# Patient Record
Sex: Female | Born: 1985 | Race: Black or African American | Hispanic: No | Marital: Single | Smoking: Current every day smoker
Health system: Southern US, Community
[De-identification: ages and names within clinical notes are randomized; demographics above are authoritative.]

## PROBLEM LIST (undated history)

## (undated) ENCOUNTER — Inpatient Hospital Stay (HOSPITAL_COMMUNITY): Payer: Self-pay

## (undated) DIAGNOSIS — F1410 Cocaine abuse, uncomplicated: Secondary | ICD-10-CM

## (undated) DIAGNOSIS — K56609 Unspecified intestinal obstruction, unspecified as to partial versus complete obstruction: Secondary | ICD-10-CM

## (undated) DIAGNOSIS — Z5189 Encounter for other specified aftercare: Secondary | ICD-10-CM

## (undated) DIAGNOSIS — E119 Type 2 diabetes mellitus without complications: Secondary | ICD-10-CM

## (undated) DIAGNOSIS — K650 Generalized (acute) peritonitis: Secondary | ICD-10-CM

## (undated) DIAGNOSIS — D649 Anemia, unspecified: Secondary | ICD-10-CM

## (undated) DIAGNOSIS — N83209 Unspecified ovarian cyst, unspecified side: Secondary | ICD-10-CM

## (undated) DIAGNOSIS — F419 Anxiety disorder, unspecified: Secondary | ICD-10-CM

## (undated) DIAGNOSIS — I2699 Other pulmonary embolism without acute cor pulmonale: Secondary | ICD-10-CM

## (undated) DIAGNOSIS — A419 Sepsis, unspecified organism: Secondary | ICD-10-CM

## (undated) DIAGNOSIS — K219 Gastro-esophageal reflux disease without esophagitis: Secondary | ICD-10-CM

## (undated) DIAGNOSIS — K589 Irritable bowel syndrome without diarrhea: Secondary | ICD-10-CM

## (undated) HISTORY — PX: OVARIAN CYST DRAINAGE: SHX325

## (undated) HISTORY — PX: BUNIONECTOMY: SHX129

## (undated) HISTORY — PX: SMALL BOWEL REPAIR: SHX6447

## (undated) HISTORY — PX: APPENDECTOMY: SHX54

---
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
## 2004-04-07 ENCOUNTER — Other Ambulatory Visit: Admission: RE | Admit: 2004-04-07 | Discharge: 2004-04-07 | Payer: Self-pay | Admitting: Internal Medicine

## 2006-08-29 ENCOUNTER — Inpatient Hospital Stay (HOSPITAL_COMMUNITY): Admission: AD | Admit: 2006-08-29 | Discharge: 2006-08-29 | Payer: Self-pay | Admitting: Gynecology

## 2006-11-28 ENCOUNTER — Inpatient Hospital Stay (HOSPITAL_COMMUNITY): Admission: AD | Admit: 2006-11-28 | Discharge: 2006-11-28 | Payer: Self-pay | Admitting: Obstetrics & Gynecology

## 2013-10-18 ENCOUNTER — Inpatient Hospital Stay (HOSPITAL_COMMUNITY): Payer: Self-pay

## 2013-10-18 ENCOUNTER — Encounter (HOSPITAL_COMMUNITY): Payer: Self-pay | Admitting: Family

## 2013-10-18 ENCOUNTER — Inpatient Hospital Stay (HOSPITAL_COMMUNITY)
Admission: AD | Admit: 2013-10-18 | Discharge: 2013-10-18 | Disposition: A | Discharge status: Home / Self Care | Payer: Medicaid Other | Source: Ambulatory Visit | Attending: Obstetrics & Gynecology | Admitting: Obstetrics & Gynecology

## 2013-10-18 DIAGNOSIS — N949 Unspecified condition associated with female genital organs and menstrual cycle: Secondary | ICD-10-CM | POA: Insufficient documentation

## 2013-10-18 DIAGNOSIS — R1032 Left lower quadrant pain: Secondary | ICD-10-CM | POA: Insufficient documentation

## 2013-10-18 DIAGNOSIS — A499 Bacterial infection, unspecified: Secondary | ICD-10-CM | POA: Insufficient documentation

## 2013-10-18 DIAGNOSIS — B9689 Other specified bacterial agents as the cause of diseases classified elsewhere: Secondary | ICD-10-CM | POA: Insufficient documentation

## 2013-10-18 DIAGNOSIS — N760 Acute vaginitis: Secondary | ICD-10-CM

## 2013-10-18 DIAGNOSIS — Z8742 Personal history of other diseases of the female genital tract: Secondary | ICD-10-CM

## 2013-10-18 HISTORY — DX: Unspecified ovarian cyst, unspecified side: N83.209

## 2013-10-18 HISTORY — DX: Generalized (acute) peritonitis: K65.0

## 2013-10-18 HISTORY — DX: Sepsis, unspecified organism: A41.9

## 2013-10-18 LAB — RAPID URINE DRUG SCREEN, HOSP PERFORMED
Barbiturates: NOT DETECTED
Benzodiazepines: NOT DETECTED
Cocaine: NOT DETECTED
Opiates: NOT DETECTED
Tetrahydrocannabinol: POSITIVE — AB

## 2013-10-18 LAB — CBC
HCT: 34.5 % — ABNORMAL LOW (ref 36.0–46.0)
MCHC: 33 g/dL (ref 30.0–36.0)
RDW: 13.3 % (ref 11.5–15.5)

## 2013-10-18 LAB — URINALYSIS, ROUTINE W REFLEX MICROSCOPIC
Glucose, UA: NEGATIVE mg/dL
Hgb urine dipstick: NEGATIVE
Ketones, ur: NEGATIVE mg/dL
Leukocytes, UA: NEGATIVE
Protein, ur: NEGATIVE mg/dL
Specific Gravity, Urine: >1.03 — ABNORMAL HIGH (ref 1.005–1.030)
Urobilinogen, UA: 0.2 mg/dL (ref 0.0–1.0)
pH: 6 (ref 5.0–8.0)

## 2013-10-18 LAB — WET PREP, GENITAL
Trich, Wet Prep: NONE SEEN
Yeast Wet Prep HPF POC: NONE SEEN

## 2013-10-18 LAB — POCT PREGNANCY, URINE: Preg Test, Ur: NEGATIVE

## 2013-10-18 MED ORDER — HYDROCODONE-ACETAMINOPHEN 10-325 MG PO TABS: 1.0000 | ORAL_TABLET | Freq: Four times a day (QID) | ORAL | Status: DC | PRN

## 2013-10-18 MED ORDER — TRAMADOL HCL 50 MG PO TABS: 100.0000 mg | ORAL_TABLET | Freq: Once | ORAL | Status: DC

## 2013-10-18 MED ORDER — OXYCODONE-ACETAMINOPHEN 5-325 MG PO TABS
1.0000 | ORAL_TABLET | Freq: Once | ORAL | Status: AC
  Administered 2013-10-18: 1 via ORAL
  Filled 2013-10-18: qty 1

## 2013-10-18 MED ORDER — KETOROLAC TROMETHAMINE 60 MG/2ML IM SOLN
60.0000 mg | Freq: Once | INTRAMUSCULAR | Status: AC
  Administered 2013-10-18: 60 mg via INTRAMUSCULAR
  Filled 2013-10-18: qty 2

## 2013-10-18 MED ORDER — IBUPROFEN 800 MG PO TABS: 800.0000 mg | ORAL_TABLET | Freq: Three times a day (TID) | ORAL | Status: DC | PRN

## 2013-10-18 MED ORDER — DICYCLOMINE HCL 20 MG PO TABS: 20.0000 mg | ORAL_TABLET | Freq: Three times a day (TID) | ORAL | Status: DC

## 2013-10-18 MED ORDER — DICYCLOMINE HCL 10 MG PO CAPS
20.0000 mg | ORAL_CAPSULE | Freq: Once | ORAL | Status: AC
  Administered 2013-10-18: 20 mg via ORAL
  Filled 2013-10-18: qty 2

## 2013-10-18 MED ORDER — HYDROMORPHONE HCL PF 2 MG/ML IJ SOLN
2.0000 mg | Freq: Once | INTRAMUSCULAR | Status: AC
  Administered 2013-10-18: 2 mg via INTRAMUSCULAR
  Filled 2013-10-18: qty 1

## 2013-10-18 MED ORDER — METRONIDAZOLE 500 MG PO TABS: 500.0000 mg | ORAL_TABLET | Freq: Two times a day (BID) | ORAL | Status: DC

## 2013-10-18 NOTE — MAU Note (Addendum)
27 yo, LMP 12/15, presents to MAU with c/o LLQ pain progressively worsening today from yesterday. Reports hx of L ovarian cyst, which was being monitored every 2-3 weeks when patient lived in [place]. Moved to [place] in last 3 weeks; has not established care here. Reports she is out of pain medication; pain unrelieved by Tylenol. Last medication: Tylenol 1500mg  at 2000 last night. Denies fever, chills. Reports one emesis occurrence last night after taking Tylenol.

## 2013-10-18 NOTE — MAU Note (Addendum)
Discussed smoking cessation; patient expressed readiness to quit smoking at first of year. Reports she only began smoking again 3 months ago and family is supportive of her trying to quit.  Encouraged patient in her plan to quit.

## 2013-10-18 NOTE — MAU Provider Note (Signed)
History     CSN: [id number]  Arrival date and time: 10/18/13 1510   None     No chief complaint on file.  HPI  Ms. Jodi Mcgee is a 27 y.o. female G2P2 who presents with left lower pelvic pain. She had a hemorraghic cyst in August; they aspirated the blood and told her to follow up with her primary care Dr. She followed up with Dr. Julien Girt in [place] following the procedure. He told her that they would continue to monitor it every 3 weeks. She has a significant abdominal history including a ruptured appendix that lead to sepsis; pt has been told she has a large amount of scar tissue throughout her abdomen. She has recently moved from [place] to [place] back to [facility]. She has not established care with any provider here in [place].  She currently rates her pain 9/10; took tylenol without much relief. She has not tried ibuprofen because she knows it wont work.   OB History   Grav Para Term Preterm Abortions TAB SAB Ect Mult Living   2 2        2       Past Medical History  Diagnosis Date  . Ovarian cyst   . Peritonitis, acute generalized   . Sepsis     Past Surgical History  Procedure Laterality Date  . Cesarean section    . Ovarian cyst drainage    . Appendectomy      ruptured     History reviewed. No pertinent family history.  History  Substance Use Topics  . Smoking status: Current Every Day Smoker -- 0.50 packs/day    Types: Cigarettes  . Smokeless tobacco: Never Used  . Alcohol Use: No    Allergies: No Known Allergies  Prescriptions prior to admission  Medication Sig Dispense Refill  . acetaminophen (TYLENOL) 500 MG tablet Take 1,500 mg by mouth every 6 (six) hours as needed for moderate pain.      Marland Kitchen HYDROcodone-acetaminophen (NORCO) 10-325 MG per tablet Take 1 tablet by mouth every 6 (six) hours as needed for moderate pain.      Marland Kitchen ondansetron (ZOFRAN) 4 MG tablet Take 4 mg by mouth every 8 (eight) hours as needed for nausea or vomiting.       Results for  orders placed during the hospital encounter of 10/18/13 (from the past 24 hour(s))  URINALYSIS, ROUTINE W REFLEX MICROSCOPIC     Status: Abnormal   Collection Time    10/18/13  3:55 PM      Result Value Range   Color, Urine YELLOW  YELLOW   APPearance CLEAR  CLEAR   Specific Gravity, Urine >1.030 (*) 1.005 - 1.030   pH 6.0  5.0 - 8.0   Glucose, UA NEGATIVE  NEGATIVE mg/dL   Hgb urine dipstick NEGATIVE  NEGATIVE   Bilirubin Urine NEGATIVE  NEGATIVE   Ketones, ur NEGATIVE  NEGATIVE mg/dL   Protein, ur NEGATIVE  NEGATIVE mg/dL   Urobilinogen, UA 0.2  0.0 - 1.0 mg/dL   Nitrite NEGATIVE  NEGATIVE   Leukocytes, UA NEGATIVE  NEGATIVE  POCT PREGNANCY, URINE     Status: None   Collection Time    10/18/13  4:09 PM      Result Value Range   Preg Test, Ur NEGATIVE  NEGATIVE  WET PREP, GENITAL     Status: Abnormal   Collection Time    10/18/13  5:45 PM      Result Value Range   Yeast  Wet Prep HPF POC NONE SEEN  NONE SEEN   Trich, Wet Prep NONE SEEN  NONE SEEN   Clue Cells Wet Prep HPF POC MODERATE (*) NONE SEEN   WBC, Wet Prep HPF POC FEW (*) NONE SEEN    Review of Systems  Constitutional: Negative for fever and chills.  Gastrointestinal: Positive for nausea, vomiting and abdominal pain.       + left lower quadrant pain.  Genitourinary: Negative for dysuria, urgency, frequency and hematuria.       + vaginal dischargel white discharge  No vaginal bleeding. No dysuria.   Musculoskeletal: Positive for back pain.   Physical Exam   Blood pressure 119/57, pulse 89, temperature 98 F (36.7 C), temperature source Oral, resp. rate 20, height 5\' 6"  (1.676 m), weight 99.791 kg (220 lb), last menstrual period 10/06/2013.  Physical Exam  Constitutional: She is oriented to person, place, and time. She appears well-developed and well-nourished. No distress.  HENT:  Head: Normocephalic.  Eyes: Pupils are equal, round, and reactive to light.  Neck: Neck supple.  GI: Soft. Normal appearance.  There is tenderness in the suprapubic area and left lower quadrant. There is no rigidity, no rebound and no guarding.  Genitourinary:  Speculum exam: Vagina - Small amount of creamy discharge, moderate odor Cervix - No contact bleeding Bimanual exam: Cervix closed, mild CMT  Uterus non tender, normal size Left adnexal tenderness, + suprapubic tenderness, no masses bilaterally GC/Chlam, wet prep done Chaperone present for exam.   Neurological: She is alert and oriented to person, place, and time.  Skin: Skin is warm. She is not diaphoretic.  Psychiatric: Her behavior is normal.    MAU Course  Procedures None  MDM Toradol 60 mg IM Pt says when she has gone to the ER in the past she gotten dilaudid and morphine. I explained to the patient that Toradol is a great non-narcotic drug that works well for pelvic pain.  Pt requests something stronger for pain; percocet 1 tab given Pelvic US complete UDS 1900: pt rates her pain the same; no relief from Toradol or percocet. Pt is requesting a dose of dilaudid.   Assessment and Plan  Report given to [place] CNM who assumes care of the patient   Debbrah Alar 10/18/2013, 4:57 PM   Dorathy Kinsman, CNM assumed care of pt at 2000. Pt in [place].   Results for orders placed during the hospital encounter of 10/18/13 (from the past 24 hour(s))  URINALYSIS, ROUTINE W REFLEX MICROSCOPIC     Status: Abnormal   Collection Time    10/18/13  3:55 PM      Result Value Range   Color, Urine YELLOW  YELLOW   APPearance CLEAR  CLEAR   Specific Gravity, Urine >1.030 (*) 1.005 - 1.030   pH 6.0  5.0 - 8.0   Glucose, UA NEGATIVE  NEGATIVE mg/dL   Hgb urine dipstick NEGATIVE  NEGATIVE   Bilirubin Urine NEGATIVE  NEGATIVE   Ketones, ur NEGATIVE  NEGATIVE mg/dL   Protein, ur NEGATIVE  NEGATIVE mg/dL   Urobilinogen, UA 0.2  0.0 - 1.0 mg/dL   Nitrite NEGATIVE  NEGATIVE   Leukocytes, UA NEGATIVE  NEGATIVE  URINE RAPID DRUG SCREEN (HOSP  PERFORMED)     Status: Abnormal   Collection Time    10/18/13  3:55 PM      Result Value Range   Opiates NONE DETECTED  NONE DETECTED   Cocaine NONE DETECTED  NONE DETECTED  Benzodiazepines NONE DETECTED  NONE DETECTED   Amphetamines NONE DETECTED  NONE DETECTED   Tetrahydrocannabinol POSITIVE (*) NONE DETECTED   Barbiturates NONE DETECTED  NONE DETECTED  POCT PREGNANCY, URINE     Status: None   Collection Time    10/18/13  4:09 PM      Result Value Range   Preg Test, Ur NEGATIVE  NEGATIVE  WET PREP, GENITAL     Status: Abnormal   Collection Time    10/18/13  5:45 PM      Result Value Range   Yeast Wet Prep HPF POC NONE SEEN  NONE SEEN   Trich, Wet Prep NONE SEEN  NONE SEEN   Clue Cells Wet Prep HPF POC MODERATE (*) NONE SEEN   WBC, Wet Prep HPF POC FEW (*) NONE SEEN  CBC     Status: Abnormal   Collection Time    10/18/13  8:40 PM      Result Value Range   WBC 4.9  4.0 - 10.5 K/uL   RBC 4.18  3.87 - 5.11 MIL/uL   Hemoglobin 11.4 (*) 12.0 - 15.0 g/dL   HCT 16.1 (*) 09.6 - 04.5 %   MCV 82.5  78.0 - 100.0 fL   MCH 27.3  26.0 - 34.0 pg   MCHC 33.0  30.0 - 36.0 g/dL   RDW 40.9  81.1 - 91.4 %   Platelets 264  150 - 400 K/uL    Discussed uncertain Gyn etiology for pain, but no clear Gyn cause. Per consult w/ Dr.Leggett may give Dilaudid x 1 in MAU and F/U w/ ED for ongoing problems. Will also give Bentyl due to location of pain and Hx IBS. Will Tx BV. Pt agreeable.  Assessment:  1. LLQ abdominal pain   2. Hx of ovarian cyst   3. BV (bacterial vaginosis)    Plan: D/C home in stable condition per consult w/ Dr Penne Lash. Comfort measures. List of Primary care and Gyn providers given. Follow-up Information   Follow up with Primary care provider. (As needed if symptoms worsen)       Follow up with [facility]. (As needed in emergencies)    Contact information:   [address] [place] [address]        Medication List         acetaminophen 500 MG tablet   Commonly known as:  TYLENOL  Take 1,500 mg by mouth every 6 (six) hours as needed for moderate pain.     dicyclomine 20 MG tablet  Commonly known as:  BENTYL  Take 1 tablet (20 mg total) by mouth 4 (four) times daily -  before meals and at bedtime.     HYDROcodone-acetaminophen 10-325 MG per tablet  Commonly known as:  NORCO  Take 1 tablet by mouth every 6 (six) hours as needed for moderate pain.     ibuprofen 800 MG tablet  Commonly known as:  ADVIL,MOTRIN  Take 1 tablet (800 mg total) by mouth every 8 (eight) hours as needed for moderate pain. Take on schedule for 48 hours.     metroNIDAZOLE 500 MG tablet  Commonly known as:  FLAGYL  Take 1 tablet (500 mg total) by mouth 2 (two) times daily.     ondansetron 4 MG tablet  Commonly known as:  ZOFRAN  Take 4 mg by mouth every 8 (eight) hours as needed for nausea or vomiting.       [place], [place] 10/18/2013 9:53 PM

## 2013-10-18 NOTE — MAU Note (Signed)
Pt states here for left sided lower abdominal pain. Has had ovarian cyst to rupture on left side. Constant pain since yesterday that has gotten severe. Since having ruptured appendix and ovarian abscesses, pt states she has had pain and problems with ovaries.

## 2013-10-20 ENCOUNTER — Emergency Department (HOSPITAL_COMMUNITY): Payer: Self-pay

## 2013-10-20 ENCOUNTER — Emergency Department (HOSPITAL_COMMUNITY)
Admission: EM | Admit: 2013-10-20 | Discharge: 2013-10-20 | Disposition: A | Discharge status: Home / Self Care | Payer: Self-pay | Attending: Emergency Medicine | Admitting: Emergency Medicine

## 2013-10-20 ENCOUNTER — Encounter (HOSPITAL_COMMUNITY): Payer: Self-pay | Admitting: Emergency Medicine

## 2013-10-20 DIAGNOSIS — Z8742 Personal history of other diseases of the female genital tract: Secondary | ICD-10-CM | POA: Insufficient documentation

## 2013-10-20 DIAGNOSIS — F17200 Nicotine dependence, unspecified, uncomplicated: Secondary | ICD-10-CM | POA: Insufficient documentation

## 2013-10-20 DIAGNOSIS — Z79899 Other long term (current) drug therapy: Secondary | ICD-10-CM | POA: Insufficient documentation

## 2013-10-20 DIAGNOSIS — Z8619 Personal history of other infectious and parasitic diseases: Secondary | ICD-10-CM | POA: Insufficient documentation

## 2013-10-20 DIAGNOSIS — R197 Diarrhea, unspecified: Secondary | ICD-10-CM | POA: Insufficient documentation

## 2013-10-20 DIAGNOSIS — Z3202 Encounter for pregnancy test, result negative: Secondary | ICD-10-CM | POA: Insufficient documentation

## 2013-10-20 DIAGNOSIS — R141 Gas pain: Secondary | ICD-10-CM | POA: Insufficient documentation

## 2013-10-20 DIAGNOSIS — Z9889 Other specified postprocedural states: Secondary | ICD-10-CM | POA: Insufficient documentation

## 2013-10-20 DIAGNOSIS — R142 Eructation: Secondary | ICD-10-CM | POA: Insufficient documentation

## 2013-10-20 DIAGNOSIS — K5900 Constipation, unspecified: Secondary | ICD-10-CM | POA: Insufficient documentation

## 2013-10-20 DIAGNOSIS — R1110 Vomiting, unspecified: Secondary | ICD-10-CM | POA: Insufficient documentation

## 2013-10-20 LAB — BASIC METABOLIC PANEL
CO2: 25 mEq/L (ref 19–32)
GFR calc Af Amer: >90 mL/min (ref >90)
GFR calc non Af Amer: >90 mL/min (ref >90)
Glucose, Bld: 91 mg/dL (ref 70–99)
Potassium: 4.1 mEq/L (ref 3.5–5.1)
Sodium: 134 mEq/L — ABNORMAL LOW (ref 135–145)

## 2013-10-20 LAB — POCT PREGNANCY, URINE: Preg Test, Ur: NEGATIVE

## 2013-10-20 LAB — URINALYSIS, ROUTINE W REFLEX MICROSCOPIC
Glucose, UA: NEGATIVE mg/dL
Leukocytes, UA: NEGATIVE
Nitrite: NEGATIVE
Protein, ur: NEGATIVE mg/dL
Specific Gravity, Urine: 1.021 (ref 1.005–1.030)
Urobilinogen, UA: 1 mg/dL (ref 0.0–1.0)

## 2013-10-20 LAB — HEPATIC FUNCTION PANEL
Albumin: 3.5 g/dL (ref 3.5–5.2)
Alkaline Phosphatase: 67 U/L (ref 39–117)
Bilirubin, Direct: <0.1 mg/dL (ref 0.0–0.3)
Total Bilirubin: 0.2 mg/dL — ABNORMAL LOW (ref 0.3–1.2)

## 2013-10-20 LAB — CBC WITH DIFFERENTIAL/PLATELET
Basophils Absolute: 0 10*3/uL (ref 0.0–0.1)
Eosinophils Absolute: 0.1 10*3/uL (ref 0.0–0.7)
Lymphocytes Relative: 30 % (ref 12–46)
Lymphs Abs: 1.5 10*3/uL (ref 0.7–4.0)
MCH: 26.8 pg (ref 26.0–34.0)
MCV: 83.3 fL (ref 78.0–100.0)
Neutrophils Relative %: 61 % (ref 43–77)
Platelets: 235 10*3/uL (ref 150–400)
RBC: 4.37 MIL/uL (ref 3.87–5.11)
WBC: 5 10*3/uL (ref 4.0–10.5)

## 2013-10-20 LAB — GC/CHLAMYDIA PROBE AMP: CT Probe RNA: NEGATIVE

## 2013-10-20 LAB — LIPASE, BLOOD: Lipase: 26 U/L (ref 11–59)

## 2013-10-20 MED ORDER — BISACODYL 10 MG RE SUPP
20.0000 mg | Freq: Once | RECTAL | Status: AC
  Administered 2013-10-20: 20 mg via RECTAL
  Filled 2013-10-20: qty 2

## 2013-10-20 MED ORDER — DOCUSATE SODIUM 100 MG PO CAPS: 100.0000 mg | ORAL_CAPSULE | Freq: Two times a day (BID) | ORAL | Status: DC

## 2013-10-20 MED ORDER — HYDROMORPHONE HCL PF 1 MG/ML IJ SOLN
1.0000 mg | Freq: Once | INTRAMUSCULAR | Status: AC
  Administered 2013-10-20: 1 mg via INTRAVENOUS
  Filled 2013-10-20: qty 1

## 2013-10-20 MED ORDER — FLEET ENEMA 7-19 GM/118ML RE ENEM
1.0000 | ENEMA | Freq: Once | RECTAL | Status: AC
  Administered 2013-10-20: 1 via RECTAL
  Filled 2013-10-20: qty 1

## 2013-10-20 MED ORDER — POLYETHYLENE GLYCOL 3350 17 GM/SCOOP PO POWD: 17.0000 g | Freq: Three times a day (TID) | ORAL | Status: DC

## 2013-10-20 NOTE — ED Notes (Signed)
Patient transported to X-ray 

## 2013-10-20 NOTE — ED Notes (Signed)
Pt presents with c/o abdominal pain that started three days ago. Pt was seen at [facility] hospital for the same two days ago. Pt says she had abdominal surgery last year and ever since then has had some intestinal problems. Pt says she has had nausea and vomiting and some diarrhea.

## 2013-10-20 NOTE — ED Provider Notes (Signed)
CSN: [id number]     Arrival date & time 10/20/13  1453 History   First MD Initiated Contact with Patient 10/20/13 1746     Chief Complaint  Patient presents with  . Abdominal Pain   (Consider location/radiation/quality/duration/timing/severity/associated sxs/prior Treatment) HPI 27 yo female presents with generalized abdominal pain x 3-4 days. Patient states pain is constant at a 7/10 with sharp 9/10 pain that occurs at regular 20-30 min intervals. Pain is worse with twisting, improved when she lays on right side. Pain also worsened with food. Patient tried Norco and Ibuprofen with no relief of sxs. Admits to 2 episodes of vomiting without hematemesis that occurred after eating. Admits 3 episodes of diarrhea and some mid abdominal bloating.  Denies dysuria, hematuria, bloody stools. No vaginal pain, discharge, or bleeding. LMP on 10/06/13 and appears to be normal. PMH significant for multiple abdominal surgeries, rupture appendicitis with appendectomy.  Patient states she has hx of ovarian cysts and was seen on 10/19/11 at the [facility] hospital where they performed a pelvic US that showed no ovarian cysts or free fluid. Pelvic exam and additional labs show Negative G/C, Negative urine preg,  positive for BV. Patient currently on tx for BV.   Past Medical History  Diagnosis Date  . Ovarian cyst   . Peritonitis, acute generalized   . Sepsis    Past Surgical History  Procedure Laterality Date  . Cesarean section    . Ovarian cyst drainage    . Appendectomy      ruptured    No family history on file. History  Substance Use Topics  . Smoking status: Current Every Day Smoker -- 0.50 packs/day    Types: Cigarettes  . Smokeless tobacco: Never Used  . Alcohol Use: No   OB History   Grav Para Term Preterm Abortions TAB SAB Ect Mult Living   2 2        2      Review of Systems  Constitutional: Negative for fever and chills.  Respiratory: Negative for shortness of breath.    Cardiovascular: Negative for chest pain.  Genitourinary: Negative for flank pain.  All other systems reviewed and are negative.    Allergies  Review of patient's allergies indicates no known allergies.  Home Medications   Current Outpatient Rx  Name  Route  Sig  Dispense  Refill  . acetaminophen (TYLENOL) 500 MG tablet   Oral   Take 1,500 mg by mouth every 6 (six) hours as needed for moderate pain.         Marland Kitchen dicyclomine (BENTYL) 20 MG tablet   Oral   Take 1 tablet (20 mg total) by mouth 4 (four) times daily -  before meals and at bedtime.   30 tablet   1   . HYDROcodone-acetaminophen (NORCO) 10-325 MG per tablet   Oral   Take 1 tablet by mouth every 6 (six) hours as needed for moderate pain.   15 tablet   0   . ibuprofen (ADVIL,MOTRIN) 800 MG tablet   Oral   Take 1 tablet (800 mg total) by mouth every 8 (eight) hours as needed for moderate pain. Take on schedule for 48 hours.   30 tablet   1   . metroNIDAZOLE (FLAGYL) 500 MG tablet   Oral   Take 1 tablet (500 mg total) by mouth 2 (two) times daily.   14 tablet   0   . ondansetron (ZOFRAN) 4 MG tablet   Oral   Take 4 mg  by mouth every 8 (eight) hours as needed for nausea or vomiting.         . docusate sodium (COLACE) 100 MG capsule   Oral   Take 1 capsule (100 mg total) by mouth every 12 (twelve) hours.   30 capsule   0   . polyethylene glycol powder (GLYCOLAX/MIRALAX) powder   Oral   Take 17 g by mouth 3 (three) times daily. Until daily soft stools  OTC   255 g   0    BP 115/69  Pulse 62  Temp(Src) 98.2 F (36.8 C) (Oral)  Resp 14  SpO2 100%  LMP 10/06/2013 Physical Exam  Nursing note and vitals reviewed. Constitutional: She is oriented to person, place, and time. She appears well-developed and well-nourished. No distress.  HENT:  Head: Normocephalic and atraumatic.  Cardiovascular: Normal rate and regular rhythm.  Exam reveals no gallop and no friction rub.   No murmur  heard. Pulmonary/Chest: Effort normal and breath sounds normal. No respiratory distress. She has no wheezes. She has no rales.  Abdominal: Soft. Bowel sounds are normal. She exhibits distension. She exhibits no mass. There is no hepatosplenomegaly. There is tenderness in the periumbilical area and left lower quadrant. There is guarding. There is no rigidity, no rebound, no CVA tenderness, no tenderness at McBurney's point and negative Murphy's sign.  Musculoskeletal: Normal range of motion. She exhibits no edema.  Neurological: She is alert and oriented to person, place, and time.  Skin: Skin is warm and dry. She is not diaphoretic.  Psychiatric: She has a normal mood and affect. Her behavior is normal.    ED Course  Procedures (including critical care time) Labs Review Labs Reviewed  CBC WITH DIFFERENTIAL - Abnormal; Notable for the following:    Hemoglobin 11.7 (*)    All other components within normal limits  BASIC METABOLIC PANEL - Abnormal; Notable for the following:    Sodium 134 (*)    All other components within normal limits  URINALYSIS, ROUTINE W REFLEX MICROSCOPIC - Abnormal; Notable for the following:    APPearance CLOUDY (*)    All other components within normal limits  HEPATIC FUNCTION PANEL - Abnormal; Notable for the following:    Total Bilirubin 0.2 (*)    All other components within normal limits  LIPASE, BLOOD  POCT PREGNANCY, URINE   Imaging Review Dg Abd Acute W/chest  10/20/2013   CLINICAL DATA:  Abdominal pain, history of appendectomy and ovarian cyst  EXAM: ACUTE ABDOMEN SERIES (ABDOMEN 2 VIEW & CHEST 1 VIEW)  COMPARISON:  None.  FINDINGS: Examination is degraded due to patient body habitus.  Normal cardiac silhouette and mediastinal contours given decreased lung volumes. Minimal bilateral perihilar opacities, right greater than left, likely atelectasis. There is mild elevation the right hemidiaphragm. No definite pleural effusion pneumothorax. No evidence of  edema.  Moderate to large colonic stool burden without evidence of obstruction. No pneumoperitoneum, pneumatosis or portal venous gas.  No definite abnormal intra-abdominal calcifications.  A surgical clip overlies the left lower abdominal quadrant.  No acute osseus abnormalities.  IMPRESSION: 1. No acute cardiopulmonary disease on this hypoventilated examination. 2. Moderate to large colonic stool burden without evidence of obstruction.   Electronically Signed   By: Simonne Come M.D.   On: 10/20/2013 18:53    EKG Interpretation   None       MDM   1. Constipation    Low hgb, improved compared prior study. Labs otherwise unremarkable. UA negative for  UTI. Acute abdominal series shows large colonic stool burden without evidence of obstruction.  Patient treated in ED with Fleet enema and dulcolax suppository.   Discussed labs, and exam findings with patient.  Recommend take Miralax, 3 glasses per day until you have regular soft bowel movements.Advise follow up with PCP for maintenance of chronic constipation.Recommend follow up in 1 week. Avoid narcotic pain medication use due to increase risk for constipation. Drink plenty of caffeine free fluids and plenty of High fiber foods (fruits, whole grains). Resource guide provided for follow up and handouts given on high fiber diet. Patient agrees with plan. Discharged in good condition.   Meds given in ED:  Medications  HYDROmorphone (DILAUDID) injection 1 mg (1 mg Intravenous Given 10/20/13 1835)  sodium phosphate (FLEET) 7-19 GM/118ML enema 1 enema (1 enema Rectal Given 10/20/13 2015)  bisacodyl (DULCOLAX) suppository 20 mg (20 mg Rectal Given 10/20/13 2138)    Discharge Medication List as of 10/20/2013  9:11 PM        Rudene Anda, PA-C 10/21/13 [phone]

## 2013-10-20 NOTE — ED Notes (Signed)
PA at bedside.

## 2013-10-20 NOTE — ED Notes (Addendum)
Lab called by RN and hepatic function panel added onto blood work.

## 2013-10-22 NOTE — MAU Provider Note (Signed)
Attestation of Attending Supervision of Advanced Practitioner (CNM/NP): Evaluation and management procedures were performed by the Advanced Practitioner under my supervision and collaboration. I have reviewed the Advanced Practitioner's note and chart, and I agree with the management and plan.  Brooklyne Radke H. 5:17 PM

## 2013-10-23 NOTE — ED Provider Notes (Signed)
Medical screening examination/treatment/procedure(s) were performed by non-physician practitioner and as supervising physician I was immediately available for consultation/collaboration.   Dot Lanes, MD 10/23/13 1051

## 2013-11-16 ENCOUNTER — Emergency Department (HOSPITAL_COMMUNITY)
Admission: EM | Admit: 2013-11-16 | Discharge: 2013-11-17 | Disposition: A | Discharge status: Home / Self Care | Payer: Self-pay | Attending: Emergency Medicine | Admitting: Emergency Medicine

## 2013-11-16 DIAGNOSIS — R109 Unspecified abdominal pain: Secondary | ICD-10-CM | POA: Insufficient documentation

## 2013-11-16 DIAGNOSIS — R062 Wheezing: Secondary | ICD-10-CM | POA: Insufficient documentation

## 2013-11-16 DIAGNOSIS — F17200 Nicotine dependence, unspecified, uncomplicated: Secondary | ICD-10-CM | POA: Insufficient documentation

## 2013-11-16 DIAGNOSIS — K5900 Constipation, unspecified: Secondary | ICD-10-CM | POA: Insufficient documentation

## 2013-11-17 ENCOUNTER — Encounter (HOSPITAL_COMMUNITY): Payer: Self-pay | Admitting: Emergency Medicine

## 2013-11-17 ENCOUNTER — Emergency Department (HOSPITAL_COMMUNITY): Payer: Self-pay

## 2013-11-17 LAB — COMPREHENSIVE METABOLIC PANEL
ALK PHOS: 69 U/L (ref 39–117)
ALT: 16 U/L (ref 0–35)
AST: 19 U/L (ref 0–37)
Albumin: 3.6 g/dL (ref 3.5–5.2)
BUN: 14 mg/dL (ref 6–23)
CO2: 21 meq/L (ref 19–32)
Calcium: 8.7 mg/dL (ref 8.4–10.5)
Chloride: 102 mEq/L (ref 96–112)
Creatinine, Ser: 0.63 mg/dL (ref 0.50–1.10)
GLUCOSE: 120 mg/dL — AB (ref 70–99)
POTASSIUM: 3.7 meq/L (ref 3.7–5.3)
SODIUM: 139 meq/L (ref 137–147)
Total Bilirubin: <0.2 mg/dL — ABNORMAL LOW (ref 0.3–1.2)
Total Protein: 7.6 g/dL (ref 6.0–8.3)

## 2013-11-17 LAB — CBC
HCT: 37.6 % (ref 36.0–46.0)
HEMOGLOBIN: 12.1 g/dL (ref 12.0–15.0)
MCH: 26.9 pg (ref 26.0–34.0)
MCHC: 32.2 g/dL (ref 30.0–36.0)
MCV: 83.7 fL (ref 78.0–100.0)
PLATELETS: 278 10*3/uL (ref 150–400)
RBC: 4.49 MIL/uL (ref 3.87–5.11)
RDW: 13.7 % (ref 11.5–15.5)
WBC: 5.9 10*3/uL (ref 4.0–10.5)

## 2013-11-17 LAB — LIPASE, BLOOD: Lipase: 28 U/L (ref 11–59)

## 2013-11-17 MED ORDER — ALBUTEROL SULFATE (2.5 MG/3ML) 0.083% IN NEBU
5.0000 mg | INHALATION_SOLUTION | Freq: Once | RESPIRATORY_TRACT | Status: AC
  Administered 2013-11-17: 5 mg via RESPIRATORY_TRACT
  Filled 2013-11-17: qty 6

## 2013-11-17 MED ORDER — POLYETHYLENE GLYCOL 3350 17 GM/SCOOP PO POWD: 17.0000 g | Freq: Every day | ORAL | Status: DC

## 2013-11-17 MED ORDER — ALBUTEROL SULFATE HFA 108 (90 BASE) MCG/ACT IN AERS: 1.0000 | INHALATION_SPRAY | Freq: Four times a day (QID) | RESPIRATORY_TRACT | Status: DC | PRN

## 2013-11-17 MED ORDER — OXYCODONE-ACETAMINOPHEN 5-325 MG PO TABS
2.0000 | ORAL_TABLET | Freq: Once | ORAL | Status: AC
  Administered 2013-11-17: 2 via ORAL
  Filled 2013-11-17: qty 2

## 2013-11-17 NOTE — ED Provider Notes (Signed)
Medical screening examination/treatment/procedure(s) were performed by non-physician practitioner and as supervising physician I was immediately available for consultation/collaboration.  Teressa Lower, MD 11/17/13 [phone]

## 2013-11-17 NOTE — ED Notes (Signed)
Pt arrived to the ED with a complaint of abdominal pain.  Pt states the pain is on the left side of the abdomen.  Pt states she has surgery a year ago and has had complications since.  Pt states pain has been present for two weeks.

## 2013-11-17 NOTE — ED Provider Notes (Signed)
CSN: [id number]     Arrival date & time 11/16/13  2329 History   First MD Initiated Contact with Patient 11/17/13 0354     Chief Complaint  Patient presents with  . Abdominal Pain   HPI  History provided by the patient. Patient is a 28 year old female with past history of ruptured appendix and peritonitis, ovarian cysts and cesarean section who presents with complaints of continued left-sided abdominal pain. Patient reports having ongoing left-sided abdominal pain and cramps. She states that this has been going on for the past several weeks to months. She does report some periods of improvement but generally has continued pain and symptoms. She was evaluated in December in the emergency department for symptoms and told that she may have some constipation. She has been using MiraLax and Colace but does admit to some inconsistency with using these on some days. She reports normal soft bowel movements daily and occasionally multiple times a day. She denies any hard bowel movements or straining. Denies any diarrhea. No nausea, vomiting, fever, chills or sweats. Pain does seem worse with some positions and occasionally worse with cough. She denies any chest pain or shortness of breath. No other aggravating or alleviating factors. No other associated symptoms.    Past Medical History  Diagnosis Date  . Ovarian cyst   . Peritonitis, acute generalized   . Sepsis    Past Surgical History  Procedure Laterality Date  . Cesarean section    . Ovarian cyst drainage    . Appendectomy      ruptured    History reviewed. No pertinent family history. History  Substance Use Topics  . Smoking status: Current Every Day Smoker -- 0.50 packs/day    Types: Cigarettes  . Smokeless tobacco: Never Used  . Alcohol Use: No   OB History   Grav Para Term Preterm Abortions TAB SAB Ect Mult Living   2 2        2      Review of Systems  Constitutional: Negative for fever, chills and diaphoresis.    Gastrointestinal: Positive for abdominal pain. Negative for nausea, vomiting, diarrhea and constipation.  Genitourinary: Negative for dysuria, frequency, hematuria and flank pain.  All other systems reviewed and are negative.    Allergies  Review of patient's allergies indicates no known allergies.  Home Medications   Current Outpatient Rx  Name  Route  Sig  Dispense  Refill  . acetaminophen (TYLENOL) 500 MG tablet   Oral   Take 1,500 mg by mouth every 6 (six) hours as needed for moderate pain.         Marland Kitchen dicyclomine (BENTYL) 20 MG tablet   Oral   Take 1 tablet (20 mg total) by mouth 4 (four) times daily -  before meals and at bedtime.   30 tablet   1   . docusate sodium (COLACE) 100 MG capsule   Oral   Take 1 capsule (100 mg total) by mouth every 12 (twelve) hours.   30 capsule   0   . HYDROcodone-acetaminophen (NORCO) 10-325 MG per tablet   Oral   Take 1 tablet by mouth every 6 (six) hours as needed for moderate pain.   15 tablet   0   . ibuprofen (ADVIL,MOTRIN) 800 MG tablet   Oral   Take 1 tablet (800 mg total) by mouth every 8 (eight) hours as needed for moderate pain. Take on schedule for 48 hours.   30 tablet   1   .  metroNIDAZOLE (FLAGYL) 500 MG tablet   Oral   Take 1 tablet (500 mg total) by mouth 2 (two) times daily.   14 tablet   0   . ondansetron (ZOFRAN) 4 MG tablet   Oral   Take 4 mg by mouth every 8 (eight) hours as needed for nausea or vomiting.         . polyethylene glycol powder (GLYCOLAX/MIRALAX) powder   Oral   Take 17 g by mouth 3 (three) times daily. Until daily soft stools  OTC   255 g   0    BP 117/80  Pulse 94  Temp(Src) 98.9 F (37.2 C) (Oral)  Resp 18  Ht 5\' 6"  (1.676 m)  Wt 196 lb (88.905 kg)  BMI 31.65 kg/m2  SpO2 100%  LMP 11/06/2013 Physical Exam  Nursing note and vitals reviewed. Constitutional: She is oriented to person, place, and time. She appears well-developed and well-nourished. No distress.  HENT:   Head: Normocephalic.  Cardiovascular: Normal rate and regular rhythm.   Pulmonary/Chest: Effort normal. No respiratory distress. She has wheezes. She has no rales.  Abdominal: Soft. There is tenderness. There is no rigidity, no rebound, no guarding, no CVA tenderness, no tenderness at McBurney's point and negative Murphy's sign.    Neurological: She is alert and oriented to person, place, and time.  Skin: Skin is warm and dry. No rash noted.  Psychiatric: She has a normal mood and affect. Her behavior is normal.    ED Course  Procedures    DIAGNOSTIC STUDIES: Oxygen Saturation is 100% on room air.    COORDINATION OF CARE:  Nursing notes reviewed. Vital signs reviewed. Initial pt interview and examination performed.   4:05 AM-patient seen and evaluated. She appears well in no acute distress. Does not appear in severe pain or discomfort. Discussed work up plan with pt at bedside, which includes lab testing and x-rays. Pt agrees with plan.  Patient having some improvement of pain after medicines. Her lab testing is normal today without any concerning findings. X-rays today do demonstrate a large amount of stool in the colon. They appear similar to x-rays in December. Patient is afebrile. Doubt any infectious process. She has a soft abdomen without peritoneal signs. Doubt any acute abdominal process. I discussed the findings with the patient and reviewed the x-rays in her room. We discussed the need for more aggressive treatment of her constipation. We also discussed the possible need for GI followup. Patient expressed her understanding and agrees with the plan. Patient also having significant improvement of her wheezing which have resolved after Benadryl treatment. I did counsel her extensively on the need to quit smoking. We'll provide an albuterol inhaler for any additional wheezing  I did also discuss with the patient my recommendations do not use any narcotic medications as this may be  adding to her complications and pain. I also discussed that today I do not feel she required any CT scan for further evaluation today as she has a generally benign abdominal exam and unremarkable lab tests. I did advise patient to followup in 2-3 days to see if she has any improvement of symptoms and may return for reevaluation if she is not feeling better.   Results for orders placed during the hospital encounter of 11/16/13  CBC      Result Value Range   WBC 5.9  4.0 - 10.5 K/uL   RBC 4.49  3.87 - 5.11 MIL/uL   Hemoglobin 12.1  12.0 - 15.0  g/dL   HCT 37.6  36.0 - 46.0 %   MCV 83.7  78.0 - 100.0 fL   MCH 26.9  26.0 - 34.0 pg   MCHC 32.2  30.0 - 36.0 g/dL   RDW 13.7  11.5 - 15.5 %   Platelets 278  150 - 400 K/uL  COMPREHENSIVE METABOLIC PANEL      Result Value Range   Sodium 139  137 - 147 mEq/L   Potassium 3.7  3.7 - 5.3 mEq/L   Chloride 102  96 - 112 mEq/L   CO2 21  19 - 32 mEq/L   Glucose, Bld 120 (*) 70 - 99 mg/dL   BUN 14  6 - 23 mg/dL   Creatinine, Ser 0.63  0.50 - 1.10 mg/dL   Calcium 8.7  8.4 - 10.5 mg/dL   Total Protein 7.6  6.0 - 8.3 g/dL   Albumin 3.6  3.5 - 5.2 g/dL   AST 19  0 - 37 U/L   ALT 16  0 - 35 U/L   Alkaline Phosphatase 69  39 - 117 U/L   Total Bilirubin <0.2 (*) 0.3 - 1.2 mg/dL   GFR calc non Af Amer >90  >90 mL/min   GFR calc Af Amer >90  >90 mL/min  LIPASE, BLOOD      Result Value Range   Lipase 28  11 - 59 U/L       Imaging Review Dg Chest 2 View  11/17/2013   CLINICAL DATA:  Worsening abdominal pain.  EXAM: CHEST  2 VIEW  COMPARISON:  Acute abdominal series October 20, 2013  FINDINGS: Cardiomediastinal silhouette is unremarkable. The lungs are clear without pleural effusions or focal consolidations. Pulmonary vasculature is unremarkable. Trachea projects midline and there is no pneumothorax. Soft tissue planes and included osseous structures are nonsuspicious.  IMPRESSION: No active cardiopulmonary disease.   Electronically Signed   By: Elon Alas   On: 11/17/2013 04:48   Dg Abd 1 View  11/17/2013   CLINICAL DATA:  History of ruptured appendix, with persistent abdominal pain.  EXAM: ABDOMEN - 1 VIEW  COMPARISON:  Acute abdominal series October 20, 2013  FINDINGS: The bowel gas pattern is nondilated and nonobstructive. Surgical clip projects over the left iliac wing it was present previously. Moderate amount of retained large bowel stool. No intra-abdominal mass effect or pathologic calcifications. No definite free air though, the lung bases are not completely imaged. 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 Soft tissue planes and included osseous structures are otherwise unremarkable.  IMPRESSION: Moderate amount of retained large bowel stool with nonobstructive bowel gas pattern.   Electronically Signed   By: Elon Alas   On: 11/17/2013 04:50    EKG Interpretation   None       MDM   1. Abdominal pain   2. Constipation         Martie Lee, PA-C 11/17/13 [phone]

## 2013-11-17 NOTE — Discharge Instructions (Signed)
You were seen and evaluated for your abdominal pain and discomfort. Your testing and x-rays did not show any signs for an emergent condition. You were found to have a large amount of stool within your colon and your providers feel this may be adding to your pain and discomfort. Please use MiraLax as discussed. Drink plenty of water to stay hydrated. Followup with a primary care provider or gastroenterology specialists for continued evaluation and treatment. Have a recheck of your symptoms in 2-3 days. Return sooner if you have any changing or worsening symptoms.   Abdominal Pain, Adult Many things can cause belly (abdominal) pain. Most times, the belly pain is not dangerous. Many cases of belly pain can be watched and treated at home. HOME CARE   Do not take medicines that help you go poop (laxatives) unless told to by your doctor.  Only take medicine as told by your doctor.  Eat or drink as told by your doctor. Your doctor will tell you if you should be on a special diet. GET HELP IF:  You do not know what is causing your belly pain.  You have belly pain while you are sick to your stomach (nauseous) or have runny poop (diarrhea).  You have pain while you pee or poop.  Your belly pain wakes you up at night.  You have belly pain that gets worse or better when you eat.  You have belly pain that gets worse when you eat fatty foods. GET HELP RIGHT AWAY IF:   The pain does not go away within 2 hours.  You have a fever.  You keep throwing up (vomiting).  The pain changes and is only in the right or left part of the belly.  You have bloody or tarry looking poop. MAKE SURE YOU:   Understand these instructions.  Will watch your condition.  Will get help right away if you are not doing well or get worse. Document Released: 03/27/2008 Document Revised: 07/30/2013 Document Reviewed: 06/18/2013 [facility] Patient Information 2014 [place].   Constipation, Adult Constipation is  when a person has fewer than 3 bowel movements a week; has difficulty having a bowel movement; or has stools that are dry, hard, or larger than normal. As people grow older, constipation is more common. If you try to fix constipation with medicines that make you have a bowel movement (laxatives), the problem may get worse. Long-term laxative use may cause the muscles of the colon to become weak. A low-fiber diet, not taking in enough fluids, and taking certain medicines may make constipation worse. CAUSES   Certain medicines, such as antidepressants, pain medicine, iron supplements, antacids, and water pills.   Certain diseases, such as diabetes, irritable bowel syndrome (IBS), thyroid disease, or depression.   Not drinking enough water.   Not eating enough fiber-rich foods.   Stress or travel.  Lack of physical activity or exercise.  Not going to the restroom when there is the urge to have a bowel movement.  Ignoring the urge to have a bowel movement.  Using laxatives too much. SYMPTOMS   Having fewer than 3 bowel movements a week.   Straining to have a bowel movement.   Having hard, dry, or larger than normal stools.   Feeling full or bloated.   Pain in the lower abdomen.  Not feeling relief after having a bowel movement. DIAGNOSIS  Your caregiver will take a medical history and perform a physical exam. Further testing may be done for severe constipation.  Some tests may include:   A barium enema X-ray to examine your rectum, colon, and sometimes, your small intestine.  A sigmoidoscopy to examine your lower colon.  A colonoscopy to examine your entire colon. TREATMENT  Treatment will depend on the severity of your constipation and what is causing it. Some dietary treatments include drinking more fluids and eating more fiber-rich foods. Lifestyle treatments may include regular exercise. If these diet and lifestyle recommendations do not help, your caregiver may  recommend taking over-the-counter laxative medicines to help you have bowel movements. Prescription medicines may be prescribed if over-the-counter medicines do not work.  HOME CARE INSTRUCTIONS   Increase dietary fiber in your diet, such as fruits, vegetables, whole grains, and beans. Limit high-fat and processed sugars in your diet, such as [place] fries, hamburgers, cookies, candies, and soda.   A fiber supplement may be added to your diet if you cannot get enough fiber from foods.   Drink enough fluids to keep your urine clear or pale yellow.   Exercise regularly or as directed by your caregiver.   Go to the restroom when you have the urge to go. Do not hold it.  Only take medicines as directed by your caregiver. Do not take other medicines for constipation without talking to your caregiver first. [place] IF:   You have bright red blood in your stool.   Your constipation lasts for more than 4 days or gets worse.   You have abdominal or rectal pain.   You have thin, pencil-like stools.  You have unexplained weight loss. MAKE SURE YOU:   Understand these instructions.  Will watch your condition.  Will get help right away if you are not doing well or get worse. Document Released: 07/07/2004 Document Revised: 01/01/2012 Document Reviewed: 07/21/2013 [facility] Patient Information 2014 [place], [place].

## 2014-01-06 ENCOUNTER — Encounter (HOSPITAL_COMMUNITY): Payer: Self-pay | Admitting: Emergency Medicine

## 2014-01-06 ENCOUNTER — Emergency Department (HOSPITAL_COMMUNITY)
Admission: EM | Admit: 2014-01-06 | Discharge: 2014-01-07 | Disposition: A | Discharge status: Home / Self Care | Payer: Medicaid Other | Attending: Emergency Medicine | Admitting: Emergency Medicine

## 2014-01-06 DIAGNOSIS — Z8742 Personal history of other diseases of the female genital tract: Secondary | ICD-10-CM

## 2014-01-06 DIAGNOSIS — N83209 Unspecified ovarian cyst, unspecified side: Secondary | ICD-10-CM | POA: Insufficient documentation

## 2014-01-06 DIAGNOSIS — R1032 Left lower quadrant pain: Secondary | ICD-10-CM

## 2014-01-06 DIAGNOSIS — B9689 Other specified bacterial agents as the cause of diseases classified elsewhere: Secondary | ICD-10-CM

## 2014-01-06 DIAGNOSIS — R6883 Chills (without fever): Secondary | ICD-10-CM | POA: Insufficient documentation

## 2014-01-06 DIAGNOSIS — N760 Acute vaginitis: Secondary | ICD-10-CM

## 2014-01-06 DIAGNOSIS — R112 Nausea with vomiting, unspecified: Secondary | ICD-10-CM | POA: Insufficient documentation

## 2014-01-06 DIAGNOSIS — G8929 Other chronic pain: Secondary | ICD-10-CM | POA: Insufficient documentation

## 2014-01-06 DIAGNOSIS — F17200 Nicotine dependence, unspecified, uncomplicated: Secondary | ICD-10-CM | POA: Insufficient documentation

## 2014-01-06 DIAGNOSIS — K650 Generalized (acute) peritonitis: Secondary | ICD-10-CM | POA: Insufficient documentation

## 2014-01-06 DIAGNOSIS — R109 Unspecified abdominal pain: Secondary | ICD-10-CM

## 2014-01-06 DIAGNOSIS — K5900 Constipation, unspecified: Secondary | ICD-10-CM | POA: Insufficient documentation

## 2014-01-06 LAB — CBC WITH DIFFERENTIAL/PLATELET
Basophils Absolute: 0 10*3/uL (ref 0.0–0.1)
Basophils Relative: 0 % (ref 0–1)
Eosinophils Absolute: 0 10*3/uL (ref 0.0–0.7)
Eosinophils Relative: 1 % (ref 0–5)
HEMATOCRIT: 36.7 % (ref 36.0–46.0)
Hemoglobin: 12.3 g/dL (ref 12.0–15.0)
LYMPHS PCT: 32 % (ref 12–46)
Lymphs Abs: 1.5 10*3/uL (ref 0.7–4.0)
MCH: 27.8 pg (ref 26.0–34.0)
MCHC: 33.5 g/dL (ref 30.0–36.0)
MCV: 82.8 fL (ref 78.0–100.0)
MONOS PCT: 5 % (ref 3–12)
Monocytes Absolute: 0.2 10*3/uL (ref 0.1–1.0)
NEUTROS ABS: 2.8 10*3/uL (ref 1.7–7.7)
Neutrophils Relative %: 62 % (ref 43–77)
Platelets: 279 10*3/uL (ref 150–400)
RBC: 4.43 MIL/uL (ref 3.87–5.11)
RDW: 13.7 % (ref 11.5–15.5)
WBC: 4.5 10*3/uL (ref 4.0–10.5)

## 2014-01-06 LAB — COMPREHENSIVE METABOLIC PANEL
ALT: 11 U/L (ref 0–35)
AST: 15 U/L (ref 0–37)
Albumin: 3.5 g/dL (ref 3.5–5.2)
Alkaline Phosphatase: 66 U/L (ref 39–117)
BILIRUBIN TOTAL: 0.3 mg/dL (ref 0.3–1.2)
BUN: 7 mg/dL (ref 6–23)
CALCIUM: 8.8 mg/dL (ref 8.4–10.5)
CHLORIDE: 103 meq/L (ref 96–112)
CO2: 24 meq/L (ref 19–32)
Creatinine, Ser: 0.7 mg/dL (ref 0.50–1.10)
GLUCOSE: 96 mg/dL (ref 70–99)
Potassium: 3.9 mEq/L (ref 3.7–5.3)
Sodium: 140 mEq/L (ref 137–147)
Total Protein: 7 g/dL (ref 6.0–8.3)

## 2014-01-06 LAB — URINALYSIS, ROUTINE W REFLEX MICROSCOPIC
Bilirubin Urine: NEGATIVE
Glucose, UA: NEGATIVE mg/dL
Hgb urine dipstick: NEGATIVE
KETONES UR: NEGATIVE mg/dL
Nitrite: POSITIVE — AB
Protein, ur: NEGATIVE mg/dL
Specific Gravity, Urine: 1.014 (ref 1.005–1.030)
Urobilinogen, UA: 1 mg/dL (ref 0.0–1.0)
pH: 7.5 (ref 5.0–8.0)

## 2014-01-06 LAB — URINE MICROSCOPIC-ADD ON

## 2014-01-06 LAB — WET PREP, GENITAL
Trich, Wet Prep: NONE SEEN
Yeast Wet Prep HPF POC: NONE SEEN

## 2014-01-06 LAB — POC URINE PREG, ED: Preg Test, Ur: NEGATIVE

## 2014-01-06 MED ORDER — MORPHINE SULFATE 4 MG/ML IJ SOLN: 4.0000 mg | Freq: Once | INTRAMUSCULAR | Status: DC

## 2014-01-06 MED ORDER — HYDROMORPHONE HCL PF 1 MG/ML IJ SOLN
0.5000 mg | Freq: Once | INTRAMUSCULAR | Status: AC
  Administered 2014-01-06: 0.5 mg via INTRAVENOUS
  Filled 2014-01-06: qty 1

## 2014-01-06 MED ORDER — ONDANSETRON HCL 4 MG/2ML IJ SOLN
4.0000 mg | INTRAMUSCULAR | Status: AC
  Administered 2014-01-06: 4 mg via INTRAVENOUS
  Filled 2014-01-06: qty 2

## 2014-01-06 MED ORDER — SODIUM CHLORIDE 0.9 % IV BOLUS (SEPSIS)
1000.0000 mL | Freq: Once | INTRAVENOUS | Status: AC
Start: 2014-01-06 — End: 2014-01-06
  Administered 2014-01-06: 1000 mL via INTRAVENOUS

## 2014-01-06 MED ORDER — DEXTROSE 5 % IV SOLN
1.0000 g | Freq: Once | INTRAVENOUS | Status: AC
  Administered 2014-01-06: 1 g via INTRAVENOUS
  Filled 2014-01-06: qty 10

## 2014-01-06 MED ORDER — OXYCODONE-ACETAMINOPHEN 5-325 MG PO TABS
2.0000 | ORAL_TABLET | Freq: Once | ORAL | Status: AC
  Administered 2014-01-06: 2 via ORAL
  Filled 2014-01-06: qty 2

## 2014-01-06 NOTE — ED Notes (Signed)
Pelvic cart at bedside. 

## 2014-01-06 NOTE — ED Provider Notes (Signed)
CSN: [id number]     Arrival date & time 01/06/14  1555 History   First MD Initiated Contact with Patient 01/06/14 2038     Chief Complaint  Patient presents with  . Abdominal Pain     (Consider location/radiation/quality/duration/timing/severity/associated sxs/prior Treatment) HPI Comments: Patient is a 28 year old female with a history of cesarean section, appendectomy, and "surgery for ovarian cyst" who presents to the emergency department for abdominal pain. Patient states that she has been experiencing abdominal pain on and off over the last few months. Patient was followed by a specialist in [place] for this, but relocated to [place] recently and will be here until May. She states that her abdominal pain feels slightly different and more severe than she has experienced in the past. She states pain is present in her right mid abdomen and radiates to her back. Symptoms associated with nausea and 4 episodes of nonbloody/nonbilious emesis. She also states that she has been experiencing a pressure sensation with urination. Patient has tried ibuprofen and Bentyl for her symptoms without relief. She denies associated fever, chest pain or shortness of breath, hematemesis, diarrhea, melena or hematochezia, urinary frequency or urgency, hematuria, vaginal bleeding or discharge, numbness/tingling, and weakness.  The history is provided by the patient. No language interpreter was used.    Past Medical History  Diagnosis Date  . Ovarian cyst   . Peritonitis, acute generalized   . Sepsis    Past Surgical History  Procedure Laterality Date  . Cesarean section    . Ovarian cyst drainage    . Appendectomy      ruptured    No family history on file. History  Substance Use Topics  . Smoking status: Current Every Day Smoker -- 0.50 packs/day    Types: Cigarettes  . Smokeless tobacco: Never Used  . Alcohol Use: No   OB History   Grav Para Term Preterm Abortions TAB SAB Ect Mult Living   2  2        2       Review of Systems  Constitutional: Positive for chills. Negative for fever.  Respiratory: Negative for shortness of breath.   Cardiovascular: Negative for chest pain.  Gastrointestinal: Positive for nausea, vomiting (NB/NB x 1) and abdominal pain. Negative for diarrhea.  Genitourinary: Negative for hematuria, vaginal bleeding and vaginal discharge.       Pressure with urination  Neurological: Negative for weakness and numbness.  All other systems reviewed and are negative.     Allergies  Review of patient's allergies indicates no known allergies.  Home Medications   Current Outpatient Rx  Name  Route  Sig  Dispense  Refill  . acetaminophen (TYLENOL) 500 MG tablet   Oral   Take 1,500 mg by mouth every 6 (six) hours as needed for moderate pain.         Marland Kitchen albuterol (PROVENTIL HFA;VENTOLIN HFA) 108 (90 BASE) MCG/ACT inhaler   Inhalation   Inhale 1-2 puffs into the lungs every 6 (six) hours as needed for wheezing or shortness of breath.   1 Inhaler   0   . dicyclomine (BENTYL) 20 MG tablet   Oral   Take 1 tablet (20 mg total) by mouth 4 (four) times daily -  before meals and at bedtime.   30 tablet   1   . docusate sodium (COLACE) 100 MG capsule   Oral   Take 1 capsule (100 mg total) by mouth every 12 (twelve) hours.   30 capsule  0   . ondansetron (ZOFRAN) 4 MG tablet   Oral   Take 1 tablet (4 mg total) by mouth every 8 (eight) hours as needed for nausea or vomiting.   20 tablet   0   . polyethylene glycol powder (GLYCOLAX/MIRALAX) powder   Oral   Take 17 g by mouth 3 (three) times daily. Until daily soft stools  OTC   255 g   0    BP 126/76  Pulse 64  Temp(Src) 97.8 F (36.6 C) (Oral)  Resp 12  Wt 190 lb (86.183 kg)  SpO2 100%  LMP 12/25/2013  Physical Exam  Nursing note and vitals reviewed. Constitutional: She is oriented to person, place, and time. She appears well-developed and well-nourished. No distress.  HENT:  Head:  Normocephalic and atraumatic.  Mouth/Throat: Oropharynx is clear and moist. No oropharyngeal exudate.  Eyes: Conjunctivae and EOM are normal. Pupils are equal, round, and reactive to light. No scleral icterus.  Neck: Normal range of motion.  Cardiovascular: Normal rate, regular rhythm and normal heart sounds.   Pulmonary/Chest: Effort normal and breath sounds normal. No respiratory distress. She has no wheezes. She has no rales.  Abdominal: Soft. She exhibits no distension. There is tenderness (generalized; more so in R mid abdomen and R CVA both of which are mild). There is no rebound and no guarding.  No peritoneal signs or guarding. Negative Murphy's sign.  Genitourinary: There is no rash, tenderness, lesion or injury on the right labia. There is no rash, tenderness, lesion or injury on the left labia. Uterus is not tender. Cervix exhibits no motion tenderness. Right adnexum displays no mass, no tenderness and no fullness. Left adnexum displays no mass, no tenderness and no fullness. No erythema, tenderness or bleeding around the vagina. Vaginal discharge (mild white opaque) found.  Musculoskeletal: Normal range of motion.  Neurological: She is alert and oriented to person, place, and time.  Skin: Skin is warm and dry. No rash noted. She is not diaphoretic. No erythema. No pallor.  Psychiatric: She has a normal mood and affect. Her behavior is normal.    ED Course  Procedures (including critical care time) Labs Review Labs Reviewed  WET PREP, GENITAL - Abnormal; Notable for the following:    Clue Cells Wet Prep HPF POC MODERATE (*)    WBC, Wet Prep HPF POC FEW (*)    All other components within normal limits  URINALYSIS, ROUTINE W REFLEX MICROSCOPIC - Abnormal; Notable for the following:    APPearance CLOUDY (*)    Nitrite POSITIVE (*)    Leukocytes, UA TRACE (*)    All other components within normal limits  URINE MICROSCOPIC-ADD ON - Abnormal; Notable for the following:    Squamous  Epithelial / LPF MANY (*)    Bacteria, UA MANY (*)    All other components within normal limits  URINALYSIS, ROUTINE W REFLEX MICROSCOPIC - Abnormal; Notable for the following:    Leukocytes, UA SMALL (*)    All other components within normal limits  GC/CHLAMYDIA PROBE AMP  CBC WITH DIFFERENTIAL  COMPREHENSIVE METABOLIC PANEL  URINE MICROSCOPIC-ADD ON  POC URINE PREG, ED   Imaging Review Dg Abd Acute W/chest  01/07/2014   CLINICAL DATA:  Pain.  EXAM: ACUTE ABDOMEN SERIES (ABDOMEN 2 VIEW & CHEST 1 VIEW)  COMPARISON:  DG ABDOMEN 1V dated 11/17/2013  FINDINGS: No acute cardiopulmonary disease. Poor inspiration. The abdominal soft tissues are unremarkable. Large amount of stool noted throughout the colon. No bowel  distention. No free air. No acute bony abnormality. Surgical clip left lower abdomen.  IMPRESSION: 1. No acute cardiopulmonary disease.  Poor expiratory effort. 2. Large amount stool noted throughout the colon. No bowel distention.   Electronically Signed   By: Marcello Moores  Register   On: 01/07/2014 00:33     EKG Interpretation None      MDM   Final diagnoses:  Constipation  Chronic abdominal pain    2245 - Patient present to the ED for abdominal pain; hx of same, but states today feels different. Patient well and nontoxic appearing, hemodynamically stable, and afebrile. Patient with TTP in R mid abdomen and R flank; no peritoneal signs on exam. UA suggests infection vs contamination. Given location of pain and pressure with urination will cover with Rocephin for pyelonephritis. Wet prep and GC/chlamydia pending.  2330 - Wet prep negative. Have consulted with my attending who believes symptoms less suggestive of pyelonephritis. Recommends repeat UA with in and out cath as well as acute abdominal series to r/o obstruction given hx of abdominal surgeries.   0045 - DG acute abdomen with nonobstructive gas pattern; no free air. Stool burden appreciated. Repeat UA does not suggest  infection. Symptoms likely exacerbation of chronic abdominal pain. Findings reviewed with the patient who verbalizes understanding. Serial abdominal examinations stable. Have recommended treatment with Colace, MiraLax, and over-the-counter [place] enemas. Zofran prescribed for nausea as needed. Patient sitting in the room in no visible or audible discomfort. She is eating [employer]. Patient stable and appropriate for discharge with instruction to followup with a primary care provider. Return precautions provided and patient agreeable to plan with no unaddressed concerns.  Antonietta Breach, PA-C 01/10/14 1859

## 2014-01-06 NOTE — ED Provider Notes (Signed)
Complains of right-sided abdominal pain for the past 2-3 months. Daily. Pain is worse with being. She she vomited one time today at approximately 12 noon. Had normal bowel movement today. Her pain is much improved since treatment in emergency department. On exam no distress nontoxic alert abdomen obese midline surgical scar normal bowel sounds mildly tender at right upper quadrant and right flank.  Orlie Dakin, MD 01/06/14 2351

## 2014-01-06 NOTE — ED Notes (Signed)
Nurse First rounds : Nurse explained delay , wait time and process to pt. No distress / respirations unlabored.

## 2014-01-06 NOTE — ED Notes (Signed)
Pt to ED with c/o mid to lower abd pain with nausea, vomiting and diarrhea x's 4 days.

## 2014-01-07 ENCOUNTER — Emergency Department (HOSPITAL_COMMUNITY): Payer: Medicaid Other

## 2014-01-07 LAB — URINE MICROSCOPIC-ADD ON

## 2014-01-07 LAB — URINALYSIS, ROUTINE W REFLEX MICROSCOPIC
Bilirubin Urine: NEGATIVE
Glucose, UA: NEGATIVE mg/dL
Hgb urine dipstick: NEGATIVE
Ketones, ur: NEGATIVE mg/dL
NITRITE: NEGATIVE
PH: 6 (ref 5.0–8.0)
Protein, ur: NEGATIVE mg/dL
Specific Gravity, Urine: 1.018 (ref 1.005–1.030)
Urobilinogen, UA: 1 mg/dL (ref 0.0–1.0)

## 2014-01-07 LAB — GC/CHLAMYDIA PROBE AMP
CT Probe RNA: NEGATIVE
GC PROBE AMP APTIMA: NEGATIVE

## 2014-01-07 MED ORDER — ONDANSETRON HCL 4 MG PO TABS: 4.0000 mg | ORAL_TABLET | Freq: Three times a day (TID) | ORAL | Status: DC | PRN

## 2014-01-07 MED ORDER — DOCUSATE SODIUM 100 MG PO CAPS: 100.0000 mg | ORAL_CAPSULE | Freq: Two times a day (BID) | ORAL | Status: DC

## 2014-01-07 MED ORDER — POLYETHYLENE GLYCOL 3350 17 GM/SCOOP PO POWD: 17.0000 g | Freq: Three times a day (TID) | ORAL | Status: DC

## 2014-01-07 NOTE — Discharge Instructions (Signed)
Recommend Miralax and Colace for symptoms. You may also try over the counter Fleet enemas. Take zofran for nausea/vomiting. Take ibuprofen for pain control as needed. Follow up with a primary care doctor. Return if symptoms worsen.  Constipation, Adult Constipation is when a person has fewer than 3 bowel movements a week; has difficulty having a bowel movement; or has stools that are dry, hard, or larger than normal. As people grow older, constipation is more common. If you try to fix constipation with medicines that make you have a bowel movement (laxatives), the problem may get worse. Long-term laxative use may cause the muscles of the colon to become weak. A low-fiber diet, not taking in enough fluids, and taking certain medicines may make constipation worse. CAUSES   Certain medicines, such as antidepressants, pain medicine, iron supplements, antacids, and water pills.   Certain diseases, such as diabetes, irritable bowel syndrome (IBS), thyroid disease, or depression.   Not drinking enough water.   Not eating enough fiber-rich foods.   Stress or travel.  Lack of physical activity or exercise.  Not going to the restroom when there is the urge to have a bowel movement.  Ignoring the urge to have a bowel movement.  Using laxatives too much. SYMPTOMS   Having fewer than 3 bowel movements a week.   Straining to have a bowel movement.   Having hard, dry, or larger than normal stools.   Feeling full or bloated.   Pain in the lower abdomen.  Not feeling relief after having a bowel movement. DIAGNOSIS  Your caregiver will take a medical history and perform a physical exam. Further testing may be done for severe constipation. Some tests may include:   A barium enema X-ray to examine your rectum, colon, and sometimes, your small intestine.  A sigmoidoscopy to examine your lower colon.  A colonoscopy to examine your entire colon. TREATMENT  Treatment will depend on the  severity of your constipation and what is causing it. Some dietary treatments include drinking more fluids and eating more fiber-rich foods. Lifestyle treatments may include regular exercise. If these diet and lifestyle recommendations do not help, your caregiver may recommend taking over-the-counter laxative medicines to help you have bowel movements. Prescription medicines may be prescribed if over-the-counter medicines do not work.  HOME CARE INSTRUCTIONS   Increase dietary fiber in your diet, such as fruits, vegetables, whole grains, and beans. Limit high-fat and processed sugars in your diet, such as [place] fries, hamburgers, cookies, candies, and soda.   A fiber supplement may be added to your diet if you cannot get enough fiber from foods.   Drink enough fluids to keep your urine clear or pale yellow.   Exercise regularly or as directed by your caregiver.   Go to the restroom when you have the urge to go. Do not hold it.  Only take medicines as directed by your caregiver. Do not take other medicines for constipation without talking to your caregiver first. [place] IF:   You have bright red blood in your stool.   Your constipation lasts for more than 4 days or gets worse.   You have abdominal or rectal pain.   You have thin, pencil-like stools.  You have unexplained weight loss. MAKE SURE YOU:   Understand these instructions.  Will watch your condition.  Will get help right away if you are not doing well or get worse. Document Released: 07/07/2004 Document Revised: 01/01/2012 Document Reviewed: 07/21/2013 ExitCare Patient Information 2014  [employer].  Emergency Department Resource Guide 1) Find a Doctor and Pay Out of Pocket Although you won't have to find out who is covered by your insurance plan, it is a good idea to ask around and get recommendations. You will then need to call the office and see if the doctor you have chosen will accept  you as a new patient and what types of options they offer for patients who are self-pay. Some doctors offer discounts or will set up payment plans for their patients who do not have insurance, but you will need to ask so you aren't surprised when you get to your appointment.  2) Contact Your Local Health Department Not all health departments have doctors that can see patients for sick visits, but many do, so it is worth a call to see if yours does. If you don't know where your local health department is, you can check in your phone book. The CDC also has a tool to help you locate your state's health department, and many state websites also have listings of all of their local health departments.  3) Find a [facility] If your illness is not likely to be very severe or complicated, you may want to try a walk in clinic. These are popping up all over the country in pharmacies, drugstores, and shopping centers. They're usually staffed by nurse practitioners or physician assistants that have been trained to treat common illnesses and complaints. They're usually fairly quick and inexpensive. However, if you have serious medical issues or chronic medical problems, these are probably not your best option.  No Primary Care Doctor: - Call Health Connect at  [phone] - they can help you locate a primary care doctor that  accepts your insurance, provides certain services, etc. - Physician Referral Service- [phone]  Chronic Pain Problems: Organization         Address  Phone   Notes  [facility]  [phone] Patients need to be referred by their primary care doctor.   Medication Assistance: Organization         Address  Phone   Notes  [facility] Medication [facility] [address] [place], [place] [address] [phone] --Must be a resident of [facility] -- Must have NO insurance coverage whatsoever (no Medicaid/ Medicare, etc.) -- The pt. MUST  have a primary care doctor that directs their care regularly and follows them in the community   [facility]  [phone]   [employer]  [phone]    Agencies that provide inexpensive medical care: Organization         Address  Phone   Notes  [facility]  [phone]   [facility]    [phone]   [facility] [place],  [address] [phone]   [place] [address], [place] [phone]   [facility]    [phone]   [facility]    [phone]   [place] [address], [place] Phone:  [phone], Fax:  [phone] Hours of Operation:  9 am - 6 pm, M-F.  Also accepts Medicaid/Medicare and self-pay.  [facility] for [address], [place] Phone: [phone], Fax: [phone]. Hours of Operation:  8:30 am - 5:30 pm, M-F.  Also accepts Medicaid and self-pay.  [facility] [place] [address], [place] Phone: ([phone]) [phone]   °[facility] [address], [place], [place] ([phone], Ext. 123 Mondays & Thursdays: 7-9 AM.  First 15 patients are seen on a first come, first serve basis. °  ° °Medicaid-accepting [place] Providers: ° °Organization         Address  Phone   Notes  °[facility] [address], [place] [phone] Also accepts self-pay patients.  °[facility] [address], [place] ° ([phone]) [phone]   °[facility] [address], [place] ([phone]) [phone]   °[facility] [address] [place] [address], [place] ([phone]) [phone]   °Veita Bland [address], [place]  ° ([phone]) [phone] Only accepts [place] Access Medicaid patients after they have their name applied to their card.  ° °Self-Pay (no insurance) in [place]: ° °Organization         Address  Phone   Notes  °Sickle Cell Patients, [facility] [address], [place] ([phone]) [phone]   °[facility] [address], [place] ([phone]) [phone]   °[facility] [place] ° [address], [place] ([phone]) [phone]   °[facility]/Dr. Osei-Bonsu ° [address] [place] [address], [place] or [address], [place] ([phone]) [phone] Phone number for both [place] and [place] locations is the same.  °[facility] [address], [place] ([phone]) [phone]   °[facility] [address] [place] [address], [place] or [address] ([phone]) [phone] °(336) [phone]   °[facility] [address], [place] (336) [phone], phone; ([phone]) [phone], fax Sees patients 1st and 3rd Saturday of every month.  Must not qualify for public or private insurance (i.e. Medicaid, Medicare, [place] Health Choice, Veterans' Benefits) • Household income should be no more than 200% of the poverty level •The clinic cannot treat you if you are pregnant or think you are pregnant • Sexually transmitted diseases are not treated at the clinic.  ° ° °Dental Care: °Organization         Address  Phone  Notes  °[place] [facility] [address], [place] ([phone]) [phone] Accepts children up to age 21 who are enrolled in Medicaid or [place] Health Choice; pregnant women with a Medicaid card; and children who have applied for Medicaid or [place] Health Choice, but were declined, whose parents can pay a reduced fee at time of service.  °[place] [facility] [place]  [address], [place] (336) [phone] Accepts children up to age 21 who are enrolled in Medicaid or [place] Health Choice; pregnant women with a Medicaid card; and children who have applied for Medicaid or [place] Health Choice, but were declined, whose parents can  pay a reduced fee at time of service.  °[facility] ° [address],  ([phone]) [phone] Patients are seen by appointment only. Walk-ins are not accepted. [facility] will see patients 18 years of age and older. °Monday - Tuesday (8am-5pm) °Most Wednesdays (8:30-5pm) °$30 per visit, cash only  °[facility] ° [address], [place] ([phone]) [phone] Patients are seen by appointment only. Walk-ins are not accepted. [facility] will see patients 18 years of   age and older. °One Wednesday Evening (Monthly: Volunteer Based).  $30 per visit, cash only  °[facility]  ([phone]) [phone] for adults; Children under age 4, call Graduate Pediatric Dentistry at [phone]. Children aged 4-14, please call [phone]) [phone] to request a pediatric application. ° Dental services are provided in all areas of dental care including fillings, crowns and bridges, complete and partial dentures, implants, gum treatment, root canals, and extractions. Preventive care is also provided. Treatment is provided to both adults and children. °Patients are selected via a lottery and there is often a waiting list. °  °[facility] [address], °[place] ° (336) 763-8833 [email or web address] °  °[facility] [address], [place], [place] (336)[phone], Ext. 123 Second and Fourth Thursday of each month, opens at 6:30 AM; Clinic ends at 9 AM.  Patients are seen on a first-come first-served basis, and a limited number are seen during each clinic.  ° °[facility] ° [address], [place], [place] ([phone]) [phone]   Eligibility Requirements °You must have lived in [place], [place], or [place] counties for at least the last three months. °  You cannot be eligible for state or federal sponsored healthcare insurance, including Veterans Administration, Medicaid, or Medicare. °  You generally cannot be eligible for healthcare  insurance through your employer.  °  How to apply: °Eligibility screenings are held every Tuesday and Wednesday afternoon from 1:00 pm until 4:00 pm. You do not need an appointment for the interview!  °[facility] [address], [place], [place] [phone]   °[facility]  336-342-8273   °[place] [facility]  336-703-3100   °[facility]  336-570-6415   ° °Behavioral Health Resources in the Community: °Intensive Outpatient Programs °Organization         Address  Phone  Notes  °[facility] [address], [place], [place] 336-878-6098   °[facility] [address], [place], [place] 336-832-9800   °[facility] [address], [place], [place] ° 336-882-2125   °[facility] [address],  °[place], [place] 1-800-853-5163 or 336-641-4981   °Substance Abuse Resources °Organization         Address  Phone  Notes  °[facility]  336-882-2125   °[facility]  336-784-9470   °[facility]  336-285-9073   °[facility]  336-845-3988   °Residential & Outpatient Substance Abuse Program  1-800-659-3381   °Psychological Services °Organization         Address  Phone  Notes  °[facility]  336- [phone]   °[facility]  336- 378-7881   °[facility] [address], [place] 1-800-853-5163 or 336-641-4981   ° °Mobile Crisis Teams °Organization         Address  Phone  Notes  °[facility], Mobile Crisis Care Unit  1-877-626-1772   °Assertive °[facility] ° [address] [place], [place] 336-834-9664   °Sharon DeEsch [address] °[place] 336-554-5454   ° °Self-Help/Support Groups °Organization         Address  Phone             Notes  °[facility]oc[facility] of  - variety of support groups  336- 373-1402 Call for more information  °Narcotics Anonymous (NA),  [facility] [address], °[place]  2   meetings at this location   Residential Treatment Programs Organization         Address  Phone  Notes  [facility] [address],    [place]  1-(775) 793-0609   [facility]  [address] [place] [address], [place], [place]   [place], [place] [phone] Admissions: 8am-3pm M-F  [facility] [place] [address],    [place], [place] [phone]   [facility] [address] [place], [place], [place]   [facility] [address]  [place], [place]   [facility] - Intensive Outpatient [address], [place], [place]   [facility] [address]  [place], [place] 1-989-061-5551 or 520-352-1812   [facility] ([facility] [address], [place], [place] Accepts Medicaid  [facility] [address],  [place] 1-203-359-0902 Substance Abuse/Addiction Treatment   [facility] Organization         Address  Phone  Notes  [facility]  417-212-4717   Domenic Schwab, PhD [address] [place], [place]   860-227-2604 or 203-715-2839   [facility], [place] [phone]   [facility] [address] [place], [place] [phone] Insurance/Medicaid/sponsorship through [facility] [address]                                    [place], [place] [phone] [place] [address], [place] [phone]    Dr. Adele Schilder  [phone]   [facility] Dept. 1) [address], [place] 2) [place] 3)  [place] [address], [place] [phone]  [phone]     [place] [phone] or [phone] (After Hours)

## 2014-01-15 NOTE — ED Provider Notes (Signed)
Medical screening examination/treatment/procedure(s) were conducted as a shared visit with non-physician practitioner(s) and myself.  I personally evaluated the patient during the encounter.   EKG Interpretation None       Orlie Dakin, MD 01/15/14 1201

## 2014-04-16 ENCOUNTER — Emergency Department (HOSPITAL_BASED_OUTPATIENT_CLINIC_OR_DEPARTMENT_OTHER): Payer: Self-pay

## 2014-04-16 ENCOUNTER — Encounter (HOSPITAL_BASED_OUTPATIENT_CLINIC_OR_DEPARTMENT_OTHER): Payer: Self-pay | Admitting: Emergency Medicine

## 2014-04-16 ENCOUNTER — Emergency Department (HOSPITAL_BASED_OUTPATIENT_CLINIC_OR_DEPARTMENT_OTHER): Payer: Medicaid Other

## 2014-04-16 ENCOUNTER — Emergency Department (HOSPITAL_BASED_OUTPATIENT_CLINIC_OR_DEPARTMENT_OTHER)
Admission: EM | Admit: 2014-04-16 | Discharge: 2014-04-16 | Disposition: A | Discharge status: Home / Self Care | Payer: Medicaid Other | Attending: Emergency Medicine | Admitting: Emergency Medicine

## 2014-04-16 DIAGNOSIS — N83202 Unspecified ovarian cyst, left side: Secondary | ICD-10-CM

## 2014-04-16 DIAGNOSIS — R42 Dizziness and giddiness: Secondary | ICD-10-CM | POA: Insufficient documentation

## 2014-04-16 DIAGNOSIS — A499 Bacterial infection, unspecified: Secondary | ICD-10-CM | POA: Insufficient documentation

## 2014-04-16 DIAGNOSIS — F17200 Nicotine dependence, unspecified, uncomplicated: Secondary | ICD-10-CM | POA: Insufficient documentation

## 2014-04-16 DIAGNOSIS — N83209 Unspecified ovarian cyst, unspecified side: Secondary | ICD-10-CM | POA: Insufficient documentation

## 2014-04-16 DIAGNOSIS — B9689 Other specified bacterial agents as the cause of diseases classified elsewhere: Secondary | ICD-10-CM | POA: Insufficient documentation

## 2014-04-16 DIAGNOSIS — Z9089 Acquired absence of other organs: Secondary | ICD-10-CM | POA: Insufficient documentation

## 2014-04-16 DIAGNOSIS — R141 Gas pain: Secondary | ICD-10-CM | POA: Insufficient documentation

## 2014-04-16 DIAGNOSIS — R143 Flatulence: Secondary | ICD-10-CM

## 2014-04-16 DIAGNOSIS — R51 Headache: Secondary | ICD-10-CM | POA: Insufficient documentation

## 2014-04-16 DIAGNOSIS — N760 Acute vaginitis: Secondary | ICD-10-CM | POA: Insufficient documentation

## 2014-04-16 DIAGNOSIS — Z79899 Other long term (current) drug therapy: Secondary | ICD-10-CM | POA: Insufficient documentation

## 2014-04-16 DIAGNOSIS — Z3202 Encounter for pregnancy test, result negative: Secondary | ICD-10-CM | POA: Insufficient documentation

## 2014-04-16 DIAGNOSIS — Z8719 Personal history of other diseases of the digestive system: Secondary | ICD-10-CM | POA: Insufficient documentation

## 2014-04-16 DIAGNOSIS — R142 Eructation: Secondary | ICD-10-CM

## 2014-04-16 LAB — URINALYSIS, ROUTINE W REFLEX MICROSCOPIC
Bilirubin Urine: NEGATIVE
GLUCOSE, UA: NEGATIVE mg/dL
HGB URINE DIPSTICK: NEGATIVE
Ketones, ur: NEGATIVE mg/dL
Leukocytes, UA: NEGATIVE
Nitrite: NEGATIVE
PROTEIN: NEGATIVE mg/dL
Specific Gravity, Urine: 1.024 (ref 1.005–1.030)
Urobilinogen, UA: 1 mg/dL (ref 0.0–1.0)
pH: 7.5 (ref 5.0–8.0)

## 2014-04-16 LAB — CBC WITH DIFFERENTIAL/PLATELET
BASOS PCT: 0 % (ref 0–1)
Basophils Absolute: 0 10*3/uL (ref 0.0–0.1)
Eosinophils Absolute: 0.1 10*3/uL (ref 0.0–0.7)
Eosinophils Relative: 1 % (ref 0–5)
HEMATOCRIT: 35.1 % — AB (ref 36.0–46.0)
HEMOGLOBIN: 11.4 g/dL — AB (ref 12.0–15.0)
Lymphocytes Relative: 28 % (ref 12–46)
Lymphs Abs: 1.3 10*3/uL (ref 0.7–4.0)
MCH: 27.3 pg (ref 26.0–34.0)
MCHC: 32.5 g/dL (ref 30.0–36.0)
MCV: 84 fL (ref 78.0–100.0)
MONO ABS: 0.3 10*3/uL (ref 0.1–1.0)
MONOS PCT: 7 % (ref 3–12)
NEUTROS ABS: 2.9 10*3/uL (ref 1.7–7.7)
Neutrophils Relative %: 64 % (ref 43–77)
Platelets: 280 10*3/uL (ref 150–400)
RBC: 4.18 MIL/uL (ref 3.87–5.11)
RDW: 13.7 % (ref 11.5–15.5)
WBC: 4.6 10*3/uL (ref 4.0–10.5)

## 2014-04-16 LAB — COMPREHENSIVE METABOLIC PANEL
ALT: 6 U/L (ref 0–35)
AST: 17 U/L (ref 0–37)
Albumin: 3.6 g/dL (ref 3.5–5.2)
Alkaline Phosphatase: 67 U/L (ref 39–117)
BUN: 10 mg/dL (ref 6–23)
CO2: 25 mEq/L (ref 19–32)
CREATININE: 0.7 mg/dL (ref 0.50–1.10)
Calcium: 9.1 mg/dL (ref 8.4–10.5)
Chloride: 101 mEq/L (ref 96–112)
GLUCOSE: 102 mg/dL — AB (ref 70–99)
Potassium: 3.7 mEq/L (ref 3.7–5.3)
Sodium: 137 mEq/L (ref 137–147)
Total Bilirubin: 0.2 mg/dL — ABNORMAL LOW (ref 0.3–1.2)
Total Protein: 7.4 g/dL (ref 6.0–8.3)

## 2014-04-16 LAB — WET PREP, GENITAL
TRICH WET PREP: NONE SEEN
Yeast Wet Prep HPF POC: NONE SEEN

## 2014-04-16 LAB — PREGNANCY, URINE: PREG TEST UR: NEGATIVE

## 2014-04-16 MED ORDER — PROMETHAZINE HCL 25 MG PO TABS: 25.0000 mg | ORAL_TABLET | Freq: Four times a day (QID) | ORAL | Status: DC | PRN

## 2014-04-16 MED ORDER — SODIUM CHLORIDE 0.9 % IV BOLUS (SEPSIS)
1000.0000 mL | Freq: Once | INTRAVENOUS | Status: AC
  Administered 2014-04-16: 1000 mL via INTRAVENOUS

## 2014-04-16 MED ORDER — IBUPROFEN 800 MG PO TABS: 800.0000 mg | ORAL_TABLET | Freq: Three times a day (TID) | ORAL | Status: DC | PRN

## 2014-04-16 MED ORDER — OXYCODONE-ACETAMINOPHEN 5-325 MG PO TABS: 1.0000 | ORAL_TABLET | ORAL | Status: DC | PRN

## 2014-04-16 MED ORDER — KETOROLAC TROMETHAMINE 30 MG/ML IJ SOLN
30.0000 mg | Freq: Once | INTRAMUSCULAR | Status: AC
  Administered 2014-04-16: 30 mg via INTRAVENOUS
  Filled 2014-04-16: qty 1

## 2014-04-16 MED ORDER — DIPHENHYDRAMINE HCL 25 MG PO CAPS
50.0000 mg | ORAL_CAPSULE | Freq: Once | ORAL | Status: AC
  Administered 2014-04-16: 50 mg via ORAL
  Filled 2014-04-16: qty 2

## 2014-04-16 MED ORDER — HYDROMORPHONE HCL PF 1 MG/ML IJ SOLN
1.0000 mg | Freq: Once | INTRAMUSCULAR | Status: AC
Start: 2014-04-16 — End: 2014-04-16
  Administered 2014-04-16: 1 mg via INTRAVENOUS
  Filled 2014-04-16: qty 1

## 2014-04-16 MED ORDER — METRONIDAZOLE 500 MG PO TABS: 500.0000 mg | ORAL_TABLET | Freq: Two times a day (BID) | ORAL | Status: DC

## 2014-04-16 MED ORDER — MORPHINE SULFATE 4 MG/ML IJ SOLN
4.0000 mg | Freq: Once | INTRAMUSCULAR | Status: AC
  Administered 2014-04-16: 4 mg via INTRAVENOUS
  Filled 2014-04-16: qty 1

## 2014-04-16 MED ORDER — ONDANSETRON HCL 4 MG/2ML IJ SOLN
4.0000 mg | Freq: Once | INTRAMUSCULAR | Status: AC
  Administered 2014-04-16: 4 mg via INTRAVENOUS
  Filled 2014-04-16: qty 2

## 2014-04-16 NOTE — ED Notes (Signed)
C/o abd pain and HA x 2 days-ws brought to ED by co-worker

## 2014-04-16 NOTE — ED Provider Notes (Signed)
TIME SEEN: 5:27 PM  CHIEF COMPLAINT: Abdominal pain, headache,  HPI: Patient is a 27 y.o. F G2 P2 with history of ovarian cysts, status post appendectomy, cystectomy and C-section who presents emergency department with 3 days of headaches, lightheadedness and diffuse abdominal pain that she describes as a crampy pain without radiation. Denies any aggravating or relieving factors. She has been to the emergency department several times for similar abdominal pain. She denies any fevers, vomiting or diarrhea. States she has had chills and nausea. States her last bowel movement was yesterday and was normal. No bloody stool or melena. No dysuria, hematuria. She states she has had some white vaginal discharge. No vaginal bleeding. Her last menstrual period was June 15. She states her pain is similar to her pain in the past and she has been taking MiraLAX and Colace without relief. She was told in the past that she was constipated but she feels that this is not true because she has a bowel movement every day. She has been passing gas. Denies any sick contacts or recent travel, hospitalization or antibiotic use. She denies being sexually active. Denies a history of STDs. She was told to followup with her primary care physician and a gastroenterologist. She states she has not done either. Denies any numbness, tingling or focal weakness. No head injury. No neck pain or neck stiffness.  ROS: See HPI Constitutional: no fever  Eyes: no drainage  ENT: no runny nose   Cardiovascular:  no chest pain  Resp: no SOB  GI: no vomiting GU: no dysuria Integumentary: no rash  Allergy: no hives  Musculoskeletal: no leg swelling  Neurological: no slurred speech ROS otherwise negative  PAST MEDICAL HISTORY/PAST SURGICAL HISTORY:  Past Medical History  Diagnosis Date  . Ovarian cyst   . Peritonitis, acute generalized   . Sepsis     MEDICATIONS:  Prior to Admission medications   Medication Sig Start Date End Date  Taking? Authorizing Provider  acetaminophen (TYLENOL) 500 MG tablet Take 1,500 mg by mouth every 6 (six) hours as needed for moderate pain.    Historical Provider, MD  albuterol (PROVENTIL HFA;VENTOLIN HFA) 108 (90 BASE) MCG/ACT inhaler Inhale 1-2 puffs into the lungs every 6 (six) hours as needed for wheezing or shortness of breath. 11/17/13   Ruthell Rummage Dammen, PA-C  dicyclomine (BENTYL) 20 MG tablet Take 1 tablet (20 mg total) by mouth 4 (four) times daily -  before meals and at bedtime. 10/18/13 10/18/14  Manya Silvas, CNM  docusate sodium (COLACE) 100 MG capsule Take 1 capsule (100 mg total) by mouth every 12 (twelve) hours. 01/07/14   Antonietta Breach, PA-C  ondansetron (ZOFRAN) 4 MG tablet Take 1 tablet (4 mg total) by mouth every 8 (eight) hours as needed for nausea or vomiting. 01/07/14   Antonietta Breach, PA-C  polyethylene glycol powder (GLYCOLAX/MIRALAX) powder Take 17 g by mouth 3 (three) times daily. Until daily soft stools  OTC 01/07/14   Antonietta Breach, PA-C    ALLERGIES:  No Known Allergies  SOCIAL HISTORY:  History  Substance Use Topics  . Smoking status: Current Every Day Smoker -- 0.50 packs/day    Types: Cigarettes  . Smokeless tobacco: Never Used  . Alcohol Use: No    FAMILY HISTORY: No family history on file.  EXAM: BP 121/77  Pulse 77  Temp(Src) 98.2 F (36.8 C) (Oral)  Resp 16  Ht 5\' 7"  (1.702 m)  Wt 210 lb (95.255 kg)  BMI 32.88 kg/m2  SpO2  100%  LMP 04/06/2014 CONSTITUTIONAL: Alert and oriented and responds appropriately to questions. Well-appearing; well-nourished, nontoxic, appears uncomfortable HEAD: Normocephalic EYES: Conjunctivae clear, PERRL ENT: normal nose; no rhinorrhea; moist mucous membranes; pharynx without lesions noted NECK: Supple, no meningismus, no LAD  CARD: RRR; S1 and S2 appreciated; no murmurs, no clicks, no rubs, no gallops RESP: Normal chest excursion without splinting or tachypnea; breath sounds clear and equal bilaterally; no wheezes, no  rhonchi, no rales,  ABD/GI: Normal bowel sounds; non-distended; soft, mildly tender to palpation across the lower abdomen without guarding or rebound, no peritoneal signs, well-healed midline vertical surgical scar GU:  Patient has a minimal amount of thin white vaginal discharge, no vaginal bleeding, no cervical motion tenderness, patient has bilateral adnexal tenderness worse on the right than left with no fullness, normal external genitalia BACK:  The back appears normal and is non-tender to palpation, there is no CVA tenderness EXT: Normal ROM in all joints; non-tender to palpation; no edema; normal capillary refill; no cyanosis    SKIN: Normal color for age and race; warm NEURO: Moves all extremities equally PSYCH: The patient's mood and manner are appropriate. Grooming and personal hygiene are appropriate. Patient with histrionic behavior.  MEDICAL DECISION MAKING: Patient here with lower abdominal pain that she has had in the past and was told was due to constipation. She states she's also had headache and lightheadedness, nausea and chills. On exam, patient's abdominal exam is benign with very mild tenderness to palpation diffusely across the lower abdomen. She is hemodynamically stable. Nontoxic appearing. Differential diagnosis includes UTI, constipation, ovarian cysts, ectopic, torsion, colitis. Will obtain labs, urine, pelvic exam with cultures, abdominal x-ray. We'll give IV fluids, Toradol and Zofran and reassess.  ED PROGRESS: Patient also has bilateral adnexal tenderness. Will obtain pelvic ultrasound with Doppler. Wet prep and GC Chlamydia pending. Urinalysis shows no sign of infection or hematuria. Urine pregnancy test negative. Labs are unremarkable. No leukocytosis.   7:39 PM  Pt's wet prep is positive for clue cells. Imaging pending. She states she's had no improvement with Toradol morphine and states "they usually give me Dilaudid". We'll give 1 dose of IV Dilaudid.  8:09 PM  Pt  has a 3.3 cm complex ovarian cyst that is likely hemorrhagic cyst. No torsion. No other pelvic abnormality. She reports her pain is controlled after Dilaudid. We'll discharge him with supportive care instructions, prescription for ibuprofen and Percocet. We'll also discharge with prescription for Flagyl for her bacterial vaginosis. Discussed return precautions. Patient verbalizes understanding and is comfortable with plan.  [place], DO 04/16/14 2009

## 2014-04-16 NOTE — Discharge Instructions (Signed)
Bacterial Vaginosis Bacterial vaginosis is a vaginal infection that occurs when the normal balance of bacteria in the vagina is disrupted. It results from an overgrowth of certain bacteria. This is the most common vaginal infection in women of childbearing age. Treatment is important to prevent complications, especially in pregnant women, as it can cause a premature delivery. CAUSES  Bacterial vaginosis is caused by an increase in harmful bacteria that are normally present in smaller amounts in the vagina. Several different kinds of bacteria can cause bacterial vaginosis. However, the reason that the condition develops is not fully understood. RISK FACTORS Certain activities or behaviors can put you at an increased risk of developing bacterial vaginosis, including:  Having a new sex partner or multiple sex partners.  Douching.  Using an intrauterine device (IUD) for contraception. Women do not get bacterial vaginosis from toilet seats, bedding, swimming pools, or contact with objects around them. SIGNS AND SYMPTOMS  Some women with bacterial vaginosis have no signs or symptoms. Common symptoms include:  Grey vaginal discharge.  A fishlike odor with discharge, especially after sexual intercourse.  Itching or burning of the vagina and vulva.  Burning or pain with urination. DIAGNOSIS  Your health care provider will take a medical history and examine the vagina for signs of bacterial vaginosis. A sample of vaginal fluid may be taken. Your health care provider will look at this sample under a microscope to check for bacteria and abnormal cells. A vaginal pH test may also be done.  TREATMENT  Bacterial vaginosis may be treated with antibiotic medicines. These may be given in the form of a pill or a vaginal cream. A second round of antibiotics may be prescribed if the condition comes back after treatment.  HOME CARE INSTRUCTIONS   Only take over-the-counter or prescription medicines as  directed by your health care provider.  If antibiotic medicine was prescribed, take it as directed. Make sure you finish it even if you start to feel better.  Do not have sex until treatment is completed.  Tell all sexual partners that you have a vaginal infection. They should see their health care provider and be treated if they have problems, such as a mild rash or itching.  Practice safe sex by using condoms and only having one sex partner. SEEK MEDICAL CARE IF:   Your symptoms are not improving after 3 days of treatment.  You have increased discharge or pain.  You have a fever. MAKE SURE YOU:   Understand these instructions.  Will watch your condition.  Will get help right away if you are not doing well or get worse. FOR MORE INFORMATION  Centers for Disease Control and Prevention, Division of STD Prevention: AppraiserFraud.fi American Sexual Health Association (ASHA): www.ashastd.org  Document Released: 10/09/2005 Document Revised: 07/30/2013 Document Reviewed: 05/21/2013 [facility] Patient Information 2015 [place], [place]. This information is not intended to replace advice given to you by your health care provider. Make sure you discuss any questions you have with your health care provider.  Ovarian Cyst An ovarian cyst is a fluid-filled sac that forms on an ovary. The ovaries are small organs that produce eggs in women. Various types of cysts can form on the ovaries. Most are not cancerous. Many do not cause problems, and they often go away on their own. Some may cause symptoms and require treatment. Common types of ovarian cysts include:  Functional cysts--These cysts may occur every month during the menstrual cycle. This is normal. The cysts usually go away with  the next menstrual cycle if the woman does not get pregnant. Usually, there are no symptoms with a functional cyst.  Endometrioma cysts--These cysts form from the tissue that lines the uterus. They are also called  "chocolate cysts" because they become filled with blood that turns brown. This type of cyst can cause pain in the lower abdomen during intercourse and with your menstrual period.  Cystadenoma cysts--This type develops from the cells on the outside of the ovary. These cysts can get very big and cause lower abdomen pain and pain with intercourse. This type of cyst can twist on itself, cut off its blood supply, and cause severe pain. It can also easily rupture and cause a lot of pain.  Dermoid cysts--This type of cyst is sometimes found in both ovaries. These cysts may contain different kinds of body tissue, such as skin, teeth, hair, or cartilage. They usually do not cause symptoms unless they get very big.  Theca lutein cysts--These cysts occur when too much of a certain hormone (human chorionic gonadotropin) is produced and overstimulates the ovaries to produce an egg. This is most common after procedures used to assist with the conception of a baby (in vitro fertilization). CAUSES   Fertility drugs can cause a condition in which multiple large cysts are formed on the ovaries. This is called ovarian hyperstimulation syndrome.  A condition called polycystic ovary syndrome can cause hormonal imbalances that can lead to nonfunctional ovarian cysts. SIGNS AND SYMPTOMS  Many ovarian cysts do not cause symptoms. If symptoms are present, they may include:  Pelvic pain or pressure.  Pain in the lower abdomen.  Pain during sexual intercourse.  Increasing girth (swelling) of the abdomen.  Abnormal menstrual periods.  Increasing pain with menstrual periods.  Stopping having menstrual periods without being pregnant. DIAGNOSIS  These cysts are commonly found during a routine or annual pelvic exam. Tests may be ordered to find out more about the cyst. These tests may include:  Ultrasound.  X-ray of the pelvis.  CT scan.  MRI.  Blood tests. TREATMENT  Many ovarian cysts go away on their own  without treatment. Your health care provider may want to check your cyst regularly for 2-3 months to see if it changes. For women in menopause, it is particularly important to monitor a cyst closely because of the higher rate of ovarian cancer in menopausal women. When treatment is needed, it may include any of the following:  A procedure to drain the cyst (aspiration). This may be done using a long needle and ultrasound. It can also be done through a laparoscopic procedure. This involves using a thin, lighted tube with a tiny camera on the end (laparoscope) inserted through a small incision.  Surgery to remove the whole cyst. This may be done using laparoscopic surgery or an open surgery involving a larger incision in the lower abdomen.  Hormone treatment or birth control pills. These methods are sometimes used to help dissolve a cyst. HOME CARE INSTRUCTIONS   Only take over-the-counter or prescription medicines as directed by your health care provider.  Follow up with your health care provider as directed.  Get regular pelvic exams and Pap tests. SEEK MEDICAL CARE IF:   Your periods are late, irregular, or painful, or they stop.  Your pelvic pain or abdominal pain does not go away.  Your abdomen becomes larger or swollen.  You have pressure on your bladder or trouble emptying your bladder completely.  You have pain during sexual intercourse.  You have feelings of fullness, pressure, or discomfort in your stomach.  You lose weight for no apparent reason.  You feel generally ill.  You become constipated.  You lose your appetite.  You develop acne.  You have an increase in body and facial hair.  You are gaining weight, without changing your exercise and eating habits.  You think you are pregnant. SEEK IMMEDIATE MEDICAL CARE IF:   You have increasing abdominal pain.  You feel sick to your stomach (nauseous), and you throw up (vomit).  You develop a fever that comes on  suddenly.  You have abdominal pain during a bowel movement.  Your menstrual periods become heavier than usual. MAKE SURE YOU:  Understand these instructions.  Will watch your condition.  Will get help right away if you are not doing well or get worse. Document Released: 10/09/2005 Document Revised: 10/14/2013 Document Reviewed: 06/16/2013 [facility] Patient Information 2015 [place], [place]. This information is not intended to replace advice given to you by your health care provider. Make sure you discuss any questions you have with your health care provider.    Emergency Department Resource Guide 1) Find a Doctor and Pay Out of Pocket Although you won't have to find out who is covered by your insurance plan, it is a good idea to ask around and get recommendations. You will then need to call the office and see if the doctor you have chosen will accept you as a new patient and what types of options they offer for patients who are self-pay. Some doctors offer discounts or will set up payment plans for their patients who do not have insurance, but you will need to ask so you aren't surprised when you get to your appointment.  2) Contact Your Local Health Department Not all health departments have doctors that can see patients for sick visits, but many do, so it is worth a call to see if yours does. If you don't know where your local health department is, you can check in your phone book. The CDC also has a tool to help you locate your state's health department, and many state websites also have listings of all of their local health departments.  3) Find a [facility] If your illness is not likely to be very severe or complicated, you may want to try a walk in clinic. These are popping up all over the country in pharmacies, drugstores, and shopping centers. They're usually staffed by nurse practitioners or physician assistants that have been trained to treat common illnesses and complaints.  They're usually fairly quick and inexpensive. However, if you have serious medical issues or chronic medical problems, these are probably not your best option.  No Primary Care Doctor: - Call Health Connect at  [phone] - they can help you locate a primary care doctor that  accepts your insurance, provides certain services, etc. - Physician Referral Service- [phone]  Chronic Pain Problems: Organization         Address  Phone   Notes  [facility]  [phone] Patients need to be referred by their primary care doctor.   Medication Assistance: Organization         Address  Phone   Notes  [facility] Medication [facility] [address] [place], [place] [address] [phone] --Must be a resident of [facility] -- Must have NO insurance coverage whatsoever (no Medicaid/ Medicare, etc.) -- The pt. MUST have a primary care doctor that  directs their care regularly and follows them in the community   MedAssist  [phone]   [employer]  [phone]    Agencies that provide inexpensive medical care: Organization         Address  Phone   Notes  [place]  [phone]   [facility]    [phone]   [facility] [place] [place], [place] [address] [phone]   [place] [address] [place] [phone]   Planned Parenthood    [phone]   [facility]    [phone]   [place] and [place] [address], [place] Phone:  [phone], Fax:  [phone] Hours of Operation:  9 am - 6 pm, M-F.  Also accepts Medicaid/Medicare and self-pay.  [facility] for [place], [address], [place] Phone: [phone], Fax: [phone]. Hours of Operation:  8:30 am - 5:30 pm, M-F.  Also accepts Medicaid and self-pay.  [facility] [place] [address], [place]  Phone: [phone]   [place], [place], [place] [phone], Ext. [phone] Mondays & Thursdays: 7-9 AM.  First 15 patients are seen on a first come, first serve basis.    [place] Providers:  Organization         Address  Phone   Notes  [facility] [address], [place] [phone] Also accepts self-pay patients.  [facility] [address], [place]  [phone]   [place], [address], [place] [phone]   [facility] [address], [place] [phone]   Lucianne Lei [address], [place]   [phone] Only accepts [place] Access [place] patients after they have their name applied to their card.   Self-Pay (no insurance) in [facility]:  Organization         Address  Phone   Notes  Sickle Cell Patients, [facility] [place] [phone]   [facility] [place] [phone]   [facility] [place]  [place], [place], [place] [phone]   [facility]/Dr. Osei-Bonsu  [address] [place] or [address], [place] [phone] Phone number for both [place] and [place] locations is the same.  Urgent Medical and [facility] [address] [place] [phone]   [facility] [address], [place] or [address] [phone]   [facility] [address], [place] [phone], phone; [phone], fax Sees patients 1st and 3rd Saturday of every month.  Must not qualify for public or private insurance (i.e. Medicaid, Medicare, [place] Health Choice, Veterans' Benefits)  Household income should be no more than 200% of the poverty level The clinic cannot  treat you if you are pregnant or think you are pregnant  Sexually transmitted diseases are not treated at the clinic.    Dental Care: Organization         Address  Phone  Notes  [facility] [address] [place], [place] [phone] Accepts children up  to age 75 who are enrolled in Medicaid or [place] Health Choice; pregnant women with a Medicaid card; and children who have applied for Medicaid or [place] Health Choice, but were declined, whose parents can pay a reduced fee at time of service.  [facility] Department of [facility]  [address], [place] [phone] Accepts children up to age 62 who are enrolled in [place] or [place]; pregnant women with a Medicaid card; and children who have applied for Medicaid or [place] Health Choice, but were declined, whose parents can pay a reduced fee at time of service.  [place] Adult Dental Access PROGRAM  [place] [phone] Patients are seen by appointment only. Walk-ins are not accepted. [place] will see patients 84 years of age and older. Monday - Tuesday (8am-5pm) Most Wednesdays (8:30-5pm) $30 per visit, cash only  [facility] Adult Dental Access PROGRAM  [address], [facility] [phone] Patients are seen by appointment only. Walk-ins are not accepted. [place] will see patients 51 years of age and older. One Wednesday Evening (Monthly: Volunteer Based).  $30 per visit, cash only  [place]  [phone] for adults; Children under age 85, call Graduate Pediatric Dentistry at [phone]. Children aged 25-14, please call [phone] to request a pediatric application.  Dental services are provided in all areas of dental care including fillings, crowns and bridges, complete and partial dentures, implants, gum treatment, root canals, and extractions. Preventive care is also provided.  Treatment is provided to both adults and children. Patients are selected via a lottery and there is often a waiting list.   [facility] [address], [place]  [phone] [email or web address]   [facility] [address] [place], [place] [phone], Ext. [phone] Second and Fourth Thursday of each month, opens at 6:30 AM; Clinic ends at 9 AM.  Patients are seen on a first-come first-served basis, and a limited number are seen during each clinic.   [facility]  [address] Hillard Danker [place], [place] [phone]   Eligibility Requirements You must have lived in [place], [place], or [place] counties for at least the last three months.   You cannot be eligible for state or federal sponsored [employer], including [employer], [place], or [employer].   You generally cannot be eligible for healthcare insurance through your employer.    How to apply: Eligibility screenings are held every Tuesday and Wednesday afternoon from 1:00 pm until 4:00 pm. You do not need an appointment for the interview!  [facility] [address] [place], [place]   [place]  [place] Department  [place]  [phone]    Behavioral Health Resources in the Community: Intensive Outpatient Programs Organization         Address  Phone  Notes  [facility] [place]. [address] [place], [place] [phone]   [facility] Outpatient [address] [place], [place]   ADS: Alcohol & Drug Svcs [address], [place], [place]   [place] [address],  [place], [place] or [phone]   Substance Abuse Resources Organization         Address  Phone  Notes  Alcohol and Drug Services  [phone]   [facility]  Associates   [phone]   [facility] [place]   Chinita Pester  [phone]   Residential & Outpatient Substance Abuse Program  [phone]   Psychological Services Organization         Address  Phone  Notes  [facility] [place]  [place]  [phone]   [place] [address], [place] or [phone]    Mobile Crisis Teams Organization         Address  Phone  Notes  [facility]atives, Mobile Crisis Care Unit  [phone]   [facility]  [address] [place], [place]   [place] [address] [place] [phone]    Self-Help/Support Groups Organization         Address  Phone             Notes  T[employer]. of [place] - variety of support groups  [place] Call for more information  Narcotics Anonymous (NA), [facility] [address], [employer] [place]  2 meetings at this location   Special educational needs teacher         Address  Phone  Notes  ASAP Residential Treatment [place],    [place]  [phone]   [facility]  [address], [place] [address], [place], [place]   [place], [place] [phone] Admissions: 8am-3pm M-F  Incentives Substance [place] [address]    [place], [place] [phone]   [facility] [address] [place], [place], [place]   [facility] [address]  [place], [place]   [facility]ight Programs - Intensive Outpatient [address], [place], [place]   [facility] [place].,  [place], [place] [phone] or [phone]   [facility] Services (RTS) [address], [place], [place] Accepts Medicaid  [facility] [place] [address],  [place] [phone] Substance  Abuse/Addiction Treatment   [facility] Organization         Address  Phone  Notes  [facility]  [phone]   Domenic Schwab, PhD [address] Arlis Porta [place], [place]   [phone] or [phone]   [place], [place] [phone]   [facility] [address], [place], [place] [phone] Insurance/Medicaid/sponsorship through [facility] and Families [address], [address] [place]                                    [place], [place] [phone] [place] [address], [place] [phone]    Dr. Adele Schilder  [phone]   [facility] Dept. 1) [address], [place] 2) [address], [place] 3)  [address], [place] [phone]  [phone]   [place] [phone] or [phone] (After Hours)       [place] [email or web address] [place] [address] [place], [place] [phone]    [place] [place], [place] [phone]    [facility] [address] [place], [place] [phone]   [facility] [email or web address] [address] [place], [place] [phone]   [facility] [email or web address] [address] [place], [place] [phone]   [facility] [email or web address], [email or web address] [phone]

## 2014-04-16 NOTE — ED Notes (Signed)
MD at bedside. 

## 2014-04-16 NOTE — ED Notes (Signed)
Patient transported to Ultrasound 

## 2014-04-17 LAB — GC/CHLAMYDIA PROBE AMP
CT Probe RNA: NEGATIVE
GC Probe RNA: NEGATIVE

## 2014-04-20 ENCOUNTER — Encounter (HOSPITAL_COMMUNITY): Payer: Self-pay | Admitting: *Deleted

## 2014-04-20 ENCOUNTER — Inpatient Hospital Stay (HOSPITAL_COMMUNITY)
Admission: AD | Admit: 2014-04-20 | Discharge: 2014-04-21 | Disposition: A | Discharge status: Home / Self Care | Payer: Self-pay | Source: Ambulatory Visit | Attending: Obstetrics & Gynecology | Admitting: Obstetrics & Gynecology

## 2014-04-20 DIAGNOSIS — R102 Pelvic and perineal pain: Secondary | ICD-10-CM

## 2014-04-20 DIAGNOSIS — R1031 Right lower quadrant pain: Secondary | ICD-10-CM | POA: Insufficient documentation

## 2014-04-20 DIAGNOSIS — N949 Unspecified condition associated with female genital organs and menstrual cycle: Secondary | ICD-10-CM | POA: Insufficient documentation

## 2014-04-20 DIAGNOSIS — F17200 Nicotine dependence, unspecified, uncomplicated: Secondary | ICD-10-CM | POA: Insufficient documentation

## 2014-04-20 NOTE — MAU Note (Signed)
PT  SAYS SHE STARTED HAVING ABD PAIN ON Thursday AND FROM HER  JOB- EMPLOYEE  TOOK HER TO [facility] [address]- DID U/S , XRAY.  SHE HAS HX OF CYST-  TOLD HER IF SHE HURTS WORSE TO COME TO [place].  STARTED HURTING WORSE ON Sunday-   SHE TOOK PERCOCET-  LAST TIME AT  12 NOON .   SHE HAD VAG BLEEDING ON Sunday-- AT 3PM AND  STOPPED SOMETIME BEFORE  MORNING.   NO BLEEDING TODAY.  LAST SEX-   MAY 24 TH.  NO BIRTH CONTROL.  HER GYN DR IS IN [place]-  SHE JUST MOVED HERE IN FEB 2015

## 2014-04-21 DIAGNOSIS — N949 Unspecified condition associated with female genital organs and menstrual cycle: Secondary | ICD-10-CM

## 2014-04-21 LAB — CBC
HEMATOCRIT: 33.2 % — AB (ref 36.0–46.0)
HEMOGLOBIN: 11 g/dL — AB (ref 12.0–15.0)
MCH: 27.6 pg (ref 26.0–34.0)
MCHC: 33.1 g/dL (ref 30.0–36.0)
MCV: 83.4 fL (ref 78.0–100.0)
Platelets: 223 10*3/uL (ref 150–400)
RBC: 3.98 MIL/uL (ref 3.87–5.11)
RDW: 13.8 % (ref 11.5–15.5)
WBC: 4.6 10*3/uL (ref 4.0–10.5)

## 2014-04-21 MED ORDER — IBUPROFEN 800 MG PO TABS
800.0000 mg | ORAL_TABLET | Freq: Four times a day (QID) | ORAL | Status: DC | PRN
  Administered 2014-04-21: 800 mg via ORAL
  Filled 2014-04-21: qty 1

## 2014-04-21 MED ORDER — KETOROLAC TROMETHAMINE 10 MG PO TABS
10.0000 mg | ORAL_TABLET | Freq: Four times a day (QID) | ORAL | Status: DC | PRN
Start: 2014-04-21 — End: 2014-07-09

## 2014-04-21 MED ORDER — HYDROMORPHONE HCL 2 MG PO TABS
2.0000 mg | ORAL_TABLET | Freq: Once | ORAL | Status: AC
  Administered 2014-04-21: 2 mg via ORAL
  Filled 2014-04-21: qty 1

## 2014-04-21 NOTE — [facility] Provider Note (Signed)
CC: No chief complaint on file.    First Provider Initiated Contact with Patient 04/21/14 0001      HPI Jodi Mcgee is a 28 y.o. [id number] who presents due to worsening L>R lower abdominal and pelvic pain and red vaginal spotting yesterday. She was seen at [facility] 4 days ago for the abdominal pain that started that day. US showed 3.3 cm probable hemorrhagic cyst on left ovary, but torsion was ruled out. The pain is sharp and constant but waxes and wanes and worse with sitting. She has had similar episodes with ED visits over the last 6 months. She has had ovarian cysts in the past, including aspiration of a hemorrhagic cyst in August 2014 in [place].  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 She has recently moved here and not established care with any provider. She currently rates her pain 9/10; took Percocet at noon without much relief. She has not tried ibuprofen because she knows it won't work. BMs normal. Vomited once during this episode, not nauseated now.  LMP 04/06/14. Not sexually active. Has had intermenstrual bleeding in the past.   Past Medical History  Diagnosis Date  . Ovarian cyst   . Peritonitis, acute generalized   . Sepsis     OB History  Gravida Para Term Preterm AB SAB TAB Ectopic Multiple Living  2 2 2       2     # Outcome Date GA Lbr Len/2nd Weight Sex Delivery Anes PTL Lv  2 TRM     F LTCS   Y  1 TRM     M LTCS   Y      Past Surgical History  Procedure Laterality Date  . Cesarean section    . Ovarian cyst drainage    . Appendectomy      ruptured     History   Social History  . Marital Status: Single    Spouse Name: N/A    Number of Children: N/A  . Years of Education: N/A   Occupational History  . Not on file.   Social History Main Topics  . Smoking status: Current Every Day Smoker -- 0.50 packs/day    Types: Cigarettes  . Smokeless tobacco:  Never Used  . Alcohol Use: No  . Drug Use: No  . Sexual Activity: Not on file   Other Topics Concern  . Not on file   Social History Narrative  . No narrative on file    No current facility-administered medications on file prior to encounter.   Current Outpatient Prescriptions on File Prior to Encounter  Medication Sig Dispense Refill  . acetaminophen (TYLENOL) 500 MG tablet Take 1,500 mg by mouth every 6 (six) hours as needed for moderate pain.      Marland Kitchen albuterol (PROVENTIL HFA;VENTOLIN HFA) 108 (90 BASE) MCG/ACT inhaler Inhale 1-2 puffs into the lungs every 6 (six) hours as needed for wheezing or shortness of breath.  1 Inhaler  0  . dicyclomine (BENTYL) 20 MG tablet Take 1 tablet (20 mg total) by mouth 4 (four) times daily -  before meals and at bedtime.  30 tablet  1  . docusate sodium (COLACE) 100 MG capsule Take 1 capsule (100 mg total) by mouth every 12 (twelve) hours.  30 capsule  0  . ibuprofen (ADVIL,MOTRIN) 800 MG tablet Take 1 tablet (800 mg total) by  mouth every 8 (eight) hours as needed for mild pain.  30 tablet  0  . metroNIDAZOLE (FLAGYL) 500 MG tablet Take 1 tablet (500 mg total) by mouth 2 (two) times daily.  14 tablet  0  . ondansetron (ZOFRAN) 4 MG tablet Take 1 tablet (4 mg total) by mouth every 8 (eight) hours as needed for nausea or vomiting.  20 tablet  0  . oxyCODONE-acetaminophen (PERCOCET/ROXICET) 5-325 MG per tablet Take 1 tablet by mouth every 4 (four) hours as needed.  15 tablet  0  . polyethylene glycol powder (GLYCOLAX/MIRALAX) powder Take 17 g by mouth 3 (three) times daily. Until daily soft stools  OTC  255 g  0  . promethazine (PHENERGAN) 25 MG tablet Take 1 tablet (25 mg total) by mouth every 6 (six) hours as needed for nausea or vomiting.  15 tablet  0    No Known Allergies  ROS Pertinent items in HPI  PHYSICAL EXAM Filed Vitals:   04/20/14 2307  BP: 127/69  Pulse: 82  Temp: 98.9 F (37.2 C)  Resp: 20   General: Obese female in mild  distress Cardiovascular: Normal rate Respiratory: Normal effort Abdomen: Pendulous, with mid vertical scar upper to lower abd with skin distortion. Minimally diffusely tender. Back: No CVAT Extremities: No edema Neurologic: Alert and oriented Speculum exam: NEFG; vagina with physiologic discharge, no blood; cervix clean Bimanual exam: cervix closed, no CMT; uterus NSSP; no adnexal tenderness or masses   LAB RESULTS No results found for this or any previous visit (from the past 24 hour(s)).  IMAGING US Transvaginal Non-ob  04/16/2014   CLINICAL DATA:  Abdominal and pelvic pain for 2-3 days. Increasing in intensity.  EXAM: TRANSABDOMINAL AND TRANSVAGINAL ULTRASOUND OF PELVIS  DOPPLER ULTRASOUND OF OVARIES  TECHNIQUE: Both transabdominal and transvaginal ultrasound examinations of the pelvis were performed. Transabdominal technique was performed for global imaging of the pelvis including uterus, ovaries, adnexal regions, and pelvic cul-de-sac.  It was necessary to proceed with endovaginal exam following the transabdominal exam to visualize the Multiple exams, including endometrium and ovaries. Color and duplex Doppler ultrasound was utilized to evaluate blood flow to the ovaries.  COMPARISON:  Multiple exams, including 10/18/2013  FINDINGS: Uterus  Measurements: 11.0 x 4.7 x 5.1 cm. No fibroids or other mass visualized.  Endometrium  Thickness: 5 mm.  No focal abnormality visualized.  Right ovary  Measurements: 3.9 x 2.5 x 2.2 cm. Normal appearance/no adnexal mass.  Left ovary  Measurements: 5.2 by 4.1 x 5.0 cm. Internal 3.3 x 3.3 x 2.8 cm complex cystic lesion without internal flow, favoring a hemorrhagic cyst.  Pulsed Doppler evaluation of both ovaries demonstrates normal low-resistance arterial and venous waveforms.  Other findings  Trace free pelvic fluid.  IMPRESSION: 1. 3.3 cm complex cystic left ovarian lesion, likely a hemorrhagic cyst. No ovarian torsion identified.   Electronically Signed    By: Sherryl Barters M.D.   On: 04/16/2014 19:42   MAU COURSE Dilaudid 2 mg po and ibuprofen 800 mg po given Care assumed by Marcille Buffy, CNM at 12:30. 0205: Patient states that her pain has improved   ASSESSMENT  1. Pelvic pain in female      PLAN  comfort measures RX: toradol 10mg  #20  Follow-up Information   Follow up with [facility]. (They will call you for a follow up appointment )    Specialty:  Obstetrics and Gynecology   Contact information:   [place] [address]  Welcome, CNM 04/21/2014 12:13 AM

## 2014-04-21 NOTE — Discharge Instructions (Signed)
Ovarian Cyst An ovarian cyst is a fluid-filled sac that forms on an ovary. The ovaries are small organs that produce eggs in women. Various types of cysts can form on the ovaries. Most are not cancerous. Many do not cause problems, and they often go away on their own. Some may cause symptoms and require treatment. Common types of ovarian cysts include:  Functional cysts--These cysts may occur every month during the menstrual cycle. This is normal. The cysts usually go away with the next menstrual cycle if the woman does not get pregnant. Usually, there are no symptoms with a functional cyst.  Endometrioma cysts--These cysts form from the tissue that lines the uterus. They are also called "chocolate cysts" because they become filled with blood that turns brown. This type of cyst can cause pain in the lower abdomen during intercourse and with your menstrual period.  Cystadenoma cysts--This type develops from the cells on the outside of the ovary. These cysts can get very big and cause lower abdomen pain and pain with intercourse. This type of cyst can twist on itself, cut off its blood supply, and cause severe pain. It can also easily rupture and cause a lot of pain.  Dermoid cysts--This type of cyst is sometimes found in both ovaries. These cysts may contain different kinds of body tissue, such as skin, teeth, hair, or cartilage. They usually do not cause symptoms unless they get very big.  Theca lutein cysts--These cysts occur when too much of a certain hormone (human chorionic gonadotropin) is produced and overstimulates the ovaries to produce an egg. This is most common after procedures used to assist with the conception of a baby (in vitro fertilization). CAUSES   Fertility drugs can cause a condition in which multiple large cysts are formed on the ovaries. This is called ovarian hyperstimulation syndrome.  A condition called polycystic ovary syndrome can cause hormonal imbalances that can lead to  nonfunctional ovarian cysts. SIGNS AND SYMPTOMS  Many ovarian cysts do not cause symptoms. If symptoms are present, they may include:  Pelvic pain or pressure.  Pain in the lower abdomen.  Pain during sexual intercourse.  Increasing girth (swelling) of the abdomen.  Abnormal menstrual periods.  Increasing pain with menstrual periods.  Stopping having menstrual periods without being pregnant. DIAGNOSIS  These cysts are commonly found during a routine or annual pelvic exam. Tests may be ordered to find out more about the cyst. These tests may include:  Ultrasound.  X-ray of the pelvis.  CT scan.  MRI.  Blood tests. TREATMENT  Many ovarian cysts go away on their own without treatment. Your health care provider may want to check your cyst regularly for 2-3 months to see if it changes. For women in menopause, it is particularly important to monitor a cyst closely because of the higher rate of ovarian cancer in menopausal women. When treatment is needed, it may include any of the following:  A procedure to drain the cyst (aspiration). This may be done using a long needle and ultrasound. It can also be done through a laparoscopic procedure. This involves using a thin, lighted tube with a tiny camera on the end (laparoscope) inserted through a small incision.  Surgery to remove the whole cyst. This may be done using laparoscopic surgery or an open surgery involving a larger incision in the lower abdomen.  Hormone treatment or birth control pills. These methods are sometimes used to help dissolve a cyst. HOME CARE INSTRUCTIONS   Only take over-the-counter   or prescription medicines as directed by your health care provider.  Follow up with your health care provider as directed.  Get regular pelvic exams and Pap tests. SEEK MEDICAL CARE IF:   Your periods are late, irregular, or painful, or they stop.  Your pelvic pain or abdominal pain does not go away.  Your abdomen becomes  larger or swollen.  You have pressure on your bladder or trouble emptying your bladder completely.  You have pain during sexual intercourse.  You have feelings of fullness, pressure, or discomfort in your stomach.  You lose weight for no apparent reason.  You feel generally ill.  You become constipated.  You lose your appetite.  You develop acne.  You have an increase in body and facial hair.  You are gaining weight, without changing your exercise and eating habits.  You think you are pregnant. SEEK IMMEDIATE MEDICAL CARE IF:   You have increasing abdominal pain.  You feel sick to your stomach (nauseous), and you throw up (vomit).  You develop a fever that comes on suddenly.  You have abdominal pain during a bowel movement.  Your menstrual periods become heavier than usual. MAKE SURE YOU:  Understand these instructions.  Will watch your condition.  Will get help right away if you are not doing well or get worse. Document Released: 10/09/2005 Document Revised: 10/14/2013 Document Reviewed: 06/16/2013 Exit[employer] Patient Information 2015 [employer]. This information is not intended to replace advice given to you by your health care provider. Make sure you discuss any questions you have with your health care provider.  

## 2014-05-20 ENCOUNTER — Encounter: Payer: Self-pay | Admitting: Obstetrics & Gynecology

## 2014-05-20 ENCOUNTER — Telehealth: Payer: Self-pay

## 2014-05-20 NOTE — Telephone Encounter (Signed)
Patient missed today's appointment with Dr. Roselie Awkward. Called patient. No answer. Left message stating we are sorry you missed your appointment today, please call clinic to reschedule.

## 2014-06-26 ENCOUNTER — Emergency Department: Payer: Self-pay | Admitting: Emergency Medicine

## 2014-06-26 LAB — URINALYSIS, COMPLETE
BACTERIA: NONE SEEN
BLOOD: NEGATIVE
Bilirubin,UR: NEGATIVE
GLUCOSE, UR: NEGATIVE mg/dL (ref 0–75)
NITRITE: NEGATIVE
Ph: 5 (ref 4.5–8.0)
Protein: NEGATIVE
Specific Gravity: 1.031 (ref 1.003–1.030)

## 2014-06-26 LAB — CBC WITH DIFFERENTIAL/PLATELET
Basophil #: 0 10*3/uL (ref 0.0–0.1)
Basophil %: 0.7 %
EOS PCT: 1.8 %
Eosinophil #: 0.1 10*3/uL (ref 0.0–0.7)
HCT: 37.5 % (ref 35.0–47.0)
HGB: 11.9 g/dL — AB (ref 12.0–16.0)
Lymphocyte #: 1.7 10*3/uL (ref 1.0–3.6)
Lymphocyte %: 32.8 %
MCH: 26.3 pg (ref 26.0–34.0)
MCHC: 31.7 g/dL — ABNORMAL LOW (ref 32.0–36.0)
MCV: 83 fL (ref 80–100)
Monocyte #: 0.4 x10 3/mm (ref 0.2–0.9)
Monocyte %: 6.8 %
NEUTROS ABS: 3 10*3/uL (ref 1.4–6.5)
NEUTROS PCT: 57.9 %
Platelet: 257 10*3/uL (ref 150–440)
RBC: 4.53 10*6/uL (ref 3.80–5.20)
RDW: 14 % (ref 11.5–14.5)
WBC: 5.2 10*3/uL (ref 3.6–11.0)

## 2014-06-26 LAB — BASIC METABOLIC PANEL
Anion Gap: 8 (ref 7–16)
BUN: 10 mg/dL (ref 7–18)
Calcium, Total: 8.6 mg/dL (ref 8.5–10.1)
Chloride: 106 mmol/L (ref 98–107)
Co2: 29 mmol/L (ref 21–32)
Creatinine: 0.89 mg/dL (ref 0.60–1.30)
EGFR (Non-African Amer.): >60
GLUCOSE: 91 mg/dL (ref 65–99)
Osmolality: 284 (ref 275–301)
POTASSIUM: 3.4 mmol/L — AB (ref 3.5–5.1)
Sodium: 143 mmol/L (ref 136–145)

## 2014-06-26 LAB — PREGNANCY, URINE: Pregnancy Test, Urine: NEGATIVE m[IU]/mL

## 2014-06-26 LAB — WET PREP, GENITAL

## 2014-06-26 LAB — GC/CHLAMYDIA PROBE AMP

## 2014-07-08 DIAGNOSIS — N949 Unspecified condition associated with female genital organs and menstrual cycle: Secondary | ICD-10-CM | POA: Insufficient documentation

## 2014-07-08 DIAGNOSIS — Z3202 Encounter for pregnancy test, result negative: Secondary | ICD-10-CM | POA: Diagnosis not present

## 2014-07-08 DIAGNOSIS — F17200 Nicotine dependence, unspecified, uncomplicated: Secondary | ICD-10-CM | POA: Insufficient documentation

## 2014-07-08 DIAGNOSIS — Z87828 Personal history of other (healed) physical injury and trauma: Secondary | ICD-10-CM | POA: Insufficient documentation

## 2014-07-08 DIAGNOSIS — Z862 Personal history of diseases of the blood and blood-forming organs and certain disorders involving the immune mechanism: Secondary | ICD-10-CM | POA: Diagnosis not present

## 2014-07-08 DIAGNOSIS — Z79899 Other long term (current) drug therapy: Secondary | ICD-10-CM | POA: Insufficient documentation

## 2014-07-08 DIAGNOSIS — Z792 Long term (current) use of antibiotics: Secondary | ICD-10-CM | POA: Diagnosis not present

## 2014-07-08 DIAGNOSIS — N898 Other specified noninflammatory disorders of vagina: Secondary | ICD-10-CM | POA: Insufficient documentation

## 2014-07-08 DIAGNOSIS — K219 Gastro-esophageal reflux disease without esophagitis: Secondary | ICD-10-CM | POA: Diagnosis not present

## 2014-07-08 DIAGNOSIS — F411 Generalized anxiety disorder: Secondary | ICD-10-CM | POA: Diagnosis not present

## 2014-07-08 DIAGNOSIS — N83209 Unspecified ovarian cyst, unspecified side: Secondary | ICD-10-CM | POA: Insufficient documentation

## 2014-07-08 NOTE — ED Notes (Signed)
Patient states that she has had this pain x 2 -3 weeks with the onset of her menstrual period. No longer bleeding but started to have pelvic pain after the bleeding start, patient states that after this she had "pink skin chunks" coming out of her, she f/u with her OB in [place] and was told that she should bette rin 3 -4 say. The patient states that she is now no longer passing the pink tissue but her pelvis continues to hurt.

## 2014-07-09 ENCOUNTER — Emergency Department (HOSPITAL_BASED_OUTPATIENT_CLINIC_OR_DEPARTMENT_OTHER)
Admission: EM | Admit: 2014-07-09 | Discharge: 2014-07-09 | Disposition: A | Discharge status: Home / Self Care | Payer: Medicaid Other | Attending: Emergency Medicine | Admitting: Emergency Medicine

## 2014-07-09 ENCOUNTER — Ambulatory Visit (HOSPITAL_BASED_OUTPATIENT_CLINIC_OR_DEPARTMENT_OTHER)
Admit: 2014-07-09 | Discharge: 2014-07-09 | Disposition: A | Discharge status: Home / Self Care | Payer: Medicaid Other | Attending: Emergency Medicine | Admitting: Emergency Medicine

## 2014-07-09 ENCOUNTER — Emergency Department (HOSPITAL_BASED_OUTPATIENT_CLINIC_OR_DEPARTMENT_OTHER)
Admission: EM | Admit: 2014-07-09 | Discharge: 2014-07-09 | Disposition: A | Discharge status: Home / Self Care | Payer: Medicaid Other | Source: Home / Self Care | Attending: Emergency Medicine | Admitting: Emergency Medicine

## 2014-07-09 ENCOUNTER — Emergency Department (HOSPITAL_BASED_OUTPATIENT_CLINIC_OR_DEPARTMENT_OTHER): Payer: Medicaid Other

## 2014-07-09 ENCOUNTER — Encounter (HOSPITAL_BASED_OUTPATIENT_CLINIC_OR_DEPARTMENT_OTHER): Payer: Self-pay | Admitting: Emergency Medicine

## 2014-07-09 DIAGNOSIS — N83209 Unspecified ovarian cyst, unspecified side: Secondary | ICD-10-CM

## 2014-07-09 DIAGNOSIS — R102 Pelvic and perineal pain: Secondary | ICD-10-CM

## 2014-07-09 DIAGNOSIS — N83201 Unspecified ovarian cyst, right side: Secondary | ICD-10-CM

## 2014-07-09 LAB — CBC WITH DIFFERENTIAL/PLATELET
Basophils Absolute: 0 10*3/uL (ref 0.0–0.1)
Basophils Absolute: 0 10*3/uL (ref 0.0–0.1)
Basophils Relative: 0 % (ref 0–1)
Basophils Relative: 0 % (ref 0–1)
EOS PCT: 2 % (ref 0–5)
EOS PCT: 2 % (ref 0–5)
Eosinophils Absolute: 0.1 10*3/uL (ref 0.0–0.7)
Eosinophils Absolute: 0.1 10*3/uL (ref 0.0–0.7)
HCT: 34.2 % — ABNORMAL LOW (ref 36.0–46.0)
HEMATOCRIT: 33.5 % — AB (ref 36.0–46.0)
Hemoglobin: 11.1 g/dL — ABNORMAL LOW (ref 12.0–15.0)
Hemoglobin: 10.9 g/dL — ABNORMAL LOW (ref 12.0–15.0)
LYMPHS ABS: 1.2 10*3/uL (ref 0.7–4.0)
LYMPHS PCT: 30 % (ref 12–46)
LYMPHS PCT: 28 % (ref 12–46)
Lymphs Abs: 1 10*3/uL (ref 0.7–4.0)
MCH: 26.7 pg (ref 26.0–34.0)
MCH: 26.8 pg (ref 26.0–34.0)
MCHC: 32.5 g/dL (ref 30.0–36.0)
MCHC: 32.5 g/dL (ref 30.0–36.0)
MCV: 82.2 fL (ref 78.0–100.0)
MCV: 82.5 fL (ref 78.0–100.0)
MONO ABS: 0.3 10*3/uL (ref 0.1–1.0)
MONO ABS: 0.3 10*3/uL (ref 0.1–1.0)
MONOS PCT: 7 % (ref 3–12)
Monocytes Relative: 7 % (ref 3–12)
Neutro Abs: 2.5 10*3/uL (ref 1.7–7.7)
Neutro Abs: 2.2 10*3/uL (ref 1.7–7.7)
Neutrophils Relative %: 61 % (ref 43–77)
Neutrophils Relative %: 63 % (ref 43–77)
Platelets: 234 10*3/uL (ref 150–400)
Platelets: 218 10*3/uL (ref 150–400)
RBC: 4.16 MIL/uL (ref 3.87–5.11)
RBC: 4.06 MIL/uL (ref 3.87–5.11)
RDW: 13.7 % (ref 11.5–15.5)
RDW: 14 % (ref 11.5–15.5)
WBC: 4 10*3/uL (ref 4.0–10.5)
WBC: 3.5 10*3/uL — ABNORMAL LOW (ref 4.0–10.5)

## 2014-07-09 LAB — BASIC METABOLIC PANEL
Anion gap: 12 (ref 5–15)
BUN: 9 mg/dL (ref 6–23)
CALCIUM: 8.8 mg/dL (ref 8.4–10.5)
CO2: 24 meq/L (ref 19–32)
CREATININE: 0.8 mg/dL (ref 0.50–1.10)
Chloride: 104 mEq/L (ref 96–112)
GFR calc Af Amer: >90 mL/min (ref >90)
GFR calc non Af Amer: >90 mL/min (ref >90)
GLUCOSE: 106 mg/dL — AB (ref 70–99)
Potassium: 4.1 mEq/L (ref 3.7–5.3)
Sodium: 140 mEq/L (ref 137–147)

## 2014-07-09 LAB — COMPREHENSIVE METABOLIC PANEL
ALBUMIN: 3.1 g/dL — AB (ref 3.5–5.2)
ALK PHOS: 54 U/L (ref 39–117)
ALT: 15 U/L (ref 0–35)
ANION GAP: 12 (ref 5–15)
AST: 15 U/L (ref 0–37)
BILIRUBIN TOTAL: 0.3 mg/dL (ref 0.3–1.2)
BUN: 7 mg/dL (ref 6–23)
CALCIUM: 8.5 mg/dL (ref 8.4–10.5)
CO2: 23 meq/L (ref 19–32)
CREATININE: 0.8 mg/dL (ref 0.50–1.10)
Chloride: 105 mEq/L (ref 96–112)
GFR calc Af Amer: >90 mL/min (ref >90)
GFR calc non Af Amer: >90 mL/min (ref >90)
Glucose, Bld: 125 mg/dL — ABNORMAL HIGH (ref 70–99)
Potassium: 4 mEq/L (ref 3.7–5.3)
Sodium: 140 mEq/L (ref 137–147)
Total Protein: 6.6 g/dL (ref 6.0–8.3)

## 2014-07-09 LAB — URINALYSIS, ROUTINE W REFLEX MICROSCOPIC
BILIRUBIN URINE: NEGATIVE
Glucose, UA: NEGATIVE mg/dL
Hgb urine dipstick: NEGATIVE
Ketones, ur: NEGATIVE mg/dL
Leukocytes, UA: NEGATIVE
NITRITE: NEGATIVE
Protein, ur: NEGATIVE mg/dL
Specific Gravity, Urine: 1.023 (ref 1.005–1.030)
UROBILINOGEN UA: 2 mg/dL — AB (ref 0.0–1.0)
pH: 6.5 (ref 5.0–8.0)

## 2014-07-09 LAB — WET PREP, GENITAL
Clue Cells Wet Prep HPF POC: NONE SEEN
Trich, Wet Prep: NONE SEEN
WBC, Wet Prep HPF POC: NONE SEEN
Yeast Wet Prep HPF POC: NONE SEEN

## 2014-07-09 LAB — PREGNANCY, URINE: Preg Test, Ur: NEGATIVE

## 2014-07-09 LAB — GC/CHLAMYDIA PROBE AMP
CT PROBE, AMP APTIMA: NEGATIVE
GC PROBE AMP APTIMA: NEGATIVE

## 2014-07-09 MED ORDER — DIPHENHYDRAMINE HCL 50 MG/ML IJ SOLN
25.0000 mg | Freq: Once | INTRAMUSCULAR | Status: AC
Start: 2014-07-09 — End: 2014-07-09
  Administered 2014-07-09: 25 mg via INTRAVENOUS
  Filled 2014-07-09: qty 1

## 2014-07-09 MED ORDER — NAPROXEN 500 MG PO TABS: 500.0000 mg | ORAL_TABLET | Freq: Two times a day (BID) | ORAL | Status: DC

## 2014-07-09 MED ORDER — OXYCODONE-ACETAMINOPHEN 5-325 MG PO TABS: 1.0000 | ORAL_TABLET | Freq: Four times a day (QID) | ORAL | Status: DC | PRN

## 2014-07-09 MED ORDER — HYDROMORPHONE HCL 1 MG/ML IJ SOLN
1.0000 mg | INTRAMUSCULAR | Status: DC | PRN
  Administered 2014-07-09 (×2): 1 mg via INTRAVENOUS
  Filled 2014-07-09 (×2): qty 1

## 2014-07-09 MED ORDER — SODIUM CHLORIDE 0.9 % IV SOLN
1000.0000 mL | INTRAVENOUS | Status: DC
  Administered 2014-07-09: 1000 mL via INTRAVENOUS

## 2014-07-09 MED ORDER — CEFTRIAXONE SODIUM 250 MG IJ SOLR
250.0000 mg | Freq: Once | INTRAMUSCULAR | Status: AC
  Administered 2014-07-09: 250 mg via INTRAMUSCULAR
  Filled 2014-07-09: qty 250

## 2014-07-09 MED ORDER — LIDOCAINE HCL (PF) 1 % IJ SOLN
INTRAMUSCULAR | Status: AC
  Administered 2014-07-09: 0.9 mL
  Filled 2014-07-09: qty 5

## 2014-07-09 MED ORDER — ONDANSETRON HCL 4 MG/2ML IJ SOLN
4.0000 mg | Freq: Once | INTRAMUSCULAR | Status: AC
Start: 2014-07-09 — End: 2014-07-09
  Administered 2014-07-09: 4 mg via INTRAVENOUS
  Filled 2014-07-09: qty 2

## 2014-07-09 MED ORDER — HYDROMORPHONE HCL 1 MG/ML IJ SOLN
2.0000 mg | Freq: Once | INTRAMUSCULAR | Status: DC
Start: 2014-07-09 — End: 2014-07-09

## 2014-07-09 MED ORDER — IOHEXOL 300 MG/ML  SOLN
100.0000 mL | Freq: Once | INTRAMUSCULAR | Status: AC | PRN
Start: 2014-07-09 — End: 2014-07-09
  Administered 2014-07-09: 100 mL via INTRAVENOUS

## 2014-07-09 MED ORDER — SODIUM CHLORIDE 0.9 % IV SOLN
1000.0000 mL | Freq: Once | INTRAVENOUS | Status: AC
  Administered 2014-07-09: 1000 mL via INTRAVENOUS

## 2014-07-09 MED ORDER — HYDROMORPHONE HCL 1 MG/ML IJ SOLN
1.0000 mg | Freq: Once | INTRAMUSCULAR | Status: AC
  Administered 2014-07-09: 1 mg via INTRAVENOUS
  Filled 2014-07-09: qty 1

## 2014-07-09 MED ORDER — SODIUM CHLORIDE 0.9 % IV BOLUS (SEPSIS)
1000.0000 mL | Freq: Once | INTRAVENOUS | Status: AC
  Administered 2014-07-09: 1000 mL via INTRAVENOUS

## 2014-07-09 MED ORDER — DIPHENHYDRAMINE HCL 25 MG PO CAPS
25.0000 mg | ORAL_CAPSULE | Freq: Once | ORAL | Status: AC
  Administered 2014-07-09: 25 mg via ORAL
  Filled 2014-07-09: qty 1

## 2014-07-09 MED ORDER — DOXYCYCLINE HYCLATE 100 MG PO CAPS: 100.0000 mg | ORAL_CAPSULE | Freq: Two times a day (BID) | ORAL | Status: DC

## 2014-07-09 NOTE — ED Notes (Signed)
Entered pt room and she was talking on cell phone.  Informed pt I will return when she is off the phone.

## 2014-07-09 NOTE — ED Notes (Signed)
Patient instructed to call family member for ride home.

## 2014-07-09 NOTE — ED Notes (Signed)
Pt c/o increased pelvic pain. Pt just had [place] completed showing right ovarian cyst. Sts vomiting and more pain

## 2014-07-09 NOTE — ED Provider Notes (Signed)
CSN: [id number]     Arrival date & time 07/08/14  2352 History   First MD Initiated Contact with Patient 07/09/14 0009     Chief Complaint  Patient presents with  . Pelvic Pain     (Consider location/radiation/quality/duration/timing/severity/associated sxs/prior Treatment) HPI Comments: Pt comes in w/ pelvic pain. Pt has had pelvic pain for few days now, and had an ED visit, with a US showing left sided ovarian cyst, complex, followed by Gyne visit, when she was diagnosed as PID and received doxy x 10-14 days. PT reports that despite the antibiotics, her pain has persisted. Currently, pain is worse in the suprapubic and RLQ region, although the entire lower quadrant area hurts. Pain has gotten worse over the last 2 days. She has pink discharge. No hx of STD and no risk factors for the same.  Patient is a 28 y.o. female presenting with pelvic pain. The history is provided by the patient.  Pelvic Pain Pertinent negatives include no chest pain, no abdominal pain, no headaches and no shortness of breath.    Past Medical History  Diagnosis Date  . Ovarian cyst   . Peritonitis, acute generalized   . Sepsis    Past Surgical History  Procedure Laterality Date  . Cesarean section    . Ovarian cyst drainage    . Appendectomy      ruptured    History reviewed. No pertinent family history. History  Substance Use Topics  . Smoking status: Current Every Day Smoker -- 0.50 packs/day    Types: Cigarettes  . Smokeless tobacco: Never Used  . Alcohol Use: No   OB History   Grav Para Term Preterm Abortions TAB SAB Ect Mult Living   2 2 2       2      Review of Systems  Constitutional: Negative for activity change.  Respiratory: Negative for shortness of breath.   Cardiovascular: Negative for chest pain.  Gastrointestinal: Negative for nausea, vomiting and abdominal pain.  Genitourinary: Positive for vaginal discharge and pelvic pain. Negative for dysuria, frequency, hematuria and flank  pain.  Musculoskeletal: Negative for neck pain.  Neurological: Negative for headaches.      Allergies  Review of patient's allergies indicates no known allergies.  Home Medications   Prior to Admission medications   Medication Sig Start Date End Date Taking? Authorizing Provider  acetaminophen (TYLENOL) 500 MG tablet Take 1,500 mg by mouth every 6 (six) hours as needed for moderate pain.    Historical Provider, MD  albuterol (PROVENTIL HFA;VENTOLIN HFA) 108 (90 BASE) MCG/ACT inhaler Inhale 1-2 puffs into the lungs every 6 (six) hours as needed for wheezing or shortness of breath. 11/17/13   Ruthell Rummage Dammen, PA-C  dicyclomine (BENTYL) 20 MG tablet Take 1 tablet (20 mg total) by mouth 4 (four) times daily -  before meals and at bedtime. 10/18/13 10/18/14  Manya Silvas, CNM  docusate sodium (COLACE) 100 MG capsule Take 1 capsule (100 mg total) by mouth every 12 (twelve) hours. 01/07/14   Antonietta Breach, PA-C  ibuprofen (ADVIL,MOTRIN) 800 MG tablet Take 1 tablet (800 mg total) by mouth every 8 (eight) hours as needed for mild pain. 04/16/14   Kristen N Ward, DO  ketorolac (TORADOL) 10 MG tablet Take 1 tablet (10 mg total) by mouth every 6 (six) hours as needed. 04/21/14   Heather Erby Pian, CNM  metroNIDAZOLE (FLAGYL) 500 MG tablet Take 1 tablet (500 mg total) by mouth 2 (two) times daily. 04/16/14  Kristen N Ward, DO  ondansetron (ZOFRAN) 4 MG tablet Take 1 tablet (4 mg total) by mouth every 8 (eight) hours as needed for nausea or vomiting. 01/07/14   Antonietta Breach, PA-C  oxyCODONE-acetaminophen (PERCOCET/ROXICET) 5-325 MG per tablet Take 1 tablet by mouth every 4 (four) hours as needed. 04/16/14   Kristen N Ward, DO  polyethylene glycol powder (GLYCOLAX/MIRALAX) powder Take 17 g by mouth 3 (three) times daily. Until daily soft stools  OTC 01/07/14   Antonietta Breach, PA-C  promethazine (PHENERGAN) 25 MG tablet Take 1 tablet (25 mg total) by mouth every 6 (six) hours as needed for nausea or vomiting.  04/16/14   Kristen N Ward, DO   BP 107/66  Pulse 73  Temp(Src) 98 F (36.7 C) (Oral)  Resp 18  Wt 210 lb (95.255 kg)  SpO2 98% Physical Exam  Nursing note and vitals reviewed. Constitutional: She is oriented to person, place, and time. She appears well-developed and well-nourished.  HENT:  Head: Normocephalic and atraumatic.  Eyes: Conjunctivae and EOM are normal. Pupils are equal, round, and reactive to light.  Neck: Normal range of motion. Neck supple.  Cardiovascular: Normal rate, regular rhythm, normal heart sounds and intact distal pulses.   No murmur heard. Pulmonary/Chest: Effort normal. No respiratory distress. She has no wheezes.  Abdominal: Soft. Bowel sounds are normal. She exhibits no distension. There is tenderness. There is no rebound and no guarding.  Lower quadrant tenderness, worst in the suprapubic region  Genitourinary: Vagina normal and uterus normal.  External exam - normal, no lesions Speculum exam: Pt has some white discharge, no blood Bimanual exam: Patient has + CMT, no adnexal tenderness or fullness and cervical os is closed  Neurological: She is alert and oriented to person, place, and time.  Skin: Skin is warm and dry.    ED Course  Procedures (including critical care time) Labs Review Labs Reviewed  URINALYSIS, ROUTINE W REFLEX MICROSCOPIC - Abnormal; Notable for the following:    Urobilinogen, UA 2.0 (*)    All other components within normal limits  CBC WITH DIFFERENTIAL - Abnormal; Notable for the following:    Hemoglobin 11.1 (*)    HCT 34.2 (*)    All other components within normal limits  BASIC METABOLIC PANEL - Abnormal; Notable for the following:    Glucose, Bld 106 (*)    All other components within normal limits  WET PREP, GENITAL  GC/CHLAMYDIA PROBE AMP  PREGNANCY, URINE    Imaging Review Ct Abdomen Pelvis W Contrast  07/09/2014   CLINICAL DATA:  Pelvic pain.  EXAM: CT ABDOMEN AND PELVIS WITH CONTRAST  TECHNIQUE: Multidetector CT  imaging of the abdomen and pelvis was performed using the standard protocol following bolus administration of intravenous contrast.  CONTRAST:  171mL OMNIPAQUE IOHEXOL 300 MG/ML  SOLN  COMPARISON:  Pelvic ultrasound performed 06/26/2014  FINDINGS: The visualized lung bases are clear.  The liver and spleen are unremarkable in appearance. The gallbladder is within normal limits. The pancreas and adrenal glands are unremarkable.  The kidneys are unremarkable in appearance. There is no evidence of hydronephrosis. No renal or ureteral stones are seen. No perinephric stranding is appreciated.  No free fluid is identified. The small bowel is unremarkable in appearance. The stomach is within normal limits. No acute vascular abnormalities are seen.  The patient is status post appendectomy. The colon is unremarkable in appearance. An apparent clip is noted at the left lower quadrant, of uncertain significance.  Postoperative scarring is  noted along the midline anterior abdominal wall.  The bladder is mildly distended and grossly unremarkable. The uterus is unremarkable in appearance. There is a 4.4 cm right adnexal cystic lesion, somewhat hazy in appearance. The left ovary is unremarkable in appearance. No inguinal lymphadenopathy is seen.  No acute osseous abnormalities are identified.  IMPRESSION: 4.4 cm right adnexal cystic lesion, somewhat hazy in appearance. Pelvic ultrasound would be helpful for further evaluation; as this is new from the recent prior study, and given the patient's history of hemorrhagic cyst, this may reflect a new hemorrhagic cyst.   Electronically Signed   By: Garald Balding M.D.   On: 07/09/2014 02:31     EKG Interpretation None      MDM   Final diagnoses:  Pelvic pain in female  Cyst of right ovary    Pt comes in with cc of lower quadrant abd pain. She had persistent pain, slowly getting worse, and had some Cervical motion tenderness on exam. Ct was ordered, as there is no follow  up available for the patient, and recent gyne workup and evaluation. CT shows NEW cyst on the right side, 4.4 cm. Pt has moderately severe pain, and it is not sudden onset. I doubt torsion clinically. But, given the size, [place] repeat has been ordered, and Dr. Filbert Berthold to see if pt can be seen by gyne sooner.    Varney Biles, MD 07/09/14 [phone]

## 2014-07-09 NOTE — Discharge Instructions (Signed)
We saw you in the ER for the pelvic pain. You have right sided cyst now, PLEASE SEE THE GYNECOLOGIST AS SOON AS POSSIBLE.  We have ordered repeat Ultrasound, so please get that study completed tomorrow.   Ovarian Cyst An ovarian cyst is a fluid-filled sac that forms on an ovary. The ovaries are small organs that produce eggs in women. Various types of cysts can form on the ovaries. Most are not cancerous. Many do not cause problems, and they often go away on their own. Some may cause symptoms and require treatment. Common types of ovarian cysts include:  Functional cysts--These cysts may occur every month during the menstrual cycle. This is normal. The cysts usually go away with the next menstrual cycle if the woman does not get pregnant. Usually, there are no symptoms with a functional cyst.  Endometrioma cysts--These cysts form from the tissue that lines the uterus. They are also called "chocolate cysts" because they become filled with blood that turns brown. This type of cyst can cause pain in the lower abdomen during intercourse and with your menstrual period.  Cystadenoma cysts--This type develops from the cells on the outside of the ovary. These cysts can get very big and cause lower abdomen pain and pain with intercourse. This type of cyst can twist on itself, cut off its blood supply, and cause severe pain. It can also easily rupture and cause a lot of pain.  Dermoid cysts--This type of cyst is sometimes found in both ovaries. These cysts may contain different kinds of body tissue, such as skin, teeth, hair, or cartilage. They usually do not cause symptoms unless they get very big.  Theca lutein cysts--These cysts occur when too much of a certain hormone (human chorionic gonadotropin) is produced and overstimulates the ovaries to produce an egg. This is most common after procedures used to assist with the conception of a baby (in vitro fertilization). CAUSES   Fertility drugs can cause a  condition in which multiple large cysts are formed on the ovaries. This is called ovarian hyperstimulation syndrome.  A condition called polycystic ovary syndrome can cause hormonal imbalances that can lead to nonfunctional ovarian cysts. SIGNS AND SYMPTOMS  Many ovarian cysts do not cause symptoms. If symptoms are present, they may include:  Pelvic pain or pressure.  Pain in the lower abdomen.  Pain during sexual intercourse.  Increasing girth (swelling) of the abdomen.  Abnormal menstrual periods.  Increasing pain with menstrual periods.  Stopping having menstrual periods without being pregnant. DIAGNOSIS  These cysts are commonly found during a routine or annual pelvic exam. Tests may be ordered to find out more about the cyst. These tests may include:  Ultrasound.  X-ray of the pelvis.  CT scan.  MRI.  Blood tests. TREATMENT  Many ovarian cysts go away on their own without treatment. Your health care provider may want to check your cyst regularly for 2-3 months to see if it changes. For women in menopause, it is particularly important to monitor a cyst closely because of the higher rate of ovarian cancer in menopausal women. When treatment is needed, it may include any of the following:  A procedure to drain the cyst (aspiration). This may be done using a long needle and ultrasound. It can also be done through a laparoscopic procedure. This involves using a thin, lighted tube with a tiny camera on the end (laparoscope) inserted through a small incision.  Surgery to remove the whole cyst. This may be done using  laparoscopic surgery or an open surgery involving a larger incision in the lower abdomen.  Hormone treatment or birth control pills. These methods are sometimes used to help dissolve a cyst. HOME CARE INSTRUCTIONS   Only take over-the-counter or prescription medicines as directed by your health care provider.  Follow up with your health care provider as  directed.  Get regular pelvic exams and Pap tests. SEEK MEDICAL CARE IF:   Your periods are late, irregular, or painful, or they stop.  Your pelvic pain or abdominal pain does not go away.  Your abdomen becomes larger or swollen.  You have pressure on your bladder or trouble emptying your bladder completely.  You have pain during sexual intercourse.  You have feelings of fullness, pressure, or discomfort in your stomach.  You lose weight for no apparent reason.  You feel generally ill.  You become constipated.  You lose your appetite.  You develop acne.  You have an increase in body and facial hair.  You are gaining weight, without changing your exercise and eating habits.  You think you are pregnant. SEEK IMMEDIATE MEDICAL CARE IF:   You have increasing abdominal pain.  You feel sick to your stomach (nauseous), and you throw up (vomit).  You develop a fever that comes on suddenly.  You have abdominal pain during a bowel movement.  Your menstrual periods become heavier than usual. MAKE SURE YOU:  Understand these instructions.  Will watch your condition.  Will get help right away if you are not doing well or get worse. Document Released: 10/09/2005 Document Revised: 10/14/2013 Document Reviewed: 06/16/2013 [facility] Patient Information 2015 [place], [place]. This information is not intended to replace advice given to you by your health care provider. Make sure you discuss any questions you have with your health care provider.

## 2014-07-09 NOTE — ED Notes (Signed)
MD at bedside. 

## 2014-07-09 NOTE — ED Notes (Signed)
Pt c/o itching all over. No rash noted. Dr Tomi Bamberger aware. Orders given.

## 2014-07-09 NOTE — Discharge Instructions (Signed)
Ovarian Cyst An ovarian cyst is a fluid-filled sac that forms on an ovary. The ovaries are small organs that produce eggs in women. Various types of cysts can form on the ovaries. Most are not cancerous. Many do not cause problems, and they often go away on their own. Some may cause symptoms and require treatment. Common types of ovarian cysts include:  Functional cysts--These cysts may occur every month during the menstrual cycle. This is normal. The cysts usually go away with the next menstrual cycle if the woman does not get pregnant. Usually, there are no symptoms with a functional cyst.  Endometrioma cysts--These cysts form from the tissue that lines the uterus. They are also called "chocolate cysts" because they become filled with blood that turns brown. This type of cyst can cause pain in the lower abdomen during intercourse and with your menstrual period.  Cystadenoma cysts--This type develops from the cells on the outside of the ovary. These cysts can get very big and cause lower abdomen pain and pain with intercourse. This type of cyst can twist on itself, cut off its blood supply, and cause severe pain. It can also easily rupture and cause a lot of pain.  Dermoid cysts--This type of cyst is sometimes found in both ovaries. These cysts may contain different kinds of body tissue, such as skin, teeth, hair, or cartilage. They usually do not cause symptoms unless they get very big.  Theca lutein cysts--These cysts occur when too much of a certain hormone (human chorionic gonadotropin) is produced and overstimulates the ovaries to produce an egg. This is most common after procedures used to assist with the conception of a baby (in vitro fertilization). CAUSES   Fertility drugs can cause a condition in which multiple large cysts are formed on the ovaries. This is called ovarian hyperstimulation syndrome.  A condition called polycystic ovary syndrome can cause hormonal imbalances that can lead to  nonfunctional ovarian cysts. SIGNS AND SYMPTOMS  Many ovarian cysts do not cause symptoms. If symptoms are present, they may include:  Pelvic pain or pressure.  Pain in the lower abdomen.  Pain during sexual intercourse.  Increasing girth (swelling) of the abdomen.  Abnormal menstrual periods.  Increasing pain with menstrual periods.  Stopping having menstrual periods without being pregnant. DIAGNOSIS  These cysts are commonly found during a routine or annual pelvic exam. Tests may be ordered to find out more about the cyst. These tests may include:  Ultrasound.  X-ray of the pelvis.  CT scan.  MRI.  Blood tests. TREATMENT  Many ovarian cysts go away on their own without treatment. Your health care provider may want to check your cyst regularly for 2-3 months to see if it changes. For women in menopause, it is particularly important to monitor a cyst closely because of the higher rate of ovarian cancer in menopausal women. When treatment is needed, it may include any of the following:  A procedure to drain the cyst (aspiration). This may be done using a long needle and ultrasound. It can also be done through a laparoscopic procedure. This involves using a thin, lighted tube with a tiny camera on the end (laparoscope) inserted through a small incision.  Surgery to remove the whole cyst. This may be done using laparoscopic surgery or an open surgery involving a larger incision in the lower abdomen.  Hormone treatment or birth control pills. These methods are sometimes used to help dissolve a cyst. HOME CARE INSTRUCTIONS   Only take over-the-counter   or prescription medicines as directed by your health care provider.  Follow up with your health care provider as directed.  Get regular pelvic exams and Pap tests. SEEK MEDICAL CARE IF:   Your periods are late, irregular, or painful, or they stop.  Your pelvic pain or abdominal pain does not go away.  Your abdomen becomes  larger or swollen.  You have pressure on your bladder or trouble emptying your bladder completely.  You have pain during sexual intercourse.  You have feelings of fullness, pressure, or discomfort in your stomach.  You lose weight for no apparent reason.  You feel generally ill.  You become constipated.  You lose your appetite.  You develop acne.  You have an increase in body and facial hair.  You are gaining weight, without changing your exercise and eating habits.  You think you are pregnant. SEEK IMMEDIATE MEDICAL CARE IF:   You have increasing abdominal pain.  You feel sick to your stomach (nauseous), and you throw up (vomit).  You develop a fever that comes on suddenly.  You have abdominal pain during a bowel movement.  Your menstrual periods become heavier than usual. MAKE SURE YOU:  Understand these instructions.  Will watch your condition.  Will get help right away if you are not doing well or get worse. Document Released: 10/09/2005 Document Revised: 10/14/2013 Document Reviewed: 06/16/2013 Exit[employer] Patient Information 2015 [employer]. This information is not intended to replace advice given to you by your health care provider. Make sure you discuss any questions you have with your health care provider.  

## 2014-07-09 NOTE — ED Provider Notes (Signed)
CSN: [id number]     Arrival date & time 07/09/14  1608 History   First MD Initiated Contact with Patient 07/09/14 1623     Chief Complaint  Patient presents with  . Pelvic Pain    HPI Symptoms started two weeks ago and was seen in an ED in [place].  She had pain in the pelvic region.  She had an ultrasound showing a left ovarian cyst.  She was started on abx to cover for possible PID and followed up with a GYN doctor.    Pt continued to have some mild discharge.  Her symptoms worsened about 4 days ago .  She was seen at [facility] ED last night.  SHe had a CT scan that showed a 4.4 cm cyst on the right side.  She did have CMT on exam. She was told to come back to the ED for an [place]. Today.  She decided to check back in bc she is still having pain and some vomiting as well.  No fevers.  The pain is in the lower abdomen, pelvic region.  It is sharp.  She has had vomiting with it.  No diarrhea or dysuria. Past Medical History  Diagnosis Date  . Ovarian cyst   . Peritonitis, acute generalized   . Sepsis    Past Surgical History  Procedure Laterality Date  . Cesarean section    . Ovarian cyst drainage    . Appendectomy      ruptured    No family history on file. History  Substance Use Topics  . Smoking status: Current Every Day Smoker -- 0.50 packs/day    Types: Cigarettes  . Smokeless tobacco: Never Used  . Alcohol Use: No   OB History   Grav Para Term Preterm Abortions TAB SAB Ect Mult Living   2 2 2       2      Review of Systems  All other systems reviewed and are negative.     Allergies  Review of patient's allergies indicates no known allergies.  Home Medications   Prior to Admission medications   Medication Sig Start Date End Date Taking? Authorizing Provider  acetaminophen (TYLENOL) 500 MG tablet Take 1,500 mg by mouth every 6 (six) hours as needed for moderate pain.    Historical Provider, MD  albuterol (PROVENTIL HFA;VENTOLIN HFA) 108 (90 BASE) MCG/ACT inhaler Inhale 1-2  puffs into the lungs every 6 (six) hours as needed for wheezing or shortness of breath. 11/17/13   Ruthell Rummage Dammen, PA-C  dicyclomine (BENTYL) 20 MG tablet Take 1 tablet (20 mg total) by mouth 4 (four) times daily -  before meals and at bedtime. 10/18/13 10/18/14  Manya Silvas, CNM  docusate sodium (COLACE) 100 MG capsule Take 1 capsule (100 mg total) by mouth every 12 (twelve) hours. 01/07/14   Antonietta Breach, PA-C  ibuprofen (ADVIL,MOTRIN) 800 MG tablet Take 1 tablet (800 mg total) by mouth every 8 (eight) hours as needed for mild pain. 04/16/14   Kristen N Ward, DO  ketorolac (TORADOL) 10 MG tablet Take 1 tablet (10 mg total) by mouth every 6 (six) hours as needed. 04/21/14   Heather Erby Pian, CNM  metroNIDAZOLE (FLAGYL) 500 MG tablet Take 1 tablet (500 mg total) by mouth 2 (two) times daily. 04/16/14   Kristen N Ward, DO  ondansetron (ZOFRAN) 4 MG tablet Take 1 tablet (4 mg total) by mouth every 8 (eight) hours as needed for nausea or vomiting. 01/07/14   Antonietta Breach,  PA-C  oxyCODONE-acetaminophen (PERCOCET/ROXICET) 5-325 MG per tablet Take 1 tablet by mouth every 4 (four) hours as needed. 04/16/14   Kristen N Ward, DO  polyethylene glycol powder (GLYCOLAX/MIRALAX) powder Take 17 g by mouth 3 (three) times daily. Until daily soft stools  OTC 01/07/14   Antonietta Breach, PA-C  promethazine (PHENERGAN) 25 MG tablet Take 1 tablet (25 mg total) by mouth every 6 (six) hours as needed for nausea or vomiting. 04/16/14   Kristen N Ward, DO   BP 119/91  Pulse 80  Temp(Src) 98 F (36.7 C) (Oral)  Resp 18  Ht 5\' 7"  (1.702 m)  Wt 210 lb (95.255 kg)  BMI 32.88 kg/m2  SpO2 100%  LMP 06/24/2014 Physical Exam  Nursing note and vitals reviewed. Constitutional: She appears well-developed and well-nourished. No distress.  HENT:  Head: Normocephalic and atraumatic.  Right Ear: External ear normal.  Left Ear: External ear normal.  Eyes: Conjunctivae are normal. Right eye exhibits no discharge. Left eye exhibits no  discharge. No scleral icterus.  Neck: Neck supple. No tracheal deviation present.  Cardiovascular: Normal rate, regular rhythm and intact distal pulses.   Pulmonary/Chest: Effort normal and breath sounds normal. No stridor. No respiratory distress. She has no wheezes. She has no rales.  Abdominal: Soft. Bowel sounds are normal. She exhibits no distension. There is tenderness. There is no rigidity, no rebound and no guarding. No hernia.  Genitourinary:  Pelvic exam deferred, just performed recent visit earlier this morning  Musculoskeletal: She exhibits no edema and no tenderness.  Neurological: She is alert. She has normal strength. No cranial nerve deficit (no facial droop, extraocular movements intact, no slurred speech) or sensory deficit. She exhibits normal muscle tone. She displays no seizure activity. Coordination normal.  Skin: Skin is warm and dry. No rash noted.  Psychiatric: She has a normal mood and affect.    ED Course  Procedures (including critical care time) Labs Review Labs Reviewed  CBC WITH DIFFERENTIAL - Abnormal; Notable for the following:    WBC 3.5 (*)    Hemoglobin 10.9 (*)    HCT 33.5 (*)    All other components within normal limits  COMPREHENSIVE METABOLIC PANEL    Imaging Review Ct Abdomen Pelvis W Contrast  07/09/2014   CLINICAL DATA:  Pelvic pain.  EXAM: CT ABDOMEN AND PELVIS WITH CONTRAST  TECHNIQUE: Multidetector CT imaging of the abdomen and pelvis was performed using the standard protocol following bolus administration of intravenous contrast.  CONTRAST:  159mL OMNIPAQUE IOHEXOL 300 MG/ML  SOLN  COMPARISON:  Pelvic ultrasound performed 06/26/2014  FINDINGS: The visualized lung bases are clear.  The liver and spleen are unremarkable in appearance. The gallbladder is within normal limits. The pancreas and adrenal glands are unremarkable.  The kidneys are unremarkable in appearance. There is no evidence of hydronephrosis. No renal or ureteral stones are seen.  No perinephric stranding is appreciated.  No free fluid is identified. The small bowel is unremarkable in appearance. The stomach is within normal limits. No acute vascular abnormalities are seen.  The patient is status post appendectomy. The colon is unremarkable in appearance. An apparent clip is noted at the left lower quadrant, of uncertain significance.  Postoperative scarring is noted along the midline anterior abdominal wall.  The bladder is mildly distended and grossly unremarkable. The uterus is unremarkable in appearance. There is a 4.4 cm right adnexal cystic lesion, somewhat hazy in appearance. The left ovary is unremarkable in appearance. No inguinal lymphadenopathy is seen.  No acute osseous abnormalities are identified.  IMPRESSION: 4.4 cm right adnexal cystic lesion, somewhat hazy in appearance. Pelvic ultrasound would be helpful for further evaluation; as this is new from the recent prior study, and given the patient's history of hemorrhagic cyst, this may reflect a new hemorrhagic cyst.   Electronically Signed   By: Garald Balding M.D.   On: 07/09/2014 02:31    Medications  0.9 %  sodium chloride infusion (1,000 mLs Intravenous New Bag/Given 07/09/14 1650)    Followed by  0.9 %  sodium chloride infusion (not administered)  HYDROmorphone (DILAUDID) injection 1 mg (1 mg Intravenous Given 07/09/14 1650)  ondansetron (ZOFRAN) injection 4 mg (4 mg Intravenous Given 07/09/14 1650)     MDM   Final diagnoses:  Hemorrhagic ovarian cyst    The patient had an ultrasound performed as an outpatient. The results show an inhomogeneous mass within the right ovary. She has a similar appearing 1.5 cm structure in the left ovary. These suggest hemorrhagic cysts. Followup ultrasound in 2 months is recommended.  Pelvic cultures were sent for analysis last evening. Considering her complaints of discharge I will have her resume a course of antibiotics. She has been referred to gynecology. I will  prescribe her medications for pain to take .  Patient states she has been taking Percocet for the last 4 months. I would give her a short course of medications for that and encouraged her to follow up with a gynecologist considering the long term duration of her symptoms.    Dorie Rank, MD 07/09/14 [phone]

## 2014-07-12 ENCOUNTER — Inpatient Hospital Stay (HOSPITAL_COMMUNITY): Payer: Medicaid Other

## 2014-07-12 ENCOUNTER — Inpatient Hospital Stay (HOSPITAL_COMMUNITY)
Admission: AD | Admit: 2014-07-12 | Discharge: 2014-07-13 | Disposition: A | Discharge status: Home / Self Care | Payer: Medicaid Other | Source: Ambulatory Visit | Attending: Obstetrics and Gynecology | Admitting: Obstetrics and Gynecology

## 2014-07-12 ENCOUNTER — Encounter (HOSPITAL_COMMUNITY): Payer: Self-pay | Admitting: *Deleted

## 2014-07-12 DIAGNOSIS — N949 Unspecified condition associated with female genital organs and menstrual cycle: Secondary | ICD-10-CM

## 2014-07-12 DIAGNOSIS — F17200 Nicotine dependence, unspecified, uncomplicated: Secondary | ICD-10-CM | POA: Diagnosis not present

## 2014-07-12 DIAGNOSIS — G8929 Other chronic pain: Secondary | ICD-10-CM | POA: Insufficient documentation

## 2014-07-12 DIAGNOSIS — R102 Pelvic and perineal pain: Secondary | ICD-10-CM

## 2014-07-12 DIAGNOSIS — R1031 Right lower quadrant pain: Secondary | ICD-10-CM | POA: Diagnosis present

## 2014-07-12 DIAGNOSIS — N83209 Unspecified ovarian cyst, unspecified side: Secondary | ICD-10-CM

## 2014-07-12 HISTORY — DX: Gastro-esophageal reflux disease without esophagitis: K21.9

## 2014-07-12 HISTORY — DX: Anxiety disorder, unspecified: F41.9

## 2014-07-12 HISTORY — DX: Anemia, unspecified: D64.9

## 2014-07-12 LAB — URINALYSIS, ROUTINE W REFLEX MICROSCOPIC
BILIRUBIN URINE: NEGATIVE
Glucose, UA: NEGATIVE mg/dL
Ketones, ur: NEGATIVE mg/dL
Leukocytes, UA: NEGATIVE
Nitrite: NEGATIVE
PH: 6.5 (ref 5.0–8.0)
Protein, ur: NEGATIVE mg/dL
Specific Gravity, Urine: 1.02 (ref 1.005–1.030)
Urobilinogen, UA: 1 mg/dL (ref 0.0–1.0)

## 2014-07-12 LAB — URINE MICROSCOPIC-ADD ON

## 2014-07-12 MED ORDER — PROMETHAZINE HCL 25 MG PO TABS
25.0000 mg | ORAL_TABLET | Freq: Once | ORAL | Status: AC
  Administered 2014-07-12: 25 mg via ORAL
  Filled 2014-07-12: qty 1

## 2014-07-12 MED ORDER — KETOROLAC TROMETHAMINE 60 MG/2ML IM SOLN
60.0000 mg | Freq: Once | INTRAMUSCULAR | Status: AC
  Administered 2014-07-12: 60 mg via INTRAMUSCULAR
  Filled 2014-07-12: qty 2

## 2014-07-12 NOTE — MAU Provider Note (Signed)
History     CSN: [id number]  Arrival date and time: 07/12/14 2218   First Provider Initiated Contact with Patient 07/12/14 2331      No chief complaint on file.  HPI This is a 28 y.o. female who presents with c/o worsening pain in RLQ and across lower abdomen. Was seen in [facility] ED 3 days ago and they told her she had a 4cm Right ovarian cyst. Also started bleeding today, a week or two early  For her period. States had a ruptured appendix a year ago with sepsis and required 9 days on a ventilator.  There are about 8 ED visits since 2014 for this same complaint. Toradol was offered/given at many of these visits, pt stating that "they usually have to give me Dilaudid" for the pain. She says the same tonight.   Denies nausea or vomiting. Denies diarrhea or constipation, though there are several mentions of chronic constipation and large stool burden seen on imaging in past visits. She had an appointment with Dr Roselie Awkward in clinic in July, but missed it. Has not rescheduled.   RN Note: Was told she had a hemorrhagic cyst on right ovary at [facility]. Having more pain today in pelvic region and she started bleeding.        OB History   Grav Para Term Preterm Abortions TAB SAB Ect Mult Living   2 2 2       2       Past Medical History  Diagnosis Date  . Ovarian cyst   . Peritonitis, acute generalized   . Sepsis   . Anxiety   . Anemia   . Gestational diabetes   . GERD (gastroesophageal reflux disease)     has resolved    Past Surgical History  Procedure Laterality Date  . Cesarean section    . Ovarian cyst drainage    . Appendectomy      ruptured     No family history on file.  History  Substance Use Topics  . Smoking status: Current Every Day Smoker -- 0.25 packs/day    Types: Cigarettes  . Smokeless tobacco: Never Used  . Alcohol Use: Yes     Comment: occassional    Allergies: No Known Allergies  Prescriptions prior to admission  Medication Sig Dispense Refill  .  acetaminophen (TYLENOL) 500 MG tablet Take 1,500 mg by mouth every 6 (six) hours as needed for moderate pain.      Marland Kitchen albuterol (PROVENTIL HFA;VENTOLIN HFA) 108 (90 BASE) MCG/ACT inhaler Inhale 1-2 puffs into the lungs every 6 (six) hours as needed for wheezing or shortness of breath.  1 Inhaler  0  . dicyclomine (BENTYL) 20 MG tablet Take 1 tablet (20 mg total) by mouth 4 (four) times daily -  before meals and at bedtime.  30 tablet  1  . docusate sodium (COLACE) 100 MG capsule Take 1 capsule (100 mg total) by mouth every 12 (twelve) hours.  30 capsule  0  . doxycycline (VIBRAMYCIN) 100 MG capsule Take 1 capsule (100 mg total) by mouth 2 (two) times daily.  20 capsule  0  . ibuprofen (ADVIL,MOTRIN) 800 MG tablet Take 1 tablet (800 mg total) by mouth every 8 (eight) hours as needed for mild pain.  30 tablet  0  . metroNIDAZOLE (FLAGYL) 500 MG tablet Take 1 tablet (500 mg total) by mouth 2 (two) times daily.  14 tablet  0  . naproxen (NAPROSYN) 500 MG tablet Take 1 tablet (500 mg  total) by mouth 2 (two) times daily with a meal. As needed for pain  20 tablet  0  . ondansetron (ZOFRAN) 4 MG tablet Take 1 tablet (4 mg total) by mouth every 8 (eight) hours as needed for nausea or vomiting.  20 tablet  0  . oxyCODONE-acetaminophen (PERCOCET/ROXICET) 5-325 MG per tablet Take 1 tablet by mouth every 4 (four) hours as needed.  15 tablet  0  . oxyCODONE-acetaminophen (PERCOCET/ROXICET) 5-325 MG per tablet Take 1-2 tablets by mouth every 6 (six) hours as needed.  20 tablet  0  . polyethylene glycol powder (GLYCOLAX/MIRALAX) powder Take 17 g by mouth 3 (three) times daily. Until daily soft stools  OTC  255 g  0  . promethazine (PHENERGAN) 25 MG tablet Take 1 tablet (25 mg total) by mouth every 6 (six) hours as needed for nausea or vomiting.  15 tablet  0    Review of Systems  Constitutional: Negative for fever, chills and malaise/fatigue.  Gastrointestinal: Positive for abdominal pain. Negative for nausea,  vomiting, diarrhea and constipation.  Genitourinary: Negative for dysuria.   Physical Exam   Blood pressure 125/77, pulse 82, temperature 98.8 F (37.1 C), temperature source Oral, resp. rate 18, height 5\' 4"  (1.626 m), weight 225 lb 6.4 oz (102.241 kg), last menstrual period 06/24/2014, SpO2 99.00%.  Physical Exam  Constitutional: She is oriented to person, place, and time. She appears well-developed and well-nourished. No distress.  HENT:  Head: Normocephalic.  Cardiovascular: Normal rate.   Respiratory: Effort normal.  GI: Soft. She exhibits no distension and no mass. There is tenderness (over lower abdomen). There is no rebound and no guarding.  Genitourinary: Vagina normal and uterus normal. No vaginal discharge found.  Cervix closed Uterus small and slightly tender Some CMT noted States RLQ is more tender than Left No exquisite tenderness observed  Musculoskeletal: Normal range of motion.  Neurological: She is alert and oriented to person, place, and time.  Skin: Skin is warm and dry.  Psychiatric: She has a normal mood and affect.    MAU Course  Procedures  MDM Results for orders placed during the hospital encounter of 07/12/14 (from the past 24 hour(s))  URINALYSIS, ROUTINE W REFLEX MICROSCOPIC     Status: Abnormal   Collection Time    07/12/14 10:31 PM      Result Value Ref Range   Color, Urine YELLOW  YELLOW   APPearance CLEAR  CLEAR   Specific Gravity, Urine 1.020  1.005 - 1.030   pH 6.5  5.0 - 8.0   Glucose, UA NEGATIVE  NEGATIVE mg/dL   Hgb urine dipstick LARGE (*) NEGATIVE   Bilirubin Urine NEGATIVE  NEGATIVE   Ketones, ur NEGATIVE  NEGATIVE mg/dL   Protein, ur NEGATIVE  NEGATIVE mg/dL   Urobilinogen, UA 1.0  0.0 - 1.0 mg/dL   Nitrite NEGATIVE  NEGATIVE   Leukocytes, UA NEGATIVE  NEGATIVE  URINE MICROSCOPIC-ADD ON     Status: Abnormal   Collection Time    07/12/14 10:31 PM      Result Value Ref Range   Squamous Epithelial / LPF RARE  RARE   WBC, UA  0-2  <3 WBC/hpf   RBC / HPF TOO NUMEROUS TO COUNT  <3 RBC/hpf   Bacteria, UA FEW (*) RARE    US Pelvis Complete  07/09/2014   CLINICAL DATA:  Pelvic pain, nausea  EXAM: TRANSABDOMINAL AND TRANSVAGINAL ULTRASOUND OF PELVIS  TECHNIQUE: Both transabdominal and transvaginal ultrasound examinations of the pelvis  were performed. Transabdominal technique was performed for global imaging of the pelvis including uterus, ovaries, adnexal regions, and pelvic cul-de-sac. It was necessary to proceed with endovaginal exam following the transabdominal exam to visualize the endometrium and ovaries.  COMPARISON:  CT abdomen pelvis of 07/09/2014, and ultrasound pelvis of 06/26/2014    FINDINGS: Uterus  Measurements: 9.9 x 4.5 x 5.8 cm. No myometrial abnormality is seen.                      Endometrium  Thickness: The endometrium is not optimally visualized, with the best measurement being 9 mm. No focal abnormality is seen.                       Right ovary  Measurements: 4.7 x 2.7 x 3.1 cm. Within the right ovary there is an inhomogeneous lesion of 3.8 x 3.7 x 2.3 cm.                      No significant blood flow is seen. Compared to the prior ultrasound this is at the location of a prior cyst, and this could represent a hemorrhagic cyst.                      Followup ultrasound in 2 months however is recommended to assess resolution.                       Left ovary  Measurements: 3.1 x 2.1 x 2.5 cm. Within the left ovary as well there is a 1.4 x 1.5 x 1.2 cm inhomogeneous structure at the site of a prior small cyst. This may also be hemorrhagic and again attention to this area on followup ultrasound in 2 months is recommended.                       Other findings  Only a trace amount of free fluid is noted in the cul-de-sac.    IMPRESSION: 1. Inhomogeneous "mass" within the right ovary is at the site of prior cyst and may represent a hemorrhagic cyst. Recommend followup ultrasound in 2 months to assess  resolution.                            2. Similar-appearing 1.5 cm inhomogeneous structure in the left ovary also may represent a small hemorrhagic cyst.                                        Again followup in 2 months is recommended.     Electronically Signed   By: Ivar Drape M.D.   On: 07/09/2014 16:34     Ct Abdomen Pelvis W Contrast  07/09/2014   CLINICAL DATA:  Pelvic pain.  EXAM: CT ABDOMEN AND PELVIS WITH CONTRAST  TECHNIQUE: Multidetector CT imaging of the abdomen and pelvis was performed using the standard protocol following bolus administration of intravenous contrast.  CONTRAST:  164mL OMNIPAQUE IOHEXOL 300 MG/ML  SOLN  COMPARISON:  Pelvic ultrasound performed 06/26/2014  FINDINGS: The visualized lung bases are clear.  The liver and spleen are unremarkable in appearance. The gallbladder is within normal limits. The pancreas and adrenal glands are unremarkable.  The kidneys are unremarkable in appearance. There is no evidence  of hydronephrosis. No renal or ureteral stones are seen. No perinephric stranding is appreciated.  No free fluid is identified. The small bowel is unremarkable in appearance. The stomach is within normal limits. No acute vascular abnormalities are seen.  The patient is status post appendectomy. The colon is unremarkable in appearance. An apparent clip is noted at the left lower quadrant, of uncertain significance.  Postoperative scarring is noted along the midline anterior abdominal wall.  The bladder is mildly distended and grossly unremarkable. The uterus is unremarkable in appearance. There is a 4.4 cm right adnexal cystic lesion, somewhat hazy in appearance. The left ovary is unremarkable in appearance. No inguinal lymphadenopathy is seen.  No acute osseous abnormalities are identified.    IMPRESSION: 4.4 cm right adnexal cystic lesion, somewhat hazy in appearance.                            Pelvic ultrasound would be helpful for further evaluation; as this is new  from the recent prior study,                                     and given the patient's history of hemorrhagic cyst, this may reflect a new hemorrhagic cyst.       [place] Art/ven Flow Abd Pelv Doppler  07/13/2014   CLINICAL DATA:  No neural 4 cm right ovarian cyst. Worsening pain, evaluate for torsion.  EXAM: TRANSVAGINAL ULTRASOUND OF PELVIS  DOPPLER ULTRASOUND OF OVARIES  TECHNIQUE: Transvaginal ultrasound examination of the pelvis was performed including evaluation of the uterus, ovaries, adnexal regions, and pelvic cul-de-sac.  Color and duplex Doppler ultrasound was utilized to evaluate blood flow to the ovaries.  COMPARISON:  07/09/2014    FINDINGS: Uterus  Measurements: 9 x 4 x 5 cm. No fibroids or other mass visualized.                      Endometrium  Poorly visualized margins.  No focal abnormality visualized.                      Right ovary  Measurements: 3.6 x 2.2 x 3.2 cm, asymmetrically enlarged by a re- demonstrated 2.6 x 1.4 x 2.4 cm masslike area of nonvascular mid level echoes. The neighboring parenchyma does not appear edematous.                      Left ovary  Measurements: 3.6 x 2.1 x 1.6 cm. Normal appearance/no adnexal mass.                      Pulsed Doppler evaluation demonstrates normal low-resistance arterial and venous waveforms in both ovaries.    IMPRESSION: 1. No evidence for ovarian torsion.                             2. Stable appearance of 2.6 cm lesion in the right ovary, a favored hemorrhagic cyst.                                            Short-interval follow up ultrasound in 6-12 weeks is recommended to document resolution,  preferably during the week following the patient's normal menses.     Electronically Signed   By: Jorje Guild M.D.   On: 07/13/2014 00:56    Assessment and Plan  A:  Pelvic pain, chronic      Right ovarian cyst, probably hemorrhagic, smaller than 3 days ago      No evidence of  torsion      History of ruptured appendix with scar tissue in abdomen  P:  Discussed reassuring findings with patient, who is concerned with being able to have another baby       Rx Percocet #10 only.      Just received Rx for #20 in ED 3 days ago.         Discussed this pain may not be gynecologic in origin.        Needs to reschedule with Dr Roselie Awkward in clinic for followup of chronic pain       Has been given Miralax and other meds for constipation   [facility] 07/12/2014, 11:36 PM

## 2014-07-12 NOTE — MAU Note (Signed)
Was told she had a hemorrhagic cyst on right ovary at [facility]. Having more pain today in pelvic region and she started bleeding.

## 2014-07-13 ENCOUNTER — Encounter (HOSPITAL_COMMUNITY): Payer: Self-pay | Admitting: Advanced Practice Midwife

## 2014-07-13 MED ORDER — PROMETHAZINE HCL 25 MG/ML IJ SOLN
12.5000 mg | Freq: Once | INTRAMUSCULAR | Status: AC
  Administered 2014-07-13: 12.5 mg via INTRAMUSCULAR
  Filled 2014-07-13: qty 1

## 2014-07-13 MED ORDER — OXYCODONE-ACETAMINOPHEN 5-325 MG PO TABS: 1.0000 | ORAL_TABLET | Freq: Four times a day (QID) | ORAL | Status: DC | PRN

## 2014-07-13 MED ORDER — HYDROMORPHONE HCL 1 MG/ML IJ SOLN
1.0000 mg | Freq: Once | INTRAMUSCULAR | Status: AC
Start: 2014-07-13 — End: 2014-07-13
  Administered 2014-07-13: 1 mg via INTRAMUSCULAR
  Filled 2014-07-13: qty 1

## 2014-07-13 NOTE — Discharge Instructions (Signed)
Abdominal Pain, Women °Abdominal (stomach, pelvic, or belly) pain can be caused by many things. It is important to tell your doctor: °· The location of the pain. °· Does it come and go or is it present all the time? °· Are there things that start the pain (eating certain foods, exercise)? °· Are there other symptoms associated with the pain (fever, nausea, vomiting, diarrhea)? °All of this is helpful to know when trying to find the cause of the pain. °CAUSES  °· Stomach: virus or bacteria infection, or ulcer. °· Intestine: appendicitis (inflamed appendix), regional ileitis (Crohn's disease), ulcerative colitis (inflamed colon), irritable bowel syndrome, diverticulitis (inflamed diverticulum of the colon), or cancer of the stomach or intestine. °· Gallbladder disease or stones in the gallbladder. °· Kidney disease, kidney stones, or infection. °· Pancreas infection or cancer. °· Fibromyalgia (pain disorder). °· Diseases of the female organs: °¨ Uterus: fibroid (non-cancerous) tumors or infection. °¨ Fallopian tubes: infection or tubal pregnancy. °¨ Ovary: cysts or tumors. °¨ Pelvic adhesions (scar tissue). °¨ Endometriosis (uterus lining tissue growing in the pelvis and on the pelvic organs). °¨ Pelvic congestion syndrome (female organs filling up with blood just before the menstrual period). °¨ Pain with the menstrual period. °¨ Pain with ovulation (producing an egg). °¨ Pain with an IUD (intrauterine device, birth control) in the uterus. °¨ Cancer of the female organs. °· Functional pain (pain not caused by a disease, may improve without treatment). °· Psychological pain. °· Depression. °DIAGNOSIS  °Your doctor will decide the seriousness of your pain by doing an examination. °· Blood tests. °· X-rays. °· Ultrasound. °· CT scan (computed tomography, special type of X-ray). °· MRI (magnetic resonance imaging). °· Cultures, for infection. °· Barium enema (dye inserted in the large intestine, to better view it with  X-rays). °· Colonoscopy (looking in intestine with a lighted tube). °· Laparoscopy (minor surgery, looking in abdomen with a lighted tube). °· Major abdominal exploratory surgery (looking in abdomen with a large incision). °TREATMENT  °The treatment will depend on the cause of the pain.  °· Many cases can be observed and treated at home. °· Over-the-counter medicines recommended by your caregiver. °· Prescription medicine. °· Antibiotics, for infection. °· Birth control pills, for painful periods or for ovulation pain. °· Hormone treatment, for endometriosis. °· Nerve blocking injections. °· Physical therapy. °· Antidepressants. °· Counseling with a psychologist or psychiatrist. °· Minor or major surgery. °HOME CARE INSTRUCTIONS  °· Do not take laxatives, unless directed by your caregiver. °· Take over-the-counter pain medicine only if ordered by your caregiver. Do not take aspirin because it can cause an upset stomach or bleeding. °· Try a clear liquid diet (broth or water) as ordered by your caregiver. Slowly move to a bland diet, as tolerated, if the pain is related to the stomach or intestine. °· Have a thermometer and take your temperature several times a day, and record it. °· Bed rest and sleep, if it helps the pain. °· Avoid sexual intercourse, if it causes pain. °· Avoid stressful situations. °· Keep your follow-up appointments and tests, as your caregiver orders. °· If the pain does not go away with medicine or surgery, you may try: °¨ Acupuncture. °¨ Relaxation exercises (yoga, meditation). °¨ Group therapy. °¨ Counseling. °SEEK MEDICAL CARE IF:  °· You notice certain foods cause stomach pain. °· Your home care treatment is not helping your pain. °· You need stronger pain medicine. °· You want your IUD removed. °· You feel faint or   lightheaded. °· You develop nausea and vomiting. °· You develop a rash. °· You are having side effects or an allergy to your medicine. °SEEK IMMEDIATE MEDICAL CARE IF:  °· Your  pain does not go away or gets worse. °· You have a fever. °· Your pain is felt only in portions of the abdomen. The right side could possibly be appendicitis. The left lower portion of the abdomen could be colitis or diverticulitis. °· You are passing blood in your stools (bright red or black tarry stools, with or without vomiting). °· You have blood in your urine. °· You develop chills, with or without a fever. °· You pass out. °MAKE SURE YOU:  °· Understand these instructions. °· Will watch your condition. °· Will get help right away if you are not doing well or get worse. °Document Released: 08/06/2007 Document Revised: 02/23/2014 Document Reviewed: 08/26/2009 °[employer]® Patient Information ©2015 [employer]. This information is not intended to replace advice given to you by your health care provider. Make sure you discuss any questions you have with your health care provider. ° °

## 2014-07-13 NOTE — Progress Notes (Signed)
No s/s of adverse reaction or sensitivity to medications given noted. Patient states pain has decreased and rates it 7of10. Pt to dc home with friend who is driving.

## 2014-07-13 NOTE — Progress Notes (Signed)
Pt states that pain medication never helped and pain is now worse since the U/S

## 2014-07-13 NOTE — MAU Provider Note (Signed)
Attestation of Attending Supervision of Advanced Practitioner (CNM/NP): Evaluation and management procedures were performed by the Advanced Practitioner under my supervision and collaboration.  I have reviewed the Advanced Practitioner's note and chart, and I agree with the management and plan.  Frank Novelo 07/13/2014 8:09 AM

## 2014-07-28 ENCOUNTER — Emergency Department (HOSPITAL_COMMUNITY): Payer: Medicaid Other

## 2014-07-28 ENCOUNTER — Emergency Department (HOSPITAL_COMMUNITY)
Admission: EM | Admit: 2014-07-28 | Discharge: 2014-07-28 | Disposition: A | Discharge status: Home / Self Care | Payer: Medicaid Other | Attending: Emergency Medicine | Admitting: Emergency Medicine

## 2014-07-28 ENCOUNTER — Encounter (HOSPITAL_COMMUNITY): Payer: Self-pay | Admitting: Emergency Medicine

## 2014-07-28 DIAGNOSIS — Z791 Long term (current) use of non-steroidal anti-inflammatories (NSAID): Secondary | ICD-10-CM | POA: Insufficient documentation

## 2014-07-28 DIAGNOSIS — Z8619 Personal history of other infectious and parasitic diseases: Secondary | ICD-10-CM | POA: Diagnosis not present

## 2014-07-28 DIAGNOSIS — R1084 Generalized abdominal pain: Secondary | ICD-10-CM | POA: Diagnosis not present

## 2014-07-28 DIAGNOSIS — Z79899 Other long term (current) drug therapy: Secondary | ICD-10-CM | POA: Insufficient documentation

## 2014-07-28 DIAGNOSIS — Z8659 Personal history of other mental and behavioral disorders: Secondary | ICD-10-CM | POA: Diagnosis not present

## 2014-07-28 DIAGNOSIS — Z792 Long term (current) use of antibiotics: Secondary | ICD-10-CM | POA: Diagnosis not present

## 2014-07-28 DIAGNOSIS — Z720 Tobacco use: Secondary | ICD-10-CM | POA: Diagnosis not present

## 2014-07-28 DIAGNOSIS — R112 Nausea with vomiting, unspecified: Secondary | ICD-10-CM | POA: Diagnosis not present

## 2014-07-28 DIAGNOSIS — R109 Unspecified abdominal pain: Secondary | ICD-10-CM

## 2014-07-28 DIAGNOSIS — Z8632 Personal history of gestational diabetes: Secondary | ICD-10-CM | POA: Diagnosis not present

## 2014-07-28 DIAGNOSIS — K5900 Constipation, unspecified: Secondary | ICD-10-CM | POA: Diagnosis not present

## 2014-07-28 DIAGNOSIS — N390 Urinary tract infection, site not specified: Secondary | ICD-10-CM | POA: Insufficient documentation

## 2014-07-28 DIAGNOSIS — Z9089 Acquired absence of other organs: Secondary | ICD-10-CM | POA: Insufficient documentation

## 2014-07-28 DIAGNOSIS — Z3202 Encounter for pregnancy test, result negative: Secondary | ICD-10-CM | POA: Diagnosis not present

## 2014-07-28 DIAGNOSIS — G8929 Other chronic pain: Secondary | ICD-10-CM | POA: Diagnosis not present

## 2014-07-28 DIAGNOSIS — Z862 Personal history of diseases of the blood and blood-forming organs and certain disorders involving the immune mechanism: Secondary | ICD-10-CM | POA: Insufficient documentation

## 2014-07-28 LAB — CBC WITH DIFFERENTIAL/PLATELET
Basophils Absolute: 0 10*3/uL (ref 0.0–0.1)
Basophils Relative: 0 % (ref 0–1)
Eosinophils Absolute: 0.1 10*3/uL (ref 0.0–0.7)
Eosinophils Relative: 1 % (ref 0–5)
HCT: 35.6 % — ABNORMAL LOW (ref 36.0–46.0)
Hemoglobin: 11.4 g/dL — ABNORMAL LOW (ref 12.0–15.0)
Lymphocytes Relative: 31 % (ref 12–46)
Lymphs Abs: 1.5 10*3/uL (ref 0.7–4.0)
MCH: 25.7 pg — ABNORMAL LOW (ref 26.0–34.0)
MCHC: 32 g/dL (ref 30.0–36.0)
MCV: 80.2 fL (ref 78.0–100.0)
Monocytes Absolute: 0.3 10*3/uL (ref 0.1–1.0)
Monocytes Relative: 7 % (ref 3–12)
Neutro Abs: 2.9 10*3/uL (ref 1.7–7.7)
Neutrophils Relative %: 61 % (ref 43–77)
Platelets: 240 10*3/uL (ref 150–400)
RBC: 4.44 MIL/uL (ref 3.87–5.11)
RDW: 13.8 % (ref 11.5–15.5)
WBC: 4.8 10*3/uL (ref 4.0–10.5)

## 2014-07-28 LAB — I-STAT TROPONIN, ED: Troponin i, poc: 0 ng/mL (ref 0.00–0.08)

## 2014-07-28 LAB — URINALYSIS, ROUTINE W REFLEX MICROSCOPIC
Bilirubin Urine: NEGATIVE
Glucose, UA: NEGATIVE mg/dL
Hgb urine dipstick: NEGATIVE
Ketones, ur: NEGATIVE mg/dL
Nitrite: NEGATIVE
Protein, ur: NEGATIVE mg/dL
Specific Gravity, Urine: 1.027 (ref 1.005–1.030)
Urobilinogen, UA: 1 mg/dL (ref 0.0–1.0)
pH: 5.5 (ref 5.0–8.0)

## 2014-07-28 LAB — COMPREHENSIVE METABOLIC PANEL
ALT: 12 U/L (ref 0–35)
AST: 17 U/L (ref 0–37)
Albumin: 3.5 g/dL (ref 3.5–5.2)
Alkaline Phosphatase: 61 U/L (ref 39–117)
Anion gap: 11 (ref 5–15)
BUN: 10 mg/dL (ref 6–23)
CO2: 24 mEq/L (ref 19–32)
Calcium: 8.8 mg/dL (ref 8.4–10.5)
Chloride: 104 mEq/L (ref 96–112)
Creatinine, Ser: 0.72 mg/dL (ref 0.50–1.10)
GFR calc Af Amer: >90 mL/min (ref >90)
GFR calc non Af Amer: >90 mL/min (ref >90)
Glucose, Bld: 101 mg/dL — ABNORMAL HIGH (ref 70–99)
Potassium: 3.6 mEq/L — ABNORMAL LOW (ref 3.7–5.3)
Sodium: 139 mEq/L (ref 137–147)
Total Bilirubin: 0.3 mg/dL (ref 0.3–1.2)
Total Protein: 7.3 g/dL (ref 6.0–8.3)

## 2014-07-28 LAB — URINE MICROSCOPIC-ADD ON

## 2014-07-28 LAB — POC URINE PREG, ED: Preg Test, Ur: NEGATIVE

## 2014-07-28 LAB — LIPASE, BLOOD: Lipase: 25 U/L (ref 11–59)

## 2014-07-28 MED ORDER — SODIUM CHLORIDE 0.9 % IV SOLN
1000.0000 mL | Freq: Once | INTRAVENOUS | Status: AC
  Administered 2014-07-28: 1000 mL via INTRAVENOUS

## 2014-07-28 MED ORDER — CEPHALEXIN 500 MG PO CAPS: 500.0000 mg | ORAL_CAPSULE | Freq: Four times a day (QID) | ORAL | Status: DC

## 2014-07-28 MED ORDER — ONDANSETRON HCL 4 MG PO TABS: 4.0000 mg | ORAL_TABLET | Freq: Four times a day (QID) | ORAL | Status: DC

## 2014-07-28 MED ORDER — ONDANSETRON HCL 4 MG/2ML IJ SOLN
4.0000 mg | Freq: Once | INTRAMUSCULAR | Status: AC
  Administered 2014-07-28: 4 mg via INTRAVENOUS
  Filled 2014-07-28: qty 2

## 2014-07-28 MED ORDER — HYDROMORPHONE HCL 1 MG/ML IJ SOLN
0.5000 mg | Freq: Once | INTRAMUSCULAR | Status: AC
  Administered 2014-07-28: 0.5 mg via INTRAVENOUS
  Filled 2014-07-28: qty 1

## 2014-07-28 NOTE — ED Provider Notes (Signed)
CSN: [id number]     Arrival date & time 07/28/14  0449 History   First MD Initiated Contact with Patient 07/28/14 [phone]     Chief Complaint  Patient presents with  . Abdominal Pain     (Consider location/radiation/quality/duration/timing/severity/associated sxs/prior Treatment) Patient is a 28 y.o. female presenting with abdominal pain. The history is provided by the patient. No language interpreter was used.  Abdominal Pain Pain location:  Generalized Pain quality: aching   Pain radiates to:  Back Pain severity:  Moderate Onset quality:  Gradual Timing:  Constant Progression:  Worsening Associated symptoms: constipation, nausea and vomiting   Associated symptoms: no chills, no dysuria, no fever and no vaginal discharge   Associated symptoms comment:  Generalized abdominal pain x 5 days with decreased bowel movements. She reports nausea and vomiting without known fever. No urinary symptoms, vaginal discharge or bloody emesis. No cough or chest pain.   Past Medical History  Diagnosis Date  . Ovarian cyst   . Peritonitis, acute generalized   . Sepsis   . Anxiety   . Anemia   . Gestational diabetes   . GERD (gastroesophageal reflux disease)     has resolved   Past Surgical History  Procedure Laterality Date  . Cesarean section    . Ovarian cyst drainage    . Appendectomy      ruptured    No family history on file. History  Substance Use Topics  . Smoking status: Current Every Day Smoker -- 0.25 packs/day    Types: Cigarettes  . Smokeless tobacco: Never Used  . Alcohol Use: Yes     Comment: occassional   OB History   Grav Para Term Preterm Abortions TAB SAB Ect Mult Living   2 2 2       2      Review of Systems  Constitutional: Negative for fever and chills.  HENT: Negative.   Respiratory: Negative.   Cardiovascular: Negative.   Gastrointestinal: Positive for nausea, vomiting, abdominal pain and constipation.  Genitourinary: Negative.  Negative for dysuria and  vaginal discharge.  Musculoskeletal: Negative.   Skin: Negative.   Neurological: Negative.  Negative for light-headedness and headaches.      Allergies  Review of patient's allergies indicates no known allergies.  Home Medications   Prior to Admission medications   Medication Sig Start Date End Date Taking? Authorizing Provider  acetaminophen (TYLENOL) 500 MG tablet Take 1,500 mg by mouth every 6 (six) hours as needed for moderate pain.    Historical Provider, MD  albuterol (PROVENTIL HFA;VENTOLIN HFA) 108 (90 BASE) MCG/ACT inhaler Inhale 1-2 puffs into the lungs every 6 (six) hours as needed for wheezing or shortness of breath. 11/17/13   Ruthell Rummage Dammen, PA-C  dicyclomine (BENTYL) 20 MG tablet Take 1 tablet (20 mg total) by mouth 4 (four) times daily -  before meals and at bedtime. 10/18/13 10/18/14  Manya Silvas, CNM  docusate sodium (COLACE) 100 MG capsule Take 1 capsule (100 mg total) by mouth every 12 (twelve) hours. 01/07/14   Antonietta Breach, PA-C  doxycycline (VIBRAMYCIN) 100 MG capsule Take 1 capsule (100 mg total) by mouth 2 (two) times daily. 07/09/14   Dorie Rank, MD  ibuprofen (ADVIL,MOTRIN) 800 MG tablet Take 1 tablet (800 mg total) by mouth every 8 (eight) hours as needed for mild pain. 04/16/14   Kristen N Ward, DO  metroNIDAZOLE (FLAGYL) 500 MG tablet Take 1 tablet (500 mg total) by mouth 2 (two) times daily. 04/16/14  Kristen N Ward, DO  naproxen (NAPROSYN) 500 MG tablet Take 1 tablet (500 mg total) by mouth 2 (two) times daily with a meal. As needed for pain 07/09/14   Dorie Rank, MD  ondansetron (ZOFRAN) 4 MG tablet Take 1 tablet (4 mg total) by mouth every 8 (eight) hours as needed for nausea or vomiting. 01/07/14   Antonietta Breach, PA-C  oxyCODONE-acetaminophen (PERCOCET/ROXICET) 5-325 MG per tablet Take 1-2 tablets by mouth every 6 (six) hours as needed. 07/09/14   Dorie Rank, MD  oxyCODONE-acetaminophen (PERCOCET/ROXICET) 5-325 MG per tablet Take 1-2 tablets by mouth every 6 (six)  hours as needed. 07/13/14   Seabron Spates, CNM  polyethylene glycol powder (GLYCOLAX/MIRALAX) powder Take 17 g by mouth 3 (three) times daily. Until daily soft stools  OTC 01/07/14   Antonietta Breach, PA-C  promethazine (PHENERGAN) 25 MG tablet Take 1 tablet (25 mg total) by mouth every 6 (six) hours as needed for nausea or vomiting. 04/16/14   Kristen N Ward, DO   BP 115/54  Pulse 75  Temp(Src) 98.3 F (36.8 C) (Oral)  Resp 21  Ht 5\' 7"  (1.702 m)  Wt 210 lb (95.255 kg)  BMI 32.88 kg/m2  SpO2 100%  LMP 06/24/2014 Physical Exam  Constitutional: She appears well-developed and well-nourished.  HENT:  Head: Normocephalic.  Neck: Normal range of motion. Neck supple.  Cardiovascular: Normal rate and regular rhythm.   Pulmonary/Chest: Effort normal and breath sounds normal.  Abdominal: Soft. Bowel sounds are normal. There is tenderness. There is no rebound and no guarding.  Well healed midline abdominal surgical scar present. Soft abdomen, generally tender. Bowels sounds are positive throughout abdomen.  Musculoskeletal: Normal range of motion.  Neurological: She is alert. No cranial nerve deficit.  Skin: Skin is warm and dry. No rash noted.  Psychiatric: She has a normal mood and affect.    ED Course  Procedures (including critical care time) Labs Review Labs Reviewed  CBC WITH DIFFERENTIAL - Abnormal; Notable for the following:    Hemoglobin 11.4 (*)    HCT 35.6 (*)    MCH 25.7 (*)    All other components within normal limits  COMPREHENSIVE METABOLIC PANEL - Abnormal; Notable for the following:    Potassium 3.6 (*)    Glucose, Bld 101 (*)    All other components within normal limits  LIPASE, BLOOD  URINALYSIS, ROUTINE W REFLEX MICROSCOPIC  I-STAT TROPOININ, ED  POC URINE PREG, ED   Results for orders placed during the hospital encounter of 07/28/14  CBC WITH DIFFERENTIAL      Result Value Ref Range   WBC 4.8  4.0 - 10.5 K/uL   RBC 4.44  3.87 - 5.11 MIL/uL   Hemoglobin 11.4  (*) 12.0 - 15.0 g/dL   HCT 35.6 (*) 36.0 - 46.0 %   MCV 80.2  78.0 - 100.0 fL   MCH 25.7 (*) 26.0 - 34.0 pg   MCHC 32.0  30.0 - 36.0 g/dL   RDW 13.8  11.5 - 15.5 %   Platelets 240  150 - 400 K/uL   Neutrophils Relative % 61  43 - 77 %   Neutro Abs 2.9  1.7 - 7.7 K/uL   Lymphocytes Relative 31  12 - 46 %   Lymphs Abs 1.5  0.7 - 4.0 K/uL   Monocytes Relative 7  3 - 12 %   Monocytes Absolute 0.3  0.1 - 1.0 K/uL   Eosinophils Relative 1  0 - 5 %  Eosinophils Absolute 0.1  0.0 - 0.7 K/uL   Basophils Relative 0  0 - 1 %   Basophils Absolute 0.0  0.0 - 0.1 K/uL  COMPREHENSIVE METABOLIC PANEL      Result Value Ref Range   Sodium 139  137 - 147 mEq/L   Potassium 3.6 (*) 3.7 - 5.3 mEq/L   Chloride 104  96 - 112 mEq/L   CO2 24  19 - 32 mEq/L   Glucose, Bld 101 (*) 70 - 99 mg/dL   BUN 10  6 - 23 mg/dL   Creatinine, Ser 0.72  0.50 - 1.10 mg/dL   Calcium 8.8  8.4 - 10.5 mg/dL   Total Protein 7.3  6.0 - 8.3 g/dL   Albumin 3.5  3.5 - 5.2 g/dL   AST 17  0 - 37 U/L   ALT 12  0 - 35 U/L   Alkaline Phosphatase 61  39 - 117 U/L   Total Bilirubin 0.3  0.3 - 1.2 mg/dL   GFR calc non Af Amer >90  >90 mL/min   GFR calc Af Amer >90  >90 mL/min   Anion gap 11  5 - 15  LIPASE, BLOOD      Result Value Ref Range   Lipase 25  11 - 59 U/L  URINALYSIS, ROUTINE W REFLEX MICROSCOPIC      Result Value Ref Range   Color, Urine YELLOW  YELLOW   APPearance CLOUDY (*) CLEAR   Specific Gravity, Urine 1.027  1.005 - 1.030   pH 5.5  5.0 - 8.0   Glucose, UA NEGATIVE  NEGATIVE mg/dL   Hgb urine dipstick NEGATIVE  NEGATIVE   Bilirubin Urine NEGATIVE  NEGATIVE   Ketones, ur NEGATIVE  NEGATIVE mg/dL   Protein, ur NEGATIVE  NEGATIVE mg/dL   Urobilinogen, UA 1.0  0.0 - 1.0 mg/dL   Nitrite NEGATIVE  NEGATIVE   Leukocytes, UA SMALL (*) NEGATIVE  URINE MICROSCOPIC-ADD ON      Result Value Ref Range   Squamous Epithelial / LPF MANY (*) RARE   WBC, UA 7-10  <3 WBC/hpf   RBC / HPF 0-2  <3 RBC/hpf   Bacteria, UA  MANY (*) RARE   Urine-Other MUCOUS PRESENT    I-STAT TROPOININ, ED      Result Value Ref Range   Troponin i, poc 0.00  0.00 - 0.08 ng/mL   Comment 3           POC URINE PREG, ED      Result Value Ref Range   Preg Test, Ur NEGATIVE  NEGATIVE      Dg Abd Acute W/chest  07/28/2014   CLINICAL DATA:  Acute onset of mid abdominal pain. Nausea, vomiting and diarrhea for 4 days. Initial encounter.  EXAM: ACUTE ABDOMEN SERIES (ABDOMEN 2 VIEW & CHEST 1 VIEW)  COMPARISON:  Abdominal and chest radiographs performed 04/16/2014  FINDINGS: The lungs are well-aerated and clear. There is no evidence of focal opacification, pleural effusion or pneumothorax. The cardiomediastinal silhouette is within normal limits.  The visualized bowel gas pattern is unremarkable. Scattered stool and air are seen within the colon; there is no evidence of small bowel dilatation to suggest obstruction. No free intra-abdominal air is identified on the provided upright view.  No acute osseous abnormalities are seen; the sacroiliac joints are unremarkable in appearance.  IMPRESSION: 1. Unremarkable bowel gas pattern; no free intra-abdominal air seen. Moderate amount of stool noted in the colon. 2. No acute cardiopulmonary process  seen.   Electronically Signed   By: Garald Balding M.D.   On: 07/28/2014 06:53    Imaging Review No results found.   EKG Interpretation None      MDM   Final diagnoses:  None    1. UTI 2. Chronic abdominal pain  VSS. There is evidence of UTI on lab studies, and constipation. Without fever, leukocytosis, doubt obstruction, sepsis. Abx, PCP follow up. Return precautions discussed.  Dewaine Oats, PA-C 07/28/14 [phone]

## 2014-07-28 NOTE — ED Notes (Signed)
Pt coming from work with c/o of generalized abdominal pain x 2 days.  Pt states the pain got worse tonight with nausea, vomiting x 3 and being unable to have a bowel movement since this past Friday.  Pain is 9/10.

## 2014-07-28 NOTE — ED Notes (Signed)
Returned from [employer], obtained urine sample, given pain meds and placed back on monitor.

## 2014-07-28 NOTE — ED Provider Notes (Signed)
Medical screening examination/treatment/procedure(s) were performed by non-physician practitioner and as supervising physician I was immediately available for consultation/collaboration.   EKG Interpretation None        Everlene Balls, MD 07/28/14 1850

## 2014-07-28 NOTE — Discharge Instructions (Signed)

## 2014-07-28 NOTE — ED Notes (Signed)
Patient transported to X-ray 

## 2014-08-03 ENCOUNTER — Ambulatory Visit (INDEPENDENT_AMBULATORY_CARE_PROVIDER_SITE_OTHER): Payer: Medicaid Other | Admitting: Obstetrics & Gynecology

## 2014-08-03 ENCOUNTER — Encounter: Payer: Self-pay | Admitting: Obstetrics & Gynecology

## 2014-08-03 VITALS — BP 116/77 | HR 90 | Ht 66.0 in | Wt 222.1 lb

## 2014-08-03 DIAGNOSIS — N8320 Unspecified ovarian cysts: Secondary | ICD-10-CM

## 2014-08-03 DIAGNOSIS — N83202 Unspecified ovarian cyst, left side: Principal | ICD-10-CM

## 2014-08-03 DIAGNOSIS — N83201 Unspecified ovarian cyst, right side: Secondary | ICD-10-CM

## 2014-08-03 MED ORDER — NORGESTIMATE-ETH ESTRADIOL 0.25-35 MG-MCG PO TABS: 1.0000 | ORAL_TABLET | Freq: Every day | ORAL | Status: DC

## 2014-08-03 MED ORDER — TRAMADOL HCL 50 MG PO TABS: 50.0000 mg | ORAL_TABLET | Freq: Four times a day (QID) | ORAL | Status: DC | PRN

## 2014-08-03 NOTE — Progress Notes (Addendum)
Subjective:     Patient ID: Jodi Mcgee, female   DOB: [birth year], 28 y.o.   MRN: [id number]  HPI Pt reports revolving pain in the adnexa and pt is here for f/u.  Pt reports that she had a ruptured appy and since that time she has had intermittent pain in her adnexa.  She reports that she had one drained prev.   Past Medical History  Diagnosis Date  . Ovarian cyst   . Peritonitis, acute generalized   . Sepsis   . Anxiety   . Anemia   . Gestational diabetes   . GERD (gastroesophageal reflux disease)     has resolved   Past Surgical History  Procedure Laterality Date  . Cesarean section    . Ovarian cyst drainage    . Appendectomy      ruptured    Current Outpatient Prescriptions on File Prior to Visit  Medication Sig Dispense Refill  . dicyclomine (BENTYL) 20 MG tablet Take 1 tablet (20 mg total) by mouth 4 (four) times daily -  before meals and at bedtime.  30 tablet  1  . polyethylene glycol powder (GLYCOLAX/MIRALAX) powder Take 17 g by mouth 3 (three) times daily. Until daily soft stools  OTC  255 g  0  . albuterol (PROVENTIL HFA;VENTOLIN HFA) 108 (90 BASE) MCG/ACT inhaler Inhale 1-2 puffs into the lungs every 6 (six) hours as needed for wheezing or shortness of breath.  1 Inhaler  0  . cephALEXin (KEFLEX) 500 MG capsule Take 1 capsule (500 mg total) by mouth 4 (four) times daily.  20 capsule  0  . ondansetron (ZOFRAN) 4 MG tablet Take 1 tablet (4 mg total) by mouth every 6 (six) hours.  12 tablet  0  . oxyCODONE-acetaminophen (PERCOCET/ROXICET) 5-325 MG per tablet Take 1-2 tablets by mouth every 6 (six) hours as needed for moderate pain or severe pain.       No current facility-administered medications on file prior to visit.  No Known Allergies   Review of Systems     Objective:   Physical Exam BP 116/77  Pulse 90  Ht 5\' 6"  (1.676 m)  Wt 222 lb 1.6 oz (100.744 kg)  BMI 35.87 kg/m2  LMP 06/24/2014  07/09/2014 EXAM:  TRANSABDOMINAL AND TRANSVAGINAL  ULTRASOUND OF PELVIS  TECHNIQUE:  Both transabdominal and transvaginal ultrasound examinations of the  pelvis were performed. Transabdominal technique was performed for  global imaging of the pelvis including uterus, ovaries, adnexal  regions, and pelvic cul-de-sac. It was necessary to proceed with  endovaginal exam following the transabdominal exam to visualize the  endometrium and ovaries.  COMPARISON: CT abdomen pelvis of 07/09/2014, and ultrasound pelvis  of 06/26/2014  FINDINGS:  Uterus  Measurements: 9.9 x 4.5 x 5.8 cm. No myometrial abnormality is  seen.  Endometrium  Thickness: The endometrium is not optimally visualized, with the  best measurement being 9 mm. No focal abnormality is seen.  Right ovary  Measurements: 4.7 x 2.7 x 3.1 cm. Within the right ovary there is  an inhomogeneous lesion of 3.8 x 3.7 x 2.3 cm. No significant blood  flow is seen. Compared to the prior ultrasound this is at the  location of a prior cyst, and this could represent a hemorrhagic  cyst. Followup ultrasound in 2 months however is recommended to  assess resolution.  Left ovary  Measurements: 3.1 x 2.1 x 2.5 cm. Within the left ovary as well  there is a 1.4 x 1.5 x  1.2 cm inhomogeneous structure at the site of  a prior small cyst. This may also be hemorrhagic and again attention  to this area on followup ultrasound in 2 months is recommended.  Other findings  Only a trace amount of free fluid is noted in the cul-de-sac.  IMPRESSION:  1. Inhomogeneous "mass" within the right ovary is at the site of  prior cyst and may represent a hemorrhagic cyst. Recommend followup  ultrasound in 2 months to assess resolution.  2. Similar-appearing 1.5 cm inhomogeneous structure in the left  ovary also may represent a small hemorrhagic cyst. Again followup in  2 months is recommended.   07/09/2014 EXAM:  CT ABDOMEN AND PELVIS WITH CONTRAST  TECHNIQUE:  Multidetector CT imaging of the abdomen and pelvis  was performed  using the standard protocol following bolus administration of  intravenous contrast.  CONTRAST: 161mL OMNIPAQUE IOHEXOL 300 MG/ML SOLN  COMPARISON: Pelvic ultrasound performed 06/26/2014  FINDINGS:  The visualized lung bases are clear.  The liver and spleen are unremarkable in appearance. The gallbladder  is within normal limits. The pancreas and adrenal glands are  unremarkable.  The kidneys are unremarkable in appearance. There is no evidence of  hydronephrosis. No renal or ureteral stones are seen. No perinephric  stranding is appreciated.  No free fluid is identified. The small bowel is unremarkable in  appearance. The stomach is within normal limits. No acute vascular  abnormalities are seen.  The patient is status post appendectomy. The colon is unremarkable  in appearance. An apparent clip is noted at the left lower quadrant,  of uncertain significance.  Postoperative scarring is noted along the midline anterior abdominal  wall.  The bladder is mildly distended and grossly unremarkable. The uterus  is unremarkable in appearance. There is a 4.4 cm right adnexal  cystic lesion, somewhat hazy in appearance. The left ovary is  unremarkable in appearance. No inguinal lymphadenopathy is seen.  No acute osseous abnormalities are identified.  IMPRESSION:  4.4 cm right adnexal cystic lesion, somewhat hazy in appearance.  Pelvic ultrasound would be helpful for further evaluation; as this  is new from the recent prior study, and given the patient's history  of hemorrhagic cyst, this may reflect a new hemorrhagic cyst.    07/13/2014 CLINICAL DATA: No neural 4 cm right ovarian cyst. Worsening pain,  evaluate for torsion.  EXAM:  TRANSVAGINAL ULTRASOUND OF PELVIS  DOPPLER ULTRASOUND OF OVARIES  TECHNIQUE:  Transvaginal ultrasound examination of the pelvis was performed  including evaluation of the uterus, ovaries, adnexal regions, and  pelvic cul-de-sac.  Color and  duplex Doppler ultrasound was utilized to evaluate blood  flow to the ovaries.  COMPARISON: 07/09/2014  FINDINGS:  Uterus  Measurements: 9 x 4 x 5 cm. No fibroids or other mass visualized.  Endometrium  Poorly visualized margins. No focal abnormality visualized.  Right ovary  Measurements: 3.6 x 2.2 x 3.2 cm, asymmetrically enlarged by a re-  demonstrated 2.6 x 1.4 x 2.4 cm masslike area of nonvascular mid  level echoes. The neighboring parenchyma does not appear edematous.  Left ovary  Measurements: 3.6 x 2.1 x 1.6 cm. Normal appearance/no adnexal mass.  Pulsed Doppler evaluation demonstrates normal low-resistance  arterial and venous waveforms in both ovaries.  IMPRESSION:  1. No evidence for ovarian torsion.  2. Stable appearance of 2.6 cm lesion in the right ovary, a favored  hemorrhagic cyst. Short-interval follow up ultrasound in 6-12 weeks  is recommended to document resolution, preferably during the  week  following the patient's normal menses.       Assessment:     Ovarian cysts that resolve and return will attempt to place on OCP's to stop development of new cyst .  Pt with a h/o adnexal abscess after ruptured appy.     Plan:     Sprintec 1 po q day F/u in 3 months or sooner prn

## 2014-08-03 NOTE — Patient Instructions (Signed)
Ovarian Cyst An ovarian cyst is a fluid-filled sac that forms on an ovary. The ovaries are small organs that produce eggs in women. Various types of cysts can form on the ovaries. Most are not cancerous. Many do not cause problems, and they often go away on their own. Some may cause symptoms and require treatment. Common types of ovarian cysts include:  Functional cysts--These cysts may occur every month during the menstrual cycle. This is normal. The cysts usually go away with the next menstrual cycle if the woman does not get pregnant. Usually, there are no symptoms with a functional cyst.  Endometrioma cysts--These cysts form from the tissue that lines the uterus. They are also called "chocolate cysts" because they become filled with blood that turns brown. This type of cyst can cause pain in the lower abdomen during intercourse and with your menstrual period.  Cystadenoma cysts--This type develops from the cells on the outside of the ovary. These cysts can get very big and cause lower abdomen pain and pain with intercourse. This type of cyst can twist on itself, cut off its blood supply, and cause severe pain. It can also easily rupture and cause a lot of pain.  Dermoid cysts--This type of cyst is sometimes found in both ovaries. These cysts may contain different kinds of body tissue, such as skin, teeth, hair, or cartilage. They usually do not cause symptoms unless they get very big.  Theca lutein cysts--These cysts occur when too much of a certain hormone (human chorionic gonadotropin) is produced and overstimulates the ovaries to produce an egg. This is most common after procedures used to assist with the conception of a baby (in vitro fertilization). CAUSES   Fertility drugs can cause a condition in which multiple large cysts are formed on the ovaries. This is called ovarian hyperstimulation syndrome.  A condition called polycystic ovary syndrome can cause hormonal imbalances that can lead to  nonfunctional ovarian cysts. SIGNS AND SYMPTOMS  Many ovarian cysts do not cause symptoms. If symptoms are present, they may include:  Pelvic pain or pressure.  Pain in the lower abdomen.  Pain during sexual intercourse.  Increasing girth (swelling) of the abdomen.  Abnormal menstrual periods.  Increasing pain with menstrual periods.  Stopping having menstrual periods without being pregnant. DIAGNOSIS  These cysts are commonly found during a routine or annual pelvic exam. Tests may be ordered to find out more about the cyst. These tests may include:  Ultrasound.  X-ray of the pelvis.  CT scan.  MRI.  Blood tests. TREATMENT  Many ovarian cysts go away on their own without treatment. Your health care provider may want to check your cyst regularly for 2-3 months to see if it changes. For women in menopause, it is particularly important to monitor a cyst closely because of the higher rate of ovarian cancer in menopausal women. When treatment is needed, it may include any of the following:  A procedure to drain the cyst (aspiration). This may be done using a long needle and ultrasound. It can also be done through a laparoscopic procedure. This involves using a thin, lighted tube with a tiny camera on the end (laparoscope) inserted through a small incision.  Surgery to remove the whole cyst. This may be done using laparoscopic surgery or an open surgery involving a larger incision in the lower abdomen.  Hormone treatment or birth control pills. These methods are sometimes used to help dissolve a cyst. HOME CARE INSTRUCTIONS   Only take over-the-counter   or prescription medicines as directed by your health care provider.  Follow up with your health care provider as directed.  Get regular pelvic exams and Pap tests. SEEK MEDICAL CARE IF:   Your periods are late, irregular, or painful, or they stop.  Your pelvic pain or abdominal pain does not go away.  Your abdomen becomes  larger or swollen.  You have pressure on your bladder or trouble emptying your bladder completely.  You have pain during sexual intercourse.  You have feelings of fullness, pressure, or discomfort in your stomach.  You lose weight for no apparent reason.  You feel generally ill.  You become constipated.  You lose your appetite.  You develop acne.  You have an increase in body and facial hair.  You are gaining weight, without changing your exercise and eating habits.  You think you are pregnant. SEEK IMMEDIATE MEDICAL CARE IF:   You have increasing abdominal pain.  You feel sick to your stomach (nauseous), and you throw up (vomit).  You develop a fever that comes on suddenly.  You have abdominal pain during a bowel movement.  Your menstrual periods become heavier than usual. MAKE SURE YOU:  Understand these instructions.  Will watch your condition.  Will get help right away if you are not doing well or get worse. Document Released: 10/09/2005 Document Revised: 10/14/2013 Document Reviewed: 06/16/2013 [employer] Patient Information 2015 [employer]. This information is not intended to replace advice given to you by your health care provider. Make sure you discuss any questions you have with your health care provider.  

## 2014-08-24 ENCOUNTER — Encounter: Payer: Self-pay | Admitting: Obstetrics & Gynecology

## 2014-09-22 ENCOUNTER — Emergency Department (HOSPITAL_COMMUNITY)
Admission: EM | Admit: 2014-09-22 | Discharge: 2014-09-22 | Disposition: A | Discharge status: Home / Self Care | Payer: Medicaid Other | Attending: Emergency Medicine | Admitting: Emergency Medicine

## 2014-09-22 ENCOUNTER — Encounter (HOSPITAL_COMMUNITY): Payer: Self-pay | Admitting: Emergency Medicine

## 2014-09-22 ENCOUNTER — Emergency Department (HOSPITAL_COMMUNITY): Payer: Medicaid Other

## 2014-09-22 DIAGNOSIS — Z793 Long term (current) use of hormonal contraceptives: Secondary | ICD-10-CM | POA: Insufficient documentation

## 2014-09-22 DIAGNOSIS — Z8619 Personal history of other infectious and parasitic diseases: Secondary | ICD-10-CM | POA: Insufficient documentation

## 2014-09-22 DIAGNOSIS — K219 Gastro-esophageal reflux disease without esophagitis: Secondary | ICD-10-CM | POA: Diagnosis not present

## 2014-09-22 DIAGNOSIS — F419 Anxiety disorder, unspecified: Secondary | ICD-10-CM | POA: Diagnosis not present

## 2014-09-22 DIAGNOSIS — Z792 Long term (current) use of antibiotics: Secondary | ICD-10-CM | POA: Diagnosis not present

## 2014-09-22 DIAGNOSIS — Z8742 Personal history of other diseases of the female genital tract: Secondary | ICD-10-CM | POA: Diagnosis not present

## 2014-09-22 DIAGNOSIS — K589 Irritable bowel syndrome without diarrhea: Secondary | ICD-10-CM | POA: Diagnosis not present

## 2014-09-22 DIAGNOSIS — R197 Diarrhea, unspecified: Secondary | ICD-10-CM | POA: Diagnosis not present

## 2014-09-22 DIAGNOSIS — D649 Anemia, unspecified: Secondary | ICD-10-CM | POA: Insufficient documentation

## 2014-09-22 DIAGNOSIS — Z9049 Acquired absence of other specified parts of digestive tract: Secondary | ICD-10-CM | POA: Insufficient documentation

## 2014-09-22 DIAGNOSIS — R109 Unspecified abdominal pain: Secondary | ICD-10-CM | POA: Diagnosis present

## 2014-09-22 DIAGNOSIS — I880 Nonspecific mesenteric lymphadenitis: Secondary | ICD-10-CM | POA: Diagnosis not present

## 2014-09-22 DIAGNOSIS — Z79899 Other long term (current) drug therapy: Secondary | ICD-10-CM | POA: Diagnosis not present

## 2014-09-22 DIAGNOSIS — Z720 Tobacco use: Secondary | ICD-10-CM | POA: Insufficient documentation

## 2014-09-22 DIAGNOSIS — Z3202 Encounter for pregnancy test, result negative: Secondary | ICD-10-CM | POA: Insufficient documentation

## 2014-09-22 DIAGNOSIS — Z8632 Personal history of gestational diabetes: Secondary | ICD-10-CM | POA: Insufficient documentation

## 2014-09-22 HISTORY — DX: Irritable bowel syndrome, unspecified: K58.9

## 2014-09-22 LAB — CBC WITH DIFFERENTIAL/PLATELET
BASOS PCT: 1 % (ref 0–1)
Basophils Absolute: 0.1 10*3/uL (ref 0.0–0.1)
EOS ABS: 0.1 10*3/uL (ref 0.0–0.7)
Eosinophils Relative: 2 % (ref 0–5)
HCT: 36.3 % (ref 36.0–46.0)
Hemoglobin: 11.7 g/dL — ABNORMAL LOW (ref 12.0–15.0)
Lymphocytes Relative: 32 % (ref 12–46)
Lymphs Abs: 1.3 10*3/uL (ref 0.7–4.0)
MCH: 26.2 pg (ref 26.0–34.0)
MCHC: 32.2 g/dL (ref 30.0–36.0)
MCV: 81.2 fL (ref 78.0–100.0)
MONO ABS: 0.2 10*3/uL (ref 0.1–1.0)
Monocytes Relative: 5 % (ref 3–12)
NEUTROS ABS: 2.5 10*3/uL (ref 1.7–7.7)
NEUTROS PCT: 60 % (ref 43–77)
Platelets: 274 10*3/uL (ref 150–400)
RBC: 4.47 MIL/uL (ref 3.87–5.11)
RDW: 14.1 % (ref 11.5–15.5)
WBC: 4.1 10*3/uL (ref 4.0–10.5)
nRBC: 0 /100 WBC

## 2014-09-22 LAB — COMPREHENSIVE METABOLIC PANEL
ALBUMIN: 3.5 g/dL (ref 3.5–5.2)
ALT: 13 U/L (ref 0–35)
AST: 14 U/L (ref 0–37)
Alkaline Phosphatase: 65 U/L (ref 39–117)
Anion gap: 12 (ref 5–15)
BUN: 11 mg/dL (ref 6–23)
CO2: 24 mEq/L (ref 19–32)
CREATININE: 0.75 mg/dL (ref 0.50–1.10)
Calcium: 9 mg/dL (ref 8.4–10.5)
Chloride: 101 mEq/L (ref 96–112)
GFR calc Af Amer: >90 mL/min (ref >90)
GFR calc non Af Amer: >90 mL/min (ref >90)
Glucose, Bld: 122 mg/dL — ABNORMAL HIGH (ref 70–99)
Potassium: 4 mEq/L (ref 3.7–5.3)
Sodium: 137 mEq/L (ref 137–147)
TOTAL PROTEIN: 7.8 g/dL (ref 6.0–8.3)
Total Bilirubin: 0.3 mg/dL (ref 0.3–1.2)

## 2014-09-22 LAB — URINALYSIS, ROUTINE W REFLEX MICROSCOPIC
Bilirubin Urine: NEGATIVE
Glucose, UA: NEGATIVE mg/dL
Hgb urine dipstick: NEGATIVE
Ketones, ur: NEGATIVE mg/dL
Leukocytes, UA: NEGATIVE
NITRITE: NEGATIVE
Protein, ur: NEGATIVE mg/dL
SPECIFIC GRAVITY, URINE: 1.031 — AB (ref 1.005–1.030)
Urobilinogen, UA: 0.2 mg/dL (ref 0.0–1.0)
pH: 6 (ref 5.0–8.0)

## 2014-09-22 LAB — LIPASE, BLOOD: Lipase: 32 U/L (ref 11–59)

## 2014-09-22 LAB — PREGNANCY, URINE: Preg Test, Ur: NEGATIVE

## 2014-09-22 MED ORDER — MORPHINE SULFATE 4 MG/ML IJ SOLN
4.0000 mg | Freq: Once | INTRAMUSCULAR | Status: AC
  Administered 2014-09-22: 4 mg via INTRAVENOUS
  Filled 2014-09-22: qty 1

## 2014-09-22 MED ORDER — SODIUM CHLORIDE 0.9 % IV SOLN
1000.0000 mL | INTRAVENOUS | Status: DC
Start: 2014-09-22 — End: 2014-09-22
  Administered 2014-09-22: 1000 mL via INTRAVENOUS

## 2014-09-22 MED ORDER — SODIUM CHLORIDE 0.9 % IV SOLN
1000.0000 mL | Freq: Once | INTRAVENOUS | Status: AC
  Administered 2014-09-22: 1000 mL via INTRAVENOUS

## 2014-09-22 MED ORDER — OXYCODONE-ACETAMINOPHEN 5-325 MG PO TABS: 1.0000 | ORAL_TABLET | Freq: Four times a day (QID) | ORAL | Status: DC | PRN

## 2014-09-22 MED ORDER — CIPROFLOXACIN HCL 500 MG PO TABS
500.0000 mg | ORAL_TABLET | Freq: Once | ORAL | Status: AC
Start: 2014-09-22 — End: 2014-09-22
  Administered 2014-09-22: 500 mg via ORAL
  Filled 2014-09-22: qty 1

## 2014-09-22 MED ORDER — ONDANSETRON HCL 4 MG/2ML IJ SOLN
4.0000 mg | Freq: Once | INTRAMUSCULAR | Status: AC
  Administered 2014-09-22: 4 mg via INTRAVENOUS
  Filled 2014-09-22: qty 2

## 2014-09-22 MED ORDER — CIPROFLOXACIN HCL 500 MG PO TABS: 500.0000 mg | ORAL_TABLET | Freq: Two times a day (BID) | ORAL | Status: DC

## 2014-09-22 MED ORDER — IOHEXOL 300 MG/ML  SOLN
80.0000 mL | Freq: Once | INTRAMUSCULAR | Status: AC | PRN
Start: 2014-09-22 — End: 2014-09-22
  Administered 2014-09-22: 80 mL via INTRAVENOUS

## 2014-09-22 NOTE — Discharge Instructions (Signed)
Return if symptoms are getting worse.  Bloody Diarrhea Bloody diarrhea can be caused by many different conditions. Most of the time bloody diarrhea is the result of food poisoning or minor infections. Bloody diarrhea usually improves over 2 to 3 days of rest and fluid replacement. Other conditions that can cause bloody diarrhea include:  Internal bleeding.  Infection.  Diseases of the bowel and colon. Internal bleeding from an ulcer or bowel disease can be severe and requires hospital care or even surgery. DIAGNOSIS  To find out what is wrong your caregiver may check your:  Stool.  Blood.  Results from a test that looks inside the body (endoscopy). TREATMENT   Get plenty of rest.  Drink enough water and fluids to keep your urine clear or pale yellow.  Do not smoke.  Solid foods and dairy products should be avoided until your illness improves.  As you improve, slowly return to a regular diet with easily-digested foods first. Examples are:  Bananas.  Rice.  Toast.  Crackers. You should only need these for about 2 days before adding more normal foods to your diet.  Avoid spicy or fatty foods as well as caffeine and alcohol for several days.  Medicine to control cramping and diarrhea can relieve symptoms but may prolong some cases of bloody diarrhea. Antibiotics can speed recovery from diarrhea due to some bacterial infections. Call your caregiver if diarrhea does not get better in 3 days. SEEK MEDICAL CARE IF:   You do not improve after 3 days.  Your diarrhea improves but your stool appears black. SEEK IMMEDIATE MEDICAL CARE IF:   You become extremely weak or faint.  You become very sweaty.  You have increased pain or bleeding.  You develop repeated vomiting.  You vomit and you see blood or the vomit looks black in color.  You have a fever. Document Released: 10/09/2005 Document Revised: 01/01/2012 Document Reviewed: 09/10/2009 [facility] Patient  Information 2015 [place], [place]. This information is not intended to replace advice given to you by your health care provider. Make sure you discuss any questions you have with your health care provider.  Abdominal Pain Many things can cause abdominal pain. Usually, abdominal pain is not caused by a disease and will improve without treatment. It can often be observed and treated at home. Your health care provider will do a physical exam and possibly order blood tests and X-rays to help determine the seriousness of your pain. However, in many cases, more time must pass before a clear cause of the pain can be found. Before that point, your health care provider may not know if you need more testing or further treatment. HOME CARE INSTRUCTIONS  Monitor your abdominal pain for any changes. The following actions may help to alleviate any discomfort you are experiencing:  Only take over-the-counter or prescription medicines as directed by your health care provider.  Do not take laxatives unless directed to do so by your health care provider.  Try a clear liquid diet (broth, tea, or water) as directed by your health care provider. Slowly move to a bland diet as tolerated. SEEK MEDICAL CARE IF:  You have unexplained abdominal pain.  You have abdominal pain associated with nausea or diarrhea.  You have pain when you urinate or have a bowel movement.  You experience abdominal pain that wakes you in the night.  You have abdominal pain that is worsened or improved by eating food.  You have abdominal pain that is worsened with eating fatty  foods.  You have a fever. SEEK IMMEDIATE MEDICAL CARE IF:   Your pain does not go away within 2 hours.  You keep throwing up (vomiting).  Your pain is felt only in portions of the abdomen, such as the right side or the left lower portion of the abdomen.  You pass bloody or black tarry stools. MAKE SURE YOU:  Understand these instructions.   Will watch  your condition.   Will get help right away if you are not doing well or get worse.  Document Released: 07/19/2005 Document Revised: 10/14/2013 Document Reviewed: 06/18/2013 [facility] Patient Information 2015 [place], [place]. This information is not intended to replace advice given to you by your health care provider. Make sure you discuss any questions you have with your health care provider.  Ciprofloxacin tablets What is this medicine? CIPROFLOXACIN (sip roe FLOX a sin) is a quinolone antibiotic. It is used to treat certain kinds of bacterial infections. It will not work for colds, flu, or other viral infections. This medicine may be used for other purposes; ask your health care provider or pharmacist if you have questions. COMMON BRAND NAME(S): Cipro What should I tell my health care provider before I take this medicine? They need to know if you have any of these conditions: -bone problems -cerebral disease -joint problems -irregular heartbeat -kidney disease -liver disease -myasthenia gravis -seizure disorder -tendon problems -an unusual or allergic reaction to ciprofloxacin, other antibiotics or medicines, foods, dyes, or preservatives -pregnant or trying to get pregnant -breast-feeding How should I use this medicine? Take this medicine by mouth with a glass of water. Follow the directions on the prescription label. Take your medicine at regular intervals. Do not take your medicine more often than directed. Take all of your medicine as directed even if you think your are better. Do not skip doses or stop your medicine early. You can take this medicine with food or on an empty stomach. It can be taken with a meal that contains dairy or calcium, but do not take it alone with a dairy product, like milk or yogurt or calcium-fortified juice. A special MedGuide will be given to you by the pharmacist with each prescription and refill. Be sure to read this information carefully each  time. Talk to your pediatrician regarding the use of this medicine in children. Special care may be needed. Overdosage: If you think you have taken too much of this medicine contact a poison control center or emergency room at once. NOTE: This medicine is only for you. Do not share this medicine with others. What if I miss a dose? If you miss a dose, take it as soon as you can. If it is almost time for your next dose, take only that dose. Do not take double or extra doses. What may interact with this medicine? Do not take this medicine with any of the following medications: -cisapride -droperidol -terfenadine -tizanidine This medicine may also interact with the following medications: -antacids -birth control pills -caffeine -cyclosporin -didanosine (ddI) buffered tablets or powder -medicines for diabetes -medicines for inflammation like ibuprofen, naproxen -methotrexate -multivitamins -omeprazole -phenytoin -probenecid -sucralfate -theophylline -warfarin This list may not describe all possible interactions. Give your health care provider a list of all the medicines, herbs, non-prescription drugs, or dietary supplements you use. Also tell them if you smoke, drink alcohol, or use illegal drugs. Some items may interact with your medicine. What should I watch for while using this medicine? Tell your doctor or health care professional  if your symptoms do not improve. Do not treat diarrhea with over the counter products. Contact your doctor if you have diarrhea that lasts more than 2 days or if it is severe and watery. You may get drowsy or dizzy. Do not drive, use machinery, or do anything that needs mental alertness until you know how this medicine affects you. Do not stand or sit up quickly, especially if you are an older patient. This reduces the risk of dizzy or fainting spells. This medicine can make you more sensitive to the sun. Keep out of the sun. If you cannot avoid being in the  sun, wear protective clothing and use sunscreen. Do not use sun lamps or tanning beds/booths. Avoid antacids, aluminum, calcium, iron, magnesium, and zinc products for 6 hours before and 2 hours after taking a dose of this medicine. What side effects may I notice from receiving this medicine? Side effects that you should report to your doctor or health care professional as soon as possible: - allergic reactions like skin rash, itching or hives, swelling of the face, lips, or tongue - breathing problems - confusion, nightmares or hallucinations - feeling faint or lightheaded, falls - irregular heartbeat - joint, muscle or tendon pain or swelling - pain or trouble passing urine -persistent headache with or without blurred vision - redness, blistering, peeling or loosening of the skin, including inside the mouth - seizure - unusual pain, numbness, tingling, or weakness Side effects that usually do not require medical attention (report to your doctor or health care professional if they continue or are bothersome): - diarrhea - nausea or stomach upset - white patches or sores in the mouth This list may not describe all possible side effects. Call your doctor for medical advice about side effects. You may report side effects to FDA at 1-800-FDA-1088. Where should I keep my medicine? Keep out of the reach of children. Store at room temperature below 30 degrees C (86 degrees F). Keep container tightly closed. Throw away any unused medicine after the expiration date. NOTE: This sheet is a summary. It may not cover all possible information. If you have questions about this medicine, talk to your doctor, pharmacist, or health care provider.  2015, [employer]. (2013-05-15 16:10:46)  Acetaminophen; Oxycodone tablets What is this medicine? ACETAMINOPHEN; OXYCODONE (a set a MEE noe fen; ox i KOE done) is a pain reliever. It is used to treat mild to moderate pain. This medicine  may be used for other purposes; ask your health care provider or pharmacist if you have questions. COMMON BRAND NAME(S): Endocet, Magnacet, Narvox, Percocet, Perloxx, Primalev, Primlev, Roxicet, Xolox What should I tell my health care provider before I take this medicine? They need to know if you have any of these conditions: -brain tumor -Crohn's disease, inflammatory bowel disease, or ulcerative colitis -drug abuse or addiction -head injury -heart or circulation problems -if you often drink alcohol -kidney disease or problems going to the bathroom -liver disease -lung disease, asthma, or breathing problems -an unusual or allergic reaction to acetaminophen, oxycodone, other opioid analgesics, other medicines, foods, dyes, or preservatives -pregnant or trying to get pregnant -breast-feeding How should I use this medicine? Take this medicine by mouth with a full glass of water. Follow the directions on the prescription label. Take your medicine at regular intervals. Do not take your medicine more often than directed. Talk to your pediatrician regarding the use of this medicine in children. Special care may be needed. Patients over 58 years old  may have a stronger reaction and need a smaller dose. Overdosage: If you think you have taken too much of this medicine contact a poison control center or emergency room at once. NOTE: This medicine is only for you. Do not share this medicine with others. What if I miss a dose? If you miss a dose, take it as soon as you can. If it is almost time for your next dose, take only that dose. Do not take double or extra doses. What may interact with this medicine? -alcohol -antihistamines -barbiturates like amobarbital, butalbital, butabarbital, methohexital, pentobarbital, phenobarbital, thiopental, and secobarbital -benztropine -drugs for bladder problems like solifenacin, trospium, oxybutynin, tolterodine, hyoscyamine, and methscopolamine -drugs for  breathing problems like ipratropium and tiotropium -drugs for certain stomach or intestine problems like propantheline, homatropine methylbromide, glycopyrrolate, atropine, belladonna, and dicyclomine -general anesthetics like etomidate, ketamine, nitrous oxide, propofol, desflurane, enflurane, halothane, isoflurane, and sevoflurane -medicines for depression, anxiety, or psychotic disturbances -medicines for sleep -muscle relaxants -naltrexone -narcotic medicines (opiates) for pain -phenothiazines like perphenazine, thioridazine, chlorpromazine, mesoridazine, fluphenazine, prochlorperazine, promazine, and trifluoperazine -scopolamine -tramadol -trihexyphenidyl This list may not describe all possible interactions. Give your health care provider a list of all the medicines, herbs, non-prescription drugs, or dietary supplements you use. Also tell them if you smoke, drink alcohol, or use illegal drugs. Some items may interact with your medicine. What should I watch for while using this medicine? Tell your doctor or health care professional if your pain does not go away, if it gets worse, or if you have new or a different type of pain. You may develop tolerance to the medicine. Tolerance means that you will need a higher dose of the medication for pain relief. Tolerance is normal and is expected if you take this medicine for a long time. Do not suddenly stop taking your medicine because you may develop a severe reaction. Your body becomes used to the medicine. This does NOT mean you are addicted. Addiction is a behavior related to getting and using a drug for a non-medical reason. If you have pain, you have a medical reason to take pain medicine. Your doctor will tell you how much medicine to take. If your doctor wants you to stop the medicine, the dose will be slowly lowered over time to avoid any side effects. You may get drowsy or dizzy. Do not drive, use machinery, or do anything that needs mental  alertness until you know how this medicine affects you. Do not stand or sit up quickly, especially if you are an older patient. This reduces the risk of dizzy or fainting spells. Alcohol may interfere with the effect of this medicine. Avoid alcoholic drinks. There are different types of narcotic medicines (opiates) for pain. If you take more than one type at the same time, you may have more side effects. Give your health care provider a list of all medicines you use. Your doctor will tell you how much medicine to take. Do not take more medicine than directed. Call emergency for help if you have problems breathing. The medicine will cause constipation. Try to have a bowel movement at least every 2 to 3 days. If you do not have a bowel movement for 3 days, call your doctor or health care professional. Do not take Tylenol (acetaminophen) or medicines that have acetaminophen with this medicine. Too much acetaminophen can be very dangerous. Many nonprescription medicines contain acetaminophen. Always read the labels carefully to avoid taking more acetaminophen. What side effects may I notice  from receiving this medicine? Side effects that you should report to your doctor or health care professional as soon as possible: -allergic reactions like skin rash, itching or hives, swelling of the face, lips, or tongue -breathing difficulties, wheezing -confusion -light headedness or fainting spells -severe stomach pain -unusually weak or tired -yellowing of the skin or the whites of the eyes Side effects that usually do not require medical attention (report to your doctor or health care professional if they continue or are bothersome): -dizziness -drowsiness -nausea -vomiting This list may not describe all possible side effects. Call your doctor for medical advice about side effects. You may report side effects to FDA at 1-800-FDA-1088. Where should I keep my medicine? Keep out of the reach of children. This  medicine can be abused. Keep your medicine in a safe place to protect it from theft. Do not share this medicine with anyone. Selling or giving away this medicine is dangerous and against the law. Store at room temperature between 20 and 25 degrees C (68 and 77 degrees F). Keep container tightly closed. Protect from light. This medicine may cause accidental overdose and death if it is taken by other adults, children, or pets. Flush any unused medicine down the toilet to reduce the chance of harm. Do not use the medicine after the expiration date. NOTE: This sheet is a summary. It may not cover all possible information. If you have questions about this medicine, talk to your doctor, pharmacist, or health care provider.  2015, [employer]. (2013-06-02 13:17:35)

## 2014-09-22 NOTE — ED Provider Notes (Signed)
CSN: [id number]     Arrival date & time 09/22/14  8676 History  This chart was scribed for Delora Fuel, MD by Starleen Arms, ED Scribe. This patient was seen in room A08C/A08C and the patient's care was started at 3:30 AM.   Chief Complaint  Patient presents with  . Abdominal Pain  . Emesis  . Diarrhea   HPI HPI Comments: Jodi Mcgee is a 28 y.o. female who presents to the Emergency Department complaining of 9/10 intermittent left-sided abdominal pain onset 5 days ago.  She also complains of associated bloody, watery, mucous-like diarrhea onset 5 days ago as well as 2 episodes of emesis at initial onset.  She has had a decreased appetite since onset.  Patient was seen at [facility] 5 days ago where she was prescribed an antibiotic, anti-emetic, protonix, and another unknown medication.  She denies that these have relieved her abdominal pain but is still taking the medication currently.  Patient denies fever, chills, sweats. Past Medical History  Diagnosis Date  . Ovarian cyst   . Peritonitis, acute generalized   . Sepsis   . Anxiety   . Anemia   . Gestational diabetes   . GERD (gastroesophageal reflux disease)     has resolved  . IBS (irritable bowel syndrome)    Past Surgical History  Procedure Laterality Date  . Cesarean section    . Ovarian cyst drainage    . Appendectomy      ruptured    No family history on file. History  Substance Use Topics  . Smoking status: Current Every Day Smoker -- 0.25 packs/day    Types: Cigarettes  . Smokeless tobacco: Never Used  . Alcohol Use: Yes     Comment: occassional   OB History    Gravida Para Term Preterm AB TAB SAB Ectopic Multiple Living   2 2 2       2      Review of Systems    Allergies  Review of patient's allergies indicates no known allergies.  Home Medications   Prior to Admission medications   Medication Sig Start Date End Date Taking? Authorizing Provider  albuterol (PROVENTIL HFA;VENTOLIN HFA) 108  (90 BASE) MCG/ACT inhaler Inhale 1-2 puffs into the lungs every 6 (six) hours as needed for wheezing or shortness of breath. 11/17/13  Yes Ruthell Rummage Dammen, PA-C  dicyclomine (BENTYL) 20 MG tablet Take 1 tablet (20 mg total) by mouth 4 (four) times daily -  before meals and at bedtime. 10/18/13 10/18/14 Yes Manya Silvas, CNM  metroNIDAZOLE (FLAGYL) 500 MG tablet Take 500 mg by mouth 3 (three) times daily.   Yes Historical Provider, MD  norgestimate-ethinyl estradiol (ORTHO-CYCLEN,SPRINTEC,PREVIFEM) 0.25-35 MG-MCG tablet Take 1 tablet by mouth daily. 08/03/14  Yes Lavonia Drafts, MD  ondansetron (ZOFRAN) 4 MG tablet Take 1 tablet (4 mg total) by mouth every 6 (six) hours. Patient taking differently: Take 4 mg by mouth every 6 (six) hours as needed for nausea or vomiting.  07/28/14  Yes Shari A Upstill, PA-C  oxyCODONE-acetaminophen (PERCOCET/ROXICET) 5-325 MG per tablet Take 1-2 tablets by mouth every 6 (six) hours as needed for moderate pain or severe pain.   Yes Historical Provider, MD  pantoprazole (PROTONIX) 40 MG tablet Take 40 mg by mouth daily.   Yes Historical Provider, MD  sertraline (ZOLOFT) 100 MG tablet Take 100 mg by mouth daily.   Yes Historical Provider, MD  cephALEXin (KEFLEX) 500 MG capsule Take 1 capsule (500 mg total) by  mouth 4 (four) times daily. Patient not taking: Reported on 09/22/2014 07/28/14   Nehemiah Settle A Upstill, PA-C  ibuprofen (ADVIL,MOTRIN) 800 MG tablet Take 800 mg by mouth every 8 (eight) hours as needed for moderate pain.     Historical Provider, MD  polyethylene glycol powder (GLYCOLAX/MIRALAX) powder Take 17 g by mouth 3 (three) times daily. Until daily soft stools  OTC Patient not taking: Reported on 09/22/2014 01/07/14   Antonietta Breach, PA-C  traMADol (ULTRAM) 50 MG tablet Take 1 tablet (50 mg total) by mouth every 6 (six) hours as needed. Patient not taking: Reported on 09/22/2014 08/03/14   Lavonia Drafts, MD   BP 133/79 mmHg  Pulse 97  Temp(Src) 99.1 F  (37.3 C) (Oral)  Resp 14  Ht 5\' 6"  (1.676 m)  Wt 227 lb (102.967 kg)  BMI 36.66 kg/m2  SpO2 99%  LMP 09/06/2014 Physical Exam  28 year old female, resting comfortably and in no acute distress. Vital signs are normal. Oxygen saturation is 99%, which is normal. Head is normocephalic and atraumatic. PERRLA, EOMI. Oropharynx is clear. Neck is nontender and supple without adenopathy or JVD. Back is nontender and there is no CVA tenderness. Lungs are clear without rales, wheezes, or rhonchi. Chest is nontender. Heart has regular rate and rhythm without murmur. Abdomen is soft, flat, with mild tenderness in the periumbilical area and left side of abdomen. There is no rebound or guarding. There are no masses or hepatosplenomegaly and peristalsis is hypoactive. Extremities have no cyanosis or edema, full range of motion is present. Skin is warm and dry without rash. Neurologic: Mental status is normal, cranial nerves are intact, there are no motor or sensory deficits.  ED Course  Procedures (including critical care time)  DIAGNOSTIC STUDIES: Oxygen Saturation is 99% on RA, normal by my interpretation.    COORDINATION OF CARE:  3:35 AM Discussed treatment plan with patient at bedside.  Patient acknowledges and agrees with plan.    Labs Review Results for orders placed or performed during the hospital encounter of 09/22/14  CBC with Differential  Result Value Ref Range   WBC 4.1 4.0 - 10.5 K/uL   RBC 4.47 3.87 - 5.11 MIL/uL   Hemoglobin 11.7 (L) 12.0 - 15.0 g/dL   HCT 36.3 36.0 - 46.0 %   MCV 81.2 78.0 - 100.0 fL   MCH 26.2 26.0 - 34.0 pg   MCHC 32.2 30.0 - 36.0 g/dL   RDW 14.1 11.5 - 15.5 %   Platelets 274 150 - 400 K/uL   Neutrophils Relative % 60 43 - 77 %   Neutro Abs 2.5 1.7 - 7.7 K/uL   Lymphocytes Relative 32 12 - 46 %   Lymphs Abs 1.3 0.7 - 4.0 K/uL   Monocytes Relative 5 3 - 12 %   Monocytes Absolute 0.2 0.1 - 1.0 K/uL   Eosinophils Relative 2 0 - 5 %   Eosinophils  Absolute 0.1 0.0 - 0.7 K/uL   Basophils Relative 1 0 - 1 %   Basophils Absolute 0.1 0.0 - 0.1 K/uL   nRBC 0 0 /100 WBC  Comprehensive metabolic panel  Result Value Ref Range   Sodium 137 137 - 147 mEq/L   Potassium 4.0 3.7 - 5.3 mEq/L   Chloride 101 96 - 112 mEq/L   CO2 24 19 - 32 mEq/L   Glucose, Bld 122 (H) 70 - 99 mg/dL   BUN 11 6 - 23 mg/dL   Creatinine, Ser 0.75 0.50 - 1.10  mg/dL   Calcium 9.0 8.4 - 10.5 mg/dL   Total Protein 7.8 6.0 - 8.3 g/dL   Albumin 3.5 3.5 - 5.2 g/dL   AST 14 0 - 37 U/L   ALT 13 0 - 35 U/L   Alkaline Phosphatase 65 39 - 117 U/L   Total Bilirubin 0.3 0.3 - 1.2 mg/dL   GFR calc non Af Amer >90 >90 mL/min   GFR calc Af Amer >90 >90 mL/min   Anion gap 12 5 - 15  Lipase, blood  Result Value Ref Range   Lipase 32 11 - 59 U/L   Imaging Review Ct Abdomen Pelvis W Contrast  09/22/2014   CLINICAL DATA:  Bilateral lower abdominal pain with nausea, vomiting, and bloody diarrhea  EXAM: CT ABDOMEN AND PELVIS WITH CONTRAST  TECHNIQUE: Multidetector CT imaging of the abdomen and pelvis was performed using the standard protocol following bolus administration of intravenous contrast. Oral contrast was also administered.  CONTRAST:  58mL OMNIPAQUE IOHEXOL 300 MG/ML  SOLN  COMPARISON:  September 17, 2014  FINDINGS: Lung bases are clear.  No focal liver lesions are identified. The gallbladder wall is not appreciably thickened. There is no biliary duct dilatation.  Spleen is mildly prominent measuring 13.3 x 13.6 x 6.5 cm. No focal splenic lesions are identified.  Pancreas and adrenals appear normal. Kidneys bilaterally show no evidence of mass or hydronephrosis on either side. There is no renal or ureteral calculus on either side.  In the pelvis, the urinary bladder is midline with normal wall thickness. There is a right adnexal mass with attenuation slightly higher than is expected with a simple cyst. There is some mild enhancement of the wall of the right adnexal mass. This mass  currently measures 4.6 by 4.0 cm. On the recent prior study, this mass measured 4.8 x 4.4 cm. There is no new pelvic mass. There is no pelvic fluid. There are a few scattered sigmoid diverticula without diverticulitis.  There is no bowel obstruction. No free air or portal venous air. Appendix is absent.  There is no appreciable ascites or abscess in the abdomen or pelvis. There are several stable retroperitoneal lymph nodes, none of which meet size criteria for pathologic significance. The mesentery surrounding these lymph nodes is mildly hazy, particularly in the left periaortic region, a stable finding compared to recent prior study. Similar mesenteric thickening is not seen elsewhere. Elsewhere, there are scattered subcentimeter lymph nodes throughout the mesenteric a. By size criteria there is no frank adenopathy on this study.  There is no demonstrable abdominal aortic aneurysm. There are no blastic or lytic bone lesions. No abdominal wall lesions are appreciable.  IMPRESSION: Probable hemorrhagic cyst right ovary, minimally smaller compared to recent prior study.  Multiple subcentimeter mid abdominal mesenteric and retroperitoneal lymph nodes. There is some mild hazy appearance to the left retroperitoneum in the area of the small lymph nodes. Question a degree of mesenteric adenitis.  There is no appreciable colonic wall thickening. There are a few sigmoid diverticula without diverticulitis. A cause for bloody diarrhea has not been established with this study.  Spleen is slightly enlarged but otherwise unremarkable in appearance.   Electronically Signed   By: Lowella Grip M.D.   On: 09/22/2014 07:08     MDM   Final diagnoses:  Abdominal pain, unspecified abdominal location  Bloody diarrhea  Normochromic anemia  Mesenteric adenitis    Bloody diarrhea and abdominal pain consistent with bacterial dysentery. History is somewhat confusing as the patient  states that she had been seen 5 days ago at  another ED and given 4 prescriptions including 2 antibiotics but she cannot recall what they are. CT scan will be obtained to clarify abdominal pathology.  WBC is normal and only mild anemia is noted. CT is significant only for presence of mesenteric adenopathy which may account for her abdominal pain. There is no obvious evidence of colitis. She has received morphine for pain which does give her temporary relief. She will be discharged with a three-day course of ciprofloxacin which would treat Salmonella or Shigella and she is referred to gastroenterology for further evaluation. She is given a prescription for oxycodone acetaminophen for pain. Of note, an attempt was made to obtain her recent ED record from [facility] but was unsuccessful.  I personally performed the services described in this documentation, which was scribed in my presence. The recorded information has been reviewed and is accurate.     Delora Fuel, MD 87/68/11 [id number]

## 2014-09-22 NOTE — ED Notes (Signed)
Pt. reports generalized abdominal pain with emesis and bloody diarrhea onset last week , denies fever or chills.

## 2014-09-22 NOTE — ED Notes (Signed)
Patient requested pain medication MD made aware; no recommendations at this time.

## 2014-10-21 ENCOUNTER — Emergency Department (HOSPITAL_COMMUNITY)
Admission: EM | Admit: 2014-10-21 | Discharge: 2014-10-21 | Disposition: A | Discharge status: Home / Self Care | Payer: Medicaid Other | Attending: Emergency Medicine | Admitting: Emergency Medicine

## 2014-10-21 ENCOUNTER — Emergency Department (HOSPITAL_COMMUNITY): Payer: Medicaid Other

## 2014-10-21 ENCOUNTER — Encounter (HOSPITAL_COMMUNITY): Payer: Self-pay | Admitting: Emergency Medicine

## 2014-10-21 DIAGNOSIS — Z8619 Personal history of other infectious and parasitic diseases: Secondary | ICD-10-CM | POA: Insufficient documentation

## 2014-10-21 DIAGNOSIS — Z3202 Encounter for pregnancy test, result negative: Secondary | ICD-10-CM | POA: Insufficient documentation

## 2014-10-21 DIAGNOSIS — Z720 Tobacco use: Secondary | ICD-10-CM | POA: Diagnosis not present

## 2014-10-21 DIAGNOSIS — Z8632 Personal history of gestational diabetes: Secondary | ICD-10-CM | POA: Diagnosis not present

## 2014-10-21 DIAGNOSIS — Z9889 Other specified postprocedural states: Secondary | ICD-10-CM | POA: Insufficient documentation

## 2014-10-21 DIAGNOSIS — N858 Other specified noninflammatory disorders of uterus: Secondary | ICD-10-CM | POA: Insufficient documentation

## 2014-10-21 DIAGNOSIS — Z8719 Personal history of other diseases of the digestive system: Secondary | ICD-10-CM | POA: Diagnosis not present

## 2014-10-21 DIAGNOSIS — Z8659 Personal history of other mental and behavioral disorders: Secondary | ICD-10-CM | POA: Diagnosis not present

## 2014-10-21 DIAGNOSIS — R197 Diarrhea, unspecified: Secondary | ICD-10-CM | POA: Diagnosis not present

## 2014-10-21 DIAGNOSIS — Z9089 Acquired absence of other organs: Secondary | ICD-10-CM | POA: Insufficient documentation

## 2014-10-21 DIAGNOSIS — R102 Pelvic and perineal pain: Secondary | ICD-10-CM

## 2014-10-21 DIAGNOSIS — Z862 Personal history of diseases of the blood and blood-forming organs and certain disorders involving the immune mechanism: Secondary | ICD-10-CM | POA: Diagnosis not present

## 2014-10-21 DIAGNOSIS — R1084 Generalized abdominal pain: Secondary | ICD-10-CM | POA: Diagnosis present

## 2014-10-21 DIAGNOSIS — Z79899 Other long term (current) drug therapy: Secondary | ICD-10-CM | POA: Diagnosis not present

## 2014-10-21 LAB — COMPREHENSIVE METABOLIC PANEL
ALT: 19 U/L (ref 0–35)
AST: 18 U/L (ref 0–37)
Albumin: 3.9 g/dL (ref 3.5–5.2)
Alkaline Phosphatase: 69 U/L (ref 39–117)
Anion gap: 3 — ABNORMAL LOW (ref 5–15)
BUN: 12 mg/dL (ref 6–23)
CO2: 27 mmol/L (ref 19–32)
Calcium: 8.8 mg/dL (ref 8.4–10.5)
Chloride: 104 mEq/L (ref 96–112)
Creatinine, Ser: 0.77 mg/dL (ref 0.50–1.10)
GFR calc Af Amer: >90 mL/min (ref >90)
GFR calc non Af Amer: >90 mL/min (ref >90)
Glucose, Bld: 132 mg/dL — ABNORMAL HIGH (ref 70–99)
Potassium: 3.7 mmol/L (ref 3.5–5.1)
Sodium: 134 mmol/L — ABNORMAL LOW (ref 135–145)
Total Bilirubin: 0.4 mg/dL (ref 0.3–1.2)
Total Protein: 7.7 g/dL (ref 6.0–8.3)

## 2014-10-21 LAB — URINE MICROSCOPIC-ADD ON

## 2014-10-21 LAB — LIPASE, BLOOD: Lipase: 27 U/L (ref 11–59)

## 2014-10-21 LAB — URINALYSIS, ROUTINE W REFLEX MICROSCOPIC
GLUCOSE, UA: NEGATIVE mg/dL
Ketones, ur: NEGATIVE mg/dL
Nitrite: NEGATIVE
PH: 6 (ref 5.0–8.0)
Protein, ur: 100 mg/dL — AB
SPECIFIC GRAVITY, URINE: 1.039 — AB (ref 1.005–1.030)
Urobilinogen, UA: 1 mg/dL (ref 0.0–1.0)

## 2014-10-21 LAB — HIV ANTIBODY (ROUTINE TESTING W REFLEX): HIV 1&2 Ab, 4th Generation: NONREACTIVE

## 2014-10-21 LAB — CBC WITH DIFFERENTIAL/PLATELET
Basophils Absolute: 0 10*3/uL (ref 0.0–0.1)
Basophils Relative: 0 % (ref 0–1)
Eosinophils Absolute: 0.1 10*3/uL (ref 0.0–0.7)
Eosinophils Relative: 2 % (ref 0–5)
HCT: 37.2 % (ref 36.0–46.0)
Hemoglobin: 11.7 g/dL — ABNORMAL LOW (ref 12.0–15.0)
Lymphocytes Relative: 30 % (ref 12–46)
Lymphs Abs: 1.4 10*3/uL (ref 0.7–4.0)
MCH: 26.1 pg (ref 26.0–34.0)
MCHC: 31.5 g/dL (ref 30.0–36.0)
MCV: 82.9 fL (ref 78.0–100.0)
Monocytes Absolute: 0.3 10*3/uL (ref 0.1–1.0)
Monocytes Relative: 7 % (ref 3–12)
Neutro Abs: 2.8 10*3/uL (ref 1.7–7.7)
Neutrophils Relative %: 61 % (ref 43–77)
Platelets: 294 10*3/uL (ref 150–400)
RBC: 4.49 MIL/uL (ref 3.87–5.11)
RDW: 14.2 % (ref 11.5–15.5)
WBC: 4.5 10*3/uL (ref 4.0–10.5)

## 2014-10-21 LAB — POC OCCULT BLOOD, ED: Fecal Occult Bld: POSITIVE — AB

## 2014-10-21 LAB — PREGNANCY, URINE: PREG TEST UR: NEGATIVE

## 2014-10-21 LAB — WET PREP, GENITAL
Clue Cells Wet Prep HPF POC: NONE SEEN
Trich, Wet Prep: NONE SEEN
WBC, Wet Prep HPF POC: NONE SEEN
Yeast Wet Prep HPF POC: NONE SEEN

## 2014-10-21 MED ORDER — HYDROMORPHONE HCL 1 MG/ML IJ SOLN
1.0000 mg | Freq: Once | INTRAMUSCULAR | Status: AC
  Administered 2014-10-21: 1 mg via INTRAVENOUS
  Filled 2014-10-21: qty 1

## 2014-10-21 MED ORDER — MORPHINE SULFATE 4 MG/ML IJ SOLN
4.0000 mg | Freq: Once | INTRAMUSCULAR | Status: AC
  Administered 2014-10-21: 4 mg via INTRAVENOUS
  Filled 2014-10-21: qty 1

## 2014-10-21 MED ORDER — OXYCODONE-ACETAMINOPHEN 5-325 MG PO TABS: 1.0000 | ORAL_TABLET | Freq: Four times a day (QID) | ORAL | Status: DC | PRN

## 2014-10-21 MED ORDER — ONDANSETRON HCL 4 MG PO TABS: 4.0000 mg | ORAL_TABLET | Freq: Three times a day (TID) | ORAL | Status: DC | PRN

## 2014-10-21 MED ORDER — ONDANSETRON HCL 4 MG/2ML IJ SOLN
4.0000 mg | Freq: Once | INTRAMUSCULAR | Status: AC
  Administered 2014-10-21: 4 mg via INTRAVENOUS
  Filled 2014-10-21: qty 2

## 2014-10-21 MED ORDER — SODIUM CHLORIDE 0.9 % IV BOLUS (SEPSIS)
1000.0000 mL | Freq: Once | INTRAVENOUS | Status: AC
  Administered 2014-10-21: 1000 mL via INTRAVENOUS

## 2014-10-21 NOTE — Discharge Instructions (Signed)
Follow-up with OB/GYN at [facility] clinic. Follow-up with gastroenterology as well. Her turn to the ER with any worsening of your bleeding, severe pain, high fever, severe nausea, severe vomiting, dizziness or weakness.  Abdominal Pain Many things can cause abdominal pain. Usually, abdominal pain is not caused by a disease and will improve without treatment. It can often be observed and treated at home. Your health care provider will do a physical exam and possibly order blood tests and X-rays to help determine the seriousness of your pain. However, in many cases, more time must pass before a clear cause of the pain can be found. Before that point, your health care provider may not know if you need more testing or further treatment. HOME CARE INSTRUCTIONS  Monitor your abdominal pain for any changes. The following actions may help to alleviate any discomfort you are experiencing:  Only take over-the-counter or prescription medicines as directed by your health care provider.  Do not take laxatives unless directed to do so by your health care provider.  Try a clear liquid diet (broth, tea, or water) as directed by your health care provider. Slowly move to a bland diet as tolerated. SEEK MEDICAL CARE IF:  You have unexplained abdominal pain.  You have abdominal pain associated with nausea or diarrhea.  You have pain when you urinate or have a bowel movement.  You experience abdominal pain that wakes you in the night.  You have abdominal pain that is worsened or improved by eating food.  You have abdominal pain that is worsened with eating fatty foods.  You have a fever. SEEK IMMEDIATE MEDICAL CARE IF:   Your pain does not go away within 2 hours.  You keep throwing up (vomiting).  Your pain is felt only in portions of the abdomen, such as the right side or the left lower portion of the abdomen.  You pass bloody or black tarry stools. MAKE SURE YOU:  Understand these instructions.    Will watch your condition.   Will get help right away if you are not doing well or get worse.  Document Released: 07/19/2005 Document Revised: 10/14/2013 Document Reviewed: 06/18/2013 [facility] Patient Information 2015 [place], [place]. This information is not intended to replace advice given to you by your health care provider. Make sure you discuss any questions you have with your health care provider.  Abnormal Uterine Bleeding Abnormal uterine bleeding can affect women at various stages in life, including teenagers, women in their reproductive years, pregnant women, and women who have reached menopause. Several kinds of uterine bleeding are considered abnormal, including:  Bleeding or spotting between periods.   Bleeding after sexual intercourse.   Bleeding that is heavier or more than normal.   Periods that last longer than usual.  Bleeding after menopause.  Many cases of abnormal uterine bleeding are minor and simple to treat, while others are more serious. Any type of abnormal bleeding should be evaluated by your health care provider. Treatment will depend on the cause of the bleeding. HOME CARE INSTRUCTIONS Monitor your condition for any changes. The following actions may help to alleviate any discomfort you are experiencing:  Avoid the use of tampons and douches as directed by your health care provider.  Change your pads frequently. You should get regular pelvic exams and Pap tests. Keep all follow-up appointments for diagnostic tests as directed by your health care provider.  SEEK MEDICAL CARE IF:   Your bleeding lasts more than 1 week.   You feel dizzy  at times.  SEEK IMMEDIATE MEDICAL CARE IF:   You pass out.   You are changing pads every 15 to 30 minutes.   You have abdominal pain.  You have a fever.   You become sweaty or weak.   You are passing large blood clots from the vagina.   You start to feel nauseous and vomit. MAKE SURE YOU:    Understand these instructions.  Will watch your condition.  Will get help right away if you are not doing well or get worse. Document Released: 10/09/2005 Document Revised: 10/14/2013 Document Reviewed: 05/08/2013 [facility] Patient Information 2015 [place], [place]. This information is not intended to replace advice given to you by your health care provider. Make sure you discuss any questions you have with your health care provider.

## 2014-10-21 NOTE — ED Provider Notes (Signed)
Medical screening examination/treatment/procedure(s) were conducted as a shared visit with non-physician practitioner(s) and myself.  I personally evaluated the patient during the encounter.   EKG Interpretation None      Seen and personally examined LLQ abdominal pain.  Seen for same 3 weeks ago and had negative Ctr now having vaginal bleeding and recurrent symptoms.  Has been seen for same at [facility] earlier this year  Resting comfortably in the room, no distress NCAT RRR CTAB NABS, soft non tender no guarding no rebound.   FROM AO3  Despite patient's stated rectal bleeding and menorrhagia, hemoglobin is stable since May 2015 (seen in Care Everywhere.  Exam and vitals are benign and reassuring.  I do not feel she required a CT scan at this time.  We will manage her pain and refer the patient to GYN and GI as an outpatient.    Carlisle Beers, MD 10/21/14 1431

## 2014-10-21 NOTE — ED Notes (Signed)
Pt states she has had diarrhea, lower ABD pain, and rectal bleeding. Pt states she is also having continuous vaginal bleeding since 12/13.

## 2014-10-21 NOTE — ED Notes (Signed)
Patient transported to Ultrasound 

## 2014-10-21 NOTE — ED Provider Notes (Signed)
CSN: [id number]     Arrival date & time 10/21/14  0049 History   First MD Initiated Contact with Patient 10/21/14 [phone]     Chief Complaint  Patient presents with  . Abdominal Pain  . Rectal Bleeding     (Consider location/radiation/quality/duration/timing/severity/associated sxs/prior Treatment) HPI Ms. Pacholski is a 28 year old female with past medical history of ovarian cysts, GERD, IBS who presents to the ER with 3 days of abdominal pain, diarrhea and 15 days of vaginal bleeding. Patient describes her abdominal pain as a generalized pain which is throbbing, and constant for the past 3 days. Patient associates a loose diarrhea with her abdominal pain, with streaking of bright red blood within the stool. Patient states she had an associated episode of generalized abdominal pain earlier this month which she was seen and evaluated [facility] for and noted to have mesenteric adenitis on CT abdomen pelvis. Patient also reports an associated mild nausea, and anorexia. Patient states her vaginal bleeding began on 10/04/14, and has persisted since. Patient states her bleeding began on a consistent date with one her menses should have begun, however typically her menses only lasts a proximally 5 days. Patient states she's been using 5 pads daily for her vaginal bleeding. Patient denies dizziness, lightheadedness, weakness, syncope, headache, chest pain, shortness of breath, vomiting, dysuria.  Past Medical History  Diagnosis Date  . Ovarian cyst   . Peritonitis, acute generalized   . Sepsis   . Anxiety   . Anemia   . Gestational diabetes   . GERD (gastroesophageal reflux disease)     has resolved  . IBS (irritable bowel syndrome)    Past Surgical History  Procedure Laterality Date  . Cesarean section    . Ovarian cyst drainage    . Appendectomy      ruptured    No family history on file. History  Substance Use Topics  . Smoking status: Current Every Day Smoker -- 0.25 packs/day    Types:  Cigarettes  . Smokeless tobacco: Never Used  . Alcohol Use: Yes     Comment: occassional   OB History    Gravida Para Term Preterm AB TAB SAB Ectopic Multiple Living   2 2 2       2      Review of Systems  Constitutional: Negative for fever.  HENT: Negative for trouble swallowing.   Eyes: Negative for visual disturbance.  Respiratory: Negative for shortness of breath.   Cardiovascular: Negative for chest pain.  Gastrointestinal: Positive for nausea, abdominal pain and diarrhea. Negative for vomiting.  Genitourinary: Positive for vaginal bleeding. Negative for dysuria, vaginal discharge and vaginal pain.  Musculoskeletal: Negative for neck pain.  Skin: Negative for rash.  Neurological: Negative for dizziness, weakness and numbness.  Psychiatric/Behavioral: Negative.       Allergies  Contrast media  Home Medications   Prior to Admission medications   Medication Sig Start Date End Date Taking? Authorizing Provider  dicyclomine (BENTYL) 20 MG tablet Take 1 tablet (20 mg total) by mouth 4 (four) times daily -  before meals and at bedtime. 10/18/13 10/22/15 Yes Manya Silvas, CNM  albuterol (PROVENTIL HFA;VENTOLIN HFA) 108 (90 BASE) MCG/ACT inhaler Inhale 1-2 puffs into the lungs every 6 (six) hours as needed for wheezing or shortness of breath. Patient not taking: Reported on 10/21/2014 11/17/13   Ruthell Rummage Dammen, PA-C  cephALEXin (KEFLEX) 500 MG capsule Take 1 capsule (500 mg total) by mouth 4 (four) times daily. Patient not taking: Reported on  09/22/2014 07/28/14   Shari A Upstill, PA-C  ciprofloxacin (CIPRO) 500 MG tablet Take 1 tablet (500 mg total) by mouth 2 (two) times daily. Patient not taking: Reported on 10/21/2014 16/9/67   Delora Fuel, MD  norgestimate-ethinyl estradiol (ORTHO-CYCLEN,SPRINTEC,PREVIFEM) 0.25-35 MG-MCG tablet Take 1 tablet by mouth daily. Patient not taking: Reported on 10/21/2014 08/03/14   Lavonia Drafts, MD  ondansetron (ZOFRAN) 4 MG tablet Take  1 tablet (4 mg total) by mouth every 8 (eight) hours as needed for nausea or vomiting. 10/21/14   Carrie Mew, PA-C  oxyCODONE-acetaminophen (PERCOCET/ROXICET) 5-325 MG per tablet Take 1-2 tablets by mouth every 6 (six) hours as needed for moderate pain or severe pain. 10/21/14   Carrie Mew, PA-C  polyethylene glycol powder (GLYCOLAX/MIRALAX) powder Take 17 g by mouth 3 (three) times daily. Until daily soft stools  OTC Patient not taking: Reported on 09/22/2014 01/07/14   Antonietta Breach, PA-C  traMADol (ULTRAM) 50 MG tablet Take 1 tablet (50 mg total) by mouth every 6 (six) hours as needed. Patient not taking: Reported on 09/22/2014 08/03/14   Lavonia Drafts, MD   BP 110/68 mmHg  Pulse 78  Temp(Src) 98 F (36.7 C) (Oral)  Resp 16  Ht 5\' 6"  (1.676 m)  Wt 195 lb (88.451 kg)  BMI 31.49 kg/m2  SpO2 99%  LMP 10/04/2014 (Exact Date) Physical Exam  Constitutional: She is oriented to person, place, and time. She appears well-developed and well-nourished. No distress.  HENT:  Head: Normocephalic and atraumatic.  Mouth/Throat: Oropharynx is clear and moist. No oropharyngeal exudate.  Eyes: Right eye exhibits no discharge. Left eye exhibits no discharge. No scleral icterus.  Neck: Normal range of motion.  Cardiovascular: Normal rate, regular rhythm and normal heart sounds.   No murmur heard. Pulmonary/Chest: Effort normal and breath sounds normal. No respiratory distress.  Abdominal: Soft. Normal appearance and bowel sounds are normal. There is generalized tenderness. There is no rigidity, no guarding, no tenderness at McBurney's point and negative Murphy's sign.  Genitourinary: Rectal exam shows external hemorrhoid. Rectal exam shows no internal hemorrhoid, no fissure, no mass, no tenderness and anal tone normal. Guaiac positive stool. There is no rash, tenderness, lesion or injury on the right labia. There is no rash, tenderness, lesion or injury on the left labia. Cervix exhibits no  motion tenderness, no discharge and no friability. Right adnexum displays tenderness. Right adnexum displays no mass and no fullness. Left adnexum displays tenderness. Left adnexum displays no mass and no fullness. There is bleeding in the vagina. No erythema or tenderness in the vagina. No foreign body around the vagina. No signs of injury around the vagina. No vaginal discharge found.  Bilateral adnexal tenderness noted, no cervical motion tenderness, friability or discharge. Moderate amount of blood noted in vaginal vault. Chaperone present during entire pelvic exam.  Musculoskeletal: Normal range of motion. She exhibits no edema or tenderness.  Neurological: She is alert and oriented to person, place, and time. No cranial nerve deficit. Coordination normal.  Skin: Skin is warm and dry. No rash noted. She is not diaphoretic.  Psychiatric: She has a normal mood and affect.  Nursing note and vitals reviewed.   ED Course  Procedures (including critical care time) Labs Review Labs Reviewed  CBC WITH DIFFERENTIAL - Abnormal; Notable for the following:    Hemoglobin 11.7 (*)    All other components within normal limits  COMPREHENSIVE METABOLIC PANEL - Abnormal; Notable for the following:    Sodium 134 (*)  Glucose, Bld 132 (*)    Anion gap 3 (*)    All other components within normal limits  URINALYSIS, ROUTINE W REFLEX MICROSCOPIC - Abnormal; Notable for the following:    Color, Urine RED (*)    APPearance CLOUDY (*)    Specific Gravity, Urine 1.039 (*)    Hgb urine dipstick LARGE (*)    Bilirubin Urine SMALL (*)    Protein, ur 100 (*)    Leukocytes, UA SMALL (*)    All other components within normal limits  URINE MICROSCOPIC-ADD ON - Abnormal; Notable for the following:    Squamous Epithelial / LPF MANY (*)    Bacteria, UA FEW (*)    All other components within normal limits  POC OCCULT BLOOD, ED - Abnormal; Notable for the following:    Fecal Occult Bld POSITIVE (*)    All other  components within normal limits  WET PREP, GENITAL  GC/CHLAMYDIA PROBE AMP  LIPASE, BLOOD  PREGNANCY, URINE  HIV ANTIBODY (ROUTINE TESTING)    Imaging Review US Transvaginal Non-ob  10/21/2014   CLINICAL DATA:  Adnexal tenderness.  Constant pelvic pain.  EXAM: TRANSABDOMINAL AND TRANSVAGINAL ULTRASOUND OF PELVIS  DOPPLER ULTRASOUND OF OVARIES  TECHNIQUE: Both transabdominal and transvaginal ultrasound examinations of the pelvis were performed. Transabdominal technique was performed for global imaging of the pelvis including uterus, ovaries, adnexal regions, and pelvic cul-de-sac.  It was necessary to proceed with endovaginal exam following the transabdominal exam to visualize the ovaries. Color and duplex Doppler ultrasound was utilized to evaluate blood flow to the ovaries.  COMPARISON:  CT 09/22/2014  FINDINGS: Uterus  Measurements: 12.2 x 4.0 x 5.4 cm. No fibroids or other mass visualized.  Endometrium  Thickness: 6 mm in thickness.  No focal abnormality visualized.  Right ovary  Measurements: 2.2 x 2.5 x 2.3 cm. Normal appearance/no adnexal mass. Multiple small follicles. Previously seen probable hemorrhagic cyst by CT has resolved.  Left ovary  Measurements: 3.3 x 2.2 x 2.0 cm. Normal appearance/no adnexal mass.  Pulsed Doppler evaluation of both ovaries demonstrates normal low-resistance arterial and venous waveforms.  Other findings  No free fluid.  IMPRESSION: Unremarkable study.   Electronically Signed   By: Rolm Baptise M.D.   On: 10/21/2014 10:28   US Pelvis Complete  10/21/2014   CLINICAL DATA:  Adnexal tenderness.  Constant pelvic pain.  EXAM: TRANSABDOMINAL AND TRANSVAGINAL ULTRASOUND OF PELVIS  DOPPLER ULTRASOUND OF OVARIES  TECHNIQUE: Both transabdominal and transvaginal ultrasound examinations of the pelvis were performed. Transabdominal technique was performed for global imaging of the pelvis including uterus, ovaries, adnexal regions, and pelvic cul-de-sac.  It was necessary to  proceed with endovaginal exam following the transabdominal exam to visualize the ovaries. Color and duplex Doppler ultrasound was utilized to evaluate blood flow to the ovaries.  COMPARISON:  CT 09/22/2014  FINDINGS: Uterus  Measurements: 12.2 x 4.0 x 5.4 cm. No fibroids or other mass visualized.  Endometrium  Thickness: 6 mm in thickness.  No focal abnormality visualized.  Right ovary  Measurements: 2.2 x 2.5 x 2.3 cm. Normal appearance/no adnexal mass. Multiple small follicles. Previously seen probable hemorrhagic cyst by CT has resolved.  Left ovary  Measurements: 3.3 x 2.2 x 2.0 cm. Normal appearance/no adnexal mass.  Pulsed Doppler evaluation of both ovaries demonstrates normal low-resistance arterial and venous waveforms.  Other findings  No free fluid.  IMPRESSION: Unremarkable study.   Electronically Signed   By: Rolm Baptise M.D.   On: 10/21/2014 10:28  [place] Art/ven Flow Abd Pelv Doppler  10/21/2014   CLINICAL DATA:  Adnexal tenderness.  Constant pelvic pain.  EXAM: TRANSABDOMINAL AND TRANSVAGINAL ULTRASOUND OF PELVIS  DOPPLER ULTRASOUND OF OVARIES  TECHNIQUE: Both transabdominal and transvaginal ultrasound examinations of the pelvis were performed. Transabdominal technique was performed for global imaging of the pelvis including uterus, ovaries, adnexal regions, and pelvic cul-de-sac.  It was necessary to proceed with endovaginal exam following the transabdominal exam to visualize the ovaries. Color and duplex Doppler ultrasound was utilized to evaluate blood flow to the ovaries.  COMPARISON:  CT 09/22/2014  FINDINGS: Uterus  Measurements: 12.2 x 4.0 x 5.4 cm. No fibroids or other mass visualized.  Endometrium  Thickness: 6 mm in thickness.  No focal abnormality visualized.  Right ovary  Measurements: 2.2 x 2.5 x 2.3 cm. Normal appearance/no adnexal mass. Multiple small follicles. Previously seen probable hemorrhagic cyst by CT has resolved.  Left ovary  Measurements: 3.3 x 2.2 x 2.0 cm. Normal  appearance/no adnexal mass.  Pulsed Doppler evaluation of both ovaries demonstrates normal low-resistance arterial and venous waveforms.  Other findings  No free fluid.  IMPRESSION: Unremarkable study.   Electronically Signed   By: Rolm Baptise M.D.   On: 10/21/2014 10:28     EKG Interpretation None      MDM   Final diagnoses:  Adnexal tenderness  Generalized abdominal pain  Diarrhea    Patient here with generalized abdominal pain, complaining of rectal bleeding, vaginal bleeding, diarrhea. Patient has had similar episodes of these complaints in the past with multiple visits to our ED, and in the ED at [place].  Patient has been given referrals to gastroenterology, without any follow-up. Today on exam patient's abdominal pain is generalized, there is mild tenderness on exam diffusely throughout her abdomen without any point tenderness. No evidence of surgical abdomen noted after multiple examinations. Lab work unremarkable for any leukocytosis, renal function, hepatic dysfunction, electrolyte imbalance that would suggest acute pathology. Patient underwent CT abdomen pelvis for similar complaints 30 days ago which was remarkable for a mesenteric adenitis.  I do not believe further imaging on her abdomen is warranted at this time. No frank blood noted on rectal exam, fecal occult blood was positive, however this thought to be due to either an external hemorrhoid or patient's vaginal bleeding which most likely contaminated the source as well. Vaginal bleeding noted with moderate amount of blood in vaginal vault, patient has hemoglobin of 11.7, which is identical to previous results throughout the year at different facilities. This appears to be baseline for her, and vaginal bleeding does not appear to be placing patient had an anemic level.  US pelvis with transvaginal non-OB unremarkable for any acute pathology.   Urinalysis remarkable for large hematuria, however this is also thought to  be due to her vaginal bleeding. After symptomatically therapy, patient is comfortable, resting comfortably on stretcher and in no acute distress. Patient is nontoxic, nonseptic appearing.  Patient's pain and other symptoms adequately managed in emergency department.  Fluid bolus given.  Labs, imaging and vitals reviewed.  Patient does not meet the SIRS or Sepsis criteria.  On repeat exam patient does not have a surgical abdomin and there are no peritoneal signs.  No indication of appendicitis, bowel obstruction, bowel perforation, cholecystitis, diverticulitis, PID or ectopic pregnancy.  Patient discharged home with symptomatic treatment and given strict instructions for follow-up with gastroenterology for her recurring abdominal pain, and follow-up with OB/GYN at [facility] clinic for her vaginal  bleeding. I discussed return precautions with patient, encouraged her to call or return to ER should she have any questions or concerns.  BP 110/68 mmHg  Pulse 78  Temp(Src) 98 F (36.7 C) (Oral)  Resp 16  Ht 5\' 6"  (1.676 m)  Wt 195 lb (88.451 kg)  BMI 31.49 kg/m2  SpO2 99%  LMP 10/04/2014 (Exact Date)  Signed,  Dahlia Bailiff, PA-C 5:43 PM  Patient seen and discussed with Dr. Veatrice Kells, MD.       Carrie Mew, PA-C 10/21/14 1744  Carlisle Beers, MD 10/24/14 [phone]

## 2014-10-22 LAB — GC/CHLAMYDIA PROBE AMP
CT PROBE, AMP APTIMA: NEGATIVE
GC PROBE AMP APTIMA: NEGATIVE

## 2014-12-03 ENCOUNTER — Encounter (HOSPITAL_BASED_OUTPATIENT_CLINIC_OR_DEPARTMENT_OTHER): Payer: Self-pay | Admitting: Emergency Medicine

## 2014-12-03 ENCOUNTER — Emergency Department (HOSPITAL_BASED_OUTPATIENT_CLINIC_OR_DEPARTMENT_OTHER)
Admission: EM | Admit: 2014-12-03 | Discharge: 2014-12-03 | Disposition: A | Discharge status: Home / Self Care | Payer: Medicaid Other | Attending: Emergency Medicine | Admitting: Emergency Medicine

## 2014-12-03 DIAGNOSIS — K589 Irritable bowel syndrome without diarrhea: Secondary | ICD-10-CM | POA: Diagnosis not present

## 2014-12-03 DIAGNOSIS — Z79899 Other long term (current) drug therapy: Secondary | ICD-10-CM | POA: Insufficient documentation

## 2014-12-03 DIAGNOSIS — Z9049 Acquired absence of other specified parts of digestive tract: Secondary | ICD-10-CM | POA: Insufficient documentation

## 2014-12-03 DIAGNOSIS — Z9889 Other specified postprocedural states: Secondary | ICD-10-CM | POA: Insufficient documentation

## 2014-12-03 DIAGNOSIS — R1030 Lower abdominal pain, unspecified: Secondary | ICD-10-CM

## 2014-12-03 DIAGNOSIS — R112 Nausea with vomiting, unspecified: Secondary | ICD-10-CM | POA: Diagnosis not present

## 2014-12-03 DIAGNOSIS — R197 Diarrhea, unspecified: Secondary | ICD-10-CM | POA: Diagnosis not present

## 2014-12-03 DIAGNOSIS — Z8632 Personal history of gestational diabetes: Secondary | ICD-10-CM | POA: Diagnosis not present

## 2014-12-03 DIAGNOSIS — Z8659 Personal history of other mental and behavioral disorders: Secondary | ICD-10-CM | POA: Insufficient documentation

## 2014-12-03 DIAGNOSIS — Z8619 Personal history of other infectious and parasitic diseases: Secondary | ICD-10-CM | POA: Insufficient documentation

## 2014-12-03 DIAGNOSIS — Z792 Long term (current) use of antibiotics: Secondary | ICD-10-CM | POA: Diagnosis not present

## 2014-12-03 DIAGNOSIS — Z8742 Personal history of other diseases of the female genital tract: Secondary | ICD-10-CM | POA: Insufficient documentation

## 2014-12-03 DIAGNOSIS — R1031 Right lower quadrant pain: Secondary | ICD-10-CM | POA: Diagnosis present

## 2014-12-03 DIAGNOSIS — Z862 Personal history of diseases of the blood and blood-forming organs and certain disorders involving the immune mechanism: Secondary | ICD-10-CM | POA: Diagnosis not present

## 2014-12-03 DIAGNOSIS — Z720 Tobacco use: Secondary | ICD-10-CM | POA: Diagnosis not present

## 2014-12-03 DIAGNOSIS — Z3202 Encounter for pregnancy test, result negative: Secondary | ICD-10-CM | POA: Insufficient documentation

## 2014-12-03 LAB — CBC WITH DIFFERENTIAL/PLATELET
BASOS PCT: 0 % (ref 0–1)
Basophils Absolute: 0 10*3/uL (ref 0.0–0.1)
Eosinophils Absolute: 0.1 10*3/uL (ref 0.0–0.7)
Eosinophils Relative: 1 % (ref 0–5)
HEMATOCRIT: 37.4 % (ref 36.0–46.0)
HEMOGLOBIN: 11.9 g/dL — AB (ref 12.0–15.0)
LYMPHS ABS: 1.1 10*3/uL (ref 0.7–4.0)
LYMPHS PCT: 25 % (ref 12–46)
MCH: 26 pg (ref 26.0–34.0)
MCHC: 31.8 g/dL (ref 30.0–36.0)
MCV: 81.7 fL (ref 78.0–100.0)
MONOS PCT: 9 % (ref 3–12)
Monocytes Absolute: 0.4 10*3/uL (ref 0.1–1.0)
Neutro Abs: 2.9 10*3/uL (ref 1.7–7.7)
Neutrophils Relative %: 65 % (ref 43–77)
Platelets: 242 10*3/uL (ref 150–400)
RBC: 4.58 MIL/uL (ref 3.87–5.11)
RDW: 14.7 % (ref 11.5–15.5)
WBC: 4.5 10*3/uL (ref 4.0–10.5)

## 2014-12-03 LAB — COMPREHENSIVE METABOLIC PANEL
ALBUMIN: 3.8 g/dL (ref 3.5–5.2)
ALK PHOS: 65 U/L (ref 39–117)
ALT: 16 U/L (ref 0–35)
AST: 17 U/L (ref 0–37)
Anion gap: 3 — ABNORMAL LOW (ref 5–15)
BUN: 14 mg/dL (ref 6–23)
CHLORIDE: 105 mmol/L (ref 96–112)
CO2: 25 mmol/L (ref 19–32)
Calcium: 8.7 mg/dL (ref 8.4–10.5)
Creatinine, Ser: 0.87 mg/dL (ref 0.50–1.10)
GFR calc non Af Amer: 89 mL/min — ABNORMAL LOW (ref >90)
Glucose, Bld: 113 mg/dL — ABNORMAL HIGH (ref 70–99)
Potassium: 3.5 mmol/L (ref 3.5–5.1)
Sodium: 133 mmol/L — ABNORMAL LOW (ref 135–145)
TOTAL PROTEIN: 7.7 g/dL (ref 6.0–8.3)
Total Bilirubin: 0.6 mg/dL (ref 0.3–1.2)

## 2014-12-03 LAB — URINALYSIS, ROUTINE W REFLEX MICROSCOPIC
Bilirubin Urine: NEGATIVE
GLUCOSE, UA: NEGATIVE mg/dL
Hgb urine dipstick: NEGATIVE
KETONES UR: NEGATIVE mg/dL
Leukocytes, UA: NEGATIVE
Nitrite: NEGATIVE
PROTEIN: NEGATIVE mg/dL
Specific Gravity, Urine: 1.031 — ABNORMAL HIGH (ref 1.005–1.030)
UROBILINOGEN UA: 1 mg/dL (ref 0.0–1.0)
pH: 5.5 (ref 5.0–8.0)

## 2014-12-03 LAB — WET PREP, GENITAL
Trich, Wet Prep: NONE SEEN
Yeast Wet Prep HPF POC: NONE SEEN

## 2014-12-03 LAB — LIPASE, BLOOD: Lipase: 31 U/L (ref 11–59)

## 2014-12-03 LAB — PREGNANCY, URINE: Preg Test, Ur: NEGATIVE

## 2014-12-03 MED ORDER — DICYCLOMINE HCL 20 MG PO TABS: 20.0000 mg | ORAL_TABLET | Freq: Two times a day (BID) | ORAL | Status: DC

## 2014-12-03 MED ORDER — HYDROMORPHONE HCL 1 MG/ML IJ SOLN
1.0000 mg | INTRAMUSCULAR | Status: AC
  Administered 2014-12-03: 1 mg via INTRAVENOUS
  Filled 2014-12-03: qty 1

## 2014-12-03 MED ORDER — OXYCODONE-ACETAMINOPHEN 5-325 MG PO TABS: 1.0000 | ORAL_TABLET | Freq: Four times a day (QID) | ORAL | Status: DC | PRN

## 2014-12-03 MED ORDER — DICYCLOMINE HCL 10 MG PO CAPS
10.0000 mg | ORAL_CAPSULE | Freq: Once | ORAL | Status: AC
  Administered 2014-12-03: 10 mg via ORAL
  Filled 2014-12-03: qty 1

## 2014-12-03 MED ORDER — SODIUM CHLORIDE 0.9 % IV BOLUS (SEPSIS)
1000.0000 mL | INTRAVENOUS | Status: AC
  Administered 2014-12-03: 1000 mL via INTRAVENOUS

## 2014-12-03 NOTE — Discharge Instructions (Signed)
Abdominal Pain, Women °Abdominal (stomach, pelvic, or belly) pain can be caused by many things. It is important to tell your doctor: °· The location of the pain. °· Does it come and go or is it present all the time? °· Are there things that start the pain (eating certain foods, exercise)? °· Are there other symptoms associated with the pain (fever, nausea, vomiting, diarrhea)? °All of this is helpful to know when trying to find the cause of the pain. °CAUSES  °· Stomach: virus or bacteria infection, or ulcer. °· Intestine: appendicitis (inflamed appendix), regional ileitis (Crohn's disease), ulcerative colitis (inflamed colon), irritable bowel syndrome, diverticulitis (inflamed diverticulum of the colon), or cancer of the stomach or intestine. °· Gallbladder disease or stones in the gallbladder. °· Kidney disease, kidney stones, or infection. °· Pancreas infection or cancer. °· Fibromyalgia (pain disorder). °· Diseases of the female organs: °¨ Uterus: fibroid (non-cancerous) tumors or infection. °¨ Fallopian tubes: infection or tubal pregnancy. °¨ Ovary: cysts or tumors. °¨ Pelvic adhesions (scar tissue). °¨ Endometriosis (uterus lining tissue growing in the pelvis and on the pelvic organs). °¨ Pelvic congestion syndrome (female organs filling up with blood just before the menstrual period). °¨ Pain with the menstrual period. °¨ Pain with ovulation (producing an egg). °¨ Pain with an IUD (intrauterine device, birth control) in the uterus. °¨ Cancer of the female organs. °· Functional pain (pain not caused by a disease, may improve without treatment). °· Psychological pain. °· Depression. °DIAGNOSIS  °Your doctor will decide the seriousness of your pain by doing an examination. °· Blood tests. °· X-rays. °· Ultrasound. °· CT scan (computed tomography, special type of X-ray). °· MRI (magnetic resonance imaging). °· Cultures, for infection. °· Barium enema (dye inserted in the large intestine, to better view it with  X-rays). °· Colonoscopy (looking in intestine with a lighted tube). °· Laparoscopy (minor surgery, looking in abdomen with a lighted tube). °· Major abdominal exploratory surgery (looking in abdomen with a large incision). °TREATMENT  °The treatment will depend on the cause of the pain.  °· Many cases can be observed and treated at home. °· Over-the-counter medicines recommended by your caregiver. °· Prescription medicine. °· Antibiotics, for infection. °· Birth control pills, for painful periods or for ovulation pain. °· Hormone treatment, for endometriosis. °· Nerve blocking injections. °· Physical therapy. °· Antidepressants. °· Counseling with a psychologist or psychiatrist. °· Minor or major surgery. °HOME CARE INSTRUCTIONS  °· Do not take laxatives, unless directed by your caregiver. °· Take over-the-counter pain medicine only if ordered by your caregiver. Do not take aspirin because it can cause an upset stomach or bleeding. °· Try a clear liquid diet (broth or water) as ordered by your caregiver. Slowly move to a bland diet, as tolerated, if the pain is related to the stomach or intestine. °· Have a thermometer and take your temperature several times a day, and record it. °· Bed rest and sleep, if it helps the pain. °· Avoid sexual intercourse, if it causes pain. °· Avoid stressful situations. °· Keep your follow-up appointments and tests, as your caregiver orders. °· If the pain does not go away with medicine or surgery, you may try: °¨ Acupuncture. °¨ Relaxation exercises (yoga, meditation). °¨ Group therapy. °¨ Counseling. °SEEK MEDICAL CARE IF:  °· You notice certain foods cause stomach pain. °· Your home care treatment is not helping your pain. °· You need stronger pain medicine. °· You want your IUD removed. °· You feel faint or   lightheaded. °· You develop nausea and vomiting. °· You develop a rash. °· You are having side effects or an allergy to your medicine. °SEEK IMMEDIATE MEDICAL CARE IF:  °· Your  pain does not go away or gets worse. °· You have a fever. °· Your pain is felt only in portions of the abdomen. The right side could possibly be appendicitis. The left lower portion of the abdomen could be colitis or diverticulitis. °· You are passing blood in your stools (bright red or black tarry stools, with or without vomiting). °· You have blood in your urine. °· You develop chills, with or without a fever. °· You pass out. °MAKE SURE YOU:  °· Understand these instructions. °· Will watch your condition. °· Will get help right away if you are not doing well or get worse. °Document Released: 08/06/2007 Document Revised: 02/23/2014 Document Reviewed: 08/26/2009 °[employer]® Patient Information ©2015 [employer]. This information is not intended to replace advice given to you by your health care provider. Make sure you discuss any questions you have with your health care provider. ° °

## 2014-12-03 NOTE — ED Provider Notes (Signed)
CSN: [id number]     Arrival date & time 12/03/14  6761 History   First MD Initiated Contact with Patient 12/03/14 [phone]     Chief Complaint  Patient presents with  . Abdominal Pain     (Consider location/radiation/quality/duration/timing/severity/associated sxs/prior Treatment) Patient is a 29 y.o. female presenting with abdominal pain. The history is provided by the patient.  Abdominal Pain Pain location:  RLQ, LLQ and suprapubic Pain quality: aching   Pain radiates to:  Does not radiate Pain severity:  Moderate Onset quality:  Gradual Duration:  6 days Timing:  Constant Progression:  Waxing and waning Chronicity:  Chronic Context comment:  At rest Relieved by:  Nothing Worsened by:  Nothing tried Ineffective treatments:  None tried Associated symptoms: diarrhea, nausea and vomiting   Associated symptoms: no chest pain, no cough, no dysuria, no fatigue, no fever, no hematuria and no shortness of breath     Past Medical History  Diagnosis Date  . Ovarian cyst   . Peritonitis, acute generalized   . Sepsis   . Anxiety   . Anemia   . Gestational diabetes   . GERD (gastroesophageal reflux disease)     has resolved  . IBS (irritable bowel syndrome)    Past Surgical History  Procedure Laterality Date  . Cesarean section    . Ovarian cyst drainage    . Appendectomy      ruptured    No family history on file. History  Substance Use Topics  . Smoking status: Current Every Day Smoker -- 0.25 packs/day    Types: Cigarettes  . Smokeless tobacco: Never Used  . Alcohol Use: Yes     Comment: occassional   OB History    Gravida Para Term Preterm AB TAB SAB Ectopic Multiple Living   2 2 2       2      Review of Systems  Constitutional: Negative for fever and fatigue.  HENT: Negative for congestion and drooling.   Eyes: Negative for pain.  Respiratory: Negative for cough and shortness of breath.   Cardiovascular: Negative for chest pain.  Gastrointestinal: Positive for  nausea, vomiting, abdominal pain and diarrhea.  Genitourinary: Negative for dysuria and hematuria.  Musculoskeletal: Negative for back pain, gait problem and neck pain.  Skin: Negative for color change.  Neurological: Negative for dizziness and headaches.  Hematological: Negative for adenopathy.  Psychiatric/Behavioral: Negative for behavioral problems.  All other systems reviewed and are negative.     Allergies  Contrast media  Home Medications   Prior to Admission medications   Medication Sig Start Date End Date Taking? Authorizing Provider  albuterol (PROVENTIL HFA;VENTOLIN HFA) 108 (90 BASE) MCG/ACT inhaler Inhale 1-2 puffs into the lungs every 6 (six) hours as needed for wheezing or shortness of breath. Patient not taking: Reported on 10/21/2014 11/17/13   Ruthell Rummage Dammen, PA-C  cephALEXin (KEFLEX) 500 MG capsule Take 1 capsule (500 mg total) by mouth 4 (four) times daily. Patient not taking: Reported on 09/22/2014 07/28/14   Nehemiah Settle A Upstill, PA-C  ciprofloxacin (CIPRO) 500 MG tablet Take 1 tablet (500 mg total) by mouth 2 (two) times daily. Patient not taking: Reported on 10/21/2014 32/6/71   Delora Fuel, MD  dicyclomine (BENTYL) 20 MG tablet Take 1 tablet (20 mg total) by mouth 4 (four) times daily -  before meals and at bedtime. 10/18/13 10/22/15  Manya Silvas, CNM  norgestimate-ethinyl estradiol (ORTHO-CYCLEN,SPRINTEC,PREVIFEM) 0.25-35 MG-MCG tablet Take 1 tablet by mouth daily. Patient not taking: Reported  on 10/21/2014 08/03/14   Lavonia Drafts, MD  ondansetron (ZOFRAN) 4 MG tablet Take 1 tablet (4 mg total) by mouth every 8 (eight) hours as needed for nausea or vomiting. 10/21/14   Carrie Mew, PA-C  oxyCODONE-acetaminophen (PERCOCET/ROXICET) 5-325 MG per tablet Take 1-2 tablets by mouth every 6 (six) hours as needed for moderate pain or severe pain. 10/21/14   Carrie Mew, PA-C  polyethylene glycol powder (GLYCOLAX/MIRALAX) powder Take 17 g by mouth 3 (three)  times daily. Until daily soft stools  OTC Patient not taking: Reported on 09/22/2014 01/07/14   Antonietta Breach, PA-C  traMADol (ULTRAM) 50 MG tablet Take 1 tablet (50 mg total) by mouth every 6 (six) hours as needed. Patient not taking: Reported on 09/22/2014 08/03/14   Lavonia Drafts, MD   BP 107/77 mmHg  Pulse 94  Temp(Src) 98.8 F (37.1 C) (Oral)  Resp 18  Ht 5\' 7"  (1.702 m)  Wt 229 lb (103.874 kg)  BMI 35.86 kg/m2  SpO2 97%  LMP 11/06/2014 (Exact Date) Physical Exam  Constitutional: She is oriented to person, place, and time. She appears well-developed and well-nourished.  HENT:  Head: Normocephalic.  Mouth/Throat: Oropharynx is clear and moist. No oropharyngeal exudate.  Eyes: Conjunctivae and EOM are normal. Pupils are equal, round, and reactive to light.  Neck: Normal range of motion. Neck supple.  Cardiovascular: Normal rate, regular rhythm, normal heart sounds and intact distal pulses.  Exam reveals no gallop and no friction rub.   No murmur heard. Pulmonary/Chest: Effort normal and breath sounds normal. No respiratory distress. She has no wheezes.  Abdominal: Soft. Bowel sounds are normal. There is tenderness. There is no rebound and no guarding.  Nonspecific lower abdominal Tenderness to palpation.  Genitourinary:  Normal appearance of external vagina. Normal-appearing cervix. Os closed.  Small amount of white milky fluid in the posterior fornix. No cervical motion tenderness. Diffuse mild bimanual tenderness which does not localize.  Musculoskeletal: Normal range of motion. She exhibits no edema or tenderness.  Neurological: She is alert and oriented to person, place, and time.  Skin: Skin is warm and dry.  Psychiatric: She has a normal mood and affect. Her behavior is normal.  Nursing note and vitals reviewed.   ED Course  Procedures (including critical care time) Labs Review Labs Reviewed  WET PREP, GENITAL - Abnormal; Notable for the following:    Clue  Cells Wet Prep HPF POC FEW (*)    WBC, Wet Prep HPF POC TOO NUMEROUS TO COUNT (*)    All other components within normal limits  URINALYSIS, ROUTINE W REFLEX MICROSCOPIC - Abnormal; Notable for the following:    Specific Gravity, Urine 1.031 (*)    All other components within normal limits  CBC WITH DIFFERENTIAL/PLATELET - Abnormal; Notable for the following:    Hemoglobin 11.9 (*)    All other components within normal limits  COMPREHENSIVE METABOLIC PANEL - Abnormal; Notable for the following:    Sodium 133 (*)    Glucose, Bld 113 (*)    GFR calc non Af Amer 89 (*)    Anion gap 3 (*)    All other components within normal limits  URINE CULTURE  PREGNANCY, URINE  LIPASE, BLOOD  GC/CHLAMYDIA PROBE AMP ()    Imaging Review No results found.   EKG Interpretation None      MDM   Final diagnoses:  Lower abdominal pain    9:04 AM 29 y.o. female w hx of ovarian cysts, irritable  bowel syndrome who presents with lower abdominal pain which began about 6 days ago. She denies any fevers. She has intermittent loose stools at baseline which is not significantly changed. Occasional emesis. Pain similar to pain she has had previously with irritable bowel syndrome. It is nonfocal and diffuse in the lower abdomen. She was seen 8 times last year for lower abdominal pain. She states that she has not been able to follow-up with GI or get a primary care doctor.she has had multiple pelvic ultrasounds and a CT scan 2 months ago. Her vital signs are unremarkable here. Do not think repeat imaging will be useful at this time. Will get symptomatic control and repeat lab work and pelvic exam.  10:52 AM: I interpreted/reviewed the labs and/or imaging which were non-contributory.  The patient continues to appear well. Given multiple previous workups in the past I don't think that repeat imaging is necessary. Her symptoms are likely related to irritable bowel syndrome. Will recommend dental and pain  control. Also recommend follow-up with GI. I have discussed the diagnosis/risks/treatment options with the patient and believe the pt to be eligible for discharge home to follow-up with GI. We also discussed returning to the ED immediately if new or worsening sx occur. We discussed the sx which are most concerning (e.g., worsening pain, fever) that necessitate immediate return. Medications administered to the patient during their visit and any new prescriptions provided to the patient are listed below.  Medications given during this visit Medications  sodium chloride 0.9 % bolus 1,000 mL (1,000 mLs Intravenous New Bag/Given 12/03/14 0923)  HYDROmorphone (DILAUDID) injection 1 mg (1 mg Intravenous Given 12/03/14 0921)  dicyclomine (BENTYL) capsule 10 mg (10 mg Oral Given 12/03/14 0921)    New Prescriptions   DICYCLOMINE (BENTYL) 20 MG TABLET    Take 1 tablet (20 mg total) by mouth 2 (two) times daily.   OXYCODONE-ACETAMINOPHEN (PERCOCET) 5-325 MG PER TABLET    Take 1 tablet by mouth every 6 (six) hours as needed.     Pamella Pert, MD 12/03/14 1053

## 2014-12-03 NOTE — ED Notes (Signed)
MD at bedside. 

## 2014-12-03 NOTE — ED Notes (Signed)
29 yo with abdominal pain since Saturday with N/V/D. Hx of IBS.

## 2014-12-04 LAB — GC/CHLAMYDIA PROBE AMP (~~LOC~~) NOT AT ARMC
CHLAMYDIA, DNA PROBE: NEGATIVE
Neisseria Gonorrhea: NEGATIVE

## 2014-12-04 LAB — URINE CULTURE
COLONY COUNT: NO GROWTH
Culture: NO GROWTH

## 2014-12-09 ENCOUNTER — Emergency Department (HOSPITAL_BASED_OUTPATIENT_CLINIC_OR_DEPARTMENT_OTHER)
Admission: EM | Admit: 2014-12-09 | Discharge: 2014-12-10 | Disposition: A | Discharge status: Home / Self Care | Payer: Medicaid Other | Attending: Emergency Medicine | Admitting: Emergency Medicine

## 2014-12-09 ENCOUNTER — Encounter (HOSPITAL_BASED_OUTPATIENT_CLINIC_OR_DEPARTMENT_OTHER): Payer: Self-pay | Admitting: *Deleted

## 2014-12-09 ENCOUNTER — Emergency Department (HOSPITAL_BASED_OUTPATIENT_CLINIC_OR_DEPARTMENT_OTHER): Payer: Medicaid Other

## 2014-12-09 DIAGNOSIS — Z8619 Personal history of other infectious and parasitic diseases: Secondary | ICD-10-CM | POA: Diagnosis not present

## 2014-12-09 DIAGNOSIS — Z79899 Other long term (current) drug therapy: Secondary | ICD-10-CM | POA: Insufficient documentation

## 2014-12-09 DIAGNOSIS — N939 Abnormal uterine and vaginal bleeding, unspecified: Secondary | ICD-10-CM | POA: Diagnosis not present

## 2014-12-09 DIAGNOSIS — R102 Pelvic and perineal pain: Secondary | ICD-10-CM

## 2014-12-09 DIAGNOSIS — Z792 Long term (current) use of antibiotics: Secondary | ICD-10-CM | POA: Insufficient documentation

## 2014-12-09 DIAGNOSIS — Z8719 Personal history of other diseases of the digestive system: Secondary | ICD-10-CM | POA: Diagnosis not present

## 2014-12-09 DIAGNOSIS — Z720 Tobacco use: Secondary | ICD-10-CM | POA: Diagnosis not present

## 2014-12-09 DIAGNOSIS — Z793 Long term (current) use of hormonal contraceptives: Secondary | ICD-10-CM | POA: Diagnosis not present

## 2014-12-09 DIAGNOSIS — F419 Anxiety disorder, unspecified: Secondary | ICD-10-CM | POA: Diagnosis not present

## 2014-12-09 DIAGNOSIS — M545 Low back pain: Secondary | ICD-10-CM | POA: Diagnosis not present

## 2014-12-09 DIAGNOSIS — Z8632 Personal history of gestational diabetes: Secondary | ICD-10-CM | POA: Insufficient documentation

## 2014-12-09 DIAGNOSIS — Z3202 Encounter for pregnancy test, result negative: Secondary | ICD-10-CM | POA: Diagnosis not present

## 2014-12-09 DIAGNOSIS — Z862 Personal history of diseases of the blood and blood-forming organs and certain disorders involving the immune mechanism: Secondary | ICD-10-CM | POA: Insufficient documentation

## 2014-12-09 DIAGNOSIS — N920 Excessive and frequent menstruation with regular cycle: Secondary | ICD-10-CM

## 2014-12-09 LAB — COMPREHENSIVE METABOLIC PANEL
ALBUMIN: 3.8 g/dL (ref 3.5–5.2)
ALT: 20 U/L (ref 0–35)
AST: 19 U/L (ref 0–37)
Alkaline Phosphatase: 64 U/L (ref 39–117)
Anion gap: 3 — ABNORMAL LOW (ref 5–15)
BUN: 15 mg/dL (ref 6–23)
CALCIUM: 9 mg/dL (ref 8.4–10.5)
CO2: 27 mmol/L (ref 19–32)
CREATININE: 0.67 mg/dL (ref 0.50–1.10)
Chloride: 107 mmol/L (ref 96–112)
GFR calc Af Amer: >90 mL/min (ref >90)
GFR calc non Af Amer: >90 mL/min (ref >90)
Glucose, Bld: 97 mg/dL (ref 70–99)
Potassium: 3.8 mmol/L (ref 3.5–5.1)
SODIUM: 137 mmol/L (ref 135–145)
Total Bilirubin: 0.4 mg/dL (ref 0.3–1.2)
Total Protein: 7.6 g/dL (ref 6.0–8.3)

## 2014-12-09 LAB — URINALYSIS, ROUTINE W REFLEX MICROSCOPIC
BILIRUBIN URINE: NEGATIVE
GLUCOSE, UA: NEGATIVE mg/dL
KETONES UR: NEGATIVE mg/dL
Nitrite: NEGATIVE
Protein, ur: 30 mg/dL — AB
Specific Gravity, Urine: 1.022 (ref 1.005–1.030)
Urobilinogen, UA: 1 mg/dL (ref 0.0–1.0)
pH: 8 (ref 5.0–8.0)

## 2014-12-09 LAB — CBC WITH DIFFERENTIAL/PLATELET
BASOS ABS: 0 10*3/uL (ref 0.0–0.1)
Basophils Relative: 0 % (ref 0–1)
Eosinophils Absolute: 0.1 10*3/uL (ref 0.0–0.7)
Eosinophils Relative: 1 % (ref 0–5)
HCT: 37.1 % (ref 36.0–46.0)
Hemoglobin: 11.7 g/dL — ABNORMAL LOW (ref 12.0–15.0)
LYMPHS PCT: 25 % (ref 12–46)
Lymphs Abs: 1.4 10*3/uL (ref 0.7–4.0)
MCH: 25.8 pg — ABNORMAL LOW (ref 26.0–34.0)
MCHC: 31.5 g/dL (ref 30.0–36.0)
MCV: 81.7 fL (ref 78.0–100.0)
Monocytes Absolute: 0.4 10*3/uL (ref 0.1–1.0)
Monocytes Relative: 7 % (ref 3–12)
NEUTROS ABS: 3.6 10*3/uL (ref 1.7–7.7)
Neutrophils Relative %: 67 % (ref 43–77)
Platelets: 267 10*3/uL (ref 150–400)
RBC: 4.54 MIL/uL (ref 3.87–5.11)
RDW: 14.8 % (ref 11.5–15.5)
WBC: 5.4 10*3/uL (ref 4.0–10.5)

## 2014-12-09 LAB — WET PREP, GENITAL
Trich, Wet Prep: NONE SEEN
WBC WET PREP: NONE SEEN
Yeast Wet Prep HPF POC: NONE SEEN

## 2014-12-09 LAB — URINE MICROSCOPIC-ADD ON

## 2014-12-09 LAB — LIPASE, BLOOD: Lipase: 28 U/L (ref 11–59)

## 2014-12-09 LAB — PREGNANCY, URINE: Preg Test, Ur: NEGATIVE

## 2014-12-09 MED ORDER — MORPHINE SULFATE 4 MG/ML IJ SOLN
4.0000 mg | Freq: Once | INTRAMUSCULAR | Status: AC
  Administered 2014-12-09: 4 mg via INTRAVENOUS
  Filled 2014-12-09: qty 1

## 2014-12-09 MED ORDER — SODIUM CHLORIDE 0.9 % IV BOLUS (SEPSIS)
1000.0000 mL | Freq: Once | INTRAVENOUS | Status: AC
  Administered 2014-12-09: 1000 mL via INTRAVENOUS

## 2014-12-09 NOTE — Discharge Instructions (Signed)
Please call your doctor for a followup appointment within 24-48 hours. When you talk to your doctor please let them know that you were seen in the emergency department and have them acquire all of your records so that they can discuss the findings with you and formulate a treatment plan to fully care for your new and ongoing problems. Please follow-up with your OB/GYN in [place] first thing tomorrow Please rest and stay hydrated Please avoid any physical strenuous activity Please continue to monitor symptoms closely and if symptoms are to worsen or change (fever greater than 101, chills, sweating, nausea, vomiting, chest pain, shortness of breathe, difficulty breathing, weakness, numbness, tingling, worsening or changes to pain pattern, fainting, blurred vision, sudden loss of vision) please report back to the Emergency Department immediately.    Abnormal Uterine Bleeding Abnormal uterine bleeding can affect women at various stages in life, including teenagers, women in their reproductive years, pregnant women, and women who have reached menopause. Several kinds of uterine bleeding are considered abnormal, including:  Bleeding or spotting between periods.   Bleeding after sexual intercourse.   Bleeding that is heavier or more than normal.   Periods that last longer than usual.  Bleeding after menopause.  Many cases of abnormal uterine bleeding are minor and simple to treat, while others are more serious. Any type of abnormal bleeding should be evaluated by your health care provider. Treatment will depend on the cause of the bleeding. HOME CARE INSTRUCTIONS Monitor your condition for any changes. The following actions may help to alleviate any discomfort you are experiencing:  Avoid the use of tampons and douches as directed by your health care provider.  Change your pads frequently. You should get regular pelvic exams and Pap tests. Keep all follow-up appointments for diagnostic  tests as directed by your health care provider.  SEEK MEDICAL CARE IF:   Your bleeding lasts more than 1 week.   You feel dizzy at times.  SEEK IMMEDIATE MEDICAL CARE IF:   You pass out.   You are changing pads every 15 to 30 minutes.   You have abdominal pain.  You have a fever.   You become sweaty or weak.   You are passing large blood clots from the vagina.   You start to feel nauseous and vomit. MAKE SURE YOU:   Understand these instructions.  Will watch your condition.  Will get help right away if you are not doing well or get worse. Document Released: 10/09/2005 Document Revised: 10/14/2013 Document Reviewed: 05/08/2013 [facility] Patient Information 2015 [place], [place]. This information is not intended to replace advice given to you by your health care provider. Make sure you discuss any questions you have with your health care provider.

## 2014-12-09 NOTE — ED Notes (Signed)
Pt sts she was seen here last week for pelvic pain and the pain continues. She sts her period started 2 days ago and she has since been having large blood clots.

## 2014-12-09 NOTE — ED Provider Notes (Signed)
CSN: [id number]     Arrival date & time 12/09/14  1700 History   First MD Initiated Contact with Patient 12/09/14 1752     Chief Complaint  Patient presents with  . Pelvic Pain     (Consider location/radiation/quality/duration/timing/severity/associated sxs/prior Treatment) The history is provided by the patient. No language interpreter was used.  Jodi Mcgee is a 29 y/o F with PMHx of ovarian cysts, peritonitis, anxiety, sepsis, IBS, GERD presenting to the ED with pelvis pani and vaginal bleeding. Patient reported that she started her period 2 days ago - reported to be normal. Stated that today she noticed that she was having cramping to the lower portion of her abdomen/pelvic region that has been constant and intense. Patient reported that the pain radiates to her lower back and stated that nothing makes the pain better or worse. Reported that she has been having heavy bleeding with blood clot passing - reported that she has been having to change her pad every 30 minutes to one hour. Reported that she has been using Percocets with minimal relief. Stated that she used to be on Sprintec, but ha not in a couple of months nor has been seen by a OBGYN in a while. Patient reported that she is moving back to [place] tomorrow. Denied history of heavy menstrual cycles. Denied dysuria, numbness, tingling, nausea, vomiting, melena, hematochezia, diarrhea, fainting, dizziness, blurred vision, sudden loss of vision, abdominal pain, chest pain, shortness of breath, difficulty breathing. PCP none  Past Medical History  Diagnosis Date  . Ovarian cyst   . Peritonitis, acute generalized   . Sepsis   . Anxiety   . Anemia   . Gestational diabetes   . GERD (gastroesophageal reflux disease)     has resolved  . IBS (irritable bowel syndrome)    Past Surgical History  Procedure Laterality Date  . Cesarean section    . Ovarian cyst drainage    . Appendectomy      ruptured    No family history on  file. History  Substance Use Topics  . Smoking status: Current Every Day Smoker -- 0.25 packs/day    Types: Cigarettes  . Smokeless tobacco: Never Used  . Alcohol Use: Yes     Comment: occassional   OB History    Gravida Para Term Preterm AB TAB SAB Ectopic Multiple Living   2 2 2       2      Review of Systems  Constitutional: Negative for fever and chills.  Eyes: Negative for visual disturbance.  Respiratory: Negative for chest tightness and shortness of breath.   Cardiovascular: Negative for chest pain.  Gastrointestinal: Negative for nausea, vomiting, abdominal pain, diarrhea, constipation, blood in stool and anal bleeding.  Genitourinary: Positive for vaginal discharge and pelvic pain. Negative for dysuria, vaginal bleeding and vaginal pain.  Musculoskeletal: Positive for back pain. Negative for neck pain and neck stiffness.  Neurological: Negative for dizziness, weakness and headaches.      Allergies  Contrast media  Home Medications   Prior to Admission medications   Medication Sig Start Date End Date Taking? Authorizing Provider  albuterol (PROVENTIL HFA;VENTOLIN HFA) 108 (90 BASE) MCG/ACT inhaler Inhale 1-2 puffs into the lungs every 6 (six) hours as needed for wheezing or shortness of breath. Patient not taking: Reported on 10/21/2014 11/17/13   Ruthell Rummage Dammen, PA-C  cephALEXin (KEFLEX) 500 MG capsule Take 1 capsule (500 mg total) by mouth 4 (four) times daily. Patient not taking: Reported on  09/22/2014 07/28/14   Shari A Upstill, PA-C  ciprofloxacin (CIPRO) 500 MG tablet Take 1 tablet (500 mg total) by mouth 2 (two) times daily. Patient not taking: Reported on 10/21/2014 00/8/67   Delora Fuel, MD  dicyclomine (BENTYL) 20 MG tablet Take 1 tablet (20 mg total) by mouth 2 (two) times daily. 12/03/14   Pamella Pert, MD  norgestimate-ethinyl estradiol (ORTHO-CYCLEN,SPRINTEC,PREVIFEM) 0.25-35 MG-MCG tablet Take 1 tablet by mouth daily. Patient not taking: Reported on  10/21/2014 08/03/14   Lavonia Drafts, MD  ondansetron (ZOFRAN) 4 MG tablet Take 1 tablet (4 mg total) by mouth every 8 (eight) hours as needed for nausea or vomiting. 10/21/14   Carrie Mew, PA-C  oxyCODONE-acetaminophen (PERCOCET) 5-325 MG per tablet Take 1 tablet by mouth every 6 (six) hours as needed. 12/03/14   Pamella Pert, MD  polyethylene glycol powder (GLYCOLAX/MIRALAX) powder Take 17 g by mouth 3 (three) times daily. Until daily soft stools  OTC Patient not taking: Reported on 09/22/2014 01/07/14   Antonietta Breach, PA-C  traMADol (ULTRAM) 50 MG tablet Take 1 tablet (50 mg total) by mouth every 6 (six) hours as needed. Patient not taking: Reported on 09/22/2014 08/03/14   Lavonia Drafts, MD   BP 117/71 mmHg  Pulse 86  Temp(Src) 98.8 F (37.1 C) (Oral)  Resp 18  Ht 5\' 7"  (1.702 m)  Wt 228 lb (103.42 kg)  BMI 35.70 kg/m2  SpO2 99%  LMP 12/07/2014 Physical Exam  Constitutional: She is oriented to person, place, and time. She appears well-developed and well-nourished.  HENT:  Head: Normocephalic and atraumatic.  Eyes: Conjunctivae and EOM are normal. Right eye exhibits no discharge. Left eye exhibits no discharge.  Neck: Normal range of motion. Neck supple.  Cardiovascular: Normal rate, regular rhythm and normal heart sounds.  Exam reveals no friction rub.   No murmur heard. Pulmonary/Chest: Effort normal and breath sounds normal. No respiratory distress. She has no wheezes. She has no rales.  Abdominal: Soft. Bowel sounds are normal. She exhibits no distension. There is no tenderness. There is no rebound and no guarding.  Genitourinary:  Pelvic exam: Negative swelling, erythema, inflammation, lesions, sores, deformities identified to the external region. Negative vaginal discharge noted. Bright red blood noted in the vaginal vault - negative blood clots noted. Cervix identified with negative friability or abnormalities. Positive CMT and adnexal tenderness  bilaterally. Exam chaperoned with nurse, Brandi  Musculoskeletal: Normal range of motion.  Neurological: She is alert and oriented to person, place, and time. No cranial nerve deficit. She exhibits normal muscle tone. Coordination normal.  Skin: Skin is warm and dry. No rash noted. No erythema.  Psychiatric: She has a normal mood and affect. Her behavior is normal. Thought content normal.  Nursing note and vitals reviewed.   ED Course  Procedures (including critical care time)  Results for orders placed or performed during the hospital encounter of 12/09/14  Wet prep, genital  Result Value Ref Range   Yeast Wet Prep HPF POC NONE SEEN NONE SEEN   Trich, Wet Prep NONE SEEN NONE SEEN   Clue Cells Wet Prep HPF POC FEW (A) NONE SEEN   WBC, Wet Prep HPF POC NONE SEEN NONE SEEN  Pregnancy, urine  Result Value Ref Range   Preg Test, Ur NEGATIVE NEGATIVE  Urinalysis, Routine w reflex microscopic  Result Value Ref Range   Color, Urine RED (A) YELLOW   APPearance CLOUDY (A) CLEAR   Specific Gravity, Urine 1.022 1.005 - 1.030   pH  8.0 5.0 - 8.0   Glucose, UA NEGATIVE NEGATIVE mg/dL   Hgb urine dipstick LARGE (A) NEGATIVE   Bilirubin Urine NEGATIVE NEGATIVE   Ketones, ur NEGATIVE NEGATIVE mg/dL   Protein, ur 30 (A) NEGATIVE mg/dL   Urobilinogen, UA 1.0 0.0 - 1.0 mg/dL   Nitrite NEGATIVE NEGATIVE   Leukocytes, UA SMALL (A) NEGATIVE  Urine microscopic-add on  Result Value Ref Range   Squamous Epithelial / LPF FEW (A) RARE   WBC, UA 3-6 <3 WBC/hpf   RBC / HPF TOO NUMEROUS TO COUNT <3 RBC/hpf   Bacteria, UA FEW (A) RARE  CBC with Differential/Platelet  Result Value Ref Range   WBC 5.4 4.0 - 10.5 K/uL   RBC 4.54 3.87 - 5.11 MIL/uL   Hemoglobin 11.7 (L) 12.0 - 15.0 g/dL   HCT 37.1 36.0 - 46.0 %   MCV 81.7 78.0 - 100.0 fL   MCH 25.8 (L) 26.0 - 34.0 pg   MCHC 31.5 30.0 - 36.0 g/dL   RDW 14.8 11.5 - 15.5 %   Platelets 267 150 - 400 K/uL   Neutrophils Relative % 67 43 - 77 %   Neutro  Abs 3.6 1.7 - 7.7 K/uL   Lymphocytes Relative 25 12 - 46 %   Lymphs Abs 1.4 0.7 - 4.0 K/uL   Monocytes Relative 7 3 - 12 %   Monocytes Absolute 0.4 0.1 - 1.0 K/uL   Eosinophils Relative 1 0 - 5 %   Eosinophils Absolute 0.1 0.0 - 0.7 K/uL   Basophils Relative 0 0 - 1 %   Basophils Absolute 0.0 0.0 - 0.1 K/uL  Comprehensive metabolic panel  Result Value Ref Range   Sodium 137 135 - 145 mmol/L   Potassium 3.8 3.5 - 5.1 mmol/L   Chloride 107 96 - 112 mmol/L   CO2 27 19 - 32 mmol/L   Glucose, Bld 97 70 - 99 mg/dL   BUN 15 6 - 23 mg/dL   Creatinine, Ser 0.67 0.50 - 1.10 mg/dL   Calcium 9.0 8.4 - 10.5 mg/dL   Total Protein 7.6 6.0 - 8.3 g/dL   Albumin 3.8 3.5 - 5.2 g/dL   AST 19 0 - 37 U/L   ALT 20 0 - 35 U/L   Alkaline Phosphatase 64 39 - 117 U/L   Total Bilirubin 0.4 0.3 - 1.2 mg/dL   GFR calc non Af Amer >90 >90 mL/min   GFR calc Af Amer >90 >90 mL/min   Anion gap 3 (L) 5 - 15  Lipase, blood  Result Value Ref Range   Lipase 28 11 - 59 U/L    Labs Review Labs Reviewed  WET PREP, GENITAL - Abnormal; Notable for the following:    Clue Cells Wet Prep HPF POC FEW (*)    All other components within normal limits  URINALYSIS, ROUTINE W REFLEX MICROSCOPIC - Abnormal; Notable for the following:    Color, Urine RED (*)    APPearance CLOUDY (*)    Hgb urine dipstick LARGE (*)    Protein, ur 30 (*)    Leukocytes, UA SMALL (*)    All other components within normal limits  URINE MICROSCOPIC-ADD ON - Abnormal; Notable for the following:    Squamous Epithelial / LPF FEW (*)    Bacteria, UA FEW (*)    All other components within normal limits  CBC WITH DIFFERENTIAL/PLATELET - Abnormal; Notable for the following:    Hemoglobin 11.7 (*)    MCH 25.8 (*)  All other components within normal limits  COMPREHENSIVE METABOLIC PANEL - Abnormal; Notable for the following:    Anion gap 3 (*)    All other components within normal limits  PREGNANCY, URINE  LIPASE, BLOOD  HIV ANTIBODY  (ROUTINE TESTING)  GC/CHLAMYDIA PROBE AMP ([place])    Imaging Review US Transvaginal Non-ob  12/09/2014   CLINICAL DATA:  Pelvic pain for 2 weeks with heavy vaginal bleeding for 2 days.  EXAM: TRANSABDOMINAL AND TRANSVAGINAL ULTRASOUND OF PELVIS  DOPPLER ULTRASOUND OF OVARIES  TECHNIQUE: Both transabdominal and transvaginal ultrasound examinations of the pelvis were performed. Transabdominal technique was performed for global imaging of the pelvis including uterus, ovaries, adnexal regions, and pelvic cul-de-sac.  It was necessary to proceed with endovaginal exam following the transabdominal exam to visualize the endometrium. Color and duplex Doppler ultrasound was utilized to evaluate blood flow to the ovaries.  COMPARISON:  Pelvic ultrasound 10/21/2014.  FINDINGS: Uterus  Measurements: 10.7 x 3.7 x 5.2 cm. No fibroids or other mass visualized.  Endometrium  Thickness: 10 mm.  No focal abnormality visualized.  Right ovary  Measurements: 4.1 x 2.6 x 2.2 cm. Normal appearance/no adnexal mass.  Left ovary  Measurements: 4.0 x 2.6 x 2.2 cm. Normal appearance/no adnexal mass.  Pulsed Doppler evaluation of both ovaries demonstrates normal low-resistance arterial and venous waveforms.  Other findings  No free fluid.  IMPRESSION: Negative exam.   Electronically Signed   By: Inge Rise M.D.   On: 12/09/2014 23:07   US Pelvis Complete  12/09/2014   CLINICAL DATA:  Pelvic pain for 2 weeks with heavy vaginal bleeding for 2 days.  EXAM: TRANSABDOMINAL AND TRANSVAGINAL ULTRASOUND OF PELVIS  DOPPLER ULTRASOUND OF OVARIES  TECHNIQUE: Both transabdominal and transvaginal ultrasound examinations of the pelvis were performed. Transabdominal technique was performed for global imaging of the pelvis including uterus, ovaries, adnexal regions, and pelvic cul-de-sac.  It was necessary to proceed with endovaginal exam following the transabdominal exam to visualize the endometrium. Color and duplex Doppler ultrasound was  utilized to evaluate blood flow to the ovaries.  COMPARISON:  Pelvic ultrasound 10/21/2014.  FINDINGS: Uterus  Measurements: 10.7 x 3.7 x 5.2 cm. No fibroids or other mass visualized.  Endometrium  Thickness: 10 mm.  No focal abnormality visualized.  Right ovary  Measurements: 4.1 x 2.6 x 2.2 cm. Normal appearance/no adnexal mass.  Left ovary  Measurements: 4.0 x 2.6 x 2.2 cm. Normal appearance/no adnexal mass.  Pulsed Doppler evaluation of both ovaries demonstrates normal low-resistance arterial and venous waveforms.  Other findings  No free fluid.  IMPRESSION: Negative exam.   Electronically Signed   By: Inge Rise M.D.   On: 12/09/2014 23:07   [place] Art/ven Flow Abd Pelv Doppler  12/09/2014   CLINICAL DATA:  Pelvic pain for 2 weeks with heavy vaginal bleeding for 2 days.  EXAM: TRANSABDOMINAL AND TRANSVAGINAL ULTRASOUND OF PELVIS  DOPPLER ULTRASOUND OF OVARIES  TECHNIQUE: Both transabdominal and transvaginal ultrasound examinations of the pelvis were performed. Transabdominal technique was performed for global imaging of the pelvis including uterus, ovaries, adnexal regions, and pelvic cul-de-sac.  It was necessary to proceed with endovaginal exam following the transabdominal exam to visualize the endometrium. Color and duplex Doppler ultrasound was utilized to evaluate blood flow to the ovaries.  COMPARISON:  Pelvic ultrasound 10/21/2014.  FINDINGS: Uterus  Measurements: 10.7 x 3.7 x 5.2 cm. No fibroids or other mass visualized.  Endometrium  Thickness: 10 mm.  No focal abnormality visualized.  Right  ovary  Measurements: 4.1 x 2.6 x 2.2 cm. Normal appearance/no adnexal mass.  Left ovary  Measurements: 4.0 x 2.6 x 2.2 cm. Normal appearance/no adnexal mass.  Pulsed Doppler evaluation of both ovaries demonstrates normal low-resistance arterial and venous waveforms.  Other findings  No free fluid.  IMPRESSION: Negative exam.   Electronically Signed   By: Inge Rise M.D.   On: 12/09/2014 23:07     EKG  Interpretation None      MDM   Final diagnoses:  Vaginal bleeding    Medications  sodium chloride 0.9 % bolus 1,000 mL (0 mLs Intravenous Stopped 12/09/14 2149)  morphine 4 MG/ML injection 4 mg (4 mg Intravenous Given 12/09/14 2012)   Filed Vitals:   12/09/14 1708 12/09/14 1959  BP: 127/75 117/71  Pulse: 99 86  Temp: 98.8 F (37.1 C)   TempSrc: Oral   Resp: 18 18  Height: 5\' 7"  (1.702 m)   Weight: 228 lb (103.42 kg)   SpO2: 98% 99%    This provider reviewed the patient's chart. Patient is been seen and assessed in ED setting numerous times regarding the lower abdominal pain, pelvic pain. Patient recently seen on 12/03/2014 regarding pelvic pain with unremarkable findings. Patient was discharged home on Percocets area CBC negative elevated leukocytosis. Hemoglobin 11.7, hematocrit 37.1. When compared to previous labs patient's Hgb has been around 11.7 - chronic. CMP unremarkable. Lipase negative elevation. Wet prep noted few clue cells with negative findings of Trichomonas or white blood cell count. Urine pregnancy negative. Urinalysis noted large hemoglobin with red blood cells too numerous to count small leukocytes and few squamous cells-white blood cell count 3-6. GC/Chlamydia probe pending. HIV pending. Pelvic ultrasound unremarkable. Both ovaries demonstrate normal low resistance arterial and venous waveforms. Negative findings of uterine fibroids. Negative ovarian cyst noted at this time. Negative hemorrhagic cysts. Negative findings of ovarian torsion. Doubt PID. Pelvic exam identified vaginal bleeding-patient is currently menstruating-negative findings of blood clots. Hemoglobin within normal limits when compared to previous labs. Vitals stable. Patient treated with IV fluids and pain medications with relief. Definitive etiology of menorrhagia with regular cycle unknown - possibly due to discontinuing birth control recently. Patient due to move to [facility] tomorrow. Patient  stable, afebrile. Patient not septic appearing. Discharged patient. Referred patient to primary care provider and OB/GYN in [place]. Discussed with patient to rest and stay hydrated. Discussed with patient to closely monitor symptoms and if symptoms are to worsen or change to report back to the ED - strict return instructions given.  Patient agreed to plan of care, understood, all questions answered.   Jamse Mead, PA-C 12/09/14 2354  Charlesetta Shanks, MD 12/10/14 [phone]

## 2014-12-09 NOTE — ED Notes (Signed)
[place] notified pt ready for testing

## 2014-12-09 NOTE — ED Notes (Signed)
Pt given sprite and ice for [place]. Pt needs full bladder for [place]

## 2014-12-10 LAB — GC/CHLAMYDIA PROBE AMP (~~LOC~~) NOT AT ARMC
Chlamydia: NEGATIVE
NEISSERIA GONORRHEA: NEGATIVE

## 2014-12-11 LAB — HIV ANTIBODY (ROUTINE TESTING W REFLEX): HIV SCREEN 4TH GENERATION: NONREACTIVE

## 2015-08-03 ENCOUNTER — Emergency Department (HOSPITAL_BASED_OUTPATIENT_CLINIC_OR_DEPARTMENT_OTHER)
Admission: EM | Admit: 2015-08-03 | Discharge: 2015-08-03 | Disposition: A | Discharge status: Home / Self Care | Payer: Medicaid Other | Attending: Emergency Medicine | Admitting: Emergency Medicine

## 2015-08-03 ENCOUNTER — Encounter (HOSPITAL_BASED_OUTPATIENT_CLINIC_OR_DEPARTMENT_OTHER): Payer: Self-pay

## 2015-08-03 DIAGNOSIS — Y998 Other external cause status: Secondary | ICD-10-CM | POA: Diagnosis not present

## 2015-08-03 DIAGNOSIS — Z8719 Personal history of other diseases of the digestive system: Secondary | ICD-10-CM | POA: Diagnosis not present

## 2015-08-03 DIAGNOSIS — Y9389 Activity, other specified: Secondary | ICD-10-CM | POA: Diagnosis not present

## 2015-08-03 DIAGNOSIS — E119 Type 2 diabetes mellitus without complications: Secondary | ICD-10-CM | POA: Insufficient documentation

## 2015-08-03 DIAGNOSIS — Y92410 Unspecified street and highway as the place of occurrence of the external cause: Secondary | ICD-10-CM | POA: Diagnosis not present

## 2015-08-03 DIAGNOSIS — Z8659 Personal history of other mental and behavioral disorders: Secondary | ICD-10-CM | POA: Insufficient documentation

## 2015-08-03 DIAGNOSIS — Z862 Personal history of diseases of the blood and blood-forming organs and certain disorders involving the immune mechanism: Secondary | ICD-10-CM | POA: Diagnosis not present

## 2015-08-03 DIAGNOSIS — S3992XA Unspecified injury of lower back, initial encounter: Secondary | ICD-10-CM | POA: Insufficient documentation

## 2015-08-03 DIAGNOSIS — Z79899 Other long term (current) drug therapy: Secondary | ICD-10-CM | POA: Diagnosis not present

## 2015-08-03 DIAGNOSIS — S0990XA Unspecified injury of head, initial encounter: Secondary | ICD-10-CM | POA: Insufficient documentation

## 2015-08-03 DIAGNOSIS — Z8742 Personal history of other diseases of the female genital tract: Secondary | ICD-10-CM | POA: Diagnosis not present

## 2015-08-03 DIAGNOSIS — Z720 Tobacco use: Secondary | ICD-10-CM | POA: Insufficient documentation

## 2015-08-03 DIAGNOSIS — M545 Low back pain, unspecified: Secondary | ICD-10-CM

## 2015-08-03 DIAGNOSIS — Z8619 Personal history of other infectious and parasitic diseases: Secondary | ICD-10-CM | POA: Diagnosis not present

## 2015-08-03 HISTORY — DX: Type 2 diabetes mellitus without complications: E11.9

## 2015-08-03 MED ORDER — OXYCODONE-ACETAMINOPHEN 5-325 MG PO TABS: 1.0000 | ORAL_TABLET | ORAL | Status: DC | PRN

## 2015-08-03 MED ORDER — METHOCARBAMOL 500 MG PO TABS: 500.0000 mg | ORAL_TABLET | Freq: Two times a day (BID) | ORAL | Status: DC

## 2015-08-03 MED ORDER — IBUPROFEN 800 MG PO TABS: 800.0000 mg | ORAL_TABLET | Freq: Three times a day (TID) | ORAL | Status: DC

## 2015-08-03 MED ORDER — METHOCARBAMOL 500 MG PO TABS
1000.0000 mg | ORAL_TABLET | Freq: Once | ORAL | Status: AC
  Administered 2015-08-03: 1000 mg via ORAL
  Filled 2015-08-03: qty 2

## 2015-08-03 MED ORDER — OXYCODONE-ACETAMINOPHEN 5-325 MG PO TABS
1.0000 | ORAL_TABLET | Freq: Once | ORAL | Status: AC
  Administered 2015-08-03: 1 via ORAL
  Filled 2015-08-03: qty 1

## 2015-08-03 MED ORDER — OXYCODONE-ACETAMINOPHEN 5-325 MG PO TABS
1.0000 | ORAL_TABLET | ORAL | Status: DC | PRN
Start: 2015-08-03 — End: 2016-06-08

## 2015-08-03 MED ORDER — IBUPROFEN 800 MG PO TABS
800.0000 mg | ORAL_TABLET | Freq: Once | ORAL | Status: AC
  Administered 2015-08-03: 800 mg via ORAL
  Filled 2015-08-03: qty 1
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
## 2015-09-07 ENCOUNTER — Emergency Department (HOSPITAL_COMMUNITY)
Admission: EM | Admit: 2015-09-07 | Discharge: 2015-09-08 | Disposition: A | Discharge status: Home / Self Care | Payer: Medicaid Other | Attending: Emergency Medicine | Admitting: Emergency Medicine

## 2015-09-07 ENCOUNTER — Encounter (HOSPITAL_COMMUNITY): Payer: Self-pay | Admitting: Emergency Medicine

## 2015-09-07 ENCOUNTER — Emergency Department (HOSPITAL_COMMUNITY): Payer: Medicaid Other

## 2015-09-07 DIAGNOSIS — F17210 Nicotine dependence, cigarettes, uncomplicated: Secondary | ICD-10-CM | POA: Insufficient documentation

## 2015-09-07 DIAGNOSIS — F419 Anxiety disorder, unspecified: Secondary | ICD-10-CM | POA: Insufficient documentation

## 2015-09-07 DIAGNOSIS — Z8742 Personal history of other diseases of the female genital tract: Secondary | ICD-10-CM | POA: Diagnosis not present

## 2015-09-07 DIAGNOSIS — R1084 Generalized abdominal pain: Secondary | ICD-10-CM

## 2015-09-07 DIAGNOSIS — R1010 Upper abdominal pain, unspecified: Secondary | ICD-10-CM | POA: Diagnosis present

## 2015-09-07 DIAGNOSIS — Z3202 Encounter for pregnancy test, result negative: Secondary | ICD-10-CM | POA: Insufficient documentation

## 2015-09-07 DIAGNOSIS — E119 Type 2 diabetes mellitus without complications: Secondary | ICD-10-CM | POA: Diagnosis not present

## 2015-09-07 DIAGNOSIS — Z862 Personal history of diseases of the blood and blood-forming organs and certain disorders involving the immune mechanism: Secondary | ICD-10-CM | POA: Insufficient documentation

## 2015-09-07 DIAGNOSIS — R112 Nausea with vomiting, unspecified: Secondary | ICD-10-CM | POA: Diagnosis not present

## 2015-09-07 DIAGNOSIS — K5901 Slow transit constipation: Secondary | ICD-10-CM

## 2015-09-07 DIAGNOSIS — Z8619 Personal history of other infectious and parasitic diseases: Secondary | ICD-10-CM | POA: Diagnosis not present

## 2015-09-07 DIAGNOSIS — Z79899 Other long term (current) drug therapy: Secondary | ICD-10-CM | POA: Insufficient documentation

## 2015-09-07 LAB — COMPREHENSIVE METABOLIC PANEL
ALT: 18 U/L (ref 14–54)
AST: 17 U/L (ref 15–41)
Albumin: 3.8 g/dL (ref 3.5–5.0)
Alkaline Phosphatase: 67 U/L (ref 38–126)
Anion gap: 6 (ref 5–15)
BILIRUBIN TOTAL: 0.5 mg/dL (ref 0.3–1.2)
BUN: 14 mg/dL (ref 6–20)
CHLORIDE: 108 mmol/L (ref 101–111)
CO2: 24 mmol/L (ref 22–32)
CREATININE: 0.95 mg/dL (ref 0.44–1.00)
Calcium: 8.8 mg/dL — ABNORMAL LOW (ref 8.9–10.3)
GFR calc non Af Amer: >60 mL/min (ref >60)
Glucose, Bld: 105 mg/dL — ABNORMAL HIGH (ref 65–99)
POTASSIUM: 3.9 mmol/L (ref 3.5–5.1)
Sodium: 138 mmol/L (ref 135–145)
TOTAL PROTEIN: 7.6 g/dL (ref 6.5–8.1)

## 2015-09-07 LAB — URINALYSIS, ROUTINE W REFLEX MICROSCOPIC
BILIRUBIN URINE: NEGATIVE
Glucose, UA: NEGATIVE mg/dL
Hgb urine dipstick: NEGATIVE
Ketones, ur: NEGATIVE mg/dL
LEUKOCYTES UA: NEGATIVE
NITRITE: NEGATIVE
PROTEIN: NEGATIVE mg/dL
Specific Gravity, Urine: 1.005 (ref 1.005–1.030)
pH: 7 (ref 5.0–8.0)

## 2015-09-07 LAB — CBC
HEMATOCRIT: 37 % (ref 36.0–46.0)
Hemoglobin: 11.6 g/dL — ABNORMAL LOW (ref 12.0–15.0)
MCH: 25.7 pg — ABNORMAL LOW (ref 26.0–34.0)
MCHC: 31.4 g/dL (ref 30.0–36.0)
MCV: 82 fL (ref 78.0–100.0)
PLATELETS: 286 10*3/uL (ref 150–400)
RBC: 4.51 MIL/uL (ref 3.87–5.11)
RDW: 15.1 % (ref 11.5–15.5)
WBC: 5.5 10*3/uL (ref 4.0–10.5)

## 2015-09-07 LAB — POC URINE PREG, ED: PREG TEST UR: NEGATIVE

## 2015-09-07 LAB — LIPASE, BLOOD: LIPASE: 36 U/L (ref 11–51)

## 2015-09-07 MED ORDER — ONDANSETRON HCL 4 MG/2ML IJ SOLN
4.0000 mg | Freq: Once | INTRAMUSCULAR | Status: AC | PRN
  Administered 2015-09-07: 4 mg via INTRAVENOUS
  Filled 2015-09-07: qty 2

## 2015-09-07 MED ORDER — SODIUM CHLORIDE 0.9 % IV BOLUS (SEPSIS)
500.0000 mL | Freq: Once | INTRAVENOUS | Status: AC
  Administered 2015-09-07: 500 mL via INTRAVENOUS

## 2015-09-07 MED ORDER — MORPHINE SULFATE (PF) 4 MG/ML IV SOLN
4.0000 mg | Freq: Once | INTRAVENOUS | Status: AC
  Administered 2015-09-07: 4 mg via INTRAVENOUS
  Filled 2015-09-07: qty 1

## 2015-09-07 NOTE — ED Notes (Signed)
Bed: WA09 Expected date:  Expected time:  Means of arrival:  Comments: EMS 28yo F abd pain

## 2015-09-07 NOTE — ED Provider Notes (Signed)
CSN: [id number]     Arrival date & time 09/07/15  2236 History   By signing my name below, I, Eustaquio Maize, attest that this documentation has been prepared under the direction and in the presence of Orpah Greek, MD. Electronically Signed: Eustaquio Maize, ED Scribe. 09/07/2015. 11:20 PM.  Chief Complaint  Patient presents with  . Abdominal Pain   The history is provided by the patient. No language interpreter was used.     HPI Comments: Jodi Mcgee is a 29 y.o. female brought in by ambulance, who presents to the Emergency Department complaining of gradual onset, constant, waxing and waning, upper abdominal pain x 3 days, gradually worsening. There are no modifying factors to the pain. She has not taken any pain medication for her symptoms. Pt also complains of vomiting and abdominal distension. She reports that her last bowel movement was approximately 2 days ago. Pt had similar symptoms in August 2016 (4 months ago) when she had a SBO. She reports that she did not need surgery at that time. Denies diarrhea or any other associated symptoms.   Past Medical History  Diagnosis Date  . Ovarian cyst   . Peritonitis, acute generalized ([place])   . Sepsis ([place])   . Anxiety   . Anemia   . GERD (gastroesophageal reflux disease)     has resolved  . IBS (irritable bowel syndrome)   . Diabetes mellitus without complication [facility])    Past Surgical History  Procedure Laterality Date  . Cesarean section    . Ovarian cyst drainage    . Appendectomy      ruptured    No family history on file. Social History  Substance Use Topics  . Smoking status: Current Every Day Smoker -- 0.25 packs/day    Types: Cigarettes  . Smokeless tobacco: Never Used  . Alcohol Use: Yes     Comment: occassional   OB History    Gravida Para Term Preterm AB TAB SAB Ectopic Multiple Living   2 2 2       2      Review of Systems  Gastrointestinal: Positive for nausea, vomiting, abdominal pain and  constipation. Negative for diarrhea.  All other systems reviewed and are negative.  Allergies  Contrast media  Home Medications   Prior to Admission medications   Medication Sig Start Date End Date Taking? Authorizing Provider  dicyclomine (BENTYL) 20 MG tablet Take 1 tablet (20 mg total) by mouth 2 (two) times daily. 12/03/14  Yes Pamella Pert, MD  methocarbamol (ROBAXIN) 500 MG tablet Take 1 tablet (500 mg total) by mouth 2 (two) times daily. 08/03/15  Yes John Molpus, MD  ondansetron (ZOFRAN) 4 MG tablet Take 1 tablet (4 mg total) by mouth every 8 (eight) hours as needed for nausea or vomiting. 10/21/14  Yes Dahlia Bailiff, PA-C  sertraline (ZOLOFT) 100 MG tablet Take 100 mg by mouth daily.   Yes Historical Provider, MD  ibuprofen (ADVIL,MOTRIN) 800 MG tablet Take 1 tablet (800 mg total) by mouth 3 (three) times daily. 08/03/15   John Molpus, MD  oxyCODONE-acetaminophen (PERCOCET) 5-325 MG per tablet Take 1 tablet by mouth every 6 (six) hours as needed. Patient not taking: Reported on 09/07/2015 12/03/14   Pamella Pert, MD  oxyCODONE-acetaminophen (PERCOCET/ROXICET) 5-325 MG tablet Take 1 tablet by mouth every 4 (four) hours as needed for severe pain. Patient not taking: Reported on 09/07/2015 08/03/15   Shanon Rosser, MD   Triage Vitals: BP 154/111 mmHg  Pulse  70  Temp(Src) 100 F (37.8 C) (Oral)  Resp 22  SpO2 97%  LMP 08/24/2015 (Exact Date)   Physical Exam  Constitutional: She is oriented to person, place, and time. She appears well-developed and well-nourished. No distress.  HENT:  Head: Normocephalic and atraumatic.  Right Ear: Hearing normal.  Left Ear: Hearing normal.  Nose: Nose normal.  Mouth/Throat: Oropharynx is clear and moist and mucous membranes are normal.  Eyes: Conjunctivae and EOM are normal. Pupils are equal, round, and reactive to light.  Neck: Normal range of motion. Neck supple.  Cardiovascular: Regular rhythm, S1 normal and S2 normal.  Exam reveals no  gallop and no friction rub.   No murmur heard. Pulmonary/Chest: Effort normal and breath sounds normal. No respiratory distress. She exhibits no tenderness.  Abdominal: Soft. Normal appearance. She exhibits distension. There is no hepatosplenomegaly. There is tenderness. There is no rebound, no guarding, no tenderness at McBurney's point and negative Murphy's sign. No hernia.  Slightly distended Diffusely tender Bowel sounds are hyperactive but high pitched  Musculoskeletal: Normal range of motion.  Neurological: She is alert and oriented to person, place, and time. She has normal strength. No cranial nerve deficit or sensory deficit. Coordination normal. GCS eye subscore is 4. GCS verbal subscore is 5. GCS motor subscore is 6.  Skin: Skin is warm, dry and intact. No rash noted. No cyanosis.  Psychiatric: She has a normal mood and affect. Her speech is normal and behavior is normal. Thought content normal.  Nursing note and vitals reviewed.   ED Course  Procedures (including critical care time)  DIAGNOSTIC STUDIES: Oxygen Saturation is 97% on RA, normal by my interpretation.    COORDINATION OF CARE: 11:18 PM-Discussed treatment plan which includes CT A/P, Urine pregnancy, Lipase, CMP, CBC, and UA with pt at bedside and pt agreed to plan.   Labs Review Labs Reviewed  CBC - Abnormal; Notable for the following:    Hemoglobin 11.6 (*)    MCH 25.7 (*)    All other components within normal limits  URINALYSIS, ROUTINE W REFLEX MICROSCOPIC (NOT AT [facility]) - Abnormal; Notable for the following:    APPearance CLOUDY (*)    All other components within normal limits  LIPASE, BLOOD  COMPREHENSIVE METABOLIC PANEL  POC URINE PREG, ED    Imaging Review No results found. I have personally reviewed and evaluated these images and lab results as part of my medical decision-making.   EKG Interpretation None      MDM   Final diagnoses:  None   Presents to the ER for evaluation of abdominal  pain. Patient reports that she has been feeling constipated for the last 2 days, having progressive distention of her abdomen. She is, however, concerned because of a history of small bowel obstruction secondary to multiple abdominal surgeries. She has been expressing nausea and vomiting. Abdominal exam revealed mild distention and diffuse tenderness, she does have active bowel sounds. Lab work was unremarkable. CT scan performed to further evaluate. She does not have any evidence of small bowel obstruction or partial small bowel obstruction. Findings most consistent with constipation. Will discharge with bowel prep.  I personally performed the services described in this documentation, which was scribed in my presence. The recorded information has been reviewed and is accurate.      Orpah Greek, MD 09/08/15 [phone]

## 2015-09-07 NOTE — Progress Notes (Signed)
Patient listed as having Medicaid [employer] insurance without a pcp.  Pcp listed on patient's insurance card is located at the [facility].  System updated.

## 2015-09-07 NOTE — ED Notes (Signed)
Pt presents via EMS from a hotel in [place] c/o 10/10 upper abd pain and constipation x 2 days.  Hx of appendectomy, ovarian cysts, sepsis, and SBO per EMS. Denies nausea, vomiting, SOB. Tender to palpation.  Vitals WNL per EMS.  120/78, 96, 99% room air, RR16.

## 2015-09-08 MED ORDER — MORPHINE SULFATE (PF) 4 MG/ML IV SOLN
4.0000 mg | Freq: Once | INTRAVENOUS | Status: AC
  Administered 2015-09-08: 4 mg via INTRAVENOUS

## 2015-09-08 MED ORDER — MORPHINE SULFATE (PF) 4 MG/ML IV SOLN
INTRAVENOUS | Status: AC
  Filled 2015-09-08: qty 1

## 2015-09-08 MED ORDER — DOCUSATE SODIUM 100 MG PO CAPS: 100.0000 mg | ORAL_CAPSULE | Freq: Two times a day (BID) | ORAL | Status: DC

## 2015-09-08 MED ORDER — MAGNESIUM CITRATE PO SOLN: 1.0000 | Freq: Once | ORAL | Status: DC

## 2015-09-08 MED ORDER — POLYETHYLENE GLYCOL 3350 17 G PO PACK: 17.0000 g | PACK | Freq: Every day | ORAL | Status: DC | PRN

## 2015-09-08 NOTE — Discharge Instructions (Signed)
Abdominal Pain, Adult °Many things can cause abdominal pain. Usually, abdominal pain is not caused by a disease and will improve without treatment. It can often be observed and treated at home. Your health care provider will do a physical exam and possibly order blood tests and X-rays to help determine the seriousness of your pain. However, in many cases, more time must pass before a clear cause of the pain can be found. Before that point, your health care provider may not know if you need more testing or further treatment. °HOME CARE INSTRUCTIONS °Monitor your abdominal pain for any changes. The following actions may help to alleviate any discomfort you are experiencing: °· Only take over-the-counter or prescription medicines as directed by your health care provider. °· Do not take laxatives unless directed to do so by your health care provider. °· Try a clear liquid diet (broth, tea, or water) as directed by your health care provider. Slowly move to a bland diet as tolerated. °SEEK MEDICAL CARE IF: °· You have unexplained abdominal pain. °· You have abdominal pain associated with nausea or diarrhea. °· You have pain when you urinate or have a bowel movement. °· You experience abdominal pain that wakes you in the night. °· You have abdominal pain that is worsened or improved by eating food. °· You have abdominal pain that is worsened with eating fatty foods. °· You have a fever. °SEEK IMMEDIATE MEDICAL CARE IF: °· Your pain does not go away within 2 hours. °· You keep throwing up (vomiting). °· Your pain is felt only in portions of the abdomen, such as the right side or the left lower portion of the abdomen. °· You pass bloody or black tarry stools. °MAKE SURE YOU: °· Understand these instructions. °· Will watch your condition. °· Will get help right away if you are not doing well or get worse. °  °This information is not intended to replace advice given to you by your health care provider. Make sure you discuss  any questions you have with your health care provider. °  °Document Released: 07/19/2005 Document Revised: 06/30/2015 Document Reviewed: 06/18/2013 °Elsevier Interactive Patient Education ©2016 [employer] Inc[employer] ° °Constipation, Adult °Constipation is when a person has fewer than three bowel movements a week, has difficulty having a bowel movement, or has stools that are dry, hard, or larger than normal. As people grow older, constipation is more common. A low-fiber diet, not taking in enough fluids, and taking certain medicines may make constipation worse.  °CAUSES  °· Certain medicines, such as antidepressants, pain medicine, iron supplements, antacids, and water pills.   °· Certain diseases, such as diabetes, irritable bowel syndrome (IBS), thyroid disease, or depression.   °· Not drinking enough water.   °· Not eating enough fiber-rich foods.   °· Stress or travel.   °· Lack of physical activity or exercise.   °· Ignoring the urge to have a bowel movement.   °· Using laxatives too much.   °SIGNS AND SYMPTOMS  °· Having fewer than three bowel movements a week.   °· Straining to have a bowel movement.   °· Having stools that are hard, dry, or larger than normal.   °· Feeling full or bloated.   °· Pain in the lower abdomen.   °· Not feeling relief after having a bowel movement.   °DIAGNOSIS  °Your health care provider will take a medical history and perform a physical exam. Further testing may be done for severe constipation. Some tests may include: °· A barium enema X-ray to examine your rectum, colon, and, sometimes,   your small intestine.   °· A sigmoidoscopy to examine your lower colon.   °· A colonoscopy to examine your entire colon. °TREATMENT  °Treatment will depend on the severity of your constipation and what is causing it. Some dietary treatments include drinking more fluids and eating more fiber-rich foods. Lifestyle treatments may include regular exercise. If these diet and lifestyle recommendations do not  help, your health care provider may recommend taking over-the-counter laxative medicines to help you have bowel movements. Prescription medicines may be prescribed if over-the-counter medicines do not work.  °HOME CARE INSTRUCTIONS  °· Eat foods that have a lot of fiber, such as fruits, vegetables, whole grains, and beans. °· Limit foods high in fat and processed sugars, such as french fries, hamburgers, cookies, candies, and soda.   °· A fiber supplement may be added to your diet if you cannot get enough fiber from foods.   °· Drink enough fluids to keep your urine clear or pale yellow.   °· Exercise regularly or as directed by your health care provider.   °· Go to the restroom when you have the urge to go. Do not hold it.   °· Only take over-the-counter or prescription medicines as directed by your health care provider. Do not take other medicines for constipation without talking to your health care provider first.   °SEEK IMMEDIATE MEDICAL CARE IF:  °· You have bright red blood in your stool.   °· Your constipation lasts for more than 4 days or gets worse.   °· You have abdominal or rectal pain.   °· You have thin, pencil-like stools.   °· You have unexplained weight loss. °MAKE SURE YOU:  °· Understand these instructions. °· Will watch your condition. °· Will get help right away if you are not doing well or get worse. °  °This information is not intended to replace advice given to you by your health care provider. Make sure you discuss any questions you have with your health care provider. °  °Document Released: 07/07/2004 Document Revised: 10/30/2014 Document Reviewed: 07/21/2013 °Elsevier Interactive Patient Education ©2016 Elsevier Inc. ° °

## 2015-10-27 ENCOUNTER — Emergency Department (HOSPITAL_COMMUNITY)
Admission: EM | Admit: 2015-10-27 | Discharge: 2015-10-27 | Disposition: A | Discharge status: Home / Self Care | Payer: Medicaid Other | Attending: Emergency Medicine | Admitting: Emergency Medicine

## 2015-10-27 DIAGNOSIS — Z79899 Other long term (current) drug therapy: Secondary | ICD-10-CM | POA: Insufficient documentation

## 2015-10-27 DIAGNOSIS — Z8659 Personal history of other mental and behavioral disorders: Secondary | ICD-10-CM | POA: Insufficient documentation

## 2015-10-27 DIAGNOSIS — Z8619 Personal history of other infectious and parasitic diseases: Secondary | ICD-10-CM | POA: Insufficient documentation

## 2015-10-27 DIAGNOSIS — Z9049 Acquired absence of other specified parts of digestive tract: Secondary | ICD-10-CM | POA: Insufficient documentation

## 2015-10-27 DIAGNOSIS — F17210 Nicotine dependence, cigarettes, uncomplicated: Secondary | ICD-10-CM | POA: Diagnosis not present

## 2015-10-27 DIAGNOSIS — E119 Type 2 diabetes mellitus without complications: Secondary | ICD-10-CM | POA: Diagnosis not present

## 2015-10-27 DIAGNOSIS — Z862 Personal history of diseases of the blood and blood-forming organs and certain disorders involving the immune mechanism: Secondary | ICD-10-CM | POA: Diagnosis not present

## 2015-10-27 DIAGNOSIS — Z8742 Personal history of other diseases of the female genital tract: Secondary | ICD-10-CM | POA: Diagnosis not present

## 2015-10-27 DIAGNOSIS — K529 Noninfective gastroenteritis and colitis, unspecified: Secondary | ICD-10-CM | POA: Insufficient documentation

## 2015-10-27 DIAGNOSIS — R197 Diarrhea, unspecified: Secondary | ICD-10-CM

## 2015-10-27 DIAGNOSIS — Z3202 Encounter for pregnancy test, result negative: Secondary | ICD-10-CM | POA: Insufficient documentation

## 2015-10-27 DIAGNOSIS — R112 Nausea with vomiting, unspecified: Secondary | ICD-10-CM

## 2015-10-27 DIAGNOSIS — R109 Unspecified abdominal pain: Secondary | ICD-10-CM | POA: Diagnosis present

## 2015-10-27 LAB — COMPREHENSIVE METABOLIC PANEL
ALK PHOS: 69 U/L (ref 38–126)
ALT: 18 U/L (ref 14–54)
ANION GAP: 7 (ref 5–15)
AST: 17 U/L (ref 15–41)
Albumin: 4.2 g/dL (ref 3.5–5.0)
BILIRUBIN TOTAL: 0.4 mg/dL (ref 0.3–1.2)
BUN: 16 mg/dL (ref 6–20)
CALCIUM: 9.3 mg/dL (ref 8.9–10.3)
CO2: 28 mmol/L (ref 22–32)
Chloride: 108 mmol/L (ref 101–111)
Creatinine, Ser: 0.9 mg/dL (ref 0.44–1.00)
GFR calc non Af Amer: >60 mL/min (ref >60)
GLUCOSE: 97 mg/dL (ref 65–99)
Potassium: 4 mmol/L (ref 3.5–5.1)
Sodium: 143 mmol/L (ref 135–145)
TOTAL PROTEIN: 7.9 g/dL (ref 6.5–8.1)

## 2015-10-27 LAB — URINALYSIS, ROUTINE W REFLEX MICROSCOPIC
BILIRUBIN URINE: NEGATIVE
Glucose, UA: NEGATIVE mg/dL
HGB URINE DIPSTICK: NEGATIVE
KETONES UR: NEGATIVE mg/dL
Leukocytes, UA: NEGATIVE
NITRITE: NEGATIVE
PH: 6 (ref 5.0–8.0)
Protein, ur: NEGATIVE mg/dL
Specific Gravity, Urine: 1.037 — ABNORMAL HIGH (ref 1.005–1.030)

## 2015-10-27 LAB — CBC WITH DIFFERENTIAL/PLATELET
Basophils Absolute: 0 10*3/uL (ref 0.0–0.1)
Basophils Relative: 1 %
Eosinophils Absolute: 0 10*3/uL (ref 0.0–0.7)
Eosinophils Relative: 1 %
HEMATOCRIT: 36.8 % (ref 36.0–46.0)
HEMOGLOBIN: 11.2 g/dL — AB (ref 12.0–15.0)
LYMPHS ABS: 1.5 10*3/uL (ref 0.7–4.0)
Lymphocytes Relative: 32 %
MCH: 25.5 pg — AB (ref 26.0–34.0)
MCHC: 30.4 g/dL (ref 30.0–36.0)
MCV: 83.6 fL (ref 78.0–100.0)
MONOS PCT: 7 %
Monocytes Absolute: 0.3 10*3/uL (ref 0.1–1.0)
NEUTROS ABS: 2.8 10*3/uL (ref 1.7–7.7)
NEUTROS PCT: 59 %
Platelets: 303 10*3/uL (ref 150–400)
RBC: 4.4 MIL/uL (ref 3.87–5.11)
RDW: 14.7 % (ref 11.5–15.5)
WBC: 4.8 10*3/uL (ref 4.0–10.5)

## 2015-10-27 LAB — I-STAT BETA HCG BLOOD, ED (MC, WL, AP ONLY): I-stat hCG, quantitative: <5 m[IU]/mL (ref <5)

## 2015-10-27 LAB — LIPASE, BLOOD: Lipase: 25 U/L (ref 11–51)

## 2015-10-27 MED ORDER — ONDANSETRON 4 MG PO TBDP
4.0000 mg | ORAL_TABLET | Freq: Three times a day (TID) | ORAL | Status: DC | PRN
Start: 2015-10-27 — End: 2015-11-04

## 2015-10-27 MED ORDER — ONDANSETRON 4 MG PO TBDP: 4.0000 mg | ORAL_TABLET | Freq: Three times a day (TID) | ORAL | Status: DC | PRN

## 2015-10-27 MED ORDER — ONDANSETRON HCL 4 MG/2ML IJ SOLN
4.0000 mg | Freq: Once | INTRAMUSCULAR | Status: AC
  Administered 2015-10-27: 4 mg via INTRAVENOUS
  Filled 2015-10-27: qty 2

## 2015-10-27 MED ORDER — SODIUM CHLORIDE 0.9 % IV BOLUS (SEPSIS)
1000.0000 mL | Freq: Once | INTRAVENOUS | Status: AC
  Administered 2015-10-27: 1000 mL via INTRAVENOUS

## 2015-10-27 MED ORDER — DIPHENOXYLATE-ATROPINE 2.5-0.025 MG PO TABS: 1.0000 | ORAL_TABLET | Freq: Four times a day (QID) | ORAL | Status: DC | PRN

## 2015-10-27 MED ORDER — DIPHENOXYLATE-ATROPINE 2.5-0.025 MG PO TABS
2.0000 | ORAL_TABLET | Freq: Once | ORAL | Status: AC
  Administered 2015-10-27: 2 via ORAL
  Filled 2015-10-27: qty 2

## 2015-10-27 MED ORDER — MORPHINE SULFATE (PF) 4 MG/ML IV SOLN
4.0000 mg | INTRAVENOUS | Status: DC | PRN
  Administered 2015-10-27: 4 mg via INTRAVENOUS
  Filled 2015-10-27: qty 1

## 2015-10-27 NOTE — ED Provider Notes (Signed)
CSN: [id number]     Arrival date & time 10/27/15  0009 History   By signing my name below, I, Altamease Oiler, attest that this documentation has been prepared under the direction and in the presence of Tanna Furry, MD. Electronically Signed: Altamease Oiler, ED Scribe. 10/27/2015. 1:01 AM   Chief Complaint  Patient presents with  . Abdominal Pain    The history is provided by the patient. No language interpreter was used.   Jodi Mcgee is a 30 y.o. female with history of GERD, IBS, peritonitis, and DM who presents to the Emergency Department complaining of constant, 10/10 in severity, burning,  right -sided abdominal pain with onset 4 days ago. This pain is different in quality than the right upper abdominal pain that she was seen for in the past.  Associated symptoms include nausea, vomiting, diarrhea, and fever up to 102 F four days ago. Past surgical history includes appendectomy, and she states that at the time of that procedure she was told that she needed to have her gallbladder removed at a later date. Pt also had a CT guided drainage of ovarian cysts in the past.   Past Medical History  Diagnosis Date  . Ovarian cyst   . Peritonitis, acute generalized ([place])   . Sepsis ([place])   . Anxiety   . Anemia   . GERD (gastroesophageal reflux disease)     has resolved  . IBS (irritable bowel syndrome)   . Diabetes mellitus without complication [facility])    Past Surgical History  Procedure Laterality Date  . Cesarean section    . Ovarian cyst drainage    . Appendectomy      ruptured    No family history on file. Social History  Substance Use Topics  . Smoking status: Current Every Day Smoker -- 0.25 packs/day    Types: Cigarettes  . Smokeless tobacco: Never Used  . Alcohol Use: Yes     Comment: occassional   OB History    Gravida Para Term Preterm AB TAB SAB Ectopic Multiple Living   2 2 2       2      Review of Systems  Constitutional: Positive for fever. Negative for  chills, diaphoresis, appetite change and fatigue.  HENT: Negative for mouth sores, sore throat and trouble swallowing.   Eyes: Negative for visual disturbance.  Respiratory: Negative for cough, chest tightness, shortness of breath and wheezing.   Cardiovascular: Negative for chest pain.  Gastrointestinal: Positive for nausea, vomiting, abdominal pain and diarrhea. Negative for abdominal distention.  Endocrine: Negative for polydipsia, polyphagia and polyuria.  Genitourinary: Negative for dysuria, frequency and hematuria.  Musculoskeletal: Negative for gait problem.  Skin: Negative for color change, pallor and rash.  Neurological: Negative for dizziness, syncope, light-headedness and headaches.  Hematological: Does not bruise/bleed easily.  Psychiatric/Behavioral: Negative for behavioral problems and confusion.    Allergies  Contrast media  Home Medications   Prior to Admission medications   Medication Sig Start Date End Date Taking? Authorizing Provider  carisoprodol (SOMA) 350 MG tablet Take 350 mg by mouth 4 (four) times daily as needed for muscle spasms.   Yes Historical Provider, MD  dicyclomine (BENTYL) 20 MG tablet Take 1 tablet (20 mg total) by mouth 2 (two) times daily. 12/03/14  Yes Pamella Pert, MD  ondansetron (ZOFRAN) 4 MG tablet Take 1 tablet (4 mg total) by mouth every 8 (eight) hours as needed for nausea or vomiting. 10/21/14  Yes Dahlia Bailiff, PA-C  diphenoxylate-atropine (  LOMOTIL) 2.5-0.025 MG tablet Take 1 tablet by mouth 4 (four) times daily as needed for diarrhea or loose stools. 10/27/15   Tanna Furry, MD  docusate sodium (COLACE) 100 MG capsule Take 1 capsule (100 mg total) by mouth every 12 (twelve) hours. Patient not taking: Reported on 10/27/2015 09/08/15   Orpah Greek, MD  magnesium citrate SOLN Take 296 mLs (1 Bottle total) by mouth once. Patient not taking: Reported on 10/27/2015 09/08/15   Orpah Greek, MD  methocarbamol (ROBAXIN) 500 MG tablet  Take 1 tablet (500 mg total) by mouth 2 (two) times daily. Patient not taking: Reported on 10/27/2015 08/03/15   Shanon Rosser, MD  ondansetron (ZOFRAN ODT) 4 MG disintegrating tablet Take 1 tablet (4 mg total) by mouth every 8 (eight) hours as needed for nausea. 10/27/15   Tanna Furry, MD  oxyCODONE-acetaminophen (PERCOCET/ROXICET) 5-325 MG tablet Take 1 tablet by mouth every 4 (four) hours as needed for severe pain. Patient not taking: Reported on 09/07/2015 08/03/15   Shanon Rosser, MD  polyethylene glycol (MIRALAX / GLYCOLAX) packet Take 17 g by mouth daily as needed for moderate constipation. Patient not taking: Reported on 10/27/2015 09/08/15   Orpah Greek, MD   BP 118/61 mmHg  Pulse 78  Temp(Src) 98.6 F (37 C) (Oral)  Resp 19  Ht 5\' 7"  (1.702 m)  Wt 210 lb (95.255 kg)  BMI 32.88 kg/m2  SpO2 97% Physical Exam  Constitutional: She is oriented to person, place, and time. She appears well-developed and well-nourished. No distress.  HENT:  Head: Normocephalic.  Eyes: Conjunctivae are normal. Pupils are equal, round, and reactive to light. No scleral icterus.  Neck: Normal range of motion. Neck supple. No thyromegaly present.  Cardiovascular: Normal rate and regular rhythm.  Exam reveals no gallop and no friction rub.   No murmur heard. Pulmonary/Chest: Effort normal and breath sounds normal. No respiratory distress. She has no wheezes. She has no rales.  Abdominal: Soft. Bowel sounds are normal. She exhibits no distension. There is tenderness in the epigastric area. There is no rebound.  Musculoskeletal: Normal range of motion.  Neurological: She is alert and oriented to person, place, and time.  Skin: Skin is warm and dry. No rash noted.  Psychiatric: She has a normal mood and affect. Her behavior is normal.    ED Course  Procedures (including critical care time) DIAGNOSTIC STUDIES: Oxygen Saturation is 97% on RA,  normal by my interpretation.    COORDINATION OF CARE: 12:57  AM Discussed treatment plan which includes lab work with pt at bedside and pt agreed to plan.  Labs Review Labs Reviewed  CBC WITH DIFFERENTIAL/PLATELET - Abnormal; Notable for the following:    Hemoglobin 11.2 (*)    MCH 25.5 (*)    All other components within normal limits  URINALYSIS, ROUTINE W REFLEX MICROSCOPIC (NOT AT [facility]) - Abnormal; Notable for the following:    APPearance HAZY (*)    Specific Gravity, Urine 1.037 (*)    All other components within normal limits  COMPREHENSIVE METABOLIC PANEL  LIPASE, BLOOD  I-STAT BETA HCG BLOOD, ED ([facility], [facility], [facility] ONLY)    Imaging Review No results found. I have personally reviewed and evaluated these lab results as part of my medical decision-making.   EKG Interpretation None      MDM   Final diagnoses:  Gastroenteritis  Non-intractable vomiting with nausea, vomiting of unspecified type  Diarrhea, unspecified type    Benign exam. Reassuring labs. Similar episodes  over the last several months. Primary care follow-up encouraged.  I personally performed the services described in this documentation, which was scribed in my presence. The recorded information has been reviewed and is accurate.    Tanna Furry, MD 10/27/15 [phone]

## 2015-10-27 NOTE — ED Notes (Signed)
Pt with 4 day hx of upper quad abd pain with nausea & vomiting.  Has ODT zofran @ home.  Has had some type of gallbladder in the past.

## 2015-10-27 NOTE — ED Notes (Signed)
Bed: [id number] Expected date:  Expected time:  Means of arrival:  Comments: abd pain, n, v

## 2015-10-27 NOTE — Discharge Instructions (Signed)
Diarrhea °Diarrhea is watery poop (stool). It can make you feel weak, tired, thirsty, or give you a dry mouth (signs of dehydration). Watery poop is a sign of another problem, most often an infection. It often lasts 2-3 days. It can last longer if it is a sign of something serious. Take care of yourself as told by your doctor. °HOME CARE  °· Drink 1 cup (8 ounces) of fluid each time you have watery poop. °· Do not drink the following fluids: °· Those that contain simple sugars (fructose, glucose, galactose, lactose, sucrose, maltose). °· Sports drinks. °· Fruit juices. °· Whole milk products. °· Sodas. °· Drinks with caffeine (coffee, tea, soda) or alcohol. °· Oral rehydration solution may be used if the doctor says it is okay. You may make your own solution. Follow this recipe: °·  - teaspoon table salt. °· ¾ teaspoon baking soda. °·  teaspoon salt substitute containing potassium chloride. °· 1 tablespoons sugar. °· 1 liter (34 ounces) of water. °· Avoid the following foods: °· High fiber foods, such as raw fruits and vegetables. °· Nuts, seeds, and whole grain breads and cereals. °·  Those that are sweetened with sugar alcohols (xylitol, sorbitol, mannitol). °· Try eating the following foods: °· Starchy foods, such as rice, toast, pasta, low-sugar cereal, oatmeal, baked potatoes, crackers, and bagels. °· Bananas. °· Applesauce. °· Eat probiotic-rich foods, such as yogurt and milk products that are fermented. °· Wash your hands well after each time you have watery poop. °· Only take medicine as told by your doctor. °· Take a warm bath to help lessen burning or pain from having watery poop. °GET HELP RIGHT AWAY IF:  °· You cannot drink fluids without throwing up (vomiting). °· You keep throwing up. °· You have blood in your poop, or your poop looks black and tarry. °· You do not pee (urinate) in 6-8 hours, or there is only a small amount of very dark pee. °· You have belly (abdominal) pain that gets worse or stays  in the same spot (localizes). °· You are weak, dizzy, confused, or light-headed. °· You have a very bad headache. °· Your watery poop gets worse or does not get better. °· You have a fever or lasting symptoms for more than 2-3 days. °· You have a fever and your symptoms suddenly get worse. °MAKE SURE YOU:  °· Understand these instructions. °· Will watch your condition. °· Will get help right away if you are not doing well or get worse. °  °This information is not intended to replace advice given to you by your health care provider. Make sure you discuss any questions you have with your health care provider. °  °Document Released: 03/27/2008 Document Revised: 10/30/2014 Document Reviewed: 06/16/2012 °Elsevier Interactive Patient Education ©2016 [employer]. ° °Nausea and Vomiting °Nausea is a sick feeling that often comes before throwing up (vomiting). Vomiting is a reflex where stomach contents come out of your mouth. Vomiting can cause severe loss of body fluids (dehydration). Children and elderly adults can become dehydrated quickly, especially if they also have diarrhea. Nausea and vomiting are symptoms of a condition or disease. It is important to find the cause of your symptoms. °CAUSES  °· Direct irritation of the stomach lining. This irritation can result from increased acid production (gastroesophageal reflux disease), infection, food poisoning, taking certain medicines (such as nonsteroidal anti-inflammatory drugs), alcohol use, or tobacco use. °· Signals from the brain. These signals could be caused by a headache, heat   exposure, an inner ear disturbance, increased pressure in the brain from injury, infection, a tumor, or a concussion, pain, emotional stimulus, or metabolic problems. °· An obstruction in the gastrointestinal tract (bowel obstruction). °· Illnesses such as diabetes, hepatitis, gallbladder problems, appendicitis, kidney problems, cancer, sepsis, atypical symptoms of a heart attack, or eating  disorders. °· Medical treatments such as chemotherapy and radiation. °· Receiving medicine that makes you sleep (general anesthetic) during surgery. °DIAGNOSIS °Your caregiver may ask for tests to be done if the problems do not improve after a few days. Tests may also be done if symptoms are severe or if the reason for the nausea and vomiting is not clear. Tests may include: °· Urine tests. °· Blood tests. °· Stool tests. °· Cultures (to look for evidence of infection). °· X-rays or other imaging studies. °Test results can help your caregiver make decisions about treatment or the need for additional tests. °TREATMENT °You need to stay well hydrated. Drink frequently but in small amounts. You may wish to drink water, sports drinks, clear broth, or eat frozen ice pops or gelatin dessert to help stay hydrated. When you eat, eating slowly may help prevent nausea. There are also some antinausea medicines that may help prevent nausea. °HOME CARE INSTRUCTIONS  °· Take all medicine as directed by your caregiver. °· If you do not have an appetite, do not force yourself to eat. However, you must continue to drink fluids. °· If you have an appetite, eat a normal diet unless your caregiver tells you differently. °¨ Eat a variety of complex carbohydrates (rice, wheat, potatoes, bread), lean meats, yogurt, fruits, and vegetables. °¨ Avoid high-fat foods because they are more difficult to digest. °· Drink enough water and fluids to keep your urine clear or pale yellow. °· If you are dehydrated, ask your caregiver for specific rehydration instructions. Signs of dehydration may include: °¨ Severe thirst. °¨ Dry lips and mouth. °¨ Dizziness. °¨ Dark urine. °¨ Decreasing urine frequency and amount. °¨ Confusion. °¨ Rapid breathing or pulse. °SEEK IMMEDIATE MEDICAL CARE IF:  °· You have blood or brown flecks (like coffee grounds) in your vomit. °· You have black or bloody stools. °· You have a severe headache or stiff neck. °· You are  confused. °· You have severe abdominal pain. °· You have chest pain or trouble breathing. °· You do not urinate at least once every 8 hours. °· You develop cold or clammy skin. °· You continue to vomit for longer than 24 to 48 hours. °· You have a fever. °MAKE SURE YOU:  °· Understand these instructions. °· Will watch your condition. °· Will get help right away if you are not doing well or get worse. °  °This information is not intended to replace advice given to you by your health care provider. Make sure you discuss any questions you have with your health care provider. °  °Document Released: 10/09/2005 Document Revised: 01/01/2012 Document Reviewed: 03/08/2011 °Elsevier Interactive Patient Education ©2016 Elsevier Inc. ° °

## 2015-10-27 NOTE — ED Notes (Signed)
Discharge instructions, follow up care, and rx x2 reviewed with patient. Patient verbalized understanding. 

## 2015-11-04 ENCOUNTER — Emergency Department (HOSPITAL_COMMUNITY): Payer: Medicaid Other

## 2015-11-04 ENCOUNTER — Emergency Department (HOSPITAL_COMMUNITY)
Admission: EM | Admit: 2015-11-04 | Discharge: 2015-11-04 | Disposition: A | Discharge status: Home / Self Care | Payer: Medicaid Other | Attending: Emergency Medicine | Admitting: Emergency Medicine

## 2015-11-04 ENCOUNTER — Encounter (HOSPITAL_COMMUNITY): Payer: Self-pay | Admitting: Emergency Medicine

## 2015-11-04 DIAGNOSIS — R1011 Right upper quadrant pain: Secondary | ICD-10-CM | POA: Diagnosis present

## 2015-11-04 DIAGNOSIS — Z8742 Personal history of other diseases of the female genital tract: Secondary | ICD-10-CM | POA: Diagnosis not present

## 2015-11-04 DIAGNOSIS — Z8619 Personal history of other infectious and parasitic diseases: Secondary | ICD-10-CM | POA: Insufficient documentation

## 2015-11-04 DIAGNOSIS — R140 Abdominal distension (gaseous): Secondary | ICD-10-CM | POA: Insufficient documentation

## 2015-11-04 DIAGNOSIS — Z3202 Encounter for pregnancy test, result negative: Secondary | ICD-10-CM | POA: Diagnosis not present

## 2015-11-04 DIAGNOSIS — R112 Nausea with vomiting, unspecified: Secondary | ICD-10-CM | POA: Insufficient documentation

## 2015-11-04 DIAGNOSIS — Z8719 Personal history of other diseases of the digestive system: Secondary | ICD-10-CM | POA: Insufficient documentation

## 2015-11-04 DIAGNOSIS — Z862 Personal history of diseases of the blood and blood-forming organs and certain disorders involving the immune mechanism: Secondary | ICD-10-CM | POA: Insufficient documentation

## 2015-11-04 DIAGNOSIS — R197 Diarrhea, unspecified: Secondary | ICD-10-CM | POA: Insufficient documentation

## 2015-11-04 DIAGNOSIS — Z8659 Personal history of other mental and behavioral disorders: Secondary | ICD-10-CM | POA: Insufficient documentation

## 2015-11-04 DIAGNOSIS — R1110 Vomiting, unspecified: Secondary | ICD-10-CM

## 2015-11-04 DIAGNOSIS — M549 Dorsalgia, unspecified: Secondary | ICD-10-CM | POA: Diagnosis not present

## 2015-11-04 DIAGNOSIS — E119 Type 2 diabetes mellitus without complications: Secondary | ICD-10-CM | POA: Insufficient documentation

## 2015-11-04 DIAGNOSIS — F17210 Nicotine dependence, cigarettes, uncomplicated: Secondary | ICD-10-CM | POA: Diagnosis not present

## 2015-11-04 DIAGNOSIS — R195 Other fecal abnormalities: Secondary | ICD-10-CM | POA: Diagnosis not present

## 2015-11-04 LAB — CBC
HEMATOCRIT: 34.1 % — AB (ref 36.0–46.0)
HEMOGLOBIN: 10.9 g/dL — AB (ref 12.0–15.0)
MCH: 25.6 pg — ABNORMAL LOW (ref 26.0–34.0)
MCHC: 32 g/dL (ref 30.0–36.0)
MCV: 80.2 fL (ref 78.0–100.0)
Platelets: 282 10*3/uL (ref 150–400)
RBC: 4.25 MIL/uL (ref 3.87–5.11)
RDW: 14.7 % (ref 11.5–15.5)
WBC: 5.4 10*3/uL (ref 4.0–10.5)

## 2015-11-04 LAB — COMPREHENSIVE METABOLIC PANEL
ALBUMIN: 3.5 g/dL (ref 3.5–5.0)
ALK PHOS: 65 U/L (ref 38–126)
ALT: 18 U/L (ref 14–54)
ANION GAP: 8 (ref 5–15)
AST: 17 U/L (ref 15–41)
BILIRUBIN TOTAL: 0.5 mg/dL (ref 0.3–1.2)
BUN: 9 mg/dL (ref 6–20)
CALCIUM: 8.8 mg/dL — AB (ref 8.9–10.3)
CO2: 26 mmol/L (ref 22–32)
Chloride: 111 mmol/L (ref 101–111)
Creatinine, Ser: 0.72 mg/dL (ref 0.44–1.00)
GFR calc Af Amer: >60 mL/min (ref >60)
GLUCOSE: 100 mg/dL — AB (ref 65–99)
Potassium: 3.5 mmol/L (ref 3.5–5.1)
Sodium: 145 mmol/L (ref 135–145)
TOTAL PROTEIN: 6.7 g/dL (ref 6.5–8.1)

## 2015-11-04 LAB — URINALYSIS, ROUTINE W REFLEX MICROSCOPIC
Bilirubin Urine: NEGATIVE
Glucose, UA: NEGATIVE mg/dL
Hgb urine dipstick: NEGATIVE
KETONES UR: NEGATIVE mg/dL
NITRITE: NEGATIVE
PH: 7 (ref 5.0–8.0)
Protein, ur: NEGATIVE mg/dL
Specific Gravity, Urine: 1.025 (ref 1.005–1.030)

## 2015-11-04 LAB — URINE MICROSCOPIC-ADD ON

## 2015-11-04 LAB — POC URINE PREG, ED: PREG TEST UR: NEGATIVE

## 2015-11-04 LAB — LIPASE, BLOOD: Lipase: 28 U/L (ref 11–51)

## 2015-11-04 MED ORDER — ONDANSETRON 4 MG PO TBDP: 4.0000 mg | ORAL_TABLET | Freq: Three times a day (TID) | ORAL | Status: DC | PRN

## 2015-11-04 MED ORDER — DICYCLOMINE HCL 10 MG PO CAPS
10.0000 mg | ORAL_CAPSULE | Freq: Once | ORAL | Status: AC
  Administered 2015-11-04: 10 mg via ORAL
  Filled 2015-11-04: qty 1

## 2015-11-04 MED ORDER — ONDANSETRON HCL 4 MG/2ML IJ SOLN
4.0000 mg | Freq: Once | INTRAMUSCULAR | Status: AC
  Administered 2015-11-04: 4 mg via INTRAVENOUS
  Filled 2015-11-04: qty 2

## 2015-11-04 MED ORDER — SODIUM CHLORIDE 0.9 % IV BOLUS (SEPSIS)
1000.0000 mL | Freq: Once | INTRAVENOUS | Status: AC
  Administered 2015-11-04: 1000 mL via INTRAVENOUS

## 2015-11-04 MED ORDER — DICYCLOMINE HCL 20 MG PO TABS: 20.0000 mg | ORAL_TABLET | Freq: Two times a day (BID) | ORAL | Status: DC | PRN

## 2015-11-04 MED ORDER — MORPHINE SULFATE (PF) 4 MG/ML IV SOLN
4.0000 mg | Freq: Once | INTRAVENOUS | Status: AC
  Administered 2015-11-04: 4 mg via INTRAVENOUS
  Filled 2015-11-04: qty 1

## 2015-11-04 NOTE — ED Notes (Signed)
ptreports N/V/D for several days. Pt was seen at [facility] for the same. Pt reports pain and tenderness to palpation in RUQ.

## 2015-11-04 NOTE — ED Provider Notes (Signed)
CSN: [id number]     Arrival date & time 11/04/15  0841 History   First MD Initiated Contact with Patient 11/04/15 1135     Chief Complaint  Patient presents with  . Abdominal Pain     (Consider location/radiation/quality/duration/timing/severity/associated sxs/prior Treatment) HPI She has been evaluated multiple times in the emergency departments of [place], [facility] and [facility] or abdominal pain. Was seen at [facility] on 1/4 for same complaint. States she's had one week of right upper quadrant abdominal pain. She's had multiple episodes of vomiting and diarrhea. States she noticed a small amount of red blood in her stool. She's had no fever or chills. Patient has history of previous appendectomy and irritable bowel syndrome. Denies any pelvic complaints including discharge or bleeding. Does have some mild right flank tenderness associated with the right upper quadrant pain. Decreased by mouth intake due to vomiting and pain. Past Medical History  Diagnosis Date  . Ovarian cyst   . Peritonitis, acute generalized ([place])   . Sepsis ([place])   . Anxiety   . Anemia   . GERD (gastroesophageal reflux disease)     has resolved  . IBS (irritable bowel syndrome)   . Diabetes mellitus without complication [facility])    Past Surgical History  Procedure Laterality Date  . Cesarean section    . Ovarian cyst drainage    . Appendectomy      ruptured    No family history on file. Social History  Substance Use Topics  . Smoking status: Current Every Day Smoker -- 0.50 packs/day    Types: Cigarettes  . Smokeless tobacco: Never Used  . Alcohol Use: Yes     Comment: occassional   OB History    Gravida Para Term Preterm AB TAB SAB Ectopic Multiple Living   2 2 2       2      Review of Systems  Constitutional: Negative for fever and chills.  Respiratory: Negative for shortness of breath.   Cardiovascular: Negative for chest pain.  Gastrointestinal: Positive for nausea, vomiting,  abdominal pain, diarrhea, blood in stool and abdominal distention.  Genitourinary: Positive for flank pain. Negative for dysuria, vaginal bleeding, vaginal discharge and pelvic pain.  Musculoskeletal: Positive for back pain. Negative for neck pain and neck stiffness.  Skin: Negative for rash and wound.  Neurological: Negative for dizziness, weakness, light-headedness, numbness and headaches.  All other systems reviewed and are negative.     Allergies  Contrast media  Home Medications   Prior to Admission medications   Medication Sig Start Date End Date Taking? Authorizing Provider  dicyclomine (BENTYL) 20 MG tablet Take 1 tablet (20 mg total) by mouth 2 (two) times daily as needed for spasms. 11/04/15   Julianne Rice, MD  diphenoxylate-atropine (LOMOTIL) 2.5-0.025 MG tablet Take 1 tablet by mouth 4 (four) times daily as needed for diarrhea or loose stools. Patient not taking: Reported on 11/04/2015 10/27/15   Tanna Furry, MD  diphenoxylate-atropine (LOMOTIL) 2.5-0.025 MG tablet Take 1 tablet by mouth 4 (four) times daily as needed for diarrhea or loose stools. Patient not taking: Reported on 11/04/2015 10/27/15   Tanna Furry, MD  docusate sodium (COLACE) 100 MG capsule Take 1 capsule (100 mg total) by mouth every 12 (twelve) hours. Patient not taking: Reported on 10/27/2015 09/08/15   Orpah Greek, MD  methocarbamol (ROBAXIN) 500 MG tablet Take 1 tablet (500 mg total) by mouth 2 (two) times daily. Patient not taking: Reported on 10/27/2015  08/03/15   John Molpus, MD  ondansetron (ZOFRAN ODT) 4 MG disintegrating tablet Take 1 tablet (4 mg total) by mouth every 8 (eight) hours as needed for nausea. 11/04/15   Julianne Rice, MD  oxyCODONE-acetaminophen (PERCOCET/ROXICET) 5-325 MG tablet Take 1 tablet by mouth every 4 (four) hours as needed for severe pain. Patient not taking: Reported on 09/07/2015 08/03/15   Shanon Rosser, MD  polyethylene glycol (MIRALAX / GLYCOLAX) packet Take 17 g by mouth  daily as needed for moderate constipation. Patient not taking: Reported on 10/27/2015 09/08/15   Orpah Greek, MD   BP 117/73 mmHg  Pulse 71  Temp(Src) 98 F (36.7 C) (Oral)  Resp 18  Ht 5\' 7"  (1.702 m)  Wt 210 lb (95.255 kg)  BMI 32.88 kg/m2  SpO2 99%  LMP 10/16/2015 Physical Exam  Constitutional: She is oriented to person, place, and time. She appears well-developed and well-nourished. No distress.  Patient is resting comfortably without any obvious discomfort  HENT:  Head: Normocephalic and atraumatic.  Mouth/Throat: Oropharynx is clear and moist. No oropharyngeal exudate.  Moist membranes  Eyes: EOM are normal. Pupils are equal, round, and reactive to light.  Neck: Normal range of motion. Neck supple.  Cardiovascular: Normal rate and regular rhythm.   Pulmonary/Chest: Effort normal and breath sounds normal. No respiratory distress. She has no wheezes. She has no rales. She exhibits no tenderness.  Abdominal: Soft. Bowel sounds are normal. She exhibits no distension and no mass. There is tenderness. There is no rebound and no guarding.  Patient with right upper quadrant pain with palpation. There is some mild generalized pain seems constant. In the right upper quadrant. There is no rebound or guarding. Patient without any apparent discomfort with palpation during auscultation.  Musculoskeletal: Normal range of motion. She exhibits no edema or tenderness.  Mild right CVA tenderness.  Neurological: She is alert and oriented to person, place, and time.  Moves all extremities without deficit. Sensation is fully intact.  Skin: Skin is warm and dry. No rash noted. No erythema.  Psychiatric: She has a normal mood and affect. Her behavior is normal.  Nursing note and vitals reviewed.   ED Course  Procedures (including critical care time) Labs Review Labs Reviewed  COMPREHENSIVE METABOLIC PANEL - Abnormal; Notable for the following:    Glucose, Bld 100 (*)    Calcium 8.8 (*)     All other components within normal limits  CBC - Abnormal; Notable for the following:    Hemoglobin 10.9 (*)    HCT 34.1 (*)    MCH 25.6 (*)    All other components within normal limits  URINALYSIS, ROUTINE W REFLEX MICROSCOPIC (NOT AT [facility]) - Abnormal; Notable for the following:    Leukocytes, UA SMALL (*)    All other components within normal limits  URINE MICROSCOPIC-ADD ON - Abnormal; Notable for the following:    Squamous Epithelial / LPF 0-5 (*)    Bacteria, UA FEW (*)    All other components within normal limits  LIPASE, BLOOD  POC URINE PREG, ED    Imaging Review US Abdomen Limited  11/04/2015  CLINICAL DATA:  Right upper quadrant pain for 1 week, nausea and vomiting EXAM: US ABDOMEN LIMITED - RIGHT UPPER QUADRANT COMPARISON:  09/08/2015 FINDINGS: Gallbladder: No gallstones or wall thickening visualized. No sonographic Murphy sign noted by sonographer. Common bile duct: Diameter: 4.2 mm in diameter within normal limits. Liver: No focal lesion identified. Within normal limits in parenchymal echogenicity. IMPRESSION:  Unremarkable right upper quadrant ultrasound. Electronically Signed   By: Lahoma Crocker M.D.   On: 11/04/2015 13:09   I have personally reviewed and evaluated these images and lab results as part of my medical decision-making.   EKG Interpretation None      MDM   Final diagnoses:  Vomiting and diarrhea  Right upper quadrant pain    Patient presents with chronic abdominal pain, vomiting and diarrhea. Negative for the same roughly one week ago Elvina Sidle. Benign abdominal exam. Patient has multiple CT scans without definitive findings. This been several years since the patient has had an ultrasound of her gallbladder. We'll get that here in the emergency department. Laboratory exam without electrolyte abnormality, LFT or lipase elevation. Normal white blood cell count.  History without evidence of dehydration. She has a normal ultrasound. She is tolerating oral  intake. Do not believe further workup is necessary. We'll discharge home and give follow-up with gastroenterology. She's been given return precautions and is voiced understanding.  Julianne Rice, MD 11/04/15 1420

## 2015-11-04 NOTE — ED Notes (Signed)
MD at bedside. 

## 2015-11-04 NOTE — ED Notes (Signed)
Patient transported to Ultrasound in nad.

## 2015-11-04 NOTE — Discharge Instructions (Signed)
Abdominal Pain, Adult °Many things can cause abdominal pain. Usually, abdominal pain is not caused by a disease and will improve without treatment. It can often be observed and treated at home. Your health care provider will do a physical exam and possibly order blood tests and X-rays to help determine the seriousness of your pain. However, in many cases, more time must pass before a clear cause of the pain can be found. Before that point, your health care provider may not know if you need more testing or further treatment. °HOME CARE INSTRUCTIONS °Monitor your abdominal pain for any changes. The following actions may help to alleviate any discomfort you are experiencing: °· Only take over-the-counter or prescription medicines as directed by your health care provider. °· Do not take laxatives unless directed to do so by your health care provider. °· Try a clear liquid diet (broth, tea, or water) as directed by your health care provider. Slowly move to a bland diet as tolerated. °SEEK MEDICAL CARE IF: °· You have unexplained abdominal pain. °· You have abdominal pain associated with nausea or diarrhea. °· You have pain when you urinate or have a bowel movement. °· You experience abdominal pain that wakes you in the night. °· You have abdominal pain that is worsened or improved by eating food. °· You have abdominal pain that is worsened with eating fatty foods. °· You have a fever. °SEEK IMMEDIATE MEDICAL CARE IF: °· Your pain does not go away within 2 hours. °· You keep throwing up (vomiting). °· Your pain is felt only in portions of the abdomen, such as the right side or the left lower portion of the abdomen. °· You pass bloody or black tarry stools. °MAKE SURE YOU: °· Understand these instructions. °· Will watch your condition. °· Will get help right away if you are not doing well or get worse. °  °This information is not intended to replace advice given to you by your health care provider. Make sure you discuss  any questions you have with your health care provider. °  °Document Released: 07/19/2005 Document Revised: 06/30/2015 Document Reviewed: 06/18/2013 °Elsevier Interactive Patient Education ©2016 Elsevier Inc. ° °Nausea and Vomiting °Nausea is a sick feeling that often comes before throwing up (vomiting). Vomiting is a reflex where stomach contents come out of your mouth. Vomiting can cause severe loss of body fluids (dehydration). Children and elderly adults can become dehydrated quickly, especially if they also have diarrhea. Nausea and vomiting are symptoms of a condition or disease. It is important to find the cause of your symptoms. °CAUSES  °· Direct irritation of the stomach lining. This irritation can result from increased acid production (gastroesophageal reflux disease), infection, food poisoning, taking certain medicines (such as nonsteroidal anti-inflammatory drugs), alcohol use, or tobacco use. °· Signals from the brain. These signals could be caused by a headache, heat exposure, an inner ear disturbance, increased pressure in the brain from injury, infection, a tumor, or a concussion, pain, emotional stimulus, or metabolic problems. °· An obstruction in the gastrointestinal tract (bowel obstruction). °· Illnesses such as diabetes, hepatitis, gallbladder problems, appendicitis, kidney problems, cancer, sepsis, atypical symptoms of a heart attack, or eating disorders. °· Medical treatments such as chemotherapy and radiation. °· Receiving medicine that makes you sleep (general anesthetic) during surgery. °DIAGNOSIS °Your caregiver may ask for tests to be done if the problems do not improve after a few days. Tests may also be done if symptoms are severe or if the reason for the   nausea and vomiting is not clear. Tests may include: °· Urine tests. °· Blood tests. °· Stool tests. °· Cultures (to look for evidence of infection). °· X-rays or other imaging studies. °Test results can help your caregiver make  decisions about treatment or the need for additional tests. °TREATMENT °You need to stay well hydrated. Drink frequently but in small amounts. You may wish to drink water, sports drinks, clear broth, or eat frozen ice pops or gelatin dessert to help stay hydrated. When you eat, eating slowly may help prevent nausea. There are also some antinausea medicines that may help prevent nausea. °HOME CARE INSTRUCTIONS  °· Take all medicine as directed by your caregiver. °· If you do not have an appetite, do not force yourself to eat. However, you must continue to drink fluids. °· If you have an appetite, eat a normal diet unless your caregiver tells you differently. °¨ Eat a variety of complex carbohydrates (rice, wheat, potatoes, bread), lean meats, yogurt, fruits, and vegetables. °¨ Avoid high-fat foods because they are more difficult to digest. °· Drink enough water and fluids to keep your urine clear or pale yellow. °· If you are dehydrated, ask your caregiver for specific rehydration instructions. Signs of dehydration may include: °¨ Severe thirst. °¨ Dry lips and mouth. °¨ Dizziness. °¨ Dark urine. °¨ Decreasing urine frequency and amount. °¨ Confusion. °¨ Rapid breathing or pulse. °SEEK IMMEDIATE MEDICAL CARE IF:  °· You have blood or brown flecks (like coffee grounds) in your vomit. °· You have black or bloody stools. °· You have a severe headache or stiff neck. °· You are confused. °· You have severe abdominal pain. °· You have chest pain or trouble breathing. °· You do not urinate at least once every 8 hours. °· You develop cold or clammy skin. °· You continue to vomit for longer than 24 to 48 hours. °· You have a fever. °MAKE SURE YOU:  °· Understand these instructions. °· Will watch your condition. °· Will get help right away if you are not doing well or get worse. °  °This information is not intended to replace advice given to you by your health care provider. Make sure you discuss any questions you have with  your health care provider. °  °Document Released: 10/09/2005 Document Revised: 01/01/2012 Document Reviewed: 03/08/2011 °Elsevier Interactive Patient Education ©2016 Elsevier Inc. ° °

## 2015-11-04 NOTE — ED Notes (Signed)
Patient states abdominal pain since last week.  Patient states N/V/D.   Patient states she was told she needed to have gallbladder out approximately 4 months ago, but she didn't have it out because of a move to [place] from [place].   Patient denies other symptoms.

## 2016-01-16 ENCOUNTER — Emergency Department (HOSPITAL_BASED_OUTPATIENT_CLINIC_OR_DEPARTMENT_OTHER)
Admission: EM | Admit: 2016-01-16 | Discharge: 2016-01-16 | Disposition: A | Discharge status: Home / Self Care | Payer: Medicaid Other | Attending: Emergency Medicine | Admitting: Emergency Medicine

## 2016-01-16 ENCOUNTER — Encounter (HOSPITAL_BASED_OUTPATIENT_CLINIC_OR_DEPARTMENT_OTHER): Payer: Self-pay | Admitting: Emergency Medicine

## 2016-01-16 ENCOUNTER — Emergency Department (HOSPITAL_BASED_OUTPATIENT_CLINIC_OR_DEPARTMENT_OTHER): Payer: Medicaid Other

## 2016-01-16 DIAGNOSIS — R112 Nausea with vomiting, unspecified: Secondary | ICD-10-CM | POA: Diagnosis not present

## 2016-01-16 DIAGNOSIS — Z8619 Personal history of other infectious and parasitic diseases: Secondary | ICD-10-CM | POA: Diagnosis not present

## 2016-01-16 DIAGNOSIS — R197 Diarrhea, unspecified: Secondary | ICD-10-CM | POA: Diagnosis not present

## 2016-01-16 DIAGNOSIS — E119 Type 2 diabetes mellitus without complications: Secondary | ICD-10-CM | POA: Insufficient documentation

## 2016-01-16 DIAGNOSIS — N939 Abnormal uterine and vaginal bleeding, unspecified: Secondary | ICD-10-CM | POA: Insufficient documentation

## 2016-01-16 DIAGNOSIS — R1084 Generalized abdominal pain: Secondary | ICD-10-CM | POA: Diagnosis present

## 2016-01-16 DIAGNOSIS — K921 Melena: Secondary | ICD-10-CM | POA: Diagnosis not present

## 2016-01-16 DIAGNOSIS — Z8659 Personal history of other mental and behavioral disorders: Secondary | ICD-10-CM | POA: Diagnosis not present

## 2016-01-16 DIAGNOSIS — F17210 Nicotine dependence, cigarettes, uncomplicated: Secondary | ICD-10-CM | POA: Diagnosis not present

## 2016-01-16 DIAGNOSIS — Z791 Long term (current) use of non-steroidal anti-inflammatories (NSAID): Secondary | ICD-10-CM | POA: Diagnosis not present

## 2016-01-16 DIAGNOSIS — K219 Gastro-esophageal reflux disease without esophagitis: Secondary | ICD-10-CM | POA: Insufficient documentation

## 2016-01-16 DIAGNOSIS — Z862 Personal history of diseases of the blood and blood-forming organs and certain disorders involving the immune mechanism: Secondary | ICD-10-CM | POA: Insufficient documentation

## 2016-01-16 LAB — CBC WITH DIFFERENTIAL/PLATELET
BASOS PCT: 0 %
Basophils Absolute: 0 10*3/uL (ref 0.0–0.1)
EOS ABS: 0.2 10*3/uL (ref 0.0–0.7)
EOS PCT: 4 %
HCT: 33.1 % — ABNORMAL LOW (ref 36.0–46.0)
HEMOGLOBIN: 10.7 g/dL — AB (ref 12.0–15.0)
Lymphocytes Relative: 18 %
Lymphs Abs: 1 10*3/uL (ref 0.7–4.0)
MCH: 26 pg (ref 26.0–34.0)
MCHC: 32.3 g/dL (ref 30.0–36.0)
MCV: 80.5 fL (ref 78.0–100.0)
MONOS PCT: 6 %
Monocytes Absolute: 0.3 10*3/uL (ref 0.1–1.0)
NEUTROS PCT: 72 %
Neutro Abs: 4 10*3/uL (ref 1.7–7.7)
PLATELETS: 278 10*3/uL (ref 150–400)
RBC: 4.11 MIL/uL (ref 3.87–5.11)
RDW: 15.2 % (ref 11.5–15.5)
WBC: 5.6 10*3/uL (ref 4.0–10.5)

## 2016-01-16 LAB — URINE MICROSCOPIC-ADD ON

## 2016-01-16 LAB — COMPREHENSIVE METABOLIC PANEL
ALK PHOS: 58 U/L (ref 38–126)
ALT: 12 U/L — AB (ref 14–54)
AST: 15 U/L (ref 15–41)
Albumin: 3.6 g/dL (ref 3.5–5.0)
Anion gap: 5 (ref 5–15)
BUN: 18 mg/dL (ref 6–20)
CALCIUM: 8.5 mg/dL — AB (ref 8.9–10.3)
CO2: 24 mmol/L (ref 22–32)
CREATININE: 0.96 mg/dL (ref 0.44–1.00)
Chloride: 108 mmol/L (ref 101–111)
Glucose, Bld: 105 mg/dL — ABNORMAL HIGH (ref 65–99)
Potassium: 3.2 mmol/L — ABNORMAL LOW (ref 3.5–5.1)
Sodium: 137 mmol/L (ref 135–145)
Total Bilirubin: 0.5 mg/dL (ref 0.3–1.2)
Total Protein: 6.9 g/dL (ref 6.5–8.1)

## 2016-01-16 LAB — URINALYSIS, ROUTINE W REFLEX MICROSCOPIC
BILIRUBIN URINE: NEGATIVE
GLUCOSE, UA: NEGATIVE mg/dL
HGB URINE DIPSTICK: NEGATIVE
Ketones, ur: 15 mg/dL — AB
Nitrite: POSITIVE — AB
PH: 7 (ref 5.0–8.0)
Protein, ur: 30 mg/dL — AB
SPECIFIC GRAVITY, URINE: 1.041 — AB (ref 1.005–1.030)

## 2016-01-16 LAB — WET PREP, GENITAL
Sperm: NONE SEEN
Trich, Wet Prep: NONE SEEN
Yeast Wet Prep HPF POC: NONE SEEN

## 2016-01-16 LAB — LIPASE, BLOOD: LIPASE: 19 U/L (ref 11–51)

## 2016-01-16 LAB — PREGNANCY, URINE: Preg Test, Ur: NEGATIVE

## 2016-01-16 MED ORDER — SODIUM CHLORIDE 0.9 % IV BOLUS (SEPSIS)
2000.0000 mL | Freq: Once | INTRAVENOUS | Status: AC
  Administered 2016-01-16 (×2): 1000 mL via INTRAVENOUS

## 2016-01-16 MED ORDER — METRONIDAZOLE 500 MG PO TABS: 500.0000 mg | ORAL_TABLET | Freq: Two times a day (BID) | ORAL | Status: DC

## 2016-01-16 MED ORDER — POTASSIUM CHLORIDE CRYS ER 20 MEQ PO TBCR
40.0000 meq | EXTENDED_RELEASE_TABLET | Freq: Once | ORAL | Status: AC
  Administered 2016-01-16: 40 meq via ORAL
  Filled 2016-01-16: qty 2

## 2016-01-16 MED ORDER — MORPHINE SULFATE (PF) 4 MG/ML IV SOLN
4.0000 mg | Freq: Once | INTRAVENOUS | Status: AC
Start: 2016-01-16 — End: 2016-01-16
  Administered 2016-01-16: 4 mg via INTRAVENOUS
  Filled 2016-01-16: qty 1

## 2016-01-16 MED ORDER — METOCLOPRAMIDE HCL 10 MG PO TABS: 10.0000 mg | ORAL_TABLET | Freq: Four times a day (QID) | ORAL | Status: DC | PRN

## 2016-01-16 MED ORDER — METOCLOPRAMIDE HCL 5 MG/ML IJ SOLN
10.0000 mg | Freq: Once | INTRAMUSCULAR | Status: AC
  Administered 2016-01-16: 10 mg via INTRAVENOUS
  Filled 2016-01-16: qty 2

## 2016-01-16 NOTE — ED Notes (Signed)
Pt reports that she is on pain management and has contract, pt was started on latuda march 1st for bipolar dz. However md told her to stop ibuprofen secondary to side effects of latuda,  '

## 2016-01-16 NOTE — ED Notes (Signed)
Pt reports diffuse abdominal pain that started 2 days ago dark urine that she reports as bloody looking, urine is amber, denies dysuria no increase in frequency of urination, pt does report remote history of uturine cyst, lmp 3/10

## 2016-01-16 NOTE — ED Provider Notes (Addendum)
CSN: [id number]     Arrival date & time 01/16/16  1516 History   By signing my name below, I, Nicole Kindred, attest that this documentation has been prepared under the direction and in the presence of Orlie Dakin, MD.   Electronically Signed: Nicole Kindred, ED Scribe. 01/16/2016. 3:31 PM  Chief Complaint  Patient presents with  . Abdominal Pain    The history is provided by the patient. No language interpreter was used.   HPI Comments: Jodi Mcgee is a 30 y.o. female with PMHx of DM without complication and IBS, and PSHx of appendectomy and ovarian cyst drainage who presents to the Emergency Department complaining of gradual onset, constant, generalized abdominal pain, onset two days ago. Pain is worst in the upper portion of her abdomen. She states she has had abdominal pain like this in the past. Pt reports associated rectal and vaginal bleeding, yellow colored emesis x10-11 episodes, and decreased appetite. Pt has not eaten since yesterday. She has not vomited since yesterday.She had 4 or 5 episodes of blood-streaked diarrhea yesterday. No other associated symptoms noted. Pt took aleve PTA with minimal relief to symptoms. No other worsening or alleviating factors noted. Pt denies hematemesis, or any other pertinent symptoms. LNMP was on 12/31/2015. Pt is a smoker and has hx of alcohol and marijuana use. No fever  Past Medical History  Diagnosis Date  . Ovarian cyst   . Peritonitis, acute generalized ([place])   . Sepsis ([place])   . Anxiety   . Anemia   . GERD (gastroesophageal reflux disease)     has resolved  . IBS (irritable bowel syndrome)   . Diabetes mellitus without complication [facility])    Past Surgical History  Procedure Laterality Date  . Cesarean section    . Ovarian cyst drainage    . Appendectomy      ruptured    History reviewed. No pertinent family history. Social History  Substance Use Topics  . Smoking status: Current Every Day Smoker -- 0.50 packs/day     Types: Cigarettes  . Smokeless tobacco: Never Used  . Alcohol Use: Yes     Comment: occassional  Positive marijuana use no IV drug use OB History    Gravida Para Term Preterm AB TAB SAB Ectopic Multiple Living   2 2 2       2      Review of Systems  Constitutional: Negative.   HENT: Negative.   Respiratory: Negative.   Cardiovascular: Negative.   Gastrointestinal: Positive for nausea, vomiting, abdominal pain, diarrhea and blood in stool.  Genitourinary: Positive for vaginal bleeding.       Last normal menstrual period approximately 2 weeks ago.  Musculoskeletal: Negative.   Skin: Negative.   Neurological: Negative.   Psychiatric/Behavioral: Negative.   All other systems reviewed and are negative.  Allergies  Contrast media  Home Medications   Prior to Admission medications   Medication Sig Start Date End Date Taking? Authorizing Provider  dicyclomine (BENTYL) 20 MG tablet Take 1 tablet (20 mg total) by mouth 2 (two) times daily as needed for spasms. 11/04/15  Yes Julianne Rice, MD  Lurasidone HCl (LATUDA PO) Take by mouth.   Yes Historical Provider, MD  naproxen (NAPROSYN) 500 MG tablet Take 500 mg by mouth 2 (two) times daily with a meal.   Yes Historical Provider, MD  ondansetron (ZOFRAN ODT) 4 MG disintegrating tablet Take 1 tablet (4 mg total) by mouth every 8 (eight) hours as needed for nausea. 11/04/15  Yes Julianne Rice, MD  diphenoxylate-atropine (LOMOTIL) 2.5-0.025 MG tablet Take 1 tablet by mouth 4 (four) times daily as needed for diarrhea or loose stools. Patient not taking: Reported on 11/04/2015 10/27/15   Tanna Furry, MD  diphenoxylate-atropine (LOMOTIL) 2.5-0.025 MG tablet Take 1 tablet by mouth 4 (four) times daily as needed for diarrhea or loose stools. Patient not taking: Reported on 11/04/2015 10/27/15   Tanna Furry, MD  docusate sodium (COLACE) 100 MG capsule Take 1 capsule (100 mg total) by mouth every 12 (twelve) hours. Patient not taking: Reported on 10/27/2015  09/08/15   Orpah Greek, MD  methocarbamol (ROBAXIN) 500 MG tablet Take 1 tablet (500 mg total) by mouth 2 (two) times daily. Patient not taking: Reported on 10/27/2015 08/03/15   Shanon Rosser, MD  oxyCODONE-acetaminophen (PERCOCET/ROXICET) 5-325 MG tablet Take 1 tablet by mouth every 4 (four) hours as needed for severe pain. Patient not taking: Reported on 09/07/2015 08/03/15   Shanon Rosser, MD  polyethylene glycol (MIRALAX / GLYCOLAX) packet Take 17 g by mouth daily as needed for moderate constipation. Patient not taking: Reported on 10/27/2015 09/08/15   Orpah Greek, MD   BP 126/83 mmHg  Pulse 102  Temp(Src) 98.1 F (36.7 C) (Oral)  Resp 20  SpO2 100%  LMP 12/31/2015 Physical Exam  Constitutional: She appears well-developed and well-nourished. No distress.  HENT:  Head: Normocephalic and atraumatic.  Eyes: Conjunctivae are normal. Pupils are equal, round, and reactive to light.  Neck: Neck supple. No tracheal deviation present. No thyromegaly present.  Cardiovascular: Normal rate and regular rhythm.   No murmur heard. Pulmonary/Chest: Effort normal and breath sounds normal.  Abdominal: Soft. Bowel sounds are normal. She exhibits no distension. There is no tenderness.  Infraumbilical surgical scar. Obese.  Genitourinary:  Pelvic exam no external lesion Dark blood in vaginal vault. Cervical os closed. No cervical motion tenderness no adnexal masses or tenderness Rectal exam brown stool with red streaks., I'm unable to determine if blood is contaminant from vaginal bleeding  Musculoskeletal: Normal range of motion. She exhibits no edema or tenderness.  Neurological: She is alert. Coordination normal.  Skin: Skin is warm and dry. No rash noted.  Psychiatric: She has a normal mood and affect.  Nursing note and vitals reviewed.   ED Course  Procedures (including critical care time) DIAGNOSTIC STUDIES: Oxygen Saturation is 100% on RA, normal by my interpretation.     COORDINATION OF CARE: 3:49 PM-Discussed treatment plan which includes urinalysis with pt at bedside and pt agreed to plan.   Labs Review Labs Reviewed - No data to display  Imaging Review No results found. I have personally reviewed and evaluated these images and lab results as part of my medical decision-making.   EKG Interpretation None     5:40 PM patient feels much improved after treatment with intravenous fluids, intravenous Reglan and intravenous morphine. She is no longer nauseated. Results for orders placed or performed during the hospital encounter of 01/16/16  Wet prep, genital  Result Value Ref Range   Yeast Wet Prep HPF POC NONE SEEN NONE SEEN   Trich, Wet Prep NONE SEEN NONE SEEN   Clue Cells Wet Prep HPF POC PRESENT (A) NONE SEEN   WBC, Wet Prep HPF POC MODERATE (A) NONE SEEN   Sperm NONE SEEN   Urinalysis, Routine w reflex microscopic (not at [facility])  Result Value Ref Range   Color, Urine YELLOW YELLOW   APPearance CLEAR CLEAR   Specific Gravity, Urine 1.041 (  H) 1.005 - 1.030   pH 7.0 5.0 - 8.0   Glucose, UA NEGATIVE NEGATIVE mg/dL   Hgb urine dipstick NEGATIVE NEGATIVE   Bilirubin Urine NEGATIVE NEGATIVE   Ketones, ur 15 (A) NEGATIVE mg/dL   Protein, ur 30 (A) NEGATIVE mg/dL   Nitrite POSITIVE (A) NEGATIVE   Leukocytes, UA TRACE (A) NEGATIVE  Pregnancy, urine  Result Value Ref Range   Preg Test, Ur NEGATIVE NEGATIVE  Comprehensive metabolic panel  Result Value Ref Range   Sodium 137 135 - 145 mmol/L   Potassium 3.2 (L) 3.5 - 5.1 mmol/L   Chloride 108 101 - 111 mmol/L   CO2 24 22 - 32 mmol/L   Glucose, Bld 105 (H) 65 - 99 mg/dL   BUN 18 6 - 20 mg/dL   Creatinine, Ser 0.96 0.44 - 1.00 mg/dL   Calcium 8.5 (L) 8.9 - 10.3 mg/dL   Total Protein 6.9 6.5 - 8.1 g/dL   Albumin 3.6 3.5 - 5.0 g/dL   AST 15 15 - 41 U/L   ALT 12 (L) 14 - 54 U/L   Alkaline Phosphatase 58 38 - 126 U/L   Total Bilirubin 0.5 0.3 - 1.2 mg/dL   GFR calc non Af Amer >60 >60 mL/min    GFR calc Af Amer >60 >60 mL/min   Anion gap 5 5 - 15  CBC with Differential/Platelet  Result Value Ref Range   WBC 5.6 4.0 - 10.5 K/uL   RBC 4.11 3.87 - 5.11 MIL/uL   Hemoglobin 10.7 (L) 12.0 - 15.0 g/dL   HCT 33.1 (L) 36.0 - 46.0 %   MCV 80.5 78.0 - 100.0 fL   MCH 26.0 26.0 - 34.0 pg   MCHC 32.3 30.0 - 36.0 g/dL   RDW 15.2 11.5 - 15.5 %   Platelets 278 150 - 400 K/uL   Neutrophils Relative % 72 %   Neutro Abs 4.0 1.7 - 7.7 K/uL   Lymphocytes Relative 18 %   Lymphs Abs 1.0 0.7 - 4.0 K/uL   Monocytes Relative 6 %   Monocytes Absolute 0.3 0.1 - 1.0 K/uL   Eosinophils Relative 4 %   Eosinophils Absolute 0.2 0.0 - 0.7 K/uL   Basophils Relative 0 %   Basophils Absolute 0.0 0.0 - 0.1 K/uL  Lipase, blood  Result Value Ref Range   Lipase 19 11 - 51 U/L  Urine microscopic-add on  Result Value Ref Range   Squamous Epithelial / LPF 0-5 (A) NONE SEEN   WBC, UA 0-5 0 - 5 WBC/hpf   RBC / HPF 6-30 0 - 5 RBC/hpf   Bacteria, UA MANY (A) NONE SEEN   Urine-Other MUCOUS PRESENT    Dg Abd Acute W/chest  01/16/2016  CLINICAL DATA:  Abdominal pain, vomiting for 3 days EXAM: DG ABDOMEN ACUTE W/ 1V CHEST COMPARISON:  CT abdomen and pelvis 09/08/2015 FINDINGS: Cardiomediastinal silhouette is unremarkable. No acute infiltrate or pleural effusion. No pulmonary edema. There is normal small bowel gas pattern. Some colonic stool and gas noted throughout the colon. No free abdominal air. IMPRESSION: No acute disease within chest. Normal small bowel gas pattern. Some colonic stool and gas noted throughout the colon. No free abdominal air. Electronically Signed   By: Lahoma Crocker M.D.   On: 01/16/2016 17:01    MDM  Patient suffers from chronic abdominal pain and was formerly followed in the pain clinic for chronic abdominal pain I suspect the patient has gastroenteritis with vomiting and diarrhea. She also has  dysfunctional uterine bleeding with last normal menstrual period approximately 2 weeks ago. She is  referred to the [facility] clinic. Plan prescriptions Reglan, Flagyl., Imodium as needed for diarrhea. Avoid dairy your anemia is chronic She received potassium 40 mg prior to discharge. Follow-up with PMD Dr.Pavlock if having significant pain or vomiting diarrhea continues in 48 hours. HIV , RPR , GC CHL pending Final diagnoses:  None  Diagnosis #1 nausea vomiting diarrhea #2 abdominal pain #3 hypokalemia #4 anemia #5 vaginitis   I personally performed the services described in this documentation, which was scribed in my presence. The recorded information has been reviewed and considered.   I personally performed the services described in this documentation, which was scribed in my presence. The recorded information has been reviewed and considered. Orlie Dakin, MD 01/16/16 [place], MD 01/16/16 [phone]

## 2016-01-16 NOTE — Discharge Instructions (Signed)
Call the women's clinic tomorrow to arrange for follow-up visit regarding abnormal vaginal bleeding. Make sure you drink at least six 8 ounce glasses of water or Gatorade each day to stay well hydrated. Take the medication prescribed as needed for nausea. Take the antibiotic(Flagyl or metronidazole) prescribed as you have a vaginal infection. Avoid alcohol. Avoid milk or foods containing milk such as cheese or ice cream while having diarrhea. Take Imodium as directed for diarrhea. See Dr.Pavlock if vomiting and diarrhea continues after the next 48 hours. Return if your condition worsens for any reason.

## 2016-01-18 LAB — GC/CHLAMYDIA PROBE AMP (~~LOC~~) NOT AT ARMC
Chlamydia: NEGATIVE
NEISSERIA GONORRHEA: NEGATIVE

## 2016-01-18 LAB — RPR: RPR Ser Ql: NONREACTIVE

## 2016-01-18 LAB — HIV ANTIBODY (ROUTINE TESTING W REFLEX): HIV Screen 4th Generation wRfx: NONREACTIVE

## 2016-01-30 ENCOUNTER — Encounter (HOSPITAL_COMMUNITY): Payer: Self-pay | Admitting: Nurse Practitioner

## 2016-01-30 ENCOUNTER — Emergency Department (HOSPITAL_COMMUNITY)
Admission: EM | Admit: 2016-01-30 | Discharge: 2016-01-31 | Disposition: A | Discharge status: Home / Self Care | Payer: Medicaid Other | Attending: Emergency Medicine | Admitting: Emergency Medicine

## 2016-01-30 DIAGNOSIS — E119 Type 2 diabetes mellitus without complications: Secondary | ICD-10-CM | POA: Insufficient documentation

## 2016-01-30 DIAGNOSIS — R0602 Shortness of breath: Secondary | ICD-10-CM | POA: Diagnosis present

## 2016-01-30 DIAGNOSIS — F17210 Nicotine dependence, cigarettes, uncomplicated: Secondary | ICD-10-CM | POA: Diagnosis not present

## 2016-01-30 NOTE — ED Notes (Signed)
Pt called 3X for room placement. No answer. Called X-ray pt was not there.

## 2016-01-30 NOTE — ED Notes (Addendum)
She c/o sob since waking this morning. States 'it feels like i cant breathe." she denies cough, pain. She has had nasal congestion and runny nose. She is alert and breathing easily

## 2016-01-30 NOTE — ED Notes (Signed)
Pt unable to locate x1.

## 2016-03-23 ENCOUNTER — Emergency Department (HOSPITAL_COMMUNITY): Payer: Self-pay

## 2016-03-23 ENCOUNTER — Emergency Department (HOSPITAL_COMMUNITY)
Admission: EM | Admit: 2016-03-23 | Discharge: 2016-03-23 | Disposition: A | Discharge status: Home / Self Care | Payer: Self-pay | Attending: Emergency Medicine | Admitting: Emergency Medicine

## 2016-03-23 ENCOUNTER — Encounter (HOSPITAL_COMMUNITY): Payer: Self-pay

## 2016-03-23 DIAGNOSIS — Z79899 Other long term (current) drug therapy: Secondary | ICD-10-CM | POA: Insufficient documentation

## 2016-03-23 DIAGNOSIS — R1011 Right upper quadrant pain: Secondary | ICD-10-CM | POA: Insufficient documentation

## 2016-03-23 DIAGNOSIS — R112 Nausea with vomiting, unspecified: Secondary | ICD-10-CM | POA: Insufficient documentation

## 2016-03-23 DIAGNOSIS — E119 Type 2 diabetes mellitus without complications: Secondary | ICD-10-CM | POA: Insufficient documentation

## 2016-03-23 DIAGNOSIS — F17200 Nicotine dependence, unspecified, uncomplicated: Secondary | ICD-10-CM | POA: Insufficient documentation

## 2016-03-23 DIAGNOSIS — Z7984 Long term (current) use of oral hypoglycemic drugs: Secondary | ICD-10-CM | POA: Insufficient documentation

## 2016-03-23 LAB — CBC WITH DIFFERENTIAL/PLATELET
Basophils Absolute: 0 10*3/uL (ref 0.0–0.1)
Basophils Relative: 0 %
EOS PCT: 3 %
Eosinophils Absolute: 0.2 10*3/uL (ref 0.0–0.7)
HEMATOCRIT: 37.1 % (ref 36.0–46.0)
Hemoglobin: 11.7 g/dL — ABNORMAL LOW (ref 12.0–15.0)
LYMPHS ABS: 1.4 10*3/uL (ref 0.7–4.0)
LYMPHS PCT: 28 %
MCH: 25.5 pg — AB (ref 26.0–34.0)
MCHC: 31.5 g/dL (ref 30.0–36.0)
MCV: 81 fL (ref 78.0–100.0)
Monocytes Absolute: 0.3 10*3/uL (ref 0.1–1.0)
Monocytes Relative: 6 %
Neutro Abs: 3.3 10*3/uL (ref 1.7–7.7)
Neutrophils Relative %: 63 %
PLATELETS: 282 10*3/uL (ref 150–400)
RBC: 4.58 MIL/uL (ref 3.87–5.11)
RDW: 15.2 % (ref 11.5–15.5)
WBC: 5.2 10*3/uL (ref 4.0–10.5)

## 2016-03-23 LAB — URINE MICROSCOPIC-ADD ON

## 2016-03-23 LAB — COMPREHENSIVE METABOLIC PANEL
ALBUMIN: 4 g/dL (ref 3.5–5.0)
ALK PHOS: 76 U/L (ref 38–126)
ALT: 14 U/L (ref 14–54)
AST: 15 U/L (ref 15–41)
Anion gap: 5 (ref 5–15)
BILIRUBIN TOTAL: 0.2 mg/dL — AB (ref 0.3–1.2)
BUN: 18 mg/dL (ref 6–20)
CALCIUM: 9 mg/dL (ref 8.9–10.3)
CO2: 26 mmol/L (ref 22–32)
CREATININE: 0.9 mg/dL (ref 0.44–1.00)
Chloride: 105 mmol/L (ref 101–111)
GFR calc Af Amer: >60 mL/min (ref >60)
GFR calc non Af Amer: >60 mL/min (ref >60)
GLUCOSE: 112 mg/dL — AB (ref 65–99)
POTASSIUM: 3.5 mmol/L (ref 3.5–5.1)
Sodium: 136 mmol/L (ref 135–145)
TOTAL PROTEIN: 8 g/dL (ref 6.5–8.1)

## 2016-03-23 LAB — URINALYSIS, ROUTINE W REFLEX MICROSCOPIC
Bilirubin Urine: NEGATIVE
GLUCOSE, UA: NEGATIVE mg/dL
KETONES UR: NEGATIVE mg/dL
LEUKOCYTES UA: NEGATIVE
Nitrite: NEGATIVE
PH: 7 (ref 5.0–8.0)
Protein, ur: NEGATIVE mg/dL
SPECIFIC GRAVITY, URINE: 1.02 (ref 1.005–1.030)

## 2016-03-23 LAB — I-STAT BETA HCG BLOOD, ED (MC, WL, AP ONLY): I-stat hCG, quantitative: <5 m[IU]/mL (ref <5)

## 2016-03-23 LAB — LIPASE, BLOOD: Lipase: 20 U/L (ref 11–51)

## 2016-03-23 MED ORDER — DIATRIZOATE MEGLUMINE & SODIUM 66-10 % PO SOLN
ORAL | Status: AC
  Filled 2016-03-23: qty 30

## 2016-03-23 MED ORDER — ONDANSETRON HCL 4 MG/2ML IJ SOLN
4.0000 mg | Freq: Once | INTRAMUSCULAR | Status: AC
  Administered 2016-03-23: 4 mg via INTRAVENOUS
  Filled 2016-03-23: qty 2

## 2016-03-23 MED ORDER — HYDROMORPHONE HCL 1 MG/ML IJ SOLN
1.0000 mg | Freq: Once | INTRAMUSCULAR | Status: AC
  Administered 2016-03-23: 1 mg via INTRAVENOUS
  Filled 2016-03-23: qty 1

## 2016-03-23 MED ORDER — IOPAMIDOL (ISOVUE-300) INJECTION 61%
100.0000 mL | Freq: Once | INTRAVENOUS | Status: AC | PRN
  Administered 2016-03-23: 100 mL via INTRAVENOUS

## 2016-03-23 MED ORDER — DIPHENHYDRAMINE HCL 25 MG PO CAPS
25.0000 mg | ORAL_CAPSULE | Freq: Once | ORAL | Status: AC
  Administered 2016-03-23: 25 mg via ORAL
  Filled 2016-03-23: qty 1

## 2016-03-23 MED ORDER — DIPHENHYDRAMINE HCL 50 MG/ML IJ SOLN
INTRAMUSCULAR | Status: AC
  Filled 2016-03-23: qty 1

## 2016-03-23 MED ORDER — DIPHENHYDRAMINE HCL 50 MG/ML IJ SOLN
25.0000 mg | Freq: Once | INTRAMUSCULAR | Status: AC
  Administered 2016-03-23: 25 mg via INTRAVENOUS

## 2016-03-23 NOTE — Discharge Instructions (Signed)
Abdominal (belly) pain can be caused by many things. any cases can be observed and treated at home after initial evaluation in the emergency department. Even though you are being discharged home, abdominal pain can be unpredictable. Therefore, you need a repeated exam if your pain does not resolve, returns, or worsens. Most patients with abdominal pain don't have to be admitted to the hospital or have surgery, but serious problems like appendicitis and gallbladder attacks can start out as nonspecific pain. Many abdominal conditions cannot be diagnosed in one visit, so follow-up evaluations are very important. °SEEK IMMEDIATE MEDICAL ATTENTION IF: °The pain does not go away or becomes severe, particularly over the next 8-12 hours.  °A temperature above 100.4F develops.  °Repeated vomiting occurs (multiple episodes).  °The pain becomes localized to portions of the abdomen.   In an adult, the left lower portion of the abdomen could be colitis or diverticulitis.  °Blood is being passed in stools or vomit (bright red or black tarry stools).  °Return also if you develop chest pain, difficulty breathing, dizziness or fainting, or become confused, poorly responsive, or inconsolable. ° °

## 2016-03-23 NOTE — ED Notes (Signed)
Pt c/o ruq abd pain since last Friday, states she has also had vomiting and diarrhea.  Pt states she was at work tonight and the pain became "unbearable"  Pt states she has history of bowel obstruction 3  Years ago.

## 2016-03-23 NOTE — ED Provider Notes (Signed)
CSN: [id number]     Arrival date & time 03/23/16  0219 History   First MD Initiated Contact with Patient 03/23/16 [phone]     Chief Complaint  Patient presents with  . Abdominal Pain    Patient is a 30 y.o. female presenting with abdominal pain. The history is provided by the patient.  Abdominal Pain Pain location:  RUQ and RLQ Pain quality: aching   Pain severity:  Severe Onset quality:  Gradual Timing:  Intermittent Progression:  Worsening Chronicity:  New Relieved by:  Nothing Worsened by:  Movement and palpation Associated symptoms: constipation, nausea and vomiting   Associated symptoms: no chest pain, no cough, no dysuria, no fever, no vaginal bleeding and no vaginal discharge   Risk factors: multiple surgeries   Patient reports for over 3 days she has had worsening RUQ/RLQ pain She reports nausea/vomiting and now having decreased bowel movements She reports h/o bowel obstruction while in [place] as previous appendectomy  Past Medical History  Diagnosis Date  . Diabetes mellitus without complication ([place])   . IBS (irritable bowel syndrome)   . Ovarian cyst    Past Surgical History  Procedure Laterality Date  . Small bowel repair    . Appendectomy    . Ovarian cyst drainage     No family history on file. Social History  Substance Use Topics  . Smoking status: Current Every Day Smoker  . Smokeless tobacco: None  . Alcohol Use: No   OB History    No data available     Review of Systems  Constitutional: Negative for fever.  Respiratory: Negative for cough.   Cardiovascular: Negative for chest pain.  Gastrointestinal: Positive for nausea, vomiting, abdominal pain and constipation.  Genitourinary: Negative for dysuria, vaginal bleeding and vaginal discharge.  All other systems reviewed and are negative.     Allergies  Review of patient's allergies indicates no known allergies.  Home Medications   Prior to Admission medications   Medication Sig Start  Date End Date Taking? Authorizing Provider  dicyclomine (BENTYL) 20 MG tablet Take 10 mg by mouth 1 day or 1 dose.   Yes Historical Provider, MD  lurasidone (LATUDA) 80 MG TABS tablet Take 80 mg by mouth 2 (two) times daily.   Yes Historical Provider, MD  metFORMIN (GLUCOPHAGE) 1000 MG tablet Take 1,000 mg by mouth 2 (two) times daily with a meal.   Yes Historical Provider, MD  ondansetron (ZOFRAN) 4 MG tablet Take 4 mg by mouth every 8 (eight) hours as needed for nausea or vomiting.   Yes Historical Provider, MD   BP 106/72 mmHg  Pulse 79  Temp(Src) 98.1 F (36.7 C) (Oral)  Resp 18  Ht 5\' 6"  (1.676 m)  Wt 95.255 kg  BMI 33.91 kg/m2  SpO2 100%  LMP 03/06/2016 Physical Exam  CONSTITUTIONAL: Well developed/well nourished, uncomfortable appearing HEAD: Normocephalic/atraumatic EYES: EOMI/PERRL ENMT: Mucous membranes moist NECK: supple no meningeal signs SPINE/BACK:entire spine nontender CV: S1/S2 noted, no murmurs/rubs/gallops noted LUNGS: Lungs are clear to auscultation bilaterally, no apparent distress ABDOMEN: soft, moderate tenderness in RUQ/RLQ, well healing scars noted, bowel sounds noted throughout abdomen [id number] bilateral CVAT NEURO: Pt is awake/alert/appropriate, moves all extremitiesx4.  No facial droop.   EXTREMITIES: pulses normal/equal, full ROM SKIN: warm, color normal PSYCH: anxious  ED Course  Procedures  4:04 AM Due to significant pain/vomiting, CT imaging ordered 6:49 AM CT scan did not show any acute findings Nausea is now under control abd soft without worsening tenderness I  doubt occult abdominal/gyn emergency She just had CT scan at [facility] earlier in may 2017 (pt did not inform me of this initially) I feel she is safe for d/c home She did have itching after IV contrast and now admits this has occurred before No angioedema/wheeze/urticaria noted Benadryl given Stable for d/c home  Labs Review Labs Reviewed  COMPREHENSIVE METABOLIC PANEL -  Abnormal; Notable for the following:    Glucose, Bld 112 (*)    Total Bilirubin 0.2 (*)    All other components within normal limits  CBC WITH DIFFERENTIAL/PLATELET - Abnormal; Notable for the following:    Hemoglobin 11.7 (*)    MCH 25.5 (*)    All other components within normal limits  URINALYSIS, ROUTINE W REFLEX MICROSCOPIC (NOT AT [facility]) - Abnormal; Notable for the following:    Hgb urine dipstick SMALL (*)    All other components within normal limits  URINE MICROSCOPIC-ADD ON - Abnormal; Notable for the following:    Squamous Epithelial / LPF 0-5 (*)    Bacteria, UA MANY (*)    Casts GRANULAR CAST (*)    All other components within normal limits  LIPASE, BLOOD  I-STAT BETA HCG BLOOD, ED ([facility], [facility], [facility] ONLY)    Imaging Review Ct Abdomen Pelvis W Contrast  03/23/2016  CLINICAL DATA:  Right upper and right lower quadrant pain for 6 days. EXAM: CT ABDOMEN AND PELVIS WITH CONTRAST TECHNIQUE: Multidetector CT imaging of the abdomen and pelvis was performed using the standard protocol following bolus administration of intravenous contrast. CONTRAST:  178mL ISOVUE-300 IOPAMIDOL (ISOVUE-300) INJECTION 61% COMPARISON:  CT 3 weeks prior 03/02/2016 FINDINGS: Lower chest:  The included lung bases are clear. Liver: No focal lesion. Hepatobiliary: Gallbladder physiologically distended, no calcified stone. No biliary dilatation. Pancreas: No ductal dilatation or inflammation. Spleen: Normal. Adrenal glands: No nodule. Kidneys: Symmetric renal enhancement. No hydronephrosis. No perinephric edema. Stomach/Bowel: Stomach physiologically distended. There are no dilated or thickened small bowel loops. Moderate volume of stool throughout the colon without colonic wall thickening. There is stool distending the rectum. The appendix not visualized, surgically absent per report. Vascular/Lymphatic: No retroperitoneal adenopathy. Abdominal aorta is normal in caliber. Reproductive: Uterus appears normal. There is a 4.6  cm cyst in the left ovary, not present on prior CT. Right ovary is grossly normal in size. Bladder: Decompressed and not well evaluated. Other: No free air, free fluid, or intra-abdominal fluid collection. Musculoskeletal: There are no acute or suspicious osseous abnormalities. IMPRESSION: 1. No acute abnormality or explanation for right-sided abdominal pain. 2. Left ovarian cyst measures 4.6 cm, likely a functional cyst as this was not present on CT 3 weeks prior. Electronically Signed   By: Jeb Levering M.D.   On: 03/23/2016 05:16   I have personally reviewed and evaluated these lab results as part of my medical decision-making.  Medications  diatrizoate meglumine-sodium (GASTROGRAFIN) 66-10 % solution (not administered)  HYDROmorphone (DILAUDID) injection 1 mg (1 mg Intravenous Given 03/23/16 0337)  ondansetron (ZOFRAN) injection 4 mg (4 mg Intravenous Given 03/23/16 0337)  HYDROmorphone (DILAUDID) injection 1 mg (1 mg Intravenous Given 03/23/16 0409)  ondansetron (ZOFRAN) injection 4 mg (4 mg Intravenous Given 03/23/16 0409)  iopamidol (ISOVUE-300) 61 % injection 100 mL (100 mLs Intravenous Contrast Given 03/23/16 0451)  diphenhydrAMINE (BENADRYL) injection 25 mg (25 mg Intravenous Given 03/23/16 0524)  ondansetron (ZOFRAN) injection 4 mg (4 mg Intravenous Given 03/23/16 0551)  diphenhydrAMINE (BENADRYL) capsule 25 mg (25 mg Oral Given 03/23/16 0636)  MDM   Final diagnoses:  Right upper quadrant pain    Nursing notes including past medical history and social history reviewed and considered in documentation Labs/vital reviewed myself and considered during evaluation     Ripley Fraise, MD 03/23/16 [phone]

## 2016-03-27 ENCOUNTER — Emergency Department (HOSPITAL_COMMUNITY): Payer: Self-pay

## 2016-03-27 ENCOUNTER — Encounter (HOSPITAL_COMMUNITY): Payer: Self-pay | Admitting: Emergency Medicine

## 2016-03-27 ENCOUNTER — Emergency Department (HOSPITAL_COMMUNITY)
Admission: EM | Admit: 2016-03-27 | Discharge: 2016-03-27 | Disposition: A | Discharge status: Home / Self Care | Payer: Self-pay | Attending: Emergency Medicine | Admitting: Emergency Medicine

## 2016-03-27 DIAGNOSIS — R112 Nausea with vomiting, unspecified: Secondary | ICD-10-CM | POA: Insufficient documentation

## 2016-03-27 DIAGNOSIS — E119 Type 2 diabetes mellitus without complications: Secondary | ICD-10-CM | POA: Insufficient documentation

## 2016-03-27 DIAGNOSIS — Z79899 Other long term (current) drug therapy: Secondary | ICD-10-CM | POA: Insufficient documentation

## 2016-03-27 DIAGNOSIS — Z7984 Long term (current) use of oral hypoglycemic drugs: Secondary | ICD-10-CM | POA: Insufficient documentation

## 2016-03-27 DIAGNOSIS — F17200 Nicotine dependence, unspecified, uncomplicated: Secondary | ICD-10-CM | POA: Insufficient documentation

## 2016-03-27 DIAGNOSIS — K5900 Constipation, unspecified: Secondary | ICD-10-CM | POA: Insufficient documentation

## 2016-03-27 LAB — CBC WITH DIFFERENTIAL/PLATELET
BASOS ABS: 0 10*3/uL (ref 0.0–0.1)
BASOS PCT: 0 %
EOS ABS: 0.1 10*3/uL (ref 0.0–0.7)
EOS PCT: 1 %
HCT: 36.1 % (ref 36.0–46.0)
Hemoglobin: 11.5 g/dL — ABNORMAL LOW (ref 12.0–15.0)
LYMPHS PCT: 22 %
Lymphs Abs: 1.2 10*3/uL (ref 0.7–4.0)
MCH: 25.7 pg — ABNORMAL LOW (ref 26.0–34.0)
MCHC: 31.9 g/dL (ref 30.0–36.0)
MCV: 80.8 fL (ref 78.0–100.0)
MONO ABS: 0.2 10*3/uL (ref 0.1–1.0)
Monocytes Relative: 4 %
Neutro Abs: 3.8 10*3/uL (ref 1.7–7.7)
Neutrophils Relative %: 73 %
PLATELETS: 270 10*3/uL (ref 150–400)
RBC: 4.47 MIL/uL (ref 3.87–5.11)
RDW: 15.3 % (ref 11.5–15.5)
WBC: 5.2 10*3/uL (ref 4.0–10.5)

## 2016-03-27 LAB — URINALYSIS, ROUTINE W REFLEX MICROSCOPIC
Bilirubin Urine: NEGATIVE
Glucose, UA: NEGATIVE mg/dL
Hgb urine dipstick: NEGATIVE
Ketones, ur: NEGATIVE mg/dL
Leukocytes, UA: NEGATIVE
NITRITE: NEGATIVE
PH: 5.5 (ref 5.0–8.0)
Protein, ur: NEGATIVE mg/dL

## 2016-03-27 LAB — COMPREHENSIVE METABOLIC PANEL
ALT: 14 U/L (ref 14–54)
AST: 14 U/L — AB (ref 15–41)
Albumin: 4 g/dL (ref 3.5–5.0)
Alkaline Phosphatase: 71 U/L (ref 38–126)
Anion gap: 6 (ref 5–15)
BUN: 9 mg/dL (ref 6–20)
CHLORIDE: 105 mmol/L (ref 101–111)
CO2: 25 mmol/L (ref 22–32)
Calcium: 9.1 mg/dL (ref 8.9–10.3)
Creatinine, Ser: 0.8 mg/dL (ref 0.44–1.00)
Glucose, Bld: 109 mg/dL — ABNORMAL HIGH (ref 65–99)
POTASSIUM: 3.5 mmol/L (ref 3.5–5.1)
SODIUM: 136 mmol/L (ref 135–145)
Total Bilirubin: 0.5 mg/dL (ref 0.3–1.2)
Total Protein: 8 g/dL (ref 6.5–8.1)

## 2016-03-27 LAB — POC URINE PREG, ED: PREG TEST UR: NEGATIVE

## 2016-03-27 MED ORDER — HYDROMORPHONE HCL 1 MG/ML IJ SOLN
1.0000 mg | Freq: Once | INTRAMUSCULAR | Status: AC
  Administered 2016-03-27: 1 mg via INTRAVENOUS
  Filled 2016-03-27: qty 1

## 2016-03-27 MED ORDER — KETOROLAC TROMETHAMINE 30 MG/ML IJ SOLN
30.0000 mg | Freq: Once | INTRAMUSCULAR | Status: DC
  Filled 2016-03-27: qty 1

## 2016-03-27 MED ORDER — SODIUM CHLORIDE 0.9 % IV BOLUS (SEPSIS)
1000.0000 mL | Freq: Once | INTRAVENOUS | Status: AC
  Administered 2016-03-27: 1000 mL via INTRAVENOUS

## 2016-03-27 MED ORDER — ONDANSETRON HCL 4 MG/2ML IJ SOLN
4.0000 mg | Freq: Once | INTRAMUSCULAR | Status: AC
  Administered 2016-03-27: 4 mg via INTRAVENOUS
  Filled 2016-03-27: qty 2

## 2016-03-27 MED ORDER — PROMETHAZINE HCL 25 MG/ML IJ SOLN
25.0000 mg | Freq: Once | INTRAMUSCULAR | Status: AC
  Administered 2016-03-27: 25 mg via INTRAVENOUS
  Filled 2016-03-27: qty 1

## 2016-03-27 NOTE — ED Notes (Signed)
Jodi Mcgee at bedside. 

## 2016-03-27 NOTE — Discharge Instructions (Signed)
Constipation, Adult °Constipation is when a person has fewer than three bowel movements a week, has difficulty having a bowel movement, or has stools that are dry, hard, or larger than normal. As people grow older, constipation is more common. A low-fiber diet, not taking in enough fluids, and taking certain medicines may make constipation worse.  °CAUSES  °· Certain medicines, such as antidepressants, pain medicine, iron supplements, antacids, and water pills.   °· Certain diseases, such as diabetes, irritable bowel syndrome (IBS), thyroid disease, or depression.   °· Not drinking enough water.   °· Not eating enough fiber-rich foods.   °· Stress or travel.   °· Lack of physical activity or exercise.   °· Ignoring the urge to have a bowel movement.   °· Using laxatives too much.   °SIGNS AND SYMPTOMS  °· Having fewer than three bowel movements a week.   °· Straining to have a bowel movement.   °· Having stools that are hard, dry, or larger than normal.   °· Feeling full or bloated.   °· Pain in the lower abdomen.   °· Not feeling relief after having a bowel movement.   °DIAGNOSIS  °Your health care provider will take a medical history and perform a physical exam. Further testing may be done for severe constipation. Some tests may include: °· A barium enema X-ray to examine your rectum, colon, and, sometimes, your small intestine.   °· A sigmoidoscopy to examine your lower colon.   °· A colonoscopy to examine your entire colon. °TREATMENT  °Treatment will depend on the severity of your constipation and what is causing it. Some dietary treatments include drinking more fluids and eating more fiber-rich foods. Lifestyle treatments may include regular exercise. If these diet and lifestyle recommendations do not help, your health care provider may recommend taking over-the-counter laxative medicines to help you have bowel movements. Prescription medicines may be prescribed if over-the-counter medicines do not work.    °HOME CARE INSTRUCTIONS  °· Eat foods that have a lot of fiber, such as fruits, vegetables, whole grains, and beans. °· Limit foods high in fat and processed sugars, such as french fries, hamburgers, cookies, candies, and soda.   °· A fiber supplement may be added to your diet if you cannot get enough fiber from foods.   °· Drink enough fluids to keep your urine clear or pale yellow.   °· Exercise regularly or as directed by your health care provider.   °· Go to the restroom when you have the urge to go. Do not hold it.   °· Only take over-the-counter or prescription medicines as directed by your health care provider. Do not take other medicines for constipation without talking to your health care provider first.   °SEEK IMMEDIATE MEDICAL CARE IF:  °· You have bright red blood in your stool.   °· Your constipation lasts for more than 4 days or gets worse.   °· You have abdominal or rectal pain.   °· You have thin, pencil-like stools.   °· You have unexplained weight loss. °MAKE SURE YOU:  °· Understand these instructions. °· Will watch your condition. °· Will get help right away if you are not doing well or get worse. °  °This information is not intended to replace advice given to you by your health care provider. Make sure you discuss any questions you have with your health care provider. °  °Document Released: 07/07/2004 Document Revised: 10/30/2014 Document Reviewed: 07/21/2013 °Elsevier Interactive Patient Education ©2016 [employer] ° °High-Fiber Diet °Fiber, also called dietary fiber, is a type of carbohydrate found in fruits, vegetables, whole grains, and   beans. A high-fiber diet can have many health benefits. Your health care provider may recommend a high-fiber diet to help: °· Prevent constipation. Fiber can make your bowel movements more regular. °· Lower your cholesterol. °· Relieve hemorrhoids, uncomplicated diverticulosis, or irritable bowel syndrome. °· Prevent overeating as part of a weight-loss  plan. °· Prevent heart disease, type 2 diabetes, and certain cancers. °WHAT IS MY PLAN? °The recommended daily intake of fiber includes: °· 38 grams for men under age 50. °· 30 grams for men over age 50. °· 25 grams for women under age 50. °· 21 grams for women over age 50. °You can get the recommended daily intake of dietary fiber by eating a variety of fruits, vegetables, grains, and beans. Your health care provider may also recommend a fiber supplement if it is not possible to get enough fiber through your diet. °WHAT DO I NEED TO KNOW ABOUT A HIGH-FIBER DIET? °· Fiber supplements have not been widely studied for their effectiveness, so it is better to get fiber through food sources. °· Always check the fiber content on the nutrition facts label of any prepackaged food. Look for foods that contain at least 5 grams of fiber per serving. °· Ask your dietitian if you have questions about specific foods that are related to your condition, especially if those foods are not listed in the following section. °· Increase your daily fiber consumption gradually. Increasing your intake of dietary fiber too quickly may cause bloating, cramping, or gas. °· Drink plenty of water. Water helps you to digest fiber. °WHAT FOODS CAN I EAT? °Grains °Whole-grain breads. Multigrain cereal. Oats and oatmeal. Brown rice. Barley. Bulgur wheat. Millet. Bran muffins. Popcorn. Rye wafer crackers. °Vegetables °Sweet potatoes. Spinach. Kale. Artichokes. Cabbage. Broccoli. Green peas. Carrots. Squash. °Fruits °Berries. Pears. Apples. Oranges. Avocados. Prunes and raisins. Dried figs. °Meats and Other Protein Sources °[place]y, kidney, pinto, and soy beans. Split peas. Lentils. Nuts and seeds. °Dairy °Fiber-fortified yogurt. °Beverages °Fiber-fortified soy milk. Fiber-fortified orange juice. °Other °Fiber bars. °The items listed above may not be a complete list of recommended foods or beverages. Contact your dietitian for more options. °WHAT FOODS  ARE NOT RECOMMENDED? °Grains °White bread. Pasta made with refined flour. White rice. °Vegetables °Fried potatoes. Canned vegetables. Well-cooked vegetables.  °Fruits °Fruit juice. Cooked, strained fruit. °Meats and Other Protein Sources °Fatty cuts of meat. Fried poultry or fried fish. °Dairy °Milk. Yogurt. Cream cheese. Sour cream. °Beverages °Soft drinks. °Other °Cakes and pastries. Butter and oils. °The items listed above may not be a complete list of foods and beverages to avoid. Contact your dietitian for more information. °WHAT ARE SOME TIPS FOR INCLUDING HIGH-FIBER FOODS IN MY DIET? °· Eat a wide variety of high-fiber foods. °· Make sure that half of all grains consumed each day are whole grains. °· Replace breads and cereals made from refined flour or white flour with whole-grain breads and cereals. °· Replace white rice with brown rice, bulgur wheat, or millet. °· Start the day with a breakfast that is high in fiber, such as a cereal that contains at least 5 grams of fiber per serving. °· Use beans in place of meat in soups, salads, or pasta. °· Eat high-fiber snacks, such as berries, raw vegetables, nuts, or popcorn. °  °This information is not intended to replace advice given to you by your health care provider. Make sure you discuss any questions you have with your health care provider. °  °Document Released: 10/09/2005 Document Revised: 10/30/2014 Document   Reviewed: 03/24/2014 °Elsevier Interactive Patient Education ©2016 Elsevier Inc. ° °

## 2016-03-27 NOTE — ED Provider Notes (Signed)
CSN: [id number]     Arrival date & time 03/27/16  0759 History   First MD Initiated Contact with Patient 03/27/16 [id number]     Chief Complaint  Patient presents with  . Abdominal Pain     (Consider location/radiation/quality/duration/timing/severity/associated sxs/prior Treatment) HPI   Jodi Mcgee is a 30 y.o. female who presents to the Emergency Department complaining of continued right upper abd lower abdominal pain associated with nausea and vomiting for one week.  She was seen here on 06/01 for same.  Pain improved slightly, but never resolved.  Continued vomiting and constipation.  She reports hx of bowel obstruction in 2014 and previous appendectomy.  She has recently moved to this area and doesn't have PCP, but has an appt with GI this week.  She denies fever, diarrhea, flatus, chest pain or shortness of breath, vaginal bleeding or discharge.    Past Medical History  Diagnosis Date  . Diabetes mellitus without complication ([place])   . IBS (irritable bowel syndrome)   . Ovarian cyst    Past Surgical History  Procedure Laterality Date  . Small bowel repair    . Appendectomy    . Ovarian cyst drainage     No family history on file. Social History  Substance Use Topics  . Smoking status: Current Every Day Smoker  . Smokeless tobacco: None  . Alcohol Use: No   OB History    No data available     Review of Systems  Constitutional: Negative for fever, chills and appetite change.  Respiratory: Negative for chest tightness and shortness of breath.   Cardiovascular: Negative for chest pain.  Gastrointestinal: Positive for nausea, vomiting, abdominal pain, constipation and abdominal distention. Negative for blood in stool.  Genitourinary: Negative for dysuria, flank pain, decreased urine volume and difficulty urinating.  Musculoskeletal: Negative for back pain.  Skin: Negative for color change and rash.  Neurological: Negative for dizziness, weakness and numbness.   Hematological: Negative for adenopathy.  All other systems reviewed and are negative.     Allergies  Contrast media  Home Medications   Prior to Admission medications   Medication Sig Start Date End Date Taking? Authorizing Provider  dicyclomine (BENTYL) 20 MG tablet Take 10 mg by mouth 1 day or 1 dose.    Historical Provider, MD  lurasidone (LATUDA) 80 MG TABS tablet Take 80 mg by mouth 2 (two) times daily.    Historical Provider, MD  metFORMIN (GLUCOPHAGE) 1000 MG tablet Take 1,000 mg by mouth 2 (two) times daily with a meal.    Historical Provider, MD  ondansetron (ZOFRAN) 4 MG tablet Take 4 mg by mouth every 8 (eight) hours as needed for nausea or vomiting.    Historical Provider, MD   BP 134/85 mmHg  Pulse 90  Temp(Src) 98.5 F (36.9 C) (Oral)  Resp 18  Ht 5\' 6"  (1.676 m)  Wt 95.255 kg  BMI 33.91 kg/m2  SpO2 100%  LMP 03/06/2016 Physical Exam  Constitutional: She is oriented to person, place, and time. She appears well-developed and well-nourished. No distress.  HENT:  Head: Normocephalic and atraumatic.  Mouth/Throat: Oropharynx is clear and moist.  Cardiovascular: Normal rate, regular rhythm, normal heart sounds and intact distal pulses.   No murmur heard. Pulmonary/Chest: Effort normal and breath sounds normal. No respiratory distress. She exhibits no tenderness.  Abdominal: Soft. Bowel sounds are normal. She exhibits no distension and no mass. There is tenderness. There is no rebound and no guarding.  Diffuse ttp of the  RUQ and RLQ.  No guarding or rebound tenderness.    Musculoskeletal: Normal range of motion. She exhibits no edema.  Neurological: She is alert and oriented to person, place, and time. She exhibits normal muscle tone. Coordination normal.  Skin: Skin is warm and dry.  Psychiatric: She has a normal mood and affect.  Nursing note and vitals reviewed.   ED Course  Procedures (including critical care time) Labs Review Labs Reviewed  CBC WITH  DIFFERENTIAL/PLATELET - Abnormal; Notable for the following:    Hemoglobin 11.5 (*)    MCH 25.7 (*)    All other components within normal limits  COMPREHENSIVE METABOLIC PANEL - Abnormal; Notable for the following:    Glucose, Bld 109 (*)    AST 14 (*)    All other components within normal limits  URINALYSIS, ROUTINE W REFLEX MICROSCOPIC (NOT AT [facility]) - Abnormal; Notable for the following:    Specific Gravity, Urine <1.005 (*)    All other components within normal limits  POC URINE PREG, ED    Imaging Review Dg Abd 1 View  03/27/2016  CLINICAL DATA:  Pain over the entire abdomen. Evaluate for bowel obstruction. EXAM: ABDOMEN - 1 VIEW COMPARISON:  CT abdomen 03/23/2016 FINDINGS: There is a moderate amount of stool in the ascending and transverse colon. There is no bowel dilatation to suggest obstruction. There is no evidence of pneumoperitoneum, portal venous gas or pneumatosis. There are no pathologic calcifications along the expected course of the ureters. The osseous structures are unremarkable. IMPRESSION: Moderate amount of stool in the ascending and transverse colon. No evidence of bowel obstruction. Electronically Signed   By: Kathreen Devoid   On: 03/27/2016 10:35    US Abdomen Limited  03/27/2016  CLINICAL DATA:  Right upper quadrant pain for 1 week. Subsequent encounter. EXAM: US ABDOMEN LIMITED - RIGHT UPPER QUADRANT COMPARISON:  CT abdomen and pelvis 03/23/2016. FINDINGS: Gallbladder: No gallstones or wall thickening visualized. No sonographic Murphy sign noted by sonographer. Common bile duct: Diameter: 0.3 cm Liver: No focal lesion identified. Within normal limits in parenchymal echogenicity. IMPRESSION: Negative exam. Electronically Signed   By: Inge Rise M.D.   On: 03/27/2016 09:26    I have personally reviewed and evaluated these images and lab results as part of my medical decision-making.   EKG Interpretation None      MDM   Final diagnoses:  Constipation,  unspecified constipation type    Pt seen here 4 days ago for same.  CT abd/pelvis at that time was negative for acute findings.    [place] of abd today neg for cholecystitis, or gallstones.  Plain film of abd is neg for SBO  Pt requesting additional pain medications, Toradol ordered, pt refused stating to nursing that stuff doesn't work.    Pt is well appearing, resting comfortably.  Vitals stable and work-up is reassuring.  I do not feel that additional narcotics are indicated, pain felt to be related to constipation.  Pt agrees to increase water intake, OTC mag citrate.  And has GI appt this week.  Return precautions given.    Kem Parkinson, PA-C 03/29/16 2034  Blanchie Dessert, MD 03/30/16 [phone]

## 2016-03-27 NOTE — ED Notes (Signed)
Pt c/o continued abd pain/n/v and constipation.

## 2016-03-27 NOTE — ED Notes (Signed)
Pt talking on phone upon entering room. NAD noted. Pt quietly resting.

## 2016-04-21 ENCOUNTER — Encounter (HOSPITAL_COMMUNITY): Payer: Self-pay

## 2016-04-21 ENCOUNTER — Emergency Department (HOSPITAL_COMMUNITY)
Admission: EM | Admit: 2016-04-21 | Discharge: 2016-04-21 | Disposition: A | Discharge status: Home / Self Care | Payer: Medicaid Other | Attending: Emergency Medicine | Admitting: Emergency Medicine

## 2016-04-21 ENCOUNTER — Emergency Department (HOSPITAL_COMMUNITY): Payer: Medicaid Other

## 2016-04-21 DIAGNOSIS — R112 Nausea with vomiting, unspecified: Secondary | ICD-10-CM | POA: Insufficient documentation

## 2016-04-21 DIAGNOSIS — F17210 Nicotine dependence, cigarettes, uncomplicated: Secondary | ICD-10-CM | POA: Insufficient documentation

## 2016-04-21 DIAGNOSIS — E119 Type 2 diabetes mellitus without complications: Secondary | ICD-10-CM | POA: Insufficient documentation

## 2016-04-21 DIAGNOSIS — Z79899 Other long term (current) drug therapy: Secondary | ICD-10-CM | POA: Insufficient documentation

## 2016-04-21 DIAGNOSIS — R109 Unspecified abdominal pain: Secondary | ICD-10-CM

## 2016-04-21 DIAGNOSIS — R1030 Lower abdominal pain, unspecified: Secondary | ICD-10-CM | POA: Insufficient documentation

## 2016-04-21 DIAGNOSIS — Z8543 Personal history of malignant neoplasm of ovary: Secondary | ICD-10-CM | POA: Insufficient documentation

## 2016-04-21 DIAGNOSIS — R197 Diarrhea, unspecified: Secondary | ICD-10-CM | POA: Insufficient documentation

## 2016-04-21 LAB — COMPREHENSIVE METABOLIC PANEL
ALBUMIN: 3.7 g/dL (ref 3.5–5.0)
ALT: 12 U/L — ABNORMAL LOW (ref 14–54)
ANION GAP: 4 — AB (ref 5–15)
AST: 12 U/L — ABNORMAL LOW (ref 15–41)
Alkaline Phosphatase: 64 U/L (ref 38–126)
BILIRUBIN TOTAL: 0.4 mg/dL (ref 0.3–1.2)
BUN: 11 mg/dL (ref 6–20)
CO2: 26 mmol/L (ref 22–32)
Calcium: 8.6 mg/dL — ABNORMAL LOW (ref 8.9–10.3)
Chloride: 105 mmol/L (ref 101–111)
Creatinine, Ser: 0.85 mg/dL (ref 0.44–1.00)
GFR calc Af Amer: >60 mL/min (ref >60)
Glucose, Bld: 104 mg/dL — ABNORMAL HIGH (ref 65–99)
POTASSIUM: 3.5 mmol/L (ref 3.5–5.1)
Sodium: 135 mmol/L (ref 135–145)
TOTAL PROTEIN: 7.4 g/dL (ref 6.5–8.1)

## 2016-04-21 LAB — URINALYSIS, ROUTINE W REFLEX MICROSCOPIC
BILIRUBIN URINE: NEGATIVE
GLUCOSE, UA: NEGATIVE mg/dL
Hgb urine dipstick: NEGATIVE
KETONES UR: NEGATIVE mg/dL
Leukocytes, UA: NEGATIVE
NITRITE: NEGATIVE
PH: 6 (ref 5.0–8.0)
Protein, ur: NEGATIVE mg/dL

## 2016-04-21 LAB — CBC WITH DIFFERENTIAL/PLATELET
BASOS PCT: 0 %
Basophils Absolute: 0 10*3/uL (ref 0.0–0.1)
Eosinophils Absolute: 0.2 10*3/uL (ref 0.0–0.7)
Eosinophils Relative: 4 %
HEMATOCRIT: 35.7 % — AB (ref 36.0–46.0)
HEMOGLOBIN: 11.3 g/dL — AB (ref 12.0–15.0)
LYMPHS ABS: 1.2 10*3/uL (ref 0.7–4.0)
LYMPHS PCT: 27 %
MCH: 26.1 pg (ref 26.0–34.0)
MCHC: 31.7 g/dL (ref 30.0–36.0)
MCV: 82.4 fL (ref 78.0–100.0)
MONO ABS: 0.3 10*3/uL (ref 0.1–1.0)
MONOS PCT: 6 %
NEUTROS ABS: 2.9 10*3/uL (ref 1.7–7.7)
NEUTROS PCT: 63 %
Platelets: 261 10*3/uL (ref 150–400)
RBC: 4.33 MIL/uL (ref 3.87–5.11)
RDW: 15.3 % (ref 11.5–15.5)
WBC: 4.6 10*3/uL (ref 4.0–10.5)

## 2016-04-21 LAB — HCG, QUANTITATIVE, PREGNANCY

## 2016-04-21 LAB — LIPASE, BLOOD: Lipase: 24 U/L (ref 11–51)

## 2016-04-21 MED ORDER — FENTANYL CITRATE (PF) 100 MCG/2ML IJ SOLN
50.0000 ug | Freq: Once | INTRAMUSCULAR | Status: AC
  Administered 2016-04-21: 50 ug via INTRAVENOUS
  Filled 2016-04-21: qty 2

## 2016-04-21 MED ORDER — FENTANYL CITRATE (PF) 100 MCG/2ML IJ SOLN
100.0000 ug | Freq: Once | INTRAMUSCULAR | Status: AC
  Administered 2016-04-21: 100 ug via INTRAVENOUS

## 2016-04-21 MED ORDER — SODIUM CHLORIDE 0.9 % IV SOLN
1000.0000 mL | INTRAVENOUS | Status: DC
  Administered 2016-04-21: 1000 mL via INTRAVENOUS

## 2016-04-21 MED ORDER — FENTANYL CITRATE (PF) 100 MCG/2ML IJ SOLN
INTRAMUSCULAR | Status: AC
  Filled 2016-04-21: qty 2

## 2016-04-21 MED ORDER — SODIUM CHLORIDE 0.9 % IV SOLN
1000.0000 mL | Freq: Once | INTRAVENOUS | Status: AC
  Administered 2016-04-21: 1000 mL via INTRAVENOUS

## 2016-04-21 MED ORDER — ONDANSETRON HCL 4 MG/2ML IJ SOLN
4.0000 mg | Freq: Once | INTRAMUSCULAR | Status: AC
  Administered 2016-04-21: 4 mg via INTRAVENOUS
  Filled 2016-04-21: qty 2

## 2016-04-21 NOTE — ED Notes (Signed)
MD at bedside. 

## 2016-04-21 NOTE — ED Notes (Signed)
Lower abd pain x 3 days, has been having green diarrhea with some vomiting.

## 2016-04-21 NOTE — ED Notes (Signed)
Pt given second bottle of contrast to drink at this time.

## 2016-04-21 NOTE — ED Notes (Signed)
Pt unable to provide urine specimen at this time

## 2016-04-21 NOTE — ED Notes (Signed)
Pt unable to provide urine specimen.  

## 2016-04-21 NOTE — ED Provider Notes (Signed)
CSN: [id number]     Arrival date & time 04/21/16  0331 History   First MD Initiated Contact with Patient 04/21/16 0350 am   Chief Complaint  Patient presents with  . Abdominal Pain  . Diarrhea     (Consider location/radiation/quality/duration/timing/severity/associated sxs/prior Treatment) HPI patient reports 3 days ago she started having lower abdominal pain with yellow diarrhea that now has progressed to green diarrhea. Patient states she works third shift and she is able to sleep during the day. However when she gets up to go to work she is having diarrhea about 5 times a day and tonight has had nausea with vomiting twice. She states all she ate today were crackers and juice. She states she hurts a lot in her right upper quadrant and then across her lower abdomen. She denies any fever or chills. She does state she feels little dizzy and lightheaded. She denies being around anybody else is sick or eating anything she thinks could've made her ill.  Patient reports she was diagnosed with diverticulitis 2 years ago and had a abscess that was percutaneously drained while she was living in [place]. She does not recall if this pain is similar. She also has a history of IBS and mainly has constipation that she treats with Bentyl  PCP [place] (on insurance card, never seen there)  Past Medical History  Diagnosis Date  . Ovarian cyst   . Peritonitis, acute generalized ([place])   . Sepsis ([place])   . Anxiety   . Anemia   . GERD (gastroesophageal reflux disease)     has resolved  . IBS (irritable bowel syndrome)   . Diabetes mellitus without complication [facility])    Past Surgical History  Procedure Laterality Date  . Cesarean section    . Ovarian cyst drainage    . Appendectomy      ruptured    No family history on file. Social History  Substance Use Topics  . Smoking status: Current Every Day Smoker -- 0.50 packs/day    Types: Cigarettes  . Smokeless tobacco: Never Used   . Alcohol Use: Yes     Comment: occassional  employed Smokes 5-7 cigs a day  OB History    Gravida Para Term Preterm AB TAB SAB Ectopic Multiple Living   2 2 2       2      Review of Systems  All other systems reviewed and are negative.     Allergies  Contrast media  Home Medications   None per patient  Prior to Admission medications   Medication Sig Start Date End Date Taking? Authorizing Provider  acetaminophen (TYLENOL) 325 MG tablet Take 650 mg by mouth every 6 (six) hours as needed.   Yes Historical Provider, MD  dicyclomine (BENTYL) 20 MG tablet Take 1 tablet (20 mg total) by mouth 2 (two) times daily as needed for spasms. 11/04/15   Julianne Rice, MD  diphenoxylate-atropine (LOMOTIL) 2.5-0.025 MG tablet Take 1 tablet by mouth 4 (four) times daily as needed for diarrhea or loose stools. Patient not taking: Reported on 11/04/2015 10/27/15   Tanna Furry, MD  diphenoxylate-atropine (LOMOTIL) 2.5-0.025 MG tablet Take 1 tablet by mouth 4 (four) times daily as needed for diarrhea or loose stools. Patient not taking: Reported on 11/04/2015 10/27/15   Tanna Furry, MD  docusate sodium (COLACE) 100 MG capsule Take 1 capsule (100 mg total) by mouth every 12 (twelve) hours. Patient not taking: Reported on 10/27/2015 09/08/15   Harrell Gave  Hewitt Shorts, MD  Lurasidone HCl (LATUDA PO) Take by mouth.    Historical Provider, MD  methocarbamol (ROBAXIN) 500 MG tablet Take 1 tablet (500 mg total) by mouth 2 (two) times daily. Patient not taking: Reported on 10/27/2015 08/03/15   Shanon Rosser, MD  metoCLOPramide (REGLAN) 10 MG tablet Take 1 tablet (10 mg total) by mouth every 6 (six) hours as needed for nausea or vomiting (nausea/headache). 01/16/16   Orlie Dakin, MD  metroNIDAZOLE (FLAGYL) 500 MG tablet Take 1 tablet (500 mg total) by mouth 2 (two) times daily. One po bid x 7 days 01/16/16   Orlie Dakin, MD  naproxen (NAPROSYN) 500 MG tablet Take 500 mg by mouth 2 (two) times daily with a meal.     Historical Provider, MD  ondansetron (ZOFRAN ODT) 4 MG disintegrating tablet Take 1 tablet (4 mg total) by mouth every 8 (eight) hours as needed for nausea. 11/04/15   Julianne Rice, MD  oxyCODONE-acetaminophen (PERCOCET/ROXICET) 5-325 MG tablet Take 1 tablet by mouth every 4 (four) hours as needed for severe pain. Patient not taking: Reported on 09/07/2015 08/03/15   Shanon Rosser, MD  polyethylene glycol (MIRALAX / GLYCOLAX) packet Take 17 g by mouth daily as needed for moderate constipation. Patient not taking: Reported on 10/27/2015 09/08/15   Orpah Greek, MD   BP 131/85 mmHg  Pulse 93  Temp(Src) 98.3 F (36.8 C) (Oral)  Resp 20  Ht 5\' 6"  (1.676 m)  Wt 210 lb (95.255 kg)  BMI 33.91 kg/m2  SpO2 100%  LMP 04/06/2016  Vital signs normal   Physical Exam  Constitutional: She is oriented to person, place, and time. She appears well-developed and well-nourished.  Non-toxic appearance. She does not appear ill. No distress.  HENT:  Head: Normocephalic and atraumatic.  Right Ear: External ear normal.  Left Ear: External ear normal.  Nose: Nose normal. No mucosal edema or rhinorrhea.  Mouth/Throat: Mucous membranes are normal. No dental abscesses or uvula swelling.  Tongue dry  Eyes: Conjunctivae and EOM are normal. Pupils are equal, round, and reactive to light.  Neck: Normal range of motion and full passive range of motion without pain. Neck supple.  Cardiovascular: Normal rate, regular rhythm and normal heart sounds.  Exam reveals no gallop and no friction rub.   No murmur heard. Pulmonary/Chest: Effort normal and breath sounds normal. No respiratory distress. She has no wheezes. She has no rhonchi. She has no rales. She exhibits no tenderness and no crepitus.  Abdominal: Soft. Normal appearance and bowel sounds are normal. She exhibits no distension. There is tenderness. There is no rebound and no guarding.  Mild tenderness right abdomen  Musculoskeletal: Normal range of  motion. She exhibits no edema or tenderness.  Moves all extremities well.   Neurological: She is alert and oriented to person, place, and time. She has normal strength. No cranial nerve deficit.  Skin: Skin is warm, dry and intact. No rash noted. No erythema. No pallor.  Psychiatric: She has a normal mood and affect. Her speech is normal and behavior is normal. Her mood appears not anxious.  Nursing note and vitals reviewed.   ED Course  Procedures (including critical care time)  Medications  0.9 %  sodium chloride infusion (0 mLs Intravenous Stopped 04/21/16 0623)    Followed by  0.9 %  sodium chloride infusion (0 mLs Intravenous Stopped 04/21/16 0623)    Followed by  0.9 %  sodium chloride infusion (1,000 mLs Intravenous New Bag/Given 04/21/16 [id number])  fentaNYL (SUBLIMAZE) injection 50 mcg (50 mcg Intravenous Given 04/21/16 0404)  ondansetron (ZOFRAN) injection 4 mg (4 mg Intravenous Given 04/21/16 0404)  ondansetron (ZOFRAN) injection 4 mg (4 mg Intravenous Given 04/21/16 0452)  fentaNYL (SUBLIMAZE) injection 50 mcg (50 mcg Intravenous Given 04/21/16 0452)   Patient was started on IV fluids and given IV nausea and pain medicine.  Patient is requesting dilaudid from the nurse stating "it works really well". I reviewed patient's prior CT scans. She had a CT scan in November 2016 just showing a large stool burden, she had a CT scan December 2015 without acute changes, and in September 2015 she had a cyst on ovary. At this point I'm going to see patient's CT results and see if she has any acute findings.  Patient noted be resting quietly.  7 AM patient has just finished her second dose of oral contrast. She will be left with Dr. Dolly Rias to get the results of her CT scan.   Labs Review Results for orders placed or performed during the hospital encounter of 04/21/16  Comprehensive metabolic panel  Result Value Ref Range   Sodium 135 135 - 145 mmol/L   Potassium 3.5 3.5 - 5.1 mmol/L   Chloride  105 101 - 111 mmol/L   CO2 26 22 - 32 mmol/L   Glucose, Bld 104 (H) 65 - 99 mg/dL   BUN 11 6 - 20 mg/dL   Creatinine, Ser 0.85 0.44 - 1.00 mg/dL   Calcium 8.6 (L) 8.9 - 10.3 mg/dL   Total Protein 7.4 6.5 - 8.1 g/dL   Albumin 3.7 3.5 - 5.0 g/dL   AST 12 (L) 15 - 41 U/L   ALT 12 (L) 14 - 54 U/L   Alkaline Phosphatase 64 38 - 126 U/L   Total Bilirubin 0.4 0.3 - 1.2 mg/dL   GFR calc non Af Amer >60 >60 mL/min   GFR calc Af Amer >60 >60 mL/min   Anion gap 4 (L) 5 - 15  CBC with Differential  Result Value Ref Range   WBC 4.6 4.0 - 10.5 K/uL   RBC 4.33 3.87 - 5.11 MIL/uL   Hemoglobin 11.3 (L) 12.0 - 15.0 g/dL   HCT 35.7 (L) 36.0 - 46.0 %   MCV 82.4 78.0 - 100.0 fL   MCH 26.1 26.0 - 34.0 pg   MCHC 31.7 30.0 - 36.0 g/dL   RDW 15.3 11.5 - 15.5 %   Platelets 261 150 - 400 K/uL   Neutrophils Relative % 63 %   Neutro Abs 2.9 1.7 - 7.7 K/uL   Lymphocytes Relative 27 %   Lymphs Abs 1.2 0.7 - 4.0 K/uL   Monocytes Relative 6 %   Monocytes Absolute 0.3 0.1 - 1.0 K/uL   Eosinophils Relative 4 %   Eosinophils Absolute 0.2 0.0 - 0.7 K/uL   Basophils Relative 0 %   Basophils Absolute 0.0 0.0 - 0.1 K/uL  Lipase, blood  Result Value Ref Range   Lipase 24 11 - 51 U/L  hCG, quantitative, pregnancy  Result Value Ref Range   hCG, Beta Chain, Quant, S <1 <5 mIU/mL   Laboratory interpretation all normal except Mild anemia     Imaging Review No results found.   AP CT pending   I have personally reviewed and evaluated these images and lab results as part of my medical decision-making.    MDM   Final diagnoses:  Abdominal pain, unspecified abdominal location  Nausea vomiting and diarrhea  Disposition pending   Rolland Porter, MD, Barbette Or, MD 04/21/16 [phone]

## 2016-06-07 ENCOUNTER — Emergency Department (HOSPITAL_BASED_OUTPATIENT_CLINIC_OR_DEPARTMENT_OTHER)
Admission: EM | Admit: 2016-06-07 | Discharge: 2016-06-08 | Disposition: A | Discharge status: Home / Self Care | Payer: Medicaid Other | Attending: Emergency Medicine | Admitting: Emergency Medicine

## 2016-06-07 ENCOUNTER — Encounter (HOSPITAL_BASED_OUTPATIENT_CLINIC_OR_DEPARTMENT_OTHER): Payer: Self-pay

## 2016-06-07 DIAGNOSIS — A599 Trichomoniasis, unspecified: Secondary | ICD-10-CM

## 2016-06-07 DIAGNOSIS — E119 Type 2 diabetes mellitus without complications: Secondary | ICD-10-CM | POA: Insufficient documentation

## 2016-06-07 DIAGNOSIS — F17210 Nicotine dependence, cigarettes, uncomplicated: Secondary | ICD-10-CM | POA: Insufficient documentation

## 2016-06-07 DIAGNOSIS — D649 Anemia, unspecified: Secondary | ICD-10-CM | POA: Insufficient documentation

## 2016-06-07 DIAGNOSIS — N390 Urinary tract infection, site not specified: Secondary | ICD-10-CM | POA: Insufficient documentation

## 2016-06-07 LAB — COMPREHENSIVE METABOLIC PANEL
ALBUMIN: 3.8 g/dL (ref 3.5–5.0)
ALT: 14 U/L (ref 14–54)
ANION GAP: 6 (ref 5–15)
AST: 14 U/L — AB (ref 15–41)
Alkaline Phosphatase: 61 U/L (ref 38–126)
BILIRUBIN TOTAL: 0.4 mg/dL (ref 0.3–1.2)
BUN: 12 mg/dL (ref 6–20)
CHLORIDE: 106 mmol/L (ref 101–111)
CO2: 26 mmol/L (ref 22–32)
Calcium: 9 mg/dL (ref 8.9–10.3)
Creatinine, Ser: 0.92 mg/dL (ref 0.44–1.00)
GFR calc Af Amer: >60 mL/min (ref >60)
Glucose, Bld: 97 mg/dL (ref 65–99)
POTASSIUM: 3.9 mmol/L (ref 3.5–5.1)
Sodium: 138 mmol/L (ref 135–145)
TOTAL PROTEIN: 7.5 g/dL (ref 6.5–8.1)

## 2016-06-07 LAB — URINE MICROSCOPIC-ADD ON: RBC / HPF: NONE SEEN RBC/hpf (ref 0–5)

## 2016-06-07 LAB — CBC
HEMATOCRIT: 36.1 % (ref 36.0–46.0)
HEMOGLOBIN: 11.4 g/dL — AB (ref 12.0–15.0)
MCH: 25.8 pg — ABNORMAL LOW (ref 26.0–34.0)
MCHC: 31.6 g/dL (ref 30.0–36.0)
MCV: 81.7 fL (ref 78.0–100.0)
Platelets: 294 10*3/uL (ref 150–400)
RBC: 4.42 MIL/uL (ref 3.87–5.11)
RDW: 14.9 % (ref 11.5–15.5)
WBC: 5 10*3/uL (ref 4.0–10.5)

## 2016-06-07 LAB — URINALYSIS, ROUTINE W REFLEX MICROSCOPIC
BILIRUBIN URINE: NEGATIVE
Glucose, UA: NEGATIVE mg/dL
HGB URINE DIPSTICK: NEGATIVE
Ketones, ur: NEGATIVE mg/dL
Nitrite: NEGATIVE
PH: 8 (ref 5.0–8.0)
Protein, ur: NEGATIVE mg/dL
SPECIFIC GRAVITY, URINE: 1.015 (ref 1.005–1.030)

## 2016-06-07 LAB — LIPASE, BLOOD: LIPASE: 28 U/L (ref 11–51)

## 2016-06-07 LAB — PREGNANCY, URINE: Preg Test, Ur: NEGATIVE

## 2016-06-07 MED ORDER — NITROFURANTOIN MONOHYD MACRO 100 MG PO CAPS
100.0000 mg | ORAL_CAPSULE | Freq: Once | ORAL | Status: AC
  Administered 2016-06-07: 100 mg via ORAL
  Filled 2016-06-07: qty 1

## 2016-06-07 MED ORDER — ACETAMINOPHEN 325 MG PO TABS
650.0000 mg | ORAL_TABLET | Freq: Once | ORAL | Status: AC
  Administered 2016-06-07: 650 mg via ORAL
  Filled 2016-06-07: qty 2

## 2016-06-07 MED ORDER — METRONIDAZOLE 500 MG PO TABS
2000.0000 mg | ORAL_TABLET | Freq: Once | ORAL | Status: AC
  Administered 2016-06-07: 2000 mg via ORAL
  Filled 2016-06-07: qty 4

## 2016-06-07 NOTE — ED Provider Notes (Signed)
[facility] DEPT [facility] Provider Note   CSN: [id number] Arrival date & time: 06/07/16  2118  By signing my name below, I, Irene Pap, attest that this documentation has been prepared under the direction and in the presence of Delora Fuel, MD. Electronically Signed: Irene Pap, ED Scribe. 06/07/16. 11:14 PM.  History   Chief Complaint Chief Complaint  Patient presents with  . Flank Pain   The history is provided by the patient. No language interpreter was used.  HPI Comments: Jodi Mcgee is a 30 y.o. Female with a hx of GERD, DM, peritonitis, appendectomy, ovarian cyst, C-section, and IBS who presents to the Emergency Department complaining of gradually worsening RLQ abdominal pain that radiates to the right flank onset 4 days ago. She currently rates her pain 9/10. Pt reports worsening pain with bending at the waist, palpation, and urination. She reports associated nausea, frequency, and chills. Pt is concerned that the pain is coming from her colon. She has not been taking anything for her symptoms. She denies fever, diarrhea, or vomiting.   Past Medical History:  Diagnosis Date  . Anemia   . Anxiety   . Diabetes mellitus without complication ([place])   . GERD (gastroesophageal reflux disease)    has resolved  . IBS (irritable bowel syndrome)   . Ovarian cyst   . Peritonitis, acute generalized ([place])   . Sepsis ([place])     There are no active problems to display for this patient.   Past Surgical History:  Procedure Laterality Date  . APPENDECTOMY     ruptured   . CESAREAN SECTION    . OVARIAN CYST DRAINAGE      OB History    Gravida Para Term Preterm AB Living   2 2 2     2    SAB TAB Ectopic Multiple Live Births           2       Home Medications    Prior to Admission medications   Medication Sig Start Date End Date Taking? Authorizing Provider  acetaminophen (TYLENOL) 325 MG tablet Take 650 mg by mouth every 6 (six) hours as needed.    Historical  Provider, MD  dicyclomine (BENTYL) 20 MG tablet Take 1 tablet (20 mg total) by mouth 2 (two) times daily as needed for spasms. 11/04/15   Julianne Rice, MD  Lurasidone HCl (LATUDA PO) Take by mouth.    Historical Provider, MD  metoCLOPramide (REGLAN) 10 MG tablet Take 1 tablet (10 mg total) by mouth every 6 (six) hours as needed for nausea or vomiting (nausea/headache). 01/16/16   Orlie Dakin, MD  ondansetron (ZOFRAN ODT) 4 MG disintegrating tablet Take 1 tablet (4 mg total) by mouth every 8 (eight) hours as needed for nausea. 11/04/15   Julianne Rice, MD  oxyCODONE-acetaminophen (PERCOCET/ROXICET) 5-325 MG tablet Take 1 tablet by mouth every 4 (four) hours as needed for severe pain. Patient not taking: Reported on 09/07/2015 08/03/15   Shanon Rosser, MD  polyethylene glycol (MIRALAX / GLYCOLAX) packet Take 17 g by mouth daily as needed for moderate constipation. Patient not taking: Reported on 10/27/2015 09/08/15   Orpah Greek, MD    Family History No family history on file.  Social History Social History  Substance Use Topics  . Smoking status: Current Every Day Smoker    Packs/day: 0.50    Types: Cigarettes  . Smokeless tobacco: Never Used  . Alcohol use Yes     Comment: occassional  Allergies   Contrast media [iodinated diagnostic agents]   Review of Systems Review of Systems  Constitutional: Positive for chills. Negative for fever.  Gastrointestinal: Positive for abdominal pain and nausea. Negative for diarrhea and vomiting.  Genitourinary: Positive for dysuria and flank pain.  All other systems reviewed and are negative.    Physical Exam Updated Vital Signs BP 118/70 (BP Location: Right Arm)   Pulse 92   Temp 98.7 F (37.1 C) (Oral)   Resp 18   Ht 5\' 7"  (1.702 m)   Wt 210 lb (95.3 kg)   LMP 05/06/2016   SpO2 100%   BMI 32.89 kg/m   Physical Exam  Constitutional: She is oriented to person, place, and time. She appears well-developed and  well-nourished.  HENT:  Head: Normocephalic and atraumatic.  Eyes: EOM are normal. Pupils are equal, round, and reactive to light.  Neck: Normal range of motion. Neck supple. No JVD present.  Cardiovascular: Normal rate, regular rhythm and normal heart sounds.   No murmur heard. Pulmonary/Chest: Effort normal and breath sounds normal. She has no wheezes. She has no rales. She exhibits no tenderness.  Abdominal: Soft. Bowel sounds are normal. She exhibits no distension and no mass. There is tenderness in the right lower quadrant.  Mild to moderate tenderness to RLQ, right mid abdomen, and right costovertebral angle  Musculoskeletal: Normal range of motion. She exhibits no edema.  Lymphadenopathy:    She has no cervical adenopathy.  Neurological: She is alert and oriented to person, place, and time. No cranial nerve deficit. She exhibits normal muscle tone. Coordination normal.  Skin: Skin is warm and dry. No rash noted.  Psychiatric: She has a normal mood and affect. Her behavior is normal. Judgment and thought content normal.  Nursing note and vitals reviewed.    ED Treatments / Results  DIAGNOSTIC STUDIES: Oxygen Saturation is 100% on RA, normal by my interpretation.    COORDINATION OF CARE: 11:10 PM-Discussed treatment plan which includes labs with pt at bedside and pt agreed to plan.   Labs (all labs ordered are listed, but only abnormal results are displayed) Labs Reviewed  URINALYSIS, ROUTINE W REFLEX MICROSCOPIC (NOT AT [facility]) - Abnormal; Notable for the following:       Result Value   APPearance CLOUDY (*)    Leukocytes, UA SMALL (*)    All other components within normal limits  URINE MICROSCOPIC-ADD ON - Abnormal; Notable for the following:    Squamous Epithelial / LPF 6-30 (*)    Bacteria, UA MANY (*)    All other components within normal limits  COMPREHENSIVE METABOLIC PANEL - Abnormal; Notable for the following:    AST 14 (*)    All other components within normal  limits  CBC - Abnormal; Notable for the following:    Hemoglobin 11.4 (*)    MCH 25.8 (*)    All other components within normal limits  PREGNANCY, URINE  LIPASE, BLOOD    Procedures Procedures (including critical care time)  Medications Ordered in ED Medications  acetaminophen (TYLENOL) tablet 650 mg (650 mg Oral Given 06/07/16 2339)  metroNIDAZOLE (FLAGYL) tablet 2,000 mg (2,000 mg Oral Given 06/07/16 2340)  nitrofurantoin (macrocrystal-monohydrate) (MACROBID) capsule 100 mg (100 mg Oral Given 06/07/16 2339)     Initial Impression / Assessment and Plan / ED Course  I have reviewed the triage vital signs and the nursing notes.  Pertinent labs & imaging results that were available during my care of the patient were reviewed  by me and considered in my medical decision making (see chart for details).  Clinical Course    Flank and lower abdominal pain with urine showing evidence of infection. Also, trichomonas and is noted, presumably from vaginal contamination. Laboratory workup shows mild anemia which is unchanged from baseline. She is given a dose of metronidazole in the ED and advised to have all sexual partners treated. She is sent home with prescription for nitrofurantoin and follow-up with PCP in one week to make sure her urine is clearing. Old records are reviewed and she does have multiple ED visits for abdominal complaints, but that today's symptoms are different from prior visits.  Final Clinical Impressions(s) / ED Diagnoses   Final diagnoses:  Urinary tract infection without hematuria, site unspecified  Normochromic normocytic anemia  Trichomonal infection  I personally performed the services described in this documentation, which was scribed in my presence. The recorded information has been reviewed and is accurate.     New Prescriptions New Prescriptions   No medications on file     Delora Fuel, MD [id number]

## 2016-06-07 NOTE — ED Notes (Signed)
MD at bedside. 

## 2016-06-07 NOTE — ED Triage Notes (Signed)
C/o right flank pain x 3 days-NAD-steady gait 

## 2016-06-08 MED ORDER — TRAMADOL HCL 50 MG PO TABS: 50.0000 mg | ORAL_TABLET | Freq: Four times a day (QID) | ORAL | 0 refills | Status: DC | PRN

## 2016-06-08 MED ORDER — NITROFURANTOIN MONOHYD MACRO 100 MG PO CAPS: 100.0000 mg | ORAL_CAPSULE | Freq: Two times a day (BID) | ORAL | 0 refills | Status: DC

## 2016-07-04 ENCOUNTER — Encounter (HOSPITAL_COMMUNITY): Payer: Self-pay | Admitting: Emergency Medicine

## 2016-07-04 ENCOUNTER — Emergency Department (HOSPITAL_COMMUNITY)
Admission: EM | Admit: 2016-07-04 | Discharge: 2016-07-04 | Disposition: A | Discharge status: Home / Self Care | Payer: Self-pay | Attending: Emergency Medicine | Admitting: Emergency Medicine

## 2016-07-04 DIAGNOSIS — R102 Pelvic and perineal pain: Secondary | ICD-10-CM | POA: Insufficient documentation

## 2016-07-04 DIAGNOSIS — E119 Type 2 diabetes mellitus without complications: Secondary | ICD-10-CM | POA: Insufficient documentation

## 2016-07-04 DIAGNOSIS — Z7984 Long term (current) use of oral hypoglycemic drugs: Secondary | ICD-10-CM | POA: Insufficient documentation

## 2016-07-04 DIAGNOSIS — Z79899 Other long term (current) drug therapy: Secondary | ICD-10-CM | POA: Insufficient documentation

## 2016-07-04 DIAGNOSIS — F17210 Nicotine dependence, cigarettes, uncomplicated: Secondary | ICD-10-CM | POA: Insufficient documentation

## 2016-07-04 LAB — BASIC METABOLIC PANEL
Anion gap: 8 (ref 5–15)
BUN: 8 mg/dL (ref 6–20)
CALCIUM: 9.1 mg/dL (ref 8.9–10.3)
CO2: 27 mmol/L (ref 22–32)
CREATININE: 0.93 mg/dL (ref 0.44–1.00)
Chloride: 102 mmol/L (ref 101–111)
GFR calc Af Amer: >60 mL/min (ref >60)
Glucose, Bld: 105 mg/dL — ABNORMAL HIGH (ref 65–99)
POTASSIUM: 3.8 mmol/L (ref 3.5–5.1)
SODIUM: 137 mmol/L (ref 135–145)

## 2016-07-04 LAB — URINE MICROSCOPIC-ADD ON: WBC UA: NONE SEEN WBC/hpf (ref 0–5)

## 2016-07-04 LAB — CBC WITH DIFFERENTIAL/PLATELET
BASOS ABS: 0 10*3/uL (ref 0.0–0.1)
BASOS PCT: 0 %
EOS ABS: 0.1 10*3/uL (ref 0.0–0.7)
Eosinophils Relative: 1 %
HCT: 35.4 % — ABNORMAL LOW (ref 36.0–46.0)
Hemoglobin: 11.4 g/dL — ABNORMAL LOW (ref 12.0–15.0)
LYMPHS PCT: 21 %
Lymphs Abs: 1.5 10*3/uL (ref 0.7–4.0)
MCH: 26 pg (ref 26.0–34.0)
MCHC: 32.2 g/dL (ref 30.0–36.0)
MCV: 80.8 fL (ref 78.0–100.0)
MONO ABS: 0.5 10*3/uL (ref 0.1–1.0)
Monocytes Relative: 7 %
Neutro Abs: 4.8 10*3/uL (ref 1.7–7.7)
Neutrophils Relative %: 71 %
Platelets: 277 10*3/uL (ref 150–400)
RBC: 4.38 MIL/uL (ref 3.87–5.11)
RDW: 14.4 % (ref 11.5–15.5)
WBC: 6.9 10*3/uL (ref 4.0–10.5)

## 2016-07-04 LAB — URINALYSIS, ROUTINE W REFLEX MICROSCOPIC
GLUCOSE, UA: NEGATIVE mg/dL
Hgb urine dipstick: NEGATIVE
LEUKOCYTES UA: NEGATIVE
NITRITE: NEGATIVE
PROTEIN: 30 mg/dL — AB
Specific Gravity, Urine: >1.03 — ABNORMAL HIGH (ref 1.005–1.030)
pH: 5.5 (ref 5.0–8.0)

## 2016-07-04 LAB — WET PREP, GENITAL
SPERM: NONE SEEN
TRICH WET PREP: NONE SEEN
Yeast Wet Prep HPF POC: NONE SEEN

## 2016-07-04 LAB — PREGNANCY, URINE: Preg Test, Ur: NEGATIVE

## 2016-07-04 MED ORDER — HYDROCODONE-ACETAMINOPHEN 5-325 MG PO TABS
1.0000 | ORAL_TABLET | Freq: Once | ORAL | Status: AC
  Administered 2016-07-04: 1 via ORAL
  Filled 2016-07-04: qty 1

## 2016-07-04 MED ORDER — ACETAMINOPHEN 325 MG PO TABS
650.0000 mg | ORAL_TABLET | Freq: Once | ORAL | Status: DC
  Filled 2016-07-04: qty 2

## 2016-07-04 NOTE — ED Notes (Signed)
Pt reports having a UTI a few weeks ago and completing medications. Pt reports an aching pain flared up all around her abdomen and lower back.

## 2016-07-04 NOTE — ED Provider Notes (Signed)
[facility] DEPT Provider Note   CSN: [id number] Arrival date & time: 07/04/16  0705     History   Chief Complaint Chief Complaint  Patient presents with  . Pelvic Pain    HPI Jodi Mcgee is a 30 y.o. female.  She presents for evaluation of low back pain, pelvic discomfort, and a "pressure" sensation when urinating. She denies hematuria, dysuria, vaginal discharge, vaginal bleeding, pregnancy, fever, chills, cough or chest pain. She was treated for a UTI with Macrodantin, 2 weeks ago.  HPI  Past Medical History:  Diagnosis Date  . Diabetes mellitus without complication ([place])   . IBS (irritable bowel syndrome)   . Ovarian cyst     There are no active problems to display for this patient.   Past Surgical History:  Procedure Laterality Date  . APPENDECTOMY    . OVARIAN CYST DRAINAGE    . SMALL BOWEL REPAIR      OB History    No data available       Home Medications    Prior to Admission medications   Medication Sig Start Date End Date Taking? Authorizing Provider  lurasidone (LATUDA) 80 MG TABS tablet Take 80 mg by mouth 2 (two) times daily.   Yes Historical Provider, MD  metFORMIN (GLUCOPHAGE) 1000 MG tablet Take 1,000 mg by mouth 2 (two) times daily with a meal.   Yes Historical Provider, MD  ondansetron (ZOFRAN) 4 MG tablet Take 4 mg by mouth every 8 (eight) hours as needed for nausea or vomiting.   Yes Historical Provider, MD    Family History No family history on file.  Social History Social History  Substance Use Topics  . Smoking status: Current Every Day Smoker    Packs/day: 0.50    Types: Cigarettes  . Smokeless tobacco: Never Used  . Alcohol use Yes     Comment: occasional     Allergies   Nsaids; Toradol [ketorolac tromethamine]; and Contrast media [iodinated diagnostic agents]   Review of Systems Review of Systems  All other systems reviewed and are negative.    Physical Exam Updated Vital Signs BP 131/82   Pulse 79    Temp 98.6 F (37 C)   Resp 18   Ht 5\' 7"  (1.702 m)   Wt 210 lb (95.3 kg)   LMP 06/11/2016   SpO2 97%   BMI 32.89 kg/m   Physical Exam  Constitutional: She is oriented to person, place, and time. She appears well-developed. She appears distressed (She is uncomfortable).  obese  HENT:  Head: Normocephalic and atraumatic.  Eyes: Conjunctivae and EOM are normal. Pupils are equal, round, and reactive to light.  Neck: Normal range of motion and phonation normal. Neck supple.  Cardiovascular: Normal rate and regular rhythm.   Pulmonary/Chest: Effort normal and breath sounds normal. She exhibits no tenderness.  Abdominal: Soft. She exhibits no distension. There is no tenderness. There is no guarding.  Genitourinary:  Genitourinary Comments: Normal external female genitalia. Small amount of slightly opaque vaginal discharge. No cervical friability. On bimanual examination there is no cervical motion tenderness. There is mild midline tenderness over the uterus, which is of normal size. There is mild bilateral adnexal tenderness, without palpable mass or swelling. Right ovary is palpable, normal;  left ovary is not palpable.  Musculoskeletal: Normal range of motion.  Diffuse upper and lower back tenderness to light palpation. No alteration of motion.  Neurological: She is alert and oriented to person, place, and time. She exhibits normal muscle  tone.  Skin: Skin is warm and dry.  Psychiatric: She has a normal mood and affect. Her behavior is normal. Judgment and thought content normal.  Nursing note and vitals reviewed.    ED Treatments / Results  Labs (all labs ordered are listed, but only abnormal results are displayed) Labs Reviewed  WET PREP, GENITAL - Abnormal; Notable for the following:       Result Value   Clue Cells Wet Prep HPF POC PRESENT (*)    WBC, Wet Prep HPF POC FEW (*)    All other components within normal limits  URINALYSIS, ROUTINE W REFLEX MICROSCOPIC (NOT AT [facility]) -  Abnormal; Notable for the following:    Specific Gravity, Urine >1.030 (*)    Bilirubin Urine SMALL (*)    Ketones, ur TRACE (*)    Protein, ur 30 (*)    All other components within normal limits  BASIC METABOLIC PANEL - Abnormal; Notable for the following:    Glucose, Bld 105 (*)    All other components within normal limits  CBC WITH DIFFERENTIAL/PLATELET - Abnormal; Notable for the following:    Hemoglobin 11.4 (*)    HCT 35.4 (*)    All other components within normal limits  URINE MICROSCOPIC-ADD ON - Abnormal; Notable for the following:    Squamous Epithelial / LPF 0-5 (*)    Bacteria, UA FEW (*)    All other components within normal limits  PREGNANCY, URINE  RPR  HIV ANTIBODY (ROUTINE TESTING)  GC/CHLAMYDIA PROBE AMP ([place]) NOT AT [facility]    EKG  EKG Interpretation None       Radiology No results found.  Procedures Procedures (including critical care time)  Medications Ordered in ED Medications  HYDROcodone-acetaminophen (NORCO/VICODIN) 5-325 MG per tablet 1 tablet (1 tablet Oral Given 07/04/16 0819)     Initial Impression / Assessment and Plan / ED Course  I have reviewed the triage vital signs and the nursing notes.  Pertinent labs & imaging results that were available during my care of the patient were reviewed by me and considered in my medical decision making (see chart for details).  Clinical Course    Medications  HYDROcodone-acetaminophen (NORCO/VICODIN) 5-325 MG per tablet 1 tablet (1 tablet Oral Given 07/04/16 0819)    Patient Vitals for the past 24 hrs:  BP Temp Pulse Resp SpO2 Height Weight  07/04/16 1130 131/82 - 79 18 97 % - -  07/04/16 1100 123/92 - 75 16 94 % - -  07/04/16 1030 119/86 - 89 16 99 % - -  07/04/16 0920 110/70 - 88 18 100 % - -  07/04/16 0714 123/82 98.6 F (37 C) 111 18 99 % 5\' 7"  (1.702 m) 210 lb (95.3 kg)    3:38 PM Reevaluation with update and discussion. After initial assessment and treatment, an updated  evaluation reveals She is Norco this time and has no further complaints. Findings discussed with patient and all questions answered. Jodi Mcgee    Final Clinical Impressions(s) / ED Diagnoses   Final diagnoses:  Pelvic pain in female   Nonspecific pelvic pain, with mild vaginal discharge. Doubt PID, acute intra-abdominal process, series bacterial infection or metabolic instability. No palpable mass on pelvic examination. Ultrasound Pelvis Not Indicated at This Time.  Nursing Notes Reviewed/ Care Coordinated Applicable Imaging Reviewed Interpretation of Laboratory Data incorporated into ED treatment  The patient appears reasonably screened and/or stabilized for discharge and I doubt any other medical condition or other [facility] requiring  further screening, evaluation, or treatment in the ED at this time prior to discharge.  Plan: Home Medications- OTC analgesia; Home Treatments- rest; return here if the recommended treatment, does not improve the symptoms; Recommended follow up- PCP prn   New Prescriptions Discharge Medication List as of 07/04/2016 12:22 PM       Daleen Bo, MD 07/04/16 1540

## 2016-07-04 NOTE — ED Triage Notes (Signed)
Pt c/o pelvic pain x 4-5 days. denies vaginal bleeding or d/c. Some nausea. Denies v/d/constipation. nad noted.

## 2016-07-04 NOTE — Discharge Instructions (Signed)
Use Tylenol or ibuprofen for pain.  Follow-up with a primary care doctor or gynecologist for further problems.

## 2016-07-05 LAB — RPR: RPR Ser Ql: NONREACTIVE

## 2016-07-05 LAB — GC/CHLAMYDIA PROBE AMP (~~LOC~~) NOT AT ARMC
CHLAMYDIA, DNA PROBE: NEGATIVE
Neisseria Gonorrhea: NEGATIVE

## 2016-07-05 LAB — HIV ANTIBODY (ROUTINE TESTING W REFLEX): HIV Screen 4th Generation wRfx: NONREACTIVE

## 2016-12-15 ENCOUNTER — Emergency Department (HOSPITAL_COMMUNITY): Payer: Self-pay

## 2016-12-15 ENCOUNTER — Emergency Department (HOSPITAL_COMMUNITY)
Admission: EM | Admit: 2016-12-15 | Discharge: 2016-12-15 | Disposition: A | Discharge status: Home / Self Care | Payer: Self-pay | Attending: Emergency Medicine | Admitting: Emergency Medicine

## 2016-12-15 ENCOUNTER — Encounter (HOSPITAL_COMMUNITY): Payer: Self-pay | Admitting: *Deleted

## 2016-12-15 DIAGNOSIS — R197 Diarrhea, unspecified: Secondary | ICD-10-CM

## 2016-12-15 DIAGNOSIS — F17210 Nicotine dependence, cigarettes, uncomplicated: Secondary | ICD-10-CM | POA: Insufficient documentation

## 2016-12-15 DIAGNOSIS — E876 Hypokalemia: Secondary | ICD-10-CM | POA: Insufficient documentation

## 2016-12-15 DIAGNOSIS — R112 Nausea with vomiting, unspecified: Secondary | ICD-10-CM

## 2016-12-15 DIAGNOSIS — Z79899 Other long term (current) drug therapy: Secondary | ICD-10-CM | POA: Insufficient documentation

## 2016-12-15 DIAGNOSIS — K529 Noninfective gastroenteritis and colitis, unspecified: Secondary | ICD-10-CM | POA: Insufficient documentation

## 2016-12-15 DIAGNOSIS — E119 Type 2 diabetes mellitus without complications: Secondary | ICD-10-CM | POA: Insufficient documentation

## 2016-12-15 LAB — COMPREHENSIVE METABOLIC PANEL
ALBUMIN: 3.9 g/dL (ref 3.5–5.0)
ALT: 16 U/L (ref 14–54)
AST: 17 U/L (ref 15–41)
Alkaline Phosphatase: 74 U/L (ref 38–126)
Anion gap: 10 (ref 5–15)
BILIRUBIN TOTAL: 0.4 mg/dL (ref 0.3–1.2)
BUN: 17 mg/dL (ref 6–20)
CO2: 25 mmol/L (ref 22–32)
CREATININE: 1.06 mg/dL — AB (ref 0.44–1.00)
Calcium: 8.8 mg/dL — ABNORMAL LOW (ref 8.9–10.3)
Chloride: 103 mmol/L (ref 101–111)
GFR calc Af Amer: >60 mL/min (ref >60)
GFR calc non Af Amer: >60 mL/min (ref >60)
GLUCOSE: 146 mg/dL — AB (ref 65–99)
POTASSIUM: 3.1 mmol/L — AB (ref 3.5–5.1)
Sodium: 138 mmol/L (ref 135–145)
TOTAL PROTEIN: 8.2 g/dL — AB (ref 6.5–8.1)

## 2016-12-15 LAB — CBC
HEMATOCRIT: 41 % (ref 36.0–46.0)
Hemoglobin: 13.4 g/dL (ref 12.0–15.0)
MCH: 26.4 pg (ref 26.0–34.0)
MCHC: 32.7 g/dL (ref 30.0–36.0)
MCV: 80.7 fL (ref 78.0–100.0)
PLATELETS: 378 10*3/uL (ref 150–400)
RBC: 5.08 MIL/uL (ref 3.87–5.11)
RDW: 14.9 % (ref 11.5–15.5)
WBC: 6.8 10*3/uL (ref 4.0–10.5)

## 2016-12-15 LAB — URINALYSIS, ROUTINE W REFLEX MICROSCOPIC
Bilirubin Urine: NEGATIVE
Glucose, UA: NEGATIVE mg/dL
Ketones, ur: NEGATIVE mg/dL
Nitrite: NEGATIVE
PH: 5 (ref 5.0–8.0)
Protein, ur: NEGATIVE mg/dL
SPECIFIC GRAVITY, URINE: 1.031 — AB (ref 1.005–1.030)

## 2016-12-15 LAB — POC URINE PREG, ED: PREG TEST UR: NEGATIVE

## 2016-12-15 LAB — LIPASE, BLOOD: Lipase: 14 U/L (ref 11–51)

## 2016-12-15 MED ORDER — CIPROFLOXACIN HCL 500 MG PO TABS: 500.0000 mg | ORAL_TABLET | Freq: Two times a day (BID) | ORAL | 0 refills | Status: DC

## 2016-12-15 MED ORDER — POTASSIUM CHLORIDE ER 20 MEQ PO TBCR: 20.0000 meq | EXTENDED_RELEASE_TABLET | Freq: Two times a day (BID) | ORAL | 0 refills | Status: DC

## 2016-12-15 MED ORDER — POTASSIUM CHLORIDE CRYS ER 20 MEQ PO TBCR
40.0000 meq | EXTENDED_RELEASE_TABLET | Freq: Once | ORAL | Status: AC
  Administered 2016-12-15: 40 meq via ORAL
  Filled 2016-12-15: qty 2

## 2016-12-15 MED ORDER — DICYCLOMINE HCL 20 MG PO TABS: 20.0000 mg | ORAL_TABLET | Freq: Three times a day (TID) | ORAL | 0 refills | Status: DC

## 2016-12-15 MED ORDER — ONDANSETRON HCL 4 MG PO TABS: 4.0000 mg | ORAL_TABLET | Freq: Three times a day (TID) | ORAL | 0 refills | Status: DC | PRN

## 2016-12-15 MED ORDER — METRONIDAZOLE 500 MG PO TABS: 500.0000 mg | ORAL_TABLET | Freq: Four times a day (QID) | ORAL | 0 refills | Status: DC

## 2016-12-15 MED ORDER — CIPROFLOXACIN HCL 250 MG PO TABS
500.0000 mg | ORAL_TABLET | Freq: Once | ORAL | Status: AC
  Administered 2016-12-15: 500 mg via ORAL
  Filled 2016-12-15: qty 2

## 2016-12-15 MED ORDER — ONDANSETRON HCL 4 MG/2ML IJ SOLN
4.0000 mg | Freq: Once | INTRAMUSCULAR | Status: AC
  Administered 2016-12-15: 4 mg via INTRAVENOUS
  Filled 2016-12-15: qty 2

## 2016-12-15 MED ORDER — METRONIDAZOLE 500 MG PO TABS
500.0000 mg | ORAL_TABLET | Freq: Once | ORAL | Status: AC
  Administered 2016-12-15: 500 mg via ORAL
  Filled 2016-12-15: qty 1

## 2016-12-15 MED ORDER — FENTANYL CITRATE (PF) 100 MCG/2ML IJ SOLN
50.0000 ug | Freq: Once | INTRAMUSCULAR | Status: AC
  Administered 2016-12-15: 50 ug via INTRAVENOUS
  Filled 2016-12-15: qty 2

## 2016-12-15 MED ORDER — DICYCLOMINE HCL 10 MG/ML IM SOLN
20.0000 mg | Freq: Once | INTRAMUSCULAR | Status: AC
  Administered 2016-12-15: 20 mg via INTRAMUSCULAR
  Filled 2016-12-15: qty 2

## 2016-12-15 MED ORDER — KETOROLAC TROMETHAMINE 30 MG/ML IJ SOLN
30.0000 mg | Freq: Once | INTRAMUSCULAR | Status: AC
  Administered 2016-12-15: 30 mg via INTRAVENOUS
  Filled 2016-12-15: qty 1

## 2016-12-15 NOTE — ED Triage Notes (Signed)
Pt c/o left abdominal pain with n/v/d that started yesterday

## 2016-12-15 NOTE — ED Notes (Signed)
Pt states her pain is still at an 8/10; Dr. Tomi Bamberger informed and new orders given for pain meds

## 2016-12-15 NOTE — ED Provider Notes (Signed)
[facility] DEPT Provider Note   CSN: [id number] Arrival date & time: 12/15/16  0311  Time seen 05:25 AM  History   Chief Complaint Chief Complaint  Patient presents with  . Abdominal Pain    HPI Ben RONEE RISHER is a 31 y.o. female.  HPI  patient states she started having some intermittent pain in her left upper quadrant on February 21. She states it would last about an hour. However she woke up about midnight and the pain is been constant since. She has had nausea vomiting about 4 times and diarrhea about 3 times. She denies any blood. She states the diarrhea is loose and watery. She denies fever. She describes the pain as cramping. She states nothing makes the pain hurt more, a cold shower made it feel better. She states she's never had this pain before. She did not eat anything different today. She denies being around anybody sick.  PCP [facility]   Past Medical History:  Diagnosis Date  . Anemia   . Anxiety   . Diabetes mellitus without complication ([place])   . GERD (gastroesophageal reflux disease)    has resolved  . IBS (irritable bowel syndrome)   . Ovarian cyst   . Peritonitis, acute generalized ([place])   . Sepsis ([place])     There are no active problems to display for this patient.   Past Surgical History:  Procedure Laterality Date  . APPENDECTOMY     ruptured   . CESAREAN SECTION    . OVARIAN CYST DRAINAGE      OB History    Gravida Para Term Preterm AB Living   2 2 2     2    SAB TAB Ectopic Multiple Live Births           2       Home Medications    Prior to Admission medications   Medication Sig Start Date End Date Taking? Authorizing Provider  acetaminophen (TYLENOL) 325 MG tablet Take 650 mg by mouth every 6 (six) hours as needed.    Historical Provider, MD  ciprofloxacin (CIPRO) 500 MG tablet Take 1 tablet (500 mg total) by mouth 2 (two) times daily. 12/15/16   Rolland Porter, MD  dicyclomine (BENTYL) 20 MG tablet  Take 1 tablet (20 mg total) by mouth 4 (four) times daily -  before meals and at bedtime. 12/15/16   Rolland Porter, MD  Lurasidone HCl (LATUDA PO) Take by mouth.    Historical Provider, MD  metoCLOPramide (REGLAN) 10 MG tablet Take 1 tablet (10 mg total) by mouth every 6 (six) hours as needed for nausea or vomiting (nausea/headache). 01/16/16   Orlie Dakin, MD  metroNIDAZOLE (FLAGYL) 500 MG tablet Take 1 tablet (500 mg total) by mouth 4 (four) times daily. 12/15/16   Rolland Porter, MD  nitrofurantoin, macrocrystal-monohydrate, (MACROBID) 100 MG capsule Take 1 capsule (100 mg total) by mouth 2 (two) times daily. X 7 days [id number]   Delora Fuel, MD  ondansetron (ZOFRAN ODT) 4 MG disintegrating tablet Take 1 tablet (4 mg total) by mouth every 8 (eight) hours as needed for nausea. 11/04/15   Julianne Rice, MD  ondansetron (ZOFRAN) 4 MG tablet Take 1 tablet (4 mg total) by mouth every 8 (eight) hours as needed. 12/15/16   Rolland Porter, MD  traMADol (ULTRAM) 50 MG tablet Take 1 tablet (50 mg total) by mouth every 6 (six) hours as needed. [id number]   Delora Fuel, MD    Family  History History reviewed. No pertinent family history.  Social History Social History  Substance Use Topics  . Smoking status: Current Every Day Smoker    Packs/day: 0.50    Types: Cigarettes  . Smokeless tobacco: Never Used  . Alcohol use Yes     Comment: occassional  employed Smokes 3 cigs a day   Allergies   Contrast media [iodinated diagnostic agents]   Review of Systems Review of Systems  All other systems reviewed and are negative.    Physical Exam Updated Vital Signs BP 122/83 (BP Location: Right Arm)   Pulse 102   Temp 97.7 F (36.5 C) (Oral)   Resp 20   Ht 5\' 6"  (1.676 m)   Wt 210 lb (95.3 kg)   LMP 12/07/2016   SpO2 100%   BMI 33.89 kg/m   Vital signs normal except tachycardia   Physical Exam  Constitutional: She is oriented to person, place, and time. She appears well-developed and well-nourished.   Non-toxic appearance. She does not appear ill. She appears distressed.  Crying outloud  HENT:  Head: Normocephalic and atraumatic.  Right Ear: External ear normal.  Left Ear: External ear normal.  Nose: Nose normal. No mucosal edema or rhinorrhea.  Mouth/Throat: Oropharynx is clear and moist and mucous membranes are normal. No dental abscesses or uvula swelling.  Eyes: Conjunctivae and EOM are normal. Pupils are equal, round, and reactive to light.  Neck: Normal range of motion and full passive range of motion without pain. Neck supple.  Cardiovascular: Normal rate, regular rhythm and normal heart sounds.  Exam reveals no gallop and no friction rub.   No murmur heard. Pulmonary/Chest: Effort normal and breath sounds normal. No respiratory distress. She has no wheezes. She has no rhonchi. She has no rales. She exhibits no tenderness and no crepitus.  Abdominal: Soft. Normal appearance and bowel sounds are normal. She exhibits no distension. There is no tenderness. There is no rebound and no guarding.    Tender in the LUQ, Lt CVAT  Musculoskeletal: Normal range of motion. She exhibits no edema or tenderness.  Moves all extremities well.   Neurological: She is alert and oriented to person, place, and time. She has normal strength. No cranial nerve deficit.  Skin: Skin is warm, dry and intact. No rash noted. No erythema. No pallor.  Psychiatric: She has a normal mood and affect. Her speech is normal and behavior is normal. Her mood appears not anxious.  Nursing note and vitals reviewed.    ED Treatments / Results  Labs (all labs ordered are listed, but only abnormal results are displayed) Results for orders placed or performed during the hospital encounter of 12/15/16  Lipase, blood  Result Value Ref Range   Lipase 14 11 - 51 U/L  Comprehensive metabolic panel  Result Value Ref Range   Sodium 138 135 - 145 mmol/L   Potassium 3.1 (L) 3.5 - 5.1 mmol/L   Chloride 103 101 - 111 mmol/L    CO2 25 22 - 32 mmol/L   Glucose, Bld 146 (H) 65 - 99 mg/dL   BUN 17 6 - 20 mg/dL   Creatinine, Ser 1.06 (H) 0.44 - 1.00 mg/dL   Calcium 8.8 (L) 8.9 - 10.3 mg/dL   Total Protein 8.2 (H) 6.5 - 8.1 g/dL   Albumin 3.9 3.5 - 5.0 g/dL   AST 17 15 - 41 U/L   ALT 16 14 - 54 U/L   Alkaline Phosphatase 74 38 - 126 U/L  Total Bilirubin 0.4 0.3 - 1.2 mg/dL   GFR calc non Af Amer >60 >60 mL/min   GFR calc Af Amer >60 >60 mL/min   Anion gap 10 5 - 15  CBC  Result Value Ref Range   WBC 6.8 4.0 - 10.5 K/uL   RBC 5.08 3.87 - 5.11 MIL/uL   Hemoglobin 13.4 12.0 - 15.0 g/dL   HCT 41.0 36.0 - 46.0 %   MCV 80.7 78.0 - 100.0 fL   MCH 26.4 26.0 - 34.0 pg   MCHC 32.7 30.0 - 36.0 g/dL   RDW 14.9 11.5 - 15.5 %   Platelets 378 150 - 400 K/uL  Urinalysis, Routine w reflex microscopic  Result Value Ref Range   Color, Urine YELLOW YELLOW   APPearance CLOUDY (A) CLEAR   Specific Gravity, Urine 1.031 (H) 1.005 - 1.030   pH 5.0 5.0 - 8.0   Glucose, UA NEGATIVE NEGATIVE mg/dL   Hgb urine dipstick SMALL (A) NEGATIVE   Bilirubin Urine NEGATIVE NEGATIVE   Ketones, ur NEGATIVE NEGATIVE mg/dL   Protein, ur NEGATIVE NEGATIVE mg/dL   Nitrite NEGATIVE NEGATIVE   Leukocytes, UA SMALL (A) NEGATIVE   RBC / HPF TOO NUMEROUS TO COUNT 0 - 5 RBC/hpf   WBC, UA 6-30 0 - 5 WBC/hpf   Bacteria, UA RARE (A) NONE SEEN   Mucous PRESENT    Budding Yeast PRESENT   POC Urine Pregnancy, ED (do NOT order at [facility])  Result Value Ref Range   Preg Test, Ur NEGATIVE NEGATIVE   Laboratory interpretation all normal     EKG  EKG Interpretation None       Radiology Ct Renal Stone Study  Result Date: 12/15/2016 CLINICAL DATA:  31 year old female with left-sided flank pain. EXAM: CT ABDOMEN AND PELVIS WITHOUT CONTRAST TECHNIQUE: Multidetector CT imaging of the abdomen and pelvis was performed following the standard protocol without IV contrast. COMPARISON:  CT dated 04/21/2016 FINDINGS: Evaluation of this exam is limited in the  absence of intravenous contrast. Lower chest: The visualized lung bases are clear. No intra-abdominal free air. Small free fluid within the pelvis. Hepatobiliary: No focal liver abnormality is seen. No gallstones, gallbladder wall thickening, or biliary dilatation. Pancreas: Unremarkable. No pancreatic ductal dilatation or surrounding inflammatory changes. Spleen: Normal in size without focal abnormality. Adrenals/Urinary Tract: Adrenal glands are unremarkable. Kidneys are normal, without renal calculi, focal lesion, or hydronephrosis. Bladder is unremarkable. Stomach/Bowel: Multiple normal caliber fluid-filled loops of small bowel noted in the mid to lower abdomen. There is inflammatory changes of the distal small bowel in the right lower quadrant with stranding of the mesentery. The terminal ileum appears unremarkable. Findings most consistent enteritis, likely of an infectious in etiology. Loose stool noted in the proximal colon. Formed stool seen in the distal colon. There is no evidence of bowel obstruction. No CT evidence acute appendicitis. Vascular/Lymphatic: The abdominal aorta and IVC are grossly unremarkable on this noncontrast study. No portal venous gas identified. There is no adenopathy. Reproductive: The uterus is anteverted and grossly unremarkable. There is a 2.5 cm dominant right ovarian follicle/ cyst. The ovaries are otherwise unremarkable. Other: There is a midline vertical anterior abdominal wall incisional scar. No fluid collection. Musculoskeletal: No acute or significant osseous findings. IMPRESSION: 1. Findings most consistent with enteritis, likely of an infectious etiology. Correlation with clinical exam recommended. No evidence of bowel obstruction. 2. No hydronephrosis or nephrolithiasis. 3. Small free fluid within the pelvis. Electronically Signed   By: Laren Everts.D.  On: 12/15/2016 06:48    Procedures Procedures (including critical care time)  Medications Ordered in  ED Medications  fentaNYL (SUBLIMAZE) injection 50 mcg (50 mcg Intravenous Given 12/15/16 0540)  ondansetron (ZOFRAN) injection 4 mg (4 mg Intravenous Given 12/15/16 0542)  ketorolac (TORADOL) 30 MG/ML injection 30 mg (30 mg Intravenous Given 12/15/16 0621)  potassium chloride SA (K-DUR,KLOR-CON) CR tablet 40 mEq (40 mEq Oral Given 12/15/16 0731)  ciprofloxacin (CIPRO) tablet 500 mg (500 mg Oral Given 12/15/16 0731)  metroNIDAZOLE (FLAGYL) tablet 500 mg (500 mg Oral Given 12/15/16 0731)  dicyclomine (BENTYL) injection 20 mg (20 mg Intramuscular Given 12/15/16 0731)     Initial Impression / Assessment and Plan / ED Course  I have reviewed the triage vital signs and the nursing notes.  Pertinent labs & imaging results that were available during my care of the patient were reviewed by me and considered in my medical decision making (see chart for details).   Although patient states she's never had this pain before she has had multiple ED visits for abdominal pain in the past.  After reviewing her CT scan she was started on Cipro and Flagyl orally. She was given potassium for her hypokalemia. Although she states her pain isn't better, she appears to be more comfortable and is actually on the phone calling for a ride at 07:58 AM.  Final Clinical Impressions(s) / ED Diagnoses   Final diagnoses:  Nausea vomiting and diarrhea  Enteritis    New Prescriptions New Prescriptions   CIPROFLOXACIN (CIPRO) 500 MG TABLET    Take 1 tablet (500 mg total) by mouth 2 (two) times daily.   DICYCLOMINE (BENTYL) 20 MG TABLET    Take 1 tablet (20 mg total) by mouth 4 (four) times daily -  before meals and at bedtime.   METRONIDAZOLE (FLAGYL) 500 MG TABLET    Take 1 tablet (500 mg total) by mouth 4 (four) times daily.   ONDANSETRON (ZOFRAN) 4 MG TABLET    Take 1 tablet (4 mg total) by mouth every 8 (eight) hours as needed.    Plan discharge  Rolland Porter, MD, Barbette Or, MD 12/15/16 [phone]

## 2016-12-15 NOTE — ED Notes (Signed)
Pt called out stating that she has been here for hours and something could really be wrong with her; this RN in to speak with pt and inform her EDP will be in with her as soon as she is available; pt tearful at this time

## 2016-12-15 NOTE — Discharge Instructions (Signed)
Drink plenty of fluids (clear liquids) the next 1-2 days then start a bland diet such as toast, crackers, jello, [employer] chicken noodle soup. Use the zofran for nausea or vomiting. Take imodium OTC for diarrhea. Avoid milk products until the diarrhea is gone. Take the antibiotics until gone. Use the bentyl for stomach spasms.   Have your doctor recheck you if not improving over the next several days.

## 2017-04-14 ENCOUNTER — Encounter (HOSPITAL_COMMUNITY): Payer: Self-pay | Admitting: Emergency Medicine

## 2017-04-14 ENCOUNTER — Emergency Department (HOSPITAL_COMMUNITY)
Admission: EM | Admit: 2017-04-14 | Discharge: 2017-04-14 | Disposition: A | Discharge status: Home / Self Care | Payer: Self-pay | Attending: Emergency Medicine | Admitting: Emergency Medicine

## 2017-04-14 ENCOUNTER — Emergency Department (HOSPITAL_COMMUNITY): Payer: Self-pay

## 2017-04-14 DIAGNOSIS — Z3A08 8 weeks gestation of pregnancy: Secondary | ICD-10-CM | POA: Insufficient documentation

## 2017-04-14 DIAGNOSIS — F17210 Nicotine dependence, cigarettes, uncomplicated: Secondary | ICD-10-CM | POA: Insufficient documentation

## 2017-04-14 DIAGNOSIS — O26891 Other specified pregnancy related conditions, first trimester: Secondary | ICD-10-CM | POA: Insufficient documentation

## 2017-04-14 DIAGNOSIS — Z79899 Other long term (current) drug therapy: Secondary | ICD-10-CM | POA: Insufficient documentation

## 2017-04-14 DIAGNOSIS — Z3A01 Less than 8 weeks gestation of pregnancy: Secondary | ICD-10-CM

## 2017-04-14 DIAGNOSIS — R1084 Generalized abdominal pain: Secondary | ICD-10-CM | POA: Insufficient documentation

## 2017-04-14 DIAGNOSIS — O211 Hyperemesis gravidarum with metabolic disturbance: Secondary | ICD-10-CM | POA: Insufficient documentation

## 2017-04-14 DIAGNOSIS — O99511 Diseases of the respiratory system complicating pregnancy, first trimester: Secondary | ICD-10-CM | POA: Insufficient documentation

## 2017-04-14 LAB — URINALYSIS, ROUTINE W REFLEX MICROSCOPIC
BILIRUBIN URINE: NEGATIVE
Glucose, UA: NEGATIVE mg/dL
Hgb urine dipstick: NEGATIVE
Ketones, ur: NEGATIVE mg/dL
Leukocytes, UA: NEGATIVE
NITRITE: NEGATIVE
PH: 6 (ref 5.0–8.0)
Protein, ur: NEGATIVE mg/dL
SPECIFIC GRAVITY, URINE: 1.027 (ref 1.005–1.030)

## 2017-04-14 LAB — COMPREHENSIVE METABOLIC PANEL
ALBUMIN: 3.4 g/dL — AB (ref 3.5–5.0)
ALK PHOS: 66 U/L (ref 38–126)
ALT: 16 U/L (ref 14–54)
ANION GAP: 8 (ref 5–15)
AST: 26 U/L (ref 15–41)
BUN: 9 mg/dL (ref 6–20)
CALCIUM: 8.8 mg/dL — AB (ref 8.9–10.3)
CHLORIDE: 105 mmol/L (ref 101–111)
CO2: 23 mmol/L (ref 22–32)
Creatinine, Ser: 0.8 mg/dL (ref 0.44–1.00)
GFR calc Af Amer: >60 mL/min (ref >60)
GFR calc non Af Amer: >60 mL/min (ref >60)
GLUCOSE: 106 mg/dL — AB (ref 65–99)
Potassium: 3.9 mmol/L (ref 3.5–5.1)
SODIUM: 136 mmol/L (ref 135–145)
Total Bilirubin: 0.8 mg/dL (ref 0.3–1.2)
Total Protein: 7 g/dL (ref 6.5–8.1)

## 2017-04-14 LAB — LIPASE, BLOOD: Lipase: 25 U/L (ref 11–51)

## 2017-04-14 LAB — CBC WITH DIFFERENTIAL/PLATELET
BASOS PCT: 0 %
Basophils Absolute: 0 10*3/uL (ref 0.0–0.1)
EOS ABS: 0.2 10*3/uL (ref 0.0–0.7)
Eosinophils Relative: 3 %
HCT: 34.9 % — ABNORMAL LOW (ref 36.0–46.0)
HEMOGLOBIN: 10.9 g/dL — AB (ref 12.0–15.0)
Lymphocytes Relative: 26 %
Lymphs Abs: 1.6 10*3/uL (ref 0.7–4.0)
MCH: 25.1 pg — ABNORMAL LOW (ref 26.0–34.0)
MCHC: 31.2 g/dL (ref 30.0–36.0)
MCV: 80.4 fL (ref 78.0–100.0)
Monocytes Absolute: 0.4 10*3/uL (ref 0.1–1.0)
Monocytes Relative: 6 %
NEUTROS PCT: 65 %
Neutro Abs: 4.1 10*3/uL (ref 1.7–7.7)
PLATELETS: 369 10*3/uL (ref 150–400)
RBC: 4.34 MIL/uL (ref 3.87–5.11)
RDW: 16.4 % — ABNORMAL HIGH (ref 11.5–15.5)
WBC: 6.2 10*3/uL (ref 4.0–10.5)

## 2017-04-14 LAB — I-STAT BETA HCG BLOOD, ED (MC, WL, AP ONLY): HCG, QUANTITATIVE: 1487.9 m[IU]/mL — AB (ref <5)

## 2017-04-14 MED ORDER — SODIUM CHLORIDE 0.9 % IV BOLUS (SEPSIS)
1000.0000 mL | Freq: Once | INTRAVENOUS | Status: AC
  Administered 2017-04-14: 1000 mL via INTRAVENOUS

## 2017-04-14 MED ORDER — GI COCKTAIL ~~LOC~~
30.0000 mL | Freq: Once | ORAL | Status: AC
  Administered 2017-04-14: 30 mL via ORAL
  Filled 2017-04-14: qty 30

## 2017-04-14 MED ORDER — ONDANSETRON HCL 4 MG/2ML IJ SOLN
4.0000 mg | Freq: Once | INTRAMUSCULAR | Status: AC
  Administered 2017-04-14: 4 mg via INTRAVENOUS
  Filled 2017-04-14: qty 2

## 2017-04-14 MED ORDER — MORPHINE SULFATE (PF) 4 MG/ML IV SOLN
4.0000 mg | Freq: Once | INTRAVENOUS | Status: AC
  Administered 2017-04-14: 4 mg via INTRAVENOUS
  Filled 2017-04-14: qty 1

## 2017-04-14 NOTE — ED Notes (Signed)
E-signature not available, pt denies concerns with dc and understands instructions.

## 2017-04-14 NOTE — Discharge Instructions (Signed)
You may take tylenol as prescribed over the counter as needed for pain relief. I recommend drinking fluids to remain hydrated at home and eating a bland diet for the next few days until your symptoms have improved. Take your prenatal vitamin as prescribed.  Call the women's clinic listed below on Monday to schedule a follow up appointment for reevaluation regarding your pregnancy. Today your beta hcg was 1,487.9. Due to early pregnancy, we were unable to confirm a intrauterine pregnancy or rule out ectopic pregnancy. I recommend having a repeat beta hcg performed in 2-3 days and repeat US as needed.  Please return to the Emergency Department if symptoms worsen or new onset of fever, chest pain, difficulty breathing, new/worsening abdominal pain, vomiting, unable to keep fluids down, vaginal bleeding.

## 2017-04-14 NOTE — ED Notes (Signed)
Pt received 4 mg zofran Po by ems

## 2017-04-14 NOTE — ED Notes (Signed)
ED Provider at bedside. 

## 2017-04-14 NOTE — ED Triage Notes (Signed)
Pt was at a party last night and was handed water that tasted funny , pt is c/o burning in her throat and n/v and abd pain

## 2017-04-14 NOTE — ED Provider Notes (Signed)
[facility] DEPT Provider Note   CSN: [id number] Arrival date & time: 04/14/17  7829     History   Chief Complaint Chief Complaint  Patient presents with  . Ingestion    HPI Jodi Mcgee is a 31 y.o. female.  HPI   Patient is a 31 year old female with history of diabetes, anemia, GERD, IBS and ovarian cysts who presents to the ED with complaint of abdominal pain. Patient reports last night she was at a party and states at 7 AM she was handed a bottle of water. She states she can't remember if the water bottle was already opened but states as soon as she started drinking a few sips she felt like the water tasted like bleach and began having a burning sensation down her throat into her abdomen. Endorses associated nausea and NBNB vomiting. Patient denies any alcohol or drug use. Denies taking any medications prior to arrival. Denies fever, chills, headache, cough, chest pain, shortness of breath, hematemesis, diarrhea, urinary symptoms, leg pain, vaginal bleeding or discharge. Endorses abdominal surgical history of appendectomy and C-section.  Past Medical History:  Diagnosis Date  . Anemia   . Anxiety   . Diabetes mellitus without complication ([place])   . GERD (gastroesophageal reflux disease)    has resolved  . IBS (irritable bowel syndrome)   . Ovarian cyst   . Peritonitis, acute generalized ([place])   . Sepsis ([place])     There are no active problems to display for this patient.   Past Surgical History:  Procedure Laterality Date  . APPENDECTOMY     ruptured   . CESAREAN SECTION    . OVARIAN CYST DRAINAGE      OB History    Gravida Para Term Preterm AB Living   2 2 2     2    SAB TAB Ectopic Multiple Live Births           2       Home Medications    Prior to Admission medications   Medication Sig Start Date End Date Taking? Authorizing Provider  naproxen sodium (ANAPROX) 220 MG tablet Take 440 mg by mouth 2 (two) times daily as needed (for pain).   Yes  [provider]  acetaminophen (TYLENOL) 325 MG tablet Take 650 mg by mouth every 6 (six) hours as needed for moderate pain or headache.     [provider]  ciprofloxacin (CIPRO) 500 MG tablet Take 1 tablet (500 mg total) by mouth 2 (two) times daily. Patient not taking: Reported on 04/14/2017 12/15/16   Rolland Porter, MD  dicyclomine (BENTYL) 20 MG tablet Take 1 tablet (20 mg total) by mouth 4 (four) times daily -  before meals and at bedtime. Patient not taking: Reported on 04/14/2017 12/15/16   Rolland Porter, MD  metoCLOPramide (REGLAN) 10 MG tablet Take 1 tablet (10 mg total) by mouth every 6 (six) hours as needed for nausea or vomiting (nausea/headache). Patient not taking: Reported on 04/14/2017 01/16/16   Orlie Dakin, MD  metroNIDAZOLE (FLAGYL) 500 MG tablet Take 1 tablet (500 mg total) by mouth 4 (four) times daily. Patient not taking: Reported on 04/14/2017 12/15/16   Rolland Porter, MD  nitrofurantoin, macrocrystal-monohydrate, (MACROBID) 100 MG capsule Take 1 capsule (100 mg total) by mouth 2 (two) times daily. X 7 days Patient not taking: Reported on 5/62/1308 6/57/84   Delora Fuel, MD  ondansetron (ZOFRAN ODT) 4 MG disintegrating tablet Take 1 tablet (4 mg total) by mouth every 8 (eight)  hours as needed for nausea. Patient not taking: Reported on 04/14/2017 11/04/15   Julianne Rice, MD  ondansetron (ZOFRAN) 4 MG tablet Take 1 tablet (4 mg total) by mouth every 8 (eight) hours as needed. Patient not taking: Reported on 04/14/2017 12/15/16   Rolland Porter, MD  potassium chloride 20 MEQ TBCR Take 20 mEq by mouth 2 (two) times daily. Patient not taking: Reported on 04/14/2017 12/15/16   Rolland Porter, MD  traMADol (ULTRAM) 50 MG tablet Take 1 tablet (50 mg total) by mouth every 6 (six) hours as needed. Patient not taking: Reported on 04/21/1600 0/93/23   Delora Fuel, MD    Family History History reviewed. No pertinent family history.  Social History Social History  Substance Use  Topics  . Smoking status: Current Every Day Smoker    Packs/day: 0.50    Types: Cigarettes  . Smokeless tobacco: Never Used  . Alcohol use Yes     Comment: occassional     Allergies   Contrast media [iodinated diagnostic agents]   Review of Systems Review of Systems  HENT: Positive for sore throat.   Gastrointestinal: Positive for abdominal pain, nausea and vomiting.  All other systems reviewed and are negative.    Physical Exam Updated Vital Signs BP 117/88   Pulse 88   Temp 98.3 F (36.8 C) (Oral)   Resp 18   SpO2 100%   Physical Exam  Constitutional: She is oriented to person, place, and time. She appears well-developed and well-nourished. No distress.  HENT:  Head: Normocephalic and atraumatic.  Mouth/Throat: Uvula is midline, oropharynx is clear and moist and mucous membranes are normal. No oropharyngeal exudate, posterior oropharyngeal edema, posterior oropharyngeal erythema or tonsillar abscesses. No tonsillar exudate.  Eyes: Conjunctivae and EOM are normal. Right eye exhibits no discharge. Left eye exhibits no discharge. No scleral icterus.  Neck: Normal range of motion. Neck supple.  Cardiovascular: Normal rate, regular rhythm, normal heart sounds and intact distal pulses.   Pulmonary/Chest: Effort normal and breath sounds normal. No respiratory distress. She has no wheezes. She has no rales. She exhibits no tenderness.  Abdominal: Soft. Normal appearance and bowel sounds are normal. She exhibits no distension and no mass. There is tenderness. There is no rigidity, no rebound, no guarding and no CVA tenderness. No hernia.  Mild diffuse abdominal pain.  Musculoskeletal: Normal range of motion. She exhibits no edema.  Neurological: She is alert and oriented to person, place, and time.  Skin: Skin is warm and dry. She is not diaphoretic.  Nursing note and vitals reviewed.    ED Treatments / Results  Labs (all labs ordered are listed, but only abnormal results  are displayed) Labs Reviewed  CBC WITH DIFFERENTIAL/PLATELET - Abnormal; Notable for the following:       Result Value   Hemoglobin 10.9 (*)    HCT 34.9 (*)    MCH 25.1 (*)    RDW 16.4 (*)    All other components within normal limits  COMPREHENSIVE METABOLIC PANEL - Abnormal; Notable for the following:    Glucose, Bld 106 (*)    Calcium 8.8 (*)    Albumin 3.4 (*)    All other components within normal limits  URINALYSIS, ROUTINE W REFLEX MICROSCOPIC - Abnormal; Notable for the following:    APPearance HAZY (*)    All other components within normal limits  I-STAT BETA HCG BLOOD, ED ([facility], [facility], [facility] ONLY) - Abnormal; Notable for the following:    I-stat hCG, quantitative 1,487.9 (*)  All other components within normal limits  LIPASE, BLOOD    EKG  EKG Interpretation None       Radiology US Ob Comp Less 14 Wks  Result Date: 04/14/2017 CLINICAL DATA:  Lower pelvic and abdominal pain bilaterally for 1 week. Positive beta HCGs EXAM: OBSTETRIC <14 WK [place] AND TRANSVAGINAL OB US TECHNIQUE: Both transabdominal and transvaginal ultrasound examinations were performed for complete evaluation of the gestation as well as the maternal uterus, adnexal regions, and pelvic cul-de-sac. Transvaginal technique was performed to assess early pregnancy. COMPARISON:  None. FINDINGS: Intrauterine gestational sac: None Yolk sac:  Not Visualized. Embryo:  Not Visualized. Subchorionic hemorrhage:  None visualized. Maternal uterus/adnexae: 2 mildly complex cysts are seen in the left ovary with the largest measuring 2.6 cm. One of these may represent a corpus luteum cyst. The right ovary is normal in appearance. There is a 2.3 cm fibroid anteriorly in the uterus. No suspicious mass seen in the pelvis. Minimal fluid in the cul-de-sac is likely physiologic. IMPRESSION: 1. No intrauterine pregnancy identified. In the setting of a positive beta HCG, the lack of an intrauterine pregnancy can be seen in the setting of ectopic  pregnancy, early pregnancy, or recent miscarriage. Recommend clinical correlation and follow-up as clinically warranted. Electronically Signed   By: Dorise Bullion III M.D   On: 04/14/2017 12:29   US Ob Transvaginal  Result Date: 04/14/2017 CLINICAL DATA:  Lower pelvic and abdominal pain bilaterally for 1 week. Positive beta HCGs EXAM: OBSTETRIC <14 WK [place] AND TRANSVAGINAL OB US TECHNIQUE: Both transabdominal and transvaginal ultrasound examinations were performed for complete evaluation of the gestation as well as the maternal uterus, adnexal regions, and pelvic cul-de-sac. Transvaginal technique was performed to assess early pregnancy. COMPARISON:  None. FINDINGS: Intrauterine gestational sac: None Yolk sac:  Not Visualized. Embryo:  Not Visualized. Subchorionic hemorrhage:  None visualized. Maternal uterus/adnexae: 2 mildly complex cysts are seen in the left ovary with the largest measuring 2.6 cm. One of these may represent a corpus luteum cyst. The right ovary is normal in appearance. There is a 2.3 cm fibroid anteriorly in the uterus. No suspicious mass seen in the pelvis. Minimal fluid in the cul-de-sac is likely physiologic. IMPRESSION: 1. No intrauterine pregnancy identified. In the setting of a positive beta HCG, the lack of an intrauterine pregnancy can be seen in the setting of ectopic pregnancy, early pregnancy, or recent miscarriage. Recommend clinical correlation and follow-up as clinically warranted. Electronically Signed   By: Dorise Bullion III M.D   On: 04/14/2017 12:29    Procedures Procedures (including critical care time)  Medications Ordered in ED Medications  sodium chloride 0.9 % bolus 1,000 mL (0 mLs Intravenous Stopped 04/14/17 1247)  ondansetron (ZOFRAN) injection 4 mg (4 mg Intravenous Given 04/14/17 0948)  gi cocktail (Maalox,Lidocaine,Donnatal) (30 mLs Oral Given 04/14/17 0947)  morphine 4 MG/ML injection 4 mg (4 mg Intravenous Given 04/14/17 1033)     Initial  Impression / Assessment and Plan / ED Course  I have reviewed the triage vital signs and the nursing notes.  Pertinent labs & imaging results that were available during my care of the patient were reviewed by me and considered in my medical decision making (see chart for details).     Patient presents with abdominal pain with associated nausea and vomiting that started this morning after drinking from a bottle that she initially thought was water but reports tasted like bleach. If she had burning sensation down her throat. Denies fever.  VSS. Exam revealed mild diffuse abdominal tenderness, no peritoneal signs. Remaining exam unremarkable. Patient given IV fluids, pain meds and antiemetics. Pregnancy positive, beta hCG 1487. Remaining labs and urine unremarkable, no signs of infection. Discussed results with patient. Due to patient continuing to endorse abdominal pain and newly diagnosed pregnancy will order OB ultrasound for further evaluation of IUP versus ectopic. OB ultrasound showed no identified IUP, suspect due to early onset pregnancy. Discussed results with pt. Advised for pt to follow up with OBGYN on Monday for follow up evaluation and repeat beta hcg to confirm IUP. Advised pt to start taking prenatal vitamins. No indication of appendicitis, bowel obstruction, bowel perforation, cholecystitis, diverticulitis; do not feel that any further workup is warranted at this time. Pt tolerating PO. Plan to d/c pt home with symptomatic tx and outpatient follow up. Discussed return precautions.      Final Clinical Impressions(s) / ED Diagnoses   Final diagnoses:  Less than [redacted] weeks gestation of pregnancy  Generalized abdominal pain    New Prescriptions Discharge Medication List as of 04/14/2017  1:17 PM       Nona Dell, PA-C 04/14/17 1535    Daleen Bo, MD 04/22/17 [phone]

## 2017-04-18 ENCOUNTER — Emergency Department (HOSPITAL_COMMUNITY)
Admission: EM | Admit: 2017-04-18 | Discharge: 2017-04-18 | Disposition: A | Discharge status: Home / Self Care | Payer: Self-pay | Attending: Emergency Medicine | Admitting: Emergency Medicine

## 2017-04-18 ENCOUNTER — Emergency Department (HOSPITAL_COMMUNITY): Payer: Self-pay

## 2017-04-18 ENCOUNTER — Encounter (HOSPITAL_COMMUNITY): Payer: Self-pay

## 2017-04-18 DIAGNOSIS — Z3490 Encounter for supervision of normal pregnancy, unspecified, unspecified trimester: Secondary | ICD-10-CM

## 2017-04-18 DIAGNOSIS — F17210 Nicotine dependence, cigarettes, uncomplicated: Secondary | ICD-10-CM | POA: Insufficient documentation

## 2017-04-18 DIAGNOSIS — R102 Pelvic and perineal pain: Secondary | ICD-10-CM

## 2017-04-18 DIAGNOSIS — N83202 Unspecified ovarian cyst, left side: Secondary | ICD-10-CM

## 2017-04-18 DIAGNOSIS — E119 Type 2 diabetes mellitus without complications: Secondary | ICD-10-CM | POA: Insufficient documentation

## 2017-04-18 DIAGNOSIS — N760 Acute vaginitis: Secondary | ICD-10-CM | POA: Insufficient documentation

## 2017-04-18 DIAGNOSIS — B9689 Other specified bacterial agents as the cause of diseases classified elsewhere: Secondary | ICD-10-CM

## 2017-04-18 LAB — BASIC METABOLIC PANEL
Anion gap: 8 (ref 5–15)
BUN: 10 mg/dL (ref 6–20)
CALCIUM: 8.6 mg/dL — AB (ref 8.9–10.3)
CO2: 24 mmol/L (ref 22–32)
Chloride: 103 mmol/L (ref 101–111)
Creatinine, Ser: 0.7 mg/dL (ref 0.44–1.00)
GFR calc Af Amer: >60 mL/min (ref >60)
GLUCOSE: 98 mg/dL (ref 65–99)
POTASSIUM: 3.3 mmol/L — AB (ref 3.5–5.1)
SODIUM: 135 mmol/L (ref 135–145)

## 2017-04-18 LAB — URINALYSIS, ROUTINE W REFLEX MICROSCOPIC
BACTERIA UA: NONE SEEN
Glucose, UA: NEGATIVE mg/dL
Hgb urine dipstick: NEGATIVE
Ketones, ur: 5 mg/dL — AB
Leukocytes, UA: NEGATIVE
Nitrite: NEGATIVE
PROTEIN: 30 mg/dL — AB
SPECIFIC GRAVITY, URINE: 1.032 — AB (ref 1.005–1.030)
pH: 5 (ref 5.0–8.0)

## 2017-04-18 LAB — CBC WITH DIFFERENTIAL/PLATELET
BASOS ABS: 0 10*3/uL (ref 0.0–0.1)
BASOS PCT: 0 %
EOS ABS: 0.1 10*3/uL (ref 0.0–0.7)
EOS PCT: 2 %
HCT: 34.5 % — ABNORMAL LOW (ref 36.0–46.0)
Hemoglobin: 10.9 g/dL — ABNORMAL LOW (ref 12.0–15.0)
LYMPHS PCT: 25 %
Lymphs Abs: 1.6 10*3/uL (ref 0.7–4.0)
MCH: 25.1 pg — ABNORMAL LOW (ref 26.0–34.0)
MCHC: 31.6 g/dL (ref 30.0–36.0)
MCV: 79.5 fL (ref 78.0–100.0)
MONO ABS: 0.3 10*3/uL (ref 0.1–1.0)
Monocytes Relative: 5 %
Neutro Abs: 4.2 10*3/uL (ref 1.7–7.7)
Neutrophils Relative %: 68 %
PLATELETS: 354 10*3/uL (ref 150–400)
RBC: 4.34 MIL/uL (ref 3.87–5.11)
RDW: 16.2 % — AB (ref 11.5–15.5)
WBC: 6.2 10*3/uL (ref 4.0–10.5)

## 2017-04-18 LAB — WET PREP, GENITAL
SPERM: NONE SEEN
Trich, Wet Prep: NONE SEEN
Yeast Wet Prep HPF POC: NONE SEEN

## 2017-04-18 LAB — HCG, QUANTITATIVE, PREGNANCY: HCG, BETA CHAIN, QUANT, S: 5221 m[IU]/mL — AB (ref <5)

## 2017-04-18 LAB — ABO/RH: ABO/RH(D): O POS

## 2017-04-18 MED ORDER — METRONIDAZOLE 500 MG PO TABS: 500.0000 mg | ORAL_TABLET | Freq: Two times a day (BID) | ORAL | 0 refills | Status: DC

## 2017-04-18 MED ORDER — ACETAMINOPHEN 325 MG PO TABS
650.0000 mg | ORAL_TABLET | Freq: Once | ORAL | Status: AC
  Administered 2017-04-18: 650 mg via ORAL
  Filled 2017-04-18: qty 2

## 2017-04-18 NOTE — Discharge Instructions (Signed)
Your ultrasound today showed a single intrauterine pregnancy appoximately 5 weeks and 36 days old. Take over the counter tylenol, as directed on packaging, as needed for discomfort. Avoid strenuous activity and do NOT place anything into your vagina, ie: no douching, no tampons, no sexual intercourse, no swimming or tub baths, until you are seen in follow up by your regular OB/GYN doctor.  Call your regular OB/GYN doctor today to schedule a follow up appointment in the next 3 days.  Return to the Emergency Department immediately if worsening.

## 2017-04-18 NOTE — ED Triage Notes (Signed)
Pt reports she had positive pregnancy test at [facility] on Saturday.  Reports had [place] but was told they couldn't see anything in uterus or tubes.  Pt says has been spotting, none today, but pain is worse today.  Pt denies diarrhea but reports vomiting and  frequent urination.  LMP was May 19th.

## 2017-04-18 NOTE — ED Provider Notes (Signed)
[facility] DEPT Provider Note   CSN: [id number] Arrival date & time: 04/18/17  0741     History   Chief Complaint Chief Complaint  Patient presents with  . Pelvic Pain    HPI Jodi Mcgee is a 31 y.o. female.  HPI  Pt was seen at 0755.  Per pt, c/o gradual onset and persistence of constant left sided pelvic "pain" for the past 4 days.  Has been associated with multiple intermittent episodes of N/V and vaginal "spotting."  Denies diarrhea, no fevers, no back pain, no rash, no CP/SOB, no black or blood in stools or emesis. Pt was evaluated at [facility] 4 days ago for her symptoms, dx pregnancy. States the "ultrasound didn't show the pregnancy because I was too early." LMP 03/10/2017.    Past Medical History:  Diagnosis Date  . Anemia   . Anxiety   . Diabetes mellitus without complication ([place])   . GERD (gastroesophageal reflux disease)    has resolved  . IBS (irritable bowel syndrome)   . Ovarian cyst   . Peritonitis, acute generalized ([place])   . Sepsis ([place])     There are no active problems to display for this patient.   Past Surgical History:  Procedure Laterality Date  . APPENDECTOMY     ruptured   . CESAREAN SECTION    . OVARIAN CYST DRAINAGE      OB History    Gravida Para Term Preterm AB Living   3 2 2     2    SAB TAB Ectopic Multiple Live Births           2       Home Medications    Prior to Admission medications   Medication Sig Start Date End Date Taking? Authorizing Provider  acetaminophen (TYLENOL) 325 MG tablet Take 650 mg by mouth every 6 (six) hours as needed for moderate pain or headache.     [provider]  ciprofloxacin (CIPRO) 500 MG tablet Take 1 tablet (500 mg total) by mouth 2 (two) times daily. Patient not taking: Reported on 04/14/2017 12/15/16   Rolland Porter, MD  dicyclomine (BENTYL) 20 MG tablet Take 1 tablet (20 mg total) by mouth 4 (four) times daily -  before meals and at bedtime. Patient not taking: Reported on 04/14/2017  12/15/16   Rolland Porter, MD  metoCLOPramide (REGLAN) 10 MG tablet Take 1 tablet (10 mg total) by mouth every 6 (six) hours as needed for nausea or vomiting (nausea/headache). Patient not taking: Reported on 04/14/2017 01/16/16   Orlie Dakin, MD  metroNIDAZOLE (FLAGYL) 500 MG tablet Take 1 tablet (500 mg total) by mouth 4 (four) times daily. Patient not taking: Reported on 04/14/2017 12/15/16   Rolland Porter, MD  naproxen sodium (ANAPROX) 220 MG tablet Take 440 mg by mouth 2 (two) times daily as needed (for pain).    [provider]  nitrofurantoin, macrocrystal-monohydrate, (MACROBID) 100 MG capsule Take 1 capsule (100 mg total) by mouth 2 (two) times daily. X 7 days Patient not taking: Reported on 9/73/5329 07/16/25   Delora Fuel, MD  ondansetron (ZOFRAN ODT) 4 MG disintegrating tablet Take 1 tablet (4 mg total) by mouth every 8 (eight) hours as needed for nausea. Patient not taking: Reported on 04/14/2017 11/04/15   Julianne Rice, MD  ondansetron (ZOFRAN) 4 MG tablet Take 1 tablet (4 mg total) by mouth every 8 (eight) hours as needed. Patient not taking: Reported on 04/14/2017 12/15/16   Rolland Porter, MD  potassium  chloride 20 MEQ TBCR Take 20 mEq by mouth 2 (two) times daily. Patient not taking: Reported on 04/14/2017 12/15/16   Rolland Porter, MD  traMADol (ULTRAM) 50 MG tablet Take 1 tablet (50 mg total) by mouth every 6 (six) hours as needed. Patient not taking: Reported on 8/54/6270 3/50/09   Delora Fuel, MD    Family History No family history on file.  Social History Social History  Substance Use Topics  . Smoking status: Current Every Day Smoker    Packs/day: 0.50    Types: Cigarettes  . Smokeless tobacco: Never Used  . Alcohol use Yes     Comment: occassional     Allergies   Contrast media [iodinated diagnostic agents]   Review of Systems Review of Systems ROS: Statement: All systems negative except as marked or noted in the HPI; Constitutional: Negative for fever and  chills. ; ; Eyes: Negative for eye pain, redness and discharge. ; ; ENMT: Negative for ear pain, hoarseness, nasal congestion, sinus pressure and sore throat. ; ; Cardiovascular: Negative for chest pain, palpitations, diaphoresis, dyspnea and peripheral edema. ; ; Respiratory: Negative for cough, wheezing and stridor. ; ; Gastrointestinal: +N/V. Negative for diarrhea, blood in stool, hematemesis, jaundice and rectal bleeding. . ; ; Genitourinary: Negative for dysuria, flank pain and hematuria. ; ; GYN:  +pelvic pain, +vaginal bleeding, no vaginal discharge, no vulvar pain. ;; Musculoskeletal: Negative for back pain and neck pain. Negative for swelling and trauma.; ; Skin: Negative for pruritus, rash, abrasions, blisters, bruising and skin lesion.; ; Neuro: Negative for headache, lightheadedness and neck stiffness. Negative for weakness, altered level of consciousness, altered mental status, extremity weakness, paresthesias, involuntary movement, seizure and syncope.       Physical Exam Updated Vital Signs BP 110/72 (BP Location: Right Arm)   Pulse 87   Temp 98.3 F (36.8 C) (Oral)   Resp 18   Ht 5\' 7"  (1.702 m)   Wt 99.8 kg (220 lb)   LMP 03/10/2017   SpO2 100%   BMI 34.46 kg/m   Physical Exam 0800: Physical examination:  Nursing notes reviewed; Vital signs and O2 SAT reviewed;  Constitutional: Well developed, Well nourished, Well hydrated, Uncomfortable appearing.; Head:  Normocephalic, atraumatic; Eyes: EOMI, PERRL, No scleral icterus; ENMT: Mouth and pharynx normal, Mucous membranes moist; Neck: Supple, Full range of motion, No lymphadenopathy; Cardiovascular: Regular rate and rhythm, No gallop; Respiratory: Breath sounds clear & equal bilaterally, No wheezes.  Speaking full sentences with ease, Normal respiratory effort/excursion; Chest: Nontender, Movement normal; Abdomen: Soft,+left pelvic tenderness to palp. No rebound or guarding. Nondistended, Normal bowel sounds; Genitourinary: No CVA  tenderness. Pelvic exam performed with permission of pt and female ED tech assist during exam.  External genitalia w/o lesions. Vaginal vault with thin yellow discharge.  Cervix w/o lesions, not friable, GC/chlam and wet prep obtained and sent to lab.  Bimanual exam w/o CMT, +suprapubic and left adnexal tenderness.;; Extremities: Pulses normal, No tenderness, No edema, No calf edema or asymmetry.; Neuro: AA&Ox3, Major CN grossly intact.  Speech clear. No gross focal motor or sensory deficits in extremities.; Skin: Color normal, Warm, Dry.   ED Treatments / Results  Labs (all labs ordered are listed, but only abnormal results are displayed)   EKG  EKG Interpretation None       Radiology   Procedures Procedures (including critical care time)  Medications Ordered in ED Medications - No data to display   Initial Impression / Assessment and Plan / ED Course  I have reviewed the triage vital signs and the nursing notes.  Pertinent labs & imaging results that were available during my care of the patient were reviewed by me and considered in my medical decision making (see chart for details).  MDM Reviewed: previous chart, nursing note and vitals Reviewed previous: labs Interpretation: labs and ultrasound   Results for orders placed or performed during the hospital encounter of 04/18/17  Wet prep, genital  Result Value Ref Range   Yeast Wet Prep HPF POC NONE SEEN NONE SEEN   Trich, Wet Prep NONE SEEN NONE SEEN   Clue Cells Wet Prep HPF POC PRESENT (A) NONE SEEN   WBC, Wet Prep HPF POC MODERATE (A) NONE SEEN   Sperm NONE SEEN   Urinalysis, Routine w reflex microscopic  Result Value Ref Range   Color, Urine YELLOW YELLOW   APPearance HAZY (A) CLEAR   Specific Gravity, Urine 1.032 (H) 1.005 - 1.030   pH 5.0 5.0 - 8.0   Glucose, UA NEGATIVE NEGATIVE mg/dL   Hgb urine dipstick NEGATIVE NEGATIVE   Bilirubin Urine SMALL (A) NEGATIVE   Ketones, ur 5 (A) NEGATIVE mg/dL   Protein,  ur 30 (A) NEGATIVE mg/dL   Nitrite NEGATIVE NEGATIVE   Leukocytes, UA NEGATIVE NEGATIVE   RBC / HPF 0-5 0 - 5 RBC/hpf   WBC, UA 0-5 0 - 5 WBC/hpf   Bacteria, UA NONE SEEN NONE SEEN   Squamous Epithelial / LPF 6-30 (A) NONE SEEN   Mucous PRESENT   hCG, quantitative, pregnancy  Result Value Ref Range   hCG, Beta Chain, Quant, S 5,221 (H) <5 mIU/mL  Basic metabolic panel  Result Value Ref Range   Sodium 135 135 - 145 mmol/L   Potassium 3.3 (L) 3.5 - 5.1 mmol/L   Chloride 103 101 - 111 mmol/L   CO2 24 22 - 32 mmol/L   Glucose, Bld 98 65 - 99 mg/dL   BUN 10 6 - 20 mg/dL   Creatinine, Ser 0.70 0.44 - 1.00 mg/dL   Calcium 8.6 (L) 8.9 - 10.3 mg/dL   GFR calc non Af Amer >60 >60 mL/min   GFR calc Af Amer >60 >60 mL/min   Anion gap 8 5 - 15  CBC with Differential  Result Value Ref Range   WBC 6.2 4.0 - 10.5 K/uL   RBC 4.34 3.87 - 5.11 MIL/uL   Hemoglobin 10.9 (L) 12.0 - 15.0 g/dL   HCT 34.5 (L) 36.0 - 46.0 %   MCV 79.5 78.0 - 100.0 fL   MCH 25.1 (L) 26.0 - 34.0 pg   MCHC 31.6 30.0 - 36.0 g/dL   RDW 16.2 (H) 11.5 - 15.5 %   Platelets 354 150 - 400 K/uL   Neutrophils Relative % 68 %   Neutro Abs 4.2 1.7 - 7.7 K/uL   Lymphocytes Relative 25 %   Lymphs Abs 1.6 0.7 - 4.0 K/uL   Monocytes Relative 5 %   Monocytes Absolute 0.3 0.1 - 1.0 K/uL   Eosinophils Relative 2 %   Eosinophils Absolute 0.1 0.0 - 0.7 K/uL   Basophils Relative 0 %   Basophils Absolute 0.0 0.0 - 0.1 K/uL  ABO/Rh  Result Value Ref Range   ABO/RH(D) O POS    US Ob Comp Less 14 Wks Result Date: 04/18/2017 CLINICAL DATA:  Pelvic pain, positive pregnancy test. EXAM: OBSTETRIC <14 WK [place] AND TRANSVAGINAL OB US DOPPLER ULTRASOUND OF OVARIES TECHNIQUE: Both transabdominal and transvaginal ultrasound examinations were performed for  complete evaluation of the gestation as well as the maternal uterus, adnexal regions, and pelvic cul-de-sac. Transvaginal technique was performed to assess early pregnancy. Color and duplex  Doppler ultrasound was utilized to evaluate blood flow to the ovaries. COMPARISON:  04/14/2017 FINDINGS: Intrauterine gestational sac: Single Yolk sac:  Not visualized Embryo:  Not visualized Cardiac Activity: Not visualized Heart Rate:  bpm MSD: 7.4  mm   5 w   3  d CRL:     mm    w  d                  [place] EDC: Subchorionic hemorrhage:  None visualized. Maternal uterus/adnexae: 2 cystic areas within the left ovary, measuring up to 3.3 cm and 3.0 cm. These appeared represent simple cysts. Pulsed Doppler evaluation of both ovaries demonstrates normal appearing low-resistance arterial and venous waveforms. IMPRESSION: Early intrauterine pregnancy, 5 weeks 3 days by mean sac diameter. No yolk sac or fetal pole currently. This could be followed with repeat ultrasound in 14 days to ensure expected progression. Small left ovarian simple cysts.  No evidence of torsion. Electronically Signed   By: Rolm Baptise M.D.   On: 04/18/2017 09:55    1230:  Will tx for BV. [place] with +IUP.  Dx and testing d/w pt.  Questions answered.  Verb understanding, agreeable to d/c home with outpt f/u.    Final Clinical Impressions(s) / ED Diagnoses   Final diagnoses:  Pelvic pain    New Prescriptions New Prescriptions   No medications on file     Francine Graven, DO 04/21/17 7622

## 2017-04-19 LAB — URINE CULTURE: Culture: <10000 — AB

## 2017-04-19 LAB — GC/CHLAMYDIA PROBE AMP (~~LOC~~) NOT AT ARMC
CHLAMYDIA, DNA PROBE: NEGATIVE
NEISSERIA GONORRHEA: NEGATIVE

## 2017-06-19 ENCOUNTER — Inpatient Hospital Stay (HOSPITAL_COMMUNITY)
Admission: AD | Admit: 2017-06-19 | Discharge: 2017-06-19 | Disposition: A | Discharge status: Home / Self Care | Payer: Medicaid Other | Source: Ambulatory Visit | Attending: Family Medicine | Admitting: Family Medicine

## 2017-06-19 ENCOUNTER — Encounter (HOSPITAL_COMMUNITY): Payer: Self-pay | Admitting: *Deleted

## 2017-06-19 DIAGNOSIS — Z3492 Encounter for supervision of normal pregnancy, unspecified, second trimester: Secondary | ICD-10-CM

## 2017-06-19 DIAGNOSIS — N760 Acute vaginitis: Secondary | ICD-10-CM | POA: Diagnosis not present

## 2017-06-19 DIAGNOSIS — O34219 Maternal care for unspecified type scar from previous cesarean delivery: Secondary | ICD-10-CM | POA: Insufficient documentation

## 2017-06-19 DIAGNOSIS — O23592 Infection of other part of genital tract in pregnancy, second trimester: Secondary | ICD-10-CM | POA: Insufficient documentation

## 2017-06-19 DIAGNOSIS — R109 Unspecified abdominal pain: Secondary | ICD-10-CM | POA: Diagnosis not present

## 2017-06-19 DIAGNOSIS — Z91041 Radiographic dye allergy status: Secondary | ICD-10-CM | POA: Diagnosis not present

## 2017-06-19 DIAGNOSIS — O26899 Other specified pregnancy related conditions, unspecified trimester: Secondary | ICD-10-CM

## 2017-06-19 DIAGNOSIS — Z87891 Personal history of nicotine dependence: Secondary | ICD-10-CM | POA: Insufficient documentation

## 2017-06-19 DIAGNOSIS — Z3A14 14 weeks gestation of pregnancy: Secondary | ICD-10-CM

## 2017-06-19 DIAGNOSIS — O26892 Other specified pregnancy related conditions, second trimester: Secondary | ICD-10-CM | POA: Diagnosis not present

## 2017-06-19 DIAGNOSIS — B9689 Other specified bacterial agents as the cause of diseases classified elsewhere: Secondary | ICD-10-CM

## 2017-06-19 DIAGNOSIS — R102 Pelvic and perineal pain: Secondary | ICD-10-CM | POA: Diagnosis present

## 2017-06-19 LAB — URINALYSIS, ROUTINE W REFLEX MICROSCOPIC
BILIRUBIN URINE: NEGATIVE
GLUCOSE, UA: NEGATIVE mg/dL
Hgb urine dipstick: NEGATIVE
KETONES UR: NEGATIVE mg/dL
LEUKOCYTES UA: NEGATIVE
Nitrite: NEGATIVE
PH: 6 (ref 5.0–8.0)
Protein, ur: NEGATIVE mg/dL
Specific Gravity, Urine: 1.018 (ref 1.005–1.030)

## 2017-06-19 LAB — WET PREP, GENITAL
Sperm: NONE SEEN
Trich, Wet Prep: NONE SEEN
Yeast Wet Prep HPF POC: NONE SEEN

## 2017-06-19 MED ORDER — METRONIDAZOLE 500 MG PO TABS
500.0000 mg | ORAL_TABLET | Freq: Two times a day (BID) | ORAL | 0 refills | Status: DC
Start: 2017-06-19 — End: 2017-10-17

## 2017-06-19 NOTE — Discharge Instructions (Signed)
Recommend pregnancy support belt/maternity support belt.  One brand is called Prenatal Cradle You may find these on [employer], [employer], or [employer]     Abdominal Pain During Pregnancy Abdominal pain is common in pregnancy. Most of the time, it does not cause harm. There are many causes of abdominal pain. Some causes are more serious than others and sometimes the cause is not known. Abdominal pain can be a sign that something is very wrong with the pregnancy or the pain may have nothing to do with the pregnancy. Always tell your health care provider if you have any abdominal pain. Follow these instructions at home:  Do not have sex or put anything in your vagina until your symptoms go away completely.  Watch your abdominal pain for any changes.  Get plenty of rest until your pain improves.  Drink enough fluid to keep your urine clear or pale yellow.  Take over-the-counter or prescription medicines only as told by your health care provider.  Keep all follow-up visits as told by your health care provider. This is important. Contact a health care provider if:  You have a fever.  Your pain gets worse or you have cramping.  Your pain continues after resting. Get help right away if:  You are bleeding, leaking fluid, or passing tissue from the vagina.  You have vomiting or diarrhea that does not go away.  You have painful or bloody urination.  You notice a decrease in your baby's movements.  You feel very weak or faint.  You have shortness of breath.  You develop a severe headache with abdominal pain.  You have abnormal vaginal discharge with abdominal pain. This information is not intended to replace advice given to you by your health care provider. Make sure you discuss any questions you have with your health care provider. Document Released: 10/09/2005 Document Revised: 07/20/2016 Document Reviewed: 05/08/2013 Elsevier Interactive Patient Education  [employer].

## 2017-06-19 NOTE — MAU Note (Signed)
Pt presents to MAU for pelvic pain since Saturday. Denies any VB or abnormal discharge. Pt states she has not started [facility] yet

## 2017-06-19 NOTE — MAU Provider Note (Signed)
History     CSN: [id number]  Arrival date and time: 06/19/17 4166     Chief Complaint  Patient presents with  . Pelvic Pain   HPI Jodi Mcgee is a 31 y.o. G3P2002 at [redacted]w[redacted]d who presents with pelvic pain. Symptoms began on Saturday. Reports lower abdominal pain that is constant & feels like pressure. Rates pain 9/10. Has taken tylenol without relief. Nothing makes pain better or worse. Denies n/v/d, vaginal bleeding, dysuria, constipation, or recent intercourse. Has not started prenatal care.   Has not started prenatal care.   OB History    Gravida Para Term Preterm AB Living   3 2 2     2    SAB TAB Ectopic Multiple Live Births           2      Past Medical History:  Diagnosis Date  . Anemia   . Anxiety   . Diabetes mellitus without complication ([place])   . GERD (gastroesophageal reflux disease)    has resolved  . IBS (irritable bowel syndrome)   . Ovarian cyst   . Peritonitis, acute generalized ([place])   . Sepsis [facility])     Past Surgical History:  Procedure Laterality Date  . APPENDECTOMY     ruptured   . CESAREAN SECTION    . OVARIAN CYST DRAINAGE      History reviewed. No pertinent family history.  Social History  Substance Use Topics  . Smoking status: Former Smoker    Packs/day: 0.50    Types: Cigarettes  . Smokeless tobacco: Never Used  . Alcohol use Yes     Comment: occassional    Allergies:  Allergies  Allergen Reactions  . Contrast Media [Iodinated Diagnostic Agents] Itching    No prescriptions prior to admission.    Review of Systems  Constitutional: Negative.   Gastrointestinal: Positive for abdominal pain. Negative for constipation, diarrhea, nausea and vomiting.  Genitourinary: Positive for vaginal discharge. Negative for dysuria and vaginal bleeding.   Physical Exam   Blood pressure 126/73, pulse 92, temperature 98.2 F (36.8 C), resp. rate 18, height 5\' 8"  (1.727 m), weight 230 lb (104.3 kg), last menstrual period  03/10/2017.  Physical Exam  Nursing note and vitals reviewed. Constitutional: She is oriented to person, place, and time. She appears well-developed and well-nourished. No distress.  HENT:  Head: Normocephalic and atraumatic.  Eyes: Conjunctivae are normal. Right eye exhibits no discharge. Left eye exhibits no discharge. No scleral icterus.  Neck: Normal range of motion.  Cardiovascular: Normal rate, regular rhythm and normal heart sounds.   No murmur heard. Respiratory: Effort normal and breath sounds normal. No respiratory distress. She has no wheezes.  GI: Soft. Bowel sounds are normal. There is no tenderness.  Genitourinary: Uterus is enlarged. Cervix exhibits no motion tenderness and no friability. Right adnexum displays no mass, no tenderness and no fullness. Left adnexum displays tenderness. Left adnexum displays no mass and no fullness. No bleeding in the vagina. Vaginal discharge (moderate amount of thin white discharge) found.  Genitourinary Comments: Cervix closed  Neurological: She is alert and oriented to person, place, and time.  Skin: Skin is warm and dry. She is not diaphoretic.  Psychiatric: She has a normal mood and affect. Her behavior is normal. Judgment and thought content normal.    MAU Course  Procedures Results for orders placed or performed during the hospital encounter of 06/19/17 (from the past 24 hour(s))  Urinalysis, Routine w reflex microscopic  Status: Abnormal   Collection Time: 06/19/17  8:00 AM  Result Value Ref Range   Color, Urine YELLOW YELLOW   APPearance HAZY (A) CLEAR   Specific Gravity, Urine 1.018 1.005 - 1.030   pH 6.0 5.0 - 8.0   Glucose, UA NEGATIVE NEGATIVE mg/dL   Hgb urine dipstick NEGATIVE NEGATIVE   Bilirubin Urine NEGATIVE NEGATIVE   Ketones, ur NEGATIVE NEGATIVE mg/dL   Protein, ur NEGATIVE NEGATIVE mg/dL   Nitrite NEGATIVE NEGATIVE   Leukocytes, UA NEGATIVE NEGATIVE  Wet prep, genital     Status: Abnormal   Collection Time:  06/19/17  9:50 AM  Result Value Ref Range   Yeast Wet Prep HPF POC NONE SEEN NONE SEEN   Trich, Wet Prep NONE SEEN NONE SEEN   Clue Cells Wet Prep HPF POC PRESENT (A) NONE SEEN   WBC, Wet Prep HPF POC MODERATE (A) NONE SEEN   Sperm NONE SEEN     MDM FHT 161 Pain improved in MAU without intervention  Assessment and Plan  A: 1. Abdominal pain affecting pregnancy   2. Fetal heart tones present, second trimester   3. BV (bacterial vaginosis)    P: Discharge home Rx flagyl Schedule prenatal care asap Discussed reasons to return to MAU Recommend maternity support belt  Jorje Guild 06/19/2017, 9:09 AM

## 2017-06-20 LAB — GC/CHLAMYDIA PROBE AMP (~~LOC~~) NOT AT ARMC
CHLAMYDIA, DNA PROBE: NEGATIVE
NEISSERIA GONORRHEA: NEGATIVE

## 2017-07-27 ENCOUNTER — Emergency Department (HOSPITAL_COMMUNITY)
Admission: EM | Admit: 2017-07-27 | Discharge: 2017-07-27 | Disposition: A | Discharge status: Home / Self Care | Payer: Medicaid Other

## 2017-07-27 NOTE — ED Notes (Signed)
After registration patient went to park her car and did not return.

## 2017-07-27 NOTE — ED Notes (Signed)
Patient has not returned

## 2017-08-18 ENCOUNTER — Inpatient Hospital Stay (HOSPITAL_COMMUNITY)
Admission: AD | Admit: 2017-08-18 | Discharge: 2017-08-18 | Disposition: A | Discharge status: Home / Self Care | Payer: Medicaid Other | Source: Ambulatory Visit | Attending: Obstetrics and Gynecology | Admitting: Obstetrics and Gynecology

## 2017-08-18 ENCOUNTER — Encounter (HOSPITAL_COMMUNITY): Payer: Self-pay

## 2017-08-18 DIAGNOSIS — O24912 Unspecified diabetes mellitus in pregnancy, second trimester: Secondary | ICD-10-CM | POA: Insufficient documentation

## 2017-08-18 DIAGNOSIS — R109 Unspecified abdominal pain: Secondary | ICD-10-CM | POA: Diagnosis not present

## 2017-08-18 DIAGNOSIS — R1012 Left upper quadrant pain: Secondary | ICD-10-CM | POA: Insufficient documentation

## 2017-08-18 DIAGNOSIS — K219 Gastro-esophageal reflux disease without esophagitis: Secondary | ICD-10-CM | POA: Diagnosis not present

## 2017-08-18 DIAGNOSIS — Z87891 Personal history of nicotine dependence: Secondary | ICD-10-CM | POA: Diagnosis not present

## 2017-08-18 DIAGNOSIS — O99342 Other mental disorders complicating pregnancy, second trimester: Secondary | ICD-10-CM | POA: Diagnosis not present

## 2017-08-18 DIAGNOSIS — O26892 Other specified pregnancy related conditions, second trimester: Secondary | ICD-10-CM | POA: Insufficient documentation

## 2017-08-18 DIAGNOSIS — O99612 Diseases of the digestive system complicating pregnancy, second trimester: Secondary | ICD-10-CM | POA: Insufficient documentation

## 2017-08-18 DIAGNOSIS — Z3A23 23 weeks gestation of pregnancy: Secondary | ICD-10-CM | POA: Insufficient documentation

## 2017-08-18 DIAGNOSIS — K589 Irritable bowel syndrome without diarrhea: Secondary | ICD-10-CM | POA: Insufficient documentation

## 2017-08-18 DIAGNOSIS — F419 Anxiety disorder, unspecified: Secondary | ICD-10-CM | POA: Diagnosis not present

## 2017-08-18 LAB — URINALYSIS, ROUTINE W REFLEX MICROSCOPIC
BILIRUBIN URINE: NEGATIVE
GLUCOSE, UA: NEGATIVE mg/dL
HGB URINE DIPSTICK: NEGATIVE
Ketones, ur: NEGATIVE mg/dL
Leukocytes, UA: NEGATIVE
Nitrite: NEGATIVE
Protein, ur: NEGATIVE mg/dL
SPECIFIC GRAVITY, URINE: 1.024 (ref 1.005–1.030)
pH: 5 (ref 5.0–8.0)

## 2017-08-18 MED ORDER — OXYCODONE-ACETAMINOPHEN 5-325 MG PO TABS
2.0000 | ORAL_TABLET | Freq: Once | ORAL | Status: AC
  Administered 2017-08-18: 2 via ORAL
  Filled 2017-08-18: qty 2

## 2017-08-18 NOTE — MAU Note (Signed)
Reports feeling SOB for about 1 week and chest pain for 1 week. L side abdominal pain for 1 week- stabbing pain, constant. No vag bleeding, no LOF.

## 2017-08-18 NOTE — Discharge Instructions (Signed)

## 2017-08-18 NOTE — MAU Provider Note (Signed)
History   G2P2002 @ 23 wks in with c/o worsening abd pain in the upper left quadrant. Pt states she has reg bowel movements daily. Pt is diabetic. Also pt has had prev c/s x 2. Open lap for appendix and peritonitis.  CSN: [id number]  Arrival date & time 08/18/17  1313   None     Chief Complaint  Patient presents with  . Shortness of Breath  . Abdominal Pain  . Chest Pain    HPI  Past Medical History:  Diagnosis Date  . Anemia   . Anxiety   . Diabetes mellitus without complication ([place])   . GERD (gastroesophageal reflux disease)    has resolved  . IBS (irritable bowel syndrome)   . Ovarian cyst   . Peritonitis, acute generalized ([place])   . Sepsis [facility])     Past Surgical History:  Procedure Laterality Date  . APPENDECTOMY     ruptured   . CESAREAN SECTION    . OVARIAN CYST DRAINAGE      History reviewed. No pertinent family history.  Social History  Substance Use Topics  . Smoking status: Former Smoker    Packs/day: 0.50    Types: Cigarettes  . Smokeless tobacco: Never Used  . Alcohol use No     Comment: occassional    OB History    Gravida Para Term Preterm AB Living   3 2 2     2    SAB TAB Ectopic Multiple Live Births           2      Review of Systems  Constitutional: Negative.   HENT: Negative.   Eyes: Negative.   Respiratory: Negative.   Cardiovascular: Negative.   Gastrointestinal: Positive for abdominal pain.  Endocrine: Negative.   Genitourinary: Negative.   Musculoskeletal: Negative.   Skin: Negative.   Allergic/Immunologic: Negative.   Neurological: Negative.   Hematological: Negative.   Psychiatric/Behavioral: Negative.     Allergies  Contrast media [iodinated diagnostic agents] and Nsaids  Home Medications    BP 133/68   Pulse 95   Temp 97.9 F (36.6 C) (Oral)   Resp 20   LMP 03/10/2017   SpO2 99%   Physical Exam  Constitutional: She is oriented to person, place, and time. She appears well-developed and  well-nourished.  HENT:  Head: Normocephalic.  Eyes: Pupils are equal, round, and reactive to light.  Neck: Normal range of motion.  Cardiovascular: Normal rate, regular rhythm, normal heart sounds and intact distal pulses.   Pulmonary/Chest: Effort normal and breath sounds normal.  Abdominal: Soft. Bowel sounds are normal.  Genitourinary: Vagina normal and uterus normal.  Musculoskeletal: Normal range of motion.  Neurological: She is alert and oriented to person, place, and time. She has normal reflexes.  Skin: Skin is warm and dry.  Psychiatric: She has a normal mood and affect. Her behavior is normal. Judgment and thought content normal.    MAU Course  Procedures (including critical care time)  Labs Reviewed  URINALYSIS, ROUTINE W REFLEX MICROSCOPIC   No results found.   1. Abdominal pain in pregnancy, second trimester       MDM  VSS, SVE cl/fim/th/post/high. Much abd scarring noted with exam. Lengthy discussion with pt regarding findings and that probable cause of her pain is scar tissue. Encouraged pt to start [facility] since she was high risk and message sent to adm pool to get pt in for care. Will manage pain for today but pt will need to schedule  appt for further pain management. Will d/c home

## 2017-08-20 ENCOUNTER — Encounter (HOSPITAL_COMMUNITY): Payer: Self-pay

## 2017-08-23 ENCOUNTER — Ambulatory Visit (INDEPENDENT_AMBULATORY_CARE_PROVIDER_SITE_OTHER): Payer: Medicaid Other | Admitting: Obstetrics and Gynecology

## 2017-08-23 ENCOUNTER — Encounter: Payer: Self-pay | Admitting: Obstetrics and Gynecology

## 2017-08-23 ENCOUNTER — Other Ambulatory Visit (HOSPITAL_COMMUNITY)
Admission: RE | Admit: 2017-08-23 | Discharge: 2017-08-23 | Disposition: A | Discharge status: Home / Self Care | Payer: Medicaid Other | Source: Ambulatory Visit | Attending: Obstetrics and Gynecology | Admitting: Obstetrics and Gynecology

## 2017-08-23 VITALS — BP 112/73 | HR 92 | Wt 236.8 lb

## 2017-08-23 DIAGNOSIS — Z3A00 Weeks of gestation of pregnancy not specified: Secondary | ICD-10-CM | POA: Insufficient documentation

## 2017-08-23 DIAGNOSIS — Z3480 Encounter for supervision of other normal pregnancy, unspecified trimester: Secondary | ICD-10-CM | POA: Diagnosis not present

## 2017-08-23 DIAGNOSIS — G894 Chronic pain syndrome: Secondary | ICD-10-CM | POA: Diagnosis not present

## 2017-08-23 DIAGNOSIS — O0932 Supervision of pregnancy with insufficient antenatal care, second trimester: Secondary | ICD-10-CM | POA: Diagnosis not present

## 2017-08-23 DIAGNOSIS — Z331 Pregnant state, incidental: Secondary | ICD-10-CM

## 2017-08-23 DIAGNOSIS — Z124 Encounter for screening for malignant neoplasm of cervix: Secondary | ICD-10-CM | POA: Diagnosis not present

## 2017-08-23 DIAGNOSIS — Z113 Encounter for screening for infections with a predominantly sexual mode of transmission: Secondary | ICD-10-CM | POA: Diagnosis not present

## 2017-08-23 DIAGNOSIS — O0930 Supervision of pregnancy with insufficient antenatal care, unspecified trimester: Secondary | ICD-10-CM | POA: Insufficient documentation

## 2017-08-23 DIAGNOSIS — O34219 Maternal care for unspecified type scar from previous cesarean delivery: Secondary | ICD-10-CM | POA: Diagnosis not present

## 2017-08-23 DIAGNOSIS — O24112 Pre-existing diabetes mellitus, type 2, in pregnancy, second trimester: Secondary | ICD-10-CM | POA: Diagnosis not present

## 2017-08-23 DIAGNOSIS — O24119 Pre-existing diabetes mellitus, type 2, in pregnancy, unspecified trimester: Secondary | ICD-10-CM

## 2017-08-23 DIAGNOSIS — O0993 Supervision of high risk pregnancy, unspecified, third trimester: Secondary | ICD-10-CM | POA: Insufficient documentation

## 2017-08-23 DIAGNOSIS — E119 Type 2 diabetes mellitus without complications: Secondary | ICD-10-CM | POA: Insufficient documentation

## 2017-08-23 LAB — POCT URINALYSIS DIP (DEVICE)
BILIRUBIN URINE: NEGATIVE
GLUCOSE, UA: NEGATIVE mg/dL
Hgb urine dipstick: NEGATIVE
KETONES UR: NEGATIVE mg/dL
Leukocytes, UA: NEGATIVE
NITRITE: NEGATIVE
PROTEIN: NEGATIVE mg/dL
Specific Gravity, Urine: 1.025 (ref 1.005–1.030)
Urobilinogen, UA: 1 mg/dL (ref 0.0–1.0)
pH: 6.5 (ref 5.0–8.0)

## 2017-08-23 LAB — GLUCOSE, CAPILLARY: GLUCOSE-CAPILLARY: 156 mg/dL — AB (ref 65–99)

## 2017-08-23 MED ORDER — ASPIRIN EC 81 MG PO TBEC: 81.0000 mg | DELAYED_RELEASE_TABLET | Freq: Every day | ORAL | 2 refills | Status: DC

## 2017-08-23 MED ORDER — METFORMIN HCL 1000 MG PO TABS: 1000.0000 mg | ORAL_TABLET | Freq: Two times a day (BID) | ORAL | 3 refills | Status: DC

## 2017-08-23 MED ORDER — VITAFOL ULTRA 29-0.6-0.4-200 MG PO CAPS: 1.0000 | ORAL_CAPSULE | Freq: Every day | ORAL | 12 refills | Status: DC

## 2017-08-23 MED ORDER — ACCU-CHEK NANO SMARTVIEW W/DEVICE KIT: 1.0000 | PACK | 0 refills | Status: DC

## 2017-08-23 MED ORDER — GLUCOSE BLOOD VI STRP: ORAL_STRIP | 12 refills | Status: DC

## 2017-08-23 NOTE — Patient Instructions (Addendum)
 Second Trimester of Pregnancy The second trimester is from week 14 through week 27 (months 4 through 6). The second trimester is often a time when you feel your best. Your body has adjusted to being pregnant, and you begin to feel better physically. Usually, morning sickness has lessened or quit completely, you may have more energy, and you may have an increase in appetite. The second trimester is also a time when the fetus is growing rapidly. At the end of the sixth month, the fetus is about 9 inches long and weighs about 1 pounds. You will likely begin to feel the baby move (quickening) between 16 and 20 weeks of pregnancy. Body changes during your second trimester Your body continues to go through many changes during your second trimester. The changes vary from woman to woman.  Your weight will continue to increase. You will notice your lower abdomen bulging out.  You may begin to get stretch marks on your hips, abdomen, and breasts.  You may develop headaches that can be relieved by medicines. The medicines should be approved by your health care provider.  You may urinate more often because the fetus is pressing on your bladder.  You may develop or continue to have heartburn as a result of your pregnancy.  You may develop constipation because certain hormones are causing the muscles that push waste through your intestines to slow down.  You may develop hemorrhoids or swollen, bulging veins (varicose veins).  You may have back pain. This is caused by: ? Weight gain. ? Pregnancy hormones that are relaxing the joints in your pelvis. ? A shift in weight and the muscles that support your balance.  Your breasts will continue to grow and they will continue to become tender.  Your gums may bleed and may be sensitive to brushing and flossing.  Dark spots or blotches (chloasma, mask of pregnancy) may develop on your face. This will likely fade after the baby is born.  A dark line from  your belly button to the pubic area (linea nigra) may appear. This will likely fade after the baby is born.  You may have changes in your hair. These can include thickening of your hair, rapid growth, and changes in texture. Some women also have hair loss during or after pregnancy, or hair that feels dry or thin. Your hair will most likely return to normal after your baby is born.  What to expect at prenatal visits During a routine prenatal visit:  You will be weighed to make sure you and the fetus are growing normally.  Your blood pressure will be taken.  Your abdomen will be measured to track your baby's growth.  The fetal heartbeat will be listened to.  Any test results from the previous visit will be discussed.  Your health care provider may ask you:  How you are feeling.  If you are feeling the baby move.  If you have had any abnormal symptoms, such as leaking fluid, bleeding, severe headaches, or abdominal cramping.  If you are using any tobacco products, including cigarettes, chewing tobacco, and electronic cigarettes.  If you have any questions.  Other tests that may be performed during your second trimester include:  Blood tests that check for: ? Low iron levels (anemia). ? High blood sugar that affects pregnant women (gestational diabetes) between 24 and 28 weeks. ? Rh antibodies. This is to check for a protein on red blood cells (Rh factor).  Urine tests to check for infections, diabetes,   or protein in the urine.  An ultrasound to confirm the proper growth and development of the baby.  An amniocentesis to check for possible genetic problems.  Fetal screens for spina bifida and Down syndrome.  HIV (human immunodeficiency virus) testing. Routine prenatal testing includes screening for HIV, unless you choose not to have this test.  Follow these instructions at home: Medicines  Follow your health care provider's instructions regarding medicine use. Specific  medicines may be either safe or unsafe to take during pregnancy.  Take a prenatal vitamin that contains at least 600 micrograms (mcg) of folic acid.  If you develop constipation, try taking a stool softener if your health care provider approves. Eating and drinking  Eat a balanced diet that includes fresh fruits and vegetables, whole grains, good sources of protein such as meat, eggs, or tofu, and low-fat dairy. Your health care provider will help you determine the amount of weight gain that is right for you.  Avoid raw meat and uncooked cheese. These carry germs that can cause birth defects in the baby.  If you have low calcium intake from food, talk to your health care provider about whether you should take a daily calcium supplement.  Limit foods that are high in fat and processed sugars, such as fried and sweet foods.  To prevent constipation: ? Drink enough fluid to keep your urine clear or pale yellow. ? Eat foods that are high in fiber, such as fresh fruits and vegetables, whole grains, and beans. Activity  Exercise only as directed by your health care provider. Most women can continue their usual exercise routine during pregnancy. Try to exercise for 30 minutes at least 5 days a week. Stop exercising if you experience uterine contractions.  Avoid heavy lifting, wear low heel shoes, and practice good posture.  A sexual relationship may be continued unless your health care provider directs you otherwise. Relieving pain and discomfort  Wear a good support bra to prevent discomfort from breast tenderness.  Take warm sitz baths to soothe any pain or discomfort caused by hemorrhoids. Use hemorrhoid cream if your health care provider approves.  Rest with your legs elevated if you have leg cramps or low back pain.  If you develop varicose veins, wear support hose. Elevate your feet for 15 minutes, 3-4 times a day. Limit salt in your diet. Prenatal Care  Write down your questions.  Take them to your prenatal visits.  Keep all your prenatal visits as told by your health care provider. This is important. Safety  Wear your seat belt at all times when driving.  Make a list of emergency phone numbers, including numbers for family, friends, the hospital, and police and fire departments. General instructions  Ask your health care provider for a referral to a local prenatal education class. Begin classes no later than the beginning of month 6 of your pregnancy.  Ask for help if you have counseling or nutritional needs during pregnancy. Your health care provider can offer advice or refer you to specialists for help with various needs.  Do not use hot tubs, steam rooms, or saunas.  Do not douche or use tampons or scented sanitary pads.  Do not cross your legs for long periods of time.  Avoid cat litter boxes and soil used by cats. These carry germs that can cause birth defects in the baby and possibly loss of the fetus by miscarriage or stillbirth.  Avoid all smoking, herbs, alcohol, and unprescribed drugs. Chemicals in these products   can affect the formation and growth of the baby.  Do not use any products that contain nicotine or tobacco, such as cigarettes and e-cigarettes. If you need help quitting, ask your health care provider.  Visit your dentist if you have not gone yet during your pregnancy. Use a soft toothbrush to brush your teeth and be gentle when you floss. Contact a health care provider if:  You have dizziness.  You have mild pelvic cramps, pelvic pressure, or nagging pain in the abdominal area.  You have persistent nausea, vomiting, or diarrhea.  You have a bad smelling vaginal discharge.  You have pain when you urinate. Get help right away if:  You have a fever.  You are leaking fluid from your vagina.  You have spotting or bleeding from your vagina.  You have severe abdominal cramping or pain.  You have rapid weight gain or weight  loss.  You have shortness of breath with chest pain.  You notice sudden or extreme swelling of your face, hands, ankles, feet, or legs.  You have not felt your baby move in over an hour.  You have severe headaches that do not go away when you take medicine.  You have vision changes. Summary  The second trimester is from week 14 through week 27 (months 4 through 6). It is also a time when the fetus is growing rapidly.  Your body goes through many changes during pregnancy. The changes vary from woman to woman.  Avoid all smoking, herbs, alcohol, and unprescribed drugs. These chemicals affect the formation and growth your baby.  Do not use any tobacco products, such as cigarettes, chewing tobacco, and e-cigarettes. If you need help quitting, ask your health care provider.  Contact your health care provider if you have any questions. Keep all prenatal visits as told by your health care provider. This is important. This information is not intended to replace advice given to you by your health care provider. Make sure you discuss any questions you have with your health care provider. Document Released: 10/03/2001 Document Revised: 03/16/2016 Document Reviewed: 12/10/2012 Elsevier Interactive Patient Education  2017 [employer].  Contraception Choices Contraception (birth control) is the use of any methods or devices to prevent pregnancy. Below are some methods to help avoid pregnancy. Hormonal methods  Contraceptive implant. This is a thin, plastic tube containing progesterone hormone. It does not contain estrogen hormone. Your health care provider inserts the tube in the inner part of the upper arm. The tube can remain in place for up to 3 years. After 3 years, the implant must be removed. The implant prevents the ovaries from releasing an egg (ovulation), thickens the cervical mucus to prevent sperm from entering the uterus, and thins the lining of the inside of the  uterus.  Progesterone-only injections. These injections are given every 3 months by your health care provider to prevent pregnancy. This synthetic progesterone hormone stops the ovaries from releasing eggs. It also thickens cervical mucus and changes the uterine lining. This makes it harder for sperm to survive in the uterus.  Birth control pills. These pills contain estrogen and progesterone hormone. They work by preventing the ovaries from releasing eggs (ovulation). They also cause the cervical mucus to thicken, preventing the sperm from entering the uterus. Birth control pills are prescribed by a health care provider.Birth control pills can also be used to treat heavy periods.  Minipill. This type of birth control pill contains only the progesterone hormone. They are taken every day  of each month and must be prescribed by your health care provider.  Birth control patch. The patch contains hormones similar to those in birth control pills. It must be changed once a week and is prescribed by a health care provider.  Vaginal ring. The ring contains hormones similar to those in birth control pills. It is left in the vagina for 3 weeks, removed for 1 week, and then a new one is put back in place. The patient must be comfortable inserting and removing the ring from the vagina.A health care provider's prescription is necessary.  Emergency contraception. Emergency contraceptives prevent pregnancy after unprotected sexual intercourse. This pill can be taken right after sex or up to 5 days after unprotected sex. It is most effective the sooner you take the pills after having sexual intercourse. Most emergency contraceptive pills are available without a prescription. Check with your pharmacist. Do not use emergency contraception as your only form of birth control. Barrier methods  Female condom. This is a thin sheath (latex or rubber) that is worn over the penis during sexual intercourse. It can be used with  spermicide to increase effectiveness.  Female condom. This is a soft, loose-fitting sheath that is put into the vagina before sexual intercourse.  Diaphragm. This is a soft, latex, dome-shaped barrier that must be fitted by a health care provider. It is inserted into the vagina, along with a spermicidal jelly. It is inserted before intercourse. The diaphragm should be left in the vagina for 6 to 8 hours after intercourse.  Cervical cap. This is a round, soft, latex or plastic cup that fits over the cervix and must be fitted by a health care provider. The cap can be left in place for up to 48 hours after intercourse.  Sponge. This is a soft, circular piece of polyurethane foam. The sponge has spermicide in it. It is inserted into the vagina after wetting it and before sexual intercourse.  Spermicides. These are chemicals that kill or block sperm from entering the cervix and uterus. They come in the form of creams, jellies, suppositories, foam, or tablets. They do not require a prescription. They are inserted into the vagina with an applicator before having sexual intercourse. The process must be repeated every time you have sexual intercourse. Intrauterine contraception  Intrauterine device (IUD). This is a T-shaped device that is put in a woman's uterus during a menstrual period to prevent pregnancy. There are 2 types: ? Copper IUD. This type of IUD is wrapped in copper wire and is placed inside the uterus. Copper makes the uterus and fallopian tubes produce a fluid that kills sperm. It can stay in place for 10 years. ? Hormone IUD. This type of IUD contains the hormone progestin (synthetic progesterone). The hormone thickens the cervical mucus and prevents sperm from entering the uterus, and it also thins the uterine lining to prevent implantation of a fertilized egg. The hormone can weaken or kill the sperm that get into the uterus. It can stay in place for 3-5 years, depending on which type of IUD  is used. Permanent methods of contraception  Female tubal ligation. This is when the woman's fallopian tubes are surgically sealed, tied, or blocked to prevent the egg from traveling to the uterus.  Hysteroscopic sterilization. This involves placing a small coil or insert into each fallopian tube. Your doctor uses a technique called hysteroscopy to do the procedure. The device causes scar tissue to form. This results in permanent blockage of the fallopian  tubes, so the sperm cannot fertilize the egg. It takes about 3 months after the procedure for the tubes to become blocked. You must use another form of birth control for these 3 months.  Female sterilization. This is when the female has the tubes that carry sperm tied off (vasectomy).This blocks sperm from entering the vagina during sexual intercourse. After the procedure, the man can still ejaculate fluid (semen). Natural planning methods  Natural family planning. This is not having sexual intercourse or using a barrier method (condom, diaphragm, cervical cap) on days the woman could become pregnant.  Calendar method. This is keeping track of the length of each menstrual cycle and identifying when you are fertile.  Ovulation method. This is avoiding sexual intercourse during ovulation.  Symptothermal method. This is avoiding sexual intercourse during ovulation, using a thermometer and ovulation symptoms.  Post-ovulation method. This is timing sexual intercourse after you have ovulated. Regardless of which type or method of contraception you choose, it is important that you use condoms to protect against the transmission of sexually transmitted infections (STIs). Talk with your health care provider about which form of contraception is most appropriate for you. This information is not intended to replace advice given to you by your health care provider. Make sure you discuss any questions you have with your health care provider. Document Released:  10/09/2005 Document Revised: 03/16/2016 Document Reviewed: 04/03/2013 Elsevier Interactive Patient Education  2017 [employer].

## 2017-08-23 NOTE — Progress Notes (Signed)
Subjective:    Jodi Mcgee is a [id number] [redacted]w[redacted]d being seen today for her first obstetrical visit.  Her obstetrical history is significant for type 2 diabetes, 2 previous cesarean section, late onset to care at 23 weeks and chronic abdominal pain due to multiple surgeries. Patient does intend to breast feed. Pregnancy history fully reviewed.  Patient reports abdominal pain related to scar tissue, requesting pain medication.  Vitals:   08/23/17 1000  BP: 112/73  Pulse: 92  Weight: 236 lb 12.8 oz (107.4 kg)    HISTORY: OB History  Gravida Para Term Preterm AB Living  3 2 1 1  0 2  SAB TAB Ectopic Multiple Live Births  0 0 0   2    # Outcome Date GA Lbr Len/2nd Weight Sex Delivery Anes PTL Lv  3 Current           2 Term 06/24/08 [redacted]w[redacted]d   F CS-LTranv Spinal N LIV  1 Preterm 04/08/07 [redacted]w[redacted]d   M CS-LTranv Spinal  LIV     Past Medical History:  Diagnosis Date  . Anemia   . Anxiety   . Diabetes mellitus without complication ([place])   . GERD (gastroesophageal reflux disease)    has resolved  . IBS (irritable bowel syndrome)   . Ovarian cyst   . Peritonitis, acute generalized ([place])   . Sepsis [facility])    Past Surgical History:  Procedure Laterality Date  . APPENDECTOMY    . APPENDECTOMY     ruptured   . CESAREAN SECTION    . OVARIAN CYST DRAINAGE    . SMALL BOWEL REPAIR     History reviewed. No pertinent family history.   Exam    Uterus:  Fundal Height: 24 cm  Pelvic Exam:    Perineum: Normal Perineum   Vulva: normal   Vagina:  normal mucosa, normal discharge   pH:    Cervix: multiparous appearance   Adnexa: no mass, fullness, tenderness   Bony Pelvis: gynecoid  System: Breast:  normal appearance, no masses or tenderness   Skin: normal coloration and turgor, no rashes    Neurologic: oriented, no focal deficits   Extremities: normal strength, tone, and muscle mass   HEENT extra ocular movement intact   Mouth/Teeth mucous membranes moist, pharynx normal without  lesions and dental hygiene good   Neck supple and no masses   Cardiovascular: regular rate and rhythm   Respiratory:  chest clear, no wheezing, crepitations, rhonchi, normal symmetric air entry   Abdomen: soft, non-tender; bowel sounds normal; no masses,  no organomegaly   Urinary:       Assessment:    Pregnancy: [id number] Patient Active Problem List   Diagnosis Date Noted  . Previous cesarean section complicating pregnancy 17/61/6073  . Supervision of other normal pregnancy, antepartum 08/23/2017  . Insufficient prenatal care 08/23/2017  . Type 2 diabetes mellitus complicating pregnancy, antepartum 08/23/2017        Plan:     Initial labs drawn. Prenatal vitamins. Problem list reviewed and updated. Genetic Screening discussed : declined.  Ultrasound discussed; fetal survey: ordered. Patient restarted on Metformin. Random CBG 156 Patient scheduled to meet with diabetic educator on 11/15 Advised to start monitoring CBGs and modifying diet. Advised to also incorporate exercise Fetal echo ordered Referral for eye exam placed Patient referred to pain clinic for her chronic pain symptoms. Encouraged her to wear a maternity belt. Informed patient that I would not prescribe narcotics at this point in time Patient is considering  BTL- consent form to be signed at next visit  Follow up in 3 weeks. 50% of 30 min visit spent on counseling and coordination of care.     Aveen Stansel 08/23/2017

## 2017-08-24 LAB — COMPREHENSIVE METABOLIC PANEL
ALBUMIN: 3.5 g/dL (ref 3.5–5.5)
ALT: 9 IU/L (ref 0–32)
AST: 10 IU/L (ref 0–40)
Albumin/Globulin Ratio: 1.1 — ABNORMAL LOW (ref 1.2–2.2)
Alkaline Phosphatase: 64 IU/L (ref 39–117)
BUN / CREAT RATIO: 12 (ref 9–23)
BUN: 7 mg/dL (ref 6–20)
Bilirubin Total: <0.2 mg/dL (ref 0.0–1.2)
CALCIUM: 9 mg/dL (ref 8.7–10.2)
CO2: 19 mmol/L — ABNORMAL LOW (ref 20–29)
Chloride: 102 mmol/L (ref 96–106)
Creatinine, Ser: 0.57 mg/dL (ref 0.57–1.00)
GFR calc Af Amer: 143 mL/min/{1.73_m2} (ref >59)
GFR, EST NON AFRICAN AMERICAN: 124 mL/min/{1.73_m2} (ref >59)
GLOBULIN, TOTAL: 3.3 g/dL (ref 1.5–4.5)
Glucose: 107 mg/dL — ABNORMAL HIGH (ref 65–99)
POTASSIUM: 4 mmol/L (ref 3.5–5.2)
SODIUM: 139 mmol/L (ref 134–144)
Total Protein: 6.8 g/dL (ref 6.0–8.5)

## 2017-08-24 LAB — PROTEIN / CREATININE RATIO, URINE
Creatinine, Urine: 164.7 mg/dL
Protein, Ur: 19.6 mg/dL
Protein/Creat Ratio: 119 mg/g creat (ref 0–200)

## 2017-08-24 LAB — GC/CHLAMYDIA PROBE AMP (~~LOC~~) NOT AT ARMC
CHLAMYDIA, DNA PROBE: NEGATIVE
Neisseria Gonorrhea: NEGATIVE

## 2017-08-25 LAB — CULTURE, OB URINE

## 2017-08-25 LAB — URINE CULTURE, OB REFLEX

## 2017-08-27 ENCOUNTER — Ambulatory Visit (HOSPITAL_COMMUNITY)
Admission: RE | Admit: 2017-08-27 | Discharge: 2017-08-27 | Disposition: A | Discharge status: Home / Self Care | Payer: Medicaid Other | Source: Ambulatory Visit | Attending: Obstetrics and Gynecology | Admitting: Obstetrics and Gynecology

## 2017-08-27 ENCOUNTER — Other Ambulatory Visit (HOSPITAL_COMMUNITY): Payer: Self-pay | Admitting: *Deleted

## 2017-08-27 ENCOUNTER — Other Ambulatory Visit: Payer: Self-pay | Admitting: Obstetrics and Gynecology

## 2017-08-27 ENCOUNTER — Encounter (HOSPITAL_COMMUNITY): Payer: Self-pay

## 2017-08-27 DIAGNOSIS — O0932 Supervision of pregnancy with insufficient antenatal care, second trimester: Secondary | ICD-10-CM | POA: Diagnosis not present

## 2017-08-27 DIAGNOSIS — Z3A24 24 weeks gestation of pregnancy: Secondary | ICD-10-CM | POA: Diagnosis not present

## 2017-08-27 DIAGNOSIS — Z363 Encounter for antenatal screening for malformations: Secondary | ICD-10-CM | POA: Diagnosis not present

## 2017-08-27 DIAGNOSIS — O34219 Maternal care for unspecified type scar from previous cesarean delivery: Secondary | ICD-10-CM | POA: Insufficient documentation

## 2017-08-27 DIAGNOSIS — O24112 Pre-existing diabetes mellitus, type 2, in pregnancy, second trimester: Secondary | ICD-10-CM | POA: Diagnosis not present

## 2017-08-27 DIAGNOSIS — O24415 Gestational diabetes mellitus in pregnancy, controlled by oral hypoglycemic drugs: Secondary | ICD-10-CM | POA: Insufficient documentation

## 2017-08-27 DIAGNOSIS — Z3480 Encounter for supervision of other normal pregnancy, unspecified trimester: Secondary | ICD-10-CM

## 2017-08-27 DIAGNOSIS — O24119 Pre-existing diabetes mellitus, type 2, in pregnancy, unspecified trimester: Secondary | ICD-10-CM

## 2017-08-27 MED ORDER — CEPHALEXIN 500 MG PO CAPS: 500.0000 mg | ORAL_CAPSULE | Freq: Four times a day (QID) | ORAL | 0 refills | Status: DC

## 2017-08-27 NOTE — Addendum Note (Signed)
Encounter addended by: Jeanene Erb on: 08/27/2017 1:56 PM  Actions taken: Imaging Exam ended

## 2017-08-30 ENCOUNTER — Encounter: Payer: Self-pay | Admitting: Obstetrics and Gynecology

## 2017-08-31 ENCOUNTER — Telehealth: Payer: Self-pay | Admitting: General Practice

## 2017-08-31 LAB — OBSTETRIC PANEL, INCLUDING HIV
Antibody Screen: NEGATIVE
BASOS ABS: 0 10*3/uL (ref 0.0–0.2)
Basos: 0 %
EOS (ABSOLUTE): 0.1 10*3/uL (ref 0.0–0.4)
Eos: 1 %
HEMOGLOBIN: 10.1 g/dL — AB (ref 11.1–15.9)
HEP B S AG: NEGATIVE
HIV Screen 4th Generation wRfx: NONREACTIVE
Hematocrit: 31 % — ABNORMAL LOW (ref 34.0–46.6)
IMMATURE GRANS (ABS): 0 10*3/uL (ref 0.0–0.1)
IMMATURE GRANULOCYTES: 0 %
LYMPHS ABS: 0.9 10*3/uL (ref 0.7–3.1)
LYMPHS: 14 %
MCH: 27.6 pg (ref 26.6–33.0)
MCHC: 32.6 g/dL (ref 31.5–35.7)
MCV: 85 fL (ref 79–97)
MONOCYTES: 8 %
Monocytes Absolute: 0.5 10*3/uL (ref 0.1–0.9)
NEUTROS PCT: 77 %
Neutrophils Absolute: 4.9 10*3/uL (ref 1.4–7.0)
PLATELETS: 261 10*3/uL (ref 150–379)
RBC: 3.66 x10E6/uL — ABNORMAL LOW (ref 3.77–5.28)
RDW: 15 % (ref 12.3–15.4)
RPR: NONREACTIVE
RUBELLA: 3.39 {index} (ref >0.99)
Rh Factor: POSITIVE
WBC: 6.4 10*3/uL (ref 3.4–10.8)

## 2017-08-31 LAB — SMN1 COPY NUMBER ANALYSIS (SMA CARRIER SCREENING)

## 2017-08-31 LAB — CYSTIC FIBROSIS MUTATION 97: Interpretation: NOT DETECTED

## 2017-08-31 LAB — HEMOGLOBIN A1C
Est. average glucose Bld gHb Est-mCnc: 91 mg/dL
Hgb A1c MFr Bld: 4.8 % (ref 4.8–5.6)

## 2017-08-31 LAB — HEMOGLOBINOPATHY EVALUATION
HEMOGLOBIN A2 QUANTITATION: 2.2 % (ref 1.8–3.2)
HGB C: 0 %
HGB S: 0 %
HGB VARIANT: 0 %
Hemoglobin F Quantitation: 0 % (ref 0.0–2.0)
Hgb A: 97.8 % (ref 96.4–98.8)

## 2017-08-31 LAB — TSH: TSH: 2.3 u[IU]/mL (ref 0.450–4.500)

## 2017-08-31 NOTE — Telephone Encounter (Signed)
Called patient, no answer- left message stating we are calling to let you know your most recent urine culture shows you have a UTI. We have sent a prescription to your [facility] pharmacy please pick this up. You will need to take it QID x1 week. You may call us if you have questions.

## 2017-08-31 NOTE — Telephone Encounter (Signed)
-----   Message from Mora Bellman, MD sent at 08/27/2017  8:06 AM EST ----- Please inform patient of UTI. Rx has been e-prescribed  Holiday representative

## 2017-09-06 ENCOUNTER — Other Ambulatory Visit: Payer: Medicaid Other

## 2017-09-11 ENCOUNTER — Encounter: Payer: Medicaid Other | Admitting: Family Medicine

## 2017-09-11 ENCOUNTER — Encounter: Payer: Self-pay | Admitting: Family Medicine

## 2017-09-11 NOTE — Progress Notes (Signed)
Patient did not keep appointment today. She will be called to reschedule.  

## 2017-09-12 ENCOUNTER — Other Ambulatory Visit: Payer: Self-pay

## 2017-09-12 ENCOUNTER — Encounter (HOSPITAL_COMMUNITY): Payer: Self-pay

## 2017-09-12 ENCOUNTER — Inpatient Hospital Stay (HOSPITAL_COMMUNITY)
Admission: AD | Admit: 2017-09-12 | Discharge: 2017-09-13 | Disposition: A | Discharge status: Home / Self Care | Payer: Worker's Compensation | Source: Ambulatory Visit | Attending: Obstetrics & Gynecology | Admitting: Obstetrics & Gynecology

## 2017-09-12 DIAGNOSIS — N83209 Unspecified ovarian cyst, unspecified side: Secondary | ICD-10-CM | POA: Diagnosis not present

## 2017-09-12 DIAGNOSIS — O99342 Other mental disorders complicating pregnancy, second trimester: Secondary | ICD-10-CM | POA: Diagnosis not present

## 2017-09-12 DIAGNOSIS — O34219 Maternal care for unspecified type scar from previous cesarean delivery: Secondary | ICD-10-CM | POA: Diagnosis not present

## 2017-09-12 DIAGNOSIS — Z91041 Radiographic dye allergy status: Secondary | ICD-10-CM | POA: Diagnosis not present

## 2017-09-12 DIAGNOSIS — Z888 Allergy status to other drugs, medicaments and biological substances status: Secondary | ICD-10-CM | POA: Insufficient documentation

## 2017-09-12 DIAGNOSIS — O24119 Pre-existing diabetes mellitus, type 2, in pregnancy, unspecified trimester: Secondary | ICD-10-CM

## 2017-09-12 DIAGNOSIS — T1490XA Injury, unspecified, initial encounter: Secondary | ICD-10-CM | POA: Insufficient documentation

## 2017-09-12 DIAGNOSIS — Z9889 Other specified postprocedural states: Secondary | ICD-10-CM | POA: Diagnosis not present

## 2017-09-12 DIAGNOSIS — O3482 Maternal care for other abnormalities of pelvic organs, second trimester: Secondary | ICD-10-CM | POA: Insufficient documentation

## 2017-09-12 DIAGNOSIS — F419 Anxiety disorder, unspecified: Secondary | ICD-10-CM | POA: Insufficient documentation

## 2017-09-12 DIAGNOSIS — O99612 Diseases of the digestive system complicating pregnancy, second trimester: Secondary | ICD-10-CM | POA: Insufficient documentation

## 2017-09-12 DIAGNOSIS — Z87891 Personal history of nicotine dependence: Secondary | ICD-10-CM | POA: Insufficient documentation

## 2017-09-12 DIAGNOSIS — O26892 Other specified pregnancy related conditions, second trimester: Secondary | ICD-10-CM | POA: Diagnosis not present

## 2017-09-12 DIAGNOSIS — Z3480 Encounter for supervision of other normal pregnancy, unspecified trimester: Secondary | ICD-10-CM

## 2017-09-12 DIAGNOSIS — K589 Irritable bowel syndrome without diarrhea: Secondary | ICD-10-CM | POA: Insufficient documentation

## 2017-09-12 DIAGNOSIS — O1415 Severe pre-eclampsia, complicating the puerperium: Secondary | ICD-10-CM

## 2017-09-12 DIAGNOSIS — E119 Type 2 diabetes mellitus without complications: Secondary | ICD-10-CM | POA: Diagnosis not present

## 2017-09-12 DIAGNOSIS — K219 Gastro-esophageal reflux disease without esophagitis: Secondary | ICD-10-CM | POA: Insufficient documentation

## 2017-09-12 DIAGNOSIS — Z3009 Encounter for other general counseling and advice on contraception: Secondary | ICD-10-CM

## 2017-09-12 DIAGNOSIS — Z886 Allergy status to analgesic agent status: Secondary | ICD-10-CM | POA: Insufficient documentation

## 2017-09-12 DIAGNOSIS — O24112 Pre-existing diabetes mellitus, type 2, in pregnancy, second trimester: Secondary | ICD-10-CM | POA: Diagnosis not present

## 2017-09-12 DIAGNOSIS — Z3A27 27 weeks gestation of pregnancy: Secondary | ICD-10-CM | POA: Diagnosis not present

## 2017-09-12 MED ORDER — CYCLOBENZAPRINE HCL 10 MG PO TABS
10.0000 mg | ORAL_TABLET | Freq: Once | ORAL | Status: AC
  Administered 2017-09-12: 10 mg via ORAL
  Filled 2017-09-12: qty 1

## 2017-09-12 MED ORDER — BUTALBITAL-APAP-CAFFEINE 50-325-40 MG PO TABS
1.0000 | ORAL_TABLET | Freq: Once | ORAL | Status: AC
  Administered 2017-09-12: 1 via ORAL
  Filled 2017-09-12: qty 1

## 2017-09-12 MED ORDER — BUTALBITAL-APAP-CAFFEINE 50-325-40 MG PO TABS: 2.0000 | ORAL_TABLET | Freq: Once | ORAL | Status: DC

## 2017-09-12 MED ORDER — ACETAMINOPHEN 325 MG PO TABS
650.0000 mg | ORAL_TABLET | Freq: Once | ORAL | Status: AC
  Administered 2017-09-12: 650 mg via ORAL
  Filled 2017-09-12: qty 2

## 2017-09-12 NOTE — MAU Note (Signed)
Patient reports pain in back, stomach and top of head.  No bleeding or LOF. No cramping.
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
## 2017-09-13 DIAGNOSIS — O34219 Maternal care for unspecified type scar from previous cesarean delivery: Secondary | ICD-10-CM

## 2017-09-13 DIAGNOSIS — O24119 Pre-existing diabetes mellitus, type 2, in pregnancy, unspecified trimester: Secondary | ICD-10-CM | POA: Diagnosis not present

## 2017-09-13 DIAGNOSIS — O1415 Severe pre-eclampsia, complicating the puerperium: Secondary | ICD-10-CM

## 2017-09-13 MED ORDER — HYDROCODONE-ACETAMINOPHEN 5-325 MG PO TABS: 1.0000 | ORAL_TABLET | Freq: Four times a day (QID) | ORAL | 0 refills | Status: DC | PRN
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
## 2017-09-13 NOTE — Discharge Instructions (Signed)
Concussion, Adult  A concussion is a brain injury from a direct hit (blow) to the head or body. This injury causes the brain to shake quickly back and forth inside the skull. It is caused by:   A hit to the head.   A quick and sudden movement (jolt) of the head or neck.    How fast you will get better from a concussion depends on many things like how bad your concussion was, what part of your brain was hurt, how old you are, and how healthy you were before the concussion. Recovery can take time. It is important to wait to return to activity until a doctor says it is safe and your symptoms are all gone.  Follow these instructions at home:  Activity   Limit activities that need a lot of thought or concentration. These include:  ? Homework or work for your job.  ? Watching TV.  ? Computer work.  ? Playing memory games and puzzles.   Rest. Rest helps the brain to heal. Make sure you:  ? Get plenty of sleep at night. Do not stay up late.  ? Go to bed at the same time every day.  ? Rest during the day. Take naps or rest breaks when you feel tired.   It can be dangerous if you get another concussion before the first one has healed Do not do activities that could cause a second concussion, such as riding a bike or playing sports.   Ask your doctor when you can return to your normal activities, like driving, riding a bike, or using machinery. Your ability to react may be slower. Do not do these activities if you are dizzy. Your doctor will likely give you a plan for slowly going back to activities.  General instructions   Take over-the-counter and prescription medicines only as told by your doctor.   Do not drink alcohol until your doctor says you can.   If it is harder than usual to remember things, write them down.   If you are easily distracted, try to do one thing at a time. For example, do not try to watch TV while making dinner.   Talk with family members or close friends when you need to make important  decisions.   Watch your symptoms and tell other people to do the same. Other problems (complications) can happen after a concussion. Older adults with a brain injury may have a higher risk of serious problems, such as a blood clot in the brain.   Tell your teachers, school nurse, school counselor, coach, athletic trainer, or work manager about your injury and symptoms. Tell them about what you can or cannot do. They should watch for:  ? More problems with attention or concentration.  ? More trouble remembering or learning new information.  ? More time needed to do tasks or assignments.  ? Being more annoyed (irritable) or having a harder time dealing with stress.  ? Any other symptoms that get worse.   Keep all follow-up visits as told by your health care provider. This is important.  Prevention   It is very important that you donot get another brain injury, especially before you have healed. In rare cases, another injury can cause permanent brain damage, brain swelling, or death. You have the most risk if you get another head injury in the first 7-10 days after you were hurt before. To avoid injuries:  ? Wear a seat belt when you ride in   a car.  ? Do not drink too much alcohol.  ? Avoid activities that could make you get a second concussion, like contact sports.  ? Wear a helmet when you do activities like:   Biking.   Skiing.   Skateboarding.   Skating.  ? Make your home safe by:   Removing things from the floor or stairs that could make you trip.   Using grab bars in bathrooms and handrails by stairs.   Placing non-slip mats on floors and in bathtubs.   Putting more light in dark areas.  Contact a doctor if:   Your symptoms get worse.   You have new symptoms.   You keep having symptoms for more than 2 weeks.  Get help right away if:   You have bad headaches, or your headaches get worse.   You have weakness in any part of your body.   You have loss of feeling (numbness).   You feel off  balance.   You keep throwing up (vomiting).   You feel more sleepy.   The black center of one eye (pupil) is bigger than the other one.   You twitch or shake violently (convulse) or have a seizure.   Your speech is not clear (is slurred).   You feel more tired, more confused, or more annoyed.   You do not recognize people or places.   You have neck pain.   It is hard to wake you up.   You have strange behavior changes.   You pass out (lose consciousness).  Summary   A concussion is a brain injury from a direct hit (blow) to the head or body.   This condition is treated with rest and careful watching of symptoms.   If you keep having symptoms for more than 2 weeks, call your doctor.  This information is not intended to replace advice given to you by your health care provider. Make sure you discuss any questions you have with your health care provider.  Document Released: 09/27/2009 Document Revised: 09/23/2016 Document Reviewed: 09/23/2016  Elsevier Interactive Patient Education  2017 [employer]

## 2017-09-20 ENCOUNTER — Emergency Department (HOSPITAL_BASED_OUTPATIENT_CLINIC_OR_DEPARTMENT_OTHER)
Admission: EM | Admit: 2017-09-20 | Discharge: 2017-09-20 | Disposition: A | Discharge status: Home / Self Care | Payer: Worker's Compensation | Attending: Emergency Medicine | Admitting: Emergency Medicine

## 2017-09-20 ENCOUNTER — Encounter (HOSPITAL_BASED_OUTPATIENT_CLINIC_OR_DEPARTMENT_OTHER): Payer: Self-pay | Admitting: *Deleted

## 2017-09-20 ENCOUNTER — Other Ambulatory Visit: Payer: Self-pay

## 2017-09-20 DIAGNOSIS — Z7984 Long term (current) use of oral hypoglycemic drugs: Secondary | ICD-10-CM | POA: Insufficient documentation

## 2017-09-20 DIAGNOSIS — Z7982 Long term (current) use of aspirin: Secondary | ICD-10-CM | POA: Diagnosis not present

## 2017-09-20 DIAGNOSIS — O26899 Other specified pregnancy related conditions, unspecified trimester: Secondary | ICD-10-CM | POA: Insufficient documentation

## 2017-09-20 DIAGNOSIS — E119 Type 2 diabetes mellitus without complications: Secondary | ICD-10-CM | POA: Insufficient documentation

## 2017-09-20 DIAGNOSIS — M5431 Sciatica, right side: Secondary | ICD-10-CM | POA: Insufficient documentation

## 2017-09-20 DIAGNOSIS — Z79899 Other long term (current) drug therapy: Secondary | ICD-10-CM | POA: Insufficient documentation

## 2017-09-20 DIAGNOSIS — Z87891 Personal history of nicotine dependence: Secondary | ICD-10-CM | POA: Insufficient documentation
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
## 2017-09-24 ENCOUNTER — Other Ambulatory Visit (HOSPITAL_COMMUNITY): Payer: Self-pay | Admitting: *Deleted

## 2017-09-24 ENCOUNTER — Encounter (HOSPITAL_COMMUNITY): Payer: Self-pay

## 2017-09-24 ENCOUNTER — Ambulatory Visit (HOSPITAL_COMMUNITY)
Admission: RE | Admit: 2017-09-24 | Discharge: 2017-09-24 | Disposition: A | Discharge status: Home / Self Care | Payer: Medicaid Other | Source: Ambulatory Visit | Attending: Family Medicine | Admitting: Family Medicine

## 2017-09-24 DIAGNOSIS — Z3A28 28 weeks gestation of pregnancy: Secondary | ICD-10-CM | POA: Diagnosis not present

## 2017-09-24 DIAGNOSIS — O24119 Pre-existing diabetes mellitus, type 2, in pregnancy, unspecified trimester: Secondary | ICD-10-CM

## 2017-09-24 DIAGNOSIS — O24415 Gestational diabetes mellitus in pregnancy, controlled by oral hypoglycemic drugs: Secondary | ICD-10-CM | POA: Insufficient documentation

## 2017-09-24 DIAGNOSIS — O24319 Unspecified pre-existing diabetes mellitus in pregnancy, unspecified trimester: Secondary | ICD-10-CM

## 2017-09-24 DIAGNOSIS — O34219 Maternal care for unspecified type scar from previous cesarean delivery: Secondary | ICD-10-CM

## 2017-09-24 DIAGNOSIS — Z3480 Encounter for supervision of other normal pregnancy, unspecified trimester: Secondary | ICD-10-CM

## 2017-09-24 DIAGNOSIS — Z7984 Long term (current) use of oral hypoglycemic drugs: Principal | ICD-10-CM

## 2017-09-24 DIAGNOSIS — Z3009 Encounter for other general counseling and advice on contraception: Secondary | ICD-10-CM

## 2017-09-25 ENCOUNTER — Ambulatory Visit (INDEPENDENT_AMBULATORY_CARE_PROVIDER_SITE_OTHER): Payer: Medicaid Other | Admitting: Nurse Practitioner

## 2017-09-25 ENCOUNTER — Ambulatory Visit: Payer: Medicaid Other | Admitting: *Deleted

## 2017-09-25 ENCOUNTER — Encounter: Payer: Medicaid Other | Attending: Nurse Practitioner | Admitting: *Deleted

## 2017-09-25 VITALS — BP 118/71 | HR 102 | Wt 245.3 lb

## 2017-09-25 DIAGNOSIS — E669 Obesity, unspecified: Secondary | ICD-10-CM

## 2017-09-25 DIAGNOSIS — O24112 Pre-existing diabetes mellitus, type 2, in pregnancy, second trimester: Secondary | ICD-10-CM

## 2017-09-25 DIAGNOSIS — Z23 Encounter for immunization: Secondary | ICD-10-CM

## 2017-09-25 DIAGNOSIS — O24113 Pre-existing diabetes mellitus, type 2, in pregnancy, third trimester: Secondary | ICD-10-CM

## 2017-09-25 DIAGNOSIS — O0993 Supervision of high risk pregnancy, unspecified, third trimester: Secondary | ICD-10-CM

## 2017-09-25 DIAGNOSIS — Z3480 Encounter for supervision of other normal pregnancy, unspecified trimester: Secondary | ICD-10-CM

## 2017-09-25 DIAGNOSIS — O34219 Maternal care for unspecified type scar from previous cesarean delivery: Secondary | ICD-10-CM

## 2017-09-25 DIAGNOSIS — O403XX0 Polyhydramnios, third trimester, not applicable or unspecified: Secondary | ICD-10-CM | POA: Insufficient documentation

## 2017-09-25 DIAGNOSIS — O24119 Pre-existing diabetes mellitus, type 2, in pregnancy, unspecified trimester: Secondary | ICD-10-CM

## 2017-09-25 NOTE — Progress Notes (Signed)
  Patient was seen on 12/4*2018 for type 2 Diabetes with pregnancy self-management . Patient states history of GDM with last pregnancies, daughter is now 32 years old. She has a meter and states she is testing twice a day before breakfast and supper meals. She is aware of the need to test 4 times a day during this pregnancy. Diet history obtained, she appears to eat good variety of all food groups but portion sizes are larger that typically recommended. She states she does not snack. She works 2 jobs and sleeps from 11 PM to 6 AM or longer if she is off work that day. She states she is taking Metformin @ 1000 mg twice daily. The following learning objectives were met by the patient :   States the definition of type 2 Diabetes with pregnancy   States why dietary management is important in controlling blood glucose  Describes the effects of carbohydrates on blood glucose levels  Demonstrates ability to create a balanced meal plan  Demonstrates carbohydrate counting   States when to check blood glucose levels  Demonstrates proper blood glucose monitoring techniques  States the effect of stress and exercise on blood glucose levels  States the importance of limiting caffeine and abstaining from alcohol and smoking  Plan:  Aim for 4 Carb Choices per meal (45 grams) +/- 1 either way  Aim for 1-2 Carbs per snack Begin reading food labels for Total Carbohydrate of foods Consider  increasing your activity level by walking or other activity daily as tolerated Begin checking BG before breakfast and 2 hours after first bite of breakfast, lunch and dinner as directed by MD  Bring Log Book to every medical appointment   Take medication if directed by MD  Patient already has a meter:  And is testing pre breakfast and pre-supper Review of Log Book shows: patient reports pre meal BG in 140-160 mg/dl range  Patient instructed to monitor glucose levels: FBS: 60 - 95 mg/dl 2 hour: <120 mg/dl  Patient  received the following handouts:  Nutrition Diabetes and Pregnancy  Carbohydrate Counting List  Patient will be seen for follow-up as needed.

## 2017-09-25 NOTE — Patient Instructions (Addendum)
Third Trimester of Pregnancy The third trimester is from week 29 through week 42, months 7 through 9. This trimester is when your unborn baby (fetus) is growing very fast. At the end of the ninth month, the unborn baby is about 20 inches in length. It weighs about 6-10 pounds. Follow these instructions at home:  Avoid all smoking, herbs, and alcohol. Avoid drugs not approved by your doctor.  Do not use any tobacco products, including cigarettes, chewing tobacco, and electronic cigarettes. If you need help quitting, ask your doctor. You may get counseling or other support to help you quit.  Only take medicine as told by your doctor. Some medicines are safe and some are not during pregnancy.  Exercise only as told by your doctor. Stop exercising if you start having cramps.  Eat regular, healthy meals.  Wear a good support bra if your breasts are tender.  Do not use hot tubs, steam rooms, or saunas.  Wear your seat belt when driving.  Avoid raw meat, uncooked cheese, and liter boxes and soil used by cats.  Take your prenatal vitamins.  Take 1500-2000 milligrams of calcium daily starting at the 20th week of pregnancy until you deliver your baby.  Try taking medicine that helps you poop (stool softener) as needed, and if your doctor approves. Eat more fiber by eating fresh fruit, vegetables, and whole grains. Drink enough fluids to keep your pee (urine) clear or pale yellow.  Take warm water baths (sitz baths) to soothe pain or discomfort caused by hemorrhoids. Use hemorrhoid cream if your doctor approves.  If you have puffy, bulging veins (varicose veins), wear support hose. Raise (elevate) your feet for 15 minutes, 3-4 times a day. Limit salt in your diet.  Avoid heavy lifting, wear low heels, and sit up straight.  Rest with your legs raised if you have leg cramps or low back pain.  Visit your dentist if you have not gone during your pregnancy. Use a soft toothbrush to brush your  teeth. Be gentle when you floss.  You can have sex (intercourse) unless your doctor tells you not to.  Do not travel far distances unless you must. Only do so with your doctor's approval.  Take prenatal classes.  Practice driving to the hospital.  Pack your hospital bag.  Prepare the baby's room.  Go to your doctor visits. Get help if:  You are not sure if you are in labor or if your water has broken.  You are dizzy.  You have mild cramps or pressure in your lower belly (abdominal).  You have a nagging pain in your belly area.  You continue to feel sick to your stomach (nauseous), throw up (vomit), or have watery poop (diarrhea).  You have bad smelling fluid coming from your vagina.  You have pain with peeing (urination). Get help right away if:  You have a fever.  You are leaking fluid from your vagina.  You are spotting or bleeding from your vagina.  You have severe belly cramping or pain.  You lose or gain weight rapidly.  You have trouble catching your breath and have chest pain.  You notice sudden or extreme puffiness (swelling) of your face, hands, ankles, feet, or legs.  You have not felt the baby move in over an hour.  You have severe headaches that do not go away with medicine.  You have vision changes. This information is not intended to replace advice given to you by your health care provider. Make   sure you discuss any questions you have with your health care provider. Document Released: 01/03/2010 Document Revised: 03/16/2016 Document Reviewed: 12/10/2012 Elsevier Interactive Patient Education  2017 [place].  Type 1 or Type 2 Diabetes Mellitus During Pregnancy, Self Care When you have type 1 or type 2 diabetes (diabetes mellitus), you must keep your blood sugar (glucose) under control. You can do this with:  Nutrition.  Exercise.  Lifestyle changes.  Insulin or medicines, if needed.  Support from your doctors and others.  How do I  manage my blood sugar?  Check your blood sugar every day, as often as told.  Call your doctor if your blood sugar is above your goal numbers for 2 tests in a row.  Have your A1c (hemoglobin A1c) level checked at least two times a year. Have it checked more often if your doctor tells you to do that. Your doctor will set treatment goals for you. In general, you should have these blood sugar levels:  After not eating for a long time (fasting): 95 mg/dL (5.3 mmol/L).  After meals (postprandial): ? One hour after a meal: at or below 140 mg/dL (7.8 mmol/L). ? Two hours after a meal: at or below 120 mg/dL (6.7 mmol/L).  A1c level: 6-6.5%.  What do I need to know about high blood sugar? High blood sugar is called hyperglycemia. Know the signs of high blood sugar. Signs may include:  Feeling: ? Thirsty. ? Hungry. ? Very tired.  Needing to pee (urinate) more than usual.  Blurry vision.  What do I need to know about low blood sugar? Low blood sugar is called hypoglycemia. This is when blood sugar is at or below 70 mg/dL (3.9 mmol/L). Symptoms may include:  Feeling: ? Hungry. ? Worried or nervous (anxious). ? Sweaty and clammy. ? Confused. ? Dizzy. ? Sleepy. ? Sick to your stomach (nauseous).  Having: ? A fast heartbeat. ? A headache. ? A change in your vision. ? Jerky movements that you cannot control (seizure). ? Nightmares. ? Tingling or no feeling (numbness) around the mouth, lips, or tongue.  Having trouble with: ? Talking. ? Paying attention (concentrating). ? Moving (coordination). ? Sleeping.  Shaking.  Passing out (fainting).  Getting upset easily (irritability).  Treating low blood sugar  To treat low blood sugar, eat or drink something sugary right away. If you can think clearly and swallow safely, follow the 15:15 rule:  Take 15 grams of a fast-acting carb (carbohydrate). Some fast-acting carbs are: ? 1 tube of glucose gel. ? 3 sugar tablets (glucose  pills). ? 6-8 pieces of hard candy. ? 4 oz (120 mL) of fruit juice. ? 4 oz (120 mL) regular (not diet) soda.  Check your blood sugar 15 minutes after you take the carb.  If your blood sugar is still at or below 70 mg/dL (3.9 mmol/L), take 15 grams of a carb again.  If your blood sugar does not go above 70 mg/dL (3.9 mmol/L) after 3 tries, get help right away.  After your blood sugar goes back to normal, eat a meal or a snack within 1 hour.  Treating very low blood sugar If your blood sugar is at or below 54 mg/dL (3 mmol/L), you have very low blood sugar (severe hypoglycemia). This is an emergency. Do not wait to see if the symptoms will go away. Get medical help right away. Call your local emergency services (911 in the [place] Do not drive yourself to the hospital. If you have very  low blood sugar and you cannot eat or drink, you may need a glucagon shot (injection). A family member or friend should learn:  How to check your blood sugar.  How to give you a glucagon shot.  Ask your doctor if you need a glucagon shot kit at home. What else is important to manage my diabetes? Medicine Follow these instructions about insulin and diabetes medicines:  Take them as told by your doctor.  Adjust them as told by your doctor.  Do not run out of them.  Having diabetes can put you at risk for other long-term (chronic) conditions. These may include heart disease and kidney disease. Your doctor may prescribe medicines to help prevent problems from diabetes. Food   Make healthy food choices. These include: ? Chicken, fish, egg whites, and beans. ? Oats, whole wheat, bulgur, brown rice, quinoa, and millet. ? Fresh fruits and vegetables. ? Low-fat dairy products. ? Nuts, avocado, olive oil, and canola oil.  Meet with a food specialist (dietitian) to make an eating plan that is right for you.  Follow instructions from your doctor about what you cannot eat or drink.  Drink enough fluid to  keep your pee (urine) clear or pale yellow.  Eat healthy snacks between healthy meals.  Keep track of the carbs you eat. Read food labels. Learn the standard serving sizes of foods.  Follow your sick day plan when you cannot eat or drink normally. Make this plan with your doctor so it is ready. Activity  Exercise for 30 minutes or more a day during your pregnancy or as much as told by your doctor.  Talk with your doctor before you start a new exercise. Your doctor may need to adjust your insulin, medicines, or food. Lifestyle   Do not drink alcohol.  Do not use any tobacco products, such as cigarettes, chewing tobacco, and e-cigarettes. If you need help quitting, ask your doctor.  Learn how to deal with stress. If you need help with this, ask your doctor. Body care  Stay up to date with your shots (immunizations).  Get an eye exam during your first trimester.  Check your skin and feet every day. Check for cuts, bruises, redness, blisters, or sores.  Get regular foot exams as told by your doctor.  Brush your teeth and gums two times a day. Floss at least one time a day.  Go to the dentist least once every 6 months.  Stay at a healthy weight during your pregnancy. General instructions   Take over-the-counter and prescription medicines only as told by your doctor.  Talk with your doctor about your risk for high blood pressure during pregnancy (preeclampsia or eclampsia).  Share your diabetes care plan with: ? Your work or school. ? People you live with.  Check your pee for ketones: ? When you are sick. ? As told by your doctor.  Carry a card or wear jewelry that says that you have diabetes.  Ask your doctor: ? Do I need to meet with a diabetes educator? ? Where can I find a support group for people with diabetes?  Keep all follow-up visits with your doctor. This is important. Where to find more information: To learn more about diabetes, visit:  American  Diabetes Association: www.diabetes.org  American Association of Diabetes Educators (AADE): www.diabeteseducator.org/patient-resources  This information is not intended to replace advice given to you by your health care provider. Make sure you discuss any questions you have with your health care provider. Document   Released: 01/31/2016 Document Revised: 08/23/2016 Document Reviewed: 11/12/2015 Elsevier Interactive Patient Education  2017 [employer].

## 2017-09-25 NOTE — Progress Notes (Signed)
    Subjective:  Jodi Mcgee is a 31 y.o. [phone] at [redacted]w[redacted]d being seen today for ongoing prenatal care.  She is currently monitored for the following issues for this high-risk pregnancy and has Previous cesarean section complicating pregnancy; Supervision of other normal pregnancy, antepartum; Insufficient prenatal care; Type 2 diabetes mellitus complicating pregnancy, antepartum; Chronic pain syndrome; Unwanted fertility; and High blood pressure on their problem list.  Patient reports no complaints.  Contractions: Not present. Vag. Bleeding: None.  Movement: Present. Denies leaking of fluid.   The following portions of the patient's history were reviewed and updated as appropriate: allergies, current medications, past family history, past medical history, past social history, past surgical history and problem list. Problem list updated.  Objective:   Vitals:   09/25/17 0845  BP: 118/71  Pulse: (!) 102  Weight: 245 lb 4.8 oz (111.3 kg)    Fetal Status: Fetal Heart Rate (bpm): 145 Fundal Height: 32 cm Movement: Present     General:  Alert, oriented and cooperative. Patient is in no acute distress.  Skin: Skin is warm and dry. No rash noted.   Cardiovascular: Normal heart rate noted  Respiratory: Normal respiratory effort, no problems with respiration noted  Abdomen: Soft, gravid, appropriate for gestational age. Pain/Pressure: Present     Pelvic:  Cervical exam deferred        Extremities: Normal range of motion.  Edema: None  Mental Status: Normal mood and affect. Normal behavior. Normal judgment and thought content.      Assessment and Plan:  Pregnancy: G3P1102 at [redacted]w[redacted]d  1. Supervision of high risk pregnancy in third trimester with Type 2 diabetes Size greater than dates - watch fundal height - likely is related to polyhydramnios Is not yet taking low dose ASA - was not able to fill with Medicaid card.  Encouraged her to get the over the counter chewable baby aspirin and  begin taking.  Missed her appointment with the diabetes educator - will see her today in the clinic.  Encouraged client to consider breastfeeding but she is not at all interested in breastfeeding even with lots of encouragement and it being highly recommended.  Reports she is taking her metformin but is only checking her blood sugar occasionally - today after eating it was 145.  Advised to write down and bring in diary of blood sugars for provider to review.  - HIV antibody (with reflex) - CBC - RPR  2. Need for diphtheria-tetanus-pertussis (Tdap) vaccine Given today - Tdap vaccine greater than or equal to 7yo IM  3.  Polyhydramnious - AFI >97%  Previous ultrasound reviewed. Additional ultrasound scheduled by MFM at the end of December]  4.  Obesity - advised watching portion sizes as she gained 9 pounds in 4 weeks.  Preterm labor symptoms and general obstetric precautions including but not limited to vaginal bleeding, contractions, leaking of fluid and fetal movement were reviewed in detail with the patient. Please refer to After Visit Summary for other counseling recommendations.  Return in about 2 weeks (around 10/09/2017).  Earlie Server, RN, MSN, NP-BC Nurse Practitioner, [facility] for [employer], [place] [facility] 09/25/2017 2:38 PM  CBC, HIV, RPR, TDaP today

## 2017-09-26 LAB — CBC
HEMATOCRIT: 30.6 % — AB (ref 34.0–46.6)
Hemoglobin: 9.5 g/dL — ABNORMAL LOW (ref 11.1–15.9)
MCH: 26.3 pg — AB (ref 26.6–33.0)
MCHC: 31 g/dL — ABNORMAL LOW (ref 31.5–35.7)
MCV: 85 fL (ref 79–97)
Platelets: 234 10*3/uL (ref 150–379)
RBC: 3.61 x10E6/uL — ABNORMAL LOW (ref 3.77–5.28)
RDW: 15 % (ref 12.3–15.4)
WBC: 7.1 10*3/uL (ref 3.4–10.8)

## 2017-09-26 LAB — HIV ANTIBODY (ROUTINE TESTING W REFLEX): HIV Screen 4th Generation wRfx: NONREACTIVE

## 2017-09-26 LAB — RPR: RPR Ser Ql: NONREACTIVE

## 2017-10-05 ENCOUNTER — Encounter (HOSPITAL_BASED_OUTPATIENT_CLINIC_OR_DEPARTMENT_OTHER): Payer: Self-pay | Admitting: Emergency Medicine

## 2017-10-05 ENCOUNTER — Other Ambulatory Visit: Payer: Self-pay

## 2017-10-05 ENCOUNTER — Emergency Department (HOSPITAL_BASED_OUTPATIENT_CLINIC_OR_DEPARTMENT_OTHER)
Admission: EM | Admit: 2017-10-05 | Discharge: 2017-10-06 | Disposition: A | Discharge status: Home / Self Care | Payer: Medicaid Other | Attending: Emergency Medicine | Admitting: Emergency Medicine

## 2017-10-05 DIAGNOSIS — G4489 Other headache syndrome: Secondary | ICD-10-CM | POA: Diagnosis not present

## 2017-10-05 DIAGNOSIS — R040 Epistaxis: Secondary | ICD-10-CM | POA: Diagnosis not present

## 2017-10-05 DIAGNOSIS — O24113 Pre-existing diabetes mellitus, type 2, in pregnancy, third trimester: Secondary | ICD-10-CM | POA: Diagnosis not present

## 2017-10-05 DIAGNOSIS — Z3493 Encounter for supervision of normal pregnancy, unspecified, third trimester: Secondary | ICD-10-CM

## 2017-10-05 DIAGNOSIS — Z3A30 30 weeks gestation of pregnancy: Secondary | ICD-10-CM | POA: Diagnosis not present

## 2017-10-05 DIAGNOSIS — Z7982 Long term (current) use of aspirin: Secondary | ICD-10-CM | POA: Insufficient documentation

## 2017-10-05 DIAGNOSIS — O26893 Other specified pregnancy related conditions, third trimester: Secondary | ICD-10-CM | POA: Insufficient documentation

## 2017-10-05 DIAGNOSIS — Z7984 Long term (current) use of oral hypoglycemic drugs: Secondary | ICD-10-CM | POA: Diagnosis not present

## 2017-10-05 DIAGNOSIS — Z87891 Personal history of nicotine dependence: Secondary | ICD-10-CM | POA: Insufficient documentation

## 2017-10-05 LAB — COMPREHENSIVE METABOLIC PANEL
ALK PHOS: 66 U/L (ref 38–126)
ALT: 11 U/L — AB (ref 14–54)
AST: 15 U/L (ref 15–41)
Albumin: 2.7 g/dL — ABNORMAL LOW (ref 3.5–5.0)
Anion gap: 7 (ref 5–15)
BUN: 11 mg/dL (ref 6–20)
CALCIUM: 8.4 mg/dL — AB (ref 8.9–10.3)
CO2: 21 mmol/L — AB (ref 22–32)
CREATININE: 0.43 mg/dL — AB (ref 0.44–1.00)
Chloride: 105 mmol/L (ref 101–111)
Glucose, Bld: 158 mg/dL — ABNORMAL HIGH (ref 65–99)
Potassium: 3.5 mmol/L (ref 3.5–5.1)
SODIUM: 133 mmol/L — AB (ref 135–145)
Total Bilirubin: 0.4 mg/dL (ref 0.3–1.2)
Total Protein: 6.9 g/dL (ref 6.5–8.1)

## 2017-10-05 LAB — URINALYSIS, ROUTINE W REFLEX MICROSCOPIC
Bilirubin Urine: NEGATIVE
Glucose, UA: NEGATIVE mg/dL
Hgb urine dipstick: NEGATIVE
Ketones, ur: 15 mg/dL — AB
Leukocytes, UA: NEGATIVE
NITRITE: NEGATIVE
Protein, ur: NEGATIVE mg/dL
pH: 6.5 (ref 5.0–8.0)

## 2017-10-05 LAB — CBC WITH DIFFERENTIAL/PLATELET
Basophils Absolute: 0 10*3/uL (ref 0.0–0.1)
Basophils Relative: 0 %
Eosinophils Absolute: 0 10*3/uL (ref 0.0–0.7)
Eosinophils Relative: 1 %
HEMATOCRIT: 28.2 % — AB (ref 36.0–46.0)
HEMOGLOBIN: 9.1 g/dL — AB (ref 12.0–15.0)
LYMPHS ABS: 0.9 10*3/uL (ref 0.7–4.0)
LYMPHS PCT: 15 %
MCH: 27 pg (ref 26.0–34.0)
MCHC: 32.3 g/dL (ref 30.0–36.0)
MCV: 83.7 fL (ref 78.0–100.0)
Monocytes Absolute: 0.5 10*3/uL (ref 0.1–1.0)
Monocytes Relative: 8 %
NEUTROS PCT: 76 %
Neutro Abs: 4.9 10*3/uL (ref 1.7–7.7)
Platelets: 219 10*3/uL (ref 150–400)
RBC: 3.37 MIL/uL — ABNORMAL LOW (ref 3.87–5.11)
RDW: 14.7 % (ref 11.5–15.5)
WBC: 6.4 10*3/uL (ref 4.0–10.5)

## 2017-10-05 MED ORDER — ACETAMINOPHEN 500 MG PO TABS
1000.0000 mg | ORAL_TABLET | Freq: Once | ORAL | Status: AC
  Administered 2017-10-05: 1000 mg via ORAL
  Filled 2017-10-05: qty 2

## 2017-10-05 MED ORDER — LACTATED RINGERS IV BOLUS (SEPSIS)
1000.0000 mL | Freq: Once | INTRAVENOUS | Status: AC
  Administered 2017-10-05: 1000 mL via INTRAVENOUS

## 2017-10-05 NOTE — ED Notes (Signed)
Rapid Response called and pt is on TOCO

## 2017-10-05 NOTE — ED Notes (Signed)
Nurse is at bedside holding fetal monitor probe in place, pt denies any contractions, no distress, no pressure, fetal heart rate ranging from 135-150, and baby is active.
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
## 2017-10-05 NOTE — ED Notes (Signed)
Pt has been cleared by Dr. Ilda Basset at [facility] per Arletta Bale, RN Rapid Response nurse

## 2017-10-05 NOTE — Progress Notes (Signed)
Donnella Bi, RN patient OB cleared. Lab work done, IVF's now going.
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
## 2017-10-05 NOTE — Discharge Instructions (Signed)
Continue taking tylenol for pain and headaches.   Stay hydrated.   Follow up with your OB doctor.   You had some packing in the left nostril that will eventually come out.   Please use humidifier at night.   See ENT if you have persistent nosebleeds.   Return to ER if you have worse headaches, vaginal bleeding, vomiting, weakness, uncontrolled nosebleeds.

## 2017-10-05 NOTE — Progress Notes (Signed)
Call to Dr Ilda Basset, updated on patient ED visit to [facility], Updated on status, complaints and FHT. Patient OB cleared per Dr. Ilda Basset. Will advise RN at [facility].

## 2017-10-05 NOTE — ED Notes (Signed)
Educated pt on some more nutritional choices for her diet while she is pregnant.  Patient given a snack and some ice chips.
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
## 2017-10-05 NOTE — ED Triage Notes (Addendum)
PT presents with c/o headache and nosebleed for weeks . No bleeding in triage. PT is [redacted] weeks pregnant.

## 2017-10-06 NOTE — ED Notes (Signed)
Pt verbalizes understanding of d/c instructions and denies any further needs at this time. 

## 2017-10-11 ENCOUNTER — Other Ambulatory Visit: Payer: Self-pay | Admitting: Nurse Practitioner

## 2017-10-11 ENCOUNTER — Encounter: Payer: Self-pay | Admitting: Nurse Practitioner

## 2017-10-11 ENCOUNTER — Ambulatory Visit (INDEPENDENT_AMBULATORY_CARE_PROVIDER_SITE_OTHER): Payer: Medicaid Other | Admitting: Advanced Practice Midwife

## 2017-10-11 VITALS — BP 114/55 | HR 104 | Wt 245.7 lb

## 2017-10-11 DIAGNOSIS — O26893 Other specified pregnancy related conditions, third trimester: Secondary | ICD-10-CM

## 2017-10-11 DIAGNOSIS — L299 Pruritus, unspecified: Secondary | ICD-10-CM

## 2017-10-11 DIAGNOSIS — O24113 Pre-existing diabetes mellitus, type 2, in pregnancy, third trimester: Secondary | ICD-10-CM | POA: Diagnosis not present

## 2017-10-11 DIAGNOSIS — E119 Type 2 diabetes mellitus without complications: Secondary | ICD-10-CM | POA: Diagnosis not present

## 2017-10-11 DIAGNOSIS — R109 Unspecified abdominal pain: Secondary | ICD-10-CM

## 2017-10-11 DIAGNOSIS — O24119 Pre-existing diabetes mellitus, type 2, in pregnancy, unspecified trimester: Secondary | ICD-10-CM

## 2017-10-11 DIAGNOSIS — O403XX0 Polyhydramnios, third trimester, not applicable or unspecified: Secondary | ICD-10-CM

## 2017-10-11 DIAGNOSIS — I10 Essential (primary) hypertension: Secondary | ICD-10-CM

## 2017-10-11 DIAGNOSIS — O99713 Diseases of the skin and subcutaneous tissue complicating pregnancy, third trimester: Secondary | ICD-10-CM

## 2017-10-11 DIAGNOSIS — O368130 Decreased fetal movements, third trimester, not applicable or unspecified: Secondary | ICD-10-CM

## 2017-10-11 DIAGNOSIS — O34219 Maternal care for unspecified type scar from previous cesarean delivery: Secondary | ICD-10-CM

## 2017-10-11 DIAGNOSIS — R350 Frequency of micturition: Secondary | ICD-10-CM

## 2017-10-11 DIAGNOSIS — O99013 Anemia complicating pregnancy, third trimester: Secondary | ICD-10-CM | POA: Insufficient documentation

## 2017-10-11 DIAGNOSIS — K219 Gastro-esophageal reflux disease without esophagitis: Secondary | ICD-10-CM

## 2017-10-11 DIAGNOSIS — O99613 Diseases of the digestive system complicating pregnancy, third trimester: Secondary | ICD-10-CM

## 2017-10-11 DIAGNOSIS — O219 Vomiting of pregnancy, unspecified: Secondary | ICD-10-CM

## 2017-10-11 LAB — POCT URINALYSIS DIP (DEVICE)
Glucose, UA: NEGATIVE mg/dL
HGB URINE DIPSTICK: NEGATIVE
Ketones, ur: 15 mg/dL — AB
LEUKOCYTES UA: NEGATIVE
Nitrite: NEGATIVE
Protein, ur: 30 mg/dL — AB
Urobilinogen, UA: 1 mg/dL (ref 0.0–1.0)
pH: 6.5 (ref 5.0–8.0)

## 2017-10-11 LAB — FETAL FIBRONECTIN: Fetal Fibronectin: NEGATIVE

## 2017-10-11 LAB — GLUCOSE, CAPILLARY: Glucose-Capillary: 178 mg/dL — ABNORMAL HIGH (ref 65–99)

## 2017-10-11 MED ORDER — FAMOTIDINE 20 MG PO TABS: 20.0000 mg | ORAL_TABLET | Freq: Two times a day (BID) | ORAL | 3 refills | Status: DC

## 2017-10-11 MED ORDER — TRIAMCINOLONE ACETONIDE 0.1 % EX OINT: 1.0000 "application " | TOPICAL_OINTMENT | Freq: Two times a day (BID) | CUTANEOUS | 0 refills | Status: DC

## 2017-10-11 MED ORDER — ONDANSETRON HCL 4 MG PO TABS: 4.0000 mg | ORAL_TABLET | Freq: Three times a day (TID) | ORAL | 2 refills | Status: DC | PRN

## 2017-10-11 MED ORDER — GLYBURIDE 2.5 MG PO TABS: 2.5000 mg | ORAL_TABLET | Freq: Two times a day (BID) | ORAL | 3 refills | Status: DC

## 2017-10-11 NOTE — Progress Notes (Signed)
HGB is now 9.  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  Will instruct client to do this at her next prenatal visit - note made on sticky note on chart. Earlie Server, RN, MSN, NP-BC Nurse Practitioner, [facility] for [employer], [place] [facility] 10/11/2017 11:31 AM

## 2017-10-11 NOTE — Progress Notes (Signed)
   PRENATAL VISIT NOTE  Subjective:  Jodi Mcgee is a 31 y.o. [id number] at [redacted]w[redacted]d being seen today for ongoing prenatal care.  She is currently monitored for the following issues for this high-risk pregnancy and has Previous cesarean section complicating pregnancy; Supervision of other normal pregnancy, antepartum; Insufficient prenatal care; Type 2 diabetes mellitus complicating pregnancy, antepartum; Chronic pain syndrome; Unwanted fertility; High blood pressure; Obesity (BMI 30-39.9); and Polyhydramnios affecting pregnancy in third trimester on their problem list.  Patient reports heartburn, nausea, no bleeding, no leaking, vomiting and low abd and pelvic pain, urinary frequency, decreased fetal mvmt. Pain started several weeks ago, but is getting worse. Unsure if she is having contractions. N/V have been going on throughout the pregnancy.  Contractions: Unsure,  Vag. Bleeding: None.  Movement: (!) Decreased. Denies leaking of fluid.   The following portions of the patient's history were reviewed and updated as appropriate: allergies, current medications, past family history, past medical history, past social history, past surgical history and problem list. Problem list updated.  Objective:   Vitals:   10/11/17 0834  BP: (!) 114/55  Pulse: (!) 104  Weight: 245 lb 11.2 oz (111.4 kg)    Fetal Status: Fetal Heart Rate (bpm): 145 Fundal Height: 34 cm Movement: (!) Decreased     General:  Alert, oriented and cooperative. Patient is in no acute distress.  Skin: Skin is warm and dry. No rash noted.   Cardiovascular: Normal heart rate noted  Respiratory: Normal respiratory effort, no problems with respiration noted  Abdomen: Soft, gravid, appropriate for gestational age.  Pain/Pressure: Present     Pelvic: Cervical exam performed   0/0/high  Extremities: Normal range of motion.  Edema: Trace  Mental Status:  Normal mood and affect. Normal behavior. Normal judgment and thought content.    Fasting CBGs: 140's, nit not true fasting because of work schedule ([employer] late, eats late, up early w/ children and eats early (100% out of range) PCB/L/D:  140-170 (100% out of range)  Assessment and Plan:  Pregnancy: G3P1102 at [redacted]w[redacted]d 1. Type 2 diabetes mellitus complicating pregnancy, antepartum-Uncontrolled - Continue Metformin 1000 BID - Start GlyBURIDE (DIABETA) 2.5 MG tablet; Take 1 tablet (2.5 mg total) by mouth 2 (two) times daily with a meal.  Dispense: 60 tablet; Refill: 3  2. Previous cesarean section complicating pregnancy - Plans repeat  3. Polyhydramnios affecting pregnancy in third trimester--mild  - F/U US 12/30  4. Essential hypertension - Antenatal testing starting at 32 weeks  5. Pruritus of pregnancy in third trimester - Bile acids, total - Comprehensive metabolic panel - Triamcinolone   6. Abdominal pain during pregnancy in third trimester  - Urine culture - Fetal fibronectin - Fetal nonstress test  7. Urinary frequency--Nml UA  - Culture, OB Urine  8. Gastroesophageal reflux during pregnancy in third trimester, antepartum  - famotidine (PEPCID) 20 MG tablet; Take 1 tablet (20 mg total) by mouth 2 (two) times daily.  Dispense: 60 tablet; Refill: 3  9. Nausea and vomiting of pregnancy, antepartum  - refill Zofran  10. Decreased fetal movements in third trimester, single or unspecified fetus  - Fetal nonstress test  Preterm labor symptoms and general obstetric precautions including but not limited to vaginal bleeding, contractions, leaking of fluid and fetal movement were reviewed in detail with the patient. Please refer to After Visit Summary for other counseling recommendations.  F/I 1 week to eval [place], CNM

## 2017-10-12 LAB — COMPREHENSIVE METABOLIC PANEL
A/G RATIO: 1.1 — AB (ref 1.2–2.2)
ALK PHOS: 78 IU/L (ref 39–117)
ALT: 8 IU/L (ref 0–32)
AST: 11 IU/L (ref 0–40)
Albumin: 3.2 g/dL — ABNORMAL LOW (ref 3.5–5.5)
BUN/Creatinine Ratio: 16 (ref 9–23)
BUN: 9 mg/dL (ref 6–20)
CHLORIDE: 103 mmol/L (ref 96–106)
CO2: 20 mmol/L (ref 20–29)
Calcium: 8.8 mg/dL (ref 8.7–10.2)
Creatinine, Ser: 0.57 mg/dL (ref 0.57–1.00)
GFR calc Af Amer: 142 mL/min/{1.73_m2} (ref >59)
GFR, EST NON AFRICAN AMERICAN: 123 mL/min/{1.73_m2} (ref >59)
GLOBULIN, TOTAL: 2.9 g/dL (ref 1.5–4.5)
Glucose: 161 mg/dL — ABNORMAL HIGH (ref 65–99)
POTASSIUM: 3.6 mmol/L (ref 3.5–5.2)
SODIUM: 137 mmol/L (ref 134–144)
Total Protein: 6.1 g/dL (ref 6.0–8.5)

## 2017-10-12 LAB — FETAL FIBRONECTIN

## 2017-10-12 LAB — BILE ACIDS, TOTAL: BILE ACIDS TOTAL: 7.1 umol/L (ref 4.7–24.5)

## 2017-10-13 LAB — CULTURE, OB URINE

## 2017-10-13 LAB — URINE CULTURE, OB REFLEX

## 2017-10-17 ENCOUNTER — Telehealth: Payer: Self-pay | Admitting: General Practice

## 2017-10-17 ENCOUNTER — Encounter (HOSPITAL_COMMUNITY): Payer: Self-pay

## 2017-10-17 ENCOUNTER — Inpatient Hospital Stay (HOSPITAL_COMMUNITY)
Admission: AD | Admit: 2017-10-17 | Discharge: 2017-10-17 | Disposition: A | Discharge status: Home / Self Care | Payer: Medicaid Other | Source: Ambulatory Visit | Attending: Family Medicine | Admitting: Family Medicine

## 2017-10-17 DIAGNOSIS — O212 Late vomiting of pregnancy: Secondary | ICD-10-CM | POA: Insufficient documentation

## 2017-10-17 DIAGNOSIS — D140 Benign neoplasm of middle ear, nasal cavity and accessory sinuses: Secondary | ICD-10-CM | POA: Insufficient documentation

## 2017-10-17 DIAGNOSIS — R102 Pelvic and perineal pain: Secondary | ICD-10-CM | POA: Diagnosis present

## 2017-10-17 DIAGNOSIS — F1411 Cocaine abuse, in remission: Secondary | ICD-10-CM | POA: Insufficient documentation

## 2017-10-17 DIAGNOSIS — R040 Epistaxis: Secondary | ICD-10-CM | POA: Insufficient documentation

## 2017-10-17 DIAGNOSIS — O26893 Other specified pregnancy related conditions, third trimester: Secondary | ICD-10-CM | POA: Diagnosis not present

## 2017-10-17 DIAGNOSIS — O219 Vomiting of pregnancy, unspecified: Secondary | ICD-10-CM | POA: Diagnosis not present

## 2017-10-17 DIAGNOSIS — Z3A31 31 weeks gestation of pregnancy: Secondary | ICD-10-CM | POA: Insufficient documentation

## 2017-10-17 DIAGNOSIS — J3489 Other specified disorders of nose and nasal sinuses: Secondary | ICD-10-CM | POA: Insufficient documentation

## 2017-10-17 DIAGNOSIS — O26899 Other specified pregnancy related conditions, unspecified trimester: Secondary | ICD-10-CM

## 2017-10-17 LAB — URINALYSIS, ROUTINE W REFLEX MICROSCOPIC
Bilirubin Urine: NEGATIVE
Glucose, UA: NEGATIVE mg/dL
HGB URINE DIPSTICK: NEGATIVE
Ketones, ur: NEGATIVE mg/dL
Leukocytes, UA: NEGATIVE
NITRITE: NEGATIVE
PROTEIN: 30 mg/dL — AB
Specific Gravity, Urine: 1.029 (ref 1.005–1.030)
pH: 5 (ref 5.0–8.0)

## 2017-10-17 MED ORDER — CYCLOBENZAPRINE HCL 5 MG PO TABS: 5.0000 mg | ORAL_TABLET | Freq: Three times a day (TID) | ORAL | 0 refills | Status: DC | PRN

## 2017-10-17 MED ORDER — ONDANSETRON 4 MG PO TBDP: 4.0000 mg | ORAL_TABLET | Freq: Three times a day (TID) | ORAL | 0 refills | Status: DC | PRN

## 2017-10-17 MED ORDER — CYCLOBENZAPRINE HCL 10 MG PO TABS
10.0000 mg | ORAL_TABLET | Freq: Once | ORAL | Status: AC
  Administered 2017-10-17: 10 mg via ORAL
  Filled 2017-10-17: qty 1

## 2017-10-17 MED ORDER — ONDANSETRON 4 MG PO TBDP
4.0000 mg | ORAL_TABLET | Freq: Once | ORAL | Status: AC
  Administered 2017-10-17: 4 mg via ORAL
  Filled 2017-10-17: qty 1

## 2017-10-17 MED ORDER — COMFORT FIT MATERNITY SUPP LG MISC: 1.0000 [IU] | Freq: Every day | 0 refills | Status: DC

## 2017-10-17 NOTE — Discharge Instructions (Signed)

## 2017-10-17 NOTE — Telephone Encounter (Signed)
Called patient, no answer- left message stating we are trying to reach you to inform you, your recent tests in our office on the 20th were normal. You may call us back if you have questions

## 2017-10-17 NOTE — MAU Provider Note (Signed)
Chief Complaint:  Pelvic Pain   First Provider Initiated Contact with Patient 10/17/17 0855     HPI  HPI: Jodi Mcgee is a 31 y.o. [id number] at 26w4dwho presents to maternity admissions reporting abdominal & pelvic pain. Symptoms began yesterday. Reports constant pelvic pain with intermittent sharp lower abdominal pain. Pain worse with movement & walking. Rates pain 8/10. Endorses nausea/vomiting throughout pregnancy. Out of antiemetic. Has vomited twice today.  She reports good fetal movement, denies LOF, vaginal bleeding, vaginal itching/burning, urinary symptoms, h/a, dizziness, n/v, diarrhea, constipation or fever/chills.  She denies headache, visual changes or RUQ abdominal pain.   Past Medical History: Past Medical History:  Diagnosis Date  . Anemia   . Anxiety   . Diabetes mellitus without complication ([place])   . GERD (gastroesophageal reflux disease)    has resolved  . IBS (irritable bowel syndrome)   . Ovarian cyst   . Peritonitis, acute generalized ([place])   . Sepsis ([place])     Past obstetric history: OB History  Gravida Para Term Preterm AB Living  3 2 1 1  0 2  SAB TAB Ectopic Multiple Live Births  0 0 0   2    # Outcome Date GA Lbr Len/2nd Weight Sex Delivery Anes PTL Lv  3 Current           2 Term 06/24/08 [redacted]w[redacted]d   F CS-LTranv Spinal N LIV  1 Preterm 04/08/07 [redacted]w[redacted]d   M CS-LTranv Spinal  LIV      Past Surgical History: Past Surgical History:  Procedure Laterality Date  . APPENDECTOMY     ruptured   . CESAREAN SECTION    . OVARIAN CYST DRAINAGE    . SMALL BOWEL REPAIR      Family History: History reviewed. No pertinent family history.  Social History: Social History   Tobacco Use  . Smoking status: Former Smoker    Packs/day: 0.50    Types: Cigarettes  . Smokeless tobacco: Never Used  Substance Use Topics  . Alcohol use: No    Comment: occassional  . Drug use: No    Allergies:  Allergies  Allergen Reactions  . Cetirizine & Related   .  Contrast Media [Iodinated Diagnostic Agents] Itching  . Nsaids Other (See Comments)    None per gi  . Toradol [Ketorolac Tromethamine] Hives and Itching  . Contrast Media [Iodinated Diagnostic Agents] Itching    Mild itching after IV contrast on 6/1/7 No urticaria visible No wheezing No angioedema  . Nsaids Rash    Meds:  No medications prior to admission.    I have reviewed patient's Past Medical Hx, Surgical Hx, Family Hx, Social Hx, medications and allergies.   ROS:  Review of Systems  Constitutional: Negative.   Gastrointestinal: Positive for abdominal pain, nausea and vomiting. Negative for constipation and diarrhea.  Genitourinary: Positive for pelvic pain. Negative for dysuria, vaginal bleeding and vaginal discharge.   Other systems negative  Physical Exam   Patient Vitals for the past 24 hrs:  BP Temp Temp src Pulse Resp Height Weight  10/17/17 1003 123/70 - - - - - -  10/17/17 0826 123/70 97.6 F (36.4 C) Oral (!) 103 20 5\' 6"  (1.676 m) 247 lb (112 kg)   Constitutional: Well-developed, well-nourished female in no acute distress.  Cardiovascular: normal rate and rhythm Respiratory: normal effort, clear to auscultation bilaterally GI: Abd soft, non-tender, gravid appropriate for gestational age.   No rebound or guarding. MS: Extremities nontender, no edema, normal  ROM Neurologic: Alert and oriented x 4.  GU: Neg CVAT.  Dilation: Closed Effacement (%): Thick Cervical Position: Anterior Exam by:: e Hilton Saephan np     FHT:  Baseline 140 , moderate variability, accelerations present, no decelerations Contractions: none   Labs: Results for orders placed or performed during the hospital encounter of 10/17/17 (from the past 24 hour(s))  Urinalysis, Routine w reflex microscopic     Status: Abnormal   Collection Time: 10/17/17  8:31 AM  Result Value Ref Range   Color, Urine YELLOW YELLOW   APPearance HAZY (A) CLEAR   Specific Gravity, Urine 1.029 1.005 - 1.030    pH 5.0 5.0 - 8.0   Glucose, UA NEGATIVE NEGATIVE mg/dL   Hgb urine dipstick NEGATIVE NEGATIVE   Bilirubin Urine NEGATIVE NEGATIVE   Ketones, ur NEGATIVE NEGATIVE mg/dL   Protein, ur 30 (A) NEGATIVE mg/dL   Nitrite NEGATIVE NEGATIVE   Leukocytes, UA NEGATIVE NEGATIVE   RBC / HPF 0-5 0 - 5 RBC/hpf   WBC, UA 0-5 0 - 5 WBC/hpf   Bacteria, UA RARE (A) NONE SEEN   Squamous Epithelial / LPF 6-30 (A) NONE SEEN   Mucus PRESENT    O/Positive/-- (11/01 1058)  Imaging:    MAU Course/MDM: I have ordered labs and reviewed results.  NST reviewed Cervix closed/thick. No ctx palpated or seen on TOCO  Assessment: 1. Nausea and vomiting during pregnancy prior to [redacted] weeks gestation   2. Pain of round ligament affecting pregnancy, antepartum   3. Pelvic pain affecting pregnancy in third trimester, antepartum   4. [redacted] weeks gestation of pregnancy     Plan: Discharge home PTL precautions and fetal kick counts Rx zofran, flexeril, & maternity support belt Follow up in Office for prenatal visits and recheck   Pt stable at time of discharge.  Jorje Guild, FNP 10/17/2017 2:35 PM

## 2017-10-17 NOTE — MAU Note (Addendum)
Pt C/O intense pelvic pain that started yesterday, hurts constantly, then has intermittent sharp pain.  Sharp pain occurs with walking or changing positions.  Pt also states she has been lightheaded & vomiting since yesterday, has vomited twice this morning, no diarrhea.  Denies bleeding or LOF.

## 2017-10-17 NOTE — Telephone Encounter (Signed)
-----   Message from [place], [place] sent at 10/12/2017 11:35 AM EST ----- Please inform pt of neg fFN

## 2017-10-18 LAB — CULTURE, OB URINE: Culture: NO GROWTH

## 2017-10-22 ENCOUNTER — Ambulatory Visit (INDEPENDENT_AMBULATORY_CARE_PROVIDER_SITE_OTHER): Payer: Medicaid Other | Admitting: Obstetrics and Gynecology

## 2017-10-22 ENCOUNTER — Encounter (HOSPITAL_COMMUNITY): Payer: Self-pay

## 2017-10-22 ENCOUNTER — Other Ambulatory Visit (HOSPITAL_COMMUNITY): Payer: Self-pay | Admitting: *Deleted

## 2017-10-22 ENCOUNTER — Other Ambulatory Visit (HOSPITAL_COMMUNITY): Payer: Self-pay | Admitting: Maternal and Fetal Medicine

## 2017-10-22 ENCOUNTER — Encounter: Payer: Self-pay | Admitting: Obstetrics and Gynecology

## 2017-10-22 ENCOUNTER — Ambulatory Visit (HOSPITAL_COMMUNITY)
Admission: RE | Admit: 2017-10-22 | Discharge: 2017-10-22 | Disposition: A | Discharge status: Home / Self Care | Payer: Medicaid Other | Source: Ambulatory Visit | Attending: Family Medicine | Admitting: Family Medicine

## 2017-10-22 VITALS — BP 104/49 | HR 88 | Wt 247.2 lb

## 2017-10-22 DIAGNOSIS — O403XX0 Polyhydramnios, third trimester, not applicable or unspecified: Secondary | ICD-10-CM | POA: Diagnosis not present

## 2017-10-22 DIAGNOSIS — Z3A32 32 weeks gestation of pregnancy: Secondary | ICD-10-CM | POA: Diagnosis not present

## 2017-10-22 DIAGNOSIS — Z7984 Long term (current) use of oral hypoglycemic drugs: Principal | ICD-10-CM

## 2017-10-22 DIAGNOSIS — N736 Female pelvic peritoneal adhesions (postinfective): Secondary | ICD-10-CM

## 2017-10-22 DIAGNOSIS — O24119 Pre-existing diabetes mellitus, type 2, in pregnancy, unspecified trimester: Secondary | ICD-10-CM

## 2017-10-22 DIAGNOSIS — O0993 Supervision of high risk pregnancy, unspecified, third trimester: Secondary | ICD-10-CM

## 2017-10-22 DIAGNOSIS — O24319 Unspecified pre-existing diabetes mellitus in pregnancy, unspecified trimester: Secondary | ICD-10-CM | POA: Diagnosis not present

## 2017-10-22 DIAGNOSIS — Z3009 Encounter for other general counseling and advice on contraception: Secondary | ICD-10-CM

## 2017-10-22 DIAGNOSIS — O3660X0 Maternal care for excessive fetal growth, unspecified trimester, not applicable or unspecified: Secondary | ICD-10-CM

## 2017-10-22 DIAGNOSIS — Z3480 Encounter for supervision of other normal pregnancy, unspecified trimester: Secondary | ICD-10-CM

## 2017-10-22 DIAGNOSIS — O34219 Maternal care for unspecified type scar from previous cesarean delivery: Secondary | ICD-10-CM

## 2017-10-22 NOTE — Addendum Note (Signed)
Addended by: Wendelyn Breslow L on: 10/22/2017 11:28 AM   Modules accepted: Orders

## 2017-10-22 NOTE — Progress Notes (Signed)
Prenatal Visit Note Date: 10/22/2017 Clinic: [facility]  Subjective:  Jodi Mcgee is a 31 y.o. [phone] at [redacted]w[redacted]d being seen today for ongoing prenatal care.  She is currently monitored for the following issues for this high-risk pregnancy and has Previous cesarean section complicating pregnancy; Supervision of other normal pregnancy, antepartum; Insufficient prenatal care; Type 2 diabetes mellitus complicating pregnancy, antepartum; Chronic pain syndrome; Unwanted fertility; High blood pressure; Obesity (BMI 30-39.9); Polyhydramnios affecting pregnancy in third trimester; Anemia affecting pregnancy in third trimester; Pelvic adhesive disease; and LGA (large for gestational age) fetus affecting management of mother on their problem list.  Patient reports anterior low belly discomfort.   Contractions: Not present. Vag. Bleeding: None.  Movement: Present. Denies leaking of fluid.   The following portions of the patient's history were reviewed and updated as appropriate: allergies, current medications, past family history, past medical history, past social history, past surgical history and problem list. Problem list updated.  Objective:   Vitals:   10/22/17 1024  BP: (!) 104/49  Pulse: 88  Weight: 247 lb 3.2 oz (112.1 kg)    Fetal Status: Fetal Heart Rate (bpm): rNST   Movement: Present     General:  Alert, oriented and cooperative. Patient is in no acute distress.  Skin: Skin is warm and dry. No rash noted.   Cardiovascular: Normal heart rate noted  Respiratory: Normal respiratory effort, no problems with respiration noted  Abdomen: Soft, gravid, appropriate for gestational age. Pain/Pressure: Present     Pelvic:  Cervical exam deferred        Extremities: Normal range of motion.  Edema: Trace  Mental Status: Normal mood and affect. Normal behavior. Normal judgment and thought content.   Urinalysis:      Assessment and Plan:  Pregnancy: G3P1102 at  [redacted]w[redacted]d  1. Supervision of high risk pregnancy in third trimester Routine care. F/u with pt re: BC nv. Was thinking IUD in the past.   2. Type 2 diabetes mellitus complicating pregnancy, antepartum LGA with normal AFI today. Pt states she takes metformin 1000/1000 and glyburide 2.5/2.5 with breakfast and dinner. She states she's not taking her BS not b/c she doesn't want to but forgets. Importance of good BS control d/w pt including risks to mom and fetus, particularly IUFD. She states she was on insulin in the past but not when she was pregnant. Will get a1c today and continue qwk ap testing. rNST today. Follow up with pt next visit if she went to fetal echo visit - Fetal nonstress test  3. Polyhydramnios affecting pregnancy in third trimester Resolved on u/s today.   4. H/o c-section Patient states she had c-section x 2 and then her open appy in [place]. [facility] in [place] Will have her sign ROI form to get op note. Would recommend c-section is done on a weekday.   Preterm labor symptoms and general obstetric precautions including but not limited to vaginal bleeding, contractions, leaking of fluid and fetal movement were reviewed in detail with the patient. Please refer to After Visit Summary for other counseling recommendations.  Return in about 1 week (around 10/29/2017) for NST/BPP and HOB.   Aletha Halim, MD  \

## 2017-10-23 LAB — HEMOGLOBIN A1C
Est. average glucose Bld gHb Est-mCnc: 128 mg/dL
Hgb A1c MFr Bld: 6.1 % — ABNORMAL HIGH (ref 4.8–5.6)

## 2017-10-29 ENCOUNTER — Encounter: Payer: Self-pay | Admitting: Family Medicine

## 2017-10-29 ENCOUNTER — Ambulatory Visit (INDEPENDENT_AMBULATORY_CARE_PROVIDER_SITE_OTHER): Payer: Medicaid Other | Admitting: Family Medicine

## 2017-10-29 ENCOUNTER — Ambulatory Visit: Payer: Self-pay

## 2017-10-29 ENCOUNTER — Other Ambulatory Visit: Payer: Self-pay

## 2017-10-29 ENCOUNTER — Ambulatory Visit (INDEPENDENT_AMBULATORY_CARE_PROVIDER_SITE_OTHER): Payer: Medicaid Other | Admitting: *Deleted

## 2017-10-29 ENCOUNTER — Emergency Department (HOSPITAL_BASED_OUTPATIENT_CLINIC_OR_DEPARTMENT_OTHER)
Admission: EM | Admit: 2017-10-29 | Discharge: 2017-10-29 | Disposition: A | Discharge status: Home / Self Care | Payer: Medicaid Other | Attending: Emergency Medicine | Admitting: Emergency Medicine

## 2017-10-29 ENCOUNTER — Encounter (HOSPITAL_BASED_OUTPATIENT_CLINIC_OR_DEPARTMENT_OTHER): Payer: Self-pay

## 2017-10-29 ENCOUNTER — Emergency Department (HOSPITAL_BASED_OUTPATIENT_CLINIC_OR_DEPARTMENT_OTHER): Payer: Medicaid Other

## 2017-10-29 VITALS — BP 125/56 | HR 102 | Wt 248.9 lb

## 2017-10-29 DIAGNOSIS — O34219 Maternal care for unspecified type scar from previous cesarean delivery: Secondary | ICD-10-CM

## 2017-10-29 DIAGNOSIS — Z3A34 34 weeks gestation of pregnancy: Secondary | ICD-10-CM | POA: Diagnosis not present

## 2017-10-29 DIAGNOSIS — O24119 Pre-existing diabetes mellitus, type 2, in pregnancy, unspecified trimester: Secondary | ICD-10-CM

## 2017-10-29 DIAGNOSIS — M79604 Pain in right leg: Secondary | ICD-10-CM

## 2017-10-29 DIAGNOSIS — R200 Anesthesia of skin: Secondary | ICD-10-CM | POA: Insufficient documentation

## 2017-10-29 DIAGNOSIS — O24113 Pre-existing diabetes mellitus, type 2, in pregnancy, third trimester: Secondary | ICD-10-CM | POA: Insufficient documentation

## 2017-10-29 DIAGNOSIS — R2242 Localized swelling, mass and lump, left lower limb: Secondary | ICD-10-CM | POA: Diagnosis not present

## 2017-10-29 DIAGNOSIS — Z79899 Other long term (current) drug therapy: Secondary | ICD-10-CM | POA: Diagnosis not present

## 2017-10-29 DIAGNOSIS — O99713 Diseases of the skin and subcutaneous tissue complicating pregnancy, third trimester: Secondary | ICD-10-CM

## 2017-10-29 DIAGNOSIS — M79605 Pain in left leg: Secondary | ICD-10-CM

## 2017-10-29 DIAGNOSIS — O0993 Supervision of high risk pregnancy, unspecified, third trimester: Secondary | ICD-10-CM

## 2017-10-29 DIAGNOSIS — L299 Pruritus, unspecified: Secondary | ICD-10-CM

## 2017-10-29 DIAGNOSIS — O9989 Other specified diseases and conditions complicating pregnancy, childbirth and the puerperium: Secondary | ICD-10-CM | POA: Insufficient documentation

## 2017-10-29 DIAGNOSIS — Z87891 Personal history of nicotine dependence: Secondary | ICD-10-CM | POA: Insufficient documentation

## 2017-10-29 DIAGNOSIS — Z7984 Long term (current) use of oral hypoglycemic drugs: Secondary | ICD-10-CM | POA: Insufficient documentation

## 2017-10-29 LAB — FETAL NONSTRESS TEST

## 2017-10-29 LAB — POCT URINALYSIS DIP (DEVICE)
Bilirubin Urine: NEGATIVE
Glucose, UA: 500 mg/dL — AB
Hgb urine dipstick: NEGATIVE
Leukocytes, UA: NEGATIVE
NITRITE: NEGATIVE
PROTEIN: 30 mg/dL — AB
Specific Gravity, Urine: >=1.03 (ref 1.005–1.030)
UROBILINOGEN UA: 2 mg/dL — AB (ref 0.0–1.0)
pH: 6 (ref 5.0–8.0)

## 2017-10-29 LAB — URINALYSIS, ROUTINE W REFLEX MICROSCOPIC
Bilirubin Urine: NEGATIVE
Glucose, UA: 250 mg/dL — AB
Hgb urine dipstick: NEGATIVE
KETONES UR: NEGATIVE mg/dL
Leukocytes, UA: NEGATIVE
NITRITE: NEGATIVE
PH: 6.5 (ref 5.0–8.0)
PROTEIN: NEGATIVE mg/dL
Specific Gravity, Urine: 1.025 (ref 1.005–1.030)

## 2017-10-29 MED ORDER — FERROUS SULFATE 325 (65 FE) MG PO TABS: 325.0000 mg | ORAL_TABLET | Freq: Two times a day (BID) | ORAL | 1 refills | Status: DC

## 2017-10-29 MED ORDER — TRIAMCINOLONE ACETONIDE 0.1 % EX OINT: 1.0000 "application " | TOPICAL_OINTMENT | Freq: Two times a day (BID) | CUTANEOUS | 0 refills | Status: DC

## 2017-10-29 NOTE — Progress Notes (Signed)
Pt informed that the ultrasound is considered a limited OB ultrasound and is not intended to be a complete ultrasound exam.  Patient also informed that the ultrasound is not being completed with the intent of assessing for fetal or placental anomalies or any pelvic abnormalities.  Explained that the purpose of today's ultrasound is to assess for .  Patient acknowledges the purpose of the exam and the limitations of the study.

## 2017-10-29 NOTE — ED Provider Notes (Signed)
[facility] EMERGENCY DEPARTMENT Provider Note   CSN: [id number] Arrival date & time: 10/29/17  1624     History   Chief Complaint Chief Complaint  Patient presents with  . Leg Pain    HPI Jodi Mcgee is a 32 y.o. female G3P2 is currently [redacted] weeks pregnant who presents for evaluation of grossly worsening right lower extremity pain and swelling.  Patient reports that is been intermittent for the last several weeks.  She reports that over the last few days, the symptoms have become more severe and frequent.  She states her last 48 hours, the pain has been more constant.  She feels like the right lower extremity has worsening swelling than the left leg.  She denies any warmth or erythema noted.  She does report pain to the posterior aspect of her calf.  Ports that she has had some intermittent numbness.  She denies any preceding trauma, injury, fall.  Patient denies any chest pain or difficulty breathing.  He denies any preceding trauma, injury, fall.  The history is provided by the patient.    Past Medical History:  Diagnosis Date  . Anemia   . Anxiety   . Diabetes mellitus without complication ([place])   . GERD (gastroesophageal reflux disease)    has resolved  . IBS (irritable bowel syndrome)   . Ovarian cyst   . Peritonitis, acute generalized ([place])   . Sepsis [facility])     Patient Active Problem List   Diagnosis Date Noted  . Pelvic adhesive disease 10/22/2017  . LGA (large for gestational age) fetus affecting management of mother 10/22/2017  . Anemia affecting pregnancy in third trimester 10/11/2017  . Obesity (BMI 30-39.9) 09/25/2017  . High blood pressure 09/13/2017  . Unwanted fertility 08/30/2017  . Previous cesarean section complicating pregnancy 62/69/4854  . Supervision of high risk pregnancy, antepartum, third trimester 08/23/2017  . Insufficient prenatal care 08/23/2017  . Type 2 diabetes mellitus complicating pregnancy, antepartum 08/23/2017  .  Chronic pain syndrome 08/23/2017    Past Surgical History:  Procedure Laterality Date  . APPENDECTOMY     ruptured   . CESAREAN SECTION    . OVARIAN CYST DRAINAGE    . SMALL BOWEL REPAIR      OB History    Gravida Para Term Preterm AB Living   '3 2 1 1 ' 0 2   SAB TAB Ectopic Multiple Live Births   0 0 0   2       Home Medications    Prior to Admission medications   Medication Sig Start Date End Date Taking? Authorizing Provider  aspirin EC 81 MG tablet Take 1 tablet (81 mg total) by mouth daily. Take after 12 weeks for prevention of preeclampsia later in pregnancy Patient not taking: Reported on 10/29/2017 08/23/17   Constant, Peggy, MD  Blood Glucose Monitoring Suppl (ACCU-CHEK NANO SMARTVIEW) w/Device KIT 1 kit by Subdermal route as directed. Check blood sugars for fasting, and two hours after breakfast, lunch and dinner (4 checks daily) 08/23/17   Constant, Peggy, MD  cyclobenzaprine (FLEXERIL) 5 MG tablet Take 1 tablet (5 mg total) by mouth 3 (three) times daily as needed for muscle spasms. 10/17/17   Jorje Guild, NP  Elastic Bandages & Supports (COMFORT FIT MATERNITY SUPP LG) MISC 1 Units by Does not apply route daily. Patient not taking: Reported on 10/22/2017 10/17/17   Jorje Guild, NP  famotidine (PEPCID) 20 MG tablet Take 1 tablet (20 mg total) by mouth  2 (two) times daily. 10/11/17   Tamala Julian, [place], CNM  ferrous sulfate (FERROUSUL) 325 (65 FE) MG tablet Take 1 tablet (325 mg total) by mouth 2 (two) times daily. 10/29/17   Truett Mainland, DO  glucose blood (ACCU-CHEK SMARTVIEW) test strip Use as instructed to check blood sugars 08/23/17   Constant, Peggy, MD  glyBURIDE (DIABETA) 2.5 MG tablet Take 1 tablet (2.5 mg total) by mouth 2 (two) times daily with a meal. 10/11/17   Tamala Julian, [place], CNM  lurasidone (LATUDA) 80 MG TABS tablet Take 80 mg by mouth 2 (two) times daily.    [provider]  metFORMIN (GLUCOPHAGE) 1000 MG tablet Take 1 tablet (1,000 mg total) by  mouth 2 (two) times daily with a meal. 08/23/17   Constant, Peggy, MD  ondansetron (ZOFRAN ODT) 4 MG disintegrating tablet Take 1 tablet (4 mg total) by mouth every 8 (eight) hours as needed for nausea or vomiting. 10/17/17   Jorje Guild, NP  Prenat-Fe Poly-Methfol-FA-DHA (VITAFOL ULTRA) 29-0.6-0.4-200 MG CAPS Take 1 tablet by mouth daily. 08/23/17   Constant, Peggy, MD  triamcinolone ointment (KENALOG) 0.1 % Apply 1 application topically 2 (two) times daily. 10/29/17   Truett Mainland, DO    Family History No family history on file.  Social History Social History   Tobacco Use  . Smoking status: Former Smoker    Packs/day: 0.50    Types: Cigarettes  . Smokeless tobacco: Never Used  Substance Use Topics  . Alcohol use: No  . Drug use: No     Allergies   Cetirizine & related; Contrast media [iodinated diagnostic agents]; Nsaids; Toradol [ketorolac tromethamine]; Contrast media [iodinated diagnostic agents]; and Nsaids   Review of Systems Review of Systems  Constitutional: Negative for chills and fever.  HENT: Negative for congestion.   Eyes: Negative for visual disturbance.  Respiratory: Negative for cough and shortness of breath.   Cardiovascular: Positive for leg swelling. Negative for chest pain.  Gastrointestinal: Negative for abdominal pain, diarrhea, nausea and vomiting.  Genitourinary: Negative for dysuria and hematuria.  Musculoskeletal: Negative for back pain and neck pain.  Skin: Negative for rash.  Neurological: Positive for numbness. Negative for dizziness, weakness and headaches.  Psychiatric/Behavioral: Negative for confusion.     Physical Exam Updated Vital Signs BP 122/67   Pulse (!) 105   Temp 97.9 F (36.6 C) (Oral)   Resp 18   Ht '5\' 6"'  (1.676 m)   Wt 113.9 kg (251 lb)   LMP 03/10/2017   SpO2 100%   BMI 40.51 kg/m   Physical Exam  Constitutional: She is oriented to person, place, and time. She appears well-developed and well-nourished.    HENT:  Head: Normocephalic and atraumatic.  Mouth/Throat: Oropharynx is clear and moist and mucous membranes are normal.  Eyes: Conjunctivae, EOM and lids are normal. Pupils are equal, round, and reactive to light.  Neck: Full passive range of motion without pain.  Cardiovascular: Normal rate, regular rhythm, normal heart sounds and normal pulses. Exam reveals no gallop and no friction rub.  No murmur heard. Pulses:      Dorsalis pedis pulses are 2+ on the right side, and 2+ on the left side.  Pulmonary/Chest: Effort normal and breath sounds normal.  Abdominal: There is no tenderness. There is no rigidity and no guarding.  Gravid abdomen.  No tenderness palpation.   Musculoskeletal: Normal range of motion.  Diffuse tendernesspalpation the posterior aspect of the right calf.  There is some minimal swelling that  is worse in the left lower extremity.  No overlying warmth, erythema noted.  Dorsiflexion plantarflexion of right foot intact without difficulty.  Able to move all 5 digits of right foot without any difficulty.  No deformity or crepitus noted.  Neurological: She is alert and oriented to person, place, and time.  Skin: Skin is warm and dry. Capillary refill takes less than 2 seconds.  RLE is not dusky in appearance or cool to touch. Good distal cap refill.   Psychiatric: She has a normal mood and affect. Her speech is normal.  Nursing note and vitals reviewed.    ED Treatments / Results  Labs (all labs ordered are listed, but only abnormal results are displayed) Labs Reviewed  URINALYSIS, ROUTINE W REFLEX MICROSCOPIC - Abnormal; Notable for the following components:      Result Value   APPearance CLOUDY (*)    Glucose, UA 250 (*)    All other components within normal limits    EKG  EKG Interpretation None       Radiology US Venous Img Lower Unilateral Right  Result Date: 10/29/2017 CLINICAL DATA:  Right calf pain for 1 week.  [redacted] weeks pregnant. EXAM: RIGHT LOWER  EXTREMITY VENOUS DOPPLER ULTRASOUND TECHNIQUE: Gray-scale sonography with graded compression, as well as color Doppler and duplex ultrasound were performed to evaluate the lower extremity deep venous systems from the level of the common femoral vein and including the common femoral, femoral, profunda femoral, popliteal and calf veins including the posterior tibial, peroneal and gastrocnemius veins when visible. The superficial great saphenous vein was also interrogated. Spectral Doppler was utilized to evaluate flow at rest and with distal augmentation maneuvers in the common femoral, femoral and popliteal veins. COMPARISON:  None. FINDINGS: Contralateral Common Femoral Vein: Respiratory phasicity is normal and symmetric with the symptomatic side. No evidence of thrombus. Normal compressibility. Common Femoral Vein: No evidence of thrombus. Normal compressibility, respiratory phasicity and response to augmentation. Saphenofemoral Junction: No evidence of thrombus. Normal compressibility and flow on color Doppler imaging. Profunda Femoral Vein: No evidence of thrombus. Normal compressibility and flow on color Doppler imaging. Femoral Vein: No evidence of thrombus. Normal compressibility, respiratory phasicity and response to augmentation. Popliteal Vein: No evidence of thrombus. Normal compressibility, respiratory phasicity and response to augmentation. Calf Veins: No evidence of thrombus. Normal compressibility and flow on color Doppler imaging. Superficial Great Saphenous Vein: No evidence of thrombus. Normal compressibility. IMPRESSION: Negative for deep venous thrombosis in right lower extremity. Electronically Signed   By: Markus Daft M.D.   On: 10/29/2017 18:52   US Fetal Bpp W/nonstress  Result Date: 10/29/2017 ----------------------------------------------------------------------  OBSTETRICS REPORT                      (Signed Final 10/29/2017 02:28 pm)  ---------------------------------------------------------------------- Patient Info  ID #:       [id number]                          D.O.B.:  Jul 04, 1986 (31 yrs)  Name:       Jodi Mcgee               Visit Date: 10/29/2017 11:59 am ---------------------------------------------------------------------- Performed By  Performed By:     Shauna Hugh Day RNC          Ref. Address:     Faculty  Attending:        Loma Boston DO  Location[facility]         [facility]                                                             [facility]  Referred By:      Vickii Chafe                    CONSTANT MD ---------------------------------------------------------------------- Orders   #  Description                                 Code   1  US FETAL BPP W/NONSTRESS                    34917.9  ----------------------------------------------------------------------   #  Ordered By               Order #        Accession #    Episode #   1  Loma Boston            [id number]      [id number]     [id number]  ---------------------------------------------------------------------- Service(s) Provided   US Fetal BPP W NST                                   [phone]  ---------------------------------------------------------------------- Indications   [redacted] weeks gestation of pregnancy                Z3A.33   Poor obstetric history: Previous IUFD          O09.299   (stillbirth)   Umbilical vein abnormality complicating        E07.9XX0   pregnancy  ---------------------------------------------------------------------- Fetal Evaluation  Num Of Fetuses:     1  Preg. Location:     Intrauterine  Cardiac Activity:   Observed  Presentation:       Cephalic  Amniotic Fluid  AFI FV:      Polyhydramnios  AFI Sum(cm)     %Tile       Largest Pocket(cm)  26.51           96          10.3  RUQ(cm)       RLQ(cm)       LUQ(cm)        LLQ(cm)  10.3          3.99          8.2            4.02  ---------------------------------------------------------------------- Biophysical Evaluation  Amniotic F.V:   Pocket => 2 cm two         F. Tone:        Observed  planes  F. Movement:    Observed                   N.S.T:          Reactive  F. Breathing:   Observed                   Score:          10/10 ---------------------------------------------------------------------- Gestational Age  LMP:           33w 2d        Date:  03/10/17                 EDD:   12/15/17  Best:          33w 2d     Det. By:  LMP  (03/10/17)          EDD:   12/15/17 ---------------------------------------------------------------------- Impression  Polyhydramnios  BPP 10/10 with reactive NST ---------------------------------------------------------------------- Recommendations  Continue antenatal testing ----------------------------------------------------------------------                   Loma Boston, DO Electronically Signed Final Report   10/29/2017 02:28 pm ----------------------------------------------------------------------   Procedures Procedures (including critical care time)  Medications Ordered in ED Medications - No data to display   Initial Impression / Assessment and Plan / ED Course  I have reviewed the triage vital signs and the nursing notes.  Pertinent labs & imaging results that were available during my care of the patient were reviewed by me and considered in my medical decision making (see chart for details).     32 y.o. G3P2 who is currently [redacted] weeks pregnant who presents for evaluation of right lower extremity pain.  Patient reports that pain has been ongoing for the last few weeks but has worsened over the last few days.  Patient also has noticed some worsening swelling to the right lower extremity.  She states that she has swelling in both extremities but notes that the right feels like it is slightly worse.  She has been able to ambulate and bear weight without difficulty.  Had  some intermittent numbness but currently denies any numbness at this time.  No warmth, erythema. Patient is afebrile, non-toxic appearing, sitting comfortably on examination table.  Patient is neurovascularly intact. Initial vitals reviewed.  Patient was hypertensive.  She denies any comp locations with hypertension throughout her pregnancy.  She denies a history of preeclampsia.  We will repeat vitals and obtain a urine.  Consider muscular skeletal pain versus pain associated with pregnancy.  Also consider DVT.  History/physical exam are not concerning for cellulitis, acute arterial embolism.  Ultrasound of right lower extremity obtained at triage.  Percent reviewed.  No evidence of DVT.  Repeat vitals show improvement in blood pressure.  UA reviewed.  Negative for any protein. Negative for any acute signs of infection. Discussed patient with Dr. Ralene Bathe who independently evaluated patient.  Patient stable for discharge at this time.  Patient instructed to follow-up with her OB/GYN in the next 24-48 hours for further evaluation. Patient had ample opportunity for questions and discussion. All patient's questions were answered with full understanding. Strict return precautions discussed. Patient expresses understanding and agreement to plan.   Final Clinical Impressions(s) / ED Diagnoses   Final diagnoses:  Pain of right lower extremity  Left leg pain    ED Discharge Orders    None       Volanda Napoleon, PA-C 10/30/17 [id number]  Quintella Reichert, MD 10/30/17 1807

## 2017-10-29 NOTE — Progress Notes (Signed)
Pt states she is having muscle cramps in her lower legs every night and has residual soreness and swelling of RLE. She also reports having dizzy spells and feels she is going to faint. Pt states she has never taken ASA 81 mg daily and requests refill of Kenalog cream.

## 2017-10-29 NOTE — ED Notes (Signed)
ED Provider at bedside. 

## 2017-10-29 NOTE — ED Notes (Signed)
Patient transported to Ultrasound 

## 2017-10-29 NOTE — ED Notes (Signed)
Pt in US

## 2017-10-29 NOTE — ED Triage Notes (Signed)
C/o pain, swelling to right calf x 1 week-was seen at [facility] today for preg-[redacted] weeks pregnant-was advised to come for leg pain evaluation

## 2017-10-29 NOTE — Discharge Instructions (Signed)
Take Tylenol as directed.  Follow-up with your OB/GYN in the next 24-40 hours for further evaluation.  Return to the department for any worsening pain, chest pain, difficulty breathing, numbness or weakness of the leg or any other worsening or concerning symptoms.

## 2017-10-29 NOTE — Progress Notes (Signed)
Subjective:  Jodi Mcgee is a 32 y.o. [phone] at [redacted]w[redacted]d being seen today for ongoing prenatal care.  She is currently monitored for the following issues for this high-risk pregnancy and has Previous cesarean section complicating pregnancy; Supervision of high risk pregnancy, antepartum, third trimester; Insufficient prenatal care; Type 2 diabetes mellitus complicating pregnancy, antepartum; Chronic pain syndrome; Unwanted fertility; High blood pressure; Obesity (BMI 30-39.9); Anemia affecting pregnancy in third trimester; Pelvic adhesive disease; and LGA (large for gestational age) fetus affecting management of mother on their problem list.  GDM: Patient taking Metformin, glyburide.  Reports no hypoglycemic episodes.  Tolerating medication well. Did not bring log Fasting: 96 2hr PP: 130s-140s  Patient reports no complaints.  Contractions: Not present. Vag. Bleeding: None.  Movement: Present. Denies leaking of fluid.   The following portions of the patient's history were reviewed and updated as appropriate: allergies, current medications, past family history, past medical history, past social history, past surgical history and problem list. Problem list updated.  Objective:   Vitals:   10/29/17 0929  BP: (!) 125/56  Pulse: (!) 102  Weight: 248 lb 14.4 oz (112.9 kg)    Fetal Status: Fetal Heart Rate (bpm): NST   Movement: Present     General:  Alert, oriented and cooperative. Patient is in no acute distress.  Skin: Skin is warm and dry. No rash noted.   Cardiovascular: Normal heart rate noted  Respiratory: Normal respiratory effort, no problems with respiration noted  Abdomen: Soft, gravid, appropriate for gestational age. Pain/Pressure: Present     Pelvic: Vag. Bleeding: None     Cervical exam deferred        Extremities: Normal range of motion.     Mental Status: Normal mood and affect. Normal behavior. Normal judgment and thought content.   Urinalysis:      Assessment and  Plan:  Pregnancy: G3P1102 at [redacted]w[redacted]d  1. Supervision of high risk pregnancy in third trimester FHT normal  2. Type 2 diabetes mellitus complicating pregnancy, antepartum BPP 8/8. NST reactive F/u US  For growth on 1/28 Patient emotionally eating due to death of close friend/family.  Return to diet to see if this improves.   3. Previous cesarean section complicating pregnancy Will schedule repeat  4. Pruritus of pregnancy in third trimester Bile acid salt normal.  triamcinolone ointment (KENALOG) 0.1 %; Apply 1 application topically 2 (two) times daily.  Dispense: 30 g; Refill: 0  Preterm labor symptoms and general obstetric precautions including but not limited to vaginal bleeding, contractions, leaking of fluid and fetal movement were reviewed in detail with the patient. Please refer to After Visit Summary for other counseling recommendations.  Return in about 1 week (around 11/05/2017) for NST/BPP and HOB.   Truett Mainland, DO

## 2017-10-30 ENCOUNTER — Encounter: Payer: Self-pay | Admitting: *Deleted

## 2017-10-31 ENCOUNTER — Encounter (HOSPITAL_COMMUNITY): Payer: Self-pay

## 2017-11-02 ENCOUNTER — Inpatient Hospital Stay (HOSPITAL_COMMUNITY)
Admission: AD | Admit: 2017-11-02 | Discharge: 2017-11-02 | Disposition: A | Discharge status: Home / Self Care | Payer: Medicaid Other | Source: Ambulatory Visit | Attending: Obstetrics and Gynecology | Admitting: Obstetrics and Gynecology

## 2017-11-02 ENCOUNTER — Encounter (HOSPITAL_COMMUNITY): Payer: Self-pay

## 2017-11-02 DIAGNOSIS — R102 Pelvic and perineal pain: Secondary | ICD-10-CM | POA: Diagnosis not present

## 2017-11-02 DIAGNOSIS — O403XX0 Polyhydramnios, third trimester, not applicable or unspecified: Secondary | ICD-10-CM | POA: Diagnosis not present

## 2017-11-02 DIAGNOSIS — R110 Nausea: Secondary | ICD-10-CM | POA: Insufficient documentation

## 2017-11-02 DIAGNOSIS — O99343 Other mental disorders complicating pregnancy, third trimester: Secondary | ICD-10-CM | POA: Diagnosis not present

## 2017-11-02 DIAGNOSIS — Z7982 Long term (current) use of aspirin: Secondary | ICD-10-CM | POA: Diagnosis not present

## 2017-11-02 DIAGNOSIS — O26893 Other specified pregnancy related conditions, third trimester: Secondary | ICD-10-CM | POA: Diagnosis not present

## 2017-11-02 DIAGNOSIS — R12 Heartburn: Secondary | ICD-10-CM

## 2017-11-02 DIAGNOSIS — Z7984 Long term (current) use of oral hypoglycemic drugs: Secondary | ICD-10-CM | POA: Diagnosis not present

## 2017-11-02 DIAGNOSIS — Z79899 Other long term (current) drug therapy: Secondary | ICD-10-CM | POA: Insufficient documentation

## 2017-11-02 DIAGNOSIS — Z9889 Other specified postprocedural states: Secondary | ICD-10-CM | POA: Diagnosis not present

## 2017-11-02 DIAGNOSIS — E119 Type 2 diabetes mellitus without complications: Secondary | ICD-10-CM | POA: Insufficient documentation

## 2017-11-02 DIAGNOSIS — O3483 Maternal care for other abnormalities of pelvic organs, third trimester: Secondary | ICD-10-CM | POA: Diagnosis not present

## 2017-11-02 DIAGNOSIS — Z91041 Radiographic dye allergy status: Secondary | ICD-10-CM | POA: Insufficient documentation

## 2017-11-02 DIAGNOSIS — Z886 Allergy status to analgesic agent status: Secondary | ICD-10-CM | POA: Diagnosis not present

## 2017-11-02 DIAGNOSIS — Z888 Allergy status to other drugs, medicaments and biological substances status: Secondary | ICD-10-CM | POA: Diagnosis not present

## 2017-11-02 DIAGNOSIS — Z87891 Personal history of nicotine dependence: Secondary | ICD-10-CM | POA: Diagnosis not present

## 2017-11-02 DIAGNOSIS — K219 Gastro-esophageal reflux disease without esophagitis: Secondary | ICD-10-CM | POA: Diagnosis not present

## 2017-11-02 DIAGNOSIS — O24113 Pre-existing diabetes mellitus, type 2, in pregnancy, third trimester: Secondary | ICD-10-CM | POA: Insufficient documentation

## 2017-11-02 DIAGNOSIS — O99613 Diseases of the digestive system complicating pregnancy, third trimester: Secondary | ICD-10-CM | POA: Diagnosis not present

## 2017-11-02 DIAGNOSIS — O34219 Maternal care for unspecified type scar from previous cesarean delivery: Secondary | ICD-10-CM | POA: Diagnosis not present

## 2017-11-02 DIAGNOSIS — Z885 Allergy status to narcotic agent status: Secondary | ICD-10-CM | POA: Diagnosis not present

## 2017-11-02 DIAGNOSIS — K589 Irritable bowel syndrome without diarrhea: Secondary | ICD-10-CM | POA: Diagnosis not present

## 2017-11-02 DIAGNOSIS — Z3A33 33 weeks gestation of pregnancy: Secondary | ICD-10-CM | POA: Insufficient documentation

## 2017-11-02 MED ORDER — GI COCKTAIL ~~LOC~~
30.0000 mL | Freq: Once | ORAL | Status: AC
  Administered 2017-11-02: 30 mL via ORAL
  Filled 2017-11-02: qty 30

## 2017-11-02 MED ORDER — PANTOPRAZOLE SODIUM 20 MG PO TBEC: 20.0000 mg | DELAYED_RELEASE_TABLET | Freq: Two times a day (BID) | ORAL | 1 refills | Status: DC

## 2017-11-02 MED ORDER — CYCLOBENZAPRINE HCL 5 MG PO TABS: 5.0000 mg | ORAL_TABLET | Freq: Three times a day (TID) | ORAL | 0 refills | Status: DC | PRN

## 2017-11-02 MED ORDER — ONDANSETRON 8 MG PO TBDP: 8.0000 mg | ORAL_TABLET | Freq: Three times a day (TID) | ORAL | 1 refills | Status: DC | PRN

## 2017-11-02 NOTE — MAU Note (Signed)
Urine in lab 

## 2017-11-02 NOTE — MAU Provider Note (Signed)
History     CSN: [id number]  Arrival date and time: 11/02/17 1601   First Provider Initiated Contact with Patient 11/02/17 1639     Chief Complaint  Patient presents with  . Heartburn  . Vaginal Pain   HPI  Jodi Mcgee is a 32 y.o. [id number] at 30w6dwho presents with acid reflux and vaginal pain. She reports the medicine she is taking is not helping and she feels constant burning in her throat. She states it makes it hard to eat and causes constant nausea. She reports constant vaginal pressure. She states [place]  OB History    Gravida Para Term Preterm AB Living   _0 0 2   SAB TAB Ectopic Multiple Live Births   0 0 0   2      Past Medical History:  Diagnosis Date  . Anemia   . Anxiety   . Diabetes mellitus without complication ([place]   . GERD (gastroesophageal reflux disease)    has resolved  . IBS (irritable bowel syndrome)   . Ovarian cyst   . Peritonitis, acute generalized ([place]   . Sepsis ([facility]     Past Surgical History:  Procedure Laterality Date  . APPENDECTOMY     ruptured   . CESAREAN SECTION    . OVARIAN CYST DRAINAGE    . SMALL BOWEL REPAIR      History reviewed. No pertinent family history.  Social History   Tobacco Use  . Smoking status: Former Smoker    Packs/day: 0.50    Types: Cigarettes  . Smokeless tobacco: Never Used  Substance Use Topics  . Alcohol use: No  . Drug use: No    Allergies:  Allergies  Allergen Reactions  . Cetirizine & Related   . Contrast Media [Iodinated Diagnostic Agents] Itching  . Nsaids Other (See Comments)    None per gi  . Toradol [Ketorolac Tromethamine] Hives and Itching  . Contrast Media [Iodinated Diagnostic Agents] Itching    Mild itching after IV contrast on 6/1/7 No urticaria visible No wheezing No angioedema  . Nsaids Rash    Medications Prior to Admission  Medication Sig Dispense Refill Last Dose  . aspirin EC 81  MG tablet Take 1 tablet (81 mg total) by mouth daily. Take after 12 weeks for prevention of preeclampsia later in pregnancy (Patient not taking: Reported on 10/29/2017) 300 tablet 2 Not Taking  . Blood Glucose Monitoring Suppl (ACCU-CHEK NANO SMARTVIEW) w/Device KIT 1 kit by Subdermal route as directed. Check blood sugars for fasting, and two hours after breakfast, lunch and dinner (4 checks daily) 1 kit 0 Taking  . cyclobenzaprine (FLEXERIL) 5 MG tablet Take 1 tablet (5 mg total) by mouth 3 (three) times daily as needed for muscle spasms. 10 tablet 0 Taking  . Elastic Bandages & Supports (COMFORT FIT MATERNITY SUPP LG) MISC 1 Units by Does not apply route daily. (Patient not taking: Reported on 10/22/2017) 1 each 0 Not Taking  . famotidine (PEPCID) 20 MG tablet Take 1 tablet (20 mg total) by mouth 2 (two) times daily. 60 tablet 3 Taking  . ferrous sulfate (FERROUSUL) 325 (65 FE) MG tablet Take 1 tablet (325 mg total) by mouth 2 (two) times daily. 60 tablet 1   . glucose blood (ACCU-CHEK SMARTVIEW) test strip Use as instructed to check blood sugars 100 each 12 Taking  . glyBURIDE (DIABETA) 2.5 MG tablet  Take 1 tablet (2.5 mg total) by mouth 2 (two) times daily with a meal. 60 tablet 3 Taking  . lurasidone (LATUDA) 80 MG TABS tablet Take 80 mg by mouth 2 (two) times daily.   Not Taking  . metFORMIN (GLUCOPHAGE) 1000 MG tablet Take 1 tablet (1,000 mg total) by mouth 2 (two) times daily with a meal. 60 tablet 3 Taking  . ondansetron (ZOFRAN ODT) 4 MG disintegrating tablet Take 1 tablet (4 mg total) by mouth every 8 (eight) hours as needed for nausea or vomiting. 15 tablet 0 Taking  . Prenat-Fe Poly-Methfol-FA-DHA (VITAFOL ULTRA) 29-0.6-0.4-200 MG CAPS Take 1 tablet by mouth daily. 30 capsule 12 Taking  . triamcinolone ointment (KENALOG) 0.1 % Apply 1 application topically 2 (two) times daily. 30 g 0     Review of Systems  Constitutional: Negative.  Negative for fatigue and fever.  HENT: Positive for sore  throat.   Respiratory: Negative.  Negative for shortness of breath.   Cardiovascular: Negative.  Negative for chest pain.  Gastrointestinal: Positive for nausea. Negative for abdominal pain, constipation, diarrhea and vomiting.  Genitourinary: Positive for vaginal pain. Negative for dysuria.  Neurological: Negative.  Negative for dizziness and headaches.   Physical Exam   Blood pressure 121/73, pulse (!) 106, temperature 98.2 F (36.8 C), temperature source Oral, resp. rate 18, height _0  (1.676 m), weight 253 lb (114.8 kg), last menstrual period 03/10/2017, SpO2 98 %.  Physical Exam  Nursing note and vitals reviewed. Constitutional: She is oriented to person, place, and time. She appears well-developed and well-nourished. No distress.  HENT:  Head: Normocephalic.  Eyes: Pupils are equal, round, and reactive to light.  Cardiovascular: Normal rate, regular rhythm and normal heart sounds.  Respiratory: Effort normal and breath sounds normal. No respiratory distress.  GI: Soft. Bowel sounds are normal. She exhibits no distension. There is no tenderness.  Neurological: She is alert and oriented to person, place, and time.  Skin: Skin is warm and dry.  Psychiatric: She has a normal mood and affect. Her behavior is normal. Judgment and thought content normal.   Fetal Tracing:  Baseline: 150 Variability: moderate Accels: 15x15 Decels: none  Toco: none  Dilation: Closed Effacement (%): Thick Exam by:: Haynes Bast CNM    MAU Course  Procedures  MDM GI cocktail NST-reactive  Assessment and Plan   1. Heartburn during pregnancy in third trimester   2. Polyhydramnios in third trimester complication, single or unspecified fetus   3. Vaginal pain    -Discharge home in stable condition -Rx for protonix and zofran sent to patient's pharmacy -Preterm labor precautions discussed -Patient advised to follow-up with [facility] as scheduled for prenatal  care -Patient may return to MAU as needed or if her condition were to change or worsen  Wende Mott CNM 11/02/2017, 4:57 PM   Allergies as of 11/02/2017      Reactions   Cetirizine & Related    Contrast Media [iodinated Diagnostic Agents] Itching   Nsaids Other (See Comments)   None per gi   Toradol [ketorolac Tromethamine] Hives, Itching   Contrast Media [iodinated Diagnostic Agents] Itching   Mild itching after IV contrast on 6/1/7 No urticaria visible No wheezing No angioedema   Nsaids Rash      Medication List    STOP taking these medications   aspirin EC 81 MG tablet   famotidine 20 MG tablet Commonly known as:  PEPCID     TAKE these medications  ACCU-CHEK NANO SMARTVIEW w/Device Kit 1 kit by Subdermal route as directed. Check blood sugars for fasting, and two hours after breakfast, lunch and dinner (4 checks daily)   COMFORT FIT MATERNITY SUPP LG Misc 1 Units by Does not apply route daily.   cyclobenzaprine 5 MG tablet Commonly known as:  FLEXERIL Take 1 tablet (5 mg total) by mouth 3 (three) times daily as needed for muscle spasms.   ferrous sulfate 325 (65 FE) MG tablet Commonly known as:  FERROUSUL Take 1 tablet (325 mg total) by mouth 2 (two) times daily.   glucose blood test strip Commonly known as:  ACCU-CHEK SMARTVIEW Use as instructed to check blood sugars   glyBURIDE 2.5 MG tablet Commonly known as:  DIABETA Take 1 tablet (2.5 mg total) by mouth 2 (two) times daily with a meal.   lurasidone 80 MG Tabs tablet Commonly known as:  LATUDA Take 80 mg by mouth 2 (two) times daily.   metFORMIN 1000 MG tablet Commonly known as:  GLUCOPHAGE Take 1 tablet (1,000 mg total) by mouth 2 (two) times daily with a meal.   ondansetron 8 MG disintegrating tablet Commonly known as:  ZOFRAN ODT Take 1 tablet (8 mg total) by mouth every 8 (eight) hours as needed for nausea or vomiting. What changed:    medication strength  how much to take    pantoprazole 20 MG tablet Commonly known as:  PROTONIX Take 1 tablet (20 mg total) by mouth 2 (two) times daily.   triamcinolone ointment 0.1 % Commonly known as:  KENALOG Apply 1 application topically 2 (two) times daily.   VITAFOL ULTRA 29-0.6-0.4-200 MG Caps Take 1 tablet by mouth daily.

## 2017-11-02 NOTE — MAU Note (Signed)
Patient presents with increasing acid reflux this past week, has been taking Pepcid not helping, can't eat or drink, having vaginal pain and pressuer.

## 2017-11-02 NOTE — Discharge Instructions (Signed)
Gastroesophageal Reflux Disease, Adult Normally, food travels down the esophagus and stays in the stomach to be digested. However, when a person has gastroesophageal reflux disease (GERD), food and stomach acid move back up into the esophagus. When this happens, the esophagus becomes sore and inflamed. Over time, GERD can create small holes (ulcers) in the lining of the esophagus. What are the causes? This condition is caused by a problem with the muscle between the esophagus and the stomach (lower esophageal sphincter, or LES). Normally, the LES muscle closes after food passes through the esophagus to the stomach. When the LES is weakened or abnormal, it does not close properly, and that allows food and stomach acid to go back up into the esophagus. The LES can be weakened by certain dietary substances, medicines, and medical conditions, including:  Tobacco use.  Pregnancy.  Having a hiatal hernia.  Heavy alcohol use.  Certain foods and beverages, such as coffee, chocolate, onions, and peppermint.  What increases the risk? This condition is more likely to develop in:  People who have an increased body weight.  People who have connective tissue disorders.  People who use NSAID medicines.  What are the signs or symptoms? Symptoms of this condition include:  Heartburn.  Difficult or painful swallowing.  The feeling of having a lump in the throat.  Abitter taste in the mouth.  Bad breath.  Having a large amount of saliva.  Having an upset or bloated stomach.  Belching.  Chest pain.  Shortness of breath or wheezing.  Ongoing (chronic) cough or a night-time cough.  Wearing away of tooth enamel.  Weight loss.  Different conditions can cause chest pain. Make sure to see your health care provider if you experience chest pain. How is this diagnosed? Your health care provider will take a medical history and perform a physical exam. To determine if you have mild or severe  GERD, your health care provider may also monitor how you respond to treatment. You may also have other tests, including:  An endoscopy toexamine your stomach and esophagus with a small camera.  A test thatmeasures the acidity level in your esophagus.  A test thatmeasures how much pressure is on your esophagus.  A barium swallow or modified barium swallow to show the shape, size, and functioning of your esophagus.  How is this treated? The goal of treatment is to help relieve your symptoms and to prevent complications. Treatment for this condition may vary depending on how severe your symptoms are. Your health care provider may recommend:  Changes to your diet.  Medicine.  Surgery.  Follow these instructions at home: Diet  Follow a diet as recommended by your health care provider. This may involve avoiding foods and drinks such as: ? Coffee and tea (with or without caffeine). ? Drinks that containalcohol. ? Energy drinks and sports drinks. ? Carbonated drinks or sodas. ? Chocolate and cocoa. ? Peppermint and mint flavorings. ? Garlic and onions. ? Horseradish. ? Spicy and acidic foods, including peppers, chili powder, curry powder, vinegar, hot sauces, and barbecue sauce. ? Citrus fruit juices and citrus fruits, such as oranges, lemons, and limes. ? Tomato-based foods, such as red sauce, chili, salsa, and pizza with red sauce. ? Fried and fatty foods, such as donuts, french fries, potato chips, and high-fat dressings. ? High-fat meats, such as hot dogs and fatty cuts of red and white meats, such as rib eye steak, sausage, ham, and bacon. ? High-fat dairy items, such as whole milk,   butter, and cream cheese.  Eat small, frequent meals instead of large meals.  Avoid drinking large amounts of liquid with your meals.  Avoid eating meals during the 2-3 hours before bedtime.  Avoid lying down right after you eat.  Do not exercise right after you eat. General  instructions  Pay attention to any changes in your symptoms.  Take over-the-counter and prescription medicines only as told by your health care provider. Do not take aspirin, ibuprofen, or other NSAIDs unless your health care provider told you to do so.  Do not use any tobacco products, including cigarettes, chewing tobacco, and e-cigarettes. If you need help quitting, ask your health care provider.  Wear loose-fitting clothing. Do not wear anything tight around your waist that causes pressure on your abdomen.  Raise (elevate) the head of your bed 6 inches (15cm).  Try to reduce your stress, such as with yoga or meditation. If you need help reducing stress, ask your health care provider.  If you are overweight, reduce your weight to an amount that is healthy for you. Ask your health care provider for guidance about a safe weight loss goal.  Keep all follow-up visits as told by your health care provider. This is important. Contact a health care provider if:  You have new symptoms.  You have unexplained weight loss.  You have difficulty swallowing, or it hurts to swallow.  You have wheezing or a persistent cough.  Your symptoms do not improve with treatment.  You have a hoarse voice. Get help right away if:  You have pain in your arms, neck, jaw, teeth, or back.  You feel sweaty, dizzy, or light-headed.  You have chest pain or shortness of breath.  You vomit and your vomit looks like blood or coffee grounds.  You faint.  Your stool is bloody or black.  You cannot swallow, drink, or eat. This information is not intended to replace advice given to you by your health care provider. Make sure you discuss any questions you have with your health care provider. Document Released: 07/19/2005 Document Revised: 03/08/2016 Document Reviewed: 02/03/2015 Elsevier Interactive Patient Education  2018 Elsevier Inc.  

## 2017-11-05 ENCOUNTER — Other Ambulatory Visit: Payer: Self-pay

## 2017-11-05 ENCOUNTER — Inpatient Hospital Stay (HOSPITAL_COMMUNITY)
Admission: AD | Admit: 2017-11-05 | Discharge: 2017-11-08 | DRG: 833 | Disposition: A | Discharge status: Home / Self Care | Payer: Medicaid Other | Source: Ambulatory Visit | Attending: Family Medicine | Admitting: Family Medicine

## 2017-11-05 ENCOUNTER — Ambulatory Visit: Payer: Medicaid Other | Admitting: *Deleted

## 2017-11-05 ENCOUNTER — Encounter (HOSPITAL_COMMUNITY): Payer: Self-pay | Admitting: *Deleted

## 2017-11-05 ENCOUNTER — Ambulatory Visit (INDEPENDENT_AMBULATORY_CARE_PROVIDER_SITE_OTHER): Payer: Medicaid Other | Admitting: Obstetrics and Gynecology

## 2017-11-05 VITALS — BP 111/46 | HR 101 | Wt 252.0 lb

## 2017-11-05 DIAGNOSIS — O34219 Maternal care for unspecified type scar from previous cesarean delivery: Secondary | ICD-10-CM

## 2017-11-05 DIAGNOSIS — O24113 Pre-existing diabetes mellitus, type 2, in pregnancy, third trimester: Secondary | ICD-10-CM | POA: Diagnosis not present

## 2017-11-05 DIAGNOSIS — O99213 Obesity complicating pregnancy, third trimester: Secondary | ICD-10-CM

## 2017-11-05 DIAGNOSIS — O24913 Unspecified diabetes mellitus in pregnancy, third trimester: Secondary | ICD-10-CM | POA: Diagnosis not present

## 2017-11-05 DIAGNOSIS — O24119 Pre-existing diabetes mellitus, type 2, in pregnancy, unspecified trimester: Secondary | ICD-10-CM

## 2017-11-05 DIAGNOSIS — Z7984 Long term (current) use of oral hypoglycemic drugs: Secondary | ICD-10-CM

## 2017-11-05 DIAGNOSIS — O3663X0 Maternal care for excessive fetal growth, third trimester, not applicable or unspecified: Secondary | ICD-10-CM | POA: Diagnosis not present

## 2017-11-05 DIAGNOSIS — Z91199 Patient's noncompliance with other medical treatment and regimen due to unspecified reason: Secondary | ICD-10-CM | POA: Insufficient documentation

## 2017-11-05 DIAGNOSIS — Z9119 Patient's noncompliance with other medical treatment and regimen: Secondary | ICD-10-CM

## 2017-11-05 DIAGNOSIS — Z3A34 34 weeks gestation of pregnancy: Secondary | ICD-10-CM

## 2017-11-05 DIAGNOSIS — Z3A32 32 weeks gestation of pregnancy: Secondary | ICD-10-CM | POA: Diagnosis not present

## 2017-11-05 DIAGNOSIS — O0993 Supervision of high risk pregnancy, unspecified, third trimester: Secondary | ICD-10-CM

## 2017-11-05 DIAGNOSIS — D649 Anemia, unspecified: Secondary | ICD-10-CM

## 2017-11-05 DIAGNOSIS — O09893 Supervision of other high risk pregnancies, third trimester: Secondary | ICD-10-CM | POA: Insufficient documentation

## 2017-11-05 DIAGNOSIS — O3660X0 Maternal care for excessive fetal growth, unspecified trimester, not applicable or unspecified: Secondary | ICD-10-CM | POA: Diagnosis present

## 2017-11-05 DIAGNOSIS — O132 Gestational [pregnancy-induced] hypertension without significant proteinuria, second trimester: Secondary | ICD-10-CM

## 2017-11-05 DIAGNOSIS — O133 Gestational [pregnancy-induced] hypertension without significant proteinuria, third trimester: Secondary | ICD-10-CM

## 2017-11-05 DIAGNOSIS — Z87891 Personal history of nicotine dependence: Secondary | ICD-10-CM

## 2017-11-05 DIAGNOSIS — O24919 Unspecified diabetes mellitus in pregnancy, unspecified trimester: Secondary | ICD-10-CM

## 2017-11-05 DIAGNOSIS — O99013 Anemia complicating pregnancy, third trimester: Secondary | ICD-10-CM

## 2017-11-05 DIAGNOSIS — E119 Type 2 diabetes mellitus without complications: Secondary | ICD-10-CM | POA: Diagnosis present

## 2017-11-05 LAB — CBC
HCT: 26.3 % — ABNORMAL LOW (ref 36.0–46.0)
HEMOGLOBIN: 8.6 g/dL — AB (ref 12.0–15.0)
MCH: 25.8 pg — ABNORMAL LOW (ref 26.0–34.0)
MCHC: 32.7 g/dL (ref 30.0–36.0)
MCV: 79 fL (ref 78.0–100.0)
Platelets: 172 10*3/uL (ref 150–400)
RBC: 3.33 MIL/uL — AB (ref 3.87–5.11)
RDW: 14.5 % (ref 11.5–15.5)
WBC: 5.1 10*3/uL (ref 4.0–10.5)

## 2017-11-05 LAB — GLUCOSE, CAPILLARY: GLUCOSE-CAPILLARY: 249 mg/dL — AB (ref 65–99)

## 2017-11-05 MED ORDER — ACETAMINOPHEN 325 MG PO TABS
650.0000 mg | ORAL_TABLET | ORAL | Status: DC | PRN
  Administered 2017-11-07 – 2017-11-08 (×3): 650 mg via ORAL
  Filled 2017-11-05 (×3): qty 2

## 2017-11-05 MED ORDER — CALCIUM CARBONATE ANTACID 500 MG PO CHEW: 2.0000 | CHEWABLE_TABLET | ORAL | Status: DC | PRN

## 2017-11-05 MED ORDER — TRIAMCINOLONE ACETONIDE 0.1 % EX OINT
1.0000 "application " | TOPICAL_OINTMENT | Freq: Two times a day (BID) | CUTANEOUS | Status: DC | PRN
  Administered 2017-11-06 (×2): 1 via TOPICAL
  Filled 2017-11-05: qty 15

## 2017-11-05 MED ORDER — CYCLOBENZAPRINE HCL 5 MG PO TABS
5.0000 mg | ORAL_TABLET | Freq: Three times a day (TID) | ORAL | Status: DC | PRN
  Administered 2017-11-06 – 2017-11-07 (×3): 5 mg via ORAL
  Filled 2017-11-05 (×4): qty 1

## 2017-11-05 MED ORDER — GLUCOSE BLOOD VI STRP: 1.0000 | ORAL_STRIP | Freq: Four times a day (QID) | 12 refills | Status: DC

## 2017-11-05 MED ORDER — ACCU-CHEK FASTCLIX LANCETS MISC: 1.0000 | Freq: Four times a day (QID) | 12 refills | Status: DC

## 2017-11-05 MED ORDER — FUSION PLUS PO CAPS: 1.0000 | ORAL_CAPSULE | Freq: Every day | ORAL | Status: DC

## 2017-11-05 MED ORDER — FUSION PLUS PO CAPS: 1.0000 | ORAL_CAPSULE | Freq: Every day | ORAL | 1 refills | Status: DC

## 2017-11-05 MED ORDER — DIPHENHYDRAMINE HCL 25 MG PO CAPS
25.0000 mg | ORAL_CAPSULE | Freq: Four times a day (QID) | ORAL | Status: DC | PRN
  Administered 2017-11-06 (×2): 25 mg via ORAL
  Filled 2017-11-05 (×2): qty 1

## 2017-11-05 MED ORDER — PRENATAL MULTIVITAMIN CH
1.0000 | ORAL_TABLET | Freq: Every day | ORAL | Status: DC
  Administered 2017-11-06 – 2017-11-08 (×3): 1 via ORAL
  Filled 2017-11-05 (×3): qty 1

## 2017-11-05 MED ORDER — METFORMIN HCL 500 MG PO TABS
1000.0000 mg | ORAL_TABLET | Freq: Two times a day (BID) | ORAL | Status: DC
  Administered 2017-11-06 – 2017-11-08 (×6): 1000 mg via ORAL
  Filled 2017-11-05 (×8): qty 2

## 2017-11-05 MED ORDER — ZOLPIDEM TARTRATE 5 MG PO TABS: 5.0000 mg | ORAL_TABLET | Freq: Every evening | ORAL | Status: DC | PRN

## 2017-11-05 MED ORDER — GLYBURIDE 5 MG PO TABS
5.0000 mg | ORAL_TABLET | Freq: Two times a day (BID) | ORAL | Status: DC
  Administered 2017-11-06 – 2017-11-07 (×3): 5 mg via ORAL
  Filled 2017-11-05 (×5): qty 1

## 2017-11-05 MED ORDER — PANTOPRAZOLE SODIUM 20 MG PO TBEC
20.0000 mg | DELAYED_RELEASE_TABLET | Freq: Two times a day (BID) | ORAL | Status: DC
  Administered 2017-11-06 – 2017-11-08 (×6): 20 mg via ORAL
  Filled 2017-11-05 (×9): qty 1

## 2017-11-05 MED ORDER — ACCU-CHEK GUIDE W/DEVICE KIT: 1.0000 | PACK | Freq: Once | 0 refills | Status: DC

## 2017-11-05 MED ORDER — DOCUSATE SODIUM 100 MG PO CAPS
100.0000 mg | ORAL_CAPSULE | Freq: Every day | ORAL | Status: DC
  Administered 2017-11-06 – 2017-11-08 (×3): 100 mg via ORAL
  Filled 2017-11-05 (×3): qty 1

## 2017-11-05 MED ORDER — ONDANSETRON 4 MG PO TBDP
4.0000 mg | ORAL_TABLET | Freq: Once | ORAL | Status: AC
  Administered 2017-11-06: 4 mg via ORAL
  Filled 2017-11-05: qty 1

## 2017-11-05 NOTE — H&P (Signed)
Jodi Mcgee is an 32 y.o. [phone] 36w2dfemale.   Chief Complaint: Poor BS control HPI: Here with increasing CBGs. Taking po meds. Not checking CBGs [place] She notes that she had random BS today of 240.  Past Medical History:  Diagnosis Date  . Anemia   . Anxiety   . Diabetes mellitus without complication ([place]   . GERD (gastroesophageal reflux disease)    has resolved  . IBS (irritable bowel syndrome)   . Ovarian cyst   . Per[place]itis, acute generalized ([place]   . Sepsis ([facility]     Past Surgical History:  Procedure Laterality Date  . APPENDECTOMY     ruptured   . CESAREAN SECTION    . OVARIAN CYST DRAINAGE    . SMALL BOWEL REPAIR      History reviewed. No pertinent family history. Social History:  reports that she has quit smoking. Her smoking use included cigarettes. She smoked 0.50 packs per day. she has never used smokeless tobacco. She reports that she does not drink alcohol or use drugs.    Allergies  Allergen Reactions  . Cetirizine & Related   . Contrast Media [Iodinated Diagnostic Agents] Itching  . Nsaids Other (See Comments)    None per gi  . Toradol [Ketorolac Tromethamine] Hives and Itching  . Contrast Media [Iodinated Diagnostic Agents] Itching    Mild itching after IV contrast on 6/1/7 No urticaria visible No wheezing No angioedema  . Nsaids Rash    Medications Prior to Admission  Medication Sig Dispense Refill  . ACCU-CHEK FASTCLIX LANCETS MISC 1 each by Percutaneous route 4 (four) times daily. 100 each 12  . acetaminophen (TYLENOL) 500 MG tablet Take 1,000 mg by mouth every 6 (six) hours as needed for moderate pain.    . Blood Glucose Monitoring Suppl (ACCU-CHEK GUIDE) w/Device KIT 1 Device by Does not apply route once for 1 dose. 1 kit 0  . cyclobenzaprine (FLEXERIL) 5 MG tablet Take 1 tablet (5 mg total) by mouth 3 (three) times daily as needed for muscle spasms. 10 tablet 0  . glucose blood (ACCU-CHEK GUIDE)  test strip 1 each by Other route 4 (four) times daily. Use as instructed 100 each 12  . glyBURIDE (DIABETA) 2.5 MG tablet Take 1 tablet (2.5 mg total) by mouth 2 (two) times daily with a meal. 60 tablet 3  . Iron-FA-B Cmp-C-Biot-Probiotic (FUSION PLUS) CAPS Take 1 tablet by mouth daily. 60 capsule 1  . metFORMIN (GLUCOPHAGE) 1000 MG tablet Take 1 tablet (1,000 mg total) by mouth 2 (two) times daily with a meal. 60 tablet 3  . ondansetron (ZOFRAN ODT) 8 MG disintegrating tablet Take 1 tablet (8 mg total) by mouth every 8 (eight) hours as needed for nausea or vomiting. 30 tablet 1  . pantoprazole (PROTONIX) 20 MG tablet Take 1 tablet (20 mg total) by mouth 2 (two) times daily. 60 tablet 1  . triamcinolone ointment (KENALOG) 0.1 % Apply 1 application topically 2 (two) times daily. 30 g 0  . [DISCONTINUED] ferrous sulfate (FERROUSUL) 325 (65 FE) MG tablet Take 1 tablet (325 mg total) by mouth 2 (two) times daily. (Patient not taking: Reported on 11/02/2017) 60 tablet 1     A comprehensive review of systems was negative.  Blood pressure (!) 142/71, pulse 98, temperature 98.1 F (36.7 C), temperature source Oral, resp. rate 18, height _0  (1.676 m), weight 252 lb (114.3 kg), last menstrual period 03/10/2017, SpO2 100 %.  General appearance: alert, cooperative and appears stated age Head: Normocephalic, without obvious abnormality, atraumatic Neck: supple, symmetrical, trachea midline Lungs: normal effort Heart: regular rate and rhythm Abdomen: gravid, non-tender Extremities: Homans sign is negative, no sign of DVT Skin: Skin color, texture, turgor normal. No rashes or lesions Neurologic: Grossly normal   Lab Results  Component Value Date   WBC 6.4 10/05/2017   HGB 9.1 (L) 10/05/2017   HCT 28.2 (L) 10/05/2017   MCV 83.7 10/05/2017   PLT 219 10/05/2017         ABO, Rh: O/Positive/-- (11/01 1058)  Antibody: Negative (11/01 1058)  Rubella: 3.39 (11/01 1058)  RPR: Non Reactive (12/04  1028)  HBsAg: Negative (11/01 1058)  HIV: Non Reactive (12/04 1028)  GBS:       Assessment/Plan Principal Problem:   Type 2 diabetes mellitus complicating pregnancy, antepartum Active Problems:   LGA (large for gestational age) fetus affecting management of mother   Diabetes mellitus in pregnancy, antepartum  For Glycemic control--will optimize oral regimen and increase Glyburide to 5 mg bid--add insulin if insufficient.  Jodi Mcgee 11/05/2017, 11:01 PM

## 2017-11-05 NOTE — Progress Notes (Signed)
Prenatal Visit Note Date: 11/05/2017 Clinic: [facility]  Subjective:  Jodi Mcgee is a 32 y.o. [phone] at [redacted]w[redacted]d being seen today for ongoing prenatal care.  She is currently monitored for the following issues for this high-risk pregnancy and has Previous cesarean section complicating pregnancy; Supervision of high risk pregnancy, antepartum, third trimester; Insufficient prenatal care; Type 2 diabetes mellitus complicating pregnancy, antepartum; Chronic pain syndrome; Unwanted fertility; Transient hypertension of pregnancy; Obesity (BMI 30-39.9); Anemia affecting pregnancy in third trimester; Pelvic adhesive disease; LGA (large for gestational age) fetus affecting management of mother; and Non-compliant pregnant patient, third trimester on their problem list.  Patient reports occasional HA.   Contractions: Not present. Vag. Bleeding: None.  Movement: Present. Denies leaking of fluid.   The following portions of the patient's history were reviewed and updated as appropriate: allergies, current medications, past family history, past medical history, past social history, past surgical history and problem list. Problem list updated.  Objective:   Vitals:   11/05/17 1632  BP: (!) 111/46  Pulse: (!) 101  Weight: 252 lb (114.3 kg)    Fetal Status: Fetal Heart Rate (bpm): 145   Movement: Present     General:  Alert, oriented and cooperative. Patient is in no acute distress.  Skin: Skin is warm and dry. No rash noted.   Cardiovascular: Normal heart rate noted  Respiratory: Normal respiratory effort, no problems with respiration noted  Abdomen: Soft, gravid, appropriate for gestational age. Pain/Pressure: Present     Pelvic:  Cervical exam deferred        Extremities: Normal range of motion.  Edema: None  Mental Status: Normal mood and affect. Normal behavior. Normal judgment and thought content.   Urinalysis:      Assessment and Plan:  Pregnancy: G3P1102 at  [redacted]w[redacted]d  1. Transient hypertension of pregnancy in second trimester Normal BP today.   2. Type 2 diabetes mellitus complicating pregnancy, antepartum Patient states she left her BS checking supplies in [place] and last BS check was with [place] on 1/7; I don't see a number recorded then. Random BS is 249. Given this, non compliance and LGA fetus, I told her I recommend AP admission for BS control given risk of fetal distress given non checking of her BS. She also didn't go to her fetal echo appt and showed up late to her appt today so didn't get an nst/bpp. I recommend ap admission which she is amenable to once she drops her kids off and her family support person can take over care at 1800 today. AP attending aware  3. Previous cesarean section complicating pregnancy Already scheduled for Sunday, 2/17. Repeat roi request for c-section op notes requested.   4. Supervision of high risk pregnancy, antepartum, third trimester Routine care. IUD  5. Anemia affecting pregnancy in third trimester Pt states that medicaid wont cover the ferrous gluconate. Fusion plus sent in and importance d/w her.   6. Non-compliant pregnant patient, third trimester  Preterm labor symptoms and general obstetric precautions including but not limited to vaginal bleeding, contractions, leaking of fluid and fetal movement were reviewed in detail with the patient. Please refer to After Visit Summary for other counseling recommendations.  Return in about 1 week (around 11/12/2017) for needs asap nst/bpp visit preferably in the morning. 1wk hrob visit.   Aletha Halim, MD

## 2017-11-05 NOTE — Progress Notes (Signed)
Pt co headaches

## 2017-11-06 ENCOUNTER — Observation Stay (HOSPITAL_COMMUNITY): Payer: Medicaid Other

## 2017-11-06 ENCOUNTER — Encounter (HOSPITAL_COMMUNITY): Payer: Self-pay

## 2017-11-06 DIAGNOSIS — Z3A34 34 weeks gestation of pregnancy: Secondary | ICD-10-CM

## 2017-11-06 DIAGNOSIS — O24113 Pre-existing diabetes mellitus, type 2, in pregnancy, third trimester: Secondary | ICD-10-CM | POA: Diagnosis not present

## 2017-11-06 LAB — TYPE AND SCREEN
ABO/RH(D): O POS
ANTIBODY SCREEN: NEGATIVE

## 2017-11-06 LAB — GLUCOSE, CAPILLARY
GLUCOSE-CAPILLARY: 167 mg/dL — AB (ref 65–99)
GLUCOSE-CAPILLARY: 151 mg/dL — AB (ref 65–99)
Glucose-Capillary: 93 mg/dL (ref 65–99)

## 2017-11-06 MED ORDER — GLUCOSE BLOOD VI STRP: ORAL_STRIP | 12 refills | Status: DC

## 2017-11-06 MED ORDER — ACCU-CHEK GUIDE W/DEVICE KIT: 1.0000 | PACK | Freq: Once | 0 refills | Status: AC

## 2017-11-06 MED ORDER — FERROUS FUMARATE 324 (106 FE) MG PO TABS
1.0000 | ORAL_TABLET | Freq: Every day | ORAL | Status: DC
  Administered 2017-11-06 – 2017-11-08 (×3): 106 mg via ORAL
  Filled 2017-11-06 (×4): qty 1

## 2017-11-06 MED ORDER — ACCU-CHEK FASTCLIX LANCETS MISC: 1.0000 | Freq: Four times a day (QID) | 12 refills | Status: DC

## 2017-11-06 NOTE — Progress Notes (Signed)
Patient ID: Jodi Mcgee, female   DOB: 1986-05-08, 32 y.o.   MRN: [id number] [place] PROGRESS NOTE  Jodi Mcgee is a 32 y.o. [id number] at [redacted]w[redacted]d  who is admitted for glycemic control.   Fetal presentation is unsure. Length of Stay:  1  Days  Subjective: Pt without complaints this morning except tried. Denies Ut ctx, VB or LOF. + FM   Vitals:  Blood pressure (!) 98/57, pulse 94, temperature 98.1 F (36.7 C), temperature source Oral, resp. rate 16, height 5\' 6"  (1.676 m), weight 114.3 kg (252 lb), last menstrual period 03/10/2017, SpO2 100 %.   Physical Examination: Lungs clear Heart RRR Abd soft + BS gravid non tender Ext non tender  Fetal Monitoring:  140's + accels. reactive  Labs:  Results for orders placed or performed during the hospital encounter of 11/05/17 (from the past 24 hour(s))  CBC on admission   Collection Time: 11/05/17 11:24 PM  Result Value Ref Range   WBC 5.1 4.0 - 10.5 K/uL   RBC 3.33 (L) 3.87 - 5.11 MIL/uL   Hemoglobin 8.6 (L) 12.0 - 15.0 g/dL   HCT 26.3 (L) 36.0 - 46.0 %   MCV 79.0 78.0 - 100.0 fL   MCH 25.8 (L) 26.0 - 34.0 pg   MCHC 32.7 30.0 - 36.0 g/dL   RDW 14.5 11.5 - 15.5 %   Platelets 172 150 - 400 K/uL  Type and screen [place]   Collection Time: 11/05/17 11:24 PM  Result Value Ref Range   ABO/RH(D) O POS    Antibody Screen NEG    Sample Expiration 11/08/2017   Glucose, capillary   Collection Time: 11/06/17  6:12 AM  Result Value Ref Range   Glucose-Capillary 167 (H) 65 - 99 mg/dL  Results for orders placed or performed in visit on 11/05/17 (from the past 24 hour(s))  Glucose, capillary   Collection Time: 11/05/17  4:47 PM  Result Value Ref Range   Glucose-Capillary 249 (H) 65 - 99 mg/dL    Imaging Studies:    none   Medications:  Scheduled . docusate sodium  100 mg Oral Daily  . Ferrous Fumarate  1 tablet Oral Daily  . glyBURIDE  5 mg Oral BID WC  . metFORMIN  1,000  mg Oral BID WC  . pantoprazole  20 mg Oral BID  . prenatal multivitamin  1 tablet Oral Q1200   I have reviewed the patient's current medications.  ASSESSMENT: IUP 34 3/7 weeks DM, poor control Prior c section x 2  PLAN: Medication adjustment for DM control. BPP without NST today Continue routine antenatal care.   Chancy Milroy 11/06/2017,9:42 AM

## 2017-11-06 NOTE — Addendum Note (Signed)
Addended by: Shelly Coss on: 11/06/2017 12:06 PM   Modules accepted: Orders

## 2017-11-07 DIAGNOSIS — Z87891 Personal history of nicotine dependence: Secondary | ICD-10-CM | POA: Diagnosis not present

## 2017-11-07 DIAGNOSIS — E119 Type 2 diabetes mellitus without complications: Secondary | ICD-10-CM | POA: Diagnosis present

## 2017-11-07 DIAGNOSIS — Z3A34 34 weeks gestation of pregnancy: Secondary | ICD-10-CM | POA: Diagnosis not present

## 2017-11-07 DIAGNOSIS — O24112 Pre-existing diabetes mellitus, type 2, in pregnancy, second trimester: Secondary | ICD-10-CM | POA: Diagnosis present

## 2017-11-07 DIAGNOSIS — O24113 Pre-existing diabetes mellitus, type 2, in pregnancy, third trimester: Secondary | ICD-10-CM | POA: Diagnosis present

## 2017-11-07 DIAGNOSIS — O3663X0 Maternal care for excessive fetal growth, third trimester, not applicable or unspecified: Secondary | ICD-10-CM | POA: Diagnosis present

## 2017-11-07 DIAGNOSIS — Z7984 Long term (current) use of oral hypoglycemic drugs: Secondary | ICD-10-CM | POA: Diagnosis not present

## 2017-11-07 LAB — GLUCOSE, CAPILLARY
Glucose-Capillary: 131 mg/dL — ABNORMAL HIGH (ref 65–99)
Glucose-Capillary: 126 mg/dL — ABNORMAL HIGH (ref 65–99)
Glucose-Capillary: 85 mg/dL (ref 65–99)

## 2017-11-07 MED ORDER — CYCLOBENZAPRINE HCL 5 MG PO TABS
7.5000 mg | ORAL_TABLET | Freq: Three times a day (TID) | ORAL | Status: DC | PRN
  Administered 2017-11-07 – 2017-11-08 (×2): 7.5 mg via ORAL
  Filled 2017-11-07 (×3): qty 1.5

## 2017-11-07 MED ORDER — TERBUTALINE SULFATE 1 MG/ML IJ SOLN
0.2500 mg | Freq: Once | INTRAMUSCULAR | Status: AC
Start: 2017-11-07 — End: 2017-11-07
  Administered 2017-11-07: 0.25 mg via SUBCUTANEOUS
  Filled 2017-11-07: qty 1

## 2017-11-07 MED ORDER — LACTATED RINGERS IV BOLUS (SEPSIS)
1000.0000 mL | Freq: Once | INTRAVENOUS | Status: AC
  Administered 2017-11-07: 1000 mL via INTRAVENOUS

## 2017-11-07 MED ORDER — MAGNESIUM SULFATE 40 G IN LACTATED RINGERS - SIMPLE
2.0000 g/h | INTRAVENOUS | Status: AC
  Filled 2017-11-07: qty 500

## 2017-11-07 MED ORDER — OXYCODONE-ACETAMINOPHEN 5-325 MG PO TABS
1.0000 | ORAL_TABLET | Freq: Once | ORAL | Status: AC
  Administered 2017-11-07: 1 via ORAL
  Filled 2017-11-07: qty 1

## 2017-11-07 MED ORDER — NIFEDIPINE 10 MG PO CAPS
20.0000 mg | ORAL_CAPSULE | Freq: Once | ORAL | Status: AC
  Administered 2017-11-07: 20 mg via ORAL
  Filled 2017-11-07: qty 2

## 2017-11-07 MED ORDER — PROMETHAZINE HCL 25 MG/ML IJ SOLN
12.5000 mg | Freq: Once | INTRAMUSCULAR | Status: AC
  Administered 2017-11-07: 12.5 mg via INTRAVENOUS
  Filled 2017-11-07: qty 1

## 2017-11-07 MED ORDER — BUTORPHANOL TARTRATE 1 MG/ML IJ SOLN
1.0000 mg | Freq: Once | INTRAMUSCULAR | Status: AC
  Administered 2017-11-07: 1 mg via INTRAVENOUS
  Filled 2017-11-07: qty 1

## 2017-11-07 MED ORDER — GLYBURIDE 5 MG PO TABS
7.5000 mg | ORAL_TABLET | Freq: Two times a day (BID) | ORAL | Status: DC
  Administered 2017-11-07 – 2017-11-08 (×2): 7.5 mg via ORAL
  Filled 2017-11-07 (×4): qty 1

## 2017-11-07 MED ORDER — NIFEDIPINE ER OSMOTIC RELEASE 30 MG PO TB24
30.0000 mg | ORAL_TABLET | Freq: Two times a day (BID) | ORAL | Status: DC
  Administered 2017-11-07 – 2017-11-08 (×2): 30 mg via ORAL
  Filled 2017-11-07 (×2): qty 1

## 2017-11-07 MED ORDER — MAGNESIUM SULFATE BOLUS VIA INFUSION
4.0000 g | Freq: Once | INTRAVENOUS | Status: AC
Start: 2017-11-07 — End: 2017-11-07
  Administered 2017-11-07: 4 g via INTRAVENOUS
  Filled 2017-11-07: qty 500

## 2017-11-07 MED ORDER — NIFEDIPINE 10 MG PO CAPS
10.0000 mg | ORAL_CAPSULE | Freq: Once | ORAL | Status: AC
  Administered 2017-11-07: 10 mg via ORAL
  Filled 2017-11-07: qty 1

## 2017-11-07 MED ORDER — LACTATED RINGERS IV SOLN
INTRAVENOUS | Status: DC
  Administered 2017-11-07: 125 mL/h via INTRAVENOUS
  Administered 2017-11-07: 16:00:00 via INTRAVENOUS

## 2017-11-07 NOTE — Progress Notes (Signed)
Diabetes Treatment Program Recommendations  ADA Standards of Care 2017 Diabetes in Pregnancy Target Glucose Ranges:  Fasting: 60 - 90 mg/dL Preprandial: 60 - 105 mg/dL 1 hr postprandial: Less than 140mg /dL (from first bite of meal) 2 hr postprandial: Less than 120 mg/dL (from first bit of meal)  Attempted to speak to patient @ bedside regarding glycemic control. Patient states she is having a severe headache and doesn't feel like talking today. Will follow to review if will need to be discharged home on insulin.   Thank you, Nani Gasser. Hurley Sobel, RN, MSN, CDE  Diabetes Coordinator Inpatient Glycemic Control Team Team Pager [phone] (8am-5pm) 11/07/2017 1:53 PM

## 2017-11-07 NOTE — Progress Notes (Signed)
Initial Nutrition Assessment  DOCUMENTATION CODES:   Obesity unspecified  INTERVENTION:  Gestational Diabetic Diet  NUTRITION DIAGNOSIS:  Increased nutrient needs related to (pregnancy and fetal growth requirements) as evidenced by (34 weeks IUP).   GOAL:  Patient will meet greater than or equal to 90% of their needs  MONITOR:  Labs  REASON FOR ASSESSMENT:  Antenatal, Gestational Diabetes    ASSESSMENT:   34 4/7 weeks, adm for hyperglycemia. Contractions. Hx DM II   Pre-preg weight 220 lbs, BMI 35.6.  32 lb weight gain   Diet Order:  Diet gestational carb mod Room service appropriate? Yes; Fluid consistency: Thin  EDUCATION NEEDS:   Education needs have been addressed(outpt educ 09/25/17)  Skin:  Skin Assessment: Reviewed RN Assessment  Height:   Ht Readings from Last 1 Encounters:  11/05/17 5\' 6"  (1.676 m)    Weight:   Wt Readings from Last 1 Encounters:  11/06/17 252 lb (114.3 kg)    Ideal Body Weight:  59.09 kg  BMI:  Body mass index is 40.67 kg/m.  Estimated Nutritional Needs:   Kcal:  2200-2400  Protein:  100-110 g  Fluid:  2.5 L    Weyman Rodney M.Fredderick Severance LDN Neonatal Nutrition Support Specialist/RD III Pager [phone]      Phone [phone]

## 2017-11-07 NOTE — Progress Notes (Signed)
Patient ID: Jodi Mcgee, female   DOB: October 03, 1986, 32 y.o.   MRN: [id number] Less ut ctx. Occ HA Will d/c magnesium after 12 hrs Flexeril PRN for HA Will increase Glyburide to 7.5 mg bid

## 2017-11-07 NOTE — Progress Notes (Signed)
Patient ID: Jodi Mcgee, female   DOB: February 06, 1986, 32 y.o.   MRN: [id number] [place] PROGRESS NOTE  Jodi Mcgee is a 32 y.o. [id number] at [facility]  who is admitted for glycemic control Fetal presentation is unsure. Length of Stay:  1  Days  Subjective: Pt started having ut ctx this morning. Was started on Magnesium. Ut ctx have decreased but still present. No VB or LOF. Some vaginal pressure as well.   Vitals:  Blood pressure 111/70, pulse 99, temperature 98.6 F (37 C), temperature source Oral, resp. rate 16, height 5\' 6"  (1.676 m), weight 114.3 kg (252 lb), last menstrual period 03/10/2017, SpO2 96 %.   Physical Examination: Lungs clear Heart RRR Abd soft +BS gravid Ext non tender  Fetal Monitoring:  140-150's  Labs:  Results for orders placed or performed during the hospital encounter of 11/05/17 (from the past 24 hour(s))  Glucose, capillary   Collection Time: 11/06/17 12:21 PM  Result Value Ref Range   Glucose-Capillary 151 (H) 65 - 99 mg/dL  Glucose, capillary   Collection Time: 11/06/17 11:36 PM  Result Value Ref Range   Glucose-Capillary 93 65 - 99 mg/dL   Comment 1 Document in Chart   Glucose, capillary   Collection Time: 11/07/17  7:28 AM  Result Value Ref Range   Glucose-Capillary 131 (H) 65 - 99 mg/dL    Imaging Studies:    none   Medications:  Scheduled . docusate sodium  100 mg Oral Daily  . Ferrous Fumarate  1 tablet Oral Daily  . glyBURIDE  5 mg Oral BID WC  . metFORMIN  1,000 mg Oral BID WC  . pantoprazole  20 mg Oral BID  . prenatal multivitamin  1 tablet Oral Q1200  . terbutaline  0.25 mg Subcutaneous Once   I have reviewed the patient's current medications.  ASSESSMENT: IUP 34 3/7 weeks DM Preterm ut ctx Prior c section x 2  PLAN: BS improving with medications. Ut ctx responding to magnesium. Will give dose of terb x 1. Continue to monitor. Plan to stop magnesium later today.  Continue routine  antenatal care.   Chancy Milroy 11/07/2017,9:43 AM

## 2017-11-08 LAB — GLUCOSE, CAPILLARY
Glucose-Capillary: 119 mg/dL — ABNORMAL HIGH (ref 65–99)
Glucose-Capillary: 95 mg/dL (ref 65–99)

## 2017-11-08 MED ORDER — TERBUTALINE SULFATE 1 MG/ML IJ SOLN
0.2500 mg | Freq: Once | INTRAMUSCULAR | Status: AC
  Administered 2017-11-08: 0.25 mg via SUBCUTANEOUS
  Filled 2017-11-08: qty 1

## 2017-11-08 MED ORDER — NIFEDIPINE ER 30 MG PO TB24: 30.0000 mg | ORAL_TABLET | Freq: Two times a day (BID) | ORAL | 2 refills | Status: DC

## 2017-11-08 MED ORDER — GLYBURIDE 2.5 MG PO TABS: 7.5000 mg | ORAL_TABLET | Freq: Two times a day (BID) | ORAL | 2 refills | Status: DC

## 2017-11-08 MED ORDER — NIFEDIPINE 10 MG PO CAPS
30.0000 mg | ORAL_CAPSULE | Freq: Once | ORAL | Status: AC
  Administered 2017-11-08: 30 mg via ORAL
  Filled 2017-11-08: qty 3

## 2017-11-08 MED ORDER — GENTAMICIN SULFATE 0.3 % OP SOLN: 2.0000 [drp] | Freq: Three times a day (TID) | OPHTHALMIC | 0 refills | Status: DC

## 2017-11-08 NOTE — Progress Notes (Signed)
Dr Rip Harbour in to see patient. She will be discharged home today.  He evaluated her strip and said she could come off the monitor.

## 2017-11-08 NOTE — Discharge Summary (Signed)
Physician Discharge Summary  Patient ID: Jodi Mcgee MRN: [id number] DOB/AGE: [birth year] 32 y.o.  Admit date: 11/05/2017 Discharge date: 11/08/2017  Admission Diagnoses: IUP 34 weeks Prior c section x 2, DM uncontrolled  Discharge Diagnoses:  Principal Problem:   Type 2 diabetes mellitus complicating pregnancy, antepartum Active Problems:   LGA (large for gestational age) fetus affecting management of mother   Diabetes mellitus in pregnancy, antepartum   Discharged Condition: good  Hospital Course: Pt was admitted for glycemic control. Pt had not been following diabetic diet or checking BS at home. Random BS in clinic 200's. Pt was admitted oral regiment was changed. Metformin was continued and glyburide was increased to 7.5 mg bid. BS improved to almost goal range.  She did have an episode of preterm uterine contractions, received magnesium x 12 hours with resolution. Placed on Procardia bid with good response.  Fetal well being remained reassuring during hospitalization. Pt was ambulating, voiding and tolerating diet.  Discharge instructions and importance of following diet and monitoring BS reviewed with pt. Felt amendable for discharge home with follow up on Monday  Consults: None  Significant Diagnostic Studies: labs:   Treatments: IV hydration  Discharge Exam: Blood pressure (!) 110/58, pulse (!) 102, temperature 97.9 F (36.6 C), temperature source Oral, resp. rate 17, height '5\' 6"'  (1.676 m), weight 114.3 kg (252 lb), last menstrual period 03/10/2017, SpO2 95 %.  Lungs clear Heart RRR Abd soft + BS gravid non tender Ext non tender  Disposition: 01-Home or Self Care  Discharge Instructions    Discharge activity:  No Restrictions   Complete by:  As directed    Discharge diet:   Complete by:  As directed    ADA 2000 cal   Fetal Kick Count:  Lie on our left side for one hour after a meal, and count the number of times your baby kicks.  If it is less than 5  times, get up, move around and drink some juice.  Repeat the test 30 minutes later.  If it is still less than 5 kicks in an hour, notify your doctor.   Complete by:  As directed    No sexual activity restrictions   Complete by:  As directed    Notify physician for a general feeling that "something is not right"   Complete by:  As directed    Notify physician for increase or change in vaginal discharge   Complete by:  As directed    Notify physician for intestinal cramps, with or without diarrhea, sometimes described as "gas pain"   Complete by:  As directed    Notify physician for leaking of fluid   Complete by:  As directed    Notify physician for low, dull backache, unrelieved by heat or Tylenol   Complete by:  As directed    Notify physician for menstrual like cramps   Complete by:  As directed    Notify physician for pelvic pressure   Complete by:  As directed    Notify physician for uterine contractions.  These may be painless and feel like the uterus is tightening or the baby is  "balling up"   Complete by:  As directed    Notify physician for vaginal bleeding   Complete by:  As directed    PRETERM LABOR:  Includes any of the follwing symptoms that occur between 20 - [redacted] weeks gestation.  If these symptoms are not stopped, preterm labor can result in preterm delivery, placing your  baby at risk   Complete by:  As directed      Allergies as of 11/08/2017      Reactions   Cetirizine & Related    Toradol [ketorolac Tromethamine] Hives, Itching   Contrast Media [iodinated Diagnostic Agents] Itching   Mild itching after IV contrast on 6/1/7 No urticaria visible No wheezing No angioedema   Nsaids Rash, Other (See Comments)   None per gi      Medication List    STOP taking these medications   ACCU-CHEK GUIDE w/Device Kit     TAKE these medications   ACCU-CHEK FASTCLIX LANCETS Misc 1 Device by Percutaneous route 4 (four) times daily. QID as instructed   acetaminophen 500  MG tablet Commonly known as:  TYLENOL Take 1,000 mg by mouth every 6 (six) hours as needed for moderate pain.   cyclobenzaprine 5 MG tablet Commonly known as:  FLEXERIL Take 1 tablet (5 mg total) by mouth 3 (three) times daily as needed for muscle spasms.   FUSION PLUS Caps Take 1 tablet by mouth daily.   glyBURIDE 2.5 MG tablet Commonly known as:  DIABETA Take 3 tablets (7.5 mg total) by mouth 2 (two) times daily with a meal. What changed:  how much to take   metFORMIN 1000 MG tablet Commonly known as:  GLUCOPHAGE Take 1 tablet (1,000 mg total) by mouth 2 (two) times daily with a meal.   NIFEdipine 30 MG 24 hr tablet Commonly known as:  PROCARDIA-XL/ADALAT CC Take 1 tablet (30 mg total) by mouth 2 (two) times daily.   ondansetron 8 MG disintegrating tablet Commonly known as:  ZOFRAN ODT Take 1 tablet (8 mg total) by mouth every 8 (eight) hours as needed for nausea or vomiting.   pantoprazole 20 MG tablet Commonly known as:  PROTONIX Take 1 tablet (20 mg total) by mouth 2 (two) times daily.   triamcinolone ointment 0.1 % Commonly known as:  KENALOG Apply 1 application topically 2 (two) times daily.      Follow-up [place] for [place] Follow up.   Specialty:  Obstetrics and Gynecology Why:  Pt already has appt for Monday November 12 2017 Contact information: [place] [phone]          Signed: Chancy Milroy 11/08/2017, 10:42 AM

## 2017-11-09 NOTE — Progress Notes (Signed)
Pt arrived late for scheduled appt and NST/BPP could not be performed. Pt had Ob visit w/Dr. Ilda Basset and subsequently had admission for glucose control. Fetal testing will be performed during inpatient stay.

## 2017-11-12 ENCOUNTER — Ambulatory Visit: Payer: Self-pay

## 2017-11-12 ENCOUNTER — Ambulatory Visit (INDEPENDENT_AMBULATORY_CARE_PROVIDER_SITE_OTHER): Payer: Medicaid Other | Admitting: *Deleted

## 2017-11-12 ENCOUNTER — Ambulatory Visit (INDEPENDENT_AMBULATORY_CARE_PROVIDER_SITE_OTHER): Payer: Medicaid Other | Admitting: Obstetrics & Gynecology

## 2017-11-12 VITALS — BP 128/69 | HR 104 | Wt 264.1 lb

## 2017-11-12 DIAGNOSIS — O4703 False labor before 37 completed weeks of gestation, third trimester: Secondary | ICD-10-CM

## 2017-11-12 DIAGNOSIS — O403XX0 Polyhydramnios, third trimester, not applicable or unspecified: Secondary | ICD-10-CM

## 2017-11-12 DIAGNOSIS — O0993 Supervision of high risk pregnancy, unspecified, third trimester: Secondary | ICD-10-CM

## 2017-11-12 DIAGNOSIS — O24119 Pre-existing diabetes mellitus, type 2, in pregnancy, unspecified trimester: Secondary | ICD-10-CM

## 2017-11-12 DIAGNOSIS — O34219 Maternal care for unspecified type scar from previous cesarean delivery: Secondary | ICD-10-CM

## 2017-11-12 DIAGNOSIS — R1011 Right upper quadrant pain: Secondary | ICD-10-CM

## 2017-11-12 LAB — POCT URINALYSIS DIP (DEVICE)
Bilirubin Urine: NEGATIVE
Glucose, UA: NEGATIVE mg/dL
Ketones, ur: NEGATIVE mg/dL
Leukocytes, UA: NEGATIVE
Nitrite: NEGATIVE
PH: 6 (ref 5.0–8.0)
PROTEIN: 100 mg/dL — AB
Specific Gravity, Urine: 1.025 (ref 1.005–1.030)
Urobilinogen, UA: 2 mg/dL — ABNORMAL HIGH (ref 0.0–1.0)

## 2017-11-12 MED ORDER — NIFEDIPINE ER 30 MG PO TB24: 60.0000 mg | ORAL_TABLET | Freq: Two times a day (BID) | ORAL | 2 refills | Status: DC

## 2017-11-12 MED ORDER — CYCLOBENZAPRINE HCL 10 MG PO TABS: 10.0000 mg | ORAL_TABLET | Freq: Three times a day (TID) | ORAL | 5 refills | Status: DC | PRN

## 2017-11-12 NOTE — Progress Notes (Signed)
Pt has H/A daily - takes Tylenol which resolves the H/A somewhat. She denies visual disturbances. She also reports having constant RUQ abdominal pain and tightness.

## 2017-11-12 NOTE — Progress Notes (Signed)

## 2017-11-12 NOTE — Progress Notes (Signed)
   PRENATAL VISIT NOTE  Subjective:  Jodi Mcgee is a 32 y.o. [id number] at [redacted]w[redacted]d being seen today for ongoing prenatal care.  She is currently monitored for the following issues for this high-risk pregnancy and has Previous cesarean section complicating pregnancy; Supervision of high risk pregnancy, antepartum, third trimester; Insufficient prenatal care; Type 2 diabetes mellitus complicating pregnancy, antepartum; Chronic pain syndrome; Unwanted fertility; Transient hypertension of pregnancy; Obesity (BMI 30-39.9); Anemia affecting pregnancy in third trimester; Pelvic adhesive disease; LGA (large for gestational age) fetus affecting management of mother; Non-compliant pregnant patient, third trimester; and Diabetes mellitus in pregnancy, antepartum on their problem list.  Patient reports occasional contractions and persistent RUQ pain. Occasional headaches.  Contractions: Irregular. Vag. Bleeding: None.  Movement: Present. Denies leaking of fluid.   The following portions of the patient's history were reviewed and updated as appropriate: allergies, current medications, past family history, past medical history, past social history, past surgical history and problem list. Problem list updated.  Objective:   Vitals:   11/12/17 0839  BP: 128/69  Pulse: (!) 104  Weight: 264 lb 1.6 oz (119.8 kg)    Fetal Status: Fetal Heart Rate (bpm): NST   Movement: Present     General:  Alert, oriented and cooperative. Patient is in no acute distress.  Skin: Skin is warm and dry. No rash noted.   Cardiovascular: Normal heart rate noted  Respiratory: Normal respiratory effort, no problems with respiration noted  Abdomen: Soft, gravid, appropriate for gestational age.  Pain/Pressure: Present     Pelvic: Cervical exam deferred        Extremities: Normal range of motion.  Edema: Trace  Mental Status:  Normal mood and affect. Normal behavior. Normal judgment and thought content.   Assessment and Plan:    Pregnancy: G3P1102 at [redacted]w[redacted]d  1. Type 2 diabetes mellitus complicating pregnancy, antepartum Fastings are within range, elevated lunch and dinner in 140s.  Advised to take Glyburide and Metformin as instructed.  NST performed today was reviewed and was found to be reactive. Subsequent BPP performed today was also reviewed and was found to be 10/10.  Continue recommended antenatal testing and prenatal care.  2. Polyhydramnios affecting pregnancy in third trimester AFI 27.4 cm, continue to monitor closely.  3. RUQ pain Will check labs today. - CBC - Comprehensive metabolic panel - Protein / creatinine ratio, urine  4. Previous cesarean section complicating pregnancy Already scheduled for repeat cesarean section  5. Premature uterine contractions, antepartum, third trimester - NIFEdipine (PROCARDIA-XL/ADALAT CC) 30 MG 24 hr tablet; Take 2 tablets (60 mg total) by mouth 2 (two) times daily.  Dispense: 60 tablet; Refill: 2 - cyclobenzaprine (FLEXERIL) 10 MG tablet; Take 1 tablet (10 mg total) by mouth 3 (three) times daily as needed for muscle spasms.  Dispense: 30 tablet; Refill: 5  6. Supervision of high risk pregnancy, antepartum, third trimester Preterm labor symptoms and general obstetric precautions including but not limited to vaginal bleeding, contractions, leaking of fluid and fetal movement were reviewed in detail with the patient. Please refer to After Visit Summary for other counseling recommendations.  Return in about 7 days (around 11/19/2017) for NST and [facility] - has [place] @ 0830.   Verita Schneiders, MD

## 2017-11-13 LAB — COMPREHENSIVE METABOLIC PANEL
ALK PHOS: 95 IU/L (ref 39–117)
ALT: 12 IU/L (ref 0–32)
AST: 11 IU/L (ref 0–40)
Albumin/Globulin Ratio: 1 — ABNORMAL LOW (ref 1.2–2.2)
Albumin: 3.1 g/dL — ABNORMAL LOW (ref 3.5–5.5)
BUN/Creatinine Ratio: 12 (ref 9–23)
BUN: 7 mg/dL (ref 6–20)
Bilirubin Total: 0.2 mg/dL (ref 0.0–1.2)
CO2: 22 mmol/L (ref 20–29)
CREATININE: 0.57 mg/dL (ref 0.57–1.00)
Calcium: 8.4 mg/dL — ABNORMAL LOW (ref 8.7–10.2)
Chloride: 102 mmol/L (ref 96–106)
GFR calc Af Amer: 142 mL/min/{1.73_m2} (ref >59)
GFR calc non Af Amer: 123 mL/min/{1.73_m2} (ref >59)
GLOBULIN, TOTAL: 3.1 g/dL (ref 1.5–4.5)
GLUCOSE: 225 mg/dL — AB (ref 65–99)
Potassium: 3.5 mmol/L (ref 3.5–5.2)
SODIUM: 138 mmol/L (ref 134–144)
Total Protein: 6.2 g/dL (ref 6.0–8.5)

## 2017-11-13 LAB — CBC
Hematocrit: 27.7 % — ABNORMAL LOW (ref 34.0–46.6)
Hemoglobin: 8.8 g/dL — ABNORMAL LOW (ref 11.1–15.9)
MCH: 25.5 pg — ABNORMAL LOW (ref 26.6–33.0)
MCHC: 31.8 g/dL (ref 31.5–35.7)
MCV: 80 fL (ref 79–97)
Platelets: 170 10*3/uL (ref 150–379)
RBC: 3.45 x10E6/uL — ABNORMAL LOW (ref 3.77–5.28)
RDW: 16.3 % — AB (ref 12.3–15.4)
WBC: 5.4 10*3/uL (ref 3.4–10.8)

## 2017-11-13 LAB — PROTEIN / CREATININE RATIO, URINE
CREATININE, UR: 117.3 mg/dL
PROTEIN UR: 92.6 mg/dL
Protein/Creat Ratio: 789 mg/g creat — ABNORMAL HIGH (ref 0–200)

## 2017-11-14 ENCOUNTER — Other Ambulatory Visit: Payer: Self-pay | Admitting: Obstetrics & Gynecology

## 2017-11-14 DIAGNOSIS — O99013 Anemia complicating pregnancy, third trimester: Secondary | ICD-10-CM

## 2017-11-14 MED ORDER — FERROUS SULFATE 325 (65 FE) MG PO TABS: 325.0000 mg | ORAL_TABLET | Freq: Three times a day (TID) | ORAL | 1 refills | Status: DC

## 2017-11-15 ENCOUNTER — Observation Stay (HOSPITAL_COMMUNITY)
Admission: AD | Admit: 2017-11-15 | Discharge: 2017-11-17 | Disposition: A | Discharge status: Home / Self Care | Payer: Medicaid Other | Source: Ambulatory Visit | Attending: Obstetrics and Gynecology | Admitting: Obstetrics and Gynecology

## 2017-11-15 ENCOUNTER — Encounter (HOSPITAL_COMMUNITY): Payer: Self-pay

## 2017-11-15 DIAGNOSIS — R109 Unspecified abdominal pain: Secondary | ICD-10-CM

## 2017-11-15 DIAGNOSIS — Z3009 Encounter for other general counseling and advice on contraception: Secondary | ICD-10-CM

## 2017-11-15 DIAGNOSIS — O9989 Other specified diseases and conditions complicating pregnancy, childbirth and the puerperium: Secondary | ICD-10-CM | POA: Diagnosis not present

## 2017-11-15 DIAGNOSIS — O133 Gestational [pregnancy-induced] hypertension without significant proteinuria, third trimester: Secondary | ICD-10-CM | POA: Insufficient documentation

## 2017-11-15 DIAGNOSIS — Z79899 Other long term (current) drug therapy: Secondary | ICD-10-CM | POA: Diagnosis not present

## 2017-11-15 DIAGNOSIS — Z885 Allergy status to narcotic agent status: Secondary | ICD-10-CM | POA: Insufficient documentation

## 2017-11-15 DIAGNOSIS — Z3A35 35 weeks gestation of pregnancy: Secondary | ICD-10-CM | POA: Insufficient documentation

## 2017-11-15 DIAGNOSIS — O403XX0 Polyhydramnios, third trimester, not applicable or unspecified: Secondary | ICD-10-CM | POA: Insufficient documentation

## 2017-11-15 DIAGNOSIS — Z888 Allergy status to other drugs, medicaments and biological substances status: Secondary | ICD-10-CM | POA: Insufficient documentation

## 2017-11-15 DIAGNOSIS — E669 Obesity, unspecified: Secondary | ICD-10-CM | POA: Diagnosis not present

## 2017-11-15 DIAGNOSIS — O99213 Obesity complicating pregnancy, third trimester: Secondary | ICD-10-CM | POA: Diagnosis not present

## 2017-11-15 DIAGNOSIS — Z91041 Radiographic dye allergy status: Secondary | ICD-10-CM | POA: Diagnosis not present

## 2017-11-15 DIAGNOSIS — Z886 Allergy status to analgesic agent status: Secondary | ICD-10-CM | POA: Diagnosis not present

## 2017-11-15 DIAGNOSIS — O0993 Supervision of high risk pregnancy, unspecified, third trimester: Secondary | ICD-10-CM

## 2017-11-15 DIAGNOSIS — Z7984 Long term (current) use of oral hypoglycemic drugs: Secondary | ICD-10-CM | POA: Insufficient documentation

## 2017-11-15 DIAGNOSIS — O3663X0 Maternal care for excessive fetal growth, third trimester, not applicable or unspecified: Secondary | ICD-10-CM | POA: Insufficient documentation

## 2017-11-15 DIAGNOSIS — O4703 False labor before 37 completed weeks of gestation, third trimester: Principal | ICD-10-CM | POA: Insufficient documentation

## 2017-11-15 DIAGNOSIS — N1330 Unspecified hydronephrosis: Secondary | ICD-10-CM | POA: Insufficient documentation

## 2017-11-15 DIAGNOSIS — O34219 Maternal care for unspecified type scar from previous cesarean delivery: Secondary | ICD-10-CM | POA: Insufficient documentation

## 2017-11-15 DIAGNOSIS — R058 Other specified cough: Secondary | ICD-10-CM

## 2017-11-15 DIAGNOSIS — M549 Dorsalgia, unspecified: Secondary | ICD-10-CM

## 2017-11-15 DIAGNOSIS — R05 Cough: Secondary | ICD-10-CM

## 2017-11-15 DIAGNOSIS — O99891 Other specified diseases and conditions complicating pregnancy: Secondary | ICD-10-CM

## 2017-11-15 DIAGNOSIS — O24913 Unspecified diabetes mellitus in pregnancy, third trimester: Secondary | ICD-10-CM | POA: Insufficient documentation

## 2017-11-15 DIAGNOSIS — Z87891 Personal history of nicotine dependence: Secondary | ICD-10-CM | POA: Diagnosis not present

## 2017-11-15 DIAGNOSIS — O24119 Pre-existing diabetes mellitus, type 2, in pregnancy, unspecified trimester: Secondary | ICD-10-CM

## 2017-11-15 LAB — URINALYSIS, ROUTINE W REFLEX MICROSCOPIC
Bilirubin Urine: NEGATIVE
Glucose, UA: >=500 mg/dL — AB
Hgb urine dipstick: NEGATIVE
Ketones, ur: NEGATIVE mg/dL
Leukocytes, UA: NEGATIVE
Nitrite: NEGATIVE
Protein, ur: 30 mg/dL — AB
Specific Gravity, Urine: 1.009 (ref 1.005–1.030)
pH: 6 (ref 5.0–8.0)

## 2017-11-15 NOTE — MAU Note (Signed)
Pt here with c/o contractions that started about 3 or 4 pm today. Took Procardia about 4 pm and again about 9pm. Had steriod shots last week when in the hospital. Denies any bleeding or leaking of fluid.

## 2017-11-15 NOTE — MAU Provider Note (Signed)
Chief Complaint:  Contractions   First Provider Initiated Contact with Patient 11/15/17 2321     HPI: Jodi Mcgee is a 32 y.o. [id number] at 71w5dwho presents to maternity admissions reporting pain in lower abdomen, mostly RLQ and low back.  Also having contractions.  But there is pain even between contractions. . She reports good fetal movement, denies LOF, vaginal bleeding, vaginal itching/burning, urinary symptoms, h/a, dizziness, n/v, diarrhea, constipation or fever/chills.    Was admitted recently for blood sugar control and was noted to be having preterm labor. Was given steroids and Procardia, which helped stop contractions. Sent home with Rx for Procardia.   Abdominal Pain  This is a recurrent problem. The current episode started today. The onset quality is gradual. The problem occurs intermittently. The problem has been gradually worsening. The pain is located in the RLQ and suprapubic region. The quality of the pain is aching and cramping. The abdominal pain radiates to the back. Pertinent negatives include no constipation, diarrhea, dysuria, fever, headaches, nausea or vomiting. Nothing aggravates the pain. The pain is relieved by nothing. Treatments tried: Procardia.   Patient Active Problem List   Diagnosis Date Noted  . Non-compliant pregnant patient, third trimester 11/05/2017  . Diabetes mellitus in pregnancy, antepartum 11/05/2017  . Pelvic adhesive disease 10/22/2017  . LGA (large for gestational age) fetus affecting management of mother 10/22/2017  . Anemia affecting pregnancy in third trimester 10/11/2017  . Obesity (BMI 30-39.9) 09/25/2017  . Transient hypertension of pregnancy 09/13/2017  . Unwanted fertility 08/30/2017  . Previous cesarean section complicating pregnancy 41/32/4401  . Supervision of high risk pregnancy, antepartum, third trimester 08/23/2017  . Insufficient prenatal care 08/23/2017  . Type 2 diabetes mellitus complicating pregnancy, antepartum  08/23/2017  . Chronic pain syndrome 08/23/2017     RN Note: Pt here with c/o contractions that started about 3 or 4 pm today. Took Procardia about 4 pm and again about 9pm. Had steriod shots last week when in the hospital. Denies any bleeding or leaking of fluid.    Past Medical History: Past Medical History:  Diagnosis Date  . Anemia   . Anxiety   . Diabetes mellitus without complication ([place])   . GERD (gastroesophageal reflux disease)    has resolved  . IBS (irritable bowel syndrome)   . Ovarian cyst   . Peritonitis, acute generalized ([place])   . Sepsis ([place])     Past obstetric history: OB History  Gravida Para Term Preterm AB Living  3 2 1 1  0 2  SAB TAB Ectopic Multiple Live Births  0 0 0   2    # Outcome Date GA Lbr Len/2nd Weight Sex Delivery Anes PTL Lv  3 Current           2 Term 06/24/08 [redacted]w[redacted]d   F CS-LTranv Spinal N LIV  1 Preterm 04/08/07 [redacted]w[redacted]d   M CS-LTranv Spinal  LIV      Past Surgical History: Past Surgical History:  Procedure Laterality Date  . APPENDECTOMY     ruptured   . CESAREAN SECTION    . OVARIAN CYST DRAINAGE    . SMALL BOWEL REPAIR      Family History: No family history on file.  Social History: Social History   Tobacco Use  . Smoking status: Former Smoker    Packs/day: 0.50    Types: Cigarettes  . Smokeless tobacco: Never Used  Substance Use Topics  . Alcohol use: No  . Drug use: No  Allergies:  Allergies  Allergen Reactions  . Cetirizine & Related   . Toradol [Ketorolac Tromethamine] Hives and Itching  . Contrast Media [Iodinated Diagnostic Agents] Itching    Mild itching after IV contrast on 6/1/7 No urticaria visible No wheezing No angioedema  . Nsaids Rash and Other (See Comments)    None per gi    Meds:  Medications Prior to Admission  Medication Sig Dispense Refill Last Dose  . ACCU-CHEK FASTCLIX LANCETS MISC 1 Device by Percutaneous route 4 (four) times daily. QID as instructed 100 each 12 11/15/2017 at  Unknown time  . acetaminophen (TYLENOL) 500 MG tablet Take 1,000 mg by mouth every 6 (six) hours as needed for moderate pain.   Past Week at Unknown time  . cyclobenzaprine (FLEXERIL) 10 MG tablet Take 1 tablet (10 mg total) by mouth 3 (three) times daily as needed for muscle spasms. 30 tablet 5 11/15/2017 at Unknown time  . ferrous sulfate (FERROUSUL) 325 (65 FE) MG tablet Take 1 tablet (325 mg total) by mouth 3 (three) times daily with meals. 90 tablet 1 11/15/2017 at Unknown time  . gentamicin (GARAMYCIN) 0.3 % ophthalmic solution Place 2 drops into the left eye 3 (three) times daily. 5 mL 0 11/15/2017 at Unknown time  . glyBURIDE (DIABETA) 2.5 MG tablet Take 3 tablets (7.5 mg total) by mouth 2 (two) times daily with a meal. 60 tablet 2 11/15/2017 at Unknown time  . Iron-FA-B Cmp-C-Biot-Probiotic (FUSION PLUS) CAPS Take 1 tablet by mouth daily. 60 capsule 1 11/15/2017 at Unknown time  . metFORMIN (GLUCOPHAGE) 1000 MG tablet Take 1 tablet (1,000 mg total) by mouth 2 (two) times daily with a meal. 60 tablet 3 11/15/2017 at Unknown time  . NIFEdipine (PROCARDIA-XL/ADALAT CC) 30 MG 24 hr tablet Take 2 tablets (60 mg total) by mouth 2 (two) times daily. 60 tablet 2 11/15/2017 at Unknown time  . ondansetron (ZOFRAN ODT) 8 MG disintegrating tablet Take 1 tablet (8 mg total) by mouth every 8 (eight) hours as needed for nausea or vomiting. 30 tablet 1 11/15/2017 at Unknown time  . pantoprazole (PROTONIX) 20 MG tablet Take 1 tablet (20 mg total) by mouth 2 (two) times daily. 60 tablet 1 11/15/2017 at Unknown time  . triamcinolone ointment (KENALOG) 0.1 % Apply 1 application topically 2 (two) times daily. 30 g 0 11/15/2017 at Unknown time    I have reviewed patient's Past Medical Hx, Surgical Hx, Family Hx, Social Hx, medications and allergies.   ROS:  Review of Systems  Constitutional: Negative for fever.  Gastrointestinal: Positive for abdominal pain. Negative for constipation, diarrhea, nausea and vomiting.   Genitourinary: Negative for dysuria.  Neurological: Negative for headaches.   Other systems negative  Physical Exam   Patient Vitals for the past 24 hrs:  BP Temp Temp src Pulse Resp SpO2 Height Weight  11/15/17 2304 - - - - - - 5\' 6"  (1.676 m) 264 lb (119.7 kg)  11/15/17 2258 139/76 98.1 F (36.7 C) Oral (!) 113 18 99 % - -   Constitutional: Well-developed, well-nourished female in no acute distress.  Cardiovascular: normal rate and rhythm Respiratory: normal effort, clear to auscultation bilaterally GI: Abd soft, moderately tender, gravid and large for gestational age, due to polyhydramnios   No rebound or guarding. MS: Extremities nontender, no edema, normal ROM Neurologic: Alert and oriented x 4.  GU: Neg CVAT.  PELVIC EXAM:   Dilation: Closed Effacement (%): 30 Station: -3, Ballotable Exam by:: Hansel Feinstein, CNM  FHT:  Baseline 145 , moderate variability, accelerations present, no decelerations Contractions: q 3-5 mins Irregular    Labs: Results for orders placed or performed during the hospital encounter of 11/15/17 (from the past 24 hour(s))  Urinalysis, Routine w reflex microscopic     Status: Abnormal   Collection Time: 11/15/17 11:01 PM  Result Value Ref Range   Color, Urine YELLOW YELLOW   APPearance CLEAR CLEAR   Specific Gravity, Urine 1.009 1.005 - 1.030   pH 6.0 5.0 - 8.0   Glucose, UA >=500 (A) NEGATIVE mg/dL   Hgb urine dipstick NEGATIVE NEGATIVE   Bilirubin Urine NEGATIVE NEGATIVE   Ketones, ur NEGATIVE NEGATIVE mg/dL   Protein, ur 30 (A) NEGATIVE mg/dL   Nitrite NEGATIVE NEGATIVE   Leukocytes, UA NEGATIVE NEGATIVE   RBC / HPF 0-5 0 - 5 RBC/hpf   WBC, UA 0-5 0 - 5 WBC/hpf   Bacteria, UA RARE (A) NONE SEEN   Squamous Epithelial / LPF 6-30 (A) NONE SEEN   --/--/O POS (01/14 2324)  Imaging:  US Renal  Result Date: 11/16/2017 CLINICAL DATA:  Thirty-six weeks pregnant, right flank pain EXAM: RENAL / URINARY TRACT ULTRASOUND COMPLETE  COMPARISON:  CT 01/12/2017 FINDINGS: Right Kidney: Length: 11.7 cm. Echogenicity within normal limits. Mild to moderate right hydronephrosis. Left Kidney: Length: 12.5 cm. Echogenicity within normal limits. No mass or hydronephrosis visualized. Bladder: Nondistended IMPRESSION: 1. Mild to moderate right hydronephrosis. 2. Normal left kidney Electronically Signed   By: Donavan Foil M.D.   On: 11/16/2017 02:42   US Venous Img Lower Unilateral Right  Result Date: 10/29/2017 CLINICAL DATA:  Right calf pain for 1 week.  [redacted] weeks pregnant. EXAM: RIGHT LOWER EXTREMITY VENOUS DOPPLER ULTRASOUND TECHNIQUE: Gray-scale sonography with graded compression, as well as color Doppler and duplex ultrasound were performed to evaluate the lower extremity deep venous systems from the level of the common femoral vein and including the common femoral, femoral, profunda femoral, popliteal and calf veins including the posterior tibial, peroneal and gastrocnemius veins when visible. The superficial great saphenous vein was also interrogated. Spectral Doppler was utilized to evaluate flow at rest and with distal augmentation maneuvers in the common femoral, femoral and popliteal veins. COMPARISON:  None. FINDINGS: Contralateral Common Femoral Vein: Respiratory phasicity is normal and symmetric with the symptomatic side. No evidence of thrombus. Normal compressibility. Common Femoral Vein: No evidence of thrombus. Normal compressibility, respiratory phasicity and response to augmentation. Saphenofemoral Junction: No evidence of thrombus. Normal compressibility and flow on color Doppler imaging. Profunda Femoral Vein: No evidence of thrombus. Normal compressibility and flow on color Doppler imaging. Femoral Vein: No evidence of thrombus. Normal compressibility, respiratory phasicity and response to augmentation. Popliteal Vein: No evidence of thrombus. Normal compressibility, respiratory phasicity and response to augmentation. Calf Veins:  No evidence of thrombus. Normal compressibility and flow on color Doppler imaging. Superficial Great Saphenous Vein: No evidence of thrombus. Normal compressibility. IMPRESSION: Negative for deep venous thrombosis in right lower extremity. Electronically Signed   By: Markus Daft M.D.   On: 10/29/2017 18:52   [place] Mfm Fetal Bpp Wo Non Stress  Result Date: 11/06/2017 ----------------------------------------------------------------------  OBSTETRICS REPORT                      (Signed Final 11/06/2017 12:02 pm) ---------------------------------------------------------------------- Patient Info  ID #:       [id number]  D.O.B.:  April 28, 1986 (31 yrs)  Name:       Jodi Mcgee               Visit Date: 11/06/2017 11:31 am ---------------------------------------------------------------------- Performed By  Performed By:     Corky Crafts             Ref. Address:     Faculty                    RDMS,RVT  Attending:        Griffin Dakin MD         Location:         [facility]  Referred By:      Mora Bellman MD ---------------------------------------------------------------------- Orders   #  Description                                 Code   1  [place] MFM FETAL BPP WO NON STRESS              76819.01  ----------------------------------------------------------------------   #  Ordered By               Order #        Accession #    Episode #   1  Arlina Robes            [id number]      [id number]     [id number]  ---------------------------------------------------------------------- Indications   [redacted] weeks gestation of pregnancy                Z3A.34   History of cesarean delivery x 2, currently    O21.219   pregnant   Obesity complicating pregnancy, third          O99.213   trimester   Pre-existing diabetes, type 2, in pregnancy,   O24.113   third trimester on metformin and glyburide   Poor obstetric history: Prior fetal            O09.299   macrosomia, antepartum   Late prenatal care,  second trimester           O09.32  ---------------------------------------------------------------------- Fetal Evaluation  Num Of Fetuses:     1  Fetal Heart         145  Rate(bpm):  Cardiac Activity:   Observed  Presentation:       Cephalic  Placenta:           Anterior, above cervical os  Amniotic Fluid  AFI FV:      Subjectively upper-normal  AFI Sum(cm)     %Tile       Largest Pocket(cm)  20.55           77          8.42  RUQ(cm)       RLQ(cm)       LUQ(cm)        LLQ(cm)  7.76          4.37          0              8.42 ---------------------------------------------------------------------- Biophysical Evaluation  Amniotic F.V:   Pocket => 2 cm two         F. Tone:  Observed                  planes  F. Movement:    Observed                   Score:          8/8  F. Breathing:   Observed ---------------------------------------------------------------------- Gestational Age  LMP:           34w 3d        Date:  03/10/17                 EDD:   12/15/17  Best:          34w 3d     Det. By:  LMP  (03/10/17)          EDD:   12/15/17 ---------------------------------------------------------------------- Anatomy  Stomach:               Appears normal, left   Bladder:                Appears normal                         sided  Kidneys:               Appear normal ---------------------------------------------------------------------- Cervix Uterus Adnexa  Cervix  Not visualized (advanced GA >29wks) ---------------------------------------------------------------------- Impression  Intrauterine pregnancy at 34+3 weeks with Type 2 diabetes in  inadequate control. Inpatient evaluation for glycemic control.  History of polyhydramnios  Normal amniotic fluid, with AFI of 20.5cm. MVP is elevated at  8.4cm  BPP 8/8 ---------------------------------------------------------------------- Recommendations  Continue weekly antepartum testing and serial growth  evaluations  ----------------------------------------------------------------------                 Griffin Dakin, MD Electronically Signed Final Report   11/06/2017 12:02 pm ----------------------------------------------------------------------  [place] Mfm Ob Follow Up  Result Date: 10/22/2017 ----------------------------------------------------------------------  OBSTETRICS REPORT                      (Signed Final 10/22/2017 10:14 am) ---------------------------------------------------------------------- Patient Info  ID #:       [id number]                          D.O.B.:  1985/10/27 (31 yrs)  Name:       Jodi Mcgee               Visit Date: 10/22/2017 09:37 am ---------------------------------------------------------------------- Performed By  Performed By:     Berlinda Last          Ref. Address:     Faculty                    RDMS  Attending:        Renella Cunas MD       Location:         [facility]  Referred By:      Vickii Chafe                    CONSTANT MD ---------------------------------------------------------------------- Orders   #  Description                                 Code   1  [place] MFM OB FOLLOW UP  65465.03  ----------------------------------------------------------------------   #  Ordered By               Order #        Accession #    Episode #   [place]            [id number]      [id number]     [id number]  ---------------------------------------------------------------------- Indications   [redacted] weeks gestation of pregnancy                Z3A.62   History of cesarean delivery x 2, currently    O69.219   pregnant   Obesity complicating pregnancy, third          O99.213   trimester   Pre-existing diabetes, type 2, in pregnancy,   O24.113   third trimester on metformin and glyburide   Poor obstetric history: Prior fetal            O09.299   macrosomia, antepartum  ---------------------------------------------------------------------- Fetal Evaluation  Num Of Fetuses:     1   Fetal Heart         136  Rate(bpm):  Cardiac Activity:   Observed  Presentation:       Cephalic  Placenta:           Anterior, above cervical os  P. Cord Insertion:  Visualized  Amniotic Fluid  AFI FV:      Subjectively upper-normal  AFI Sum(cm)     %Tile       Largest Pocket(cm)  23.12           90          9.32  RUQ(cm)       RLQ(cm)       LUQ(cm)        LLQ(cm)  5.04          4.97          3.79           9.32 ---------------------------------------------------------------------- Biometry  BPD:      81.6  mm     G. Age:  32w 6d         57  %    CI:        75.28   %    70 - 86                                                          FL/HC:      22.4   %    19.1 - 21.3  HC:      298.3  mm     G. Age:  33w 0d         32  %    HC/AC:      0.91        0.96 - 1.17  AC:      328.5  mm     G. Age:  36w 5d       > 97  %    FL/BPD:     81.7   %    71 - 87  FL:       66.7  mm     G. Age:  34w 2d         86  %  FL/AC:      20.3   %    20 - 24  Est. FW:    2640  gm    5 lb 13 oz    > 90  % ---------------------------------------------------------------------- Gestational Age  LMP:           32w 2d        Date:  03/10/17                 EDD:   12/15/17  U/S Today:     34w 2d                                        EDD:   12/01/17  Best:          32w 2d     Det. By:  LMP  (03/10/17)          EDD:   12/15/17 ---------------------------------------------------------------------- Anatomy  Cranium:               Appears normal         Aortic Arch:            Previously seen  Cavum:                 Appears normal         Ductal Arch:            Previously seen  Ventricles:            Previously seen        Diaphragm:              Appears normal  Choroid Plexus:        Previously seen        Stomach:                Appears normal, left                                                                        sided  Cerebellum:            Previously seen        Abdomen:                Appears normal  Posterior Fossa:       Previously  seen        Abdominal Wall:         Previously seen  Nuchal Fold:           Not applicable (>28    Cord Vessels:           Previously seen                         wks GA)  Face:                  Profile not well       Kidneys:                Appear normal  visualized  Lips:                  Previously seen        Bladder:                Appears normal  Thoracic:              Appears normal         Spine:                  Previously seen  Heart:                 Previously seen        Upper Extremities:      Previously seen  RVOT:                  Previously seen        Lower Extremities:      Previously seen  LVOT:                  Previously seen  Other:  Fetus appears to be a female. Technically difficult due to maternal          habitus and fetal position. ---------------------------------------------------------------------- Cervix Uterus Adnexa  Cervix  Not visualized (advanced GA >29wks) ---------------------------------------------------------------------- Impression  SIUP at 32+2 weeks  Normal interval anatomy; anatomic survey complete except  for profile  High normal amniotic fluid volume  EFW > 90th %tile; AC > 97th %tile; fetus at risk to be  LGA/macrosomic ---------------------------------------------------------------------- Recommendations  Begin antenatal testing.  Follow-up ultrasound for growth in 4 weeks ----------------------------------------------------------------------                 Renella Cunas, MD Electronically Signed Final Report   10/22/2017 10:14 am ----------------------------------------------------------------------  US Fetal Bpp W/nonstress  Result Date: 11/12/2017 ----------------------------------------------------------------------  OBSTETRICS REPORT                      (Signed Final 11/12/2017 03:25 pm) ---------------------------------------------------------------------- Patient Info  ID #:       [id number]                          D.O.B.:  11-09-1985  (31 yrs)  Name:       Jodi Mcgee               Visit Date: 11/12/2017 10:10 am ---------------------------------------------------------------------- Performed By  Performed By:     Shauna Hugh Day RNC          Ref. Address:     Faculty  Attending:        Verita Schneiders         [facility]  Referred By:      Mora Bellman MD ---------------------------------------------------------------------- Orders   #  Description                                 Code   1  US FETAL BPP W/NONSTRESS                    08657.8  ----------------------------------------------------------------------   #  Ordered By               Order #        Accession #    Episode #   1  Verita Schneiders           [id number]      [id number]     [id number]  ---------------------------------------------------------------------- Indications   [redacted] weeks gestation of pregnancy                Z3A.35   Pre-existing diabetes, type 2, in pregnancy,   O24.113   third trimester   Polyhydramnios, third trimester, antepartum    O40.3XX0   condition or complication, unspecified fetus  ---------------------------------------------------------------------- Fetal Evaluation  Num Of Fetuses:     1  Preg. Location:     Intrauterine  Cardiac Activity:   Observed  Presentation:       Cephalic  Amniotic Fluid  AFI FV:      Polyhydramnios  AFI Sum(cm)     %Tile       Largest Pocket(cm)  27.43           97          9.76  RUQ(cm)       RLQ(cm)       LUQ(cm)        LLQ(cm)  3.31          9.76          9.71           4.65 ---------------------------------------------------------------------- Biophysical Evaluation  Amniotic F.V:   Pocket => 2 cm two         F. Tone:        Observed                  planes  F. Movement:    Observed                   N.S.T:          Reactive  F. Breathing:   Observed                    Score:          10/10 ---------------------------------------------------------------------- Gestational Age  LMP:           35w 2d        Date:  03/10/17                 EDD:   12/15/17  Best:          Barbie Haggis 2d     Det. By:  LMP  (03/10/17)          EDD:   12/15/17 ---------------------------------------------------------------------- Comments  Technically limited exam due to maternal body habitus. ---------------------------------------------------------------------- Impression  IUP at  [redacted]w[redacted]d  Polyhydramnios with AFI 27.43 cm  BPP10/10 ---------------------------------------------------------------------- Recommendations  Continue antenatal testing and fluid checks weekly.  Recommend delivery by 39 weeks if maternal and fetal status  remain reassuring. ----------------------------------------------------------------------               Verita Schneiders, MD Electronically Signed Final Report   11/12/2017 03:25 pm ----------------------------------------------------------------------  US Fetal Bpp W/nonstress  Result Date: 11/09/2017 ----------------------------------------------------------------------  OBSTETRICS REPORT                        (Corrected Final 11/09/2017 08:39                                                                          pm) ---------------------------------------------------------------------- Patient Info  ID #:       [id number]                          D.O.B.:  [birth year] (31 yrs)  Name:       Jodi Mcgee               Visit Date: 10/29/2017 11:59 am ---------------------------------------------------------------------- Performed By  Performed By:     Shauna Hugh Day RNC          Ref. Address:     Faculty  Attending:        Loma Boston DO       [facility]                                                             [facility]  Referred By:      Vickii Chafe                     CONSTANT MD ---------------------------------------------------------------------- Orders   #  Description                                 Code   1  US FETAL BPP W/NONSTRESS                    15176.1  ----------------------------------------------------------------------   #  Ordered By               Order #        Accession #    Episode #   1  Loma Boston            [id number]      [id number]     [id number]  ---------------------------------------------------------------------- Service(s) Provided   US Fetal BPP W NST  16109  ---------------------------------------------------------------------- Indications   [redacted] weeks gestation of pregnancy                Z3A.33   Pre-existing diabetes, type 2, in pregnancy,   O24.113   third trimester  ---------------------------------------------------------------------- Fetal Evaluation  Num Of Fetuses:     1  Preg. Location:     Intrauterine  Cardiac Activity:   Observed  Presentation:       Cephalic  Amniotic Fluid  AFI FV:      Polyhydramnios  AFI Sum(cm)     %Tile       Largest Pocket(cm)  26.51           96          10.3  RUQ(cm)       RLQ(cm)       LUQ(cm)        LLQ(cm)  10.3          3.99          8.2            4.02 ---------------------------------------------------------------------- Biophysical Evaluation  Amniotic F.V:   Pocket => 2 cm two         F. Tone:        Observed                  planes  F. Movement:    Observed                   N.S.T:          Reactive  F. Breathing:   Observed                   Score:          10/10 ---------------------------------------------------------------------- Gestational Age  LMP:           33w 2d        Date:  03/10/17                 EDD:   12/15/17  Best:          33w 2d     Det. By:  LMP  (03/10/17)          EDD:   12/15/17 ---------------------------------------------------------------------- Impression  Polyhydramnios  BPP 10/10 with reactive NST  ---------------------------------------------------------------------- Recommendations  Continue antenatal testing ----------------------------------------------------------------------                        Loma Boston, DO Electronically Signed Corrected Final Report  11/09/2017 08:39 pm ----------------------------------------------------------------------    MAU Course/MDM: I have ordered labs and reviewed results. Renal US ordered NST reviewed and found to be reactive Consult Dr Rosana Hoes with presentation, exam findings and test results.  Treatments in MAU included analgesia.    Rechecked cervix at 0700 >  Closed/50-60%/Ball Has had two doses of Dilaudid One half dose of Flexeril 5mg  12.5mg  of Phenergan  Has rested intermittently  Had slowed UCs down to every 10 min but now UCs are about q90min Dr Rosana Hoes has been down to reassess and will discuss with team Will repeat CBC to see if WBC is climbing Will repeat UA to see if there is hematuria   Assessment: Single IUP at [redacted]w[redacted]d Preterm uterine contractions Abdominal and back pain  Plan: Plan for further evaluation by Dr Rosana Hoes who came to examine patient. MD to follow   Hansel Feinstein CNM, MSN Certified Nurse-Midwife 11/15/2017 11:39 PM

## 2017-11-16 ENCOUNTER — Other Ambulatory Visit: Payer: Self-pay

## 2017-11-16 ENCOUNTER — Inpatient Hospital Stay (HOSPITAL_COMMUNITY): Payer: Medicaid Other

## 2017-11-16 DIAGNOSIS — Z3A35 35 weeks gestation of pregnancy: Secondary | ICD-10-CM | POA: Diagnosis not present

## 2017-11-16 DIAGNOSIS — O4703 False labor before 37 completed weeks of gestation, third trimester: Secondary | ICD-10-CM | POA: Diagnosis not present

## 2017-11-16 DIAGNOSIS — M549 Dorsalgia, unspecified: Secondary | ICD-10-CM

## 2017-11-16 DIAGNOSIS — O9989 Other specified diseases and conditions complicating pregnancy, childbirth and the puerperium: Secondary | ICD-10-CM

## 2017-11-16 DIAGNOSIS — O26893 Other specified pregnancy related conditions, third trimester: Secondary | ICD-10-CM | POA: Diagnosis not present

## 2017-11-16 DIAGNOSIS — R109 Unspecified abdominal pain: Secondary | ICD-10-CM | POA: Diagnosis not present

## 2017-11-16 DIAGNOSIS — O24415 Gestational diabetes mellitus in pregnancy, controlled by oral hypoglycemic drugs: Secondary | ICD-10-CM | POA: Diagnosis not present

## 2017-11-16 DIAGNOSIS — O133 Gestational [pregnancy-induced] hypertension without significant proteinuria, third trimester: Secondary | ICD-10-CM | POA: Diagnosis not present

## 2017-11-16 LAB — URINALYSIS, ROUTINE W REFLEX MICROSCOPIC
Bilirubin Urine: NEGATIVE
GLUCOSE, UA: NEGATIVE mg/dL
HGB URINE DIPSTICK: NEGATIVE
KETONES UR: 5 mg/dL — AB
LEUKOCYTES UA: NEGATIVE
Nitrite: NEGATIVE
PROTEIN: NEGATIVE mg/dL
Specific Gravity, Urine: 1.009 (ref 1.005–1.030)
pH: 6 (ref 5.0–8.0)

## 2017-11-16 LAB — CBC
HCT: 28.4 % — ABNORMAL LOW (ref 36.0–46.0)
Hemoglobin: 8.8 g/dL — ABNORMAL LOW (ref 12.0–15.0)
MCH: 24.9 pg — ABNORMAL LOW (ref 26.0–34.0)
MCHC: 31 g/dL (ref 30.0–36.0)
MCV: 80.5 fL (ref 78.0–100.0)
PLATELETS: 200 10*3/uL (ref 150–400)
RBC: 3.53 MIL/uL — AB (ref 3.87–5.11)
RDW: 15.9 % — AB (ref 11.5–15.5)
WBC: 7.4 10*3/uL (ref 4.0–10.5)

## 2017-11-16 LAB — CBC WITH DIFFERENTIAL/PLATELET
BASOS ABS: 0 10*3/uL (ref 0.0–0.1)
Basophils Relative: 0 %
EOS ABS: 0 10*3/uL (ref 0.0–0.7)
EOS PCT: 0 %
HCT: 29.3 % — ABNORMAL LOW (ref 36.0–46.0)
Hemoglobin: 9.1 g/dL — ABNORMAL LOW (ref 12.0–15.0)
Lymphocytes Relative: 13 %
Lymphs Abs: 1 10*3/uL (ref 0.7–4.0)
MCH: 24.9 pg — AB (ref 26.0–34.0)
MCHC: 31.1 g/dL (ref 30.0–36.0)
MCV: 80.1 fL (ref 78.0–100.0)
MONO ABS: 0.4 10*3/uL (ref 0.1–1.0)
Monocytes Relative: 6 %
Neutro Abs: 6.1 10*3/uL (ref 1.7–7.7)
Neutrophils Relative %: 81 %
Platelets: 184 10*3/uL (ref 150–400)
RBC: 3.66 MIL/uL — AB (ref 3.87–5.11)
RDW: 15.9 % — AB (ref 11.5–15.5)
WBC: 7.5 10*3/uL (ref 4.0–10.5)

## 2017-11-16 LAB — IRON AND TIBC
IRON: 55 ug/dL (ref 28–170)
SATURATION RATIOS: 8 % — AB (ref 10.4–31.8)
TIBC: 679 ug/dL — ABNORMAL HIGH (ref 250–450)
UIBC: 624 ug/dL

## 2017-11-16 LAB — COMPREHENSIVE METABOLIC PANEL
ALT: 17 U/L (ref 14–54)
ANION GAP: 8 (ref 5–15)
AST: 36 U/L (ref 15–41)
Albumin: 2.6 g/dL — ABNORMAL LOW (ref 3.5–5.0)
Alkaline Phosphatase: 110 U/L (ref 38–126)
BUN: 6 mg/dL (ref 6–20)
CO2: 21 mmol/L — AB (ref 22–32)
Calcium: 8.2 mg/dL — ABNORMAL LOW (ref 8.9–10.3)
Chloride: 106 mmol/L (ref 101–111)
Creatinine, Ser: 0.51 mg/dL (ref 0.44–1.00)
Glucose, Bld: 153 mg/dL — ABNORMAL HIGH (ref 65–99)
POTASSIUM: 4.1 mmol/L (ref 3.5–5.1)
SODIUM: 135 mmol/L (ref 135–145)
Total Bilirubin: 1.1 mg/dL (ref 0.3–1.2)
Total Protein: 6.8 g/dL (ref 6.5–8.1)

## 2017-11-16 LAB — PROTEIN / CREATININE RATIO, URINE
Creatinine, Urine: 73 mg/dL
PROTEIN CREATININE RATIO: 0.38 mg/mg{creat} — AB (ref 0.00–0.15)
Total Protein, Urine: 28 mg/dL

## 2017-11-16 LAB — RETICULOCYTES
RBC.: 3.61 MIL/uL — AB (ref 3.87–5.11)
RETIC COUNT ABSOLUTE: 97.5 10*3/uL (ref 19.0–186.0)
Retic Ct Pct: 2.7 % (ref 0.4–3.1)

## 2017-11-16 LAB — TYPE AND SCREEN
ABO/RH(D): O POS
ANTIBODY SCREEN: NEGATIVE

## 2017-11-16 LAB — FERRITIN: Ferritin: 7 ng/mL — ABNORMAL LOW (ref 11–307)

## 2017-11-16 LAB — GLUCOSE, CAPILLARY
GLUCOSE-CAPILLARY: 169 mg/dL — AB (ref 65–99)
GLUCOSE-CAPILLARY: 138 mg/dL — AB (ref 65–99)
GLUCOSE-CAPILLARY: 101 mg/dL — AB (ref 65–99)
Glucose-Capillary: 99 mg/dL (ref 65–99)

## 2017-11-16 LAB — VITAMIN B12: VITAMIN B 12: 250 pg/mL (ref 180–914)

## 2017-11-16 LAB — FOLATE: FOLATE: 14.3 ng/mL (ref >5.9)

## 2017-11-16 MED ORDER — DOCUSATE SODIUM 100 MG PO CAPS: 100.0000 mg | ORAL_CAPSULE | Freq: Two times a day (BID) | ORAL | Status: DC | PRN

## 2017-11-16 MED ORDER — MORPHINE SULFATE (PF) 10 MG/ML IV SOLN
10.0000 mg | Freq: Once | INTRAVENOUS | Status: AC
  Administered 2017-11-16: 10 mg via INTRAMUSCULAR
  Filled 2017-11-16: qty 1

## 2017-11-16 MED ORDER — HYDROMORPHONE HCL 1 MG/ML IJ SOLN
1.0000 mg | Freq: Once | INTRAMUSCULAR | Status: AC
  Administered 2017-11-16: 1 mg via INTRAVENOUS
  Filled 2017-11-16 (×2): qty 1

## 2017-11-16 MED ORDER — LACTATED RINGERS IV SOLN
INTRAVENOUS | Status: DC
  Administered 2017-11-16: 03:00:00 via INTRAVENOUS

## 2017-11-16 MED ORDER — LACTATED RINGERS IV BOLUS (SEPSIS)
1000.0000 mL | Freq: Once | INTRAVENOUS | Status: AC
  Administered 2017-11-16: 1000 mL via INTRAVENOUS

## 2017-11-16 MED ORDER — PANTOPRAZOLE SODIUM 20 MG PO TBEC
20.0000 mg | DELAYED_RELEASE_TABLET | Freq: Two times a day (BID) | ORAL | Status: DC
  Administered 2017-11-16: 20 mg via ORAL
  Filled 2017-11-16 (×3): qty 1

## 2017-11-16 MED ORDER — HYDROMORPHONE HCL 1 MG/ML IJ SOLN
1.0000 mg | Freq: Once | INTRAMUSCULAR | Status: AC
  Administered 2017-11-16: 1 mg via INTRAVENOUS
  Filled 2017-11-16: qty 1

## 2017-11-16 MED ORDER — LACTATED RINGERS IV SOLN
INTRAVENOUS | Status: DC
  Administered 2017-11-16 – 2017-11-17 (×3): via INTRAVENOUS

## 2017-11-16 MED ORDER — PRENATAL MULTIVITAMIN CH: 1.0000 | ORAL_TABLET | Freq: Every day | ORAL | Status: DC

## 2017-11-16 MED ORDER — TIZANIDINE HCL 2 MG PO TABS
2.0000 mg | ORAL_TABLET | Freq: Once | ORAL | Status: DC
  Filled 2017-11-16: qty 1

## 2017-11-16 MED ORDER — CYCLOBENZAPRINE HCL 5 MG PO TABS
5.0000 mg | ORAL_TABLET | Freq: Once | ORAL | Status: AC
  Administered 2017-11-16: 5 mg via ORAL
  Filled 2017-11-16: qty 1

## 2017-11-16 MED ORDER — METFORMIN HCL 500 MG PO TABS
1000.0000 mg | ORAL_TABLET | Freq: Two times a day (BID) | ORAL | Status: DC
  Administered 2017-11-16 – 2017-11-17 (×2): 1000 mg via ORAL
  Filled 2017-11-16 (×2): qty 2

## 2017-11-16 MED ORDER — HYDROMORPHONE HCL 1 MG/ML IJ SOLN
0.5000 mg | Freq: Once | INTRAMUSCULAR | Status: AC
  Administered 2017-11-16: 0.5 mg via INTRAVENOUS
  Filled 2017-11-16: qty 1

## 2017-11-16 MED ORDER — TIZANIDINE HCL 2 MG PO TABS
2.0000 mg | ORAL_TABLET | Freq: Four times a day (QID) | ORAL | Status: DC | PRN
  Administered 2017-11-16 – 2017-11-17 (×2): 2 mg via ORAL
  Filled 2017-11-16 (×3): qty 1

## 2017-11-16 MED ORDER — PROMETHAZINE HCL 25 MG/ML IJ SOLN
12.5000 mg | Freq: Once | INTRAMUSCULAR | Status: AC
  Administered 2017-11-16: 12.5 mg via INTRAVENOUS
  Filled 2017-11-16: qty 1

## 2017-11-16 MED ORDER — ACETAMINOPHEN 325 MG PO TABS: 650.0000 mg | ORAL_TABLET | ORAL | Status: DC | PRN

## 2017-11-16 MED ORDER — NIFEDIPINE ER OSMOTIC RELEASE 30 MG PO TB24: 60.0000 mg | ORAL_TABLET | Freq: Two times a day (BID) | ORAL | Status: DC

## 2017-11-16 MED ORDER — GLYBURIDE 2.5 MG PO TABS
7.5000 mg | ORAL_TABLET | Freq: Two times a day (BID) | ORAL | Status: DC
Start: 2017-11-16 — End: 2017-11-17
  Administered 2017-11-17: 7.5 mg via ORAL
  Filled 2017-11-16 (×2): qty 1

## 2017-11-16 MED ORDER — ZOLPIDEM TARTRATE 5 MG PO TABS
5.0000 mg | ORAL_TABLET | Freq: Every evening | ORAL | Status: DC | PRN
  Filled 2017-11-16: qty 1

## 2017-11-16 MED ORDER — PROMETHAZINE HCL 25 MG/ML IJ SOLN
25.0000 mg | Freq: Once | INTRAMUSCULAR | Status: AC
  Administered 2017-11-16: 25 mg via INTRAVENOUS
  Filled 2017-11-16 (×2): qty 1

## 2017-11-16 MED ORDER — NIFEDIPINE 10 MG PO CAPS
10.0000 mg | ORAL_CAPSULE | Freq: Once | ORAL | Status: AC
  Administered 2017-11-16: 10 mg via ORAL
  Filled 2017-11-16: qty 1

## 2017-11-16 NOTE — Progress Notes (Signed)
Dr. Ilda Basset on unit to assess Patient. Dr. Ilda Basset made aware of blood pressure of 156/95. Continue to monitor blood pressures per Dr. Ilda Basset.

## 2017-11-16 NOTE — Progress Notes (Signed)
OB Note  S: pt still with right low back pain. Pt states it started yesterday when she was sitting down at work, no h/o prior similar pain, non radiating and feels sharp, no fevers, chills, nausea, vomiting, dysuria, hematuria, decreased FM, VB or LOF and doesn't feel like uterine pains.  O: AF VS normal and stable.  Laying or her left side. Patient appears somewhat anxious and tired SVE: closed maybe FT/long/high, no blood on glove No CVAT Has back discomfort when lifting her right leg and pressing her foot against resistance. Negative on the left side. Abdomen: obese, gravid, benign  Category I Toco negative (adjusted in room)  A/p: pt stable Appears more musculoskeletal. Will try IM morphine 10 x 1 and IV phenergan 25 x 1 more therapeutic rest D/w pt no indication for delivery, given reassuring fetal status and no e/o labor.  Continuous EFM for the time being  Durene Romans MD Attending Center for [employer] (Faculty Practice) 11/16/2017 Time: 10am

## 2017-11-16 NOTE — Progress Notes (Signed)
Dr. Ilda Basset on unit to assess Patient. Dr. Ilda Basset made aware that Patient has not had anything to eat and Patient has schedule CBG and diabetic medications. Okay to hold medications if Patient is not eating and check CBG every 6 hours if Patient is not eating per Dr. Ilda Basset.

## 2017-11-16 NOTE — Progress Notes (Signed)
OB Update Note  Patient has been sleeping and not up to bathroom to void. Still states she has some low back pain  Patient Vitals for the past 24 hrs:  BP Temp Temp src Pulse Resp SpO2 Height Weight  11/16/17 1200 (!) 156/95 98 F (36.7 C) Oral 93 20 100 % - -  11/16/17 1001 (!) 145/71 98.7 F (37.1 C) Oral (!) 109 (!) 22 100 % - -  11/16/17 0950 (!) 144/84 98.5 F (36.9 C) - 99 - - - -  11/16/17 0129 - 98.3 F (36.8 C) Oral - - - - -  11/16/17 0039 125/70 - - (!) 104 - - - -  11/16/17 0030 132/81 - - 99 - - - -  11/16/17 0016 117/80 - - (!) 103 - - - -  11/16/17 0000 133/79 - - (!) 112 - - - -  11/15/17 2357 140/79 - - (!) 109 - 99 % - -  11/15/17 2304 - - - - - - 5\' 6"  (1.676 m) 264 lb (119.7 kg)  11/15/17 2258 139/76 98.1 F (36.7 C) Oral (!) 113 18 99 % - -   EFM: category I with accels but was tracing maternal for awhile before pt off to go to bathroom Toco: ? q79m  A/p: pt stable to improved *low back pain: If can get 5 mins of fetal tracing then okay to come off and do NST around 1830 and then one later in the shift *new gHTN?: Will get a PC ratio and only take BPs with heart level either flat or semi fowlers; RN confirms that her BPs while on the ward have been appropriately taken, which means she most likely has gHTN dx.  Pt declined her po procardia this morning which she was started on at her last ap admission for symptomatic UCs and I will d/c this so as not to mask any elevated BPs. Unclear if she was taking the procardia xl as an outpatient. Cmp, cbc from this morning shows no s/s of pre-x. D/w RN and she will make sure that future BPs continue to be appropriately taken  Durene Romans MD Attending Center for [place] (Faculty Practice) 11/16/2017 Time: [phone]

## 2017-11-16 NOTE — H&P (Signed)
Obstetric History and Physical  Jodi Mcgee is a 32 y.o. [id number] with IUP at [redacted]w[redacted]d presenting for abdominal pain. She has been having contractions for which she takes procardia, but reports her pain worsened tonight. She took her procardia as instructed but it did not improve. States she does feel her contractions but they are her usual mild and the pain she is having is much worse, starts in right groin and radiates to right flank. She is mostly concerned about her right flank pain. Denies fever, chills, nausea, vomiting, dysuria. Denies leaking or bleeding. Reports normal fetal movements.   Prenatal Course Patient Active Problem List   Diagnosis Date Noted  . Non-compliant pregnant patient, third trimester 11/05/2017  . Diabetes mellitus in pregnancy, antepartum 11/05/2017  . Pelvic adhesive disease 10/22/2017  . LGA (large for gestational age) fetus affecting management of mother 10/22/2017  . Anemia affecting pregnancy in third trimester 10/11/2017  . Obesity (BMI 30-39.9) 09/25/2017  . Transient hypertension of pregnancy 09/13/2017  . Unwanted fertility 08/30/2017  . Previous cesarean section complicating pregnancy 28/31/5176  . Supervision of high risk pregnancy, antepartum, third trimester 08/23/2017  . Insufficient prenatal care 08/23/2017  . Type 2 diabetes mellitus complicating pregnancy, antepartum 08/23/2017  . Chronic pain syndrome 08/23/2017   Prenatal Transfer Tool   Past Medical History:  Diagnosis Date  . Anemia   . Anxiety   . Diabetes mellitus without complication ([place])   . GERD (gastroesophageal reflux disease)    has resolved  . IBS (irritable bowel syndrome)   . Ovarian cyst   . Peritonitis, acute generalized ([place])   . Sepsis [facility])     Past Surgical History:  Procedure Laterality Date  . APPENDECTOMY     ruptured   . CESAREAN SECTION    . OVARIAN CYST DRAINAGE    . SMALL BOWEL REPAIR      OB History  Gravida Para Term Preterm AB Living  3  2 1 1  0 2  SAB TAB Ectopic Multiple Live Births  0 0 0   2    # Outcome Date GA Lbr Len/2nd Weight Sex Delivery Anes PTL Lv  3 Current           2 Term 06/24/08 [redacted]w[redacted]d   F CS-LTranv Spinal N LIV  1 Preterm 04/08/07 [redacted]w[redacted]d   M CS-LTranv Spinal  LIV      Social History   Socioeconomic History  . Marital status: Single    Spouse name: None  . Number of children: None  . Years of education: None  . Highest education level: None  Social Needs  . Financial resource strain: None  . Food insecurity - worry: None  . Food insecurity - inability: None  . Transportation needs - medical: None  . Transportation needs - non-medical: None  Occupational History  . None  Tobacco Use  . Smoking status: Former Smoker    Packs/day: 0.50    Types: Cigarettes  . Smokeless tobacco: Never Used  Substance and Sexual Activity  . Alcohol use: No  . Drug use: No  . Sexual activity: None  Other Topics Concern  . None  Social History Narrative   ** Merged History Encounter **        No family history on file.  Medications Prior to Admission  Medication Sig Dispense Refill Last Dose  . ACCU-CHEK FASTCLIX LANCETS MISC 1 Device by Percutaneous route 4 (four) times daily. QID as instructed 100 each 12 11/15/2017 at Unknown  time  . acetaminophen (TYLENOL) 500 MG tablet Take 1,000 mg by mouth every 6 (six) hours as needed for moderate pain.   Past Week at Unknown time  . cyclobenzaprine (FLEXERIL) 10 MG tablet Take 1 tablet (10 mg total) by mouth 3 (three) times daily as needed for muscle spasms. 30 tablet 5 11/15/2017 at Unknown time  . ferrous sulfate (FERROUSUL) 325 (65 FE) MG tablet Take 1 tablet (325 mg total) by mouth 3 (three) times daily with meals. 90 tablet 1 11/15/2017 at Unknown time  . gentamicin (GARAMYCIN) 0.3 % ophthalmic solution Place 2 drops into the left eye 3 (three) times daily. 5 mL 0 11/15/2017 at Unknown time  . glyBURIDE (DIABETA) 2.5 MG tablet Take 3 tablets (7.5 mg total) by  mouth 2 (two) times daily with a meal. 60 tablet 2 11/15/2017 at Unknown time  . Iron-FA-B Cmp-C-Biot-Probiotic (FUSION PLUS) CAPS Take 1 tablet by mouth daily. 60 capsule 1 11/15/2017 at Unknown time  . metFORMIN (GLUCOPHAGE) 1000 MG tablet Take 1 tablet (1,000 mg total) by mouth 2 (two) times daily with a meal. 60 tablet 3 11/15/2017 at Unknown time  . NIFEdipine (PROCARDIA-XL/ADALAT CC) 30 MG 24 hr tablet Take 2 tablets (60 mg total) by mouth 2 (two) times daily. 60 tablet 2 11/15/2017 at Unknown time  . ondansetron (ZOFRAN ODT) 8 MG disintegrating tablet Take 1 tablet (8 mg total) by mouth every 8 (eight) hours as needed for nausea or vomiting. 30 tablet 1 11/15/2017 at Unknown time  . pantoprazole (PROTONIX) 20 MG tablet Take 1 tablet (20 mg total) by mouth 2 (two) times daily. 60 tablet 1 11/15/2017 at Unknown time  . triamcinolone ointment (KENALOG) 0.1 % Apply 1 application topically 2 (two) times daily. 30 g 0 11/15/2017 at Unknown time    Allergies  Allergen Reactions  . Cetirizine & Related   . Toradol [Ketorolac Tromethamine] Hives and Itching  . Contrast Media [Iodinated Diagnostic Agents] Itching    Mild itching after IV contrast on 6/1/7 No urticaria visible No wheezing No angioedema  . Nsaids Rash and Other (See Comments)    None per gi    Review of Systems: Negative except for what is mentioned in HPI.  Physical Exam: BP 125/70   Pulse (!) 104   Temp 98.3 F (36.8 C) (Oral)   Resp 18   Ht 5\' 6"  (1.676 m)   Wt 264 lb (119.7 kg)   LMP 03/10/2017   SpO2 99%   BMI 42.61 kg/m  CONSTITUTIONAL: Well-developed, well-nourished female in mild to moderate distress.  HENT:  Normocephalic, atraumatic, External right and left ear normal. Oropharynx is clear and moist EYES: Conjunctivae and EOM are normal. Pupils are equal, round, and reactive to light. No scleral icterus.  NECK: Normal range of motion, supple, no masses SKIN: Skin is warm and diaphoretic grossly. No rash noted. No  erythema. No pallor. NEUROLOGIC: Alert and oriented to person, place, and time. Normal reflexes, muscle tone coordination. No cranial nerve deficit noted. PSYCHIATRIC: Normal mood and affect. Normal behavior. Normal judgment and thought content. Slightly pressured speech CARDIOVASCULAR: Normal heart rate noted, regular rhythm RESPIRATORY: Effort normal, no problems with respiration noted ABDOMEN: Soft, gravid, non-distended, no contractions palpable on exam, very mildly tender in RLQ, mild to moderate right CVA tenderness MUSCULOSKELETAL: Normal range of motion. No edema and no tenderness. 2+ distal pulses.  Cervical Exam: closed/30/-3 FHT:  Reactive NST Contractions: irregular   Pertinent Labs/Studies:   Results for orders placed or  performed during the hospital encounter of 11/15/17 (from the past 24 hour(s))  Urinalysis, Routine w reflex microscopic     Status: Abnormal   Collection Time: 11/15/17 11:01 PM  Result Value Ref Range   Color, Urine YELLOW YELLOW   APPearance CLEAR CLEAR   Specific Gravity, Urine 1.009 1.005 - 1.030   pH 6.0 5.0 - 8.0   Glucose, UA >=500 (A) NEGATIVE mg/dL   Hgb urine dipstick NEGATIVE NEGATIVE   Bilirubin Urine NEGATIVE NEGATIVE   Ketones, ur NEGATIVE NEGATIVE mg/dL   Protein, ur 30 (A) NEGATIVE mg/dL   Nitrite NEGATIVE NEGATIVE   Leukocytes, UA NEGATIVE NEGATIVE   RBC / HPF 0-5 0 - 5 RBC/hpf   WBC, UA 0-5 0 - 5 WBC/hpf   Bacteria, UA RARE (A) NONE SEEN   Squamous Epithelial / LPF 6-30 (A) NONE SEEN  Glucose, capillary     Status: Abnormal   Collection Time: 11/16/17  1:25 AM  Result Value Ref Range   Glucose-Capillary 169 (H) 65 - 99 mg/dL    Assessment : Sashia ROCKELLE HEUERMAN is a 32 y.o. [id number] at [redacted]w[redacted]d who presents with worsening RLQ and right flank pain. Pregnancy complicated by polyhydramnios, h/o of 2 c-sections and significant intra-abdominal scar tissue s/p open appendectomy for ruptured appendix. She does report contractions similar to  her ongoing contractions but main complaint is right flank pain. Initially, we were not picking up contractions on monitor and cervical exam unchanged from prior exam. Flank pain concerning for a pyelonephritis picture however she is afebrile and without a white count. Kidney stone less likely with no blood noted on UA. She does have mild to moderate right hydronephrosis which may be physiologic. Patient received IV pain medicine and felt much better. We did pick up some regular contractions thereafter that spaced out significantly with fluid bolus. Suspect source of pain is uterine irritability causing stretching in intra-abdominal adhesions.   She also has poorly controlled T2DM on metformin and glyburide, reporting fasting blood sugar as high as 150s in the last few days with her post prandials in the 200s, despite reportedly taking her medications and eating her usual meals.   Plan: Observe in MAU for now, may admit to antepartum for blood glucose control if not in labor. She is NPO for now in event of labor and need to proceed with repeat c-section.    Feliz Beam, M.D. Attending [place], [facility] for [employer], [employer]  11/16/2017, 1:47 AM

## 2017-11-16 NOTE — Progress Notes (Signed)
Dr. Elly Modena notified pt having occas. Variables asked to review strip dr. Adair Laundry unable to review strip cant get into OBIX fluid bolus ordered and orders rec for pain meds. Ordered.

## 2017-11-16 NOTE — Progress Notes (Signed)
Inpatient Diabetes Program Recommendations  Diabetes Treatment Program Recommendations  ADA Standards of Care 2018 Diabetes in Pregnancy Target Glucose Ranges:  Fasting: 60 - 90 mg/dL Preprandial: 60 - 105 mg/dL 1 hr postprandial: Less than 140mg /dL (from first bite of meal) 2 hr postprandial: Less than 120 mg/dL (from first bit of meal)   Lab Results  Component Value Date   GLUCAP 169 (H) 11/16/2017   HGBA1C 6.1 (H) 10/22/2017    Review of Glycemic Control Results for KARCYN, MENN (MRN [id number]) as of 11/16/2017 08:47  Ref. Range 11/16/2017 01:25  Glucose-Capillary Latest Ref Range: 65 - 99 mg/dL 169 (H)   Diabetes history: Type 2 DM Outpatient Diabetes medications: Metformin 1000mg  BID, Glyburide 7.5mg  BIDAC Current orders for Inpatient glycemic control: none  Inpatient Diabetes Program Recommendations:    Patient presenting to MAU with worsening RLQ pain. POC continuing to rule out source. BS at 0100 was 169 mg/dL and per Dr Rosana Hoes' note "may admit for blood glucose control". She has been poorly controlled throughout the pregnancy, despite oral medications.   Spoke with Malachy Mood, RN regarding POC, team still discussing. Verified patient is NPO, recommending additional CBG monitoring.  If admitted and blood sugars exceed >150mg /dL consecutively consider glucostabilizer. Regardless of admission status, patient may need additional insulin requirements, given third trimester insulin needs increase and to best optimize control as a future scheduled CS.   Please reach out for a referral if insulin is required for discharge. Will need teaching for administration. Will follow.   Thanks, Bronson Curb, MSN, RNC-OB Diabetes Coordinator [phone] (8a-5p)

## 2017-11-16 NOTE — Progress Notes (Signed)
Fetal monitor picking up maternal heart rate due to pt siize it is hard to moniotor.

## 2017-11-17 ENCOUNTER — Observation Stay (HOSPITAL_COMMUNITY): Payer: Medicaid Other

## 2017-11-17 DIAGNOSIS — O133 Gestational [pregnancy-induced] hypertension without significant proteinuria, third trimester: Secondary | ICD-10-CM | POA: Diagnosis not present

## 2017-11-17 DIAGNOSIS — Z3A35 35 weeks gestation of pregnancy: Secondary | ICD-10-CM | POA: Diagnosis not present

## 2017-11-17 DIAGNOSIS — O24415 Gestational diabetes mellitus in pregnancy, controlled by oral hypoglycemic drugs: Secondary | ICD-10-CM | POA: Diagnosis not present

## 2017-11-17 DIAGNOSIS — O4703 False labor before 37 completed weeks of gestation, third trimester: Secondary | ICD-10-CM | POA: Diagnosis not present

## 2017-11-17 LAB — GLUCOSE, CAPILLARY: Glucose-Capillary: 111 mg/dL — ABNORMAL HIGH (ref 65–99)

## 2017-11-17 MED ORDER — OXYCODONE-ACETAMINOPHEN 5-325 MG PO TABS
1.0000 | ORAL_TABLET | Freq: Four times a day (QID) | ORAL | Status: DC | PRN
  Administered 2017-11-17: 2 via ORAL
  Filled 2017-11-17: qty 2

## 2017-11-17 MED ORDER — OXYCODONE-ACETAMINOPHEN 5-325 MG PO TABS: 1.0000 | ORAL_TABLET | Freq: Four times a day (QID) | ORAL | 0 refills | Status: DC | PRN

## 2017-11-17 NOTE — Progress Notes (Signed)
2210-2216 picking maternal heart rate instead of fetal heart rate.[place] adjusted  2319-2322 picking up maternal heart rate instead of fetal heart rate   [place] adjusted  0008-0012 picking up maternal heart rate instead of fetal heart rate [place] adjusted 0016-0020 picking up maternal heart rate instead of fetal heart rate [place] adjusted.

## 2017-11-17 NOTE — Progress Notes (Signed)
Pt back to bed back on EFM. Denies pain.

## 2017-11-17 NOTE — Progress Notes (Signed)
FHR with accels, dr. Elly Modena said pt can come off monitor

## 2017-11-17 NOTE — Progress Notes (Signed)
Discharge instructions given to pt. Medications, upcoming appointments, and symptoms to report were all discussed with the pt. Pt verbalized understanding and has no further questions at this time. Pt being wheeled down in stable condition.

## 2017-11-17 NOTE — Discharge Summary (Signed)
OB Discharge Summary     Patient Name: Jodi Mcgee DOB: 03-04-1986 MRN: [id number]  Date of admission: 11/15/2017 Delivering MD: This patient has no babies on file.  Date of discharge: 11/17/2017  Admitting diagnosis: 36 weeks, contraction Intrauterine pregnancy: [redacted]w[redacted]d     Secondary diagnosis:  Active Problems:   Back pain affecting pregnancy in third trimester   Transient hypertension  Additional problems: Gestational hypertension     Discharge diagnosis: back pain in pregnancy and [facility] course:  32 yo G3P1102 at [redacted]w[redacted]d admitted for therapeutic rest. Patient presented with right sided back pain not relieved with flexeril. She was also having contractions which she described as a distinct discomfort from her back pain. She reports good fetal movement and denies leakage of fluid or vaginal bleeding. All laboratory values were normal, renal sono is negative for kidney stone, chest x-ray negative for pneumonia. Patient received 2 doses of morphine and slept most of the day as a result. Pain is likely felt to be musculoskeletal. During her admission, patient was noted to have elevated BP and the diagnosis of GHTN was made. Patient denies HA, visual changes, RUQ/epigastric pain, nausea or emesis. Plan for delivery at 37 weeks. CBGs were also well controlled. Patient discharged with a few tablets of percocet with plans to follow up in the office on Monday as scheduled  Physical exam  Vitals:   11/16/17 1931 11/17/17 0005 11/17/17 0400 11/17/17 0533  BP: (!) 138/94 140/89  140/80  Pulse: 100 (!) 113  (!) 106  Resp: 19 17  17   Temp: 98.2 F (36.8 C) 97.8 F (36.6 C)  98 F (36.7 C)  TempSrc: Oral Oral  Oral  SpO2: 100% 99% 97% 99%  Weight:      Height:       General: alert, cooperative and no distress Uterine Fundus: soft, gravid, NT DVT Evaluation: No evidence of DVT seen on physical exam. Negative Homan's sign. Labs: Lab Results   Component Value Date   WBC 7.5 11/16/2017   HGB 9.1 (L) 11/16/2017   HCT 29.3 (L) 11/16/2017   MCV 80.1 11/16/2017   PLT 184 11/16/2017   CMP Latest Ref Rng & Units 11/16/2017  Glucose 65 - 99 mg/dL 153(H)  BUN 6 - 20 mg/dL 6  Creatinine 0.44 - 1.00 mg/dL 0.51  Sodium 135 - 145 mmol/L 135  Potassium 3.5 - 5.1 mmol/L 4.1  Chloride 101 - 111 mmol/L 106  CO2 22 - 32 mmol/L 21(L)  Calcium 8.9 - 10.3 mg/dL 8.2(L)  Total Protein 6.5 - 8.1 g/dL 6.8  Total Bilirubin 0.3 - 1.2 mg/dL 1.1  Alkaline Phos 38 - 126 U/L 110  AST 15 - 41 U/L 36  ALT 14 - 54 U/L 17    Discharge instruction: per After Visit Summary and "Baby and Me Booklet".  After visit meds:  Allergies as of 11/17/2017      Reactions   Cetirizine & Related Itching, Swelling   Toradol [ketorolac Tromethamine] Hives, Itching   Contrast Media [iodinated Diagnostic Agents] Itching   Mild itching after IV contrast on 6/1/7 No urticaria visible No wheezing No angioedema   Nsaids Rash, Other (See Comments)   None per gi      Medication List    STOP taking these medications  NIFEdipine 30 MG 24 hr tablet Commonly known as:  PROCARDIA-XL/ADALAT CC     TAKE these medications   ACCU-CHEK FASTCLIX LANCETS Misc 1 Device by Percutaneous route 4 (four) times daily. QID as instructed   acetaminophen 500 MG tablet Commonly known as:  TYLENOL Take 1,000 mg by mouth every 6 (six) hours as needed for moderate pain.   cyclobenzaprine 10 MG tablet Commonly known as:  FLEXERIL Take 1 tablet (10 mg total) by mouth 3 (three) times daily as needed for muscle spasms.   ferrous sulfate 325 (65 FE) MG tablet Commonly known as:  FERROUSUL Take 1 tablet (325 mg total) by mouth 3 (three) times daily with meals.   FUSION PLUS Caps Take 1 tablet by mouth daily.   gentamicin 0.3 % ophthalmic solution Commonly known as:  GARAMYCIN Place 2 drops into the left eye 3 (three) times daily.   glyBURIDE 2.5 MG tablet Commonly known as:   DIABETA Take 3 tablets (7.5 mg total) by mouth 2 (two) times daily with a meal.   metFORMIN 1000 MG tablet Commonly known as:  GLUCOPHAGE Take 1 tablet (1,000 mg total) by mouth 2 (two) times daily with a meal.   ondansetron 8 MG disintegrating tablet Commonly known as:  ZOFRAN ODT Take 1 tablet (8 mg total) by mouth every 8 (eight) hours as needed for nausea or vomiting.   oxyCODONE-acetaminophen 5-325 MG tablet Commonly known as:  PERCOCET/ROXICET Take 1-2 tablets by mouth every 6 (six) hours as needed for moderate pain or severe pain.   pantoprazole 20 MG tablet Commonly known as:  PROTONIX Take 1 tablet (20 mg total) by mouth 2 (two) times daily.   triamcinolone ointment 0.1 % Commonly known as:  KENALOG Apply 1 application topically 2 (two) times daily.       Diet: carb modified diet  Activity: Advance as tolerated. Pelvic rest for 6 weeks.   Outpatient follow up: as scheduled Follow up Appt: Future Appointments  Date Time Provider [place]  11/19/2017  8:30 AM WH-MFC [place] 1 WH-MFCUS MFC-US  11/19/2017 10:15 AM WOC-WOCA NST WOC-WOCA WOC  11/19/2017 11:15 AM Sloan Leiter, MD [place]  11/26/2017 10:15 AM WOC-WOCA NST WOC-WOCA WOC  11/26/2017 11:15 AM Aletha Halim, MD [place] WOC  12/03/2017 10:15 AM WOC-WOCA NST WOC-WOCA WOC  12/03/2017 11:15 AM Sloan Leiter, MD WOC-WOCA WOC     11/17/2017 Mora Bellman, MD

## 2017-11-17 NOTE — Progress Notes (Signed)
Pt off monitor to radiology for CXR by [facility]

## 2017-11-17 NOTE — Progress Notes (Signed)
Dr. Elly Modena on floor reviewed fetal monitor strip continue to monitor

## 2017-11-19 ENCOUNTER — Encounter: Payer: Self-pay | Admitting: *Deleted

## 2017-11-19 ENCOUNTER — Ambulatory Visit (INDEPENDENT_AMBULATORY_CARE_PROVIDER_SITE_OTHER): Payer: Medicaid Other | Admitting: Obstetrics and Gynecology

## 2017-11-19 ENCOUNTER — Ambulatory Visit (HOSPITAL_COMMUNITY)
Admission: RE | Admit: 2017-11-19 | Discharge: 2017-11-19 | Disposition: A | Discharge status: Home / Self Care | Payer: Medicaid Other | Source: Ambulatory Visit | Attending: Family Medicine | Admitting: Family Medicine

## 2017-11-19 ENCOUNTER — Ambulatory Visit (INDEPENDENT_AMBULATORY_CARE_PROVIDER_SITE_OTHER): Payer: Medicaid Other | Admitting: *Deleted

## 2017-11-19 ENCOUNTER — Encounter (HOSPITAL_COMMUNITY): Payer: Self-pay

## 2017-11-19 ENCOUNTER — Encounter (HOSPITAL_COMMUNITY): Payer: Self-pay | Admitting: *Deleted

## 2017-11-19 VITALS — BP 136/71 | HR 99 | Wt 269.8 lb

## 2017-11-19 DIAGNOSIS — O24119 Pre-existing diabetes mellitus, type 2, in pregnancy, unspecified trimester: Secondary | ICD-10-CM

## 2017-11-19 DIAGNOSIS — O34219 Maternal care for unspecified type scar from previous cesarean delivery: Secondary | ICD-10-CM

## 2017-11-19 DIAGNOSIS — O403XX0 Polyhydramnios, third trimester, not applicable or unspecified: Secondary | ICD-10-CM

## 2017-11-19 DIAGNOSIS — O133 Gestational [pregnancy-induced] hypertension without significant proteinuria, third trimester: Secondary | ICD-10-CM

## 2017-11-19 DIAGNOSIS — O09293 Supervision of pregnancy with other poor reproductive or obstetric history, third trimester: Secondary | ICD-10-CM | POA: Insufficient documentation

## 2017-11-19 DIAGNOSIS — O3660X0 Maternal care for excessive fetal growth, unspecified trimester, not applicable or unspecified: Secondary | ICD-10-CM

## 2017-11-19 DIAGNOSIS — O24113 Pre-existing diabetes mellitus, type 2, in pregnancy, third trimester: Secondary | ICD-10-CM

## 2017-11-19 DIAGNOSIS — O99213 Obesity complicating pregnancy, third trimester: Secondary | ICD-10-CM | POA: Insufficient documentation

## 2017-11-19 DIAGNOSIS — O99013 Anemia complicating pregnancy, third trimester: Secondary | ICD-10-CM

## 2017-11-19 DIAGNOSIS — Z3A36 36 weeks gestation of pregnancy: Secondary | ICD-10-CM

## 2017-11-19 DIAGNOSIS — O0993 Supervision of high risk pregnancy, unspecified, third trimester: Secondary | ICD-10-CM

## 2017-11-19 DIAGNOSIS — Z3009 Encounter for other general counseling and advice on contraception: Secondary | ICD-10-CM

## 2017-11-19 DIAGNOSIS — Z7984 Long term (current) use of oral hypoglycemic drugs: Principal | ICD-10-CM

## 2017-11-19 NOTE — Progress Notes (Signed)
Pt denies H/A or visual disturbances.  [place] for growth and BPP done today.  C/S scheduled on 2/2.

## 2017-11-19 NOTE — Progress Notes (Signed)
Pt stated she is having increased physical discomfort. She works 40 hours per week and requested to be out of work for this week prior to scheduled C/S on 2/2. Pt's request discussed w/Dr. Rosana Hoes who denied pt's request to stop work completely. She agreed to pt working 4 hrs daily for the remainder of this week. Letter given to pt for employer.

## 2017-11-19 NOTE — Progress Notes (Signed)
   PRENATAL VISIT NOTE  Subjective:  Jodi Mcgee is a 32 y.o. [id number] at [redacted]w[redacted]d being seen today for ongoing prenatal care.  She is currently monitored for the following issues for this high-risk pregnancy and has Previous cesarean section complicating pregnancy; Supervision of high risk pregnancy, antepartum, third trimester; Insufficient prenatal care; Type 2 diabetes mellitus complicating pregnancy, antepartum; Chronic pain syndrome; Unwanted fertility; Transient hypertension of pregnancy; Obesity (BMI 30-39.9); Polyhydramnios affecting pregnancy in third trimester; Anemia affecting pregnancy in third trimester; Pelvic adhesive disease; LGA (large for gestational age) fetus affecting management of mother; Non-compliant pregnant patient, third trimester; Diabetes mellitus in pregnancy, antepartum; Back pain affecting pregnancy in third trimester; and Transient hypertension on their problem list.  Patient reports contractions like usual, still with some abdominal pain but not as bad.  Contractions: Irregular. Vag. Bleeding: None.  Movement: Present. Denies leaking of fluid.   T2DM on metformin 1000 mg BID, glyburide 7.5 mg BID FG: 98, 101 PP: 158-160, none in 200s  The following portions of the patient's history were reviewed and updated as appropriate: allergies, current medications, past family history, past medical history, past social history, past surgical history and problem list. Problem list updated.  Objective:   Vitals:   11/19/17 1032  BP: 136/71  Pulse: 99  Weight: 269 lb 12.8 oz (122.4 kg)    Fetal Status: Fetal Heart Rate (bpm): NST   Movement: Present     General:  Alert, oriented and cooperative. Patient is in no acute distress.  Skin: Skin is warm and dry. No rash noted.   Cardiovascular: Normal heart rate noted  Respiratory: Normal respiratory effort, no problems with respiration noted  Abdomen: Soft, gravid, appropriate for gestational age.  Pain/Pressure:  Present     Pelvic: Cervical exam deferred        Extremities: Normal range of motion.     Mental Status:  Normal mood and affect. Normal behavior. Normal judgment and thought content.   Assessment and Plan:  Pregnancy: G3P1102 at [redacted]w[redacted]d  1. Transient hypertension of pregnancy in third trimester Reviewed s/s to come to MAU  2. Type 2 diabetes mellitus complicating pregnancy, antepartum On metformin and glyburide FG elevated, post prandial elevated Reports she has not been following her diet, reports food is not her issues, she drinks a lot of juice, and has been drinking juice because she is "depressed" Will not increase meds, reviewed importance of following diet Patient reports she will follow diet better NST today non-reactive BPP 8/8 today at [facility]  3. Previous cesarean section complicating pregnancy Scheduled for RCS at 37 weeks on 11/24/17  4. Supervision of high risk pregnancy, antepartum, third trimester  5. Unwanted fertility Does not want BTL Wants IUD  6. Polyhydramnios affecting pregnancy in third trimester AFI today 30.4  7. Anemia affecting pregnancy in third trimester  8. Excessive fetal growth affecting management of pregnancy, antepartum, single or unspecified fetus EFW 11/19/17 4405 gms, > 90th%tile  Pre-term labor symptoms and general obstetric precautions including but not limited to vaginal bleeding, contractions, leaking of fluid and fetal movement were reviewed in detail with the patient. Please refer to After Visit Summary for other counseling recommendations.  Return in about 3 weeks (around 12/10/2017) for Wound check;  then 5 weeks for PP visit.  C/S  scheduled on 2/2.   Sloan Leiter, MD

## 2017-11-20 ENCOUNTER — Inpatient Hospital Stay (HOSPITAL_COMMUNITY): Payer: Medicaid Other | Admitting: Anesthesiology

## 2017-11-20 ENCOUNTER — Encounter (HOSPITAL_COMMUNITY): Payer: Self-pay

## 2017-11-20 ENCOUNTER — Encounter: Payer: Self-pay | Admitting: *Deleted

## 2017-11-20 ENCOUNTER — Encounter (HOSPITAL_COMMUNITY)
Admission: AD | Disposition: A | Discharge status: Home / Self Care | Payer: Self-pay | Source: Ambulatory Visit | Attending: Obstetrics & Gynecology

## 2017-11-20 ENCOUNTER — Inpatient Hospital Stay (HOSPITAL_COMMUNITY)
Admission: AD | Admit: 2017-11-20 | Discharge: 2017-11-23 | DRG: 788 | Disposition: A | Discharge status: Home / Self Care | Payer: Medicaid Other | Source: Ambulatory Visit | Attending: Obstetrics & Gynecology | Admitting: Obstetrics & Gynecology

## 2017-11-20 ENCOUNTER — Inpatient Hospital Stay (HOSPITAL_COMMUNITY)
Admission: RE | Admit: 2017-11-20 | Payer: Medicaid Other | Source: Ambulatory Visit | Admitting: Obstetrics and Gynecology

## 2017-11-20 DIAGNOSIS — O3663X0 Maternal care for excessive fetal growth, third trimester, not applicable or unspecified: Secondary | ICD-10-CM | POA: Diagnosis present

## 2017-11-20 DIAGNOSIS — Z3A36 36 weeks gestation of pregnancy: Secondary | ICD-10-CM

## 2017-11-20 DIAGNOSIS — O34211 Maternal care for low transverse scar from previous cesarean delivery: Secondary | ICD-10-CM | POA: Diagnosis not present

## 2017-11-20 DIAGNOSIS — O3660X0 Maternal care for excessive fetal growth, unspecified trimester, not applicable or unspecified: Secondary | ICD-10-CM | POA: Diagnosis present

## 2017-11-20 DIAGNOSIS — R51 Headache: Secondary | ICD-10-CM | POA: Diagnosis present

## 2017-11-20 DIAGNOSIS — O9902 Anemia complicating childbirth: Secondary | ICD-10-CM | POA: Diagnosis present

## 2017-11-20 DIAGNOSIS — O99214 Obesity complicating childbirth: Secondary | ICD-10-CM | POA: Diagnosis present

## 2017-11-20 DIAGNOSIS — N736 Female pelvic peritoneal adhesions (postinfective): Secondary | ICD-10-CM | POA: Diagnosis present

## 2017-11-20 DIAGNOSIS — Z98891 History of uterine scar from previous surgery: Secondary | ICD-10-CM

## 2017-11-20 DIAGNOSIS — Z7984 Long term (current) use of oral hypoglycemic drugs: Secondary | ICD-10-CM

## 2017-11-20 DIAGNOSIS — Z87891 Personal history of nicotine dependence: Secondary | ICD-10-CM

## 2017-11-20 DIAGNOSIS — D649 Anemia, unspecified: Secondary | ICD-10-CM | POA: Diagnosis present

## 2017-11-20 DIAGNOSIS — E119 Type 2 diabetes mellitus without complications: Secondary | ICD-10-CM | POA: Diagnosis present

## 2017-11-20 DIAGNOSIS — O34219 Maternal care for unspecified type scar from previous cesarean delivery: Secondary | ICD-10-CM

## 2017-11-20 DIAGNOSIS — O1415 Severe pre-eclampsia, complicating the puerperium: Secondary | ICD-10-CM | POA: Diagnosis present

## 2017-11-20 DIAGNOSIS — O2412 Pre-existing diabetes mellitus, type 2, in childbirth: Secondary | ICD-10-CM | POA: Diagnosis present

## 2017-11-20 DIAGNOSIS — O403XX0 Polyhydramnios, third trimester, not applicable or unspecified: Secondary | ICD-10-CM | POA: Diagnosis present

## 2017-11-20 DIAGNOSIS — O1414 Severe pre-eclampsia complicating childbirth: Secondary | ICD-10-CM | POA: Diagnosis present

## 2017-11-20 DIAGNOSIS — Z3009 Encounter for other general counseling and advice on contraception: Secondary | ICD-10-CM

## 2017-11-20 DIAGNOSIS — O34212 Maternal care for vertical scar from previous cesarean delivery: Secondary | ICD-10-CM | POA: Diagnosis present

## 2017-11-20 DIAGNOSIS — O0993 Supervision of high risk pregnancy, unspecified, third trimester: Secondary | ICD-10-CM

## 2017-11-20 DIAGNOSIS — O24119 Pre-existing diabetes mellitus, type 2, in pregnancy, unspecified trimester: Secondary | ICD-10-CM

## 2017-11-20 LAB — CBC WITH DIFFERENTIAL/PLATELET
BASOS ABS: 0 10*3/uL (ref 0.0–0.1)
Basophils Relative: 0 %
Eosinophils Absolute: 0 10*3/uL (ref 0.0–0.7)
Eosinophils Relative: 1 %
HEMATOCRIT: 25.9 % — AB (ref 36.0–46.0)
HEMOGLOBIN: 8 g/dL — AB (ref 12.0–15.0)
LYMPHS PCT: 16 %
Lymphs Abs: 0.7 10*3/uL (ref 0.7–4.0)
MCH: 24.1 pg — ABNORMAL LOW (ref 26.0–34.0)
MCHC: 30.9 g/dL (ref 30.0–36.0)
MCV: 78 fL (ref 78.0–100.0)
Monocytes Absolute: 0.2 10*3/uL (ref 0.1–1.0)
Monocytes Relative: 5 %
NEUTROS ABS: 3.5 10*3/uL (ref 1.7–7.7)
NEUTROS PCT: 78 %
Platelets: 162 10*3/uL (ref 150–400)
RBC: 3.32 MIL/uL — ABNORMAL LOW (ref 3.87–5.11)
RDW: 15.5 % (ref 11.5–15.5)
WBC: 4.4 10*3/uL (ref 4.0–10.5)

## 2017-11-20 LAB — URINALYSIS, ROUTINE W REFLEX MICROSCOPIC
BILIRUBIN URINE: NEGATIVE
Glucose, UA: NEGATIVE mg/dL
Hgb urine dipstick: NEGATIVE
Ketones, ur: NEGATIVE mg/dL
Leukocytes, UA: NEGATIVE
NITRITE: NEGATIVE
PH: 7 (ref 5.0–8.0)
Protein, ur: NEGATIVE mg/dL
SPECIFIC GRAVITY, URINE: 1.005 (ref 1.005–1.030)

## 2017-11-20 LAB — COMPREHENSIVE METABOLIC PANEL
ALT: 16 U/L (ref 14–54)
AST: 20 U/L (ref 15–41)
Albumin: 2.3 g/dL — ABNORMAL LOW (ref 3.5–5.0)
Alkaline Phosphatase: 108 U/L (ref 38–126)
Anion gap: 8 (ref 5–15)
BUN: 6 mg/dL (ref 6–20)
CHLORIDE: 107 mmol/L (ref 101–111)
CO2: 21 mmol/L — ABNORMAL LOW (ref 22–32)
CREATININE: 0.73 mg/dL (ref 0.44–1.00)
Calcium: 7.8 mg/dL — ABNORMAL LOW (ref 8.9–10.3)
GFR calc Af Amer: >60 mL/min (ref >60)
Glucose, Bld: 212 mg/dL — ABNORMAL HIGH (ref 65–99)
Potassium: 2.8 mmol/L — ABNORMAL LOW (ref 3.5–5.1)
Sodium: 136 mmol/L (ref 135–145)
TOTAL PROTEIN: 6.3 g/dL — AB (ref 6.5–8.1)
Total Bilirubin: 0.2 mg/dL — ABNORMAL LOW (ref 0.3–1.2)

## 2017-11-20 LAB — PREPARE RBC (CROSSMATCH)

## 2017-11-20 LAB — PROTEIN / CREATININE RATIO, URINE
CREATININE, URINE: 48 mg/dL
Protein Creatinine Ratio: 0.42 mg/mg{Cre} — ABNORMAL HIGH (ref 0.00–0.15)
Total Protein, Urine: 20 mg/dL

## 2017-11-20 SURGERY — Surgical Case
Anesthesia: Spinal | Laterality: Bilateral

## 2017-11-20 MED ORDER — OXYTOCIN 10 UNIT/ML IJ SOLN
INTRAMUSCULAR | Status: AC
  Filled 2017-11-20: qty 4

## 2017-11-20 MED ORDER — ONDANSETRON HCL 4 MG/2ML IJ SOLN
INTRAMUSCULAR | Status: AC
  Filled 2017-11-20: qty 2

## 2017-11-20 MED ORDER — DEXTROSE 5 % IV SOLN
INTRAVENOUS | Status: AC
  Filled 2017-11-20: qty 3000

## 2017-11-20 MED ORDER — MAGNESIUM SULFATE BOLUS VIA INFUSION
4.0000 g | Freq: Once | INTRAVENOUS | Status: AC
  Administered 2017-11-20: 4 g via INTRAVENOUS
  Filled 2017-11-20: qty 500

## 2017-11-20 MED ORDER — PHENYLEPHRINE 40 MCG/ML (10ML) SYRINGE FOR IV PUSH (FOR BLOOD PRESSURE SUPPORT)
PREFILLED_SYRINGE | INTRAVENOUS | Status: AC
  Filled 2017-11-20: qty 20

## 2017-11-20 MED ORDER — ONDANSETRON HCL 4 MG/2ML IJ SOLN
INTRAMUSCULAR | Status: DC | PRN
  Administered 2017-11-20: 4 mg via INTRAVENOUS

## 2017-11-20 MED ORDER — TRANEXAMIC ACID 1000 MG/10ML IV SOLN
1000.0000 mg | INTRAVENOUS | Status: AC
  Administered 2017-11-20: 1000 mg via INTRAVENOUS
  Filled 2017-11-20: qty 10

## 2017-11-20 MED ORDER — BUPIVACAINE HCL (PF) 0.5 % IJ SOLN
INTRAMUSCULAR | Status: DC | PRN
  Administered 2017-11-20: 10 mL

## 2017-11-20 MED ORDER — HYDROMORPHONE HCL 1 MG/ML IJ SOLN
INTRAMUSCULAR | Status: AC
  Filled 2017-11-20: qty 0.5

## 2017-11-20 MED ORDER — PHENYLEPHRINE HCL 10 MG/ML IJ SOLN
INTRAMUSCULAR | Status: DC | PRN
  Administered 2017-11-20: 80 ug via INTRAVENOUS
  Administered 2017-11-20: 40 ug via INTRAVENOUS
  Administered 2017-11-20: 80 ug via INTRAVENOUS

## 2017-11-20 MED ORDER — DIPHENHYDRAMINE HCL 50 MG/ML IJ SOLN
INTRAMUSCULAR | Status: DC | PRN
  Administered 2017-11-20: 25 mg via INTRAVENOUS

## 2017-11-20 MED ORDER — SODIUM CHLORIDE 0.9 % IV SOLN
Freq: Once | INTRAVENOUS | Status: AC
  Administered 2017-11-20: 21:00:00 via INTRAVENOUS

## 2017-11-20 MED ORDER — SCOPOLAMINE 1 MG/3DAYS TD PT72
MEDICATED_PATCH | TRANSDERMAL | Status: AC
  Filled 2017-11-20: qty 1

## 2017-11-20 MED ORDER — BUPIVACAINE IN DEXTROSE 0.75-8.25 % IT SOLN
INTRATHECAL | Status: DC | PRN
  Administered 2017-11-20: 1.5 mL via INTRATHECAL

## 2017-11-20 MED ORDER — MORPHINE SULFATE (PF) 0.5 MG/ML IJ SOLN
INTRAMUSCULAR | Status: AC
  Filled 2017-11-20: qty 10

## 2017-11-20 MED ORDER — SCOPOLAMINE 1 MG/3DAYS TD PT72
MEDICATED_PATCH | TRANSDERMAL | Status: DC | PRN
  Administered 2017-11-20: 1 via TRANSDERMAL

## 2017-11-20 MED ORDER — PHENYLEPHRINE 8 MG IN D5W 100 ML (0.08MG/ML) PREMIX OPTIME
INJECTION | INTRAVENOUS | Status: AC
  Filled 2017-11-20: qty 100

## 2017-11-20 MED ORDER — SODIUM CHLORIDE 0.9 % IR SOLN
Status: DC | PRN
  Administered 2017-11-20: 1

## 2017-11-20 MED ORDER — SODIUM CHLORIDE 0.9 % IV SOLN: Freq: Once | INTRAVENOUS | Status: DC

## 2017-11-20 MED ORDER — CEFAZOLIN SODIUM 10 G IJ SOLR
INTRAMUSCULAR | Status: AC
  Filled 2017-11-20: qty 3000

## 2017-11-20 MED ORDER — HYDROMORPHONE HCL 1 MG/ML IJ SOLN
0.2500 mg | INTRAMUSCULAR | Status: DC | PRN
  Administered 2017-11-20 (×4): 0.25 mg via INTRAVENOUS

## 2017-11-20 MED ORDER — DIPHENHYDRAMINE HCL 50 MG/ML IJ SOLN
INTRAMUSCULAR | Status: AC
  Filled 2017-11-20: qty 1

## 2017-11-20 MED ORDER — MORPHINE SULFATE (PF) 0.5 MG/ML IJ SOLN
INTRAMUSCULAR | Status: DC | PRN
  Administered 2017-11-20: .2 mg via INTRATHECAL

## 2017-11-20 MED ORDER — HYDRALAZINE HCL 20 MG/ML IJ SOLN
10.0000 mg | Freq: Once | INTRAMUSCULAR | Status: DC | PRN
  Filled 2017-11-20: qty 0.5

## 2017-11-20 MED ORDER — PHENYLEPHRINE 8 MG IN D5W 100 ML (0.08MG/ML) PREMIX OPTIME
INJECTION | INTRAVENOUS | Status: DC | PRN
  Administered 2017-11-20: 40 ug/min via INTRAVENOUS

## 2017-11-20 MED ORDER — FENTANYL CITRATE (PF) 100 MCG/2ML IJ SOLN
INTRAMUSCULAR | Status: AC
  Filled 2017-11-20: qty 2

## 2017-11-20 MED ORDER — MAGNESIUM SULFATE 40 G IN LACTATED RINGERS - SIMPLE
2.0000 g/h | INTRAVENOUS | Status: DC
  Administered 2017-11-20: 2 g/h via INTRAVENOUS
  Filled 2017-11-20: qty 500

## 2017-11-20 MED ORDER — BUPIVACAINE HCL (PF) 0.5 % IJ SOLN
INTRAMUSCULAR | Status: AC
  Filled 2017-11-20: qty 30

## 2017-11-20 MED ORDER — LACTATED RINGERS IV SOLN
INTRAVENOUS | Status: DC
  Administered 2017-11-20: 19:00:00 via INTRAVENOUS

## 2017-11-20 MED ORDER — SODIUM BICARBONATE 8.4 % IV SOLN
INTRAVENOUS | Status: DC | PRN
  Administered 2017-11-20: 3 mL via EPIDURAL
  Administered 2017-11-20: 5 mL via EPIDURAL

## 2017-11-20 MED ORDER — SOD CITRATE-CITRIC ACID 500-334 MG/5ML PO SOLN: 30.0000 mL | ORAL | Status: DC

## 2017-11-20 MED ORDER — LABETALOL HCL 5 MG/ML IV SOLN
20.0000 mg | INTRAVENOUS | Status: DC | PRN
  Filled 2017-11-20: qty 16

## 2017-11-20 MED ORDER — OXYTOCIN 10 UNIT/ML IJ SOLN
INTRAVENOUS | Status: DC | PRN
  Administered 2017-11-20: 40 [IU] via INTRAVENOUS

## 2017-11-20 MED ORDER — FENTANYL CITRATE (PF) 100 MCG/2ML IJ SOLN
INTRAMUSCULAR | Status: DC | PRN
  Administered 2017-11-20: 20 ug via INTRATHECAL
  Administered 2017-11-20: 50 ug via INTRAVENOUS

## 2017-11-20 MED ORDER — DEXTROSE 5 % IV SOLN
3.0000 g | Freq: Once | INTRAVENOUS | Status: AC
  Administered 2017-11-20 (×2): 3 g via INTRAVENOUS
  Filled 2017-11-20: qty 3

## 2017-11-20 SURGICAL SUPPLY — 44 items
APL SKNCLS STERI-STRIP NONHPOA (GAUZE/BANDAGES/DRESSINGS) ×1
BENZOIN TINCTURE PRP APPL 2/3 (GAUZE/BANDAGES/DRESSINGS) ×2 IMPLANT
CLAMP CORD UMBIL (MISCELLANEOUS) IMPLANT
CLOTH BEACON ORANGE TIMEOUT ST (SAFETY) ×2 IMPLANT
DRAPE C SECTION CLR SCREEN (DRAPES) IMPLANT
DRSG OPSITE POSTOP 4X10 (GAUZE/BANDAGES/DRESSINGS) ×2 IMPLANT
DURAPREP 26ML APPLICATOR (WOUND CARE) ×2 IMPLANT
ELECT REM PT RETURN 9FT ADLT (ELECTROSURGICAL) ×2
ELECTRODE REM PT RTRN 9FT ADLT (ELECTROSURGICAL) ×1 IMPLANT
EXTRACTOR VACUUM M CUP 4 TUBE (SUCTIONS) IMPLANT
GAUZE SPONGE 4X4 12PLY STRL LF (GAUZE/BANDAGES/DRESSINGS) ×4 IMPLANT
GLOVE BIO SURGEON STRL SZ7.5 (GLOVE) ×2 IMPLANT
GLOVE BIOGEL PI IND STRL 7.0 (GLOVE) ×2 IMPLANT
GLOVE BIOGEL PI INDICATOR 7.0 (GLOVE) ×2
GOWN STRL REUS W/TWL 2XL LVL3 (GOWN DISPOSABLE) ×2 IMPLANT
GOWN STRL REUS W/TWL LRG LVL3 (GOWN DISPOSABLE) ×4 IMPLANT
HEMOSTAT SURGICEL 2X14 (HEMOSTASIS) ×1 IMPLANT
KIT ABG SYR 3ML LUER SLIP (SYRINGE) IMPLANT
NDL HYPO 25X5/8 SAFETYGLIDE (NEEDLE) IMPLANT
NEEDLE HYPO 22GX1.5 SAFETY (NEEDLE) ×2 IMPLANT
NEEDLE HYPO 25X5/8 SAFETYGLIDE (NEEDLE) IMPLANT
NS IRRIG 1000ML POUR BTL (IV SOLUTION) ×2 IMPLANT
PACK C SECTION WH (CUSTOM PROCEDURE TRAY) ×2 IMPLANT
PAD OB MATERNITY 4.3X12.25 (PERSONAL CARE ITEMS) ×2 IMPLANT
PENCIL SMOKE EVAC W/HOLSTER (ELECTROSURGICAL) ×2 IMPLANT
RTRCTR C-SECT PINK 25CM LRG (MISCELLANEOUS) ×2 IMPLANT
STAPLER VISISTAT 35W (STAPLE) ×1 IMPLANT
STRIP CLOSURE SKIN 1/2X4 (GAUZE/BANDAGES/DRESSINGS) ×2 IMPLANT
SUT CHROMIC 1 CTX 36 (SUTURE) ×4 IMPLANT
SUT PDS AB 0 CTX 60 (SUTURE) ×1 IMPLANT
SUT PLAIN 0 NONE (SUTURE) ×2 IMPLANT
SUT PLAIN 2 0 (SUTURE) ×2
SUT PLAIN ABS 2-0 CT1 27XMFL (SUTURE) IMPLANT
SUT VIC AB 1 CT1 27 (SUTURE) ×4
SUT VIC AB 1 CT1 27XBRD ANTBC (SUTURE) ×2 IMPLANT
SUT VIC AB 2-0 CT1 (SUTURE) ×3 IMPLANT
SUT VIC AB 3-0 CT1 27 (SUTURE) ×4
SUT VIC AB 3-0 CT1 TAPERPNT 27 (SUTURE) ×2 IMPLANT
SUT VIC AB 3-0 SH 27 (SUTURE)
SUT VIC AB 3-0 SH 27X BRD (SUTURE) IMPLANT
SUT VIC AB 4-0 KS 27 (SUTURE) ×2 IMPLANT
SYR BULB IRRIGATION 50ML (SYRINGE) IMPLANT
TOWEL OR 17X24 6PK STRL BLUE (TOWEL DISPOSABLE) ×2 IMPLANT
TRAY FOLEY BAG SILVER LF 14FR (SET/KITS/TRAYS/PACK) ×2 IMPLANT

## 2017-11-20 NOTE — H&P (Signed)
Obstetric History and Physical  Jodi Mcgee is a 32 y.o. [id number] with IUP at [redacted]w[redacted]d presenting for headache and RUQ today. She is having contractions but they are her usual mild contractions. Headache is worse today and she reports new onset right upper abdominal pain with nausea that is different than she is used to. Patient states she has been having  irregular contractions, none vaginal bleeding, intact membranes, with active fetal movement.    Prenatal Course Pregnancy complications or risks: Patient Active Problem List   Diagnosis Date Noted  . Back pain affecting pregnancy in third trimester 11/16/2017  . Transient hypertension 11/16/2017  . Non-compliant pregnant patient, third trimester 11/05/2017  . Diabetes mellitus in pregnancy, antepartum 11/05/2017  . Pelvic adhesive disease 10/22/2017  . LGA (large for gestational age) fetus affecting management of mother 10/22/2017  . Anemia affecting pregnancy in third trimester 10/11/2017  . Obesity (BMI 30-39.9) 09/25/2017  . Polyhydramnios affecting pregnancy in third trimester 09/25/2017  . Transient hypertension of pregnancy 09/13/2017  . Unwanted fertility 08/30/2017  . Previous cesarean section complicating pregnancy 37/62/8315  . Supervision of high risk pregnancy, antepartum, third trimester 08/23/2017  . Insufficient prenatal care 08/23/2017  . Type 2 diabetes mellitus complicating pregnancy, antepartum 08/23/2017  . Chronic pain syndrome 08/23/2017   Prenatal labs and studies: ABO, Rh: --/--/O POS (01/25 0146) Antibody: NEG (01/25 0146) Rubella: 3.39 (11/01 1058) RPR: Non Reactive (12/04 1028)  HBsAg: Negative (11/01 1058)  HIV: Non Reactive (12/04 1028)  GBS:  Prenatal Transfer Tool  Maternal Diabetes: Yes:  Diabetes Type:  Pre-pregnancy   Past Medical History:  Diagnosis Date  . Anemia   . Anxiety   . Blood transfusion without reported diagnosis    both CS  . Diabetes mellitus without complication ([place])    . GERD (gastroesophageal reflux disease)    has resolved  . IBS (irritable bowel syndrome)   . Ovarian cyst   . Peritonitis, acute generalized ([place])   . Sepsis [facility])     Past Surgical History:  Procedure Laterality Date  . APPENDECTOMY     ruptured   . CESAREAN SECTION    . OVARIAN CYST DRAINAGE    . SMALL BOWEL REPAIR      OB History  Gravida Para Term Preterm AB Living  3 2 1 1  0 2  SAB TAB Ectopic Multiple Live Births  0 0 0   2    # Outcome Date GA Lbr Len/2nd Weight Sex Delivery Anes PTL Lv  3 Current           2 Term 06/24/08 [redacted]w[redacted]d   F CS-LTranv Spinal N LIV  1 Preterm 04/08/07 [redacted]w[redacted]d   M CS-LTranv Spinal  LIV      Social History   Socioeconomic History  . Marital status: Single    Spouse name: None  . Number of children: None  . Years of education: None  . Highest education level: None  Social Needs  . Financial resource strain: None  . Food insecurity - worry: None  . Food insecurity - inability: None  . Transportation needs - medical: None  . Transportation needs - non-medical: None  Occupational History  . None  Tobacco Use  . Smoking status: Former Smoker    Packs/day: 0.50    Types: Cigarettes  . Smokeless tobacco: Never Used  Substance and Sexual Activity  . Alcohol use: No  . Drug use: No  . Sexual activity: None  Other Topics Concern  .  None  Social History Narrative   ** Merged History Encounter **        Family History  Problem Relation Age of Onset  . Hypertension Mother   . Anemia Mother   . Diabetes Father     Medications Prior to Admission  Medication Sig Dispense Refill Last Dose  . ACCU-CHEK FASTCLIX LANCETS MISC 1 Device by Percutaneous route 4 (four) times daily. QID as instructed 100 each 12 Taking  . acetaminophen (TYLENOL) 500 MG tablet Take 1,000 mg by mouth every 6 (six) hours as needed for moderate pain.   Taking  . cyclobenzaprine (FLEXERIL) 10 MG tablet Take 1 tablet (10 mg total) by mouth 3 (three) times  daily as needed for muscle spasms. 30 tablet 5 Taking  . ferrous sulfate (FERROUSUL) 325 (65 FE) MG tablet Take 1 tablet (325 mg total) by mouth 3 (three) times daily with meals. 90 tablet 1 Taking  . gentamicin (GARAMYCIN) 0.3 % ophthalmic solution Place 2 drops into the left eye 3 (three) times daily. 5 mL 0 Taking  . glyBURIDE (DIABETA) 2.5 MG tablet Take 3 tablets (7.5 mg total) by mouth 2 (two) times daily with a meal. 60 tablet 2 11/19/2017 at Unknown time  . Iron-FA-B Cmp-C-Biot-Probiotic (FUSION PLUS) CAPS Take 1 tablet by mouth daily. 60 capsule 1 11/19/2017 at Unknown time  . metFORMIN (GLUCOPHAGE) 1000 MG tablet Take 1 tablet (1,000 mg total) by mouth 2 (two) times daily with a meal. 60 tablet 3 11/19/2017 at Unknown time  . ondansetron (ZOFRAN ODT) 8 MG disintegrating tablet Take 1 tablet (8 mg total) by mouth every 8 (eight) hours as needed for nausea or vomiting. 30 tablet 1 Taking  . oxyCODONE-acetaminophen (PERCOCET/ROXICET) 5-325 MG tablet Take 1-2 tablets by mouth every 6 (six) hours as needed for moderate pain or severe pain. 15 tablet 0 Taking  . pantoprazole (PROTONIX) 20 MG tablet Take 1 tablet (20 mg total) by mouth 2 (two) times daily. 60 tablet 1 11/19/2017 at Unknown time  . triamcinolone ointment (KENALOG) 0.1 % Apply 1 application topically 2 (two) times daily. 30 g 0 Taking    Allergies  Allergen Reactions  . Cetirizine & Related Itching and Swelling  . Toradol [Ketorolac Tromethamine] Hives and Itching  . Contrast Media [Iodinated Diagnostic Agents] Itching    Mild itching after IV contrast on 6/1/7 No urticaria visible No wheezing No angioedema  . Nsaids Rash and Other (See Comments)    None per gi    Review of Systems: Negative except for what is mentioned in HPI.  Physical Exam: BP (!) 160/93   Pulse (!) 107   Temp 97.9 F (36.6 C) (Oral)   Resp 18   Wt 274 lb 1.3 oz (124.3 kg)   LMP 03/10/2017   SpO2 97%   BMI 44.24 kg/m  CONSTITUTIONAL:  Well-developed, well-nourished female in mild distress.  HENT:  Normocephalic, atraumatic, External right and left ear normal. Oropharynx is clear and moist EYES: Conjunctivae and EOM are normal. Pupils are equal, round, and reactive to light. No scleral icterus.  NECK: Normal range of motion, supple, no masses SKIN: Skin is warm and dry. No rash noted. Not diaphoretic. No erythema. No pallor. NEUROLOGIC: Alert and oriented to person, place, and time. Normal reflexes, muscle tone coordination. No cranial nerve deficit noted. PSYCHIATRIC: Normal mood and affect. Normal behavior. Normal judgment and thought content. CARDIOVASCULAR: Normal heart rate noted, regular rhythm RESPIRATORY: Effort and breath sounds normal, no problems with respiration noted  ABDOMEN: Soft, mildly tender RUQ, nondistended, gravid. MUSCULOSKELETAL: Normal range of motion. No edema and no tenderness. 2+ distal pulses.  Cervical Exam: n/a Presentation: cephalic FHT:  Baseline rate 145 bpm   Variability moderate  Accelerations present   Decelerations none Contractions: irregular   Pertinent Labs/Studies:   CBC Latest Ref Rng & Units 11/20/2017 11/16/2017 11/16/2017  WBC 4.0 - 10.5 K/uL 4.4 7.5 7.4  Hemoglobin 12.0 - 15.0 g/dL 8.0(L) 9.1(L) 8.8(L)  Hematocrit 36.0 - 46.0 % 25.9(L) 29.3(L) 28.4(L)  Platelets 150 - 400 K/uL 162 184 200   CMP Latest Ref Rng & Units 11/20/2017 11/16/2017 11/12/2017  Glucose 65 - 99 mg/dL 212(H) 153(H) 225(H)  BUN 6 - 20 mg/dL 6 6 7   Creatinine 0.44 - 1.00 mg/dL 0.73 0.51 0.57  Sodium 135 - 145 mmol/L 136 135 138  Potassium 3.5 - 5.1 mmol/L 2.8(L) 4.1 3.5  Chloride 101 - 111 mmol/L 107 106 102  CO2 22 - 32 mmol/L 21(L) 21(L) 22  Calcium 8.9 - 10.3 mg/dL 7.8(L) 8.2(L) 8.4(L)  Total Protein 6.5 - 8.1 g/dL 6.3(L) 6.8 6.2  Total Bilirubin 0.3 - 1.2 mg/dL 0.2(L) 1.1 0.2  Alkaline Phos 38 - 126 U/L 108 110 95  AST 15 - 41 U/L 20 36 11  ALT 14 - 54 U/L 16 17 12     Assessment : Jodi Mcgee is a 32 y.o. [id number] at [redacted]w[redacted]d being admitted for repeat c-section for severe pre-eclampsia based on BP, headache, RUQ pain. Onset of gestational HTN earlier this week with severe range BP today. Will proceed with repeat c-section. The risks of cesarean section were discussed with the patient; including but not limited to: infection which may require antibiotics; bleeding which may require transfusion or re-operation; injury to bowel, bladder, ureters or other surrounding organs; injury to the fetus; need for additional procedures including hysterectomy in the event of a life-threatening hemorrhage; placental abnormalities wth subsequent pregnancies, incisional problems, thromboembolic phenomenon and other postoperative/anesthesia complications. Answered all questions. The patient verbalized understanding of the plan, giving informed consent for the procedure.   Patient has been NPO since 4:30 pm, she will remain NPO for procedure. Anesthesia and OR aware. Preoperative prophylactic antibiotics and SCDs ordered on call to the OR.  To OR when ready.  Ancef 2 gms MgSO4 NPO IVFs    Feliz Beam, M.D. Attending [place], [facility] for [employer], [place] [facility]  11/20/2017, 6:43 PM

## 2017-11-20 NOTE — MAU Note (Signed)
Pt is a G3P2 at 36.3 weeks, currently being followed for Pre-E, s/p discharge from hospital on 11/17/2016 for Pre-E symptoms.  Pt currently has swelling face and extremities, RUQ pain, and HA.  RCS scheduled for 11/24/2017.

## 2017-11-20 NOTE — MAU Provider Note (Signed)
History     CSN: [id number]  Arrival date and time: 11/20/17 1726   First Provider Initiated Contact with Patient 11/20/17 1824      Chief Complaint  Patient presents with  . Hypertension  . Headache  . Nausea   HPI Ms. Jodi Mcgee is a 32 y.o. [phone] at [redacted]w[redacted]d who presents to MAU today with complaint of severe headache, LE edema and nausea. The patient states that headache started yesterday, Tylenol and Percocet have not helped. She denies blurred vision or floaters. She has had severe RUQ pain since her recent admission last week. She denies vaginal bleeding or LOF. She has had few, irregular contractions. She reports normal fetal movement. She is a T2DM on Metformin and Glyburide. She was diagnosed with GHTN on 11/16/16 at the time of her recent admission for RUQ pain. She has had 2 previous C/S and other abdominal surgeries.   OB History    Gravida Para Term Preterm AB Living   3 2 1 1  0 2   SAB TAB Ectopic Multiple Live Births   0 0 0   2      Past Medical History:  Diagnosis Date  . Anemia   . Anxiety   . Blood transfusion without reported diagnosis    both CS  . Diabetes mellitus without complication ([place])   . GERD (gastroesophageal reflux disease)    has resolved  . IBS (irritable bowel syndrome)   . Ovarian cyst   . Peritonitis, acute generalized ([place])   . Sepsis [facility])     Past Surgical History:  Procedure Laterality Date  . APPENDECTOMY     ruptured   . CESAREAN SECTION    . OVARIAN CYST DRAINAGE    . SMALL BOWEL REPAIR      Family History  Problem Relation Age of Onset  . Hypertension Mother   . Anemia Mother   . Diabetes Father     Social History   Tobacco Use  . Smoking status: Former Smoker    Packs/day: 0.50    Types: Cigarettes  . Smokeless tobacco: Never Used  Substance Use Topics  . Alcohol use: No  . Drug use: No    Allergies:  Allergies  Allergen Reactions  . Cetirizine & Related Itching and Swelling  . Toradol  [Ketorolac Tromethamine] Hives and Itching  . Contrast Media [Iodinated Diagnostic Agents] Itching    Mild itching after IV contrast on 6/1/7 No urticaria visible No wheezing No angioedema  . Nsaids Rash and Other (See Comments)    None per gi    Medications Prior to Admission  Medication Sig Dispense Refill Last Dose  . ACCU-CHEK FASTCLIX LANCETS MISC 1 Device by Percutaneous route 4 (four) times daily. QID as instructed 100 each 12 Taking  . acetaminophen (TYLENOL) 500 MG tablet Take 1,000 mg by mouth every 6 (six) hours as needed for moderate pain.   Taking  . cyclobenzaprine (FLEXERIL) 10 MG tablet Take 1 tablet (10 mg total) by mouth 3 (three) times daily as needed for muscle spasms. 30 tablet 5 Taking  . ferrous sulfate (FERROUSUL) 325 (65 FE) MG tablet Take 1 tablet (325 mg total) by mouth 3 (three) times daily with meals. 90 tablet 1 Taking  . gentamicin (GARAMYCIN) 0.3 % ophthalmic solution Place 2 drops into the left eye 3 (three) times daily. 5 mL 0 Taking  . glyBURIDE (DIABETA) 2.5 MG tablet Take 3 tablets (7.5 mg total) by mouth 2 (two) times daily with  a meal. 60 tablet 2 11/19/2017 at Unknown time  . Iron-FA-B Cmp-C-Biot-Probiotic (FUSION PLUS) CAPS Take 1 tablet by mouth daily. 60 capsule 1 11/19/2017 at Unknown time  . metFORMIN (GLUCOPHAGE) 1000 MG tablet Take 1 tablet (1,000 mg total) by mouth 2 (two) times daily with a meal. 60 tablet 3 11/19/2017 at Unknown time  . ondansetron (ZOFRAN ODT) 8 MG disintegrating tablet Take 1 tablet (8 mg total) by mouth every 8 (eight) hours as needed for nausea or vomiting. 30 tablet 1 Taking  . oxyCODONE-acetaminophen (PERCOCET/ROXICET) 5-325 MG tablet Take 1-2 tablets by mouth every 6 (six) hours as needed for moderate pain or severe pain. 15 tablet 0 Taking  . pantoprazole (PROTONIX) 20 MG tablet Take 1 tablet (20 mg total) by mouth 2 (two) times daily. 60 tablet 1 11/19/2017 at Unknown time  . triamcinolone ointment (KENALOG) 0.1 % Apply 1  application topically 2 (two) times daily. 30 g 0 Taking    Review of Systems  Constitutional: Negative for fever.  Eyes: Negative for visual disturbance.  Cardiovascular: Positive for leg swelling.  Gastrointestinal: Positive for abdominal pain and nausea. Negative for constipation, diarrhea and vomiting.  Genitourinary: Negative for vaginal bleeding and vaginal discharge.  Neurological: Positive for headaches.   Physical Exam   Blood pressure (!) 160/93, pulse (!) 107, temperature 97.9 F (36.6 C), temperature source Oral, resp. rate 18, weight 274 lb 1.3 oz (124.3 kg), last menstrual period 03/10/2017, SpO2 97 %.  Physical Exam  Nursing note and vitals reviewed. Constitutional: She is oriented to person, place, and time. She appears well-developed and well-nourished. No distress.  HENT:  Head: Normocephalic and atraumatic.  Cardiovascular: Tachycardia present.  Respiratory: Effort normal.  GI: Soft. She exhibits no mass. There is tenderness (mild to moderate RUQ tenderness to palpation). There is no guarding.  Musculoskeletal: She exhibits edema (1+ pitting to the mid shin).  Neurological: She is alert and oriented to person, place, and time. She has normal reflexes.  No clonus  Skin: Skin is warm and dry. No erythema.  Psychiatric: She has a normal mood and affect.    Results for orders placed or performed during the hospital encounter of 11/20/17 (from the past 24 hour(s))  CBC with Differential/Platelet     Status: Abnormal   Collection Time: 11/20/17  5:43 PM  Result Value Ref Range   WBC 4.4 4.0 - 10.5 K/uL   RBC 3.32 (L) 3.87 - 5.11 MIL/uL   Hemoglobin 8.0 (L) 12.0 - 15.0 g/dL   HCT 25.9 (L) 36.0 - 46.0 %   MCV 78.0 78.0 - 100.0 fL   MCH 24.1 (L) 26.0 - 34.0 pg   MCHC 30.9 30.0 - 36.0 g/dL   RDW 15.5 11.5 - 15.5 %   Platelets 162 150 - 400 K/uL   Neutrophils Relative % 78 %   Neutro Abs 3.5 1.7 - 7.7 K/uL   Lymphocytes Relative 16 %   Lymphs Abs 0.7 0.7 - 4.0  K/uL   Monocytes Relative 5 %   Monocytes Absolute 0.2 0.1 - 1.0 K/uL   Eosinophils Relative 1 %   Eosinophils Absolute 0.0 0.0 - 0.7 K/uL   Basophils Relative 0 %   Basophils Absolute 0.0 0.0 - 0.1 K/uL  Comprehensive metabolic panel     Status: Abnormal   Collection Time: 11/20/17  5:43 PM  Result Value Ref Range   Sodium 136 135 - 145 mmol/L   Potassium 2.8 (L) 3.5 - 5.1 mmol/L   Chloride  107 101 - 111 mmol/L   CO2 21 (L) 22 - 32 mmol/L   Glucose, Bld 212 (H) 65 - 99 mg/dL   BUN 6 6 - 20 mg/dL   Creatinine, Ser 0.73 0.44 - 1.00 mg/dL   Calcium 7.8 (L) 8.9 - 10.3 mg/dL   Total Protein 6.3 (L) 6.5 - 8.1 g/dL   Albumin 2.3 (L) 3.5 - 5.0 g/dL   AST 20 15 - 41 U/L   ALT 16 14 - 54 U/L   Alkaline Phosphatase 108 38 - 126 U/L   Total Bilirubin 0.2 (L) 0.3 - 1.2 mg/dL   GFR calc non Af Amer >60 >60 mL/min   GFR calc Af Amer >60 >60 mL/min   Anion gap 8 5 - 15    Fetal Monitoring: Baseline: 140 bpm Variability: moderate Accelerations: 15 x 15 Decelerations: none Contractions: few, irregular   MAU Course  Procedures None  MDM Serial BPs CBC, CMP, UA and urine protein/creatinine ratio Discussed with Dr. Rosana Hoes. Patient to OR with Dr. Rosana Hoes for C/S.  Type & Screen ordered Assessment and Plan  A: SIUP at [redacted]w[redacted]d Previous C/S x 2 Pre-eclampsia with severe features   P:  Patient to OR with Dr. Alric Quan, PA-C 11/20/2017, 6:43 PM

## 2017-11-20 NOTE — Op Note (Signed)
Jodi Mcgee PROCEDURE DATE: 11/21/2017  PREOPERATIVE DIAGNOSES: Intrauterine pregnancy at [redacted]w[redacted]d weeks gestation; severe preeclampsia and prior uterine incision x2, h/o open appendectomy   POSTOPERATIVE DIAGNOSES: The same  PROCEDURE: Repeat Classical Cesarean Section  SURGEON:  Feliz Beam, MD  ASSISTANT:  Verita Schneiders, MD  ANESTHESIOLOGY TEAM: Anesthesiologist: Lyn Hollingshead, MD CRNA: Sandrea Matte, CRNA; Rhymer, Waynard Reeds, CRNA  INDICATIONS: Jodi Mcgee is a 32 y.o. [phone] at [redacted]w[redacted]d here for cesarean section secondary to the indications listed under preoperative diagnoses; please see preoperative note for further details.  The risks of cesarean section were discussed with the patient including but were not limited to: bleeding which may require transfusion or reoperation; infection which may require antibiotics; injury to bowel, bladder, ureters or other surrounding organs; injury to the fetus; need for additional procedures including hysterectomy in the event of a life-threatening hemorrhage; placental abnormalities wth subsequent pregnancies, incisional problems, thromboembolic phenomenon and other postoperative/anesthesia complications.  She consents to blood transfusion in the event of an emergency. The patient verbalized understanding of the plan, giving informed written consent for the procedure.    FINDINGS:  Viable female infant in cephalic presentation.  Apgars 7 and 8.  Clear amniotic fluid.  Interrupted placenta (anterior) but otherwise appearing intact placenta, three vessel cord.  Uterus with extensive adhesions, bladder tacked up on to left anterior uterus with foley bulb palpable on anterior portion of lower uterine segment, multiple uterine adhesions to muscle in lower uterine segment anteriorly, multiple filmy adhesions from omentum to uterus bilaterally, uterus with adhesions to itself from lower uterine segment to midway up anterior uterus, fundal  portion of uterus free from adhesions, normal fallopian tubes and ovaries bilaterally.  ANESTHESIA: Spinal  INTRAVENOUS FLUIDS: 1400 ml   ESTIMATED BLOOD LOSS: 1498 ml URINE OUTPUT:  100 ml SPECIMENS: Placenta sent to pathology COMPLICATIONS: None immediate  PROCEDURE IN DETAIL:  The patient preoperatively received intravenous antibiotics and had sequential compression devices applied to her lower extremities.  She was then taken to the operating room where spinal anesthesia was administered and was found to be adequate. She was then placed in a dorsal supine position with a leftward tilt, and prepped and draped in a sterile manner.  A foley catheter was placed into her bladder with sterile technique and attached to constant gravity.  After a timeout was performed, a Pfannenstiel skin incision was made with scalpel over her preexisting scar and carried through to the underlying layer of fascia. The fascia was incised in the midline, and this incision was extended bilaterally using the Mayo scissors.  Kocher clamps were applied to the superior aspect of the fascial incision and the underlying rectus muscles were dissected off sharply. A similar process was carried out on the inferior aspect of the fascial incision. The rectus muscles were adhered together in the midline and the muscle was gently scored in the midline until a surgical plane could be appreciated. The peritoneum was eventually entered after careful dissection of planes of adhesion. Very dense adhesions noted from uterus to itself, bladder tacked high on lower uterine segment, muscle to uterus. To provide additional visualization, the skin/fascial incision was extended vertically in the midline in an inverse T shape along her prior existing scar. This improved visualization of the pelvis. The peritoneal incision was carefully extended laterally and caudad with good visualization of the bladder. Given the extensive adhesions and bladder tacked  high on uterus, decision made to proceed with classical hysterotomy. A vertical incision  was made on the uterus and extended cephalad and caudad bluntly. The infant was delivered from ROA position, nose and mouth were bulb suctioned, and the cord clamped and cut after 1 minute. The infant was then handed over to the waiting neonatology team. Uterine massage was then performed, and the placenta delivered intact with a three-vessel cord. The uterus was then cleared of clots and debris. The uterus was gently exteriorized.  The hysterotomy was closed in two layers with 0 Vicryl in a running locked fashion. Uterine tissue easily tore with suturing. A figure of 8 suture was placed along the cephalad aspect of the hysterotomy for some oozing. The fallopian tubes and ovaries were visualized bilaterally and normal appearing. The uterus was replaced within the abdomen and the pelvis was cleared of all clot and debris. Hemostasis was confirmed on all surfaces. Surgicel was placed along the hysterotomy for additional hemostasis. The fascia was then closed using 0 PDS in a running fashion.  The subcutaneous layer was irrigated, then reapproximated with 2-0 plain gut interrupted stitches, and 30 ml of 0.5% Marcaine was injected subcutaneously around the incision.  The skin was closed with staples. The patient tolerated the procedure well. Sponge, lap, instrument and needle counts were correct x 3.  She was taken to the recovery room in stable condition.   With low starting H/H and significant EBL, she was ordered for transfusion for 2 units pRBCs.   Feliz Beam, M.D. Attending [place], [facility] for [employer], [place]

## 2017-11-20 NOTE — Anesthesia Postprocedure Evaluation (Signed)
Anesthesia Post Note  Patient: Jodi Mcgee  Procedure(s) Performed: CESAREAN SECTION (Bilateral )     Patient location during evaluation: PACU Anesthesia Type: Combined Spinal/Epidural Level of consciousness: awake Pain management: pain level not controlled Vital Signs Assessment: post-procedure vital signs reviewed and stable Respiratory status: spontaneous breathing Postop Assessment: no headache, no backache, spinal receding, patient able to bend at knees, epidural receding and no apparent nausea or vomiting Anesthetic complications: no    Last Vitals:  Vitals:   11/20/17 2223 11/20/17 2227  BP:    Pulse: 93 87  Resp: 19 20  Temp:    SpO2: 91% 94%    Last Pain:  Vitals:   11/20/17 2223  TempSrc:   PainSc: 8    Pain Goal: Patients Stated Pain Goal: 2 (11/20/17 1758)               Anari Evitt JR,JOHN Mateo Flow

## 2017-11-20 NOTE — Anesthesia Procedure Notes (Signed)
Spinal  Patient location during procedure: OR Start time: 11/20/2017 7:27 PM End time: 11/20/2017 7:39 PM Staffing Anesthesiologist: Lyn Hollingshead, MD Performed: anesthesiologist  Preanesthetic Checklist Completed: patient identified, site marked, surgical consent, pre-op evaluation, timeout performed, IV checked, risks and benefits discussed and monitors and equipment checked Spinal Block Patient position: sitting Prep: site prepped and draped and DuraPrep Patient monitoring: cardiac monitor, continuous pulse ox, blood pressure and heart rate Approach: midline Location: L3-4 Injection technique: catheter Needle Needle type: Tuohy and Sprotte  Needle gauge: 24 G Needle length: 15 cm Needle insertion depth: 9 cm Catheter type: closed end flexible Catheter size: 19 g Catheter at skin depth: 15 cm Assessment Sensory level: T4

## 2017-11-20 NOTE — Anesthesia Preprocedure Evaluation (Signed)
Anesthesia Evaluation  Patient identified by MRN, date of birth, ID band Patient awake    Reviewed: Allergy & Precautions, H&P , NPO status , Patient's Chart, lab work & pertinent test results  Airway Mallampati: III  TM Distance: >3 FB Neck ROM: full    Dental no notable dental hx. (+) Teeth Intact   Pulmonary neg pulmonary ROS, former smoker,    Pulmonary exam normal breath sounds clear to auscultation       Cardiovascular Normal cardiovascular exam Rhythm:regular Rate:Normal     Neuro/Psych negative neurological ROS     GI/Hepatic Neg liver ROS,   Endo/Other  diabetes, Oral Hypoglycemic AgentsMorbid obesity  Renal/GU negative Renal ROS     Musculoskeletal   Abdominal (+) + obese,   Peds  Hematology  (+) Blood dyscrasia, anemia ,   Anesthesia Other Findings   Reproductive/Obstetrics (+) Pregnancy                             Anesthesia Physical Anesthesia Plan  ASA: III  Anesthesia Plan: Combined Spinal and Epidural   Post-op Pain Management:    Induction:   PONV Risk Score and Plan: 3 and Ondansetron, Dexamethasone and Scopolamine patch - Pre-op  Airway Management Planned: Nasal Cannula and Natural Airway  Additional Equipment:   Intra-op Plan:   Post-operative Plan:   Informed Consent: I have reviewed the patients History and Physical, chart, labs and discussed the procedure including the risks, benefits and alternatives for the proposed anesthesia with the patient or authorized representative who has indicated his/her understanding and acceptance.     Plan Discussed with: CRNA and Surgeon  Anesthesia Plan Comments:         Anesthesia Quick Evaluation

## 2017-11-20 NOTE — MAU Note (Signed)
Patient c/o  +headache Constant Tried tylenol no relief Rating pain 10/10  Denies vision changes  +epigastric pain Rating pain 9/10 Sharp and constant  +increase in swelling in hands, feet, legs, and face  +nausea; denies vomiting

## 2017-11-20 NOTE — Transfer of Care (Signed)
Immediate Anesthesia Transfer of Care Note  Patient: Jodi Mcgee  Procedure(s) Performed: CESAREAN SECTION (Bilateral )  Patient Location: PACU  Anesthesia Type:Spinal and Epidural  Level of Consciousness: awake, alert  and oriented  Airway & Oxygen Therapy: Patient Spontanous Breathing  Post-op Assessment: Report given to RN and Post -op Vital signs reviewed and stable  Post vital signs: Reviewed and stable HR 86, SaO2 98%, BP 141/82 RR 18  Last Vitals:  Vitals:   11/20/17 1909 11/20/17 1910  BP: (!) 141/85 (!) 141/85  Pulse: 98 98  Resp: 18   Temp:    SpO2: 99% 99%    Last Pain:  Vitals:   11/20/17 1800  TempSrc: Oral  PainSc:       Patients Stated Pain Goal: 2 (13/08/65 7846)  Complications: No apparent anesthesia complications

## 2017-11-21 ENCOUNTER — Other Ambulatory Visit: Payer: Self-pay

## 2017-11-21 ENCOUNTER — Encounter (HOSPITAL_COMMUNITY): Payer: Self-pay | Admitting: Obstetrics and Gynecology

## 2017-11-21 DIAGNOSIS — Z98891 History of uterine scar from previous surgery: Secondary | ICD-10-CM

## 2017-11-21 LAB — COMPREHENSIVE METABOLIC PANEL
ALT: 17 U/L (ref 14–54)
AST: 26 U/L (ref 15–41)
Albumin: 2.1 g/dL — ABNORMAL LOW (ref 3.5–5.0)
Alkaline Phosphatase: 102 U/L (ref 38–126)
Anion gap: 8 (ref 5–15)
BUN: 6 mg/dL (ref 6–20)
CALCIUM: 7.5 mg/dL — AB (ref 8.9–10.3)
CHLORIDE: 105 mmol/L (ref 101–111)
CO2: 22 mmol/L (ref 22–32)
CREATININE: 0.73 mg/dL (ref 0.44–1.00)
GFR calc Af Amer: >60 mL/min (ref >60)
GFR calc non Af Amer: >60 mL/min (ref >60)
Glucose, Bld: 116 mg/dL — ABNORMAL HIGH (ref 65–99)
Potassium: 3.2 mmol/L — ABNORMAL LOW (ref 3.5–5.1)
Sodium: 135 mmol/L (ref 135–145)
Total Bilirubin: 0.7 mg/dL (ref 0.3–1.2)
Total Protein: 5.6 g/dL — ABNORMAL LOW (ref 6.5–8.1)

## 2017-11-21 LAB — CBC
HCT: 29.8 % — ABNORMAL LOW (ref 36.0–46.0)
Hemoglobin: 9.8 g/dL — ABNORMAL LOW (ref 12.0–15.0)
MCH: 25.7 pg — AB (ref 26.0–34.0)
MCHC: 32.9 g/dL (ref 30.0–36.0)
MCV: 78.2 fL (ref 78.0–100.0)
PLATELETS: 173 10*3/uL (ref 150–400)
RBC: 3.81 MIL/uL — AB (ref 3.87–5.11)
RDW: 15.3 % (ref 11.5–15.5)
WBC: 10.2 10*3/uL (ref 4.0–10.5)

## 2017-11-21 LAB — GLUCOSE, CAPILLARY: GLUCOSE-CAPILLARY: 137 mg/dL — AB (ref 65–99)

## 2017-11-21 LAB — RPR: RPR Ser Ql: NONREACTIVE

## 2017-11-21 MED ORDER — MAGNESIUM SULFATE BOLUS VIA INFUSION: 4.0000 g | Freq: Once | INTRAVENOUS | Status: DC

## 2017-11-21 MED ORDER — HYDROMORPHONE HCL 1 MG/ML IJ SOLN
0.2500 mg | INTRAMUSCULAR | Status: AC | PRN
  Administered 2017-11-21 (×3): 0.25 mg via INTRAVENOUS
  Filled 2017-11-21 (×3): qty 0.5

## 2017-11-21 MED ORDER — FERROUS SULFATE 325 (65 FE) MG PO TABS
325.0000 mg | ORAL_TABLET | Freq: Two times a day (BID) | ORAL | Status: DC
  Administered 2017-11-21 – 2017-11-23 (×4): 325 mg via ORAL
  Filled 2017-11-21 (×4): qty 1

## 2017-11-21 MED ORDER — OXYCODONE HCL 5 MG PO TABS
10.0000 mg | ORAL_TABLET | ORAL | Status: DC | PRN
  Administered 2017-11-21 – 2017-11-22 (×6): 10 mg via ORAL
  Filled 2017-11-21 (×6): qty 2

## 2017-11-21 MED ORDER — DIPHENHYDRAMINE HCL 25 MG PO CAPS
25.0000 mg | ORAL_CAPSULE | Freq: Four times a day (QID) | ORAL | Status: DC | PRN
  Administered 2017-11-21 – 2017-11-22 (×3): 25 mg via ORAL
  Filled 2017-11-21 (×3): qty 1

## 2017-11-21 MED ORDER — HYDROMORPHONE HCL 1 MG/ML IJ SOLN
1.0000 mg | Freq: Once | INTRAMUSCULAR | Status: AC
  Administered 2017-11-21: 1 mg via INTRAVENOUS
  Filled 2017-11-21: qty 1

## 2017-11-21 MED ORDER — METFORMIN HCL 500 MG PO TABS
1000.0000 mg | ORAL_TABLET | Freq: Two times a day (BID) | ORAL | Status: DC
  Administered 2017-11-21 – 2017-11-23 (×4): 1000 mg via ORAL
  Filled 2017-11-21 (×7): qty 2

## 2017-11-21 MED ORDER — MAGNESIUM HYDROXIDE 400 MG/5ML PO SUSP
30.0000 mL | ORAL | Status: DC | PRN
  Administered 2017-11-22: 30 mL via ORAL
  Filled 2017-11-21: qty 30

## 2017-11-21 MED ORDER — MAGNESIUM SULFATE 40 G IN LACTATED RINGERS - SIMPLE: 2.0000 g/h | INTRAVENOUS | Status: DC

## 2017-11-21 MED ORDER — LABETALOL HCL 5 MG/ML IV SOLN: 20.0000 mg | INTRAVENOUS | Status: DC | PRN

## 2017-11-21 MED ORDER — OXYCODONE-ACETAMINOPHEN 5-325 MG PO TABS: 2.0000 | ORAL_TABLET | ORAL | Status: DC | PRN

## 2017-11-21 MED ORDER — ACETAMINOPHEN 325 MG PO TABS
650.0000 mg | ORAL_TABLET | ORAL | Status: DC | PRN
  Administered 2017-11-21: 650 mg via ORAL
  Filled 2017-11-21: qty 2

## 2017-11-21 MED ORDER — MAGNESIUM SULFATE 40 G IN LACTATED RINGERS - SIMPLE
2.0000 g/h | INTRAVENOUS | Status: AC
  Administered 2017-11-21: 2 g/h via INTRAVENOUS
  Filled 2017-11-21: qty 40
  Filled 2017-11-21: qty 500

## 2017-11-21 MED ORDER — OXYCODONE HCL 5 MG PO TABS: 5.0000 mg | ORAL_TABLET | ORAL | Status: DC | PRN

## 2017-11-21 MED ORDER — SENNOSIDES-DOCUSATE SODIUM 8.6-50 MG PO TABS: 2.0000 | ORAL_TABLET | ORAL | Status: DC

## 2017-11-21 MED ORDER — WITCH HAZEL-GLYCERIN EX PADS: 1.0000 "application " | MEDICATED_PAD | CUTANEOUS | Status: DC | PRN

## 2017-11-21 MED ORDER — OXYCODONE-ACETAMINOPHEN 5-325 MG PO TABS: 1.0000 | ORAL_TABLET | ORAL | Status: DC | PRN

## 2017-11-21 MED ORDER — OXYTOCIN 40 UNITS IN LACTATED RINGERS INFUSION - SIMPLE MED: 2.5000 [IU]/h | INTRAVENOUS | Status: AC

## 2017-11-21 MED ORDER — HYDRALAZINE HCL 20 MG/ML IJ SOLN: 5.0000 mg | INTRAMUSCULAR | Status: DC | PRN

## 2017-11-21 MED ORDER — LACTATED RINGERS IV SOLN
INTRAVENOUS | Status: DC
  Administered 2017-11-21 (×2): via INTRAVENOUS

## 2017-11-21 MED ORDER — SIMETHICONE 80 MG PO CHEW: 80.0000 mg | CHEWABLE_TABLET | ORAL | Status: DC

## 2017-11-21 MED ORDER — COCONUT OIL OIL: 1.0000 "application " | TOPICAL_OIL | Status: DC | PRN

## 2017-11-21 MED ORDER — ENOXAPARIN SODIUM 40 MG/0.4ML ~~LOC~~ SOLN: 40.0000 mg | SUBCUTANEOUS | Status: DC

## 2017-11-21 MED ORDER — DIBUCAINE 1 % RE OINT: 1.0000 "application " | TOPICAL_OINTMENT | RECTAL | Status: DC | PRN

## 2017-11-21 MED ORDER — SIMETHICONE 80 MG PO CHEW
80.0000 mg | CHEWABLE_TABLET | ORAL | Status: DC | PRN
  Administered 2017-11-21: 80 mg via ORAL
  Filled 2017-11-21: qty 1

## 2017-11-21 MED ORDER — MENTHOL 3 MG MT LOZG: 1.0000 | LOZENGE | OROMUCOSAL | Status: DC | PRN

## 2017-11-21 MED ORDER — PRENATAL MULTIVITAMIN CH
1.0000 | ORAL_TABLET | Freq: Every day | ORAL | Status: DC
  Administered 2017-11-22 – 2017-11-23 (×2): 1 via ORAL
  Filled 2017-11-21 (×2): qty 1

## 2017-11-21 MED ORDER — ENOXAPARIN SODIUM 60 MG/0.6ML ~~LOC~~ SOLN
0.5000 mg/kg | SUBCUTANEOUS | Status: DC
  Administered 2017-11-22: 60 mg via SUBCUTANEOUS
  Filled 2017-11-21 (×3): qty 0.6

## 2017-11-21 MED ORDER — MEASLES, MUMPS & RUBELLA VAC ~~LOC~~ INJ
0.5000 mL | INJECTION | Freq: Once | SUBCUTANEOUS | Status: DC
  Filled 2017-11-21: qty 0.5

## 2017-11-21 MED ORDER — ZOLPIDEM TARTRATE 5 MG PO TABS: 5.0000 mg | ORAL_TABLET | Freq: Every evening | ORAL | Status: DC | PRN

## 2017-11-21 MED ORDER — TETANUS-DIPHTH-ACELL PERTUSSIS 5-2.5-18.5 LF-MCG/0.5 IM SUSP: 0.5000 mL | Freq: Once | INTRAMUSCULAR | Status: DC

## 2017-11-21 NOTE — Progress Notes (Signed)
Faculty Attending Note  Post Op Day 1  Subjective: Patient is feeling very upset and in significant pain. She reports poorly controlled pain on PO pain meds. She is not ambulating yet, denies light-headedness or dizziness. She is not passing flatus. She is tolerating a regular diet without nausea/vomiting. Bleeding is moderate. She is bottle feeding. Baby is in NICU, has not been to see baby yet and she is concerned about how baby is doing.  Objective: Blood pressure (!) 143/85, pulse (!) 105, temperature 97.6 F (36.4 C), temperature source Oral, resp. rate 20, height _0  (1.727 m), weight 274 lb 1.3 oz (124.3 kg), last menstrual period 03/10/2017, SpO2 98 %, unknown if currently breastfeeding. Temp:  [97.6 F (36.4 C)-98.7 F (37.1 C)] 97.6 F (36.4 C) (01/30 0450) Pulse Rate:  [81-113] 105 (01/30 0450) Resp:  [13-29] 20 (01/30 0450) BP: (92-161)/(80-102) 143/85 (01/30 0450) SpO2:  [90 %-100 %] 98 % (01/30 0450) Weight:  [274 lb 1.3 oz (124.3 kg)] 274 lb 1.3 oz (124.3 kg) (01/29 2350)  Physical Exam:  General: alert, oriented, cooperative, visibly upset and crying Chest: normal respiratory effort Heart: RRR  Abdomen: softly distended, appropriately tender to palpation, incision covered by dressing with no evidence of active bleeding  Uterine Fundus: unable to palpate secondary to distention Lochia: moderate, rubra DVT Evaluation: no evidence of DVT Extremities: no edema, no calf tenderness, SCDs in place  UOP: 100 mL/hr clear yellow urine   Current Facility-Administered Medications:  .  0.9 %  sodium chloride infusion, , Intravenous, Once, Luvenia Redden, PA-C .  acetaminophen (TYLENOL) tablet 650 mg, 650 mg, Oral, Q4H PRN, Anyanwu, Ugonna A, MD .  coconut oil, 1 application, Topical, PRN, Anyanwu, Sallyanne Havers, MD .  witch hazel-glycerin (TUCKS) pad 1 application, 1 application, Topical, PRN **AND** dibucaine (NUPERCAINAL) 1 % rectal ointment 1 application, 1 application,  Rectal, PRN, Anyanwu, Ugonna A, MD .  diphenhydrAMINE (BENADRYL) capsule 25 mg, 25 mg, Oral, Q6H PRN, Anyanwu, Ugonna A, MD, 25 mg at 11/21/17 0034 .  enoxaparin (LOVENOX) injection 40 mg, 40 mg, Subcutaneous, Q24H, Anyanwu, Ugonna A, MD .  ferrous sulfate tablet 325 mg, 325 mg, Oral, BID WC, Anyanwu, Ugonna A, MD .  hydrALAZINE (APRESOLINE) injection 5-10 mg, 5-10 mg, Intravenous, Q20 Min PRN, Anyanwu, Ugonna A, MD .  HYDROmorphone (DILAUDID) 1 MG/ML injection, , , ,  .  HYDROmorphone (DILAUDID) 1 MG/ML injection, , , ,  .  HYDROmorphone (DILAUDID) injection 0.25 mg, 0.25 mg, Intravenous, Q2H PRN, Sloan Leiter, MD .  labetalol (NORMODYNE,TRANDATE) injection 20-40 mg, 20-40 mg, Intravenous, Q10 min PRN, Anyanwu, Ugonna A, MD .  lactated ringers infusion, , Intravenous, Continuous, Sloan Leiter, MD .  magnesium hydroxide (MILK OF MAGNESIA) suspension 30 mL, 30 mL, Oral, Q3 days PRN, Anyanwu, Ugonna A, MD .  magnesium sulfate 40 grams in LR 500 mL OB infusion, 2 g/hr, Intravenous, Continuous, Sloan Leiter, MD .  measles, mumps and rubella vaccine (MMR) injection 0.5 mL, 0.5 mL, Subcutaneous, Once, Anyanwu, Ugonna A, MD .  menthol-cetylpyridinium (CEPACOL) lozenge 3 mg, 1 lozenge, Oral, Q2H PRN, Anyanwu, Ugonna A, MD .  metFORMIN (GLUCOPHAGE) tablet 1,000 mg, 1,000 mg, Oral, BID WC, Sloan Leiter, MD .  oxyCODONE (Oxy IR/ROXICODONE) immediate release tablet 10 mg, 10 mg, Oral, Q4H PRN, Sloan Leiter, MD, 10 mg at 11/21/17 0446 .  oxyCODONE (Oxy IR/ROXICODONE) immediate release tablet 5 mg, 5 mg, Oral, Q4H PRN, Sloan Leiter, MD .  oxytocin (  PITOCIN) IV infusion 40 units in LR 1000 mL - Premix, 2.5 Units/hr, Intravenous, Continuous, Anyanwu, Ugonna A, MD .  prenatal multivitamin tablet 1 tablet, 1 tablet, Oral, Q1200, Anyanwu, Ugonna A, MD .  simethicone (MYLICON) chewable tablet 80 mg, 80 mg, Oral, PRN, Anyanwu, Ugonna A, MD .  Tdap (BOOSTRIX) injection 0.5 mL, 0.5 mL, Intramuscular, Once,  Anyanwu, Ugonna A, MD .  zolpidem (AMBIEN) tablet 5 mg, 5 mg, Oral, QHS PRN, Anyanwu, Ugonna A, MD Recent Labs    11/20/17 1743  HGB 8.0*  HCT 25.9*    Assessment/Plan:  Patient is 32 y.o. [id number] POD#1 s/p repeat classical c-section with inverse T on skin for pre-eclampsia with severe features in setting of prior uterine incision. She is on MgSO4, s/p 2 units pRBCs for low starting H/H and EBL ~1500 mL during surgery. She is complaining of significant pain, very upset with the fact that she feels like her pain is not being properly addressed. Will give additional IV pain meds now. Otherwise, appears to be recovering well. Awaiting morning H/H  MgSO4 x 24 hrs, to stop at 10 pm tonight Diabetic diet Cont metformin 1000 mg BID once eating  Routine post partum care IV pain meds this am, then PO pain meds prn Will need staples removed POD#3/4 Plans for IUD  S/p 2 units pRBCs transfused AM CBC/CMP being drawn now    Sloan Leiter 11/21/2017, 5:36 AM

## 2017-11-21 NOTE — Progress Notes (Signed)
Pt reports her pain a 9 no relief from pain meds.   Pt talking on phone and dosing off while on phone.

## 2017-11-21 NOTE — Progress Notes (Signed)
Pt oob in wc to NICU with RN to see infant.   tol well.

## 2017-11-21 NOTE — Anesthesia Postprocedure Evaluation (Signed)
Anesthesia Post Note  Patient: Jodi Mcgee  Procedure(s) Performed: CESAREAN SECTION (Bilateral )     Patient location during evaluation: [facility] Anesthesia Type: Spinal Level of consciousness: awake and alert and oriented Pain management: satisfactory to patient Vital Signs Assessment: post-procedure vital signs reviewed and stable Respiratory status: respiratory function stable and spontaneous breathing Cardiovascular status: blood pressure returned to baseline Postop Assessment: no headache, no backache, spinal receding, patient able to bend at knees and adequate PO intake Anesthetic complications: no    Last Vitals:  Vitals:   11/21/17 0620 11/21/17 0700  BP:    Pulse:    Resp:  20  Temp:    SpO2: 100%     Last Pain:  Vitals:   11/21/17 0740  TempSrc:   PainSc: Asleep   Pain Goal: Patients Stated Pain Goal: 2 (11/21/17 0553)               Katherina Mires

## 2017-11-21 NOTE — Addendum Note (Signed)
Addendum  created 11/21/17 0752 by Flossie Dibble, CRNA   Sign clinical note

## 2017-11-21 NOTE — Progress Notes (Signed)
Pt requesting something stronger for pain, spoke with dr. Rosana Hoes no new orders.   Back into see pt pt upset crying supporting and talking with pt.  Pt rquesting to talk with dr. Rosana Hoes, dr on unit notified will go see pt.

## 2017-11-21 NOTE — Progress Notes (Signed)
Pt c/o pain pt refuses to sit up or get oob.  Dr. Rosana Hoes notified. Pt remains in bed.

## 2017-11-22 MED ORDER — ACETAMINOPHEN 500 MG PO TABS
1000.0000 mg | ORAL_TABLET | Freq: Four times a day (QID) | ORAL | Status: DC | PRN
  Administered 2017-11-22 (×2): 1000 mg via ORAL
  Filled 2017-11-22 (×3): qty 2

## 2017-11-22 MED ORDER — METOCLOPRAMIDE HCL 10 MG PO TABS
10.0000 mg | ORAL_TABLET | Freq: Three times a day (TID) | ORAL | Status: DC
  Administered 2017-11-22 – 2017-11-23 (×3): 10 mg via ORAL
  Filled 2017-11-22 (×3): qty 1

## 2017-11-22 MED ORDER — OXYCODONE HCL 5 MG PO TABS
10.0000 mg | ORAL_TABLET | ORAL | Status: DC | PRN
Start: 2017-11-22 — End: 2017-11-23
  Administered 2017-11-22 – 2017-11-23 (×6): 10 mg via ORAL
  Filled 2017-11-22 (×6): qty 2

## 2017-11-22 MED ORDER — ACETAMINOPHEN 500 MG PO TABS
1000.0000 mg | ORAL_TABLET | Freq: Four times a day (QID) | ORAL | Status: DC
  Administered 2017-11-22 – 2017-11-23 (×3): 1000 mg via ORAL
  Filled 2017-11-22 (×3): qty 2

## 2017-11-22 MED ORDER — TRAMADOL HCL 50 MG PO TABS
100.0000 mg | ORAL_TABLET | Freq: Once | ORAL | Status: DC
  Filled 2017-11-22: qty 2

## 2017-11-22 MED ORDER — HYDROMORPHONE HCL 2 MG PO TABS
4.0000 mg | ORAL_TABLET | ORAL | Status: DC | PRN
  Administered 2017-11-22 (×3): 4 mg via ORAL
  Filled 2017-11-22 (×3): qty 2

## 2017-11-22 MED ORDER — PROMETHAZINE HCL 25 MG PO TABS
25.0000 mg | ORAL_TABLET | Freq: Four times a day (QID) | ORAL | Status: DC | PRN
  Administered 2017-11-22: 25 mg via ORAL
  Filled 2017-11-22: qty 1

## 2017-11-22 MED ORDER — AMLODIPINE BESYLATE 5 MG PO TABS
5.0000 mg | ORAL_TABLET | Freq: Every day | ORAL | Status: DC
  Administered 2017-11-22 – 2017-11-23 (×2): 5 mg via ORAL
  Filled 2017-11-22 (×2): qty 1

## 2017-11-22 NOTE — Progress Notes (Signed)
Subjective: Postpartum Day 2: Cesarean Delivery Patient reports some incisional pain and requesting IV narcotics. She is ambulating to the bathroom but not beyond that. She is tolerating a regular diet and passing flatus.     Objective: Vital signs in last 24 hours: Temp:  [98.1 F (36.7 C)-98.7 F (37.1 C)] 98.1 F (36.7 C) (01/31 1200) Pulse Rate:  [87-104] 89 (01/31 1200) Resp:  [18-20] 20 (01/31 1200) BP: (128-146)/(70-93) 128/82 (01/31 1200) SpO2:  [98 %-100 %] 98 % (01/31 1200) Weight:  [265 lb 8 oz (120.4 kg)] 265 lb 8 oz (120.4 kg) (01/31 0248)  Physical Exam:  General: alert, cooperative and no distress Lochia: appropriate Uterine Fundus: firm Incision: dressing is clean dry and intact DVT Evaluation: No evidence of DVT seen on physical exam. No cords or calf tenderness.  Recent Labs    11/20/17 1743 11/21/17 0527  HGB 8.0* 9.8*  HCT 25.9* 29.8*    Assessment/Plan: Status post Cesarean section. Doing well postoperatively.  Encouraged ambulation Changed dilaudid to oxyIR CBG stable on metformin Continue DVD prophylaxis with lovenox Discussed plan to be discharge tomorrow.  [name] 11/22/2017, 1:16 PM

## 2017-11-23 ENCOUNTER — Encounter (HOSPITAL_COMMUNITY)
Admission: RE | Admit: 2017-11-23 | Discharge: 2017-11-23 | Disposition: A | Discharge status: Home / Self Care | Payer: Medicaid Other | Source: Ambulatory Visit

## 2017-11-23 HISTORY — DX: Encounter for other specified aftercare: Z51.89

## 2017-11-23 MED ORDER — AMLODIPINE BESYLATE 5 MG PO TABS: 5.0000 mg | ORAL_TABLET | Freq: Every day | ORAL | 0 refills | Status: DC

## 2017-11-23 MED ORDER — OXYCODONE HCL 10 MG PO TABS: 10.0000 mg | ORAL_TABLET | ORAL | 0 refills | Status: DC | PRN

## 2017-11-23 MED ORDER — DOCUSATE SODIUM 100 MG PO CAPS: 100.0000 mg | ORAL_CAPSULE | Freq: Two times a day (BID) | ORAL | 2 refills | Status: DC | PRN

## 2017-11-23 NOTE — Progress Notes (Signed)
Pt discharged with printed instructions. Pt verbalized an understanding. No concerns noted. Harleen Fineberg L Burney Calzadilla, RN 

## 2017-11-23 NOTE — Discharge Summary (Signed)
OB Discharge Summary     Patient Name: Jodi Mcgee DOB: 04-04-1986 MRN: [id number]  Date of admission: 11/20/2017 Delivering MD: Sloan Leiter   Date of discharge: 11/23/2017  Admitting diagnosis: 37wks legs and feet swollen, nausea, headache Intrauterine pregnancy: [redacted]w[redacted]d     Secondary diagnosis:  Active Problems:   Previous cesarean section complicating pregnancy   Type 2 diabetes mellitus complicating pregnancy, antepartum   Hypertension in pregnancy, preeclampsia, severe, delivered/postpartum   Polyhydramnios affecting pregnancy in third trimester   Pelvic adhesive disease   LGA (large for gestational age) fetus affecting management of mother   S/P cesarean section      Discharge diagnosis: Preterm Pregnancy Delivered and Preeclampsia (severe)                                                                                                Hospital course:  Patient presented with severe preeclampsia and history of 2 previous cesarean section at [redacted]w[redacted]d. Patient underwent a repeat cesarean section with classical incision (with T-incision on skin). She received magnesium sulfate for 24 hours for seizure prophylaxis and was started on Norvasc. By POD#3, patient ambulated, tolerated a regular diet, voided, and was passing flatus. She plans to breastfeed and use IUD for contraception  Physical exam  Vitals:   11/22/17 1709 11/22/17 1957 11/23/17 0017 11/23/17 0410  BP: (!) 146/86 (!) 145/80 (!) 158/82 (!) 155/83  Pulse: 100 (!) 103 (!) 102 (!) 104  Resp: 20 20 18 18   Temp: 98.5 F (36.9 C) 98.6 F (37 C) 98.3 F (36.8 C) 98.4 F (36.9 C)  TempSrc: Oral Oral    SpO2: 99% 100% 100% 100%  Weight:    260 lb 8 oz (118.2 kg)  Height:       General: alert, cooperative and no distress Lochia: appropriate Uterine Fundus: firm Incision: healing well without erythema, induration or drainage. Incision covered with honey comb dressing DVT Evaluation: No evidence of DVT seen on  physical exam. Negative Homan's sign. No cords or calf tenderness. Labs: Lab Results  Component Value Date   WBC 10.2 11/21/2017   HGB 9.8 (L) 11/21/2017   HCT 29.8 (L) 11/21/2017   MCV 78.2 11/21/2017   PLT 173 11/21/2017   CMP Latest Ref Rng & Units 11/21/2017  Glucose 65 - 99 mg/dL 116(H)  BUN 6 - 20 mg/dL 6  Creatinine 0.44 - 1.00 mg/dL 0.73  Sodium 135 - 145 mmol/L 135  Potassium 3.5 - 5.1 mmol/L 3.2(L)  Chloride 101 - 111 mmol/L 105  CO2 22 - 32 mmol/L 22  Calcium 8.9 - 10.3 mg/dL 7.5(L)  Total Protein 6.5 - 8.1 g/dL 5.6(L)  Total Bilirubin 0.3 - 1.2 mg/dL 0.7  Alkaline Phos 38 - 126 U/L 102  AST 15 - 41 U/L 26  ALT 14 - 54 U/L 17    Discharge instruction: per After Visit Summary and "Baby and Me Booklet".  After visit meds:  Allergies as of 11/23/2017      Reactions   Cetirizine & Related Itching, Swelling   Toradol [ketorolac Tromethamine] Hives, Itching  Contrast Media [iodinated Diagnostic Agents] Itching   Mild itching after IV contrast on 6/1/7 No urticaria visible No wheezing No angioedema   Nsaids Rash, Other (See Comments)   None per gi      Medication List    STOP taking these medications   ferrous sulfate 325 (65 FE) MG tablet Commonly known as:  FERROUSUL   glyBURIDE 2.5 MG tablet Commonly known as:  DIABETA   ondansetron 8 MG disintegrating tablet Commonly known as:  ZOFRAN ODT   oxyCODONE-acetaminophen 5-325 MG tablet Commonly known as:  PERCOCET/ROXICET   triamcinolone ointment 0.1 % Commonly known as:  KENALOG     TAKE these medications   ACCU-CHEK FASTCLIX LANCETS Misc 1 Device by Percutaneous route 4 (four) times daily. QID as instructed   acetaminophen 500 MG tablet Commonly known as:  TYLENOL Take 1,000 mg by mouth every 6 (six) hours as needed for moderate pain.   amLODipine 5 MG tablet Commonly known as:  NORVASC Take 1 tablet (5 mg total) by mouth daily.   cyclobenzaprine 10 MG tablet Commonly known as:   FLEXERIL Take 1 tablet (10 mg total) by mouth 3 (three) times daily as needed for muscle spasms.   docusate sodium 100 MG capsule Commonly known as:  COLACE Take 1 capsule (100 mg total) by mouth 2 (two) times daily as needed.   FUSION PLUS Caps Take 1 tablet by mouth daily.   gentamicin 0.3 % ophthalmic solution Commonly known as:  GARAMYCIN Place 2 drops into the left eye 3 (three) times daily.   metFORMIN 1000 MG tablet Commonly known as:  GLUCOPHAGE Take 1 tablet (1,000 mg total) by mouth 2 (two) times daily with a meal.   Oxycodone HCl 10 MG Tabs Take 1 tablet (10 mg total) by mouth every 4 (four) hours as needed for moderate pain.   pantoprazole 20 MG tablet Commonly known as:  PROTONIX Take 1 tablet (20 mg total) by mouth 2 (two) times daily.       Diet: carb modified diet  Activity: Advance as tolerated. Pelvic rest for 6 weeks.   Outpatient follow JK:QASUORV will be scheduled to return on 2/8 for staple removal and BP check Follow up Appt: Future Appointments  Date Time Provider [place]  12/10/2017  1:30 PM [facility]  01/03/2018 10:55 AM Chancy Milroy, MD [facility]   Follow up Visit:No Follow-up on file.  Postpartum contraception: IUD planned  Newborn Data: Live born female  Birth Weight: 9 lb 7 oz (4280 g) APGAR: 8, 8  Newborn Delivery   Birth date/time:  11/20/2017 20:21:00 Delivery type:  C-Section, Classical C-section categorization:  Repeat     Baby Feeding: Bottle and Breast Disposition:NICU   11/23/2017 Mora Bellman, MD

## 2017-11-24 LAB — TYPE AND SCREEN
ABO/RH(D): O POS
ANTIBODY SCREEN: NEGATIVE
UNIT DIVISION: 0
UNIT DIVISION: 0
UNIT DIVISION: 0
UNIT DIVISION: 0
Unit division: 0
Unit division: 0

## 2017-11-24 LAB — BPAM RBC
BLOOD PRODUCT EXPIRATION DATE: 201902172359
BLOOD PRODUCT EXPIRATION DATE: 201902172359
BLOOD PRODUCT EXPIRATION DATE: 201902262359
BLOOD PRODUCT EXPIRATION DATE: 201903012359
Blood Product Expiration Date: 201902262359
Blood Product Expiration Date: 201903012359
ISSUE DATE / TIME: 201901292029
ISSUE DATE / TIME: 201901292029
ISSUE DATE / TIME: 201901300741
ISSUE DATE / TIME: 201901301038
UNIT TYPE AND RH: 5100
UNIT TYPE AND RH: 5100
Unit Type and Rh: 5100
Unit Type and Rh: 5100
Unit Type and Rh: 5100
Unit Type and Rh: 5100

## 2017-11-26 ENCOUNTER — Other Ambulatory Visit: Payer: Self-pay

## 2017-11-26 ENCOUNTER — Encounter (HOSPITAL_BASED_OUTPATIENT_CLINIC_OR_DEPARTMENT_OTHER): Payer: Self-pay

## 2017-11-26 ENCOUNTER — Emergency Department (HOSPITAL_BASED_OUTPATIENT_CLINIC_OR_DEPARTMENT_OTHER)
Admission: EM | Admit: 2017-11-26 | Discharge: 2017-11-27 | Disposition: A | Discharge status: Home / Self Care | Payer: Medicaid Other | Attending: Emergency Medicine | Admitting: Emergency Medicine

## 2017-11-26 ENCOUNTER — Emergency Department (HOSPITAL_BASED_OUTPATIENT_CLINIC_OR_DEPARTMENT_OTHER): Payer: Medicaid Other

## 2017-11-26 ENCOUNTER — Encounter: Payer: Self-pay | Admitting: Obstetrics and Gynecology

## 2017-11-26 DIAGNOSIS — Z79899 Other long term (current) drug therapy: Secondary | ICD-10-CM | POA: Diagnosis not present

## 2017-11-26 DIAGNOSIS — Z7984 Long term (current) use of oral hypoglycemic drugs: Secondary | ICD-10-CM | POA: Insufficient documentation

## 2017-11-26 DIAGNOSIS — E119 Type 2 diabetes mellitus without complications: Secondary | ICD-10-CM | POA: Insufficient documentation

## 2017-11-26 DIAGNOSIS — Z87891 Personal history of nicotine dependence: Secondary | ICD-10-CM | POA: Insufficient documentation

## 2017-11-26 DIAGNOSIS — R600 Localized edema: Secondary | ICD-10-CM | POA: Insufficient documentation

## 2017-11-26 DIAGNOSIS — IMO0002 Reserved for concepts with insufficient information to code with codable children: Secondary | ICD-10-CM

## 2017-11-26 DIAGNOSIS — R1011 Right upper quadrant pain: Secondary | ICD-10-CM | POA: Diagnosis present

## 2017-11-26 LAB — CBC WITH DIFFERENTIAL/PLATELET
BASOS ABS: 0 10*3/uL (ref 0.0–0.1)
Basophils Relative: 0 %
Eosinophils Absolute: 0.1 10*3/uL (ref 0.0–0.7)
Eosinophils Relative: 1 %
HCT: 25.5 % — ABNORMAL LOW (ref 36.0–46.0)
Hemoglobin: 7.9 g/dL — ABNORMAL LOW (ref 12.0–15.0)
LYMPHS PCT: 15 %
Lymphs Abs: 0.8 10*3/uL (ref 0.7–4.0)
MCH: 25.5 pg — AB (ref 26.0–34.0)
MCHC: 31 g/dL (ref 30.0–36.0)
MCV: 82.3 fL (ref 78.0–100.0)
MONO ABS: 0.3 10*3/uL (ref 0.1–1.0)
Monocytes Relative: 6 %
NEUTROS ABS: 3.9 10*3/uL (ref 1.7–7.7)
NEUTROS PCT: 78 %
Platelets: 223 10*3/uL (ref 150–400)
RBC: 3.1 MIL/uL — ABNORMAL LOW (ref 3.87–5.11)
RDW: 16.4 % — AB (ref 11.5–15.5)
WBC: 5.1 10*3/uL (ref 4.0–10.5)

## 2017-11-26 LAB — I-STAT CHEM 8, ED
BUN: 8 mg/dL (ref 6–20)
CHLORIDE: 100 mmol/L — AB (ref 101–111)
Calcium, Ion: 1.1 mmol/L — ABNORMAL LOW (ref 1.15–1.40)
Creatinine, Ser: 0.7 mg/dL (ref 0.44–1.00)
Glucose, Bld: 93 mg/dL (ref 65–99)
HEMATOCRIT: 26 % — AB (ref 36.0–46.0)
Hemoglobin: 8.8 g/dL — ABNORMAL LOW (ref 12.0–15.0)
Potassium: 3.3 mmol/L — ABNORMAL LOW (ref 3.5–5.1)
SODIUM: 140 mmol/L (ref 135–145)
TCO2: 27 mmol/L (ref 22–32)

## 2017-11-26 LAB — HEPATIC FUNCTION PANEL
ALBUMIN: 2.5 g/dL — AB (ref 3.5–5.0)
ALK PHOS: 86 U/L (ref 38–126)
ALT: 28 U/L (ref 14–54)
AST: 25 U/L (ref 15–41)
Bilirubin, Direct: <0.1 mg/dL — ABNORMAL LOW (ref 0.1–0.5)
Total Bilirubin: 0.3 mg/dL (ref 0.3–1.2)
Total Protein: 6.6 g/dL (ref 6.5–8.1)

## 2017-11-26 LAB — URINALYSIS, ROUTINE W REFLEX MICROSCOPIC
Bilirubin Urine: NEGATIVE
Glucose, UA: NEGATIVE mg/dL
Ketones, ur: NEGATIVE mg/dL
Leukocytes, UA: NEGATIVE
NITRITE: NEGATIVE
PH: 6.5 (ref 5.0–8.0)
Protein, ur: NEGATIVE mg/dL
Specific Gravity, Urine: 1.015 (ref 1.005–1.030)

## 2017-11-26 LAB — URINALYSIS, MICROSCOPIC (REFLEX): WBC, UA: NONE SEEN WBC/hpf (ref 0–5)

## 2017-11-26 LAB — BRAIN NATRIURETIC PEPTIDE: B NATRIURETIC PEPTIDE 5: 34.7 pg/mL (ref 0.0–100.0)

## 2017-11-26 MED ORDER — MORPHINE SULFATE (PF) 4 MG/ML IV SOLN
4.0000 mg | Freq: Once | INTRAVENOUS | Status: AC
  Administered 2017-11-26: 4 mg via INTRAVENOUS
  Filled 2017-11-26: qty 1

## 2017-11-26 NOTE — ED Provider Notes (Signed)
[facility] EMERGENCY DEPARTMENT Provider Note   CSN: [id number] Arrival date & time: 11/26/17  2001     History   Chief Complaint Chief Complaint  Patient presents with  . Abdominal Pain    postpartum    HPI Jodi Mcgee is a 32 y.o. female.  This patient is a 32 year old female with past medical history of diabetes, obesity, and recent childbirth by C-section within the past week.  She presents today for evaluation of right upper quadrant abdominal pain, leg swelling and pain that has worsened since delivery.  She denies any fevers or chills.  She denies any vaginal bleeding.  She denies any shortness of breath.   The history is provided by the patient.  Abdominal Pain   This is a new problem. Episode onset: Several days ago. The problem occurs constantly. The problem has been gradually worsening. Associated with: Recent childbirth. The pain is located in the RUQ. The quality of the pain is cramping. The pain is moderate. Pertinent negatives include fever, flatus, hematochezia and constipation. Nothing aggravates the symptoms. Nothing relieves the symptoms.    Past Medical History:  Diagnosis Date  . Anemia   . Anxiety   . Blood transfusion without reported diagnosis    both CS  . Diabetes mellitus without complication ([place])   . GERD (gastroesophageal reflux disease)    has resolved  . IBS (irritable bowel syndrome)   . Ovarian cyst   . Peritonitis, acute generalized ([place])   . Sepsis [facility])     Patient Active Problem List   Diagnosis Date Noted  . S/P cesarean section 11/21/2017  . Back pain affecting pregnancy in third trimester 11/16/2017  . Non-compliant pregnant patient, third trimester 11/05/2017  . Pelvic adhesive disease 10/22/2017  . LGA (large for gestational age) fetus affecting management of mother 10/22/2017  . Anemia affecting pregnancy in third trimester 10/11/2017  . Obesity (BMI 30-39.9) 09/25/2017  . Polyhydramnios affecting  pregnancy in third trimester 09/25/2017  . Hypertension in pregnancy, preeclampsia, severe, delivered/postpartum 09/13/2017  . Previous cesarean section complicating pregnancy 04/15/7627  . Supervision of high risk pregnancy, antepartum, third trimester 08/23/2017  . Insufficient prenatal care 08/23/2017  . Type 2 diabetes mellitus complicating pregnancy, antepartum 08/23/2017  . Chronic pain syndrome 08/23/2017    Past Surgical History:  Procedure Laterality Date  . APPENDECTOMY     ruptured   . CESAREAN SECTION    . CESAREAN SECTION Bilateral 11/20/2017   Procedure: CESAREAN SECTION;  Surgeon: Sloan Leiter, MD;  Location: [facility];  Service: Obstetrics;  Laterality: Bilateral;  . OVARIAN CYST DRAINAGE    . SMALL BOWEL REPAIR      OB History    Gravida Para Term Preterm AB Living   3 3 1 2  0 3   SAB TAB Ectopic Multiple Live Births   0 0 0 0 3       Home Medications    Prior to Admission medications   Medication Sig Start Date End Date Taking? Authorizing Provider  ACCU-CHEK FASTCLIX LANCETS MISC 1 Device by Percutaneous route 4 (four) times daily. QID as instructed 11/06/17   Chancy Milroy, MD  acetaminophen (TYLENOL) 500 MG tablet Take 1,000 mg by mouth every 6 (six) hours as needed for moderate pain.    [provider]  amLODipine (NORVASC) 5 MG tablet Take 1 tablet (5 mg total) by mouth daily. 11/23/17   Constant, Peggy, MD  cyclobenzaprine (FLEXERIL) 10 MG tablet Take 1  tablet (10 mg total) by mouth 3 (three) times daily as needed for muscle spasms. 11/12/17   Anyanwu, Sallyanne Havers, MD  docusate sodium (COLACE) 100 MG capsule Take 1 capsule (100 mg total) by mouth 2 (two) times daily as needed. 11/23/17   Constant, Peggy, MD  gentamicin (GARAMYCIN) 0.3 % ophthalmic solution Place 2 drops into the left eye 3 (three) times daily. 11/08/17   Chancy Milroy, MD  Iron-FA-B Cmp-C-Biot-Probiotic (FUSION PLUS) CAPS Take 1 tablet by mouth daily. 11/05/17   Aletha Halim, MD  metFORMIN (GLUCOPHAGE) 1000 MG tablet Take 1 tablet (1,000 mg total) by mouth 2 (two) times daily with a meal. 08/23/17   Constant, Peggy, MD  oxyCODONE 10 MG TABS Take 1 tablet (10 mg total) by mouth every 4 (four) hours as needed for moderate pain. 11/23/17   Constant, Peggy, MD  pantoprazole (PROTONIX) 20 MG tablet Take 1 tablet (20 mg total) by mouth 2 (two) times daily. 11/02/17   Wende Mott, CNM    Family History Family History  Problem Relation Age of Onset  . Hypertension Mother   . Anemia Mother   . Diabetes Father     Social History Social History   Tobacco Use  . Smoking status: Former Smoker    Packs/day: 0.50    Types: Cigarettes  . Smokeless tobacco: Never Used  Substance Use Topics  . Alcohol use: No  . Drug use: No     Allergies   Cetirizine & related; Toradol [ketorolac tromethamine]; Contrast media [iodinated diagnostic agents]; and Nsaids   Review of Systems Review of Systems  Constitutional: Negative for fever.  Gastrointestinal: Positive for abdominal pain. Negative for constipation, flatus and hematochezia.  All other systems reviewed and are negative.    Physical Exam Updated Vital Signs BP (!) 152/104 (BP Location: Right Wrist)   Pulse 99   Temp 99.1 F (37.3 C) (Oral)   Resp 18   Ht 5\' 6"  (1.676 m)   Wt 121 kg (266 lb 12.1 oz)   LMP 03/10/2017   SpO2 99%   BMI 43.06 kg/m   Physical Exam  Constitutional: She is oriented to person, place, and time. She appears well-developed and well-nourished. No distress.  HENT:  Head: Normocephalic and atraumatic.  Neck: Normal range of motion. Neck supple.  Cardiovascular: Normal rate and regular rhythm. Exam reveals no gallop and no friction rub.  No murmur heard. Pulmonary/Chest: Effort normal and breath sounds normal. No respiratory distress. She has no wheezes.  Abdominal: Soft. Bowel sounds are normal. She exhibits no distension. There is tenderness in the right upper  quadrant. There is no rigidity, no rebound and no guarding.  There is tenderness to palpation of the right upper quadrant.  Musculoskeletal: Normal range of motion.  There is 2+ pitting edema of both lower extremities.  Neurological: She is alert and oriented to person, place, and time.  Skin: Skin is warm and dry. She is not diaphoretic.  Nursing note and vitals reviewed.    ED Treatments / Results  Labs (all labs ordered are listed, but only abnormal results are displayed) Labs Reviewed  CBC WITH DIFFERENTIAL/PLATELET - Abnormal; Notable for the following components:      Result Value   RBC 3.10 (*)    Hemoglobin 7.9 (*)    HCT 25.5 (*)    MCH 25.5 (*)    RDW 16.4 (*)    All other components within normal limits  URINALYSIS, ROUTINE W REFLEX MICROSCOPIC -  Abnormal; Notable for the following components:   Hgb urine dipstick SMALL (*)    All other components within normal limits  HEPATIC FUNCTION PANEL - Abnormal; Notable for the following components:   Albumin 2.5 (*)    Bilirubin, Direct <0.1 (*)    All other components within normal limits  URINALYSIS, MICROSCOPIC (REFLEX) - Abnormal; Notable for the following components:   Bacteria, UA RARE (*)    Squamous Epithelial / LPF 0-5 (*)    All other components within normal limits  I-STAT CHEM 8, ED - Abnormal; Notable for the following components:   Potassium 3.3 (*)    Chloride 100 (*)    Calcium, Ion 1.10 (*)    Hemoglobin 8.8 (*)    HCT 26.0 (*)    All other components within normal limits  BRAIN NATRIURETIC PEPTIDE    EKG  EKG Interpretation None       Radiology No results found.  Procedures Procedures (including critical care time)  Medications Ordered in ED Medications  morphine 4 MG/ML injection 4 mg (not administered)     Initial Impression / Assessment and Plan / ED Course  I have reviewed the triage vital signs and the nursing notes.  Pertinent labs & imaging results that were available during  my care of the patient were reviewed by me and considered in my medical decision making (see chart for details).  Patient is 5 days postpartum from a C-section secondary to preeclampsia.  She presents here with right upper quadrant pain and lower extremity swelling.  Her workup today reveals no obvious abnormality.  She has leg edema, however her BNP is normal.  I doubt a postpartum cardiomyopathy.  I have discussed this case with Dr. Jed Limerick from [facility] who is on-call for OB.  She is in agreement with my assessment that the patient is appropriate for discharge.  She will be given Lasix, potassium, pain medicine, and will have her amlodipine increased.  Final Clinical Impressions(s) / ED Diagnoses   Final diagnoses:  RUQ pain    ED Discharge Orders    None       Veryl Speak, MD 11/27/17 [phone]

## 2017-11-26 NOTE — ED Triage Notes (Signed)
C/o right side abd pain, flank pain x 3 day-swelling to both feet-pt had csection 1/29-NAD-presents to triage in w/c

## 2017-11-26 NOTE — ED Notes (Signed)
Pt abd c-section site WNL-dsg intact

## 2017-11-26 NOTE — ED Notes (Signed)
ED Provider at bedside. 

## 2017-11-27 ENCOUNTER — Emergency Department (HOSPITAL_BASED_OUTPATIENT_CLINIC_OR_DEPARTMENT_OTHER): Payer: Medicaid Other

## 2017-11-27 ENCOUNTER — Other Ambulatory Visit: Payer: Self-pay

## 2017-11-27 MED ORDER — HYDROCODONE-ACETAMINOPHEN 5-325 MG PO TABS: 1.0000 | ORAL_TABLET | Freq: Four times a day (QID) | ORAL | 0 refills | Status: DC | PRN

## 2017-11-27 MED ORDER — POTASSIUM CHLORIDE ER 20 MEQ PO TBCR: 40.0000 meq | EXTENDED_RELEASE_TABLET | Freq: Every day | ORAL | 0 refills | Status: DC

## 2017-11-27 MED ORDER — ONDANSETRON HCL 4 MG/2ML IJ SOLN
INTRAMUSCULAR | Status: AC
  Filled 2017-11-27: qty 2

## 2017-11-27 MED ORDER — MORPHINE SULFATE (PF) 4 MG/ML IV SOLN
INTRAVENOUS | Status: AC
  Filled 2017-11-27: qty 1

## 2017-11-27 MED ORDER — MORPHINE SULFATE (PF) 4 MG/ML IV SOLN
4.0000 mg | Freq: Once | INTRAVENOUS | Status: AC
  Administered 2017-11-27: 4 mg via INTRAVENOUS

## 2017-11-27 MED ORDER — ONDANSETRON HCL 4 MG/2ML IJ SOLN
4.0000 mg | Freq: Once | INTRAMUSCULAR | Status: AC
  Administered 2017-11-27: 4 mg via INTRAVENOUS

## 2017-11-27 MED ORDER — FUROSEMIDE 40 MG PO TABS: 40.0000 mg | ORAL_TABLET | Freq: Every day | ORAL | 0 refills | Status: DC

## 2017-11-27 NOTE — ED Notes (Signed)
Pt discharged to home with family. NAD.  

## 2017-11-27 NOTE — Discharge Instructions (Signed)
Potassium and Lasix as prescribed.  Hydrocodone as prescribed as needed for pain.  Follow-up with OB on Friday as scheduled.

## 2017-11-30 ENCOUNTER — Inpatient Hospital Stay (HOSPITAL_COMMUNITY)
Admission: AD | Admit: 2017-11-30 | Discharge: 2017-12-02 | DRG: 776 | Disposition: A | Discharge status: Home / Self Care | Payer: Medicaid Other | Source: Ambulatory Visit | Attending: Obstetrics & Gynecology | Admitting: Obstetrics & Gynecology

## 2017-11-30 ENCOUNTER — Inpatient Hospital Stay (HOSPITAL_COMMUNITY): Payer: Medicaid Other

## 2017-11-30 ENCOUNTER — Ambulatory Visit: Payer: Medicaid Other | Admitting: *Deleted

## 2017-11-30 ENCOUNTER — Other Ambulatory Visit: Payer: Self-pay

## 2017-11-30 ENCOUNTER — Encounter (HOSPITAL_COMMUNITY): Payer: Self-pay

## 2017-11-30 VITALS — BP 168/113 | HR 102 | Wt 263.0 lb

## 2017-11-30 DIAGNOSIS — O165 Unspecified maternal hypertension, complicating the puerperium: Secondary | ICD-10-CM

## 2017-11-30 DIAGNOSIS — Z4802 Encounter for removal of sutures: Secondary | ICD-10-CM

## 2017-11-30 DIAGNOSIS — R51 Headache: Secondary | ICD-10-CM | POA: Diagnosis present

## 2017-11-30 DIAGNOSIS — O1205 Gestational edema, complicating the puerperium: Secondary | ICD-10-CM | POA: Diagnosis present

## 2017-11-30 DIAGNOSIS — O8883 Other embolism in the puerperium: Principal | ICD-10-CM | POA: Diagnosis present

## 2017-11-30 DIAGNOSIS — Z87891 Personal history of nicotine dependence: Secondary | ICD-10-CM | POA: Diagnosis not present

## 2017-11-30 DIAGNOSIS — I361 Nonrheumatic tricuspid (valve) insufficiency: Secondary | ICD-10-CM | POA: Diagnosis not present

## 2017-11-30 DIAGNOSIS — Z0130 Encounter for examination of blood pressure without abnormal findings: Secondary | ICD-10-CM

## 2017-11-30 DIAGNOSIS — J811 Chronic pulmonary edema: Secondary | ICD-10-CM | POA: Diagnosis present

## 2017-11-30 LAB — CBC
HCT: 25.4 % — ABNORMAL LOW (ref 36.0–46.0)
Hemoglobin: 8 g/dL — ABNORMAL LOW (ref 12.0–15.0)
MCH: 25.4 pg — ABNORMAL LOW (ref 26.0–34.0)
MCHC: 31.5 g/dL (ref 30.0–36.0)
MCV: 80.6 fL (ref 78.0–100.0)
PLATELETS: 284 10*3/uL (ref 150–400)
RBC: 3.15 MIL/uL — ABNORMAL LOW (ref 3.87–5.11)
RDW: 16.6 % — AB (ref 11.5–15.5)
WBC: 5.4 10*3/uL (ref 4.0–10.5)

## 2017-11-30 LAB — COMPREHENSIVE METABOLIC PANEL
ALBUMIN: 2.6 g/dL — AB (ref 3.5–5.0)
ALT: 19 U/L (ref 14–54)
ANION GAP: 5 (ref 5–15)
AST: 17 U/L (ref 15–41)
Alkaline Phosphatase: 82 U/L (ref 38–126)
BUN: 12 mg/dL (ref 6–20)
CHLORIDE: 105 mmol/L (ref 101–111)
CO2: 25 mmol/L (ref 22–32)
Calcium: 7.9 mg/dL — ABNORMAL LOW (ref 8.9–10.3)
Creatinine, Ser: 0.84 mg/dL (ref 0.44–1.00)
GFR calc Af Amer: >60 mL/min (ref >60)
Glucose, Bld: 113 mg/dL — ABNORMAL HIGH (ref 65–99)
POTASSIUM: 3.6 mmol/L (ref 3.5–5.1)
Sodium: 135 mmol/L (ref 135–145)
TOTAL PROTEIN: 6.4 g/dL — AB (ref 6.5–8.1)
Total Bilirubin: 0.6 mg/dL (ref 0.3–1.2)

## 2017-11-30 LAB — GLUCOSE, CAPILLARY: GLUCOSE-CAPILLARY: 161 mg/dL — AB (ref 65–99)

## 2017-11-30 LAB — BRAIN NATRIURETIC PEPTIDE: B NATRIURETIC PEPTIDE 5: 110.4 pg/mL — AB (ref 0.0–100.0)

## 2017-11-30 MED ORDER — OXYCODONE-ACETAMINOPHEN 5-325 MG PO TABS
2.0000 | ORAL_TABLET | Freq: Once | ORAL | Status: AC
  Administered 2017-11-30: 2 via ORAL
  Filled 2017-11-30: qty 2

## 2017-11-30 MED ORDER — CALCIUM CARBONATE ANTACID 500 MG PO CHEW: 2.0000 | CHEWABLE_TABLET | ORAL | Status: DC | PRN

## 2017-11-30 MED ORDER — LACTATED RINGERS IV SOLN
INTRAVENOUS | Status: DC
  Administered 2017-12-01: 21:00:00 via INTRAVENOUS

## 2017-11-30 MED ORDER — AMLODIPINE BESYLATE 10 MG PO TABS
10.0000 mg | ORAL_TABLET | Freq: Once | ORAL | Status: AC
  Administered 2017-11-30: 10 mg via ORAL
  Filled 2017-11-30: qty 1

## 2017-11-30 MED ORDER — HYDRALAZINE HCL 20 MG/ML IJ SOLN
5.0000 mg | INTRAMUSCULAR | Status: AC | PRN
  Administered 2017-11-30: 5 mg via INTRAVENOUS
  Administered 2017-11-30: 10 mg via INTRAVENOUS
  Filled 2017-11-30: qty 1

## 2017-11-30 MED ORDER — DIPHENHYDRAMINE HCL 50 MG/ML IJ SOLN
50.0000 mg | Freq: Once | INTRAMUSCULAR | Status: AC
  Administered 2017-11-30: 50 mg via INTRAVENOUS
  Filled 2017-11-30: qty 1

## 2017-11-30 MED ORDER — PRENATAL MULTIVITAMIN CH
1.0000 | ORAL_TABLET | Freq: Every day | ORAL | Status: DC
  Administered 2017-12-02: 1 via ORAL
  Filled 2017-11-30: qty 1

## 2017-11-30 MED ORDER — ENOXAPARIN SODIUM 120 MG/0.8ML ~~LOC~~ SOLN
120.0000 mg | Freq: Once | SUBCUTANEOUS | Status: AC
  Administered 2017-11-30: 120 mg via SUBCUTANEOUS
  Filled 2017-11-30: qty 0.8

## 2017-11-30 MED ORDER — FUROSEMIDE 10 MG/ML IJ SOLN
40.0000 mg | Freq: Once | INTRAMUSCULAR | Status: AC
  Administered 2017-11-30: 40 mg via INTRAVENOUS
  Filled 2017-11-30: qty 4

## 2017-11-30 MED ORDER — FUROSEMIDE 10 MG/ML IJ SOLN
40.0000 mg | Freq: Three times a day (TID) | INTRAMUSCULAR | Status: DC
  Administered 2017-12-01: 40 mg via INTRAVENOUS
  Filled 2017-11-30 (×2): qty 4

## 2017-11-30 MED ORDER — IOPAMIDOL (ISOVUE-370) INJECTION 76%
100.0000 mL | Freq: Once | INTRAVENOUS | Status: AC | PRN
  Administered 2017-11-30: 100 mL via INTRAVENOUS

## 2017-11-30 MED ORDER — HYDROMORPHONE HCL 2 MG/ML IJ SOLN
2.0000 mg | Freq: Once | INTRAMUSCULAR | Status: AC
  Administered 2017-11-30: 2 mg via INTRAVENOUS
  Filled 2017-11-30: qty 1

## 2017-11-30 MED ORDER — LABETALOL HCL 5 MG/ML IV SOLN: 20.0000 mg | INTRAVENOUS | Status: DC | PRN

## 2017-11-30 MED ORDER — ACETAMINOPHEN 325 MG PO TABS
650.0000 mg | ORAL_TABLET | ORAL | Status: DC | PRN
  Administered 2017-12-01 – 2017-12-02 (×3): 650 mg via ORAL
  Filled 2017-11-30 (×3): qty 2

## 2017-11-30 MED ORDER — HYDRALAZINE HCL 20 MG/ML IJ SOLN
10.0000 mg | INTRAMUSCULAR | Status: DC | PRN
  Administered 2017-11-30 (×2): 10 mg via INTRAVENOUS
  Filled 2017-11-30 (×2): qty 1

## 2017-11-30 MED ORDER — OXYCODONE-ACETAMINOPHEN 5-325 MG PO TABS
1.0000 | ORAL_TABLET | ORAL | Status: DC | PRN
  Administered 2017-11-30: 2 via ORAL
  Administered 2017-12-01: 1 via ORAL
  Filled 2017-11-30: qty 1
  Filled 2017-11-30: qty 2

## 2017-11-30 MED ORDER — METHYLPREDNISOLONE SODIUM SUCC 40 MG IJ SOLR
40.0000 mg | Freq: Once | INTRAMUSCULAR | Status: AC
  Administered 2017-11-30: 40 mg via INTRAVENOUS
  Filled 2017-11-30: qty 1

## 2017-11-30 NOTE — MAU Note (Signed)
Pt states she is having pain on her left upper side.

## 2017-11-30 NOTE — Progress Notes (Signed)
Pt states she went to [facility] on 2/4 due to increased swelling of lower extremities. Pt has severe edema of both legs and feet today. Her other complaints today include headache x3 days as well as chest pain and wheezing which started yesterday. She denies visual disturbances. Staples removed from vertical and horizontal surgical incisions. Swelling noted @ horizontal incision and unsure if staples may be embedded within the noted area. Dr. Ilda Basset was called to see pt. Remaining staples removed. Both incisions well healed with no bleeding or drainage noted. Steri-strips applied. Pt advised that she requires further evaluation @ MAU for her elevated BP and headache today. Pt agreed to plan of care. She stated that she needs to pick up children from school and will return to hospital by approximately 4pm today.

## 2017-11-30 NOTE — MAU Provider Note (Signed)
History     CSN: [id number]  Arrival date and time: 11/30/17 1559   First Provider Initiated Contact with Patient 11/30/17 1636      Chief Complaint  Patient presents with  . Hypertension  . Headache   Jodi Mcgee is a 32 y.o. [phone] who is SP c-section X 10 days. She had a c-section on 11/20/17. She was seen in the ED on 11/26/17, and was given on dose of norvasc 5mg  in addition to the 5mg  she was already taking, but they did not send her home with more to increase the dose. She was also given lasix while she was at the ED on 11/28/17.  She is here today with shortness of breath, edema, headache and RUQ pain.    Hypertension  This is a new problem. The current episode started more than 1 month ago. The problem is unchanged. The problem is uncontrolled. Associated symptoms include headaches and shortness of breath. Associated agents: pregnancy  Past treatments include calcium channel blockers. The current treatment provides no improvement. There are no compliance problems.      Past Medical History:  Diagnosis Date  . Anemia   . Anxiety   . Blood transfusion without reported diagnosis    both CS  . Diabetes mellitus without complication ([place])   . GERD (gastroesophageal reflux disease)    has resolved  . IBS (irritable bowel syndrome)   . Ovarian cyst   . Peritonitis, acute generalized ([place])   . Sepsis [facility])     Past Surgical History:  Procedure Laterality Date  . APPENDECTOMY     ruptured   . CESAREAN SECTION    . CESAREAN SECTION Bilateral 11/20/2017   Procedure: CESAREAN SECTION;  Surgeon: Sloan Leiter, MD;  Location: [facility];  Service: Obstetrics;  Laterality: Bilateral;  . OVARIAN CYST DRAINAGE    . SMALL BOWEL REPAIR      Family History  Problem Relation Age of Onset  . Hypertension Mother   . Anemia Mother   . Diabetes Father     Social History   Tobacco Use  . Smoking status: Former Smoker    Packs/day: 0.50    Types: Cigarettes  .  Smokeless tobacco: Never Used  Substance Use Topics  . Alcohol use: No  . Drug use: No    Allergies:  Allergies  Allergen Reactions  . Cetirizine & Related Itching and Swelling  . Toradol [Ketorolac Tromethamine] Hives and Itching  . Contrast Media [Iodinated Diagnostic Agents] Itching    Mild itching after IV contrast on 6/1/7 No urticaria visible No wheezing No angioedema  . Nsaids Rash and Other (See Comments)    None per gi    Medications Prior to Admission  Medication Sig Dispense Refill Last Dose  . ACCU-CHEK FASTCLIX LANCETS MISC 1 Device by Percutaneous route 4 (four) times daily. QID as instructed 100 each 12 Taking  . acetaminophen (TYLENOL) 500 MG tablet Take 1,000 mg by mouth every 6 (six) hours as needed for moderate pain.   Taking  . amLODipine (NORVASC) 5 MG tablet Take 1 tablet (5 mg total) by mouth daily. 30 tablet 0 Taking  . cyclobenzaprine (FLEXERIL) 10 MG tablet Take 1 tablet (10 mg total) by mouth 3 (three) times daily as needed for muscle spasms. (Patient not taking: Reported on 11/30/2017) 30 tablet 5 Not Taking  . docusate sodium (COLACE) 100 MG capsule Take 1 capsule (100 mg total) by mouth 2 (two) times daily as needed. [place]  capsule 2 Taking  . furosemide (LASIX) 40 MG tablet Take 1 tablet (40 mg total) by mouth daily. 5 tablet 0 Taking  . gentamicin (GARAMYCIN) 0.3 % ophthalmic solution Place 2 drops into the left eye 3 (three) times daily. (Patient not taking: Reported on 11/30/2017) 5 mL 0 Not Taking  . HYDROcodone-acetaminophen (NORCO/VICODIN) 5-325 MG tablet Take 1-2 tablets by mouth every 6 (six) hours as needed. 10 tablet 0 Taking  . Iron-FA-B Cmp-C-Biot-Probiotic (FUSION PLUS) CAPS Take 1 tablet by mouth daily. (Patient not taking: Reported on 11/30/2017) 60 capsule 1 Not Taking  . metFORMIN (GLUCOPHAGE) 1000 MG tablet Take 1 tablet (1,000 mg total) by mouth 2 (two) times daily with a meal. 60 tablet 3 Taking  . oxyCODONE 10 MG TABS Take 1 tablet (10 mg  total) by mouth every 4 (four) hours as needed for moderate pain. 20 tablet 0 Taking  . pantoprazole (PROTONIX) 20 MG tablet Take 1 tablet (20 mg total) by mouth 2 (two) times daily. 60 tablet 1 Taking  . potassium chloride 20 MEQ TBCR Take 40 mEq by mouth daily. 10 tablet 0 Taking    Review of Systems  Constitutional: Negative for chills and fever.  Eyes: Negative for visual disturbance.  Respiratory: Positive for shortness of breath and wheezing.   Gastrointestinal: Positive for abdominal pain (RUQ pain ). Negative for nausea and vomiting.  Neurological: Positive for dizziness and headaches.   Physical Exam   Blood pressure (!) 176/110, pulse (!) 104, temperature 98.1 F (36.7 C), temperature source Oral, resp. rate 20, weight 266 lb (120.7 kg), SpO2 95 %, unknown if currently breastfeeding.  Physical Exam  Nursing note and vitals reviewed. Constitutional: She is oriented to person, place, and time. She appears well-developed and well-nourished. No distress.  HENT:  Head: Normocephalic.  Cardiovascular: Normal rate.  Respiratory: She has no rales.  Increased work of breathing noted  GI: Soft. There is no tenderness.  Neurological: She is alert and oriented to person, place, and time.  Skin: Skin is warm and dry.  Psychiatric: She has a normal mood and affect.     Results for orders placed or performed during the hospital encounter of 11/30/17 (from the past 24 hour(s))  Comprehensive metabolic panel     Status: Abnormal   Collection Time: 11/30/17  4:41 PM  Result Value Ref Range   Sodium 135 135 - 145 mmol/L   Potassium 3.6 3.5 - 5.1 mmol/L   Chloride 105 101 - 111 mmol/L   CO2 25 22 - 32 mmol/L   Glucose, Bld 113 (H) 65 - 99 mg/dL   BUN 12 6 - 20 mg/dL   Creatinine, Ser 0.84 0.44 - 1.00 mg/dL   Calcium 7.9 (L) 8.9 - 10.3 mg/dL   Total Protein 6.4 (L) 6.5 - 8.1 g/dL   Albumin 2.6 (L) 3.5 - 5.0 g/dL   AST 17 15 - 41 U/L   ALT 19 14 - 54 U/L   Alkaline Phosphatase 82  38 - 126 U/L   Total Bilirubin 0.6 0.3 - 1.2 mg/dL   GFR calc non Af Amer >60 >60 mL/min   GFR calc Af Amer >60 >60 mL/min   Anion gap 5 5 - 15   Dg Chest 2 View  Result Date: 11/30/2017 CLINICAL DATA:  Sudden onset of shortness of breath yesterday, decreased oxygen saturation, soft tissue swelling in BILATERAL lower extremities, 2 weeks postpartum, history hypertension, diabetes mellitus, former smoker EXAM: CHEST  2 VIEW COMPARISON:  11/27/2017  FINDINGS: Minimally prominent cardiac silhouette which may be related to recent postpartum state. Mediastinal contours and pulmonary vascularity normal. Peribronchial thickening with mild RIGHT basilar atelectasis. Question hazy infiltrate within the lingula. Remaining lungs grossly clear. No pleural effusion or pneumothorax. Bones unremarkable. IMPRESSION: Bronchitic changes with RIGHT basilar atelectasis and question subtle lingular infiltrate. Electronically Signed   By: Lavonia Dana M.D.   On: 11/30/2017 17:18   MAU Course  Procedures  MDM 1736: DW Dr. Elonda Husky, will give 40mg  Lasix IV and continue to monitor.  1845: Dr. Elonda Husky on the unit, and assessing patient. Will give lovenox 120mg  SQ now, and send for CT to R/O PE Admit to 3rd floor: lasix 40mg  TID, apresoline 10mg  q16min PRN, echo  Assessment and Plan  Pulmonary edema v PE v cardiomyopathy Admit to 3rd floor for aggressive blood pressure management and fluid reduction CT prior to admission Echo   Marcille Buffy 11/30/2017, 4:37 PM

## 2017-11-30 NOTE — MAU Note (Addendum)
John in [facility] radiology [phone]) notified that patient got solumedrol prep at 1934.

## 2017-11-30 NOTE — MAU Note (Signed)
4 hour prep for contrast allergy as follows per [facility] radiology: 40mg  solumedrol minimum 4 hours prior to contrast. 50mg  benadryl within the hour of the scan.

## 2017-11-30 NOTE — H&P (Signed)
CSN: [id number]  Arrival date and time: 11/30/17 1559   First Provider Initiated Contact with Patient 11/30/17 1636      Chief Complaint  Patient presents with  . Hypertension  . Headache   Jodi Mcgee is a 32 y.o. [phone] who is SP c-section X 10 days. She had a c-section on 11/20/17. She was seen in the ED on 11/26/17, and was given on dose of norvasc 5mg  in addition to the 5mg  she was already taking, but they did not send her home with more to increase the dose. She was also given lasix while she was at the ED on 11/28/17.  She is here today with shortness of breath, edema, headache and RUQ pain.    Hypertension  This is a new problem. The current episode started more than 1 month ago. The problem is unchanged. The problem is uncontrolled. Associated symptoms include headaches and shortness of breath. Associated agents: pregnancy  Past treatments include calcium channel blockers. The current treatment provides no improvement. There are no compliance problems.      Past Medical History:  Diagnosis Date  . Anemia   . Anxiety   . Blood transfusion without reported diagnosis    both CS  . Diabetes mellitus without complication ([place])   . GERD (gastroesophageal reflux disease)    has resolved  . IBS (irritable bowel syndrome)   . Ovarian cyst   . Peritonitis, acute generalized ([place])   . Sepsis [facility])     Past Surgical History:  Procedure Laterality Date  . APPENDECTOMY     ruptured   . CESAREAN SECTION    . CESAREAN SECTION Bilateral 11/20/2017   Procedure: CESAREAN SECTION;  Surgeon: Sloan Leiter, MD;  Location: [facility];  Service: Obstetrics;  Laterality: Bilateral;  . OVARIAN CYST DRAINAGE    . SMALL BOWEL REPAIR      Family History  Problem Relation Age of Onset  . Hypertension Mother   . Anemia Mother   . Diabetes Father     Social History   Tobacco Use  . Smoking status: Former Smoker    Packs/day: 0.50    Types: Cigarettes  . Smokeless  tobacco: Never Used  Substance Use Topics  . Alcohol use: No  . Drug use: No    Allergies:  Allergies  Allergen Reactions  . Cetirizine & Related Itching and Swelling  . Toradol [Ketorolac Tromethamine] Hives and Itching  . Contrast Media [Iodinated Diagnostic Agents] Itching    Mild itching after IV contrast on 6/1/7 No urticaria visible No wheezing No angioedema  . Nsaids Rash and Other (See Comments)    None per gi    Medications Prior to Admission  Medication Sig Dispense Refill Last Dose  . ACCU-CHEK FASTCLIX LANCETS MISC 1 Device by Percutaneous route 4 (four) times daily. QID as instructed 100 each 12 Taking  . acetaminophen (TYLENOL) 500 MG tablet Take 1,000 mg by mouth every 6 (six) hours as needed for moderate pain.   Taking  . amLODipine (NORVASC) 5 MG tablet Take 1 tablet (5 mg total) by mouth daily. 30 tablet 0 Taking  . cyclobenzaprine (FLEXERIL) 10 MG tablet Take 1 tablet (10 mg total) by mouth 3 (three) times daily as needed for muscle spasms. (Patient not taking: Reported on 11/30/2017) 30 tablet 5 Not Taking  . docusate sodium (COLACE) 100 MG capsule Take 1 capsule (100 mg total) by mouth 2 (two) times daily as needed. 30 capsule 2 Taking  .  furosemide (LASIX) 40 MG tablet Take 1 tablet (40 mg total) by mouth daily. 5 tablet 0 Taking  . gentamicin (GARAMYCIN) 0.3 % ophthalmic solution Place 2 drops into the left eye 3 (three) times daily. (Patient not taking: Reported on 11/30/2017) 5 mL 0 Not Taking  . HYDROcodone-acetaminophen (NORCO/VICODIN) 5-325 MG tablet Take 1-2 tablets by mouth every 6 (six) hours as needed. 10 tablet 0 Taking  . Iron-FA-B Cmp-C-Biot-Probiotic (FUSION PLUS) CAPS Take 1 tablet by mouth daily. (Patient not taking: Reported on 11/30/2017) 60 capsule 1 Not Taking  . metFORMIN (GLUCOPHAGE) 1000 MG tablet Take 1 tablet (1,000 mg total) by mouth 2 (two) times daily with a meal. 60 tablet 3 Taking  . oxyCODONE 10 MG TABS Take 1 tablet (10 mg total) by  mouth every 4 (four) hours as needed for moderate pain. 20 tablet 0 Taking  . pantoprazole (PROTONIX) 20 MG tablet Take 1 tablet (20 mg total) by mouth 2 (two) times daily. 60 tablet 1 Taking  . potassium chloride 20 MEQ TBCR Take 40 mEq by mouth daily. 10 tablet 0 Taking    Review of Systems  Constitutional: Negative for chills and fever.  Eyes: Negative for visual disturbance.  Respiratory: Positive for shortness of breath and wheezing.   Gastrointestinal: Positive for abdominal pain (RUQ pain ). Negative for nausea and vomiting.  Neurological: Positive for dizziness and headaches.   Physical Exam   Blood pressure (!) 176/110, pulse (!) 104, temperature 98.1 F (36.7 C), temperature source Oral, resp. rate 20, weight 266 lb (120.7 kg), SpO2 95 %, unknown if currently breastfeeding.  Physical Exam  Nursing note and vitals reviewed. Constitutional: She is oriented to person, place, and time. She appears well-developed and well-nourished. No distress.  HENT:  Head: Normocephalic.  Cardiovascular: Normal rate.  Respiratory: She has no rales.  Increased work of breathing noted  GI: Soft. There is no tenderness.  Neurological: She is alert and oriented to person, place, and time.  Skin: Skin is warm and dry.  Psychiatric: She has a normal mood and affect.     Results for orders placed or performed during the hospital encounter of 11/30/17 (from the past 24 hour(s))  Comprehensive metabolic panel     Status: Abnormal   Collection Time: 11/30/17  4:41 PM  Result Value Ref Range   Sodium 135 135 - 145 mmol/L   Potassium 3.6 3.5 - 5.1 mmol/L   Chloride 105 101 - 111 mmol/L   CO2 25 22 - 32 mmol/L   Glucose, Bld 113 (H) 65 - 99 mg/dL   BUN 12 6 - 20 mg/dL   Creatinine, Ser 0.84 0.44 - 1.00 mg/dL   Calcium 7.9 (L) 8.9 - 10.3 mg/dL   Total Protein 6.4 (L) 6.5 - 8.1 g/dL   Albumin 2.6 (L) 3.5 - 5.0 g/dL   AST 17 15 - 41 U/L   ALT 19 14 - 54 U/L   Alkaline Phosphatase 82 38 - 126  U/L   Total Bilirubin 0.6 0.3 - 1.2 mg/dL   GFR calc non Af Amer >60 >60 mL/min   GFR calc Af Amer >60 >60 mL/min   Anion gap 5 5 - 15   Dg Chest 2 View  Result Date: 11/30/2017 CLINICAL DATA:  Sudden onset of shortness of breath yesterday, decreased oxygen saturation, soft tissue swelling in BILATERAL lower extremities, 2 weeks postpartum, history hypertension, diabetes mellitus, former smoker EXAM: CHEST  2 VIEW COMPARISON:  11/27/2017 FINDINGS: Minimally prominent cardiac silhouette  which may be related to recent postpartum state. Mediastinal contours and pulmonary vascularity normal. Peribronchial thickening with mild RIGHT basilar atelectasis. Question hazy infiltrate within the lingula. Remaining lungs grossly clear. No pleural effusion or pneumothorax. Bones unremarkable. IMPRESSION: Bronchitic changes with RIGHT basilar atelectasis and question subtle lingular infiltrate. Electronically Signed   By: Lavonia Dana M.D.   On: 11/30/2017 17:18   MAU Course  Procedures  MDM 1736: DW Dr. Elonda Husky, will give 40mg  Lasix IV and continue to monitor.  1845: Dr. Elonda Husky on the unit, and assessing patient. Will give lovenox 120mg  SQ now, and send for CT to R/O PE Admit to 3rd floor: lasix 40mg  TID, apresoline 10mg  q31min PRN, echo  Assessment and Plan  Pulmonary edema v PE v cardiomyopathy Admit to 3rd floor for aggressive blood pressure management and fluid reduction CT prior to admission Echo   Marcille Buffy 11/30/2017, 4:37 PM

## 2017-11-30 NOTE — MAU Note (Signed)
Pt sent from clinic for HTN and headache. Headache started 3 days ago 10/10, on BP medication from delivery. Swelling in feet, no visual changes, pain in upper abdomen in center, SOB

## 2017-12-01 LAB — PROTEIN / CREATININE RATIO, URINE
CREATININE, URINE: 11 mg/dL
Total Protein, Urine: <6 mg/dL

## 2017-12-01 LAB — CBC
HCT: 27.9 % — ABNORMAL LOW (ref 36.0–46.0)
Hemoglobin: 8.8 g/dL — ABNORMAL LOW (ref 12.0–15.0)
MCH: 25 pg — AB (ref 26.0–34.0)
MCHC: 31.5 g/dL (ref 30.0–36.0)
MCV: 79.3 fL (ref 78.0–100.0)
PLATELETS: 335 10*3/uL (ref 150–400)
RBC: 3.52 MIL/uL — ABNORMAL LOW (ref 3.87–5.11)
RDW: 16.7 % — AB (ref 11.5–15.5)
WBC: 7.8 10*3/uL (ref 4.0–10.5)

## 2017-12-01 LAB — COMPREHENSIVE METABOLIC PANEL
ALT: 19 U/L (ref 14–54)
AST: 19 U/L (ref 15–41)
Albumin: 2.9 g/dL — ABNORMAL LOW (ref 3.5–5.0)
Alkaline Phosphatase: 84 U/L (ref 38–126)
Anion gap: 11 (ref 5–15)
BILIRUBIN TOTAL: 0.4 mg/dL (ref 0.3–1.2)
BUN: 17 mg/dL (ref 6–20)
CHLORIDE: 102 mmol/L (ref 101–111)
CO2: 24 mmol/L (ref 22–32)
Calcium: 8.3 mg/dL — ABNORMAL LOW (ref 8.9–10.3)
Creatinine, Ser: 0.81 mg/dL (ref 0.44–1.00)
Glucose, Bld: 123 mg/dL — ABNORMAL HIGH (ref 65–99)
POTASSIUM: 3.1 mmol/L — AB (ref 3.5–5.1)
Sodium: 137 mmol/L (ref 135–145)
TOTAL PROTEIN: 7 g/dL (ref 6.5–8.1)

## 2017-12-01 LAB — GLUCOSE, CAPILLARY: Glucose-Capillary: 105 mg/dL — ABNORMAL HIGH (ref 65–99)

## 2017-12-01 LAB — LACTATE DEHYDROGENASE: LDH: 205 U/L — AB (ref 98–192)

## 2017-12-01 MED ORDER — CYCLOBENZAPRINE HCL 10 MG PO TABS
10.0000 mg | ORAL_TABLET | Freq: Three times a day (TID) | ORAL | Status: DC
  Administered 2017-12-01 – 2017-12-02 (×2): 10 mg via ORAL
  Filled 2017-12-01 (×6): qty 1

## 2017-12-01 MED ORDER — MAGNESIUM SULFATE 40 G IN LACTATED RINGERS - SIMPLE
2.0000 g/h | INTRAVENOUS | Status: AC
  Administered 2017-12-01: 2 g/h via INTRAVENOUS
  Filled 2017-12-01: qty 40

## 2017-12-01 MED ORDER — MAGNESIUM SULFATE BOLUS VIA INFUSION
2.0000 g | Freq: Once | INTRAVENOUS | Status: AC
  Administered 2017-12-01: 2 g via INTRAVENOUS
  Filled 2017-12-01: qty 500

## 2017-12-01 MED ORDER — HYDROMORPHONE HCL 1 MG/ML IJ SOLN
1.0000 mg | Freq: Three times a day (TID) | INTRAMUSCULAR | Status: DC | PRN
  Administered 2017-12-01: 1 mg via INTRAVENOUS
  Administered 2017-12-02: 2 mg via INTRAVENOUS
  Filled 2017-12-01: qty 1
  Filled 2017-12-01: qty 2

## 2017-12-01 MED ORDER — IBUPROFEN 800 MG PO TABS: 800.0000 mg | ORAL_TABLET | Freq: Three times a day (TID) | ORAL | Status: DC

## 2017-12-01 MED ORDER — PROMETHAZINE HCL 25 MG/ML IJ SOLN
25.0000 mg | Freq: Once | INTRAMUSCULAR | Status: AC
  Administered 2017-12-01: 25 mg via INTRAVENOUS
  Filled 2017-12-01: qty 1

## 2017-12-01 MED ORDER — ENOXAPARIN SODIUM 120 MG/0.8ML ~~LOC~~ SOLN
1.0000 mg/kg | Freq: Two times a day (BID) | SUBCUTANEOUS | Status: DC
  Administered 2017-12-01 – 2017-12-02 (×3): 120 mg via SUBCUTANEOUS
  Filled 2017-12-01 (×5): qty 0.8

## 2017-12-01 MED ORDER — PROMETHAZINE HCL 25 MG/ML IJ SOLN: 25.0000 mg | Freq: Four times a day (QID) | INTRAMUSCULAR | Status: DC | PRN

## 2017-12-01 MED ORDER — TRIAMTERENE-HCTZ 75-50 MG PO TABS
1.0000 | ORAL_TABLET | Freq: Two times a day (BID) | ORAL | Status: DC
  Filled 2017-12-01: qty 1

## 2017-12-01 MED ORDER — FUROSEMIDE 10 MG/ML IJ SOLN
10.0000 mg | Freq: Two times a day (BID) | INTRAMUSCULAR | Status: AC
  Administered 2017-12-01 – 2017-12-02 (×2): 10 mg via INTRAVENOUS
  Filled 2017-12-01 (×2): qty 1

## 2017-12-01 MED ORDER — ZOLPIDEM TARTRATE 5 MG PO TABS
5.0000 mg | ORAL_TABLET | Freq: Every evening | ORAL | Status: DC | PRN
  Administered 2017-12-01: 5 mg via ORAL
  Filled 2017-12-01: qty 1

## 2017-12-01 MED ORDER — HYDROMORPHONE HCL 1 MG/ML IJ SOLN
1.0000 mg | Freq: Once | INTRAMUSCULAR | Status: AC
  Administered 2017-12-01: 1 mg via INTRAVENOUS
  Filled 2017-12-01: qty 1

## 2017-12-01 MED ORDER — TRIAMTERENE-HCTZ 37.5-25 MG PO TABS
2.0000 | ORAL_TABLET | Freq: Two times a day (BID) | ORAL | Status: DC
  Administered 2017-12-01 – 2017-12-02 (×4): 2 via ORAL
  Filled 2017-12-01 (×5): qty 2

## 2017-12-01 NOTE — Progress Notes (Signed)
Pt states that HA is again 10/10.  Pt encouraged to increased PO hydration because she has not had any water all day, pt also encouraged to eat and drink caffein to help with HA.  Pt states that nothing has helped but IV medication.  Non pharmacological relief measures discussed, pt reluctant and does not believe they will help.  No other neuro or Pre E symptoms at this time.

## 2017-12-01 NOTE — Plan of Care (Signed)
POC discussed with pt including appropriate pain control.  Pt states understanding and has no questions or concerns.

## 2017-12-01 NOTE — Progress Notes (Addendum)
Subjective:  POD#11. Pulmonary embolus Postpartum Day 11: Cesarean Delivery Patient reports feels better, less  Shortness of breath, swelling is improved.    Objective: Vital signs in last 24 hours: Temp:  [97.9 F (36.6 C)-98.1 F (36.7 C)] 98 F (36.7 C) (02/09 0603) Pulse Rate:  [97-126] 110 (02/09 0603) Resp:  [18-26] 18 (02/09 0603) BP: (127-183)/(85-117) 146/104 (02/09 0603) SpO2:  [87 %-100 %] 95 % (02/09 0700) Weight:  [263 lb (119.3 kg)-266 lb (120.7 kg)] 266 lb (120.7 kg) (02/08 2017)  Physical Exam:  General: alert, cooperative and no distress Lochia: appropriate Uterine Fundus: firm Incision: healing well DVT Evaluation: No evidence of DVT seen on physical exam. Lungs diminished breath sounds no rales  Recent Labs    11/30/17 1641  HGB 8.0*  HCT 25.4*    Assessment/Plan: Status post Cesarean section.  POD #11  Pre eclampsia:  BP much improved, norvasc 10(increased from 5mg )  Pulmonary embolus on lovenox 120 mg BID:  Feels much better, no O2 required, continue aggressive diuresis as well as the lovneox, anticiapte discharge tomorrow on lovenox, will need 6 months anticoagulation if stable .  Florian Buff 12/01/2017, 7:47 AM

## 2017-12-01 NOTE — Progress Notes (Signed)
MD notified of pt HA that continues to be 10/10 pain despite O2 supplementation for comfort and medication. VS WNL, no neuro Pre E symptoms.  Order given for PO flexeril.

## 2017-12-01 NOTE — Progress Notes (Signed)
Contacted OB attending about pt HA, VS and current status.  MD ordered IV medication.

## 2017-12-01 NOTE — Progress Notes (Signed)
Patient ID: Jodi Mcgee, female   DOB: [birth year], 32 y.o.   MRN: [id number] CTSP d/t to headache  Pt sitting up in bed talking on phone. No distress noted Pt reports bitemporal headache since Thursday. IV pain medication has helped in the past. Dark, quite and sleep seem to make better. No history of migraine headaches. Prior H/O PEC, magnesium after delivery 11 days ago. BP better with Norvasc. Breathing better as well. Edema improving Misses baby, [name].  PE AF VSS  O2 sat >95 % Lungs clear Heart RRR Abd soft + bs healing incisions Ext 1-2 + edema to patella  A/P POD # 11 C section        PE        Hypertension        HA  Discussed with pt treatment options. Will check labs, IV pain medication, additional lasix , start magnesium. Continue with other current treatments. Pt verbalized understanding and  Agreed wit POC. She appeared greatful as well.

## 2017-12-01 NOTE — Progress Notes (Signed)
Pt offered flexeril for HA, pt refused.  Pt states only thing that has helped is IV pain medication.  Pt continues to rate HA at 10/10.  Further suggestion for non pharmacological relief refused by pt.

## 2017-12-02 ENCOUNTER — Inpatient Hospital Stay (HOSPITAL_COMMUNITY): Payer: Medicaid Other

## 2017-12-02 DIAGNOSIS — I361 Nonrheumatic tricuspid (valve) insufficiency: Secondary | ICD-10-CM

## 2017-12-02 LAB — CBC
HEMATOCRIT: 29.4 % — AB (ref 36.0–46.0)
HEMOGLOBIN: 9.2 g/dL — AB (ref 12.0–15.0)
MCH: 24.9 pg — ABNORMAL LOW (ref 26.0–34.0)
MCHC: 31.3 g/dL (ref 30.0–36.0)
MCV: 79.5 fL (ref 78.0–100.0)
Platelets: 347 10*3/uL (ref 150–400)
RBC: 3.7 MIL/uL — AB (ref 3.87–5.11)
RDW: 16.9 % — ABNORMAL HIGH (ref 11.5–15.5)
WBC: 6.4 10*3/uL (ref 4.0–10.5)

## 2017-12-02 LAB — ECHOCARDIOGRAM COMPLETE
HEIGHTINCHES: 66 in
Weight: 4256 oz

## 2017-12-02 MED ORDER — OXYCODONE HCL 5 MG PO TABS
10.0000 mg | ORAL_TABLET | Freq: Four times a day (QID) | ORAL | Status: DC | PRN
  Administered 2017-12-02: 10 mg via ORAL
  Filled 2017-12-02: qty 2

## 2017-12-02 MED ORDER — OXYCODONE HCL 5 MG PO TABS: 5.0000 mg | ORAL_TABLET | ORAL | 0 refills | Status: DC | PRN

## 2017-12-02 MED ORDER — AMLODIPINE BESYLATE 10 MG PO TABS: 10.0000 mg | ORAL_TABLET | Freq: Every day | ORAL | 1 refills | Status: DC

## 2017-12-02 MED ORDER — TRIAMTERENE-HCTZ 37.5-25 MG PO TABS: 2.0000 | ORAL_TABLET | Freq: Every day | ORAL | 1 refills | Status: DC

## 2017-12-02 MED ORDER — ENOXAPARIN SODIUM 120 MG/0.8ML ~~LOC~~ SOLN: 1.0000 mg/kg | Freq: Two times a day (BID) | SUBCUTANEOUS | 1 refills | Status: DC

## 2017-12-02 NOTE — Discharge Instructions (Signed)
Pulmonary Embolism A pulmonary embolism (PE) is a sudden blockage or decrease of blood flow in one lung or both lungs. Most blockages come from a blood clot that forms in a lower leg, thigh, or arm vein (deep vein thrombosis, DVT) and travels to the lungs. A clot is blood that has thickened into a gel or solid. PE is a dangerous and life-threatening condition that needs to be treated right away. What are the causes? This condition is usually caused by a blood clot that forms in a vein and moves to the lungs. In rare cases, it may be caused by air, fat, part of a tumor, or other tissue that moves through the veins and into the lungs. What increases the risk? The following factors may make you more likely to develop this condition:  Having DVT or a history of DVT.  Being older than age 60.  Personal or family history of blood clots or blood clotting disease.  Major or lengthy surgery.  Orthopedic surgery, especially hip or knee replacement.  Traumatic injury, such as breaking a hip or leg.  Spinal cord injury.  Stroke.  Taking medicines that contain estrogen. These include birth control pills and hormone replacement therapy.  Long-term (chronic) lung or heart disease.  Cancer and chemotherapy.  Having a central venous catheter.  Pregnancy and the period after delivery.  What are the signs or symptoms? Symptoms of this condition usually start suddenly and include:  Shortness of breath while active or at rest.  Coughing or coughing up blood or blood-tinged mucus.  Chest pain that is often worse with deep breaths.  Rapid or irregular heartbeat.  Feeling light-headed or dizzy.  Fainting.  Feeling anxious.  Sweating.  Pain and swelling in a leg. This is a symptom of DVT, which can lead to PE.  How is this diagnosed? This condition may be diagnosed based on:  Your medical history.  A physical exam.  Blood tests to check blood oxygen level and how well your blood  clots, and a D-dimer blood test, which checks your blood for a substance that is released when a blood clot breaks apart.  CT pulmonary angiogram. This test checks blood flow in and around your lungs.  Ventilation-perfusion scan, also called a lung VQ scan. This test measures air flow and blood flow to the lungs.  Ultrasound of the legs to look for blood clots.  How is this treated? Treatment for this conditions depends on many factors, such as the cause of your PE, your risk for bleeding or developing more clots, and other medical conditions you have. Treatment aims to remove, dissolve, or stop blood clots from forming or growing larger. Treatment may include:  Blood thinning medicines (anticoagulants) to stop clots from forming or growing. These medicines may be given as a pill, as an injection, or through an IV tube (infusion).  Medicines that dissolve clots (thrombolytics).  A procedure in which a flexible tube is used to remove a blood clot (embolectomy) or deliver medicine to destroy it (catheter-directed thrombolysis).  A procedure in which a filter is inserted into a large vein that carries blood to the heart (inferior vena cava). This filter (vena cava filter) catches blood clots before they reach the lungs.  Surgery to remove the clot (surgical embolectomy). This is rare.  You may need a combination of immediate, long-term (up to 3 months after diagnosis), and extended (more than 3 months after diagnosis) treatments. Your treatment may continue for several months (maintenance therapy).   You and your health care provider will work together to choose the treatment program that is best for you. Follow these instructions at home: If you are taking an anticoagulant medicine:  Take the medicine every day at the same time each day.  Understand what foods and drugs interact with your medicine.  Understand the side effects of this medicine, including excessive bruising or bleeding. Ask  your health care provider or pharmacist about other side effects. General instructions  Take over-the-counter and prescription medicines only as told by your health care provider.  Anticoagulant medicines may cause side effects, including easy bruising and difficulty stopping bleeding. If you are prescribed an anticoagulant: ? Hold pressure over cuts for longer than usual. ? Tell your dentist and other health care providers that you are taking anticoagulants before you have any procedure that may cause bleeding. ? Avoid contact sports. ? Be extra careful when handling sharp objects. ? Use a soft toothbrush. Floss with waxed dental floss. ? Shave with an electric razor.  Wear a medical alert bracelet or carry a medical alert card that says you have had a PE.  Ask your health care provider when you may return to your normal activities.  Talk with your health care provider about any travel plans. It is important to make sure that you are still able to take your medicine while on trips.  Keep all follow-up visits as told by your health care provider. This is important. How is this prevented? Take these actions to lower your risk of developing another PE:  Exercise regularly. Take frequent walks. For at least 30 minutes every day, engage in: ? Activity that involves moving your arms and legs. ? Activity that encourages good blood flow through your body by increasing your heart rate.  While traveling, drink plenty of water and avoid drinking alcohol. Ask your health care provider if you should wear below-the-knee compression stockings.  Avoid sitting or lying in bed for long periods of time without moving your legs. Exercise your arms and legs every hour during long-distance travel (over 4 hours).  If you are hospitalized or have surgery, ask your health care provider about your risks and what treatments can help prevent blood clots.  Maintain a healthy weight. Ask your health care  provider what weight is healthy for you.  If you are a woman who is over age 35, avoid unnecessary use of medicines that contain estrogen, including birth control pills.  Do not use any products that contain nicotine or tobacco, such as cigarettes and e-cigarettes. This is especially important if you take estrogen medicines. If you need help quitting, ask your health care provider.  See your health care provider for regular checkups. This may include blood tests and ultrasound testing on your legs to check for new blood clots.  Contact a health care provider if:  You missed a dose of your blood thinner medicine. Get help right away if:  You have new or increased pain, swelling, warmth, or redness in an arm or leg.  You have numbness or tingling in an arm or leg.  You have shortness of breath while active or at rest.  You have chest pain.  You have a rapid or irregular heartbeat.  You feel light-headed or dizzy.  You cough up blood.  You have blood in your vomit, stool, or urine.  You have a fever.  You have abdomen (abdominal) pain.  You have a severe fall or head injury.  You have a   severe headache.  You have vision changes.  You cannot move your arms or legs.  You are confused or have memory loss.  You are bleeding for 10 minutes or more, even with strong pressure on the wound. These symptoms may represent a serious problem that is an emergency. Do not wait to see if the symptoms will go away. Get medical help right away. Call your local emergency services (911 in the [place] Do not drive yourself to the hospital. Summary  A pulmonary embolism (PE) is a sudden blockage or decrease of blood flow in one lung or both lungs. PE is a dangerous and life-threatening condition that needs to be treated right away.  Having deep vein thrombosis (DVT) or a history of DVT is the most common risk factor for PE.  Treatments for this condition usually include medicines to thin  your blood (anticoagulants) or medicines to break apart blood clots (thrombolytics).  If you are prescribed blood thinners, it is important to take the medicine every single day at the same time each day.  If you have signs of PE or DVT, call your local emergency services (911 in the [place] This information is not intended to replace advice given to you by your health care provider. Make sure you discuss any questions you have with your health care provider. Document Released: 10/06/2000 Document Revised: 11/11/2016 Document Reviewed: 11/11/2016 Elsevier Interactive Patient Education  2018 [employer]  

## 2017-12-02 NOTE — Discharge Summary (Signed)
Physician Discharge Summary  Patient ID: Jodi Mcgee MRN: [id number] DOB/AGE: 1986/07/07 32 y.o.  Admit date: 11/30/2017 Discharge date: 12/02/2017  Admission Diagnoses: POD #10 Caesarean section Pulmonary embolus vs pulmonary edema  Discharge Diagnoses:  Pulmonary embolus suboptimal BP control Edema  Discharged Condition: stable  Hospital Course: pt was admitted for presumptive PE which was confirmed by CT scan Managed with lovenox BP was also suboptimally controlled so Norvasc bumped for 5 to 10 mg, underwent diuresis with lasix and discharged on dyazide At discharge swelling much better, 02 sats >95% on RA with no SOB  Consults: None  Significant Diagnostic Studies: CT  Treatments: lovenox, lasix, norvasc  Discharge Exam: Blood pressure 134/88, pulse 94, temperature 98.4 F (36.9 C), temperature source Oral, resp. rate 20, height 5\' 6"  (1.676 m), weight 266 lb (120.7 kg), SpO2 95 %, unknown if currently breastfeeding. General appearance: alert, cooperative and no distress GI: soft, non-tender; bowel sounds normal; no masses,  no organomegaly Incision/Wound:clean dry intact  Disposition: 01-Home or Self Care  Discharge Instructions    Call MD for:  persistant nausea and vomiting   Complete by:  As directed    Call MD for:  severe uncontrolled pain   Complete by:  As directed    Call MD for:  temperature >100.4   Complete by:  As directed    Diet - low sodium heart healthy   Complete by:  As directed    Discharge wound care:   Complete by:  As directed    Keep clean and dry   Increase activity slowly   Complete by:  As directed      Allergies as of 12/02/2017      Reactions   Cetirizine & Related Itching, Swelling   Toradol [ketorolac Tromethamine] Hives, Itching   Contrast Media [iodinated Diagnostic Agents] Itching   Mild itching after IV contrast on 6/1/7 No urticaria visible No wheezing No angioedema   Nsaids Rash, Other (See Comments)   None per  gi      Medication List    STOP taking these medications   ACCU-CHEK FASTCLIX LANCETS Misc   acetaminophen 500 MG tablet Commonly known as:  TYLENOL   furosemide 40 MG tablet Commonly known as:  LASIX   HYDROcodone-acetaminophen 5-325 MG tablet Commonly known as:  NORCO/VICODIN   Potassium Chloride ER 20 MEQ Tbcr     TAKE these medications   amLODipine 10 MG tablet Commonly known as:  NORVASC Take 1 tablet (10 mg total) by mouth daily. What changed:    medication strength  how much to take   cyclobenzaprine 10 MG tablet Commonly known as:  FLEXERIL Take 1 tablet (10 mg total) by mouth 3 (three) times daily as needed for muscle spasms.   docusate sodium 100 MG capsule Commonly known as:  COLACE Take 1 capsule (100 mg total) by mouth 2 (two) times daily as needed. What changed:  reasons to take this   enoxaparin 120 MG/0.8ML injection Commonly known as:  LOVENOX Inject 0.8 mLs (120 mg total) into the skin every 12 (twelve) hours.   FUSION PLUS Caps Take 1 tablet by mouth daily.   gentamicin 0.3 % ophthalmic solution Commonly known as:  GARAMYCIN Place 2 drops into the left eye 3 (three) times daily.   metFORMIN 1000 MG tablet Commonly known as:  GLUCOPHAGE Take 1 tablet (1,000 mg total) by mouth 2 (two) times daily with a meal.   oxyCODONE 5 MG immediate release tablet Commonly known as:  Oxy IR/ROXICODONE Take 1 tablet (5 mg total) by mouth every 4 (four) hours as needed for severe pain. What changed:    medication strength  how much to take  reasons to take this   pantoprazole 20 MG tablet Commonly known as:  PROTONIX Take 1 tablet (20 mg total) by mouth 2 (two) times daily.   triamterene-hydrochlorothiazide 37.5-25 MG tablet Commonly known as:  MAXZIDE-25 Take 2 tablets by mouth daily.            Discharge Care Instructions  (From admission, onward)        Start     Ordered   12/02/17 0000  Discharge wound care:    Comments:  Keep  clean and dry   12/02/17 [place] Follow up on 12/10/2017.   Contact information: [place] [id number] [phone]          Signed: Florian Buff 12/02/2017, 5:06 PM

## 2017-12-02 NOTE — Progress Notes (Signed)
Echocardiogram 2D Echocardiogram has been performed.  Jodi Mcgee 12/02/2017, 11:40 AM

## 2017-12-02 NOTE — Progress Notes (Signed)
All discharge teaching completed with the patient. All printed discharge instructions given and explained to the patient. Patient verbalizes an understanding of all instructions given. No questions or concerns voiced at this time.

## 2017-12-02 NOTE — Progress Notes (Signed)
Subjective: Postpartum Day 12: Cesarean Delivery Admitted with PE Patient reports headache is some better but still 5/10. Reports some chest soreness. Denies SOB.   Objective: Vital signs in last 24 hours: Temp:  [97.9 F (36.6 C)-99.3 F (37.4 C)] 98.5 F (36.9 C) (02/09 2046) Pulse Rate:  [91-116] 95 (02/10 0400) Resp:  [18-26] 20 (02/10 0700) BP: (124-159)/(71-94) 133/80 (02/10 0400) SpO2:  [92 %-100 %] 98 % (02/10 0700)  Physical Exam:  General: alert Lochia: appropriate Uterine Fundus: firm Incision: healing well DVT Evaluation: No evidence of DVT seen on physical exam. Edema 1+  Recent Labs    12/01/17 1957 12/02/17 0518  HGB 8.8* 9.2*  HCT 27.9* 29.4*    Assessment/Plan: Status post Cesarean section. Day 12 PE. Stable on Lovenox. ECHO ordered PEC. Labs without evidence of HELLP. Started on magnesium last evening d/t persistent headache. Improved. BP stable on Norvasc. Pt has been requesting IV pain medication instead of oral pain medication. Discussed with pt need to try oral pain medication before IV pain medication. Will change to IR oxycodone. Will reevaluate later today for possible d/c home.     Jodi Mcgee 12/02/2017, 7:34 AM

## 2017-12-02 NOTE — Progress Notes (Signed)
Pt rated 10/10 pain for HA. Percocet offered but pt refused. Pt states she wants IV pain medication. Explained to pt that she could have IV pain medication after trying percocet but pt refused.

## 2017-12-02 NOTE — Plan of Care (Signed)
Patient is resting in bed at this time. Patient denies any pain or discomfort. Patient denies any blurry vision or headache at this time. Patient is progressing as expected

## 2017-12-03 ENCOUNTER — Encounter: Payer: Self-pay | Admitting: Obstetrics and Gynecology

## 2017-12-03 ENCOUNTER — Other Ambulatory Visit: Payer: Self-pay

## 2017-12-04 ENCOUNTER — Other Ambulatory Visit: Payer: Self-pay | Admitting: Obstetrics and Gynecology

## 2017-12-04 DIAGNOSIS — O8823 Thromboembolism in the puerperium: Secondary | ICD-10-CM | POA: Insufficient documentation

## 2017-12-07 ENCOUNTER — Ambulatory Visit: Payer: Self-pay

## 2017-12-10 ENCOUNTER — Ambulatory Visit: Payer: Self-pay

## 2017-12-15 ENCOUNTER — Other Ambulatory Visit: Payer: Self-pay

## 2017-12-15 ENCOUNTER — Emergency Department (HOSPITAL_COMMUNITY): Payer: Medicaid Other

## 2017-12-15 ENCOUNTER — Encounter (HOSPITAL_COMMUNITY): Payer: Self-pay | Admitting: *Deleted

## 2017-12-15 ENCOUNTER — Emergency Department (HOSPITAL_COMMUNITY)
Admission: EM | Admit: 2017-12-15 | Discharge: 2017-12-15 | Disposition: A | Discharge status: Home / Self Care | Payer: Medicaid Other | Attending: Emergency Medicine | Admitting: Emergency Medicine

## 2017-12-15 DIAGNOSIS — R0789 Other chest pain: Secondary | ICD-10-CM | POA: Diagnosis not present

## 2017-12-15 DIAGNOSIS — Z7984 Long term (current) use of oral hypoglycemic drugs: Secondary | ICD-10-CM | POA: Diagnosis not present

## 2017-12-15 DIAGNOSIS — N939 Abnormal uterine and vaginal bleeding, unspecified: Secondary | ICD-10-CM | POA: Insufficient documentation

## 2017-12-15 DIAGNOSIS — R0602 Shortness of breath: Secondary | ICD-10-CM | POA: Diagnosis not present

## 2017-12-15 DIAGNOSIS — Z79899 Other long term (current) drug therapy: Secondary | ICD-10-CM | POA: Insufficient documentation

## 2017-12-15 DIAGNOSIS — Z7901 Long term (current) use of anticoagulants: Secondary | ICD-10-CM | POA: Diagnosis not present

## 2017-12-15 DIAGNOSIS — R109 Unspecified abdominal pain: Secondary | ICD-10-CM | POA: Insufficient documentation

## 2017-12-15 DIAGNOSIS — E119 Type 2 diabetes mellitus without complications: Secondary | ICD-10-CM | POA: Insufficient documentation

## 2017-12-15 DIAGNOSIS — R079 Chest pain, unspecified: Secondary | ICD-10-CM

## 2017-12-15 DIAGNOSIS — Z87891 Personal history of nicotine dependence: Secondary | ICD-10-CM | POA: Diagnosis not present

## 2017-12-15 LAB — CBC WITH DIFFERENTIAL/PLATELET
Basophils Absolute: 0 10*3/uL (ref 0.0–0.1)
Basophils Relative: 0 %
Eosinophils Absolute: 0.1 10*3/uL (ref 0.0–0.7)
Eosinophils Relative: 2 %
HEMATOCRIT: 35.1 % — AB (ref 36.0–46.0)
HEMOGLOBIN: 10.7 g/dL — AB (ref 12.0–15.0)
LYMPHS ABS: 1.3 10*3/uL (ref 0.7–4.0)
LYMPHS PCT: 27 %
MCH: 24.6 pg — AB (ref 26.0–34.0)
MCHC: 30.5 g/dL (ref 30.0–36.0)
MCV: 80.7 fL (ref 78.0–100.0)
Monocytes Absolute: 0.2 10*3/uL (ref 0.1–1.0)
Monocytes Relative: 5 %
NEUTROS ABS: 3.3 10*3/uL (ref 1.7–7.7)
NEUTROS PCT: 66 %
Platelets: 510 10*3/uL — ABNORMAL HIGH (ref 150–400)
RBC: 4.35 MIL/uL (ref 3.87–5.11)
RDW: 16 % — ABNORMAL HIGH (ref 11.5–15.5)
WBC: 5 10*3/uL (ref 4.0–10.5)

## 2017-12-15 LAB — COMPREHENSIVE METABOLIC PANEL
ALK PHOS: 87 U/L (ref 38–126)
ALT: 31 U/L (ref 14–54)
AST: 22 U/L (ref 15–41)
Albumin: 4.3 g/dL (ref 3.5–5.0)
Anion gap: 11 (ref 5–15)
BILIRUBIN TOTAL: 0.3 mg/dL (ref 0.3–1.2)
BUN: 17 mg/dL (ref 6–20)
CALCIUM: 9.1 mg/dL (ref 8.9–10.3)
CO2: 27 mmol/L (ref 22–32)
CREATININE: 1.04 mg/dL — AB (ref 0.44–1.00)
Chloride: 99 mmol/L — ABNORMAL LOW (ref 101–111)
GFR calc non Af Amer: >60 mL/min (ref >60)
Glucose, Bld: 100 mg/dL — ABNORMAL HIGH (ref 65–99)
Potassium: 3.9 mmol/L (ref 3.5–5.1)
Sodium: 137 mmol/L (ref 135–145)
TOTAL PROTEIN: 8.3 g/dL — AB (ref 6.5–8.1)

## 2017-12-15 LAB — I-STAT TROPONIN, ED: Troponin i, poc: 0 ng/mL (ref 0.00–0.08)

## 2017-12-15 MED ORDER — ENOXAPARIN SODIUM 120 MG/0.8ML ~~LOC~~ SOLN
120.0000 mg | Freq: Once | SUBCUTANEOUS | Status: AC
  Administered 2017-12-15: 120 mg via SUBCUTANEOUS
  Filled 2017-12-15: qty 0.8

## 2017-12-15 MED ORDER — OXYCODONE-ACETAMINOPHEN 5-325 MG PO TABS
2.0000 | ORAL_TABLET | Freq: Once | ORAL | Status: AC
  Administered 2017-12-15: 2 via ORAL
  Filled 2017-12-15: qty 2

## 2017-12-15 NOTE — Discharge Instructions (Signed)
You presented to the emergency department with worsening shortness of breath and chest pain.  You recently had a PE and supposed to be on blood thinners along with your other medications.  Unfortunately we were not able to get your medicines for the last few days.  We did some blood work, chest x-ray, EKG that did not show anything obvious that you need to be admitted to the hospital for.  We have given you dose of Lovenox and you will need to restart your medicine tomorrow.  Please follow-up with your doctors and return if any worsening symptoms.

## 2017-12-15 NOTE — ED Notes (Signed)
Bed: RM30 Expected date:  Expected time:  Means of arrival:  Comments: 32 yo SOB, not taking thinners w/ hx of PE

## 2017-12-15 NOTE — ED Triage Notes (Signed)
Pt bib EMS and coming from home.  Pt had c-section on Jan 20 and then developed bilateral PE in her lungs.  The clots in her lungs broke up but the clots have dispersed to other parts of her body.  She was told to continue her Lovenox but has been unable to for the past two days.  Pt's medications are locked up at her family's house and they went out of town.  Pt has not had a total of 4 doses.  Pt's O2 with EMS was 100% RA.  Pt reports R sided chest pain that increases with movement and deep inspiration. EMS EKG was unremarkable. Pt a/o x 4 and ambulatory on arrival to ED.  EMS VS: BP: 115/79, HR:95  RR: 18, 100% RA, CBG: 157

## 2017-12-15 NOTE — ED Provider Notes (Signed)
[facility] DEPT Provider Note   CSN: [id number] Arrival date & time: 12/15/17  1529     History   Chief Complaint Chief Complaint  Patient presents with  . Shortness of Breath    HPI Jodi Mcgee is a 32 y.o. female.  She had a C-section delivery for a and return to the ED with increased shortness of breath and found to have had a PE.  She is been on Lovenox since then.  She tells me that for the last 3 days she has had no access to her medicines as they are close and locked up while they are on vacation.  She presents here complaining of increased shortness of breath and right-sided chest pain and some vaginal bleeding with some clot.  She will be able to have access to her medicines probably tomorrow afternoon.  It is unclear why she is on Lovenox and has not been transitioned onto any oral medicines.  The history is provided by the patient.  Shortness of Breath  This is a recurrent problem. The average episode lasts 3 days. The problem occurs frequently.The problem has not changed since onset.Associated symptoms include chest pain and abdominal pain. Pertinent negatives include no fever, no headaches, no rhinorrhea, no sore throat, no ear pain, no neck pain, no cough, no sputum production, no hemoptysis, no wheezing, no syncope, no vomiting, no rash, no leg pain, no leg swelling and no claudication. She has tried nothing for the symptoms. She has had prior hospitalizations. She has had prior ED visits. Associated medical issues include PE.    Past Medical History:  Diagnosis Date  . Anemia   . Anxiety   . Blood transfusion without reported diagnosis    both CS  . Diabetes mellitus without complication ([place])   . GERD (gastroesophageal reflux disease)    has resolved  . IBS (irritable bowel syndrome)   . Ovarian cyst   . Peritonitis, acute generalized ([place])   . Sepsis [facility])     Patient Active Problem List   Diagnosis Date Noted  .  Pulmonary embolism during puerperium 12/04/2017  . Pulmonary edema 11/30/2017  . S/P cesarean section 11/21/2017  . Back pain affecting pregnancy in third trimester 11/16/2017  . Non-compliant pregnant patient, third trimester 11/05/2017  . Pelvic adhesive disease 10/22/2017  . LGA (large for gestational age) fetus affecting management of mother 10/22/2017  . Anemia affecting pregnancy in third trimester 10/11/2017  . Obesity (BMI 30-39.9) 09/25/2017  . Polyhydramnios affecting pregnancy in third trimester 09/25/2017  . Hypertension in pregnancy, preeclampsia, severe, delivered/postpartum 09/13/2017  . Previous cesarean section complicating pregnancy 41/96/2229  . Supervision of high risk pregnancy, antepartum, third trimester 08/23/2017  . Insufficient prenatal care 08/23/2017  . Type 2 diabetes mellitus complicating pregnancy, antepartum 08/23/2017  . Chronic pain syndrome 08/23/2017    Past Surgical History:  Procedure Laterality Date  . APPENDECTOMY     ruptured   . CESAREAN SECTION    . CESAREAN SECTION Bilateral 11/20/2017   Procedure: CESAREAN SECTION;  Surgeon: Sloan Leiter, MD;  Location: [place];  Service: Obstetrics;  Laterality: Bilateral;  . OVARIAN CYST DRAINAGE    . SMALL BOWEL REPAIR      OB History    Gravida Para Term Preterm AB Living   3 3 1 2  0 3   SAB TAB Ectopic Multiple Live Births   0 0 0 0 3       Home Medications  Prior to Admission medications   Medication Sig Start Date End Date Taking? Authorizing Provider  amLODipine (NORVASC) 10 MG tablet Take 1 tablet (10 mg total) by mouth daily. 12/02/17   Florian Buff, MD  cyclobenzaprine (FLEXERIL) 10 MG tablet Take 1 tablet (10 mg total) by mouth 3 (three) times daily as needed for muscle spasms. Patient not taking: Reported on 11/30/2017 11/12/17   Osborne Oman, MD  docusate sodium (COLACE) 100 MG capsule Take 1 capsule (100 mg total) by mouth 2 (two) times daily as needed. Patient  taking differently: Take 100 mg by mouth 2 (two) times daily as needed for mild constipation.  11/23/17   Constant, Peggy, MD  enoxaparin (LOVENOX) 120 MG/0.8ML injection Inject 0.8 mLs (120 mg total) into the skin every 12 (twelve) hours. 12/02/17   Florian Buff, MD  gentamicin (GARAMYCIN) 0.3 % ophthalmic solution Place 2 drops into the left eye 3 (three) times daily. Patient not taking: Reported on 11/30/2017 11/08/17   Chancy Milroy, MD  Iron-FA-B Cmp-C-Biot-Probiotic (FUSION PLUS) CAPS Take 1 tablet by mouth daily. Patient not taking: Reported on 11/30/2017 11/05/17   Aletha Halim, MD  metFORMIN (GLUCOPHAGE) 1000 MG tablet Take 1 tablet (1,000 mg total) by mouth 2 (two) times daily with a meal. 08/23/17   Constant, Peggy, MD  oxyCODONE (OXY IR/ROXICODONE) 5 MG immediate release tablet Take 1 tablet (5 mg total) by mouth every 4 (four) hours as needed for severe pain. 12/02/17   Florian Buff, MD  pantoprazole (PROTONIX) 20 MG tablet Take 1 tablet (20 mg total) by mouth 2 (two) times daily. Patient not taking: Reported on 12/01/2017 11/02/17   Wende Mott, CNM  triamterene-hydrochlorothiazide (MAXZIDE-25) 37.5-25 MG tablet Take 2 tablets by mouth daily. 12/02/17   Florian Buff, MD    Family History Family History  Problem Relation Age of Onset  . Hypertension Mother   . Anemia Mother   . Diabetes Father     Social History Social History   Tobacco Use  . Smoking status: Former Smoker    Packs/day: 0.50    Types: Cigarettes  . Smokeless tobacco: Never Used  Substance Use Topics  . Alcohol use: No  . Drug use: No     Allergies   Cetirizine & related; Toradol [ketorolac tromethamine]; Contrast media [iodinated diagnostic agents]; and Nsaids   Review of Systems Review of Systems  Constitutional: Negative for chills and fever.  HENT: Negative for ear pain, rhinorrhea and sore throat.   Eyes: Negative for pain and visual disturbance.  Respiratory: Positive for shortness of  breath. Negative for cough, hemoptysis, sputum production and wheezing.   Cardiovascular: Positive for chest pain. Negative for palpitations, claudication, leg swelling and syncope.  Gastrointestinal: Positive for abdominal pain. Negative for vomiting.  Genitourinary: Positive for vaginal bleeding. Negative for dysuria and hematuria.  Musculoskeletal: Negative for arthralgias, back pain and neck pain.  Skin: Negative for color change and rash.  Neurological: Negative for seizures, syncope and headaches.  All other systems reviewed and are negative.    Physical Exam Updated Vital Signs BP 107/77 (BP Location: Right Arm)   Pulse 95   Temp 98.5 F (36.9 C) (Oral)   Resp 10   SpO2 100%   Physical Exam  Constitutional: She appears well-developed and well-nourished. No distress.  HENT:  Head: Normocephalic and atraumatic.  Mouth/Throat: Oropharynx is clear and moist.  Eyes: Conjunctivae and EOM are normal. Pupils are equal, round, and reactive to light.  Neck: Normal range of motion. Neck supple.  Cardiovascular: Normal rate and regular rhythm.  No murmur heard. Pulmonary/Chest: Effort normal and breath sounds normal. No accessory muscle usage. No tachypnea. No respiratory distress.  Abdominal: Soft. She exhibits no mass. There is no tenderness. There is no rebound.  Musculoskeletal: She exhibits no edema.       Right lower leg: She exhibits no tenderness.       Left lower leg: She exhibits no tenderness.  Neurological: She is alert.  Skin: Skin is warm and dry. Capillary refill takes less than 2 seconds.  Psychiatric: She has a normal mood and affect.  Nursing note and vitals reviewed.    ED Treatments / Results  Labs (all labs ordered are listed, but only abnormal results are displayed) Labs Reviewed  CBC WITH DIFFERENTIAL/PLATELET - Abnormal; Notable for the following components:      Result Value   Hemoglobin 10.7 (*)    HCT 35.1 (*)    MCH 24.6 (*)    RDW 16.0 (*)     Platelets 510 (*)    All other components within normal limits  COMPREHENSIVE METABOLIC PANEL - Abnormal; Notable for the following components:   Chloride 99 (*)    Glucose, Bld 100 (*)    Creatinine, Ser 1.04 (*)    Total Protein 8.3 (*)    All other components within normal limits  I-STAT TROPONIN, ED    EKG  EKG Interpretation None      DG Chest 2 View  Final Result      Radiology No results found.  Procedures Procedures (including critical care time)  Medications Ordered in ED Medications  oxyCODONE-acetaminophen (PERCOCET/ROXICET) 5-325 MG per tablet 2 tablet (not administered)     Initial Impression / Assessment and Plan / ED Course  I have reviewed the triage vital signs and the nursing notes.  Pertinent labs & imaging results that were available during my care of the patient were reviewed by me and considered in my medical decision making (see chart for details).  Clinical Course as of Dec 17 1641  Sat Dec 15, 2017  1630 It is possible that she has had progression of PEs in the setting of not having her anticoagulation for 4 doses.  Although she is talking in full sentences without any difficulty not tachypneic not tachycardic not hypoxic.  She is also asking for her pain medicine that she has been taking at home her oxycodone 2 tablets.  I reviewed her inpatient notes and I am still not sure why she is on only Lovenox.  She states she also has not taken any of her diabetes meds.  We will draw some screening labs chest x-ray EKG.  [MB]  0093 Patient's lab work looks at baseline or better than prior and she appears in no distress.  I do not feel she needs further CT imaging as the treatment would ultimately be the same have ordered her 120 mg of her Lovenox dose today and she will restart her medicine tomorrow but I offered her that she can come in the morning to get a second dose before she is not sure she would be able to get her medicines until the afternoon.  [MB]     Clinical Course User Index [MB] Hayden Rasmussen, MD     Final Clinical Impressions(s) / ED Diagnoses   Final diagnoses:  Chest pain, unspecified type    ED Discharge Orders    None  Hayden Rasmussen, MD 12/17/17 [phone]

## 2017-12-18 ENCOUNTER — Emergency Department (HOSPITAL_COMMUNITY)
Admission: EM | Admit: 2017-12-18 | Discharge: 2017-12-19 | Disposition: A | Discharge status: Home / Self Care | Payer: Medicaid Other | Attending: Emergency Medicine | Admitting: Emergency Medicine

## 2017-12-18 ENCOUNTER — Emergency Department (HOSPITAL_COMMUNITY): Payer: Medicaid Other

## 2017-12-18 ENCOUNTER — Other Ambulatory Visit (HOSPITAL_COMMUNITY): Payer: Self-pay

## 2017-12-18 ENCOUNTER — Encounter (HOSPITAL_COMMUNITY): Payer: Self-pay | Admitting: Emergency Medicine

## 2017-12-18 DIAGNOSIS — Z86711 Personal history of pulmonary embolism: Secondary | ICD-10-CM | POA: Insufficient documentation

## 2017-12-18 DIAGNOSIS — Z7984 Long term (current) use of oral hypoglycemic drugs: Secondary | ICD-10-CM | POA: Insufficient documentation

## 2017-12-18 DIAGNOSIS — Z87891 Personal history of nicotine dependence: Secondary | ICD-10-CM | POA: Insufficient documentation

## 2017-12-18 DIAGNOSIS — Z79899 Other long term (current) drug therapy: Secondary | ICD-10-CM | POA: Diagnosis not present

## 2017-12-18 DIAGNOSIS — R109 Unspecified abdominal pain: Secondary | ICD-10-CM

## 2017-12-18 DIAGNOSIS — E119 Type 2 diabetes mellitus without complications: Secondary | ICD-10-CM | POA: Diagnosis not present

## 2017-12-18 DIAGNOSIS — G8918 Other acute postprocedural pain: Secondary | ICD-10-CM | POA: Diagnosis not present

## 2017-12-18 DIAGNOSIS — R1031 Right lower quadrant pain: Secondary | ICD-10-CM | POA: Insufficient documentation

## 2017-12-18 LAB — URINALYSIS, ROUTINE W REFLEX MICROSCOPIC
BILIRUBIN URINE: NEGATIVE
Glucose, UA: NEGATIVE mg/dL
KETONES UR: NEGATIVE mg/dL
Nitrite: NEGATIVE
PROTEIN: NEGATIVE mg/dL
SPECIFIC GRAVITY, URINE: 1.015 (ref 1.005–1.030)
pH: 5 (ref 5.0–8.0)

## 2017-12-18 LAB — CBC
HEMATOCRIT: 33.8 % — AB (ref 36.0–46.0)
Hemoglobin: 10.2 g/dL — ABNORMAL LOW (ref 12.0–15.0)
MCH: 24.6 pg — ABNORMAL LOW (ref 26.0–34.0)
MCHC: 30.2 g/dL (ref 30.0–36.0)
MCV: 81.4 fL (ref 78.0–100.0)
Platelets: 366 10*3/uL (ref 150–400)
RBC: 4.15 MIL/uL (ref 3.87–5.11)
RDW: 16.6 % — AB (ref 11.5–15.5)
WBC: 5.2 10*3/uL (ref 4.0–10.5)

## 2017-12-18 LAB — COMPREHENSIVE METABOLIC PANEL
ALK PHOS: 77 U/L (ref 38–126)
ALT: 31 U/L (ref 14–54)
AST: 25 U/L (ref 15–41)
Albumin: 3.8 g/dL (ref 3.5–5.0)
Anion gap: 11 (ref 5–15)
BUN: 13 mg/dL (ref 6–20)
CHLORIDE: 103 mmol/L (ref 101–111)
CO2: 25 mmol/L (ref 22–32)
Calcium: 9.2 mg/dL (ref 8.9–10.3)
Creatinine, Ser: 1 mg/dL (ref 0.44–1.00)
GFR calc Af Amer: >60 mL/min (ref >60)
GLUCOSE: 114 mg/dL — AB (ref 65–99)
POTASSIUM: 3.8 mmol/L (ref 3.5–5.1)
SODIUM: 139 mmol/L (ref 135–145)
Total Bilirubin: 0.4 mg/dL (ref 0.3–1.2)
Total Protein: 7.6 g/dL (ref 6.5–8.1)

## 2017-12-18 LAB — I-STAT BETA HCG BLOOD, ED (MC, WL, AP ONLY)

## 2017-12-18 LAB — LIPASE, BLOOD: Lipase: 32 U/L (ref 11–51)

## 2017-12-18 MED ORDER — DIPHENHYDRAMINE HCL 50 MG/ML IJ SOLN
50.0000 mg | Freq: Once | INTRAMUSCULAR | Status: AC
  Administered 2017-12-18: 50 mg via INTRAVENOUS
  Filled 2017-12-18: qty 1

## 2017-12-18 MED ORDER — DICYCLOMINE HCL 10 MG/ML IM SOLN
20.0000 mg | Freq: Once | INTRAMUSCULAR | Status: AC
  Administered 2017-12-18: 20 mg via INTRAMUSCULAR
  Filled 2017-12-18: qty 2

## 2017-12-18 MED ORDER — HALOPERIDOL LACTATE 5 MG/ML IJ SOLN
3.0000 mg | Freq: Once | INTRAMUSCULAR | Status: AC
  Administered 2017-12-18: 3 mg via INTRAVENOUS
  Filled 2017-12-18: qty 1

## 2017-12-18 MED ORDER — IOPAMIDOL (ISOVUE-370) INJECTION 76%
INTRAVENOUS | Status: AC
  Administered 2017-12-18: 100 mL via INTRAVENOUS
  Filled 2017-12-18: qty 100

## 2017-12-18 MED ORDER — OXYCODONE HCL 5 MG PO TABS
5.0000 mg | ORAL_TABLET | Freq: Once | ORAL | Status: AC
  Administered 2017-12-18: 5 mg via ORAL
  Filled 2017-12-18: qty 1

## 2017-12-18 MED ORDER — SODIUM CHLORIDE 0.9 % IJ SOLN
INTRAMUSCULAR | Status: AC
  Filled 2017-12-18: qty 50

## 2017-12-18 MED ORDER — ONDANSETRON 4 MG PO TBDP
4.0000 mg | ORAL_TABLET | Freq: Once | ORAL | Status: AC
  Administered 2017-12-18: 4 mg via ORAL
  Filled 2017-12-18: qty 1

## 2017-12-18 MED ORDER — MORPHINE SULFATE (PF) 4 MG/ML IV SOLN
4.0000 mg | Freq: Once | INTRAVENOUS | Status: AC
  Administered 2017-12-18: 4 mg via INTRAVENOUS
  Filled 2017-12-18: qty 1

## 2017-12-18 MED ORDER — DIPHENHYDRAMINE HCL 25 MG PO CAPS: 25.0000 mg | ORAL_CAPSULE | Freq: Once | ORAL | Status: AC

## 2017-12-18 MED ORDER — HYDROCORTISONE NA SUCCINATE PF 250 MG IJ SOLR
200.0000 mg | Freq: Once | INTRAMUSCULAR | Status: AC
Start: 2017-12-18 — End: 2017-12-18
  Administered 2017-12-18: 200 mg via INTRAVENOUS
  Filled 2017-12-18: qty 200

## 2017-12-18 NOTE — ED Triage Notes (Signed)
Patient is taking her blood thinners again she reports.

## 2017-12-18 NOTE — ED Provider Notes (Signed)
Received care of patient at 4 PM from Dr. Ralene Bathe.  Please see her note for prior history, physical and care.  Briefly this is a 32 year old female with a history of C-section on January 29, and diagnosis of PE on February 8.  She had missed several doses of Lovenox over the weekend, and is presenting with abdominal pain mild shortness of breath.  Plan is to obtain CT PE study.  Patient has a contrast allergy, and Dr. Ralene Bathe had discussed care with her in detail, however patient desired PE study, will pretreat with Benadryl.  Patient had previously had pretreatment with Benadryl and hydrocortisone, was given a dose of steroid, and waited for the contrast allergy protocol.  During this time, patient reported continued abdominal pain.  I gave her Bentyl and Haldol for her abdominal pain. Dr. Ralene Bathe did not have concern for endometritis, or pelvic inflammatory disease on her pelvic exam and pt is afebrile.  Given her history of C-section and persistent pain, I also added on a CT abdomen pelvis.  CT PE study and CT abdomen pelvis showed no evidence of new PE, persistence of prior noted pulmonary embolus, and showed no sign of acute intraabdominal pathology.  Discussed with patient recommendation to continue her Lovenox as prescribed, and to follow-up with her obstetric physician and primary care physician regarding her persistent pain following her C-section, discussed that this may be postsurgical pain and related to scar tissue.  Recommend follow up. Pt improved and stable for discharge.   Gareth Morgan, MD 12/20/17 [phone]

## 2017-12-18 NOTE — ED Provider Notes (Signed)
[facility] DEPT Provider Note   CSN: [id number] Arrival date & time: 12/18/17  1017     History   Chief Complaint Chief Complaint  Patient presents with  . Flank Pain    right  . Abdominal Pain    HPI Jodi Mcgee is a 32 y.o. female.  The history is provided by the patient. No language interpreter was used.  Flank Pain  Associated symptoms include abdominal pain.  Abdominal Pain      Jodi Mcgee is a 32 y.o. female who presents to the Emergency Department complaining of CP/AP/vaginal bleeding.  She delivered her child on Jan 29th via csection for preeclampsia.  She was diagnosed with PE on Feb 8th.  She is currently on lovenox BID.  She was unable to access her meds from Thursday until yesterday.  She resumed lovenox Monday night.  She developed chest pain since Thursday followed by right sided abdominal pain last night.  Pain is sharp and stabbing as well as constant pressure pain.  Pain is better with sitting up, worse with laying down.  She is taking oxycodone with no change in pain.  She is not breast feeding.  Denies fevers, cough, diarrhea.  She has mild sob, nausea.  She reports heavy vaginal bleeding, passing large clots (dark brownish) with no bleeding in between clots.  Bleeding has been present for the last 3-4 days, clots are long and skinny.  Passes 1-2 clots per day.  No current birth control.  No prior similar sxs.    Past Medical History:  Diagnosis Date  . Anemia   . Anxiety   . Blood transfusion without reported diagnosis    both CS  . Diabetes mellitus without complication ([place])   . GERD (gastroesophageal reflux disease)    has resolved  . IBS (irritable bowel syndrome)   . Ovarian cyst   . Peritonitis, acute generalized ([place])   . Sepsis [facility])     Patient Active Problem List   Diagnosis Date Noted  . Pulmonary embolism during puerperium 12/04/2017  . Pulmonary edema 11/30/2017  . S/P cesarean section  11/21/2017  . Back pain affecting pregnancy in third trimester 11/16/2017  . Non-compliant pregnant patient, third trimester 11/05/2017  . Pelvic adhesive disease 10/22/2017  . LGA (large for gestational age) fetus affecting management of mother 10/22/2017  . Anemia affecting pregnancy in third trimester 10/11/2017  . Obesity (BMI 30-39.9) 09/25/2017  . Polyhydramnios affecting pregnancy in third trimester 09/25/2017  . Hypertension in pregnancy, preeclampsia, severe, delivered/postpartum 09/13/2017  . Previous cesarean section complicating pregnancy 50/27/7412  . Supervision of high risk pregnancy, antepartum, third trimester 08/23/2017  . Insufficient prenatal care 08/23/2017  . Type 2 diabetes mellitus complicating pregnancy, antepartum 08/23/2017  . Chronic pain syndrome 08/23/2017    Past Surgical History:  Procedure Laterality Date  . APPENDECTOMY     ruptured   . CESAREAN SECTION    . CESAREAN SECTION Bilateral 11/20/2017   Procedure: CESAREAN SECTION;  Surgeon: Sloan Leiter, MD;  Location: [facility];  Service: Obstetrics;  Laterality: Bilateral;  . OVARIAN CYST DRAINAGE    . SMALL BOWEL REPAIR      OB History    Gravida Para Term Preterm AB Living   3 3 1 2  0 3   SAB TAB Ectopic Multiple Live Births   0 0 0 0 3       Home Medications    Prior to Admission medications   Medication  Sig Start Date End Date Taking? Authorizing Provider  amLODipine (NORVASC) 10 MG tablet Take 1 tablet (10 mg total) by mouth daily. 12/02/17  Yes Florian Buff, MD  cyclobenzaprine (FLEXERIL) 10 MG tablet Take 1 tablet (10 mg total) by mouth 3 (three) times daily as needed for muscle spasms. Patient taking differently: Take 10 mg by mouth 3 (three) times daily as needed for muscle spasms.  11/12/17  Yes Anyanwu, Sallyanne Havers, MD  enoxaparin (LOVENOX) 120 MG/0.8ML injection Inject 0.8 mLs (120 mg total) into the skin every 12 (twelve) hours. 12/02/17  Yes Florian Buff, MD  glyBURIDE  (DIABETA) 2.5 MG tablet Take 2.5 mg by mouth daily with breakfast.   Yes [provider]  metFORMIN (GLUCOPHAGE) 1000 MG tablet Take 1 tablet (1,000 mg total) by mouth 2 (two) times daily with a meal. 08/23/17  Yes Constant, Peggy, MD  oxyCODONE (OXY IR/ROXICODONE) 5 MG immediate release tablet Take 1 tablet (5 mg total) by mouth every 4 (four) hours as needed for severe pain. 12/02/17  Yes Florian Buff, MD  triamcinolone (KENALOG) 0.025 % ointment Apply 1 application topically daily as needed (eczema).   Yes [provider]  docusate sodium (COLACE) 100 MG capsule Take 1 capsule (100 mg total) by mouth 2 (two) times daily as needed. Patient not taking: Reported on 12/15/2017 11/23/17   Constant, Peggy, MD  gentamicin (GARAMYCIN) 0.3 % ophthalmic solution Place 2 drops into the left eye 3 (three) times daily. Patient not taking: Reported on 11/30/2017 11/08/17   Chancy Milroy, MD  Iron-FA-B Cmp-C-Biot-Probiotic (FUSION PLUS) CAPS Take 1 tablet by mouth daily. Patient not taking: Reported on 11/30/2017 11/05/17   Aletha Halim, MD  pantoprazole (PROTONIX) 20 MG tablet Take 1 tablet (20 mg total) by mouth 2 (two) times daily. Patient not taking: Reported on 12/01/2017 11/02/17   Wende Mott, CNM  triamterene-hydrochlorothiazide (MAXZIDE-25) 37.5-25 MG tablet Take 2 tablets by mouth daily. Patient not taking: Reported on 12/18/2017 12/02/17   Florian Buff, MD    Family History Family History  Problem Relation Age of Onset  . Hypertension Mother   . Anemia Mother   . Diabetes Father     Social History Social History   Tobacco Use  . Smoking status: Former Smoker    Packs/day: 0.50    Types: Cigarettes  . Smokeless tobacco: Never Used  Substance Use Topics  . Alcohol use: No  . Drug use: No     Allergies   Cetirizine & related; Toradol [ketorolac tromethamine]; Contrast media [iodinated diagnostic agents]; and Nsaids   Review of Systems Review of Systems    Gastrointestinal: Positive for abdominal pain.  Genitourinary: Positive for flank pain.  All other systems reviewed and are negative.    Physical Exam Updated Vital Signs BP 112/75 (BP Location: Left Arm)   Pulse 90   Temp 98.9 F (37.2 C) (Oral)   Resp 16   Ht 5\' 6"  (1.676 m)   Wt 103 kg (227 lb 1 oz)   SpO2 97%   BMI 36.65 kg/m   Physical Exam  Constitutional: She is oriented to person, place, and time. She appears well-developed and well-nourished.  HENT:  Head: Normocephalic and atraumatic.  Cardiovascular: Normal rate and regular rhythm.  No murmur heard. Pulmonary/Chest: Effort normal and breath sounds normal. No respiratory distress.  Abdominal: Soft. There is no rebound and no guarding.  Mild to moderate right hemi-abdominal tenderness  Genitourinary:  Genitourinary Comments: Moderate amount  of thin brown discharge.  No clots or active bleeding.  No CMT.  Musculoskeletal: She exhibits no edema or tenderness.  Neurological: She is alert and oriented to person, place, and time.  Skin: Skin is warm and dry.  Psychiatric: She has a normal mood and affect. Her behavior is normal.  Nursing note and vitals reviewed.    ED Treatments / Results  Labs (all labs ordered are listed, but only abnormal results are displayed) Labs Reviewed  COMPREHENSIVE METABOLIC PANEL - Abnormal; Notable for the following components:      Result Value   Glucose, Bld 114 (*)    All other components within normal limits  CBC - Abnormal; Notable for the following components:   Hemoglobin 10.2 (*)    HCT 33.8 (*)    MCH 24.6 (*)    RDW 16.6 (*)    All other components within normal limits  URINALYSIS, ROUTINE W REFLEX MICROSCOPIC - Abnormal; Notable for the following components:   APPearance CLOUDY (*)    Hgb urine dipstick MODERATE (*)    Leukocytes, UA SMALL (*)    Bacteria, UA FEW (*)    Squamous Epithelial / LPF 6-30 (*)    All other components within normal limits  LIPASE,  BLOOD  I-STAT BETA HCG BLOOD, ED ([facility], [facility], [facility] ONLY)  GC/CHLAMYDIA PROBE AMP ([place]) NOT AT [facility]  GC/CHLAMYDIA PROBE AMP ([place]) NOT AT [facility]    EKG  EKG Interpretation None       Radiology US Abdomen Complete  Result Date: 12/18/2017 CLINICAL DATA:  Right-sided abdominal pain. EXAM: ABDOMEN ULTRASOUND COMPLETE COMPARISON:  Abdominal ultrasound dated November 27, 2017. FINDINGS: Gallbladder: No gallstones or wall thickening visualized. No sonographic Murphy sign noted by sonographer. Common bile duct: Diameter: 3 mm, normal. Liver: No focal lesion identified. Slightly increased in parenchymal echogenicity. Portal vein is patent on color Doppler imaging with normal direction of blood flow towards the liver. IVC: No abnormality visualized. Pancreas: Visualized portion unremarkable. Spleen: Enlarged.  Estimated volume of 440 cc. Right Kidney: Length: 10.0 cm. Echogenicity within normal limits. No mass or hydronephrosis visualized. Left Kidney: Length: 11.3 cm. Echogenicity within normal limits. No mass or hydronephrosis visualized. Abdominal aorta: No aneurysm visualized. Other findings: None. IMPRESSION: 1. No acute abnormality. 2. Mild hepatic steatosis. 3. Unchanged mild splenomegaly. Electronically Signed   By: Titus Dubin M.D.   On: 12/18/2017 15:48    Procedures Procedures (including critical care time)  Medications Ordered in ED Medications  morphine 4 MG/ML injection 4 mg (not administered)  diphenhydrAMINE (BENADRYL) injection 50 mg (not administered)  oxyCODONE (Oxy IR/ROXICODONE) immediate release tablet 5 mg (5 mg Oral Given 12/18/17 1303)  ondansetron (ZOFRAN-ODT) disintegrating tablet 4 mg (4 mg Oral Given 12/18/17 1303)  oxyCODONE (Oxy IR/ROXICODONE) immediate release tablet 5 mg (5 mg Oral Given 12/18/17 1438)     Initial Impression / Assessment and Plan / ED Course  I have reviewed the triage vital signs and the nursing notes.  Pertinent labs & imaging  results that were available during my care of the patient were reviewed by me and considered in my medical decision making (see chart for details).     Pt one month pospartum following csection, complicated with PE currently on lovenox.  She did miss a few doses of lovenox but did resume it yesterday evening.  She is no acute distress in the ED.  In terms of abdominal pain/vaginal bleeding - no evidence of hemorrhage on exam, abdominal exam  benign.  Presentation is not c/w cholecystitis, appendicitis, postoperative complication.  In terms of her right sided chest pain - patient wants repeat CT to eval for recurrent PE.  She does have a hx/o contrast allergy and discussed risk of worsening reaction and anaphylaxis and she acknowledges risk.  Patient care transferred pending CTA.    Final Clinical Impressions(s) / ED Diagnoses   Final diagnoses:  Right sided abdominal pain    ED Discharge Orders    None       Quintella Reichert, MD 12/18/17 [phone]

## 2017-12-18 NOTE — ED Notes (Signed)
Urine culture also sent with a "save tube" label.

## 2017-12-18 NOTE — ED Triage Notes (Signed)
C/o right flank pain for several days. Reports when has menstrual has dark blood clots. Vitals: 115/70

## 2017-12-18 NOTE — ED Notes (Signed)
Provided patient ice chips with permission from provider.

## 2017-12-19 LAB — GC/CHLAMYDIA PROBE AMP (~~LOC~~) NOT AT ARMC
CHLAMYDIA, DNA PROBE: NEGATIVE
Neisseria Gonorrhea: NEGATIVE

## 2018-01-03 ENCOUNTER — Other Ambulatory Visit (HOSPITAL_COMMUNITY)
Admission: RE | Admit: 2018-01-03 | Discharge: 2018-01-03 | Disposition: A | Discharge status: Home / Self Care | Payer: Medicaid Other | Source: Ambulatory Visit | Attending: Obstetrics and Gynecology | Admitting: Obstetrics and Gynecology

## 2018-01-03 ENCOUNTER — Ambulatory Visit (INDEPENDENT_AMBULATORY_CARE_PROVIDER_SITE_OTHER): Payer: Medicaid Other | Admitting: Obstetrics and Gynecology

## 2018-01-03 ENCOUNTER — Encounter: Payer: Self-pay | Admitting: Obstetrics and Gynecology

## 2018-01-03 VITALS — BP 166/118 | HR 84 | Ht 66.0 in | Wt 237.0 lb

## 2018-01-03 DIAGNOSIS — Z98891 History of uterine scar from previous surgery: Secondary | ICD-10-CM | POA: Insufficient documentation

## 2018-01-03 DIAGNOSIS — I10 Essential (primary) hypertension: Secondary | ICD-10-CM | POA: Insufficient documentation

## 2018-01-03 DIAGNOSIS — O8823 Thromboembolism in the puerperium: Secondary | ICD-10-CM

## 2018-01-03 DIAGNOSIS — Z3202 Encounter for pregnancy test, result negative: Secondary | ICD-10-CM | POA: Diagnosis not present

## 2018-01-03 DIAGNOSIS — E118 Type 2 diabetes mellitus with unspecified complications: Secondary | ICD-10-CM

## 2018-01-03 DIAGNOSIS — Z1389 Encounter for screening for other disorder: Secondary | ICD-10-CM

## 2018-01-03 DIAGNOSIS — Z30430 Encounter for insertion of intrauterine contraceptive device: Secondary | ICD-10-CM | POA: Diagnosis not present

## 2018-01-03 DIAGNOSIS — Z794 Long term (current) use of insulin: Secondary | ICD-10-CM

## 2018-01-03 DIAGNOSIS — Z975 Presence of (intrauterine) contraceptive device: Secondary | ICD-10-CM | POA: Insufficient documentation

## 2018-01-03 LAB — POCT PREGNANCY, URINE: PREG TEST UR: NEGATIVE

## 2018-01-03 MED ORDER — ENOXAPARIN SODIUM 120 MG/0.8ML ~~LOC~~ SOLN: 1.0000 mg/kg | Freq: Two times a day (BID) | SUBCUTANEOUS | 1 refills | Status: DC

## 2018-01-03 MED ORDER — TRAMADOL HCL 50 MG PO TABS: 50.0000 mg | ORAL_TABLET | Freq: Four times a day (QID) | ORAL | 0 refills | Status: DC | PRN

## 2018-01-03 MED ORDER — ENALAPRIL MALEATE 5 MG PO TABS: 5.0000 mg | ORAL_TABLET | Freq: Every day | ORAL | 1 refills | Status: DC

## 2018-01-03 MED ORDER — AMLODIPINE BESYLATE 10 MG PO TABS: 10.0000 mg | ORAL_TABLET | Freq: Every day | ORAL | 1 refills | Status: DC

## 2018-01-03 MED ORDER — LEVONORGESTREL 19.5 MCG/DAY IU IUD
INTRAUTERINE_SYSTEM | Freq: Once | INTRAUTERINE | Status: AC
  Administered 2018-01-03: 1 via INTRAUTERINE

## 2018-01-03 NOTE — Progress Notes (Signed)
Subjective:     Jodi Mcgee is a 32 y.o. female who presents for a postpartum visit. She is 6 weeks postpartum following a low cervical vertical Cesarean section. I have fully reviewed the prenatal and intrapartum course. The delivery was at 36.3 gestational weeks. Outcome: repeat cesarean section, classical incision. Anesthesia: spinal. Postpartum course has been complicated, as noted below. . Baby's course has been complicated by cardia issues, followed by [facility]. Randel Books is feeding by bottle - [employer]. Bleeding: patient reports passing clots daily but no bleeding otherwise. Bowel function is normal. Bladder function is normal. Patient is not sexually active. Contraception method is none. Patient is interested in the IUD. Postpartum depression screening: negative.  Postpartum course was complicated by PE. Currently on Lovenox. Pt continues to have some upper back pain and CP. Has been had follow up CT scan which demonstrated no progression of PE.Marland Kitchen   The following portions of the patient's history were reviewed and updated as appropriate: allergies, current medications, past family history, past medical history, past social history and past surgical history.  Review of Systems Pertinent items are noted in HPI.   Objective:    There were no vitals taken for this visit.  General:  alert   Breasts:  not examined  Lungs: clear to auscultation bilaterally  Heart:  regular rate and rhythm, S1, S2 normal, no murmur, click, rub or gallop  Abdomen: soft, non-tender; bowel sounds normal; no masses,  no organomegaly   Vulva:  normal  Vagina: normal vagina  Cervix:  no lesions  Corpus: normal  Adnexa:  normal adnexa  Rectal Exam: Not performed.       Procedure Note:  UPT negative Informed consent obtained Liletta IUD reviewed with pt. Pt placed in DL position. Peterson speculum placed. Cervix exposed. Pap smear obtained. Cervix cleaned with betadine x 3. Ant lip grasped with tenaculum.  Uterus sounded to 10 cm. Liletta load and inserted as per manufactory's orders. IUD string cut 3 cm from os. Instruments removed. Pt tolerated procedure well.         Assessment:     Nl postpartum exam. Pap smear at today's visit.     CHTN   DM   PE  IUD contraception  Plan:    1. Contraception: IUD. Liletta placed today 2. Will continue with Metformin, Norvasc and Lovenox. Add Vasotec. Refer to [facility] for continued management of her CHTN, DM and PE.  3. Follow up in: 1 yr or as needed.

## 2018-01-03 NOTE — Patient Instructions (Signed)
Health Maintenance, Female Adopting a healthy lifestyle and getting preventive care can go a long way to promote health and wellness. Talk with your health care provider about what schedule of regular examinations is right for you. This is a good chance for you to check in with your provider about disease prevention and staying healthy. In between checkups, there are plenty of things you can do on your own. Experts have done a lot of research about which lifestyle changes and preventive measures are most likely to keep you healthy. Ask your health care provider for more information. Weight and diet Eat a healthy diet  Be sure to include plenty of vegetables, fruits, low-fat dairy products, and lean protein.  Do not eat a lot of foods high in solid fats, added sugars, or salt.  Get regular exercise. This is one of the most important things you can do for your health. ? Most adults should exercise for at least 150 minutes each week. The exercise should increase your heart rate and make you sweat (moderate-intensity exercise). ? Most adults should also do strengthening exercises at least twice a week. This is in addition to the moderate-intensity exercise.  Maintain a healthy weight  Body mass index (BMI) is a measurement that can be used to identify possible weight problems. It estimates body fat based on height and weight. Your health care provider can help determine your BMI and help you achieve or maintain a healthy weight.  For females 20 years of age and older: ? A BMI below 18.5 is considered underweight. ? A BMI of 18.5 to 24.9 is normal. ? A BMI of 25 to 29.9 is considered overweight. ? A BMI of 30 and above is considered obese.  Watch levels of cholesterol and blood lipids  You should start having your blood tested for lipids and cholesterol at 33 years of age, then have this test every 5 years.  You may need to have your cholesterol levels checked more often if: ? Your lipid or  cholesterol levels are high. ? You are older than 33 years of age. ? You are at high risk for heart disease.  Cancer screening Lung Cancer  Lung cancer screening is recommended for adults 55-80 years old who are at high risk for lung cancer because of a history of smoking.  A yearly low-dose CT scan of the lungs is recommended for people who: ? Currently smoke. ? Have quit within the past 15 years. ? Have at least a 30-pack-year history of smoking. A pack year is smoking an average of one pack of cigarettes a day for 1 year.  Yearly screening should continue until it has been 15 years since you quit.  Yearly screening should stop if you develop a health problem that would prevent you from having lung cancer treatment.  Breast Cancer  Practice breast self-awareness. This means understanding how your breasts normally appear and feel.  It also means doing regular breast self-exams. Let your health care provider know about any changes, no matter how small.  If you are in your 20s or 30s, you should have a clinical breast exam (CBE) by a health care provider every 1-3 years as part of a regular health exam.  If you are 40 or older, have a CBE every year. Also consider having a breast X-ray (mammogram) every year.  If you have a family history of breast cancer, talk to your health care provider about genetic screening.  If you are at high risk   for breast cancer, talk to your health care provider about having an MRI and a mammogram every year.  Breast cancer gene (BRCA) assessment is recommended for women who have family members with BRCA-related cancers. BRCA-related cancers include: ? Breast. ? Ovarian. ? Tubal. ? Peritoneal cancers.  Results of the assessment will determine the need for genetic counseling and BRCA1 and BRCA2 testing.  Cervical Cancer Your health care provider may recommend that you be screened regularly for cancer of the pelvic organs (ovaries, uterus, and  vagina). This screening involves a pelvic examination, including checking for microscopic changes to the surface of your cervix (Pap test). You may be encouraged to have this screening done every 3 years, beginning at age 22.  For women ages 56-65, health care providers may recommend pelvic exams and Pap testing every 3 years, or they may recommend the Pap and pelvic exam, combined with testing for human papilloma virus (HPV), every 5 years. Some types of HPV increase your risk of cervical cancer. Testing for HPV may also be done on women of any age with unclear Pap test results.  Other health care providers may not recommend any screening for nonpregnant women who are considered low risk for pelvic cancer and who do not have symptoms. Ask your health care provider if a screening pelvic exam is right for you.  If you have had past treatment for cervical cancer or a condition that could lead to cancer, you need Pap tests and screening for cancer for at least 20 years after your treatment. If Pap tests have been discontinued, your risk factors (such as having a new sexual partner) need to be reassessed to determine if screening should resume. Some women have medical problems that increase the chance of getting cervical cancer. In these cases, your health care provider may recommend more frequent screening and Pap tests.  Colorectal Cancer  This type of cancer can be detected and often prevented.  Routine colorectal cancer screening usually begins at 33 years of age and continues through 33 years of age.  Your health care provider may recommend screening at an earlier age if you have risk factors for colon cancer.  Your health care provider may also recommend using home test kits to check for hidden blood in the stool.  A small camera at the end of a tube can be used to examine your colon directly (sigmoidoscopy or colonoscopy). This is done to check for the earliest forms of colorectal  cancer.  Routine screening usually begins at age 33.  Direct examination of the colon should be repeated every 5-10 years through 33 years of age. However, you may need to be screened more often if early forms of precancerous polyps or small growths are found.  Skin Cancer  Check your skin from head to toe regularly.  Tell your health care provider about any new moles or changes in moles, especially if there is a change in a mole's shape or color.  Also tell your health care provider if you have a mole that is larger than the size of a pencil eraser.  Always use sunscreen. Apply sunscreen liberally and repeatedly throughout the day.  Protect yourself by wearing long sleeves, pants, a wide-brimmed hat, and sunglasses whenever you are outside.  Heart disease, diabetes, and high blood pressure  High blood pressure causes heart disease and increases the risk of stroke. High blood pressure is more likely to develop in: ? People who have blood pressure in the high end of  the normal range (130-139/85-89 mm Hg). ? People who are overweight or obese. ? People who are African American.  If you are 21-29 years of age, have your blood pressure checked every 3-5 years. If you are 3 years of age or older, have your blood pressure checked every year. You should have your blood pressure measured twice-once when you are at a hospital or clinic, and once when you are not at a hospital or clinic. Record the average of the two measurements. To check your blood pressure when you are not at a hospital or clinic, you can use: ? An automated blood pressure machine at a pharmacy. ? A home blood pressure monitor.  If you are between 17 years and 37 years old, ask your health care provider if you should take aspirin to prevent strokes.  Have regular diabetes screenings. This involves taking a blood sample to check your fasting blood sugar level. ? If you are at a normal weight and have a low risk for diabetes,  have this test once every three years after 33 years of age. ? If you are overweight and have a high risk for diabetes, consider being tested at a younger age or more often. Preventing infection Hepatitis B  If you have a higher risk for hepatitis B, you should be screened for this virus. You are considered at high risk for hepatitis B if: ? You were born in a country where hepatitis B is common. Ask your health care provider which countries are considered high risk. ? Your parents were born in a high-risk country, and you have not been immunized against hepatitis B (hepatitis B vaccine). ? You have HIV or AIDS. ? You use needles to inject street drugs. ? You live with someone who has hepatitis B. ? You have had sex with someone who has hepatitis B. ? You get hemodialysis treatment. ? You take certain medicines for conditions, including cancer, organ transplantation, and autoimmune conditions.  Hepatitis C  Blood testing is recommended for: ? Everyone born from [birth year] through 1966. ? Anyone with known risk factors for hepatitis C.  Sexually transmitted infections (STIs)  You should be screened for sexually transmitted infections (STIs) including gonorrhea and chlamydia if: ? You are sexually active and are younger than 33 years of age. ? You are older than 33 years of age and your health care provider tells you that you are at risk for this type of infection. ? Your sexual activity has changed since you were last screened and you are at an increased risk for chlamydia or gonorrhea. Ask your health care provider if you are at risk.  If you do not have HIV, but are at risk, it may be recommended that you take a prescription medicine daily to prevent HIV infection. This is called pre-exposure prophylaxis (PrEP). You are considered at risk if: ? You are sexually active and do not regularly use condoms or know the HIV status of your partner(s). ? You take drugs by injection. ? You are  sexually active with a partner who has HIV.  Talk with your health care provider about whether you are at high risk of being infected with HIV. If you choose to begin PrEP, you should first be tested for HIV. You should then be tested every 3 months for as long as you are taking PrEP. Pregnancy  If you are premenopausal and you may become pregnant, ask your health care provider about preconception counseling.  If you may become  pregnant, take 400 to 800 micrograms (mcg) of folic acid every day.  If you want to prevent pregnancy, talk to your health care provider about birth control (contraception). Osteoporosis and menopause  Osteoporosis is a disease in which the bones lose minerals and strength with aging. This can result in serious bone fractures. Your risk for osteoporosis can be identified using a bone density scan.  If you are 66 years of age or older, or if you are at risk for osteoporosis and fractures, ask your health care provider if you should be screened.  Ask your health care provider whether you should take a calcium or vitamin D supplement to lower your risk for osteoporosis.  Menopause may have certain physical symptoms and risks.  Hormone replacement therapy may reduce some of these symptoms and risks. Talk to your health care provider about whether hormone replacement therapy is right for you. Follow these instructions at home:  Schedule regular health, dental, and eye exams.  Stay current with your immunizations.  Do not use any tobacco products including cigarettes, chewing tobacco, or electronic cigarettes.  If you are pregnant, do not drink alcohol.  If you are breastfeeding, limit how much and how often you drink alcohol.  Limit alcohol intake to no more than 1 drink per day for nonpregnant women. One drink equals 12 ounces of beer, 5 ounces of wine, or 1 ounces of hard liquor.  Do not use street drugs.  Do not share needles.  Ask your health care  provider for help if you need support or information about quitting drugs.  Tell your health care provider if you often feel depressed.  Tell your health care provider if you have ever been abused or do not feel safe at home. This information is not intended to replace advice given to you by your health care provider. Make sure you discuss any questions you have with your health care provider. Document Released: 04/24/2011 Document Revised: 03/16/2016 Document Reviewed: 07/13/2015 Elsevier Interactive Patient Education  2018 [employer]. IUD PLACEMENT POST-PROCEDURE INSTRUCTIONS  1. You may take Ibuprofen, Aleve or Tylenol for pain if needed.  Cramping should resolve within in 24 hours.  2. You may have a small amount of spotting.  You should wear a mini pad for the next few days.  3. You may have intercourse after 24 hours.  If you using this for birth control, it is effective immediately.  4. You need to call if you have any pelvic pain, fever, heavy bleeding or foul smelling vaginal discharge.  Irregular bleeding is common the first several months after having an IUD placed. You do not need to call for this reason unless you are concerned.  5. Shower or bathe as normal  6. You should have a follow-up appointment in 4-8 weeks for a re-check to make sure you are not having any problems.

## 2018-01-07 ENCOUNTER — Other Ambulatory Visit: Payer: Self-pay

## 2018-01-07 ENCOUNTER — Ambulatory Visit: Payer: Medicaid Other | Admitting: Student

## 2018-01-07 ENCOUNTER — Encounter: Payer: Self-pay | Admitting: Student

## 2018-01-07 VITALS — BP 138/88 | HR 79 | Temp 97.7°F | Ht 66.0 in | Wt 240.0 lb

## 2018-01-07 DIAGNOSIS — R0781 Pleurodynia: Secondary | ICD-10-CM

## 2018-01-07 LAB — CYTOLOGY - PAP
Diagnosis: NEGATIVE
HPV: NOT DETECTED

## 2018-01-07 NOTE — Progress Notes (Signed)
Subjective:    Jodi Mcgee is a 32 y.o. old female here to establish care.  She also like to discuss about chest pain.  HPI Chest pain: diagnosed with PE about 6 week ago, about 10 days postpartum.  CTA chest on 11/30/2017 with acute segmental pulmonary emboli in left lower lobe and right middle lobe without right heart strain. Started on Lovenox injection. Reports good compliance. Last dose was this morning.  Presented to ED on 12/15/2017 for dyspnea. No imaging done and discharged home.  Presented to ED on 12/18/2017 with abdominal pain.  CTA abdomen and chest obtained.  CTA chest read as no new PE but concern about some interval change in the left lower lobe segmental PE clot burden.  Today, she complains about right-sided chest pain for 2 weeks. She describes the pain as pleuritic. Chest pain is getting worse gradually over the last two weeks. Reports dry cough but saw a streak of blood three days ago. Reports shortness of breath. Denies fever, chills. Admits dizziness that she describes as lightheadedness and near passing out. Admits nausea but no emesis. Denies stressor at home although taking care of newborn. Not sure about family history. She is adopted.   PMH/Problem List: has Previous cesarean section complicating pregnancy; Type 2 diabetes mellitus, without long-term current use of insulin ([place]); Chronic pain syndrome; Hypertension in pregnancy, preeclampsia, severe, delivered/postpartum; Obesity (BMI 30-39.9); Pelvic adhesive disease; S/P cesarean section; Pulmonary edema; Pulmonary embolism during puerperium; History of cesarean section, classical; Chronic hypertension; IUD contraception; Postpartum care and examination; Nasal septal perforation; History of cocaine abuse; Epistaxis; and Benign neoplasm of nasal cavity on their problem list.   has a past medical history of Anemia, Anxiety, Blood transfusion without reported diagnosis, Diabetes mellitus without complication ([place]), GERD  (gastroesophageal reflux disease), IBS (irritable bowel syndrome), Ovarian cyst, Peritonitis, acute generalized ([place]), and Sepsis ([place]).  FH:  Family History  Problem Relation Age of Onset  . Hypertension Mother   . Anemia Mother   . Diabetes Father     SH Social History   Tobacco Use  . Smoking status: Current Some Day Smoker    Packs/day: 0.50    Types: Cigarettes  . Smokeless tobacco: Never Used  Substance Use Topics  . Alcohol use: No  . Drug use: No    Review of Systems Review of systems negative except for pertinent positives and negatives in history of present illness above.     Objective:     Vitals:   01/07/18 1109  BP: 138/88  Pulse: 79  Temp: 97.7 F (36.5 C)  TempSrc: Oral  SpO2: 96%  Weight: 240 lb (108.9 kg)  Height: 5\' 6"  (1.676 m)   Body mass index is 38.74 kg/m.  Physical Exam  GEN: appears well, no apparent distress. Head: normocephalic and atraumatic  Eyes: No conjunctival pallor.  Sclera anicteric Oropharynx: mmm without erythema, exudation or petechiae.  Uvula midline HEM: negative for cervical or periauricular lymphadenopathies CVS: RRR, nl s1 & s2, no murmurs, no edema,  2+ DP pulses bilaterally, no calf tenderness or swelling RESP: no IWOB, good air movement bilaterally, CTAB GI: BS present & normal, soft, NTND MSK: Tenderness to palpation over her right chest anteriorly (this is different from her pleuritic chest pain) ENDO: negative thyromegally NEURO: alert and oiented appropriately, no gross deficits  PSYCH: euthymic mood with congruent affect    Assessment and Plan:  1. Pleuritic chest pain: patient with recent diagnosis of PE on Lovenox presenting with gradually worsening pleuritic  chest pain over the last 2 weeks.  She also reports dyspnea, some hemoptysis and near syncope.  She reports good compliance with her Lovenox.  Last dose this morning.  Vital signs within normal limits.  Oxygen saturation 96% on RA.  Presentation  concerning for new PE or possible increasing clot burden.  She is here with her mother.  Recommended going to ED for further evaluation.  She agrees. Patient states that her mother can drive her to ED.  Return in about 1 month (around 02/07/2018) for F/u.  Mercy Riding, MD 01/07/18 Pager: [phone]  Discussed patient with Dr. Nori Riis who agrees with the above plan.

## 2018-01-07 NOTE — Patient Instructions (Signed)
It was great seeing you today! We have addressed the following issues today  Chest pain: I am concerned that you could have additional blood clot.  I strongly recommend going to ED right away for further evaluation.  If we did any lab work today, and the results require attention, either me or my nurse will get in touch with you. If everything is normal, you will get a letter in mail and a message via . If you don't hear from [place] in two weeks, please give [place] a call. Otherwise, we look forward to seeing you again at your next visit. If you have any questions or concerns before then, please call the clinic at [phone].  Please bring all your medications to every doctors visit  Sign up for My Chart to have easy access to your labs results, and communication with your Primary care physician.    Please check-out at the front desk before leaving the clinic.    Take Care,   Dr. Cyndia Skeeters

## 2018-01-08 ENCOUNTER — Telehealth: Payer: Self-pay | Admitting: *Deleted

## 2018-01-08 NOTE — Telephone Encounter (Signed)
[facility] pharmacist Amy called and left a message 01/03/18 12:35 stating she is calling about a prescription written by Dr.Ervin for lovenox- states rx states 120 per 0.8 injection and quantity 1- wants to clarify if that is correct amount and how many days patient will be taking.

## 2018-01-09 ENCOUNTER — Other Ambulatory Visit: Payer: Self-pay

## 2018-01-09 ENCOUNTER — Emergency Department (HOSPITAL_COMMUNITY): Payer: Medicaid Other

## 2018-01-09 ENCOUNTER — Encounter (HOSPITAL_COMMUNITY): Payer: Self-pay | Admitting: Emergency Medicine

## 2018-01-09 ENCOUNTER — Emergency Department (HOSPITAL_COMMUNITY)
Admission: EM | Admit: 2018-01-09 | Discharge: 2018-01-09 | Disposition: A | Discharge status: Home / Self Care | Payer: Medicaid Other | Attending: Emergency Medicine | Admitting: Emergency Medicine

## 2018-01-09 DIAGNOSIS — R079 Chest pain, unspecified: Secondary | ICD-10-CM | POA: Insufficient documentation

## 2018-01-09 DIAGNOSIS — Z7984 Long term (current) use of oral hypoglycemic drugs: Secondary | ICD-10-CM | POA: Diagnosis not present

## 2018-01-09 DIAGNOSIS — F17210 Nicotine dependence, cigarettes, uncomplicated: Secondary | ICD-10-CM | POA: Diagnosis not present

## 2018-01-09 DIAGNOSIS — Z7982 Long term (current) use of aspirin: Secondary | ICD-10-CM | POA: Insufficient documentation

## 2018-01-09 DIAGNOSIS — E119 Type 2 diabetes mellitus without complications: Secondary | ICD-10-CM | POA: Diagnosis not present

## 2018-01-09 DIAGNOSIS — Z79899 Other long term (current) drug therapy: Secondary | ICD-10-CM | POA: Diagnosis not present

## 2018-01-09 DIAGNOSIS — Z7901 Long term (current) use of anticoagulants: Secondary | ICD-10-CM | POA: Insufficient documentation

## 2018-01-09 LAB — CBC WITH DIFFERENTIAL/PLATELET
BASOS ABS: 0 10*3/uL (ref 0.0–0.1)
BASOS PCT: 0 %
Eosinophils Absolute: 0.1 10*3/uL (ref 0.0–0.7)
Eosinophils Relative: 2 %
HEMATOCRIT: 32 % — AB (ref 36.0–46.0)
HEMOGLOBIN: 9.8 g/dL — AB (ref 12.0–15.0)
LYMPHS PCT: 25 %
Lymphs Abs: 1.1 10*3/uL (ref 0.7–4.0)
MCH: 24.3 pg — ABNORMAL LOW (ref 26.0–34.0)
MCHC: 30.6 g/dL (ref 30.0–36.0)
MCV: 79.4 fL (ref 78.0–100.0)
MONO ABS: 0.2 10*3/uL (ref 0.1–1.0)
Monocytes Relative: 5 %
NEUTROS ABS: 2.8 10*3/uL (ref 1.7–7.7)
NEUTROS PCT: 68 %
Platelets: 286 10*3/uL (ref 150–400)
RBC: 4.03 MIL/uL (ref 3.87–5.11)
RDW: 17.6 % — ABNORMAL HIGH (ref 11.5–15.5)
WBC: 4.2 10*3/uL (ref 4.0–10.5)

## 2018-01-09 LAB — I-STAT TROPONIN, ED: TROPONIN I, POC: 0 ng/mL (ref 0.00–0.08)

## 2018-01-09 LAB — COMPREHENSIVE METABOLIC PANEL
ALBUMIN: 3.4 g/dL — AB (ref 3.5–5.0)
ALK PHOS: 81 U/L (ref 38–126)
ALT: 30 U/L (ref 14–54)
AST: 23 U/L (ref 15–41)
Anion gap: 7 (ref 5–15)
BILIRUBIN TOTAL: 0.6 mg/dL (ref 0.3–1.2)
BUN: 7 mg/dL (ref 6–20)
CO2: 25 mmol/L (ref 22–32)
CREATININE: 0.79 mg/dL (ref 0.44–1.00)
Calcium: 8.8 mg/dL — ABNORMAL LOW (ref 8.9–10.3)
Chloride: 105 mmol/L (ref 101–111)
GFR calc Af Amer: >60 mL/min (ref >60)
GLUCOSE: 91 mg/dL (ref 65–99)
POTASSIUM: 3.7 mmol/L (ref 3.5–5.1)
Sodium: 137 mmol/L (ref 135–145)
TOTAL PROTEIN: 7 g/dL (ref 6.5–8.1)

## 2018-01-09 LAB — D-DIMER, QUANTITATIVE (NOT AT ARMC): D DIMER QUANT: 0.35 ug{FEU}/mL (ref 0.00–0.50)

## 2018-01-09 MED ORDER — ACETAMINOPHEN ER 650 MG PO TBCR: 650.0000 mg | EXTENDED_RELEASE_TABLET | Freq: Three times a day (TID) | ORAL | 0 refills | Status: DC | PRN

## 2018-01-09 MED ORDER — ACETAMINOPHEN 325 MG PO TABS
650.0000 mg | ORAL_TABLET | Freq: Once | ORAL | Status: AC
Start: 2018-01-09 — End: 2018-01-09
  Administered 2018-01-09: 650 mg via ORAL
  Filled 2018-01-09: qty 2

## 2018-01-09 MED ORDER — BENZONATATE 100 MG PO CAPS: 100.0000 mg | ORAL_CAPSULE | Freq: Three times a day (TID) | ORAL | 0 refills | Status: DC | PRN

## 2018-01-09 MED ORDER — TRAMADOL HCL 50 MG PO TABS
100.0000 mg | ORAL_TABLET | Freq: Once | ORAL | Status: AC
  Administered 2018-01-09: 100 mg via ORAL
  Filled 2018-01-09: qty 2

## 2018-01-09 NOTE — Discharge Instructions (Addendum)
You were seen in the emergency department today for chest pain. Your lab work was all reassuring- no problems with your heart were identified. Your hemoglobin was 9.8 this is slightly lower from when last checked, but is consistent with previous lab work you have had done, this is something to continue monitoring with your primary care provider. Your chest x-ray did not show signs of pneumonia. Your ekg did not show signs of problems with your heart. We suspect the pain is caused by the clot in your lungs. Continue to take your lovenox as prescribed as well as the tramadol you are prescribed. We have given you a prescription for tylenol which you can take in addition to this every 8 hours as needed. We have given you a prescription for tessalon- this is a cough medicine you an take every 8 hours as needed- this is to help limit your chest wall irritation. Please discuss these medicines with your pharmacist including their side effects and potential interactions with your medicines.   Follow up with your primary care doctor in 1 week for re-evaluation. Return to the emergency department for any new or worsening symptoms or concerns.

## 2018-01-09 NOTE — ED Provider Notes (Signed)
[facility] EMERGENCY DEPARTMENT Provider Note   CSN: [id number] Arrival date & time: 01/09/18  0907     History   Chief Complaint Chief Complaint  Patient presents with  . Chest Pain    HPI Jodi Mcgee is a 32 y.o. female with a hx of tobacco abuse, DM, IBS, and pulmonary embolism who presents to the ED for continued worsening chest pain over the past 2 weeks. Patient seen in the ED and diagnosed with post partum pulmonary embolism 02/08, started on Lovenox, was somewhat noncompliant, after missing doses repeat CT scan was performed in the ED 02/26 that did not show evidence of significant change in clot burden since prior CT. Patient states she has been compliant with her Lovenox. States that she is having continued R sided chest pain and upper back pain specifically with inspiration/expiration. States that she is not short of breath despite triage note, states it is more that it hurts to breath. She is taking Tramadol at home with some improvement. She has additionally had dry cough which worsens pain. No other specific alleviating/aggravating factors. Denies fever, chills, congestion, sore throat, ear pain, palpitations, hemoptysis, or leg pain/swelling.   HPI  Past Medical History:  Diagnosis Date  . Anemia   . Anxiety   . Blood transfusion without reported diagnosis    both CS  . Diabetes mellitus without complication ([place])   . GERD (gastroesophageal reflux disease)    has resolved  . IBS (irritable bowel syndrome)   . Ovarian cyst   . Peritonitis, acute generalized ([place])   . Sepsis [facility])     Patient Active Problem List   Diagnosis Date Noted  . History of cesarean section, classical 01/03/2018  . Chronic hypertension 01/03/2018  . IUD contraception 01/03/2018  . Postpartum care and examination 01/03/2018  . Pulmonary embolism during puerperium 12/04/2017  . Pulmonary edema 11/30/2017  . S/P cesarean section 11/21/2017  . Pelvic adhesive  disease 10/22/2017  . Nasal septal perforation 10/17/2017  . History of cocaine abuse 10/17/2017  . Epistaxis 10/17/2017  . 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 Obesity (BMI 30-39.9) 09/25/2017  . Hypertension in pregnancy, preeclampsia, severe, delivered/postpartum 09/13/2017  . Previous cesarean section complicating pregnancy 33/82/5053  . Type 2 diabetes mellitus, without long-term current use of insulin ([place]) 08/23/2017  . Chronic pain syndrome 08/23/2017    Past Surgical History:  Procedure Laterality Date  . APPENDECTOMY     ruptured   . CESAREAN SECTION    . CESAREAN SECTION Bilateral 11/20/2017   Procedure: CESAREAN SECTION;  Surgeon: Sloan Leiter, MD;  Location: [place];  Service: Obstetrics;  Laterality: Bilateral;  . OVARIAN CYST DRAINAGE    . SMALL BOWEL REPAIR      OB History    Gravida Para Term Preterm AB Living   3 3 1 2  0 3   SAB TAB Ectopic Multiple Live Births   0 0 0 0 3       Home Medications    Prior to Admission medications   Medication Sig Start Date End Date Taking? Authorizing Provider  amLODipine (NORVASC) 10 MG tablet Take 1 tablet (10 mg total) by mouth daily. 01/03/18  Yes Chancy Milroy, MD  aspirin EC 81 MG tablet Take 81 mg by mouth daily.   Yes [provider]  enalapril (VASOTEC) 5 MG tablet Take 1 tablet (5 mg total) by mouth daily. 01/03/18  Yes Chancy Milroy, MD  enoxaparin (  LOVENOX) 120 MG/0.8ML injection Inject 0.72 mLs (110 mg total) into the skin every 12 (twelve) hours. 01/03/18  Yes Chancy Milroy, MD  metFORMIN (GLUCOPHAGE) 1000 MG tablet Take 1 tablet (1,000 mg total) by mouth 2 (two) times daily with a meal. 08/23/17  Yes Constant, Peggy, MD  traMADol (ULTRAM) 50 MG tablet Take 1 tablet (50 mg total) by mouth every 6 (six) hours as needed. 01/03/18  Yes Chancy Milroy, MD  triamcinolone (KENALOG) 0.025 % ointment Apply 1 application topically daily as needed (eczema).   Yes [provider]    Family History Family History  Problem Relation Age of Onset  . Hypertension Mother   . Anemia Mother   . Diabetes Father     Social History Social History   Tobacco Use  . Smoking status: Current Some Day Smoker    Packs/day: 0.50    Types: Cigarettes  . Smokeless tobacco: Never Used  Substance Use Topics  . Alcohol use: No  . Drug use: No     Allergies   Cetirizine & related; Toradol [ketorolac tromethamine]; Contrast media [iodinated diagnostic agents]; and Nsaids   Review of Systems Review of Systems  Constitutional: Negative for chills and fever.  HENT: Negative for ear pain, rhinorrhea and sore throat.   Eyes: Negative for visual disturbance.  Respiratory: Positive for cough. Negative for shortness of breath.   Cardiovascular: Positive for chest pain. Negative for palpitations and leg swelling.  Gastrointestinal: Negative for vomiting.  Musculoskeletal: Positive for back pain.  Neurological: Negative for syncope, weakness and numbness.  All other systems reviewed and are negative.    Physical Exam Updated Vital Signs BP 122/75 (BP Location: Left Arm)   Pulse 73   Temp 98.8 F (37.1 C) (Oral)   Resp 15   Ht 5\' 7"  (1.702 m)   Wt 108.9 kg (240 lb)   SpO2 100%   BMI 37.59 kg/m   Physical Exam  Constitutional: She appears well-developed and well-nourished. No distress.  HENT:  Head: Normocephalic and atraumatic.  Right Ear: Tympanic membrane is not perforated, not erythematous, not retracted and not bulging.  Left Ear: Tympanic membrane is not perforated, not erythematous, not retracted and not bulging.  Nose: Nose normal.  Mouth/Throat: Uvula is midline and oropharynx is clear and moist. No oropharyngeal exudate or posterior oropharyngeal erythema.  Eyes: Conjunctivae are normal. Pupils are equal, round, and reactive to light. Right eye exhibits no discharge. Left eye exhibits no discharge.  Neck: Normal range of motion. Neck supple.    Cardiovascular: Normal rate and regular rhythm.  No murmur heard. Pulses:      Radial pulses are 2+ on the right side, and 2+ on the left side.  Pulmonary/Chest: Effort normal and breath sounds normal. No respiratory distress. She has no wheezes. She has no rhonchi. She has no rales. She exhibits tenderness (Right sided anterior and posterior chest wall). She exhibits no crepitus, no edema, no deformity, no swelling and no retraction.  Abdominal: Soft. She exhibits no distension. There is no tenderness.  Musculoskeletal: She exhibits no edema or tenderness.  Back: no midline tenderness  Lymphadenopathy:    She has no cervical adenopathy.  Neurological: She is alert.  Skin: Skin is warm and dry. No rash noted.  Psychiatric: She has a normal mood and affect. Her behavior is normal.  Nursing note and vitals reviewed.  ED Treatments / Results  Labs Results for orders placed or performed during the hospital encounter of 01/09/18  CBC with Differential  Result Value Ref Range   WBC 4.2 4.0 - 10.5 K/uL   RBC 4.03 3.87 - 5.11 MIL/uL   Hemoglobin 9.8 (L) 12.0 - 15.0 g/dL   HCT 32.0 (L) 36.0 - 46.0 %   MCV 79.4 78.0 - 100.0 fL   MCH 24.3 (L) 26.0 - 34.0 pg   MCHC 30.6 30.0 - 36.0 g/dL   RDW 17.6 (H) 11.5 - 15.5 %   Platelets 286 150 - 400 K/uL   Neutrophils Relative % 68 %   Neutro Abs 2.8 1.7 - 7.7 K/uL   Lymphocytes Relative 25 %   Lymphs Abs 1.1 0.7 - 4.0 K/uL   Monocytes Relative 5 %   Monocytes Absolute 0.2 0.1 - 1.0 K/uL   Eosinophils Relative 2 %   Eosinophils Absolute 0.1 0.0 - 0.7 K/uL   Basophils Relative 0 %   Basophils Absolute 0.0 0.0 - 0.1 K/uL  Comprehensive metabolic panel  Result Value Ref Range   Sodium 137 135 - 145 mmol/L   Potassium 3.7 3.5 - 5.1 mmol/L   Chloride 105 101 - 111 mmol/L   CO2 25 22 - 32 mmol/L   Glucose, Bld 91 65 - 99 mg/dL   BUN 7 6 - 20 mg/dL   Creatinine, Ser 0.79 0.44 - 1.00 mg/dL   Calcium 8.8 (L) 8.9 - 10.3 mg/dL   Total Protein 7.0  6.5 - 8.1 g/dL   Albumin 3.4 (L) 3.5 - 5.0 g/dL   AST 23 15 - 41 U/L   ALT 30 14 - 54 U/L   Alkaline Phosphatase 81 38 - 126 U/L   Total Bilirubin 0.6 0.3 - 1.2 mg/dL   GFR calc non Af Amer >60 >60 mL/min   GFR calc Af Amer >60 >60 mL/min   Anion gap 7 5 - 15  D-dimer, quantitative (not at [facility])  Result Value Ref Range   D-Dimer, Quant 0.35 0.00 - 0.50 ug/mL-FEU  I-Stat Troponin, ED (not at [facility])  Result Value Ref Range   Troponin i, poc 0.00 0.00 - 0.08 ng/mL   Comment 3           EKG  EKG Interpretation  Date/Time:  Wednesday January 09 2018 09:13:11 EDT Ventricular Rate:  74 PR Interval:  152 QRS Duration: 84 QT Interval:  376 QTC Calculation: 417 R Axis:   73 Text Interpretation:  Normal sinus rhythm Normal ECG Confirmed by Isla Pence [phone]) on 01/09/2018 11:11:35 AM       Radiology Dg Chest 2 View  Result Date: 01/09/2018 CLINICAL DATA:  Increasing shortness of breath, chest pain, history of recent pulmonary embolism, currently on Lovenox EXAM: CHEST - 2 VIEW COMPARISON:  CT chest of 12/18/2017 and chest x-ray of 12/15/2017 FINDINGS: No pneumonia or pleural effusion is seen. The lungs are not optimally aerated. Mediastinal and hilar contours are unremarkable. The heart is borderline enlarged and stable. No acute bony abnormality is seen. IMPRESSION: Suboptimal inspiration.  No definite active process. Electronically Signed   By: Ivar Drape M.D.   On: 01/09/2018 10:01   Previous Results Reviewed:  IMPRESSION: 1. Limited evaluation for pulmonary embolism due to respiratory motion artifact and timing of the contrast. No significant interval change in the left lower lobe segmental pulmonary artery clot burden since the prior CT. No new large central emboli. 2. Midline vertical anterior pelvic wall as well as vertical anterior uterine C-section incisions. No fluid collection or abscess.   Electronically Signed   By:  Anner Crete M.D.   On: 12/18/2017  23:09 Procedures Procedures (including critical care time)  Medications Ordered in ED Medications  traMADol (ULTRAM) tablet 100 mg (not administered)  acetaminophen (TYLENOL) tablet 650 mg (not administered)   Initial Impression / Assessment and Plan / ED Course  I have reviewed the triage vital signs and the nursing notes.  Pertinent labs & imaging results that were available during my care of the patient were reviewed by me and considered in my medical decision making (see chart for details).   Patient presents with continued R sided chest and upper back pain which has been present since dx of pulmonary embolism which patient reports seems somewhat worse over past few weeks. Patient is nontoxic appearing, in no apparent distress, vitals without significant abnormality, no signs of respiratory distress. Triage work-up initiated and reviewed. Patient's hgb of 9.8 is slightly decreased from when last checked, but is consistent with general baseline hgb ranging from 9.2-10.7. Of note troponin and d-dimer negative. No significant electrolyte abnormalities. EKG with NSR. CXR with suboptimal inspiration, but no acute abnormality. On my exam there are no signs of respiratory distress, patient's pain is reproducible with anterior/posterior chest wall palpation. patient's lungs are CTA, she is afebrile, doubt pneumonia. EKG without significant ST/T wave changes, troponin negative after > 3 hours of pain, doubt ACS. Pain is not a tearing sensation, 2+ symmetric pulses, no widening of mediastinum on CXR, doubt dissection. Patient with known pulmonary embolism- prior repeat CT reviewed did not show significant change in overall clot burden, patient has been compliant with her Lovenox, do not think that repeat imaging is necessary at this time. Discussed importance of continued compliance with Lovenox. Instructed patient to continue Tramadol for pain, provided prescriptions for Tylenol and for Tessalon. I  discussed results, treatment plan, need for PCP follow-up, and return precautions with the patient. Provided opportunity for questions, patient confirmed understanding and is in agreement with plan.   Findings and plan of care discussed with supervising physician Dr. Gilford Raid who is in agreement.  Vitals:   01/09/18 1315 01/09/18 1330  BP: 112/87 127/83  Pulse: 70 77  Resp:    Temp:    SpO2: 99% 99%    Final Clinical Impressions(s) / ED Diagnoses   Final diagnoses:  Chest pain, unspecified type    ED Discharge Orders        Ordered    acetaminophen (TYLENOL 8 HOUR) 650 MG CR tablet  Every 8 hours PRN     01/09/18 1312    benzonatate (TESSALON) 100 MG capsule  3 times daily PRN     01/09/18 1318       Namari Breton, [place] R, PA-C 01/09/18 1506    Isla Pence, MD 01/09/18 1542

## 2018-01-09 NOTE — ED Triage Notes (Signed)
Pt diagnosed with PE on Feb 8th, taking lovenox. MD did CT scan on 21st of Feb and MD states it may be getting bigger. She is having increasing SOB and chest pains. No distress noted in triage. NSR. No increase in respiratory effort noted.

## 2018-01-11 NOTE — Telephone Encounter (Signed)
I called the pharmacy and they stated the issue had been addressed and the rx filled.

## 2018-01-14 ENCOUNTER — Encounter: Payer: Self-pay | Admitting: *Deleted

## 2018-02-04 ENCOUNTER — Ambulatory Visit: Payer: Self-pay | Admitting: Obstetrics and Gynecology

## 2018-02-15 ENCOUNTER — Encounter (HOSPITAL_COMMUNITY): Payer: Self-pay | Admitting: *Deleted

## 2018-02-15 ENCOUNTER — Inpatient Hospital Stay (HOSPITAL_COMMUNITY)
Admission: AD | Admit: 2018-02-15 | Discharge: 2018-02-15 | Disposition: A | Discharge status: Home / Self Care | Payer: Medicaid Other | Source: Ambulatory Visit | Attending: Obstetrics & Gynecology | Admitting: Obstetrics & Gynecology

## 2018-02-15 ENCOUNTER — Inpatient Hospital Stay (HOSPITAL_COMMUNITY): Payer: Medicaid Other

## 2018-02-15 DIAGNOSIS — Z7982 Long term (current) use of aspirin: Secondary | ICD-10-CM | POA: Diagnosis not present

## 2018-02-15 DIAGNOSIS — R102 Pelvic and perineal pain: Secondary | ICD-10-CM | POA: Diagnosis not present

## 2018-02-15 DIAGNOSIS — Z975 Presence of (intrauterine) contraceptive device: Secondary | ICD-10-CM | POA: Diagnosis not present

## 2018-02-15 DIAGNOSIS — Z87891 Personal history of nicotine dependence: Secondary | ICD-10-CM | POA: Insufficient documentation

## 2018-02-15 DIAGNOSIS — N939 Abnormal uterine and vaginal bleeding, unspecified: Secondary | ICD-10-CM | POA: Diagnosis present

## 2018-02-15 DIAGNOSIS — R109 Unspecified abdominal pain: Secondary | ICD-10-CM | POA: Diagnosis present

## 2018-02-15 DIAGNOSIS — T8332XA Displacement of intrauterine contraceptive device, initial encounter: Secondary | ICD-10-CM

## 2018-02-15 DIAGNOSIS — R079 Chest pain, unspecified: Secondary | ICD-10-CM | POA: Insufficient documentation

## 2018-02-15 DIAGNOSIS — E119 Type 2 diabetes mellitus without complications: Secondary | ICD-10-CM | POA: Diagnosis not present

## 2018-02-15 DIAGNOSIS — Z86711 Personal history of pulmonary embolism: Secondary | ICD-10-CM | POA: Insufficient documentation

## 2018-02-15 DIAGNOSIS — Z7984 Long term (current) use of oral hypoglycemic drugs: Secondary | ICD-10-CM | POA: Insufficient documentation

## 2018-02-15 DIAGNOSIS — N921 Excessive and frequent menstruation with irregular cycle: Secondary | ICD-10-CM

## 2018-02-15 HISTORY — DX: Other pulmonary embolism without acute cor pulmonale: I26.99

## 2018-02-15 LAB — URINALYSIS, ROUTINE W REFLEX MICROSCOPIC
BILIRUBIN URINE: NEGATIVE
GLUCOSE, UA: NEGATIVE mg/dL
KETONES UR: NEGATIVE mg/dL
NITRITE: NEGATIVE
PH: 6 (ref 5.0–8.0)
PROTEIN: NEGATIVE mg/dL
Specific Gravity, Urine: 1.023 (ref 1.005–1.030)

## 2018-02-15 LAB — COMPREHENSIVE METABOLIC PANEL
ALT: 29 U/L (ref 14–54)
ANION GAP: 8 (ref 5–15)
AST: 19 U/L (ref 15–41)
Albumin: 3.7 g/dL (ref 3.5–5.0)
Alkaline Phosphatase: 78 U/L (ref 38–126)
BUN: 15 mg/dL (ref 6–20)
CHLORIDE: 107 mmol/L (ref 101–111)
CO2: 25 mmol/L (ref 22–32)
CREATININE: 0.71 mg/dL (ref 0.44–1.00)
Calcium: 8.9 mg/dL (ref 8.9–10.3)
Glucose, Bld: 120 mg/dL — ABNORMAL HIGH (ref 65–99)
Potassium: 4.2 mmol/L (ref 3.5–5.1)
SODIUM: 140 mmol/L (ref 135–145)
Total Bilirubin: 0.2 mg/dL — ABNORMAL LOW (ref 0.3–1.2)
Total Protein: 7.7 g/dL (ref 6.5–8.1)

## 2018-02-15 LAB — CBC
HCT: 33.9 % — ABNORMAL LOW (ref 36.0–46.0)
Hemoglobin: 10.4 g/dL — ABNORMAL LOW (ref 12.0–15.0)
MCH: 24 pg — AB (ref 26.0–34.0)
MCHC: 30.7 g/dL (ref 30.0–36.0)
MCV: 78.1 fL (ref 78.0–100.0)
PLATELETS: 232 10*3/uL (ref 150–400)
RBC: 4.34 MIL/uL (ref 3.87–5.11)
RDW: 16.6 % — AB (ref 11.5–15.5)
WBC: 4 10*3/uL (ref 4.0–10.5)

## 2018-02-15 LAB — POCT PREGNANCY, URINE: Preg Test, Ur: NEGATIVE

## 2018-02-15 MED ORDER — OXYCODONE-ACETAMINOPHEN 5-325 MG PO TABS
2.0000 | ORAL_TABLET | Freq: Once | ORAL | Status: AC
  Administered 2018-02-15: 2 via ORAL
  Filled 2018-02-15: qty 2

## 2018-02-15 MED ORDER — DIPHENHYDRAMINE HCL 50 MG/ML IJ SOLN
25.0000 mg | Freq: Once | INTRAMUSCULAR | Status: AC
  Administered 2018-02-15: 25 mg via INTRAVENOUS
  Filled 2018-02-15: qty 1

## 2018-02-15 MED ORDER — DIPHENHYDRAMINE HCL 50 MG/ML IJ SOLN
50.0000 mg | Freq: Once | INTRAMUSCULAR | Status: AC
  Administered 2018-02-15: 50 mg via INTRAVENOUS
  Filled 2018-02-15: qty 1

## 2018-02-15 MED ORDER — IOPAMIDOL (ISOVUE-370) INJECTION 76%
100.0000 mL | Freq: Once | INTRAVENOUS | Status: AC | PRN
  Administered 2018-02-15: 100 mL via INTRAVENOUS

## 2018-02-15 MED ORDER — HYDROCORTISONE NA SUCCINATE PF 250 MG IJ SOLR
200.0000 mg | Freq: Once | INTRAMUSCULAR | Status: AC
  Administered 2018-02-15: 200 mg via INTRAVENOUS
  Filled 2018-02-15: qty 200

## 2018-02-15 NOTE — MAU Provider Note (Signed)
History    CSN: [id number]  Arrival date and time: 02/15/18 1884   First Provider Initiated Contact with Patient 02/15/18 1025     Chief Complaint  Patient presents with  . Abdominal Pain  . Vaginal Bleeding   HPI Jodi Mcgee is a 32 year old G3P1203 non-pregnant female who presented to MAU at [facility] on 12/18/2017 due constant cramping/ vaginal bleeding and chest pain. Patient has a positive history for a recent pulmonary embolism in February with noncompliance to Lovenox. She recently had a c-section January 29th.   Vaginal Bleeding & Cramping: Patient admits that she has been having vaginal bleeding and cramping since her IUD placement in mid-March. She reports that her vaginal bleeding and cramping have been constant, achy and sometimes sharp in character, and is no longer relieved with pain medications in which she has tried Tramadol, Oxycodone, and Tylenol. She admits that her vaginal bleeding is now heavier than her normal menstrual cycle.  Chest Pain:  Patient admits to having constant chest pain for about 2 weeks. She recently had a right-sided pulmonary embolism in February in which she was on Lovenox, but recently stopped the medication about 2-3 weeks ago due to insurance issues. Patient reports that it feels like a brick on her chest and is exacerbated by movement. She admits to a cough x 2 weeks, intermittent palpitations, and exertional dyspnea.  OB History    Gravida  3   Para  3   Term  1   Preterm  2   AB  0   Living  3     SAB  0   TAB  0   Ectopic  0   Multiple  0   Live Births  3           Past Medical History:  Diagnosis Date  . Anemia   . Anxiety   . Blood transfusion without reported diagnosis    both CS  . Diabetes mellitus without complication ([place])   . GERD (gastroesophageal reflux disease)    has resolved  . IBS (irritable bowel syndrome)   . Ovarian cyst   . Peritonitis, acute generalized ([place])   . Pulmonary  embolism ([place])   . Sepsis [facility])     Past Surgical History:  Procedure Laterality Date  . APPENDECTOMY     ruptured   . CESAREAN SECTION    . CESAREAN SECTION Bilateral 11/20/2017   Procedure: CESAREAN SECTION;  Surgeon: Sloan Leiter, MD;  [facility];  Service: Obstetrics;  Laterality: Bilateral;  . OVARIAN CYST DRAINAGE    . SMALL BOWEL REPAIR      Family History  Problem Relation Age of Onset  . Hypertension Mother   . Anemia Mother   . Diabetes Father     Social History   Tobacco Use  . Smoking status: Former Smoker    Packs/day: 0.50    Types: Cigarettes  . Smokeless tobacco: Never Used  Substance Use Topics  . Alcohol use: No  . Drug use: No    Allergies:  Allergies  Allergen Reactions  . Cetirizine & Related Itching and Swelling  . Toradol [Ketorolac Tromethamine] Hives and Itching  . Contrast Media [Iodinated Diagnostic Agents] Itching    Mild itching after IV contrast on 6/1/7 No urticaria visible No wheezing No angioedema  . Nsaids Rash    Medications Prior to Admission  Medication Sig Dispense Refill Last Dose  . acetaminophen (TYLENOL 8 HOUR) 650 MG CR  tablet Take 1 tablet (650 mg total) by mouth every 8 (eight) hours as needed for pain. 30 tablet 0   . aspirin EC 81 MG tablet Take 81 mg by mouth daily.   01/08/2018 at Unknown time  . benzonatate (TESSALON) 100 MG capsule Take 1 capsule (100 mg total) by mouth 3 (three) times daily as needed for cough. 30 capsule 0   . enalapril (VASOTEC) 5 MG tablet Take 1 tablet (5 mg total) by mouth daily. 30 tablet 1 01/09/2018 at Unknown time  . enoxaparin (LOVENOX) 120 MG/0.8ML injection Inject 0.72 mLs (110 mg total) into the skin every 12 (twelve) hours. 1 mL 1 01/09/2018 at 7a  . metFORMIN (GLUCOPHAGE) 1000 MG tablet Take 1 tablet (1,000 mg total) by mouth 2 (two) times daily with a meal. 60 tablet 3 01/09/2018 at Unknown time  . traMADol (ULTRAM) 50 MG tablet Take 1 tablet (50 mg total) by  mouth every 6 (six) hours as needed. 60 tablet 0 01/08/2018 at Unknown time  . triamcinolone (KENALOG) 0.025 % ointment Apply 1 application topically daily as needed (eczema).   01/08/2018 at Unknown time    Review of Systems  Constitutional: Negative.  Negative for fatigue and fever.  HENT: Negative.   Respiratory: Positive for cough, chest tightness and shortness of breath.   Cardiovascular: Positive for chest pain and palpitations. Negative for leg swelling.  Gastrointestinal: Positive for abdominal pain. Negative for constipation, diarrhea, nausea and vomiting.  Genitourinary: Positive for vaginal bleeding. Negative for dysuria.  Neurological: Negative.  Negative for dizziness and headaches.   Physical Exam   Blood pressure 116/81, pulse 76, temperature 98.4 F (36.9 C), temperature source Oral, resp. rate 16, height 5\' 6"  (1.676 m), weight 239 lb 4 oz (108.5 kg), SpO2 100 %, not currently breastfeeding.  Physical Exam  Nursing note and vitals reviewed. Constitutional: She is oriented to person, place, and time. Vital signs are normal. She appears well-developed and well-nourished. No distress.  Appears comfortable, laughing and texting on phone  HENT:  Head: Normocephalic.  Eyes: Pupils are equal, round, and reactive to light.  Cardiovascular: Normal rate, regular rhythm and normal heart sounds.  Respiratory: Effort normal. No accessory muscle usage. No tachypnea. No respiratory distress. She has decreased breath sounds in the right lower field. She has no wheezes. She has no rales. She exhibits no tenderness.  GI: Soft. Bowel sounds are normal. She exhibits no distension. There is no tenderness.  Genitourinary:  Genitourinary Comments: Small amount of dark brown vaginal bleeding noted. IUD strings not visualized. IUD not palpated on bimanual exam  Neurological: She is alert and oriented to person, place, and time.  Skin: Skin is warm and dry.  Psychiatric: She has a normal mood  and affect. Her behavior is normal. Judgment and thought content normal.    MAU Course  Procedures Results for orders placed or performed during the hospital encounter of 02/15/18 (from the past 24 hour(s))  Urinalysis, Routine w reflex microscopic     Status: Abnormal   Collection Time: 02/15/18 10:05 AM  Result Value Ref Range   Color, Urine YELLOW YELLOW   APPearance HAZY (A) CLEAR   Specific Gravity, Urine 1.023 1.005 - 1.030   pH 6.0 5.0 - 8.0   Glucose, UA NEGATIVE NEGATIVE mg/dL   Hgb urine dipstick LARGE (A) NEGATIVE   Bilirubin Urine NEGATIVE NEGATIVE   Ketones, ur NEGATIVE NEGATIVE mg/dL   Protein, ur NEGATIVE NEGATIVE mg/dL   Nitrite NEGATIVE NEGATIVE  Leukocytes, UA TRACE (A) NEGATIVE   RBC / HPF >50 (H) 0 - 5 RBC/hpf   WBC, UA 6-10 0 - 5 WBC/hpf   Bacteria, UA RARE (A) NONE SEEN   Squamous Epithelial / LPF 6-10 0 - 5   Mucus PRESENT   Pregnancy, urine POC     Status: None   Collection Time: 02/15/18 10:07 AM  Result Value Ref Range   Preg Test, Ur NEGATIVE NEGATIVE  CBC     Status: Abnormal   Collection Time: 02/15/18 10:32 AM  Result Value Ref Range   WBC 4.0 4.0 - 10.5 K/uL   RBC 4.34 3.87 - 5.11 MIL/uL   Hemoglobin 10.4 (L) 12.0 - 15.0 g/dL   HCT 33.9 (L) 36.0 - 46.0 %   MCV 78.1 78.0 - 100.0 fL   MCH 24.0 (L) 26.0 - 34.0 pg   MCHC 30.7 30.0 - 36.0 g/dL   RDW 16.6 (H) 11.5 - 15.5 %   Platelets 232 150 - 400 K/uL  Comprehensive metabolic panel     Status: Abnormal   Collection Time: 02/15/18 10:32 AM  Result Value Ref Range   Sodium 140 135 - 145 mmol/L   Potassium 4.2 3.5 - 5.1 mmol/L   Chloride 107 101 - 111 mmol/L   CO2 25 22 - 32 mmol/L   Glucose, Bld 120 (H) 65 - 99 mg/dL   BUN 15 6 - 20 mg/dL   Creatinine, Ser 0.71 0.44 - 1.00 mg/dL   Calcium 8.9 8.9 - 10.3 mg/dL   Total Protein 7.7 6.5 - 8.1 g/dL   Albumin 3.7 3.5 - 5.0 g/dL   AST 19 15 - 41 U/L   ALT 29 14 - 54 U/L   Alkaline Phosphatase 78 38 - 126 U/L   Total Bilirubin 0.2 (L) 0.3 -  1.2 mg/dL   GFR calc non Af Amer >60 >60 mL/min   GFR calc Af Amer >60 >60 mL/min   Anion gap 8 5 - 15   Ct Angio Chest Pe W And/or Wo Contrast  Result Date: 02/15/2018 CLINICAL DATA:  Patient diagnosed with pulmonary embolism on 11/30/2017. Patient has stopped her Lovenox. Now with shortness of breath and chest pain. EXAM: CT ANGIOGRAPHY CHEST WITH CONTRAST TECHNIQUE: Multidetector CT imaging of the chest was performed using the standard protocol during bolus administration of intravenous contrast. Multiplanar CT image reconstructions and MIPs were obtained to evaluate the vascular anatomy. CONTRAST:  11mL ISOVUE-370 IOPAMIDOL (ISOVUE-370) INJECTION 76% COMPARISON:  Chest radiograph, 01/19/2018. Chest CTA, 12/18/2017 and 11/30/2017. FINDINGS: Cardiovascular: Opacification of the pulmonary arteries is slightly suboptimal, less densely opacified than the aorta. This affects the upper lobe segmental branches to the greatest degree. Allowing for this limitation, there is no convincing residual or new pulmonary embolism. Heart is normal in size and configuration. No pericardial effusion. Great vessels are normal in caliber. No aortic dissection or atherosclerosis. Mediastinum/Nodes: No enlarged mediastinal, hilar, or axillary lymph nodes. Thyroid gland, trachea, and esophagus demonstrate no significant findings. Lungs/Pleura: Linear opacity noted in right middle lobe consistent with atelectasis. Lungs otherwise clear with no evidence of pneumonia or pulmonary edema. No pleural effusion or pneumothorax. Upper Abdomen: No acute abnormality. Musculoskeletal: No chest wall abnormality. No acute or significant osseous findings. Review of the MIP images confirms the above findings. IMPRESSION: 1. Somewhat limited exam as detailed above. Allowing for this, there is no evidence of a residual or new pulmonary embolism. 2. No acute findings. Electronically Signed   By: Lajean Manes  M.D.   On: 02/15/2018 16:29   US  Pelvic Complete With Transvaginal  Result Date: 02/15/2018 CLINICAL DATA:  Pelvic pain, lost IUD strings, IUD placed in March 2019, history diabetes mellitus, ovarian cyst; postpartum January 2019 delivery EXAM: TRANSABDOMINAL AND TRANSVAGINAL ULTRASOUND OF PELVIS TECHNIQUE: Both transabdominal and transvaginal ultrasound examinations of the pelvis were performed. Transabdominal technique was performed for global imaging of the pelvis including uterus, ovaries, adnexal regions, and pelvic cul-de-sac. It was necessary to proceed with endovaginal exam following the transabdominal exam to visualize the endometrium. COMPARISON:  None; correlation CT abdomen pelvis 12/18/2017 FINDINGS: Uterus Measurements: 10.6 x 5.5 x 6.6 cm. Mildly retroflexed. Otherwise normal morphology without mass. Small question leiomyomata on prior CT exam not visualized. Endometrium Endometrial complex poorly visualized due to IUD which is located at the upper uterine segment in expected position. Additional linear echogenic foci are seen at the lower uterine segment into cervix question the strings of the IUD. Right ovary Measurements: 3.3 x 2.7 x 2.7 cm.  Normal morphology without mass Left ovary Measurements: 2.7 x 1.9 x 2.7 cm.  Normal morphology without mass Other findings No adnexal masses or free pelvic fluid. IMPRESSION: IUD noted at the upper uterine segment with additional linear echogenic foci at the lower uterine segment into cervix question strings of the IUD. Otherwise unremarkable uterus and adnexa. Electronically Signed   By: Lavonia Dana M.D.   On: 02/15/2018 13:04   MDM UA, UPT CBC, CMP ED EKG CT Angio Chest- no evidence of PE Patient reports itching allergy to contrast- will give pre-medication regimes 200mg  Hydrocortisone 200mg   IV 50mg  Benedryl IV  US Pelvis Complete with Transvaginal- IUD in place Percocet PO  Reassurance of normalcy of today's exam given to patient. Discussed normalcy of irregular bleeding  after IUD placement.   Assessment and Plan   1. Breakthrough bleeding associated with intrauterine device (IUD)   2. Pelvic pain   3. IUD strings lost   4. IUD (intrauterine device) in place   5. History of pulmonary embolus (PE)    -Discharge home in stable condition -Vaginal bleeding and pain precautions discussed -Patient advised to follow-up with PCP and gyn for management of IUD -Patient may return to MAU as needed or if her condition were to change or worsen  Wende Mott CNM 02/15/2018, 10:25 AM

## 2018-02-15 NOTE — MAU Provider Note (Signed)
History   Jodi Mcgee is a 32 year old G3P1203 non-pregnant female who presented to MAU at [facility] on 12/18/2017 due constant cramping/ vaginal bleeding and chest pain. Patient has a positive history for a recent pulmonary embolism in February with noncompliance to Lovenox. She recently had a c-section January 29th.  Vaginal Bleeding & Cramping: Patient admits that she has been having vaginal bleeding and cramping since her IUD placement in mid-March. She reports that her vaginal bleeding and cramping have been constant, achy and sometimes sharp in character, and is no longer relieved with pain medications in which she has tried Tramadol, Oxycodone, and Tylenol. She admits that her vaginal bleeding is now heavier than her normal menstrual cycle.  Chest Pain:  Patient admits to having constant chest pain for about 2 weeks. She recently had a right-sided pulmonary embolism in February in which she was on Lovenox, but recently stopped the medication about 2-3 weeks ago due to insurance issues. Patient reports that it feels like a brick on her chest and is exacerbated by movement. She admits to a cough x 2 weeks, intermittent palpitations, and exertional dyspnea.  CSN: [id number]  Arrival date and time: 02/15/18 2725   First Provider Initiated Contact with Patient 02/15/18 1025      Chief Complaint  Patient presents with  . Abdominal Pain  . Vaginal Bleeding     Past Medical History:  Diagnosis Date  . Anemia   . Anxiety   . Blood transfusion without reported diagnosis    both CS  . Diabetes mellitus without complication ([place])   . GERD (gastroesophageal reflux disease)    has resolved  . IBS (irritable bowel syndrome)   . Ovarian cyst   . Peritonitis, acute generalized ([place])   . Pulmonary embolism ([place])   . Sepsis [facility])     Past Surgical History:  Procedure Laterality Date  . APPENDECTOMY     ruptured   . CESAREAN SECTION    . CESAREAN SECTION Bilateral 11/20/2017    Procedure: CESAREAN SECTION;  Surgeon: Sloan Leiter, MD;  Location: [facility];  Service: Obstetrics;  Laterality: Bilateral;  . OVARIAN CYST DRAINAGE    . SMALL BOWEL REPAIR      Family History  Problem Relation Age of Onset  . Hypertension Mother   . Anemia Mother   . Diabetes Father     Social History   Tobacco Use  . Smoking status: Former Smoker    Packs/day: 0.50    Types: Cigarettes  . Smokeless tobacco: Never Used  Substance Use Topics  . Alcohol use: No  . Drug use: No    Allergies:  Allergies  Allergen Reactions  . Cetirizine & Related Itching and Swelling  . Toradol [Ketorolac Tromethamine] Hives and Itching  . Contrast Media [Iodinated Diagnostic Agents] Itching    Mild itching after IV contrast on 6/1/7 No urticaria visible No wheezing No angioedema  . Nsaids Rash    Medications Prior to Admission  Medication Sig Dispense Refill Last Dose  . acetaminophen (TYLENOL 8 HOUR) 650 MG CR tablet Take 1 tablet (650 mg total) by mouth every 8 (eight) hours as needed for pain. 30 tablet 0   . aspirin EC 81 MG tablet Take 81 mg by mouth daily.   01/08/2018 at Unknown time  . benzonatate (TESSALON) 100 MG capsule Take 1 capsule (100 mg total) by mouth 3 (three) times daily as needed for cough. 30 capsule 0   . enalapril (VASOTEC)  5 MG tablet Take 1 tablet (5 mg total) by mouth daily. 30 tablet 1 01/09/2018 at Unknown time  . enoxaparin (LOVENOX) 120 MG/0.8ML injection Inject 0.72 mLs (110 mg total) into the skin every 12 (twelve) hours. 1 mL 1 01/09/2018 at 7a  . metFORMIN (GLUCOPHAGE) 1000 MG tablet Take 1 tablet (1,000 mg total) by mouth 2 (two) times daily with a meal. 60 tablet 3 01/09/2018 at Unknown time  . traMADol (ULTRAM) 50 MG tablet Take 1 tablet (50 mg total) by mouth every 6 (six) hours as needed. 60 tablet 0 01/08/2018 at Unknown time  . triamcinolone (KENALOG) 0.025 % ointment Apply 1 application topically daily as needed (eczema).   01/08/2018 at  Unknown time    Review of Systems  Constitutional: Negative.   HENT: Negative.   Respiratory: Positive for cough (x2 weeks), chest tightness and shortness of breath. Negative for apnea and wheezing.   Cardiovascular: Positive for chest pain and palpitations. Negative for leg swelling.  Gastrointestinal: Positive for abdominal pain. Negative for diarrhea, nausea and vomiting.  Endocrine: Negative.   Genitourinary: Positive for menstrual problem, pelvic pain and vaginal bleeding.  Skin: Negative.   Allergic/Immunologic: Negative.   Hematological: Negative.   Psychiatric/Behavioral: Negative.    Physical Exam   Blood pressure 116/81, pulse 76, temperature 98.4 F (36.9 C), temperature source Oral, resp. rate 16, height 5\' 6"  (1.676 m), weight 108.5 kg (239 lb 4 oz), SpO2 100 %, not currently breastfeeding.  Physical Exam  Nursing note and vitals reviewed. Constitutional: She is oriented to person, place, and time. She appears well-developed and well-nourished. No distress.  HENT:  Head: Normocephalic.  Cardiovascular: Normal rate, regular rhythm and normal heart sounds.  No murmur heard. Respiratory: Effort normal. She has no wheezes. She has no rales.  Diminished breath sounds on the right  GI: Soft. She exhibits no distension. There is tenderness. There is no rebound and no guarding.  Neurological: She is alert and oriented to person, place, and time.  Skin: Skin is warm.  Psychiatric: She has a normal mood and affect. Her behavior is normal. Thought content normal.    MAU Course  Procedures  MDM Results for orders placed or performed during the hospital encounter of 02/15/18 (from the past 24 hour(s))  Urinalysis, Routine w reflex microscopic     Status: Abnormal   Collection Time: 02/15/18 10:05 AM  Result Value Ref Range   Color, Urine YELLOW YELLOW   APPearance HAZY (A) CLEAR   Specific Gravity, Urine 1.023 1.005 - 1.030   pH 6.0 5.0 - 8.0   Glucose, UA NEGATIVE  NEGATIVE mg/dL   Hgb urine dipstick LARGE (A) NEGATIVE   Bilirubin Urine NEGATIVE NEGATIVE   Ketones, ur NEGATIVE NEGATIVE mg/dL   Protein, ur NEGATIVE NEGATIVE mg/dL   Nitrite NEGATIVE NEGATIVE   Leukocytes, UA TRACE (A) NEGATIVE   RBC / HPF >50 (H) 0 - 5 RBC/hpf   WBC, UA 6-10 0 - 5 WBC/hpf   Bacteria, UA RARE (A) NONE SEEN   Squamous Epithelial / LPF 6-10 0 - 5   Mucus PRESENT   Pregnancy, urine POC     Status: None   Collection Time: 02/15/18 10:07 AM  Result Value Ref Range   Preg Test, Ur NEGATIVE NEGATIVE  CBC     Status: Abnormal   Collection Time: 02/15/18 10:32 AM  Result Value Ref Range   WBC 4.0 4.0 - 10.5 K/uL   RBC 4.34 3.87 - 5.11 MIL/uL  Hemoglobin 10.4 (L) 12.0 - 15.0 g/dL   HCT 33.9 (L) 36.0 - 46.0 %   MCV 78.1 78.0 - 100.0 fL   MCH 24.0 (L) 26.0 - 34.0 pg   MCHC 30.7 30.0 - 36.0 g/dL   RDW 16.6 (H) 11.5 - 15.5 %   Platelets 232 150 - 400 K/uL  Comprehensive metabolic panel     Status: Abnormal   Collection Time: 02/15/18 10:32 AM  Result Value Ref Range   Sodium 140 135 - 145 mmol/L   Potassium 4.2 3.5 - 5.1 mmol/L   Chloride 107 101 - 111 mmol/L   CO2 25 22 - 32 mmol/L   Glucose, Bld 120 (H) 65 - 99 mg/dL   BUN 15 6 - 20 mg/dL   Creatinine, Ser 0.71 0.44 - 1.00 mg/dL   Calcium 8.9 8.9 - 10.3 mg/dL   Total Protein 7.7 6.5 - 8.1 g/dL   Albumin 3.7 3.5 - 5.0 g/dL   AST 19 15 - 41 U/L   ALT 29 14 - 54 U/L   Alkaline Phosphatase 78 38 - 126 U/L   Total Bilirubin 0.2 (L) 0.3 - 1.2 mg/dL   GFR calc non Af Amer >60 >60 mL/min   GFR calc Af Amer >60 >60 mL/min   Anion gap 8 5 - 15  -Obtain CTA to rule out new/worsening pulmonary embolism -Order urine pregnancy, UA, CBC, CMP -Order EKG to check for abnormalities related to patient's chest pain -Performed speculum exam and was able to visualize the cervix, but no IUD strings were visible. Order transvaginal US to check for IUD placement -Bimanual performed with no abnormalities or tenderness  noted  Assessment and Plan  Vaginal Bleeding/Cramping: 1. Post-IUD insertion bleeding/ break through bleeding from IUD 2. Pelvic pain 3. IUD lost strings- shown to be in correct location via transvaginal US 4. History of pulmonary embolus  -Patient discharged home in stable condition -Patient was instructed to follow-up with her OB if she wants her IUD removed and to follow-up with her PCP -Patient was instructed to come back to MAU if her symptoms worsen or do not improve.  Lanna Poche, PA-S 02/15/2018, 10:58 AM

## 2018-04-23 ENCOUNTER — Encounter (HOSPITAL_COMMUNITY): Payer: Self-pay | Admitting: *Deleted

## 2018-05-13 ENCOUNTER — Encounter (HOSPITAL_COMMUNITY): Payer: Self-pay

## 2018-05-13 ENCOUNTER — Inpatient Hospital Stay (HOSPITAL_COMMUNITY)
Admission: AD | Admit: 2018-05-13 | Discharge: 2018-05-13 | Disposition: A | Discharge status: Home / Self Care | Payer: Medicaid Other | Source: Ambulatory Visit | Attending: Obstetrics and Gynecology | Admitting: Obstetrics and Gynecology

## 2018-05-13 DIAGNOSIS — G894 Chronic pain syndrome: Secondary | ICD-10-CM | POA: Diagnosis not present

## 2018-05-13 DIAGNOSIS — Z87891 Personal history of nicotine dependence: Secondary | ICD-10-CM | POA: Diagnosis not present

## 2018-05-13 DIAGNOSIS — N760 Acute vaginitis: Secondary | ICD-10-CM | POA: Insufficient documentation

## 2018-05-13 DIAGNOSIS — Z3202 Encounter for pregnancy test, result negative: Secondary | ICD-10-CM | POA: Insufficient documentation

## 2018-05-13 DIAGNOSIS — Z975 Presence of (intrauterine) contraceptive device: Secondary | ICD-10-CM

## 2018-05-13 DIAGNOSIS — Z86711 Personal history of pulmonary embolism: Secondary | ICD-10-CM | POA: Diagnosis not present

## 2018-05-13 DIAGNOSIS — R1032 Left lower quadrant pain: Secondary | ICD-10-CM | POA: Insufficient documentation

## 2018-05-13 LAB — URINALYSIS, ROUTINE W REFLEX MICROSCOPIC
Bilirubin Urine: NEGATIVE
Glucose, UA: NEGATIVE mg/dL
Hgb urine dipstick: NEGATIVE
KETONES UR: NEGATIVE mg/dL
LEUKOCYTES UA: NEGATIVE
NITRITE: NEGATIVE
PH: 7 (ref 5.0–8.0)
PROTEIN: NEGATIVE mg/dL
Specific Gravity, Urine: 1.018 (ref 1.005–1.030)

## 2018-05-13 LAB — CBC
HEMATOCRIT: 36.5 % (ref 36.0–46.0)
Hemoglobin: 11.5 g/dL — ABNORMAL LOW (ref 12.0–15.0)
MCH: 24.3 pg — AB (ref 26.0–34.0)
MCHC: 31.5 g/dL (ref 30.0–36.0)
MCV: 77 fL — AB (ref 78.0–100.0)
Platelets: 293 10*3/uL (ref 150–400)
RBC: 4.74 MIL/uL (ref 3.87–5.11)
RDW: 16 % — ABNORMAL HIGH (ref 11.5–15.5)
WBC: 4.4 10*3/uL (ref 4.0–10.5)

## 2018-05-13 LAB — POCT PREGNANCY, URINE: PREG TEST UR: NEGATIVE

## 2018-05-13 LAB — WET PREP, GENITAL
Sperm: NONE SEEN
TRICH WET PREP: NONE SEEN
WBC, Wet Prep HPF POC: NONE SEEN
YEAST WET PREP: NONE SEEN

## 2018-05-13 MED ORDER — METRONIDAZOLE 500 MG PO TABS: 500.0000 mg | ORAL_TABLET | Freq: Two times a day (BID) | ORAL | 0 refills | Status: DC

## 2018-05-13 NOTE — MAU Provider Note (Addendum)
History     CSN: [id number]  Arrival date and time: 05/13/18 1154   None     Chief Complaint  Patient presents with  . Pelvic Pain   HPI  Jodi Mcgee is a 32 y.o. [phone] non pregnant patient who presents to MAU with chief complaint of LLQ abdominal pain. Patient states pain is 6-10/10, radiates to upper left shoulder, no aggravating or alleviating factors. Patient is s/p Liletta IUD placement 01/03/2018. States she has had a period each month since placement except for the month of July.   Patient previously diagnosed with chronic pain. States current pain feels similar. Reports she did not follow up on referral to pain clinic. Currently only able to take Tylenol for pain, has not taken recently.  Endorses most recent sexual intercourse approximately one month ago  Pertinent Gynecological History: Menses: flow is moderate and regular every month without intermenstrual spotting Bleeding:  N/A Contraception: IUD DES exposure: denies Blood transfusions: none Sexually transmitted diseases: no past history and states she is tested regularly at [facility] Previous GYN Procedures: N/A  Last mammogram: N/a Date: n/a Last pap: normal Date: 01/03/2018   Past Medical History:  Diagnosis Date  . Anemia   . Anxiety   . Blood transfusion without reported diagnosis    both CS  . Diabetes mellitus without complication ([place])   . GERD (gastroesophageal reflux disease)    has resolved  . IBS (irritable bowel syndrome)   . Ovarian cyst   . Peritonitis, acute generalized ([place])   . Pulmonary embolism ([place])   . Sepsis [facility])     Past Surgical History:  Procedure Laterality Date  . APPENDECTOMY     ruptured   . BUNIONECTOMY    . CESAREAN SECTION    . CESAREAN SECTION Bilateral 11/20/2017   Procedure: CESAREAN SECTION;  Surgeon: Sloan Leiter, MD;  Location: [place];  Service: Obstetrics;  Laterality: Bilateral;  . OVARIAN CYST DRAINAGE    . SMALL BOWEL REPAIR       Family History  Problem Relation Age of Onset  . Hypertension Mother   . Anemia Mother   . Diabetes Father     Social History   Tobacco Use  . Smoking status: Former Smoker    Packs/day: 0.50    Types: Cigarettes  . Smokeless tobacco: Never Used  Substance Use Topics  . Alcohol use: Yes  . Drug use: Yes    Allergies:  Allergies  Allergen Reactions  . Cetirizine & Related Itching and Swelling  . Toradol [Ketorolac Tromethamine] Hives and Itching  . Contrast Media [Iodinated Diagnostic Agents] Itching    Mild itching after IV contrast on 6/1/7 No urticaria visible No wheezing No angioedema  . Nsaids Rash    Medications Prior to Admission  Medication Sig Dispense Refill Last Dose  . acetaminophen (TYLENOL 8 HOUR) 650 MG CR tablet Take 1 tablet (650 mg total) by mouth every 8 (eight) hours as needed for pain. 30 tablet 0 02/15/2018 at Unknown time  . benzonatate (TESSALON) 100 MG capsule Take 1 capsule (100 mg total) by mouth 3 (three) times daily as needed for cough. 30 capsule 0 Not Taking at Unknown time  . enalapril (VASOTEC) 5 MG tablet Take 1 tablet (5 mg total) by mouth daily. 30 tablet 1 02/01/2018  . enoxaparin (LOVENOX) 120 MG/0.8ML injection Inject 0.72 mLs (110 mg total) into the skin every 12 (twelve) hours. 1 mL 1 02/01/2018  . metFORMIN (GLUCOPHAGE) 1000 MG tablet  Take 1 tablet (1,000 mg total) by mouth 2 (two) times daily with a meal. 60 tablet 3 02/15/2018 at Unknown time  . traMADol (ULTRAM) 50 MG tablet Take 1 tablet (50 mg total) by mouth every 6 (six) hours as needed. 60 tablet 0 Not Taking at Unknown time  . triamcinolone (KENALOG) 0.025 % ointment Apply 1 application topically daily as needed (eczema).   Past Week at Unknown time    Review of Systems  Respiratory: Negative for shortness of breath.   Gastrointestinal: Positive for abdominal pain. Negative for nausea and vomiting.       LLQ  Genitourinary: Negative for difficulty urinating,  dyspareunia, flank pain, vaginal bleeding, vaginal discharge and vaginal pain.  Neurological: Negative for headaches.  All other systems reviewed and are negative.  Physical Exam   Blood pressure (!) 149/89, pulse 89, temperature 98.1 F (36.7 C), resp. rate 16, height 5\' 6"  (1.676 m), weight 248 lb (112.5 kg), last menstrual period 04/02/2018, not currently breastfeeding.  Physical Exam  Nursing note and vitals reviewed. Constitutional: She is oriented to person, place, and time. She appears well-developed and well-nourished.  Cardiovascular: Normal rate, regular rhythm, normal heart sounds and intact distal pulses.  Respiratory: Effort normal and breath sounds normal.  GI: Soft. Bowel sounds are normal. There is tenderness.  Suprapubic tenderness on deep palpation  Genitourinary: Vagina normal and uterus normal.  Neurological: She is alert and oriented to person, place, and time. She has normal reflexes.  Skin: Skin is warm and dry.  Psychiatric: She has a normal mood and affect. Her behavior is normal. Judgment and thought content normal.    MAU Course  Procedures  MDM Patient Vitals for the past 24 hrs:  BP Temp Pulse Resp Height Weight  05/13/18 1415 - - - 18 - -  05/13/18 1221 (!) 149/89 98.1 F (36.7 C) 89 16 5\' 6"  (1.676 m) 248 lb (112.5 kg)    Orders Placed This Encounter  Procedures  . Wet prep, genital  . Urinalysis, Routine w reflex microscopic  . CBC  . Pregnancy, urine POC  . Discharge patient   Results for orders placed or performed during the hospital encounter of 05/13/18 (from the past 24 hour(s))  Urinalysis, Routine w reflex microscopic     Status: None   Collection Time: 05/13/18 12:26 PM  Result Value Ref Range   Color, Urine YELLOW YELLOW   APPearance CLEAR CLEAR   Specific Gravity, Urine 1.018 1.005 - 1.030   pH 7.0 5.0 - 8.0   Glucose, UA NEGATIVE NEGATIVE mg/dL   Hgb urine dipstick NEGATIVE NEGATIVE   Bilirubin Urine NEGATIVE NEGATIVE    Ketones, ur NEGATIVE NEGATIVE mg/dL   Protein, ur NEGATIVE NEGATIVE mg/dL   Nitrite NEGATIVE NEGATIVE   Leukocytes, UA NEGATIVE NEGATIVE  Pregnancy, urine POC     Status: None   Collection Time: 05/13/18 12:29 PM  Result Value Ref Range   Preg Test, Ur NEGATIVE NEGATIVE  Wet prep, genital     Status: Abnormal   Collection Time: 05/13/18  1:18 PM  Result Value Ref Range   Yeast Wet Prep HPF POC NONE SEEN NONE SEEN   Trich, Wet Prep NONE SEEN NONE SEEN   Clue Cells Wet Prep HPF POC PRESENT (A) NONE SEEN   WBC, Wet Prep HPF POC NONE SEEN NONE SEEN   Sperm NONE SEEN   CBC     Status: Abnormal   Collection Time: 05/13/18  2:04 PM  Result Value Ref  Range   WBC 4.4 4.0 - 10.5 K/uL   RBC 4.74 3.87 - 5.11 MIL/uL   Hemoglobin 11.5 (L) 12.0 - 15.0 g/dL   HCT 36.5 36.0 - 46.0 %   MCV 77.0 (L) 78.0 - 100.0 fL   MCH 24.3 (L) 26.0 - 34.0 pg   MCHC 31.5 30.0 - 36.0 g/dL   RDW 16.0 (H) 11.5 - 15.5 %   Platelets 293 150 - 400 K/uL    Assessment and Plan  --Patient left for work before end of triage, requesting rx for pain --bacterial vaginosis, rx to patient pharmacy --IUD visualized on SSE, reviewed expectations for bleeding with Liletta in place --Incomplete workup for abdominal pain due to patient's departure --Advised to pursue pain management, discussed conflict between MAU prescribed meds and pain     management contracts --Reviewed criteria for return to MAU including but not limited to pain not relieved by Tylenol, fever, vomiting. --Patient discharged home in stable condition  Darlina Rumpf, CNM 05/13/2018, 3:00 PM

## 2018-05-13 NOTE — Discharge Instructions (Signed)

## 2018-05-13 NOTE — MAU Note (Signed)
Pt presents to MAU with complaints of pain in her left lower abdomen for a couple of weeks. Denies any vaginal bleeding but reports pink watery discharge. IUD placed in March per pt

## 2018-05-14 LAB — GC/CHLAMYDIA PROBE AMP (~~LOC~~) NOT AT ARMC
Chlamydia: NEGATIVE
Neisseria Gonorrhea: NEGATIVE

## 2018-09-12 ENCOUNTER — Emergency Department (HOSPITAL_COMMUNITY)
Admission: EM | Admit: 2018-09-12 | Discharge: 2018-09-12 | Disposition: A | Discharge status: Home / Self Care | Payer: Medicaid Other | Attending: Emergency Medicine | Admitting: Emergency Medicine

## 2018-09-12 ENCOUNTER — Encounter (HOSPITAL_COMMUNITY): Payer: Self-pay

## 2018-09-12 ENCOUNTER — Other Ambulatory Visit: Payer: Self-pay

## 2018-09-12 DIAGNOSIS — H538 Other visual disturbances: Secondary | ICD-10-CM

## 2018-09-12 DIAGNOSIS — Z87891 Personal history of nicotine dependence: Secondary | ICD-10-CM | POA: Insufficient documentation

## 2018-09-12 DIAGNOSIS — R252 Cramp and spasm: Secondary | ICD-10-CM | POA: Insufficient documentation

## 2018-09-12 DIAGNOSIS — R5381 Other malaise: Secondary | ICD-10-CM | POA: Insufficient documentation

## 2018-09-12 DIAGNOSIS — E1165 Type 2 diabetes mellitus with hyperglycemia: Secondary | ICD-10-CM | POA: Insufficient documentation

## 2018-09-12 DIAGNOSIS — R631 Polydipsia: Secondary | ICD-10-CM | POA: Insufficient documentation

## 2018-09-12 DIAGNOSIS — R739 Hyperglycemia, unspecified: Secondary | ICD-10-CM

## 2018-09-12 DIAGNOSIS — O0932 Supervision of pregnancy with insufficient antenatal care, second trimester: Secondary | ICD-10-CM

## 2018-09-12 DIAGNOSIS — R358 Other polyuria: Secondary | ICD-10-CM | POA: Diagnosis not present

## 2018-09-12 LAB — BASIC METABOLIC PANEL
ANION GAP: 13 (ref 5–15)
BUN: 12 mg/dL (ref 6–20)
CALCIUM: 9.4 mg/dL (ref 8.9–10.3)
CO2: 22 mmol/L (ref 22–32)
Chloride: 95 mmol/L — ABNORMAL LOW (ref 98–111)
Creatinine, Ser: 0.87 mg/dL (ref 0.44–1.00)
GLUCOSE: 580 mg/dL — AB (ref 70–99)
POTASSIUM: 3.6 mmol/L (ref 3.5–5.1)
Sodium: 130 mmol/L — ABNORMAL LOW (ref 135–145)

## 2018-09-12 LAB — CBC WITH DIFFERENTIAL/PLATELET
Abs Immature Granulocytes: 0.02 K/uL (ref 0.00–0.07)
Basophils Absolute: 0 K/uL (ref 0.0–0.1)
Basophils Relative: 0 %
Eosinophils Absolute: 0.1 K/uL (ref 0.0–0.5)
Eosinophils Relative: 1 %
HCT: 40.6 % (ref 36.0–46.0)
Hemoglobin: 12.8 g/dL (ref 12.0–15.0)
Immature Granulocytes: 0 %
Lymphocytes Relative: 19 %
Lymphs Abs: 1 K/uL (ref 0.7–4.0)
MCH: 24.3 pg — ABNORMAL LOW (ref 26.0–34.0)
MCHC: 31.5 g/dL (ref 30.0–36.0)
MCV: 77.2 fL — ABNORMAL LOW (ref 80.0–100.0)
Monocytes Absolute: 0.4 K/uL (ref 0.1–1.0)
Monocytes Relative: 7 %
Neutro Abs: 3.7 K/uL (ref 1.7–7.7)
Neutrophils Relative %: 73 %
Platelets: 388 K/uL (ref 150–400)
RBC: 5.26 MIL/uL — ABNORMAL HIGH (ref 3.87–5.11)
RDW: 14.7 % (ref 11.5–15.5)
WBC: 5.1 K/uL (ref 4.0–10.5)
nRBC: 0 % (ref 0.0–0.2)

## 2018-09-12 LAB — CBG MONITORING, ED
GLUCOSE-CAPILLARY: 369 mg/dL — AB (ref 70–99)
GLUCOSE-CAPILLARY: 374 mg/dL — AB (ref 70–99)
Glucose-Capillary: 545 mg/dL (ref 70–99)
Glucose-Capillary: 341 mg/dL — ABNORMAL HIGH (ref 70–99)
Glucose-Capillary: 339 mg/dL — ABNORMAL HIGH (ref 70–99)

## 2018-09-12 LAB — I-STAT CHEM 8, ED
BUN: 14 mg/dL (ref 6–20)
Calcium, Ion: 1.08 mmol/L — ABNORMAL LOW (ref 1.15–1.40)
Chloride: 96 mmol/L — ABNORMAL LOW (ref 98–111)
Creatinine, Ser: 0.7 mg/dL (ref 0.44–1.00)
Glucose, Bld: 619 mg/dL (ref 70–99)
HCT: 43 % (ref 36.0–46.0)
Hemoglobin: 14.6 g/dL (ref 12.0–15.0)
Potassium: 3.7 mmol/L (ref 3.5–5.1)
Sodium: 131 mmol/L — ABNORMAL LOW (ref 135–145)
TCO2: 28 mmol/L (ref 22–32)

## 2018-09-12 LAB — URINALYSIS, ROUTINE W REFLEX MICROSCOPIC
BILIRUBIN URINE: NEGATIVE
KETONES UR: 5 mg/dL — AB
LEUKOCYTES UA: NEGATIVE
NITRITE: NEGATIVE
Protein, ur: NEGATIVE mg/dL
SPECIFIC GRAVITY, URINE: 1.031 — AB (ref 1.005–1.030)
pH: 5 (ref 5.0–8.0)

## 2018-09-12 LAB — I-STAT BETA HCG BLOOD, ED (MC, WL, AP ONLY): I-stat hCG, quantitative: <5 m[IU]/mL (ref <5)

## 2018-09-12 MED ORDER — METFORMIN HCL 500 MG PO TABS
1000.0000 mg | ORAL_TABLET | Freq: Once | ORAL | Status: AC
  Administered 2018-09-12: 1000 mg via ORAL
  Filled 2018-09-12: qty 2

## 2018-09-12 MED ORDER — INSULIN ASPART 100 UNIT/ML ~~LOC~~ SOLN
10.0000 [IU] | Freq: Once | SUBCUTANEOUS | Status: AC
  Administered 2018-09-12: 10 [IU] via INTRAVENOUS

## 2018-09-12 MED ORDER — METHOCARBAMOL 500 MG PO TABS
1000.0000 mg | ORAL_TABLET | Freq: Once | ORAL | Status: AC
  Administered 2018-09-12: 1000 mg via ORAL
  Filled 2018-09-12: qty 2

## 2018-09-12 MED ORDER — INSULIN ASPART 100 UNIT/ML ~~LOC~~ SOLN
5.0000 [IU] | Freq: Once | SUBCUTANEOUS | Status: AC
  Administered 2018-09-12: 5 [IU] via INTRAVENOUS

## 2018-09-12 MED ORDER — METHOCARBAMOL 500 MG PO TABS: 500.0000 mg | ORAL_TABLET | Freq: Two times a day (BID) | ORAL | 0 refills | Status: DC

## 2018-09-12 MED ORDER — ACETAMINOPHEN 325 MG PO TABS
650.0000 mg | ORAL_TABLET | Freq: Once | ORAL | Status: AC
  Administered 2018-09-12: 650 mg via ORAL
  Filled 2018-09-12: qty 2

## 2018-09-12 MED ORDER — METFORMIN HCL 1000 MG PO TABS: 1000.0000 mg | ORAL_TABLET | Freq: Two times a day (BID) | ORAL | 3 refills | Status: DC

## 2018-09-12 MED ORDER — SODIUM CHLORIDE 0.9 % IV BOLUS
2000.0000 mL | Freq: Once | INTRAVENOUS | Status: AC
  Administered 2018-09-12: 2000 mL via INTRAVENOUS

## 2018-09-12 MED ORDER — MORPHINE SULFATE (PF) 4 MG/ML IV SOLN
4.0000 mg | Freq: Once | INTRAVENOUS | Status: AC
  Administered 2018-09-12: 4 mg via INTRAVENOUS
  Filled 2018-09-12: qty 1

## 2018-09-12 MED ORDER — SODIUM CHLORIDE 0.9 % IV BOLUS
1000.0000 mL | Freq: Once | INTRAVENOUS | Status: AC
  Administered 2018-09-12: 1000 mL via INTRAVENOUS

## 2018-09-12 MED ORDER — SODIUM CHLORIDE 0.9 % IV BOLUS: 1000.0000 mL | Freq: Once | INTRAVENOUS | Status: DC

## 2018-09-12 NOTE — Discharge Instructions (Addendum)
Please start Metformin and take twice daily Follow diabetic diet. A print out has been included to help you make healthy food choices Please follow up with [facility]. Try to follow up within the next two weeks to have your blood sugar rechecked Return if you are worsening (you feel weak or lightheaded, vomiting, worsening leg cramps, passing out)

## 2018-09-12 NOTE — ED Provider Notes (Addendum)
[facility] EMERGENCY DEPARTMENT Provider Note   CSN: [id number] Arrival date & time: 09/12/18  6712    History   Chief Complaint Chief Complaint  Patient presents with  . Blurred Vision  . Spasms    HPI Jodi Mcgee is a 32 y.o. female who presents with blurry vision and muscle cramps. PMH significant for gestational diabetes, IBS, hx of PE, anemia.  The patient gave birth about 10 months ago and afterwards stopped taking her metformin.  She states her blood sugar was never over 150.  Over the past 3 days she has had constant blurry vision and has had difficult reading and driving.  She also reports generalized malaise and intense bilateral leg cramps which is intermittent but she still has soreness when her legs are not cramping.  She has associated polydipsia and polyuria.  She has chronic chest pain from when she had a PE and this is unchanged but she does have some SOB. She denies any abdominal pain.  She reports nausea but has not had any vomiting or diarrhea.  She denies any dysuria and just has frequency.  HPI  Past Medical History:  Diagnosis Date  . Anemia   . Anxiety   . Blood transfusion without reported diagnosis    both CS  . Diabetes mellitus without complication ([place])   . GERD (gastroesophageal reflux disease)    has resolved  . IBS (irritable bowel syndrome)   . Ovarian cyst   . Peritonitis, acute generalized ([place])   . Pulmonary embolism ([place])   . Sepsis [facility])     Patient Active Problem List   Diagnosis Date Noted  . History of cesarean section, classical 01/03/2018  . Chronic hypertension 01/03/2018  . IUD contraception 01/03/2018  . Postpartum care and examination 01/03/2018  . Pulmonary embolism during puerperium 12/04/2017  . Pulmonary edema 11/30/2017  . S/P cesarean section 11/21/2017  . Pelvic adhesive disease 10/22/2017  . Nasal septal perforation 10/17/2017  . History of cocaine abuse ([place]) 10/17/2017  . Epistaxis  10/17/2017  . 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 Obesity (BMI 30-39.9) 09/25/2017  . Hypertension in pregnancy, preeclampsia, severe, delivered/postpartum 09/13/2017  . Previous cesarean section complicating pregnancy 45/80/9983  . Type 2 diabetes mellitus, without long-term current use of insulin ([place]) 08/23/2017  . Chronic pain syndrome 08/23/2017    Past Surgical History:  Procedure Laterality Date  . APPENDECTOMY     ruptured   . BUNIONECTOMY    . CESAREAN SECTION    . CESAREAN SECTION Bilateral 11/20/2017   Procedure: CESAREAN SECTION;  Surgeon: Sloan Leiter, MD;  Location: [place];  Service: Obstetrics;  Laterality: Bilateral;  . OVARIAN CYST DRAINAGE    . SMALL BOWEL REPAIR       OB History    Gravida  3   Para  3   Term  1   Preterm  2   AB  0   Living  3     SAB  0   TAB  0   Ectopic  0   Multiple  0   Live Births  3            Home Medications    Prior to Admission medications   Medication Sig Start Date End Date Taking? Authorizing Provider  acetaminophen (TYLENOL 8 HOUR) 650 MG CR tablet Take 1 tablet (650 mg total) by mouth every 8 (eight) hours as needed for pain. 01/09/18   Petrucelli,  Samantha R, PA-C  benzonatate (TESSALON) 100 MG capsule Take 1 capsule (100 mg total) by mouth 3 (three) times daily as needed for cough. 01/09/18   Petrucelli, Samantha R, PA-C  enalapril (VASOTEC) 5 MG tablet Take 1 tablet (5 mg total) by mouth daily. 01/03/18   Chancy Milroy, MD  enoxaparin (LOVENOX) 120 MG/0.8ML injection Inject 0.72 mLs (110 mg total) into the skin every 12 (twelve) hours. 01/03/18   Chancy Milroy, MD  metFORMIN (GLUCOPHAGE) 1000 MG tablet Take 1 tablet (1,000 mg total) by mouth 2 (two) times daily with a meal. 08/23/17   Constant, Peggy, MD  metroNIDAZOLE (FLAGYL) 500 MG tablet Take 1 tablet (500 mg total) by mouth 2 (two) times daily. 05/13/18   Darlina Rumpf, CNM  triamcinolone (KENALOG) 0.025 % ointment  Apply 1 application topically daily as needed (eczema).    [provider]    Family History Family History  Problem Relation Age of Onset  . Hypertension Mother   . Anemia Mother   . Diabetes Father     Social History Social History   Tobacco Use  . Smoking status: Former Smoker    Packs/day: 0.50    Types: Cigarettes  . Smokeless tobacco: Never Used  Substance Use Topics  . Alcohol use: Not Currently  . Drug use: Not Currently     Allergies   Cetirizine & related; Toradol [ketorolac tromethamine]; Contrast media [iodinated diagnostic agents]; and Nsaids   Review of Systems Review of Systems  Constitutional: Negative for chills and fever.  Respiratory: Positive for cough. Negative for shortness of breath and wheezing.   Cardiovascular: Positive for chest pain (chronic per patient). Negative for palpitations and leg swelling.  Gastrointestinal: Positive for nausea. Negative for abdominal pain, diarrhea and vomiting.  Endocrine: Positive for polyuria.  Genitourinary: Positive for frequency. Negative for difficulty urinating, dysuria, flank pain and pelvic pain.  Neurological: Negative for syncope and light-headedness.  All other systems reviewed and are negative.    Physical Exam Updated Vital Signs BP 116/78   Pulse (!) 51   Temp 98.2 F (36.8 C) (Oral)   Resp 16   SpO2 100%   Physical Exam  Constitutional: She is oriented to person, place, and time. She appears well-developed and well-nourished. No distress.  Calm, cooperative. Obese  HENT:  Head: Normocephalic and atraumatic.  Eyes: Pupils are equal, round, and reactive to light. Conjunctivae are normal. Right eye exhibits no discharge. Left eye exhibits no discharge. No scleral icterus.  Neck: Normal range of motion.  Cardiovascular: Tachycardia present. Exam reveals no gallop and no friction rub.  No murmur heard. Pulmonary/Chest: Effort normal and breath sounds normal. No stridor. No  respiratory distress. She has no wheezes. She has no rales. She exhibits no tenderness.  Abdominal: Soft. Bowel sounds are normal. She exhibits no distension. There is no tenderness.  Musculoskeletal:  Diffuse calf tenderness bilaterally. No swelling or skin changes appreciated. 2+ DP pulse bilaterally  Neurological: She is alert and oriented to person, place, and time.  Skin: Skin is warm and dry.  Psychiatric: She has a normal mood and affect. Her behavior is normal.  Nursing note and vitals reviewed.    ED Treatments / Results  Labs (all labs ordered are listed, but only abnormal results are displayed) Labs Reviewed  BASIC METABOLIC PANEL - Abnormal; Notable for the following components:      Result Value   Sodium 130 (*)    Chloride 95 (*)  Glucose, Bld 580 (*)    All other components within normal limits  CBC WITH DIFFERENTIAL/PLATELET - Abnormal; Notable for the following components:   RBC 5.26 (*)    MCV 77.2 (*)    MCH 24.3 (*)    All other components within normal limits  URINALYSIS, ROUTINE W REFLEX MICROSCOPIC - Abnormal; Notable for the following components:   Specific Gravity, Urine 1.031 (*)    Glucose, UA >=500 (*)    Hgb urine dipstick MODERATE (*)    Ketones, ur 5 (*)    Bacteria, UA FEW (*)    All other components within normal limits  CBG MONITORING, ED - Abnormal; Notable for the following components:   Glucose-Capillary 545 (*)    All other components within normal limits  I-STAT CHEM 8, ED - Abnormal; Notable for the following components:   Sodium 131 (*)    Chloride 96 (*)    Glucose, Bld 619 (*)    Calcium, Ion 1.08 (*)    All other components within normal limits  CBG MONITORING, ED - Abnormal; Notable for the following components:   Glucose-Capillary 341 (*)    All other components within normal limits  CBG MONITORING, ED - Abnormal; Notable for the following components:   Glucose-Capillary 369 (*)    All other components within normal limits   CBG MONITORING, ED - Abnormal; Notable for the following components:   Glucose-Capillary 374 (*)    All other components within normal limits  CBG MONITORING, ED - Abnormal; Notable for the following components:   Glucose-Capillary 339 (*)    All other components within normal limits  I-STAT BETA HCG BLOOD, ED ([facility], [facility], [facility] ONLY)    EKG None  Radiology No results found.  Procedures Procedures (including critical care time)  Medications Ordered in ED Medications  sodium chloride 0.9 % bolus 2,000 mL (0 mLs Intravenous Stopped 09/12/18 1030)  insulin aspart (novoLOG) injection 10 Units (10 Units Intravenous Given 09/12/18 0751)  methocarbamol (ROBAXIN) tablet 1,000 mg (1,000 mg Oral Given 09/12/18 0755)  morphine 4 MG/ML injection 4 mg (4 mg Intravenous Given 09/12/18 0755)  sodium chloride 0.9 % bolus 1,000 mL (0 mLs Intravenous Stopped 09/12/18 1326)  insulin aspart (novoLOG) injection 5 Units (5 Units Intravenous Given 09/12/18 1202)  acetaminophen (TYLENOL) tablet 650 mg (650 mg Oral Given 09/12/18 1202)  metFORMIN (GLUCOPHAGE) tablet 1,000 mg (1,000 mg Oral Given 09/12/18 1315)  insulin aspart (novoLOG) injection 10 Units (10 Units Intravenous Given 09/12/18 1315)     Initial Impression / Assessment and Plan / ED Course  I have reviewed the triage vital signs and the nursing notes.  Pertinent labs & imaging results that were available during my care of the patient were reviewed by me and considered in my medical decision making (see chart for details).  32 year old female presents with blurry vision, polyuria, polydipsia, and muscle cramping. She is mildly tachycardic but otherwise vitals are normal. CBG at bedside is 545. Will initiate fluids, insulin, and provide pain control.   9:26 AM Rechecked pt. She is feeling somewhat better. Still having cramps and blurry vision. Repeat CBG is 341. CBC is normal. BMP is remarkable for hyponatremia (130), formal glucose is 580. Anion  gap and kidney function are normal. Discussed plan of care with patient. Will attempt to get her BG under 250.  11AM UA shows high specific gravity, >500 glucose, moderate hgb, 5 ketones, few bacteria. 3rd CBG is 369 but she ate some crackers.  Will give another fluid bolus and 5 units insulin.   1PM CBG is now 374. Will give her dose of Metformin and 10 more units insulin  2PM Informed by RN pt wants to leave. Repeat CBG is 339. She overall feels better but still has blurry vision. She is also eating a quarter pounder. I will give her an rx for Metformin 1000mg  BID and have her f/u with [facility]. Encouraged her to follow diabetic diet. She was advised to return to the ED if she is not feeling better in 2-3 days.      Final Clinical Impressions(s) / ED Diagnoses   Final diagnoses:  Hyperglycemia  Blurred vision, bilateral  Leg cramps    ED Discharge Orders    None       Recardo Evangelist, PA-C 09/13/18 1414    Recardo Evangelist, PA-C 09/13/18 1416    Charlesetta Shanks, MD 09/14/18 [phone]

## 2018-09-12 NOTE — ED Notes (Signed)
Patient left at this time with all belongings. 

## 2018-09-12 NOTE — ED Notes (Signed)
Notified patient of need for urine sample.

## 2018-09-12 NOTE — ED Triage Notes (Signed)
Pt c/o blurry vision and muscle cramps for 3 days. Pt had gestational diabetes while pregnant and stopped taking diabetes medication 2 months ago.

## 2018-09-13 ENCOUNTER — Encounter (HOSPITAL_COMMUNITY): Payer: Self-pay | Admitting: Emergency Medicine

## 2018-09-13 ENCOUNTER — Emergency Department (HOSPITAL_COMMUNITY)
Admission: EM | Admit: 2018-09-13 | Discharge: 2018-09-13 | Disposition: A | Discharge status: Home / Self Care | Payer: Medicaid Other | Attending: Emergency Medicine | Admitting: Emergency Medicine

## 2018-09-13 ENCOUNTER — Other Ambulatory Visit: Payer: Self-pay

## 2018-09-13 DIAGNOSIS — Z87891 Personal history of nicotine dependence: Secondary | ICD-10-CM | POA: Insufficient documentation

## 2018-09-13 DIAGNOSIS — H538 Other visual disturbances: Secondary | ICD-10-CM | POA: Insufficient documentation

## 2018-09-13 DIAGNOSIS — E1165 Type 2 diabetes mellitus with hyperglycemia: Secondary | ICD-10-CM | POA: Insufficient documentation

## 2018-09-13 DIAGNOSIS — R252 Cramp and spasm: Secondary | ICD-10-CM | POA: Insufficient documentation

## 2018-09-13 DIAGNOSIS — R739 Hyperglycemia, unspecified: Secondary | ICD-10-CM

## 2018-09-13 DIAGNOSIS — Z7984 Long term (current) use of oral hypoglycemic drugs: Secondary | ICD-10-CM | POA: Diagnosis not present

## 2018-09-13 LAB — CBC WITH DIFFERENTIAL/PLATELET
Abs Immature Granulocytes: 0.01 10*3/uL (ref 0.00–0.07)
Basophils Absolute: 0 10*3/uL (ref 0.0–0.1)
Basophils Relative: 0 %
Eosinophils Absolute: 0 10*3/uL (ref 0.0–0.5)
Eosinophils Relative: 1 %
HCT: 39 % (ref 36.0–46.0)
Hemoglobin: 11.9 g/dL — ABNORMAL LOW (ref 12.0–15.0)
Immature Granulocytes: 0 %
Lymphocytes Relative: 19 %
Lymphs Abs: 1 10*3/uL (ref 0.7–4.0)
MCH: 23.9 pg — ABNORMAL LOW (ref 26.0–34.0)
MCHC: 30.5 g/dL (ref 30.0–36.0)
MCV: 78.3 fL — ABNORMAL LOW (ref 80.0–100.0)
Monocytes Absolute: 0.2 10*3/uL (ref 0.1–1.0)
Monocytes Relative: 4 %
Neutro Abs: 3.9 10*3/uL (ref 1.7–7.7)
Neutrophils Relative %: 76 %
Platelets: 315 10*3/uL (ref 150–400)
RBC: 4.98 MIL/uL (ref 3.87–5.11)
RDW: 14.9 % (ref 11.5–15.5)
WBC: 5.2 10*3/uL (ref 4.0–10.5)
nRBC: 0 % (ref 0.0–0.2)

## 2018-09-13 LAB — URINALYSIS, ROUTINE W REFLEX MICROSCOPIC
Bilirubin Urine: NEGATIVE
Glucose, UA: >=500 mg/dL — AB
Hgb urine dipstick: NEGATIVE
Ketones, ur: 20 mg/dL — AB
LEUKOCYTES UA: NEGATIVE
Nitrite: NEGATIVE
PH: 6 (ref 5.0–8.0)
Protein, ur: NEGATIVE mg/dL
SPECIFIC GRAVITY, URINE: 1.027 (ref 1.005–1.030)

## 2018-09-13 LAB — COMPREHENSIVE METABOLIC PANEL
ALBUMIN: 3.2 g/dL — AB (ref 3.5–5.0)
ALK PHOS: 84 U/L (ref 38–126)
ALT: 19 U/L (ref 0–44)
AST: 16 U/L (ref 15–41)
Anion gap: 7 (ref 5–15)
BUN: 7 mg/dL (ref 6–20)
CALCIUM: 8.9 mg/dL (ref 8.9–10.3)
CO2: 24 mmol/L (ref 22–32)
CREATININE: 0.76 mg/dL (ref 0.44–1.00)
Chloride: 101 mmol/L (ref 98–111)
GFR calc Af Amer: >60 mL/min (ref >60)
GFR calc non Af Amer: >60 mL/min (ref >60)
GLUCOSE: 379 mg/dL — AB (ref 70–99)
Potassium: 3.7 mmol/L (ref 3.5–5.1)
SODIUM: 132 mmol/L — AB (ref 135–145)
TOTAL PROTEIN: 6.8 g/dL (ref 6.5–8.1)
Total Bilirubin: 0.7 mg/dL (ref 0.3–1.2)

## 2018-09-13 LAB — CBG MONITORING, ED
Glucose-Capillary: 402 mg/dL — ABNORMAL HIGH (ref 70–99)
Glucose-Capillary: 267 mg/dL — ABNORMAL HIGH (ref 70–99)

## 2018-09-13 LAB — POC URINE PREG, ED: Preg Test, Ur: NEGATIVE

## 2018-09-13 MED ORDER — SODIUM CHLORIDE 0.9 % IV BOLUS
1000.0000 mL | Freq: Once | INTRAVENOUS | Status: AC
  Administered 2018-09-13: 1000 mL via INTRAVENOUS

## 2018-09-13 MED ORDER — HYDROCODONE-ACETAMINOPHEN 5-325 MG PO TABS
2.0000 | ORAL_TABLET | Freq: Once | ORAL | Status: AC
  Administered 2018-09-13: 2 via ORAL
  Filled 2018-09-13 (×2): qty 2

## 2018-09-13 MED ORDER — INSULIN ASPART 100 UNIT/ML ~~LOC~~ SOLN
4.0000 [IU] | Freq: Once | SUBCUTANEOUS | Status: AC
  Administered 2018-09-13: 4 [IU] via INTRAVENOUS

## 2018-09-13 MED ORDER — ONDANSETRON HCL 4 MG/2ML IJ SOLN
4.0000 mg | Freq: Once | INTRAMUSCULAR | Status: AC
  Administered 2018-09-13: 4 mg via INTRAVENOUS
  Filled 2018-09-13: qty 2

## 2018-09-13 NOTE — ED Notes (Signed)
Pt sent to bathroom to provide urine sample

## 2018-09-13 NOTE — ED Triage Notes (Signed)
Pt presents to ED from home. Pt was seen here yesterday for same condition as well as high glucose. Pt went home with prescription for both. Pt presents back to hospital with complaints of blurry vision and muscle spasms in legs

## 2018-09-13 NOTE — Discharge Instructions (Signed)
As we discussed, take the Metformin as directed.  Also take the Robaxin to help with the cramping.  Follow-up with referred ophthalmologist for further evaluation.  Additionally, we discussed the importance of establishing a primary care doctor.  Please establish with [facility] for management of your blood sugar.  Return to emergency department for any chest pain, difficulty breathing, nausea/vomiting, abdominal pain or any other worsening or concerning symptoms.

## 2018-09-13 NOTE — ED Provider Notes (Signed)
[facility] EMERGENCY DEPARTMENT Provider Note   CSN: [id number] Arrival date & time: 09/13/18  1449     History   Chief Complaint Chief Complaint  Patient presents with  . Hyperglycemia  . Blurred Vision  . Spasms    HPI Jodi Mcgee is a 32 y.o. female past medical history of anemia, anxiety, diabetes, GERD, who presents for evaluation of hyperglycemia, blurred vision bilaterally, legs.  Patient was seen here in the ED for same symptoms yesterday.  Her work-up there showed hyperglycemia with blood sugar in the 500s.  No evidence of DKA.  Patient was able to be discharged home with metformin.  Patient reports that she went home and took the metformin but states that she is continued to have symptoms.  Patient again states that she feels like her blood sugar still elevated and came in for further evaluation today.  Patient states that this blurred vision has been ongoing for the last several days it is in both eyes.  Patient reports that she can see but states it is generalized body.  No vision loss.  Patient also reports she has had some generalized malaise, bilateral leg cramping.  She states she has not had any overlying warmth, erythema of the legs.  She has not noticed any edema of the legs.  Patient states that she also had a one episode of diarrhea last night after taking metformin.  Patient states that she was discharged home with follow-up with [facility] wellness [facility].  She does not have a primary care that she follows with.  His report that she had a history of gestational diabetes and was on metformin.  She reports that she thought her blood sugar was fine afterwards and did not take any of the medication.  Patient reports that she does have intermittent chronic chest pain from when she had a PE.  She is not currently having any chest pain or difficulty breathing.  She states that it is normal for her to have intermittent chest pain and states that this is not new  finding.  She, patient reports some tingling in the tips of her fingers.  Patient denies any slurred speech, abdominal pain, weakness of her extremities.  The history is provided by the patient.    Past Medical History:  Diagnosis Date  . Anemia   . Anxiety   . Blood transfusion without reported diagnosis    both CS  . Diabetes mellitus without complication ([place])   . GERD (gastroesophageal reflux disease)    has resolved  . IBS (irritable bowel syndrome)   . Ovarian cyst   . Peritonitis, acute generalized ([place])   . Pulmonary embolism ([place])   . Sepsis [facility])     Patient Active Problem List   Diagnosis Date Noted  . History of cesarean section, classical 01/03/2018  . Chronic hypertension 01/03/2018  . IUD contraception 01/03/2018  . Postpartum care and examination 01/03/2018  . Pulmonary embolism during puerperium 12/04/2017  . Pulmonary edema 11/30/2017  . S/P cesarean section 11/21/2017  . Pelvic adhesive disease 10/22/2017  . Nasal septal perforation 10/17/2017  . History of cocaine abuse ([place]) 10/17/2017  . Epistaxis 10/17/2017  . 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 Obesity (BMI 30-39.9) 09/25/2017  . Hypertension in pregnancy, preeclampsia, severe, delivered/postpartum 09/13/2017  . Previous cesarean section complicating pregnancy 69/48/5462  . Type 2 diabetes mellitus, without long-term current use of insulin ([place]) 08/23/2017  . Chronic pain syndrome 08/23/2017  Past Surgical History:  Procedure Laterality Date  . APPENDECTOMY     ruptured   . BUNIONECTOMY    . CESAREAN SECTION    . CESAREAN SECTION Bilateral 11/20/2017   Procedure: CESAREAN SECTION;  Surgeon: Sloan Leiter, MD;  Location: [facility];  Service: Obstetrics;  Laterality: Bilateral;  . OVARIAN CYST DRAINAGE    . SMALL BOWEL REPAIR       OB History    Gravida  3   Para  3   Term  1   Preterm  2   AB  0   Living  3     SAB  0   TAB  0   Ectopic  0   Multiple   0   Live Births  3            Home Medications    Prior to Admission medications   Medication Sig Start Date End Date Taking? Authorizing Provider  levonorgestrel (MIRENA) 20 MCG/24HR IUD 1 each by Intrauterine route once.    [provider]  metFORMIN (GLUCOPHAGE) 1000 MG tablet Take 1 tablet (1,000 mg total) by mouth 2 (two) times daily with a meal. 09/12/18   Ruthann Cancer, Jesse Fall, PA-C  methocarbamol (ROBAXIN) 500 MG tablet Take 1 tablet (500 mg total) by mouth 2 (two) times daily. 09/12/18   Recardo Evangelist, PA-C    Family History Family History  Problem Relation Age of Onset  . Hypertension Mother   . Anemia Mother   . Diabetes Father     Social History Social History   Tobacco Use  . Smoking status: Former Smoker    Packs/day: 0.50    Types: Cigarettes  . Smokeless tobacco: Never Used  Substance Use Topics  . Alcohol use: Not Currently  . Drug use: Not Currently     Allergies   Cetirizine & related; Toradol [ketorolac tromethamine]; Contrast media [iodinated diagnostic agents]; and Nsaids   Review of Systems Review of Systems  Constitutional: Negative for fever.       Malaise  Eyes: Positive for visual disturbance.  Respiratory: Negative for cough and shortness of breath.   Cardiovascular: Negative for chest pain and leg swelling.  Gastrointestinal: Positive for nausea. Negative for abdominal pain and vomiting.  Genitourinary: Negative for dysuria and hematuria.  Neurological: Positive for numbness (tingling). Negative for weakness and headaches.  All other systems reviewed and are negative.    Physical Exam Updated Vital Signs BP 123/80 (BP Location: Right Arm)   Pulse 92   Temp 98.9 F (37.2 C) (Oral)   Resp 16   Ht 5\' 7"  (1.702 m)   Wt 95.3 kg   SpO2 98%   BMI 32.89 kg/m   Physical Exam  Constitutional: She is oriented to person, place, and time. She appears well-developed and well-nourished.  HENT:  Head: Normocephalic  and atraumatic.  Mouth/Throat: Oropharynx is clear and moist and mucous membranes are normal.  Eyes: Pupils are equal, round, and reactive to light. Conjunctivae, EOM and lids are normal.  EOMs intact without any difficulty.  Patient cannot tell me how many fingers I can holding up with each eye that she does report some blurriness to the vision.  Limited funduscopic exam.  Neck: Full passive range of motion without pain.  Cardiovascular: Normal rate, regular rhythm, normal heart sounds and normal pulses. Exam reveals no gallop and no friction rub.  No murmur heard. Pulses:      Radial pulses are 2+ on  the right side, and 2+ on the left side.       Dorsalis pedis pulses are 2+ on the right side, and 2+ on the left side.  Pulmonary/Chest: Effort normal and breath sounds normal.  Lungs clear to auscultation bilaterally.  Symmetric chest rise.  No wheezing, rales, rhonchi.  Abdominal: Soft. Normal appearance. There is no tenderness. There is no rigidity and no guarding.  Musculoskeletal: Normal range of motion.  Lateral lower extremities are symmetric in appearance without any overlying warmth, erythema, edema.  Neurological: She is alert and oriented to person, place, and time.  Cranial nerves III-XII intact Follows commands, Moves all extremities  5/5 strength to BUE and BLE  Sensation intact throughout all major nerve distributions Normal finger to nose. No dysdiadochokinesia. No pronator drift. No gait abnormalities  No slurred speech. No facial droop.   Skin: Skin is warm and dry. Capillary refill takes less than 2 seconds.  Psychiatric: She has a normal mood and affect. Her speech is normal.  Nursing note and vitals reviewed.    ED Treatments / Results  Labs (all labs ordered are listed, but only abnormal results are displayed) Labs Reviewed  COMPREHENSIVE METABOLIC PANEL - Abnormal; Notable for the following components:      Result Value   Sodium 132 (*)    Glucose, Bld 379  (*)    Albumin 3.2 (*)    All other components within normal limits  CBC WITH DIFFERENTIAL/PLATELET - Abnormal; Notable for the following components:   Hemoglobin 11.9 (*)    MCV 78.3 (*)    MCH 23.9 (*)    All other components within normal limits  URINALYSIS, ROUTINE W REFLEX MICROSCOPIC - Abnormal; Notable for the following components:   Glucose, UA >=500 (*)    Ketones, ur 20 (*)    Bacteria, UA RARE (*)    All other components within normal limits  CBG MONITORING, ED - Abnormal; Notable for the following components:   Glucose-Capillary 402 (*)    All other components within normal limits  CBG MONITORING, ED - Abnormal; Notable for the following components:   Glucose-Capillary 267 (*)    All other components within normal limits  POC URINE PREG, ED    EKG None  Radiology No results found.  Procedures Procedures (including critical care time)  Medications Ordered in ED Medications  sodium chloride 0.9 % bolus 1,000 mL (0 mLs Intravenous Stopped 09/13/18 1820)  insulin aspart (novoLOG) injection 4 Units (4 Units Intravenous Given 09/13/18 1614)  ondansetron (ZOFRAN) injection 4 mg (4 mg Intravenous Given 09/13/18 1711)  HYDROcodone-acetaminophen (NORCO/VICODIN) 5-325 MG per tablet 2 tablet (2 tablets Oral Given 09/13/18 1824)     Initial Impression / Assessment and Plan / ED Course  I have reviewed the triage vital signs and the nursing notes.  Pertinent labs & imaging results that were available during my care of the patient were reviewed by me and considered in my medical decision making (see chart for details).     32 year old female who presents for evaluation of blurred vision, bilateral lower extremity cramps, hyperglycemia.  Was seen in the ED yesterday for same symptoms.  Patient's blood sugar that time was in the 500s.  She had a reassuring work-up and was discharged home with metformin.  She does report a history of gestational diabetes was on metformin but  states that she stopped taking it because she thought her blood sugar was fine.  She states that she went home and took  the Metformin last night but states she symptoms prompting her ED visit today.  She reports that she had a history of deep PE and reports chronic chest pain from that.  No changes or worsening pain.  No shortness of breath.  Patient states that the blurry vision is in both eyes.  She denies any acute vision changes.  She does report an episode of diarrhea after taking metformin last night.  No abdominal pain. Patient is afebrile , non-toxic appearing, sitting comfortably on examination table. Vital signs reviewed and stable.  On exam, she is able to tell me how many fingers I am holding up with the type of though she does report that the vision is blurry.  No other neuro deficits noted on exam.  Abdomen exam is benign.  I suspect that her blurry vision is related likely related to microvascular damage from her continued blood sugar.  Low suspicion for DKA at this time given reassuring history/physical exam.  We will plan to check basic labs.  Will give dose of insulin for elevated blood sugar.  History/physical exam is not concerning for CRVO, CRAO, CVA.   CBG is 402.  CMP shows normal potassium, bicarb is 24.  Normal anion gap.  CBC shows no significant leukocytosis or anemia.  Urine pregnancy negative.  UA shows small amount of ketones.  Given history/physical exam, do not suspect that this is DKA.  Repeat CBG after fluids, insulin shows it is improved into the 200s.  Patient is ambulating in the department any difficulty.  At this time, I feel that she is stable for discharge home with primary care follow-up.  Patient requesting that I start her on insulin to manage her high blood sugar.  I discussed with patient that that would not be reasonable given that we do not know what her A1c is.  Additionally, discussed with patient that treating her blood sugar is a variable component and that  since we do not know what her A1c is, no indication what her blood sugars have normally ran.  Encouraged her to continue taking the metformin and follow-up with referred [facility] wellness clinic to establish primary care.  Additionally, we will give her outpatient referral to ophthalmology for continued blurred vision. At this time, patient exhibits no emergent life-threatening condition that require further evaluation in ED. Patient had ample opportunity for questions and discussion. All patient's questions were answered with full understanding. Strict return precautions discussed. Patient expresses understanding and agreement to plan.   Final Clinical Impressions(s) / ED Diagnoses   Final diagnoses:  Hyperglycemia  Blurred vision, bilateral  Leg cramps    ED Discharge Orders    None       Desma Mcgregor 09/13/18 2348    Blanchie Dessert, MD 09/22/18 2103

## 2018-09-26 ENCOUNTER — Other Ambulatory Visit: Payer: Self-pay

## 2018-09-26 ENCOUNTER — Observation Stay (HOSPITAL_COMMUNITY)
Admission: EM | Admit: 2018-09-26 | Discharge: 2018-09-27 | Disposition: A | Discharge status: Home / Self Care | Payer: Medicaid Other | Attending: Internal Medicine | Admitting: Internal Medicine

## 2018-09-26 ENCOUNTER — Encounter (HOSPITAL_COMMUNITY): Payer: Self-pay

## 2018-09-26 DIAGNOSIS — Z975 Presence of (intrauterine) contraceptive device: Secondary | ICD-10-CM | POA: Diagnosis not present

## 2018-09-26 DIAGNOSIS — K589 Irritable bowel syndrome without diarrhea: Secondary | ICD-10-CM | POA: Diagnosis not present

## 2018-09-26 DIAGNOSIS — Z87891 Personal history of nicotine dependence: Secondary | ICD-10-CM | POA: Diagnosis not present

## 2018-09-26 DIAGNOSIS — K219 Gastro-esophageal reflux disease without esophagitis: Secondary | ICD-10-CM | POA: Insufficient documentation

## 2018-09-26 DIAGNOSIS — Z794 Long term (current) use of insulin: Secondary | ICD-10-CM | POA: Insufficient documentation

## 2018-09-26 DIAGNOSIS — G894 Chronic pain syndrome: Secondary | ICD-10-CM | POA: Insufficient documentation

## 2018-09-26 DIAGNOSIS — F419 Anxiety disorder, unspecified: Secondary | ICD-10-CM | POA: Insufficient documentation

## 2018-09-26 DIAGNOSIS — D649 Anemia, unspecified: Secondary | ICD-10-CM | POA: Diagnosis not present

## 2018-09-26 DIAGNOSIS — E1100 Type 2 diabetes mellitus with hyperosmolarity without nonketotic hyperglycemic-hyperosmolar coma (NKHHC): Principal | ICD-10-CM | POA: Diagnosis present

## 2018-09-26 DIAGNOSIS — R739 Hyperglycemia, unspecified: Secondary | ICD-10-CM | POA: Diagnosis not present

## 2018-09-26 DIAGNOSIS — Z9114 Patient's other noncompliance with medication regimen: Secondary | ICD-10-CM | POA: Insufficient documentation

## 2018-09-26 DIAGNOSIS — Z86711 Personal history of pulmonary embolism: Secondary | ICD-10-CM | POA: Insufficient documentation

## 2018-09-26 DIAGNOSIS — E871 Hypo-osmolality and hyponatremia: Secondary | ICD-10-CM | POA: Diagnosis not present

## 2018-09-26 DIAGNOSIS — E119 Type 2 diabetes mellitus without complications: Secondary | ICD-10-CM

## 2018-09-26 DIAGNOSIS — Z6839 Body mass index (BMI) 39.0-39.9, adult: Secondary | ICD-10-CM | POA: Insufficient documentation

## 2018-09-26 DIAGNOSIS — E669 Obesity, unspecified: Secondary | ICD-10-CM | POA: Insufficient documentation

## 2018-09-26 DIAGNOSIS — I10 Essential (primary) hypertension: Secondary | ICD-10-CM | POA: Insufficient documentation

## 2018-09-26 DIAGNOSIS — Z8249 Family history of ischemic heart disease and other diseases of the circulatory system: Secondary | ICD-10-CM | POA: Diagnosis not present

## 2018-09-26 LAB — BASIC METABOLIC PANEL
Anion gap: 12 (ref 5–15)
BUN: 18 mg/dL (ref 6–20)
CALCIUM: 7.2 mg/dL — AB (ref 8.9–10.3)
CHLORIDE: 100 mmol/L (ref 98–111)
CO2: 15 mmol/L — AB (ref 22–32)
CREATININE: 0.87 mg/dL (ref 0.44–1.00)
GFR calc non Af Amer: >60 mL/min (ref >60)
GLUCOSE: 662 mg/dL — AB (ref 70–99)
Potassium: 4.4 mmol/L (ref 3.5–5.1)
Sodium: 127 mmol/L — ABNORMAL LOW (ref 135–145)

## 2018-09-26 LAB — CBC WITH DIFFERENTIAL/PLATELET
ABS IMMATURE GRANULOCYTES: 0.05 10*3/uL (ref 0.00–0.07)
BASOS ABS: 0 10*3/uL (ref 0.0–0.1)
Basophils Relative: 1 %
EOS PCT: 1 %
Eosinophils Absolute: 0 10*3/uL (ref 0.0–0.5)
HEMATOCRIT: 42.9 % (ref 36.0–46.0)
HEMOGLOBIN: 13.3 g/dL (ref 12.0–15.0)
Immature Granulocytes: 1 %
LYMPHS ABS: 0.6 10*3/uL — AB (ref 0.7–4.0)
LYMPHS PCT: 9 %
MCH: 24.9 pg — ABNORMAL LOW (ref 26.0–34.0)
MCHC: 31 g/dL (ref 30.0–36.0)
MCV: 80.3 fL (ref 80.0–100.0)
Monocytes Absolute: 0.4 10*3/uL (ref 0.1–1.0)
Monocytes Relative: 6 %
NEUTROS ABS: 5.5 10*3/uL (ref 1.7–7.7)
Neutrophils Relative %: 82 %
Platelets: 390 10*3/uL (ref 150–400)
RBC: 5.34 MIL/uL — AB (ref 3.87–5.11)
RDW: 15.7 % — ABNORMAL HIGH (ref 11.5–15.5)
WBC: 6.6 10*3/uL (ref 4.0–10.5)
nRBC: 0 % (ref 0.0–0.2)

## 2018-09-26 LAB — I-STAT BETA HCG BLOOD, ED (MC, WL, AP ONLY): I-stat hCG, quantitative: <5 m[IU]/mL (ref <5)

## 2018-09-26 LAB — CBG MONITORING, ED
Glucose-Capillary: 571 mg/dL (ref 70–99)
Glucose-Capillary: 456 mg/dL — ABNORMAL HIGH (ref 70–99)
Glucose-Capillary: 433 mg/dL — ABNORMAL HIGH (ref 70–99)
Glucose-Capillary: 438 mg/dL — ABNORMAL HIGH (ref 70–99)
Glucose-Capillary: 407 mg/dL — ABNORMAL HIGH (ref 70–99)
Glucose-Capillary: 402 mg/dL — ABNORMAL HIGH (ref 70–99)

## 2018-09-26 LAB — HEMOGLOBIN A1C
Hgb A1c MFr Bld: 11 % — ABNORMAL HIGH (ref 4.8–5.6)
Mean Plasma Glucose: 269 mg/dL

## 2018-09-26 LAB — URINALYSIS, ROUTINE W REFLEX MICROSCOPIC
BILIRUBIN URINE: NEGATIVE
Bacteria, UA: NONE SEEN
Glucose, UA: >=500 mg/dL — AB
KETONES UR: 20 mg/dL — AB
LEUKOCYTES UA: NEGATIVE
NITRITE: NEGATIVE
PROTEIN: NEGATIVE mg/dL
SPECIFIC GRAVITY, URINE: 1.023 (ref 1.005–1.030)
pH: 5 (ref 5.0–8.0)

## 2018-09-26 LAB — GLUCOSE, CAPILLARY
GLUCOSE-CAPILLARY: 298 mg/dL — AB (ref 70–99)
Glucose-Capillary: 226 mg/dL — ABNORMAL HIGH (ref 70–99)
Glucose-Capillary: 253 mg/dL — ABNORMAL HIGH (ref 70–99)
Glucose-Capillary: 407 mg/dL — ABNORMAL HIGH (ref 70–99)

## 2018-09-26 MED ORDER — INSULIN REGULAR BOLUS VIA INFUSION
0.0000 [IU] | Freq: Three times a day (TID) | INTRAVENOUS | Status: DC
  Filled 2018-09-26: qty 10

## 2018-09-26 MED ORDER — METFORMIN HCL 500 MG PO TABS
1000.0000 mg | ORAL_TABLET | Freq: Two times a day (BID) | ORAL | Status: DC
  Administered 2018-09-27: 1000 mg via ORAL
  Filled 2018-09-26 (×2): qty 2

## 2018-09-26 MED ORDER — INSULIN REGULAR(HUMAN) IN NACL 100-0.9 UT/100ML-% IV SOLN
INTRAVENOUS | Status: DC
  Administered 2018-09-27: 6 [IU]/h via INTRAVENOUS
  Filled 2018-09-26: qty 100

## 2018-09-26 MED ORDER — SODIUM CHLORIDE 0.9 % IV SOLN
Freq: Once | INTRAVENOUS | Status: AC
  Administered 2018-09-26: 20:00:00 via INTRAVENOUS

## 2018-09-26 MED ORDER — POLYETHYLENE GLYCOL 3350 17 G PO PACK: 17.0000 g | PACK | Freq: Every day | ORAL | Status: DC | PRN

## 2018-09-26 MED ORDER — INSULIN ASPART 100 UNIT/ML IV SOLN
10.0000 [IU] | Freq: Once | INTRAVENOUS | Status: DC
  Filled 2018-09-26: qty 0.1

## 2018-09-26 MED ORDER — SODIUM CHLORIDE 0.9 % IV SOLN
INTRAVENOUS | Status: DC
  Administered 2018-09-26: 21:00:00 via INTRAVENOUS

## 2018-09-26 MED ORDER — SODIUM CHLORIDE 0.9 % IV BOLUS
1000.0000 mL | Freq: Once | INTRAVENOUS | Status: AC
  Administered 2018-09-26: 1000 mL via INTRAVENOUS

## 2018-09-26 MED ORDER — DEXTROSE-NACL 5-0.45 % IV SOLN: INTRAVENOUS | Status: DC

## 2018-09-26 MED ORDER — INSULIN REGULAR(HUMAN) IN NACL 100-0.9 UT/100ML-% IV SOLN
INTRAVENOUS | Status: DC
  Administered 2018-09-26: 3.8 [IU]/h via INTRAVENOUS
  Filled 2018-09-26: qty 100

## 2018-09-26 MED ORDER — MORPHINE SULFATE (PF) 2 MG/ML IV SOLN
2.0000 mg | Freq: Once | INTRAVENOUS | Status: AC
  Administered 2018-09-26: 2 mg via INTRAVENOUS
  Filled 2018-09-26: qty 1

## 2018-09-26 MED ORDER — LEVONORGESTREL 20 MCG/24HR IU IUD: 1.0000 | INTRAUTERINE_SYSTEM | Freq: Once | INTRAUTERINE | Status: DC

## 2018-09-26 MED ORDER — CYCLOBENZAPRINE HCL 5 MG PO TABS
5.0000 mg | ORAL_TABLET | Freq: Once | ORAL | Status: AC
  Administered 2018-09-26: 5 mg via ORAL
  Filled 2018-09-26: qty 1

## 2018-09-26 MED ORDER — ACETAMINOPHEN 500 MG PO TABS
1000.0000 mg | ORAL_TABLET | Freq: Once | ORAL | Status: AC
  Administered 2018-09-26: 1000 mg via ORAL
  Filled 2018-09-26: qty 2

## 2018-09-26 MED ORDER — ONDANSETRON HCL 4 MG/2ML IJ SOLN
4.0000 mg | Freq: Once | INTRAMUSCULAR | Status: AC
  Administered 2018-09-26: 4 mg via INTRAVENOUS
  Filled 2018-09-26: qty 2

## 2018-09-26 MED ORDER — DEXTROSE-NACL 5-0.45 % IV SOLN
INTRAVENOUS | Status: DC
  Administered 2018-09-27: via INTRAVENOUS

## 2018-09-26 MED ORDER — ONDANSETRON HCL 4 MG PO TABS: 4.0000 mg | ORAL_TABLET | Freq: Four times a day (QID) | ORAL | Status: DC | PRN

## 2018-09-26 MED ORDER — ONDANSETRON HCL 4 MG/2ML IJ SOLN: 4.0000 mg | Freq: Four times a day (QID) | INTRAMUSCULAR | Status: DC | PRN

## 2018-09-26 MED ORDER — INSULIN ASPART 100 UNIT/ML ~~LOC~~ SOLN
10.0000 [IU] | Freq: Once | SUBCUTANEOUS | Status: AC
  Administered 2018-09-26: 10 [IU] via SUBCUTANEOUS
  Filled 2018-09-26: qty 1

## 2018-09-26 MED ORDER — HEPARIN SODIUM (PORCINE) 5000 UNIT/ML IJ SOLN
5000.0000 [IU] | Freq: Three times a day (TID) | INTRAMUSCULAR | Status: DC
  Administered 2018-09-26 – 2018-09-27 (×2): 5000 [IU] via SUBCUTANEOUS
  Filled 2018-09-26 (×2): qty 1

## 2018-09-26 MED ORDER — DEXTROSE 50 % IV SOLN: 25.0000 mL | INTRAVENOUS | Status: DC | PRN

## 2018-09-26 NOTE — ED Notes (Signed)
Bed: OE32 Expected date:  Expected time:  Means of arrival:  Comments: EMS-hyperglycemia

## 2018-09-26 NOTE — ED Provider Notes (Signed)
[facility] DEPT Provider Note   CSN: [id number] Arrival date & time: 09/26/18  1033     History   Chief Complaint Chief Complaint  Patient presents with  . Hyperglycemia    HPI Jodi Mcgee is a 32 y.o. female.  32 year old female with prior medical history as detailed below presents for evaluation of hyperglycemia.  Patient reports polyuria, polydipsia, and fatigue.  She reports that her sugars have been elevated since last month.  She was seen at this facility for same complaint.  She was started on metformin at that time.  She has yet to follow-up with her regular primary care.  She denies associated chest pain, shortness breath, nausea, vomiting, or other acute complaint.  The history is provided by the patient and medical records.  Hyperglycemia  Severity:  Moderate Onset quality:  Gradual Timing:  Constant Progression:  Worsening Chronicity:  New Relieved by:  Nothing Ineffective treatments:  None tried   Past Medical History:  Diagnosis Date  . Anemia   . Anxiety   . Blood transfusion without reported diagnosis    both CS  . Diabetes mellitus without complication ([place])   . GERD (gastroesophageal reflux disease)    has resolved  . IBS (irritable bowel syndrome)   . Ovarian cyst   . Peritonitis, acute generalized ([place])   . Pulmonary embolism ([place])   . Sepsis [facility])     Patient Active Problem List   Diagnosis Date Noted  . Hyperglycemia 09/26/2018  . History of cesarean section, classical 01/03/2018  . Chronic hypertension 01/03/2018  . IUD contraception 01/03/2018  . Postpartum care and examination 01/03/2018  . Pulmonary embolism during puerperium 12/04/2017  . Pulmonary edema 11/30/2017  . S/P cesarean section 11/21/2017  . Pelvic adhesive disease 10/22/2017  . Nasal septal perforation 10/17/2017  . History of cocaine abuse ([place]) 10/17/2017  . Epistaxis 10/17/2017  . 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  Obesity (BMI 30-39.9) 09/25/2017  . Hypertension in pregnancy, preeclampsia, severe, delivered/postpartum 09/13/2017  . Previous cesarean section complicating pregnancy 66/59/9357  . Type 2 diabetes mellitus, without long-term current use of insulin ([place]) 08/23/2017  . Chronic pain syndrome 08/23/2017    Past Surgical History:  Procedure Laterality Date  . APPENDECTOMY     ruptured   . BUNIONECTOMY    . CESAREAN SECTION    . CESAREAN SECTION Bilateral 11/20/2017   Procedure: CESAREAN SECTION;  Surgeon: Sloan Leiter, MD;  Location: [place];  Service: Obstetrics;  Laterality: Bilateral;  . OVARIAN CYST DRAINAGE    . SMALL BOWEL REPAIR       OB History    Gravida  3   Para  3   Term  1   Preterm  2   AB  0   Living  3     SAB  0   TAB  0   Ectopic  0   Multiple  0   Live Births  3            Home Medications    Prior to Admission medications   Medication Sig Start Date End Date Taking? Authorizing Provider  levonorgestrel (MIRENA) 20 MCG/24HR IUD 1 each by Intrauterine route once.   Yes [provider]  metFORMIN (GLUCOPHAGE) 1000 MG tablet Take 1 tablet (1,000 mg total) by mouth 2 (two) times daily with a meal. 09/12/18  Yes Recardo Evangelist, PA-C  methocarbamol (ROBAXIN) 500 MG tablet Take 1 tablet (  500 mg total) by mouth 2 (two) times daily. Patient not taking: Reported on 09/26/2018 09/12/18   Recardo Evangelist, PA-C    Family History Family History  Problem Relation Age of Onset  . Hypertension Mother   . Anemia Mother   . Diabetes Father     Social History Social History   Tobacco Use  . Smoking status: Former Smoker    Packs/day: 0.50    Types: Cigarettes  . Smokeless tobacco: Never Used  Substance Use Topics  . Alcohol use: Not Currently  . Drug use: Not Currently     Allergies   Cetirizine & related; Toradol [ketorolac tromethamine]; Contrast media [iodinated diagnostic agents]; and Nsaids   Review of  Systems Review of Systems  All other systems reviewed and are negative.    Physical Exam Updated Vital Signs BP 121/78   Pulse 99   Temp 98 F (36.7 C)   Resp 16   SpO2 96%   Physical Exam  Constitutional: She is oriented to person, place, and time. She appears well-developed and well-nourished. No distress.  HENT:  Head: Normocephalic and atraumatic.  Mouth/Throat: Oropharynx is clear and moist.  Eyes: Pupils are equal, round, and reactive to light. Conjunctivae and EOM are normal.  Neck: Normal range of motion. Neck supple.  Cardiovascular: Normal rate, regular rhythm and normal heart sounds.  Pulmonary/Chest: Effort normal and breath sounds normal. No respiratory distress.  Abdominal: Soft. She exhibits no distension. There is no tenderness.  Musculoskeletal: Normal range of motion. She exhibits no edema or deformity.  Neurological: She is alert and oriented to person, place, and time.  Skin: Skin is warm and dry.  Psychiatric: She has a normal mood and affect.  Nursing note and vitals reviewed.    ED Treatments / Results  Labs (all labs ordered are listed, but only abnormal results are displayed) Labs Reviewed  CBC WITH DIFFERENTIAL/PLATELET - Abnormal; Notable for the following components:      Result Value   RBC 5.34 (*)    MCH 24.9 (*)    RDW 15.7 (*)    Lymphs Abs 0.6 (*)    All other components within normal limits  URINALYSIS, ROUTINE W REFLEX MICROSCOPIC - Abnormal; Notable for the following components:   Color, Urine COLORLESS (*)    Glucose, UA >=500 (*)    Hgb urine dipstick SMALL (*)    Ketones, ur 20 (*)    All other components within normal limits  BASIC METABOLIC PANEL - Abnormal; Notable for the following components:   Sodium 127 (*)    CO2 15 (*)    Glucose, Bld 662 (*)    Calcium 7.2 (*)    All other components within normal limits  CBG MONITORING, ED - Abnormal; Notable for the following components:   Glucose-Capillary >600 (*)    All  other components within normal limits  CBG MONITORING, ED - Abnormal; Notable for the following components:   Glucose-Capillary 571 (*)    All other components within normal limits  CBG MONITORING, ED - Abnormal; Notable for the following components:   Glucose-Capillary 456 (*)    All other components within normal limits  CBG MONITORING, ED - Abnormal; Notable for the following components:   Glucose-Capillary 433 (*)    All other components within normal limits  I-STAT BETA HCG BLOOD, ED ([facility], [facility], [facility] ONLY)    EKG None  Radiology No results found.  Procedures Procedures (including critical care time)  Medications Ordered in  ED Medications  sodium chloride 0.9 % bolus 1,000 mL (has no administration in time range)  sodium chloride 0.9 % bolus 1,000 mL (has no administration in time range)  0.9 %  sodium chloride infusion (has no administration in time range)  dextrose 5 %-0.45 % sodium chloride infusion (has no administration in time range)  insulin regular, human (MYXREDLIN) 100 units/ 100 mL infusion (has no administration in time range)  sodium chloride 0.9 % bolus 1,000 mL (0 mLs Intravenous Stopped 09/26/18 1308)  ondansetron (ZOFRAN) injection 4 mg (4 mg Intravenous Given 09/26/18 1130)  acetaminophen (TYLENOL) tablet 1,000 mg (1,000 mg Oral Given 09/26/18 1129)  insulin aspart (novoLOG) injection 10 Units (10 Units Subcutaneous Given 09/26/18 1310)  sodium chloride 0.9 % bolus 1,000 mL (1,000 mLs Intravenous New Bag/Given 09/26/18 1308)  morphine 2 MG/ML injection 2 mg (2 mg Intravenous Given 09/26/18 1319)  ondansetron (ZOFRAN) injection 4 mg (4 mg Intravenous Given 09/26/18 1330)     Initial Impression / Assessment and Plan / ED Course  I have reviewed the triage vital signs and the nursing notes.  Pertinent labs & imaging results that were available during my care of the patient were reviewed by me and considered in my medical decision making (see chart for details).      MDM  Screen complete  Patient is presenting for evaluation of elevated sugars.  Patient with persistent hyperglycemia over the last several visits to the ED.  Patient without evidence of DKA on exam.  Patient's sugars have been improved with IV fluids and insulin.  Patient would likely benefit from overnight admission for IV fluids and insulin.  Additionally, patient requires diabetic education.  Hospital service is aware of case and will evaluate for admission.  Final Clinical Impressions(s) / ED Diagnoses   Final diagnoses:  Hyperglycemia    ED Discharge Orders    None       Valarie Merino, MD 09/26/18 [phone]

## 2018-09-26 NOTE — ED Triage Notes (Signed)
Transported from home by [facility]-- reports leg cramps for 2 weeks. Pt recently diagnosed with DM, EMS reports that CBG read HI > 600 mg/dl. +n/v, polyuria. Pt reports compliance with Metformin. 500 cc of NS and 4 mg of Zofran administered IV PTA. IV in L AC.

## 2018-09-26 NOTE — ED Notes (Signed)
ED TO INPATIENT HANDOFF REPORT  Name/Age/Gender Jodi Mcgee 32 y.o. female  Code Status Code Status History    Date Active Date Inactive Code Status Order ID Comments User Context   11/30/2017 1851 12/02/2017 2151 Full Code [id number]  Tresea Mall, CNM Inpatient   11/21/2017 0008 11/23/2017 1717 Full Code [id number]  Osborne Oman, MD Inpatient   11/16/2017 0933 11/17/2017 1222 Full Code [id number]  Aletha Halim, MD Inpatient   11/05/2017 2257 11/08/2017 1708 Full Code [id number]  [name], MD Inpatient      Home/SNF/Other Home  Chief Complaint Hyperglycemia  Level of Care/Admitting Diagnosis ED Disposition    ED Disposition Condition [facility] Area: [place] [[id number]]  Level of Care: Stepdown [14]  Admit to SDU based on following criteria: Hemodynamic compromise or significant risk of instability:  Patient requiring short term acute titration and management of vasoactive drips, and invasive monitoring (i.e., CVP and Arterial line).  Diagnosis: Uncontrolled type 2 diabetes mellitus with hyperosmolar nonketotic hyperglycemia ([place]) [343568]  Admitting Physician: Charlynne Cousins [[id number]]  Attending Physician: Charlynne Cousins [[id number]]  PT Class (Do Not Modify): Observation [104]  PT Acc Code (Do Not Modify): Observation [10022]       Medical History Past Medical History:  Diagnosis Date  . Anemia   . Anxiety   . Blood transfusion without reported diagnosis    both CS  . Diabetes mellitus without complication ([place])   . GERD (gastroesophageal reflux disease)    has resolved  . IBS (irritable bowel syndrome)   . Ovarian cyst   . Peritonitis, acute generalized ([place])   . Pulmonary embolism ([place])   . Sepsis ([place])     Allergies Allergies  Allergen Reactions  . Cetirizine & Related Itching and Swelling  . Toradol [Ketorolac Tromethamine] Hives and Itching  . Contrast Media [Iodinated Diagnostic Agents] Itching   Mild itching after IV contrast on 6/1/7 No urticaria visible No wheezing No angioedema  . Nsaids Rash    IV Location/Drains/Wounds Patient Lines/Drains/Airways Status   Active Line/Drains/Airways    Name:   Placement date:   Placement time:   Site:   Days:   Peripheral IV 09/26/18 Left Antecubital   09/26/18    1121    Antecubital   less than 1          Labs/Imaging Results for orders placed or performed during the hospital encounter of 09/26/18 (from the past 48 hour(s))  CBC with Differential     Status: Abnormal   Collection Time: 09/26/18 11:19 AM  Result Value Ref Range   WBC 6.6 4.0 - 10.5 K/uL   RBC 5.34 (H) 3.87 - 5.11 MIL/uL   Hemoglobin 13.3 12.0 - 15.0 g/dL   HCT 42.9 36.0 - 46.0 %   MCV 80.3 80.0 - 100.0 fL   MCH 24.9 (L) 26.0 - 34.0 pg   MCHC 31.0 30.0 - 36.0 g/dL   RDW 15.7 (H) 11.5 - 15.5 %   Platelets 390 150 - 400 K/uL   nRBC 0.0 0.0 - 0.2 %   Neutrophils Relative % 82 %   Neutro Abs 5.5 1.7 - 7.7 K/uL   Lymphocytes Relative 9 %   Lymphs Abs 0.6 (L) 0.7 - 4.0 K/uL   Monocytes Relative 6 %   Monocytes Absolute 0.4 0.1 - 1.0 K/uL   Eosinophils Relative 1 %   Eosinophils Absolute 0.0 0.0 - 0.5 K/uL   Basophils Relative 1 %  Basophils Absolute 0.0 0.0 - 0.1 K/uL   Immature Granulocytes 1 %   Abs Immature Granulocytes 0.05 0.00 - 0.07 K/uL    Comment: Performed at [facility], [place] [address] [place], [place] [address]  Urinalysis, Routine w reflex microscopic     Status: Abnormal   Collection Time: 09/26/18 11:19 AM  Result Value Ref Range   Color, Urine COLORLESS (A) YELLOW   APPearance CLEAR CLEAR   Specific Gravity, Urine 1.023 1.005 - 1.030   pH 5.0 5.0 - 8.0   Glucose, UA >=500 (A) NEGATIVE mg/dL   Hgb urine dipstick SMALL (A) NEGATIVE   Bilirubin Urine NEGATIVE NEGATIVE   Ketones, ur 20 (A) NEGATIVE mg/dL   Protein, ur NEGATIVE NEGATIVE mg/dL   Nitrite NEGATIVE NEGATIVE   Leukocytes, UA NEGATIVE NEGATIVE   RBC / HPF  0-5 0 - 5 RBC/hpf   WBC, UA 0-5 0 - 5 WBC/hpf   Bacteria, UA NONE SEEN NONE SEEN   Squamous Epithelial / LPF 0-5 0 - 5    Comment: Performed at [facility], [place] [address], [place], [place] [address]  I-Stat beta hCG blood, ED     Status: None   Collection Time: 09/26/18 11:26 AM  Result Value Ref Range   I-stat hCG, quantitative <5.0 <5 mIU/mL   Comment 3            Comment:   GEST. AGE      CONC.  (mIU/mL)   <=1 WEEK        5 - 50     2 WEEKS       50 - 500     3 WEEKS       100 - 10,000     4 WEEKS     1,000 - 30,000        FEMALE AND NON-PREGNANT FEMALE:     LESS THAN 5 mIU/mL   CBG monitoring, ED     Status: Abnormal   Collection Time: 09/26/18 11:52 AM  Result Value Ref Range   Glucose-Capillary >600 (HH) 70 - 99 mg/dL  Basic metabolic panel     Status: Abnormal   Collection Time: 09/26/18 12:04 PM  Result Value Ref Range   Sodium 127 (L) 135 - 145 mmol/L   Potassium 4.4 3.5 - 5.1 mmol/L   Chloride 100 98 - 111 mmol/L   CO2 15 (L) 22 - 32 mmol/L   Glucose, Bld 662 (HH) 70 - 99 mg/dL    Comment: CRITICAL RESULT CALLED TO, READ BACK BY AND VERIFIED WITH: WEST,S. RN AT 1248 09/26/18 MULLINS,T    BUN 18 6 - 20 mg/dL   Creatinine, Ser 0.87 0.44 - 1.00 mg/dL   Calcium 7.2 (L) 8.9 - 10.3 mg/dL   GFR calc non Af Amer >60 >60 mL/min   GFR calc Af Amer >60 >60 mL/min   Anion gap 12 5 - 15    Comment: Performed at [facility], [place] [address], [place], [place] [address]  CBG monitoring, ED     Status: Abnormal   Collection Time: 09/26/18  1:35 PM  Result Value Ref Range   Glucose-Capillary 571 (HH) 70 - 99 mg/dL   Comment 1 Notify RN   CBG monitoring, ED     Status: Abnormal   Collection Time: 09/26/18  2:46 PM  Result Value Ref Range   Glucose-Capillary 456 (H) 70 - 99 mg/dL  CBG monitoring, ED     Status:  Abnormal   Collection Time: 09/26/18  3:49 PM  Result Value Ref Range   Glucose-Capillary 433 (H) 70 - 99 mg/dL  CBG  monitoring, ED     Status: Abnormal   Collection Time: 09/26/18  4:34 PM  Result Value Ref Range   Glucose-Capillary 438 (H) 70 - 99 mg/dL  CBG monitoring, ED     Status: Abnormal   Collection Time: 09/26/18  5:34 PM  Result Value Ref Range   Glucose-Capillary 407 (H) 70 - 99 mg/dL   No results found. None  Pending Labs Unresulted Labs (From admission, onward)    Start     Ordered   09/26/18 1559  Hemoglobin A1c  Once,   R     09/26/18 1558   Signed and Held  HIV antibody (Routine Testing)  Once,   R     Signed and Held   Signed and Held  CBC  (heparin)  Once,   R    Comments:  Baseline for heparin therapy IF NOT ALREADY DRAWN.  Notify MD if PLT < 100 K.    Signed and Held   Signed and Held  Creatinine, serum  (heparin)  Once,   R    Comments:  Baseline for heparin therapy IF NOT ALREADY DRAWN.    Signed and Held   Signed and Held  Comprehensive metabolic panel  Tomorrow morning,   R     Signed and Held   Signed and Held  CBC  Tomorrow morning,   R     Signed and Held          Vitals/Pain Today's Vitals   09/26/18 1321 09/26/18 1430 09/26/18 1604 09/26/18 1800  BP: 121/84 121/78 (!) 156/116 118/61  Pulse: 97 99 (!) 102 96  Resp: 16 16 16 15   Temp:      SpO2: 99% 96% 95% 100%  PainSc:        Isolation Precautions No active isolations  Medications Medications  0.9 %  sodium chloride infusion (has no administration in time range)  dextrose 5 %-0.45 % sodium chloride infusion (has no administration in time range)  insulin regular, human (MYXREDLIN) 100 units/ 100 mL infusion (3.5 Units/hr Intravenous Rate/Dose Change 09/26/18 1739)  sodium chloride 0.9 % bolus 1,000 mL (0 mLs Intravenous Stopped 09/26/18 1308)  ondansetron (ZOFRAN) injection 4 mg (4 mg Intravenous Given 09/26/18 1130)  acetaminophen (TYLENOL) tablet 1,000 mg (1,000 mg Oral Given 09/26/18 1129)  insulin aspart (novoLOG) injection 10 Units (10 Units Subcutaneous Given 09/26/18 1310)  sodium chloride  0.9 % bolus 1,000 mL (0 mLs Intravenous Stopped 09/26/18 1626)  morphine 2 MG/ML injection 2 mg (2 mg Intravenous Given 09/26/18 1319)  ondansetron (ZOFRAN) injection 4 mg (4 mg Intravenous Given 09/26/18 1330)  sodium chloride 0.9 % bolus 1,000 mL (1,000 mLs Intravenous New Bag/Given 09/26/18 1559)  sodium chloride 0.9 % bolus 1,000 mL (1,000 mLs Intravenous New Bag/Given 09/26/18 1559)    Mobility walks

## 2018-09-26 NOTE — ED Notes (Signed)
RN notified of abnormal lab 

## 2018-09-26 NOTE — H&P (Signed)
History and Physical  Jodi Mcgee GGY:[id number] DOB: [birth year] DOA: 09/26/2018  PCP: Wilber Oliphant, MD Patient coming from: Home  I have personally briefly reviewed patient's old medical records in [place]   Chief Complaint: Polyuria and polydipsia  HPI: Jodi Mcgee is a 32 y.o. female past medical history of diabetes mellitus, noncompliant with her medication that comes in with polyuria polydipsia and fatigue for the last month she relates her sugars been elevated at home she she was started on metformin about a month ago by her regular primary care doctor, but she really despite this her sugar continues to be high.  In the ED: Blood pressure has remained stable, she continues to be tachycardic, Her anion gap is borderline at 14 with a blood glucose of 600, pseudohyponatremia and no leukocytosis    Review of Systems: All systems reviewed and apart from history of presenting illness, are negative.  Past Medical History:  Diagnosis Date  . Anemia   . Anxiety   . Blood transfusion without reported diagnosis    both CS  . Diabetes mellitus without complication ([place])   . GERD (gastroesophageal reflux disease)    has resolved  . IBS (irritable bowel syndrome)   . Ovarian cyst   . Peritonitis, acute generalized ([place])   . Pulmonary embolism ([place])   . Sepsis [facility])    Past Surgical History:  Procedure Laterality Date  . APPENDECTOMY     ruptured   . BUNIONECTOMY    . CESAREAN SECTION    . CESAREAN SECTION Bilateral 11/20/2017   Procedure: CESAREAN SECTION;  Surgeon: Sloan Leiter, MD;  Location: [facility];  Service: Obstetrics;  Laterality: Bilateral;  . OVARIAN CYST DRAINAGE    . SMALL BOWEL REPAIR     Social History:  reports that she has quit smoking. Her smoking use included cigarettes. She smoked 0.50 packs per day. She has never used smokeless tobacco. She reports that she drank alcohol. She reports that she has current or past drug  history.   Allergies  Allergen Reactions  . Cetirizine & Related Itching and Swelling  . Toradol [Ketorolac Tromethamine] Hives and Itching  . Contrast Media [Iodinated Diagnostic Agents] Itching    Mild itching after IV contrast on 6/1/7 No urticaria visible No wheezing No angioedema  . Nsaids Rash    Family History  Problem Relation Age of Onset  . Hypertension Mother   . Anemia Mother   . Diabetes Father     Prior to Admission medications   Medication Sig Start Date End Date Taking? Authorizing Provider  levonorgestrel (MIRENA) 20 MCG/24HR IUD 1 each by Intrauterine route once.   Yes [provider]  metFORMIN (GLUCOPHAGE) 1000 MG tablet Take 1 tablet (1,000 mg total) by mouth 2 (two) times daily with a meal. 09/12/18  Yes Recardo Evangelist, PA-C  methocarbamol (ROBAXIN) 500 MG tablet Take 1 tablet (500 mg total) by mouth 2 (two) times daily. Patient not taking: Reported on 09/26/2018 09/12/18   Recardo Evangelist, PA-C   Physical Exam: Vitals:   09/26/18 1145 09/26/18 1321 09/26/18 1430 09/26/18 1604  BP: 114/60 121/84 121/78 (!) 156/116  Pulse: 87 97 99 (!) 102  Resp: 15 16 16 16   Temp:      SpO2: 100% 99% 96% 95%     General exam: Obese female, lying comfortably supine on the gurney in no obvious distress.  Head, eyes and ENT: Nontraumatic and normocephalic. Pupils equally  reacting to light and accommodation.  Dry mucous membranes  Neck: Supple. No JVD, carotid bruit or thyromegaly.  Lymphatics: No lymphadenopathy.  Respiratory system: Clear to auscultation. No increased work of breathing.  Cardiovascular system: S1 and S2 heard, RRR.   Gastrointestinal system: Abdomen is nondistended, soft and nontender. Normal bowel sounds heard. No organomegaly or masses appreciated.  Central nervous system: Alert and oriented. No focal neurological deficits.  Extremities: Symmetric 5 x 5 power. Peripheral pulses symmetrically felt.   Skin: Scaly dry  skin  Musculoskeletal system: Negative exam.  Psychiatry: Pleasant and cooperative.   Labs on Admission:  Basic Metabolic Panel: Recent Labs  Lab 09/26/18 1204  NA 127*  K 4.4  CL 100  CO2 15*  GLUCOSE 662*  BUN 18  CREATININE 0.87  CALCIUM 7.2*   Liver Function Tests: No results for input(s): AST, ALT, ALKPHOS, BILITOT, PROT, ALBUMIN in the last 168 hours. No results for input(s): LIPASE, AMYLASE in the last 168 hours. No results for input(s): AMMONIA in the last 168 hours. CBC: Recent Labs  Lab 09/26/18 1119  WBC 6.6  NEUTROABS 5.5  HGB 13.3  HCT 42.9  MCV 80.3  PLT 390   Cardiac Enzymes: No results for input(s): CKTOTAL, CKMB, CKMBINDEX, TROPONINI in the last 168 hours.  BNP (last 3 results) No results for input(s): PROBNP in the last 8760 hours. CBG: Recent Labs  Lab 09/26/18 1152 09/26/18 1335 09/26/18 1446 09/26/18 1549  GLUCAP >600* 571* 456* 433*    Radiological Exams on Admission: No results found.  EKG: Independently reviewed.  None  Assessment/Plan Active Problems:   Type 2 diabetes mellitus, without long-term current use of insulin (HCC)   Uncontrolled type 2 DM with hyperosmolar nonketotic hyperglycemia (HCC)   Hyponatremia I would admit her to stepdown under observation, will start her on IV insulin, and start her on the glucose stabilizer protocol as a blood glucose of 600, hydrate her aggressively, with normal saline with serial blood sugar reaches 250 can transition her to half-normal saline with D5. Sugars 200 can start oral metformin and glipizide. Once her blood glucose is 200 we could also stop IV insulin and give her the oral glipizide. Continue aggressive IV fluid hydration monitor strict I's and O's monitor potassium replete as needed. She likely has pseudohyponatremia, 2 to her significant elevated blood glucose.  This should correct as her blood glucose corrects.    DVT Prophylaxis: lovenxo Code Status: full  Family  Communication: none  Disposition Plan: observation home in am    Charlynne Cousins MD [facility]   If 7PM-7AM, please contact night-coverage [email or web address] Password [email or web address]  09/26/2018, 4:11 PM

## 2018-09-27 DIAGNOSIS — E1100 Type 2 diabetes mellitus with hyperosmolarity without nonketotic hyperglycemic-hyperosmolar coma (NKHHC): Secondary | ICD-10-CM | POA: Diagnosis not present

## 2018-09-27 DIAGNOSIS — E871 Hypo-osmolality and hyponatremia: Secondary | ICD-10-CM | POA: Diagnosis not present

## 2018-09-27 DIAGNOSIS — R739 Hyperglycemia, unspecified: Secondary | ICD-10-CM | POA: Diagnosis not present

## 2018-09-27 LAB — CBC
HCT: 38.5 % (ref 36.0–46.0)
HEMOGLOBIN: 11.6 g/dL — AB (ref 12.0–15.0)
MCH: 24.7 pg — ABNORMAL LOW (ref 26.0–34.0)
MCHC: 30.1 g/dL (ref 30.0–36.0)
MCV: 82.1 fL (ref 80.0–100.0)
Platelets: 184 10*3/uL (ref 150–400)
RBC: 4.69 MIL/uL (ref 3.87–5.11)
RDW: 15.7 % — ABNORMAL HIGH (ref 11.5–15.5)
WBC: 3.7 10*3/uL — ABNORMAL LOW (ref 4.0–10.5)
nRBC: 0 % (ref 0.0–0.2)

## 2018-09-27 LAB — COMPREHENSIVE METABOLIC PANEL
ALT: 21 U/L (ref 0–44)
AST: 15 U/L (ref 15–41)
Albumin: 3.2 g/dL — ABNORMAL LOW (ref 3.5–5.0)
Alkaline Phosphatase: 66 U/L (ref 38–126)
Anion gap: 7 (ref 5–15)
BUN: 16 mg/dL (ref 6–20)
CO2: 20 mmol/L — AB (ref 22–32)
Calcium: 8.4 mg/dL — ABNORMAL LOW (ref 8.9–10.3)
Chloride: 109 mmol/L (ref 98–111)
Creatinine, Ser: 0.68 mg/dL (ref 0.44–1.00)
GFR calc Af Amer: >60 mL/min (ref >60)
GFR calc non Af Amer: >60 mL/min (ref >60)
Glucose, Bld: 175 mg/dL — ABNORMAL HIGH (ref 70–99)
Potassium: 3.7 mmol/L (ref 3.5–5.1)
Sodium: 136 mmol/L (ref 135–145)
Total Bilirubin: 0.7 mg/dL (ref 0.3–1.2)
Total Protein: 6.3 g/dL — ABNORMAL LOW (ref 6.5–8.1)

## 2018-09-27 LAB — GLUCOSE, CAPILLARY
Glucose-Capillary: 180 mg/dL — ABNORMAL HIGH (ref 70–99)
Glucose-Capillary: 212 mg/dL — ABNORMAL HIGH (ref 70–99)
Glucose-Capillary: 211 mg/dL — ABNORMAL HIGH (ref 70–99)
Glucose-Capillary: 107 mg/dL — ABNORMAL HIGH (ref 70–99)
Glucose-Capillary: 112 mg/dL — ABNORMAL HIGH (ref 70–99)
Glucose-Capillary: 153 mg/dL — ABNORMAL HIGH (ref 70–99)
Glucose-Capillary: 167 mg/dL — ABNORMAL HIGH (ref 70–99)
Glucose-Capillary: 225 mg/dL — ABNORMAL HIGH (ref 70–99)
Glucose-Capillary: 212 mg/dL — ABNORMAL HIGH (ref 70–99)

## 2018-09-27 LAB — MRSA PCR SCREENING: MRSA by PCR: NEGATIVE

## 2018-09-27 LAB — HIV ANTIBODY (ROUTINE TESTING W REFLEX): HIV Screen 4th Generation wRfx: NONREACTIVE

## 2018-09-27 MED ORDER — LIVING WELL WITH DIABETES BOOK
Freq: Once | Status: AC
  Administered 2018-09-27: 12:00:00
  Filled 2018-09-27: qty 1

## 2018-09-27 MED ORDER — INSULIN DETEMIR 100 UNIT/ML ~~LOC~~ SOLN
20.0000 [IU] | Freq: Every day | SUBCUTANEOUS | Status: DC
  Administered 2018-09-27: 20 [IU] via SUBCUTANEOUS
  Filled 2018-09-27: qty 0.2

## 2018-09-27 MED ORDER — INSULIN DETEMIR 100 UNIT/ML FLEXPEN: 20.0000 [IU] | Freq: Every day | SUBCUTANEOUS | 0 refills | Status: DC

## 2018-09-27 MED ORDER — GLIPIZIDE 5 MG PO TABS
5.0000 mg | ORAL_TABLET | Freq: Two times a day (BID) | ORAL | Status: DC
  Administered 2018-09-27: 5 mg via ORAL
  Filled 2018-09-27 (×2): qty 1

## 2018-09-27 MED ORDER — BLOOD GLUCOSE MONITOR KIT: PACK | 0 refills | Status: DC

## 2018-09-27 MED ORDER — GLIPIZIDE 5 MG PO TABS: 5.0000 mg | ORAL_TABLET | Freq: Two times a day (BID) | ORAL | 3 refills | Status: DC

## 2018-09-27 NOTE — Discharge Summary (Signed)
Physician Discharge Summary  Jodi Mcgee BZJ:[id number] DOB: [birth year] DOA: 09/26/2018  PCP: Wilber Oliphant, MD  Admit date: 09/26/2018 Discharge date: 09/27/2018  Admitted From: home Disposition:  Home  Recommendations for Outpatient Follow-up:  1. Follow up with PCP in 1-2 weeks 2. Please obtain BMP/CBC in one week 3. We will keep a blood glucose monitor log which she will take to her primary care doctor.  Home Health:No Equipment/Devices:none  Discharge Condition:stable CODE STATUS:full Diet recommendation: Heart Healthy  Brief/Interim Summary: 32 y.o. female past medical history of diabetes mellitus, noncompliant with her medication that comes in with polyuria polydipsia and fatigue for the last month she relates her sugars been elevated at home she she was started on metformin about a month ago by her regular primary care doctor, but she really despite this her sugar continues to be high.  Discharge Diagnoses:  Active Problems:   Type 2 diabetes mellitus, without long-term current use of insulin ([place])   Uncontrolled type 2 DM with hyperosmolar nonketotic hyperglycemia (HCC)   Hyponatremia   Uncontrolled type 2 diabetes mellitus with hyperosmolar nonketotic hyperglycemia ([place]) She was started on IV insulin and IV fluids her blood glucose improved she had no ketones but but her acidosis resolved.her hyperosmolar state resolved, she was started on long-acting insulin glipizide and metformin which should continue as an outpatient. To follow-up with her primary care doctor and she will check CBGs 4 times a day with food with meals. Hopefully her regimen can be titrated as an outpatient.   Discharge Instructions  Discharge Instructions    Diet - low sodium heart healthy   Complete by:  As directed    For home use only DME Glucometer   Complete by:  As directed    Increase activity slowly   Complete by:  As directed      Allergies as of 09/27/2018      Reactions    Cetirizine & Related Itching, Swelling   Toradol [ketorolac Tromethamine] Hives, Itching   Contrast Media [iodinated Diagnostic Agents] Itching   Mild itching after IV contrast on 6/1/7 No urticaria visible No wheezing No angioedema   Nsaids Rash      Medication List    TAKE these medications   blood glucose meter kit and supplies Kit Dispense based on patient and insurance preference. Use up to four times daily as directed. (FOR ICD-9 250.00, 250.01).   glipiZIDE 5 MG tablet Commonly known as:  GLUCOTROL Take 1 tablet (5 mg total) by mouth 2 (two) times daily before a meal.   insulin detemir 100 unit/ml Soln Commonly known as:  LEVEMIR Inject 0.2 mLs (20 Units total) into the skin daily.   levonorgestrel 20 MCG/24HR IUD Commonly known as:  MIRENA 1 each by Intrauterine route once.   metFORMIN 1000 MG tablet Commonly known as:  GLUCOPHAGE Take 1 tablet (1,000 mg total) by mouth 2 (two) times daily with a meal.   methocarbamol 500 MG tablet Commonly known as:  ROBAXIN Take 1 tablet (500 mg total) by mouth 2 (two) times daily.            Durable Medical Equipment  (From admission, onward)         Start     Ordered   09/27/18 0000  For home use only DME Glucometer     09/27/18 0733          Allergies  Allergen Reactions  . Cetirizine & Related Itching and Swelling  . Toradol [Ketorolac  Tromethamine] Hives and Itching  . Contrast Media [Iodinated Diagnostic Agents] Itching    Mild itching after IV contrast on 6/1/7 No urticaria visible No wheezing No angioedema  . Nsaids Rash    Consultations:  None   Procedures/Studies: No results found.  Subjective: No complains  Discharge Exam: Vitals:   09/27/18 0500 09/27/18 0600  BP: 124/75 (!) 105/44  Pulse: 81 86  Resp: 16   Temp:    SpO2: 95% 95%   Vitals:   09/27/18 0300 09/27/18 0400 09/27/18 0500 09/27/18 0600  BP: 113/71 119/67 124/75 (!) 105/44  Pulse: 90 90 81 86  Resp: '16 16 16    ' Temp:  98.1 F (36.7 C)    TempSrc:  Oral    SpO2: 96% 95% 95% 95%  Weight:      Height:        General: Pt is alert, awake, not in acute distress Cardiovascular: RRR, S1/S2 +, no rubs, no gallops Respiratory: CTA bilaterally, no wheezing, no rhonchi Abdominal: Soft, NT, ND, bowel sounds + Extremities: no edema, no cyanosis    The results of significant diagnostics from this hospitalization (including imaging, microbiology, ancillary and laboratory) are listed below for reference.     Microbiology: Recent Results (from the past 240 hour(s))  MRSA PCR Screening     Status: None   Collection Time: 09/27/18 12:04 AM  Result Value Ref Range Status   MRSA by PCR NEGATIVE NEGATIVE Final    Comment:        The GeneXpert MRSA Assay (FDA approved for NASAL specimens only), is one component of a comprehensive MRSA colonization surveillance program. It is not intended to diagnose MRSA infection nor to guide or monitor treatment for MRSA infections. Performed at [facility], [place] [address], [place], [place] [address]      Labs: BNP (last 3 results) Recent Labs    11/26/17 2219 11/30/17 1641  BNP 34.7 030.0*   Basic Metabolic Panel: Recent Labs  Lab 09/26/18 1204 09/27/18 0342  NA 127* 136  K 4.4 3.7  CL 100 109  CO2 15* 20*  GLUCOSE 662* 175*  BUN 18 16  CREATININE 0.87 0.68  CALCIUM 7.2* 8.4*   Liver Function Tests: Recent Labs  Lab 09/27/18 0342  AST 15  ALT 21  ALKPHOS 66  BILITOT 0.7  PROT 6.3*  ALBUMIN 3.2*   No results for input(s): LIPASE, AMYLASE in the last 168 hours. No results for input(s): AMMONIA in the last 168 hours. CBC: Recent Labs  Lab 09/26/18 1119 09/27/18 0342  WBC 6.6 3.7*  NEUTROABS 5.5  --   HGB 13.3 11.6*  HCT 42.9 38.5  MCV 80.3 82.1  PLT 390 184   Cardiac Enzymes: No results for input(s): CKTOTAL, CKMB, CKMBINDEX, TROPONINI in the last 168 hours. BNP: Invalid input(s): POCBNP CBG: Recent  Labs  Lab 09/27/18 0318 09/27/18 0419 09/27/18 0522 09/27/18 0624 09/27/18 0729  GLUCAP 211* 107* 112* 153* 167*   D-Dimer No results for input(s): DDIMER in the last 72 hours. Hgb A1c Recent Labs    09/26/18 1202  HGBA1C 11.0*   Lipid Profile No results for input(s): CHOL, HDL, LDLCALC, TRIG, CHOLHDL, LDLDIRECT in the last 72 hours. Thyroid function studies No results for input(s): TSH, T4TOTAL, T3FREE, THYROIDAB in the last 72 hours.  Invalid input(s): FREET3 Anemia work up No results for input(s): VITAMINB12, FOLATE, FERRITIN, TIBC, IRON, RETICCTPCT in the last 72 hours. Urinalysis    Component Value Date/Time  COLORURINE COLORLESS (A) 09/26/2018 1119   APPEARANCEUR CLEAR 09/26/2018 1119   APPEARANCEUR Clear 06/26/2014 0116   LABSPEC 1.023 09/26/2018 1119   LABSPEC 1.031 06/26/2014 0116   PHURINE 5.0 09/26/2018 1119   GLUCOSEU >=500 (A) 09/26/2018 1119   GLUCOSEU Negative 06/26/2014 0116   HGBUR SMALL (A) 09/26/2018 1119   BILIRUBINUR NEGATIVE 09/26/2018 1119   BILIRUBINUR Negative 06/26/2014 0116   KETONESUR 20 (A) 09/26/2018 1119   PROTEINUR NEGATIVE 09/26/2018 1119   UROBILINOGEN 2.0 (H) 11/12/2017 0828   NITRITE NEGATIVE 09/26/2018 1119   LEUKOCYTESUR NEGATIVE 09/26/2018 1119   LEUKOCYTESUR Trace 06/26/2014 0116   Sepsis Labs Invalid input(s): PROCALCITONIN,  WBC,  LACTICIDVEN Microbiology Recent Results (from the past 240 hour(s))  MRSA PCR Screening     Status: None   Collection Time: 09/27/18 12:04 AM  Result Value Ref Range Status   MRSA by PCR NEGATIVE NEGATIVE Final    Comment:        The GeneXpert MRSA Assay (FDA approved for NASAL specimens only), is one component of a comprehensive MRSA colonization surveillance program. It is not intended to diagnose MRSA infection nor to guide or monitor treatment for MRSA infections. Performed at [facility], [place] [address], [place], [place] [address]      Time coordinating  discharge: 40 minutes  SIGNED:   Charlynne Cousins, MD  [facility] 09/27/2018, 7:33 AM Pager   If 7PM-7AM, please contact night-coverage [email or web address] Password [email or web address]

## 2018-09-27 NOTE — Care Management Note (Signed)
Case Management Note  Patient Details  Name: Jodi Mcgee MRN: [id number] Date of Birth: Mar 14, 1986  Subjective/Objective:                  discharged  Action/Plan: Discharged to home with self-care, orders checked for hhc needs. No CM needs present at time of discharge.  Patient is able to arrangement own appointments and home care.  Expected Discharge Date:  09/27/18               Expected Discharge Plan:  Home/Self Care  In-House Referral:     Discharge planning Services  CM Consult  Post Acute Care Choice:    Choice offered to:     DME Arranged:    DME Agency:     HH Arranged:    HH Agency:     Status of Service:  Completed, signed off  If discussed at [employer] of Stay Meetings, dates discussed:    Additional Comments:  Leeroy Cha, RN 09/27/2018, 9:43 AM

## 2018-09-27 NOTE — Progress Notes (Addendum)
Late Entry:  Met with patient around 11am today.  Pt told me she had Gestational DM with her last pregnancy.  Took Insulin injections during her pregnancy so is familiar with drawing up and giving injections.  Has not given herself an injection this hospitalization, however, stated to me she was comfortable giving injections.  Pt able to Verbally review with me how to properly draw up and inject insulin with vial and syringe.  Spoke with pt about diabetes.  Explained what an A1C is, basic pathophysiology of DM Type 2, basic home care, basic diabetes diet nutrition principles, importance of checking CBGs and maintaining good CBG control to prevent long-term and short-term complications.  Reviewed signs and symptoms of hyperglycemia and hypoglycemia and how to treat hypoglycemia at home.  Also reviewed blood sugar goals and A1c goals for home.    RNs to provide ongoing basic DM education at bedside with this patient.  I requested that the RN caring for the patient today allow pt to give an injection prior to d/c today.     --Will follow patient during hospitalization--  Wyn Quaker RN, MSN, CDE Diabetes Coordinator Inpatient Glycemic Control Team Team Pager: [phone] (8a-5p)

## 2018-10-28 ENCOUNTER — Encounter (HOSPITAL_COMMUNITY): Payer: Self-pay | Admitting: Emergency Medicine

## 2018-10-28 ENCOUNTER — Emergency Department (HOSPITAL_COMMUNITY): Payer: Medicaid Other

## 2018-10-28 ENCOUNTER — Emergency Department (HOSPITAL_COMMUNITY)
Admission: EM | Admit: 2018-10-28 | Discharge: 2018-10-29 | Disposition: A | Discharge status: Home / Self Care | Payer: Medicaid Other | Attending: Emergency Medicine | Admitting: Emergency Medicine

## 2018-10-28 DIAGNOSIS — E279 Disorder of adrenal gland, unspecified: Secondary | ICD-10-CM | POA: Insufficient documentation

## 2018-10-28 DIAGNOSIS — R102 Pelvic and perineal pain: Secondary | ICD-10-CM | POA: Insufficient documentation

## 2018-10-28 DIAGNOSIS — G912 (Idiopathic) normal pressure hydrocephalus: Secondary | ICD-10-CM

## 2018-10-28 DIAGNOSIS — Z794 Long term (current) use of insulin: Secondary | ICD-10-CM | POA: Insufficient documentation

## 2018-10-28 DIAGNOSIS — N939 Abnormal uterine and vaginal bleeding, unspecified: Secondary | ICD-10-CM | POA: Insufficient documentation

## 2018-10-28 DIAGNOSIS — Z87891 Personal history of nicotine dependence: Secondary | ICD-10-CM | POA: Diagnosis not present

## 2018-10-28 DIAGNOSIS — Z79899 Other long term (current) drug therapy: Secondary | ICD-10-CM | POA: Diagnosis not present

## 2018-10-28 DIAGNOSIS — E1165 Type 2 diabetes mellitus with hyperglycemia: Secondary | ICD-10-CM | POA: Insufficient documentation

## 2018-10-28 DIAGNOSIS — R1032 Left lower quadrant pain: Secondary | ICD-10-CM | POA: Diagnosis present

## 2018-10-28 DIAGNOSIS — R1030 Lower abdominal pain, unspecified: Secondary | ICD-10-CM

## 2018-10-28 DIAGNOSIS — E278 Other specified disorders of adrenal gland: Secondary | ICD-10-CM

## 2018-10-28 DIAGNOSIS — R739 Hyperglycemia, unspecified: Secondary | ICD-10-CM

## 2018-10-28 DIAGNOSIS — I10 Essential (primary) hypertension: Secondary | ICD-10-CM | POA: Diagnosis not present

## 2018-10-28 LAB — URINALYSIS, ROUTINE W REFLEX MICROSCOPIC
Bacteria, UA: NONE SEEN
Bilirubin Urine: NEGATIVE
Glucose, UA: >=500 mg/dL — AB
Ketones, ur: 20 mg/dL — AB
Leukocytes, UA: NEGATIVE
Nitrite: NEGATIVE
Protein, ur: NEGATIVE mg/dL
Specific Gravity, Urine: 1.031 — ABNORMAL HIGH (ref 1.005–1.030)
pH: 6 (ref 5.0–8.0)

## 2018-10-28 LAB — CBC
HCT: 41.2 % (ref 36.0–46.0)
Hemoglobin: 12.8 g/dL (ref 12.0–15.0)
MCH: 25.3 pg — AB (ref 26.0–34.0)
MCHC: 31.1 g/dL (ref 30.0–36.0)
MCV: 81.6 fL (ref 80.0–100.0)
Platelets: 316 10*3/uL (ref 150–400)
RBC: 5.05 MIL/uL (ref 3.87–5.11)
RDW: 14.7 % (ref 11.5–15.5)
WBC: 4.4 10*3/uL (ref 4.0–10.5)
nRBC: 0 % (ref 0.0–0.2)

## 2018-10-28 LAB — COMPREHENSIVE METABOLIC PANEL
ALT: 15 U/L (ref 0–44)
AST: 12 U/L — ABNORMAL LOW (ref 15–41)
Albumin: 3.8 g/dL (ref 3.5–5.0)
Alkaline Phosphatase: 83 U/L (ref 38–126)
Anion gap: 10 (ref 5–15)
BUN: 12 mg/dL (ref 6–20)
CO2: 22 mmol/L (ref 22–32)
Calcium: 8.9 mg/dL (ref 8.9–10.3)
Chloride: 101 mmol/L (ref 98–111)
Creatinine, Ser: 0.84 mg/dL (ref 0.44–1.00)
GFR calc Af Amer: >60 mL/min (ref >60)
GFR calc non Af Amer: >60 mL/min (ref >60)
Glucose, Bld: 506 mg/dL (ref 70–99)
POTASSIUM: 4 mmol/L (ref 3.5–5.1)
Sodium: 133 mmol/L — ABNORMAL LOW (ref 135–145)
Total Bilirubin: 0.6 mg/dL (ref 0.3–1.2)
Total Protein: 7.6 g/dL (ref 6.5–8.1)

## 2018-10-28 LAB — I-STAT BETA HCG BLOOD, ED (MC, WL, AP ONLY): I-stat hCG, quantitative: <5 m[IU]/mL (ref <5)

## 2018-10-28 LAB — LIPASE, BLOOD: Lipase: 35 U/L (ref 11–51)

## 2018-10-28 LAB — CBG MONITORING, ED: Glucose-Capillary: 486 mg/dL — ABNORMAL HIGH (ref 70–99)

## 2018-10-28 MED ORDER — FENTANYL CITRATE (PF) 100 MCG/2ML IJ SOLN
50.0000 ug | Freq: Once | INTRAMUSCULAR | Status: AC
  Administered 2018-10-28: 50 ug via INTRAVENOUS
  Filled 2018-10-28: qty 2

## 2018-10-28 MED ORDER — INSULIN ASPART 100 UNIT/ML ~~LOC~~ SOLN
6.0000 [IU] | Freq: Once | SUBCUTANEOUS | Status: AC
  Administered 2018-10-28: 6 [IU] via INTRAVENOUS
  Filled 2018-10-28: qty 1

## 2018-10-28 MED ORDER — SODIUM CHLORIDE 0.9 % IV BOLUS
1000.0000 mL | Freq: Once | INTRAVENOUS | Status: AC
  Administered 2018-10-28: 1000 mL via INTRAVENOUS

## 2018-10-28 NOTE — ED Provider Notes (Signed)
2 days of LLQ pain, worse last night Urinary pressure Has hematuria here Elevated CBG here, 506, no acidosis CT can, no stones Pelvic exam shows left adnexal tenderness, cervical bleeding  Pending Pelvic US to r/o pathology.  Plan: anticipate d/ch home Needs to follow up with PCP regarding incidental adrenal adenoma seen on CT. Patient aware.   Hyperglycemia improving. [place] without abnormality. Recheck finds the patient comfortable and requesting discharge home. She can be discharged per plan of previous treatment team.    Charlann Lange, PA-C 10/29/18 7998    Little, Wenda Overland, MD 10/29/18 [phone]

## 2018-10-28 NOTE — Discharge Instructions (Signed)
As we discussed, your CT scan showed a small adrenal nodule.  This needs to be followed up by your primary care doctor.  Continue taking her diabetes medications as directed.  [facility]  Return emergency part for any worsening pain, vomiting, chest pain, difficulty breathing or any other worsening or concerning symptoms.

## 2018-10-28 NOTE — ED Triage Notes (Signed)
Per [facility] pt from home for LLQ pains x 2 days. N/v x 3 days. Today started having bright red blood in urine today. Pt CBG 477. Vitals: 119/96, HR 78

## 2018-10-28 NOTE — ED Provider Notes (Signed)
[place] DEPT Provider Note   CSN: [id number] Arrival date & time: 10/28/18  1808     History   Chief Complaint Chief Complaint  Patient presents with  . Abdominal Pain  . Hematuria  . Emesis  . Hyperglycemia    HPI Jodi Mcgee is a 33 y.o. female past medical history of anemia, diabetes, GERD, IBS, ovarian cyst who presents for evaluation of left lower quadrant abdominal pain that began 2 days ago.  She reports that pain was very mild at the beginning and then gradually worsened.  She reports that since yesterday, is been more constant.  Patient states she does have a history of ovarian cyst.  Patient reports she has been taking Tylenol at home with no improvement in her pain.  Patient does report that since yesterday, she has had nausea and vomiting.  She reports no blood in emesis.  Additionally, yesterday, she started noticing hematuria.  She states that she has not had any vaginal bleeding has not had to wear any pad.  Patient states she has not had some dysuria but has reported some pressure with urinating.  Patient also reports she has been taking her medications as directed.  She has not missed any of her insulin, metformin.  Patient denies any fever, chest pain, difficulty breathing.  The history is provided by the patient.    Past Medical History:  Diagnosis Date  . Anemia   . Anxiety   . Blood transfusion without reported diagnosis    both CS  . Diabetes mellitus without complication ([place])   . GERD (gastroesophageal reflux disease)    has resolved  . IBS (irritable bowel syndrome)   . Ovarian cyst   . Peritonitis, acute generalized ([facility])   . Pulmonary embolism ([place])   . Sepsis [facility])     Patient Active Problem List   Diagnosis Date Noted  . Uncontrolled type 2 DM with hyperosmolar nonketotic hyperglycemia ([place]) 09/26/2018  . Hyponatremia 09/26/2018  . Uncontrolled type 2 diabetes mellitus with hyperosmolar nonketotic  hyperglycemia ([place]) 09/26/2018  . History of cesarean section, classical 01/03/2018  . Chronic hypertension 01/03/2018  . IUD contraception 01/03/2018  . Postpartum care and examination 01/03/2018  . Pulmonary embolism during puerperium 12/04/2017  . Pulmonary edema 11/30/2017  . S/P cesarean section 11/21/2017  . Pelvic adhesive disease 10/22/2017  . Nasal septal perforation 10/17/2017  . History of cocaine abuse ([place]) 10/17/2017  . Epistaxis 10/17/2017  . 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 Obesity (BMI 30-39.9) 09/25/2017  . Hypertension in pregnancy, preeclampsia, severe, delivered/postpartum 09/13/2017  . Previous cesarean section complicating pregnancy 38/25/0539  . Type 2 diabetes mellitus, without long-term current use of insulin ([place]) 08/23/2017  . Chronic pain syndrome 08/23/2017    Past Surgical History:  Procedure Laterality Date  . APPENDECTOMY     ruptured   . BUNIONECTOMY    . CESAREAN SECTION    . CESAREAN SECTION Bilateral 11/20/2017   Procedure: CESAREAN SECTION;  Surgeon: Sloan Leiter, MD;  Location: [place];  Service: Obstetrics;  Laterality: Bilateral;  . OVARIAN CYST DRAINAGE    . SMALL BOWEL REPAIR       OB History    Gravida  3   Para  3   Term  1   Preterm  2   AB  0   Living  3     SAB  0   TAB  0   Ectopic  0  Multiple  0   Live Births  3            Home Medications    Prior to Admission medications   Medication Sig Start Date End Date Taking? Authorizing Provider  acetaminophen (TYLENOL) 500 MG tablet Take 1,000 mg by mouth daily as needed for moderate pain.   Yes [provider]  glipiZIDE (GLUCOTROL) 5 MG tablet Take 1 tablet (5 mg total) by mouth 2 (two) times daily before a meal. 09/27/18  Yes Charlynne Cousins, MD  insulin detemir (LEVEMIR) 100 unit/ml SOLN Inject 0.2 mLs (20 Units total) into the skin daily. 09/27/18  Yes Charlynne Cousins, MD  levonorgestrel (MIRENA) 20 MCG/24HR  IUD 1 each by Intrauterine route once.   Yes [provider]  metFORMIN (GLUCOPHAGE) 1000 MG tablet Take 1 tablet (1,000 mg total) by mouth 2 (two) times daily with a meal. 09/12/18  Yes Recardo Evangelist, PA-C  blood glucose meter kit and supplies KIT Dispense based on patient and insurance preference. Use up to four times daily as directed. (FOR ICD-9 250.00, 250.01). 09/27/18   Charlynne Cousins, MD  methocarbamol (ROBAXIN) 500 MG tablet Take 1 tablet (500 mg total) by mouth 2 (two) times daily. Patient not taking: Reported on 10/28/2018 09/12/18   Recardo Evangelist, PA-C    Family History Family History  Problem Relation Age of Onset  . Hypertension Mother   . Anemia Mother   . Diabetes Father     Social History Social History   Tobacco Use  . Smoking status: Former Smoker    Packs/day: 0.50    Types: Cigarettes  . Smokeless tobacco: Never Used  Substance Use Topics  . Alcohol use: Not Currently  . Drug use: Not Currently     Allergies   Cetirizine & related; Toradol [ketorolac tromethamine]; Contrast media [iodinated diagnostic agents]; and Nsaids   Review of Systems Review of Systems  Constitutional: Negative for fever.  Respiratory: Negative for cough and shortness of breath.   Cardiovascular: Negative for chest pain.  Gastrointestinal: Positive for abdominal pain, nausea and vomiting.  Genitourinary: Positive for flank pain and hematuria. Negative for dysuria.  Neurological: Negative for headaches.  All other systems reviewed and are negative.    Physical Exam Updated Vital Signs BP 123/74 (BP Location: Right Arm)   Pulse 80   Temp 98.6 F (37 C) (Oral)   Resp 16   SpO2 99%   Physical Exam Vitals signs and nursing note reviewed. Exam conducted with a chaperone present.  Constitutional:      Appearance: Normal appearance. She is well-developed.  HENT:     Head: Normocephalic and atraumatic.  Eyes:     General: Lids are normal.      Conjunctiva/sclera: Conjunctivae normal.     Pupils: Pupils are equal, round, and reactive to light.  Neck:     Musculoskeletal: Full passive range of motion without pain.  Cardiovascular:     Rate and Rhythm: Normal rate and regular rhythm.     Pulses: Normal pulses.     Heart sounds: Normal heart sounds. No murmur. No friction rub. No gallop.   Pulmonary:     Effort: Pulmonary effort is normal.     Breath sounds: Normal breath sounds.     Comments: Lungs clear to auscultation bilaterally.  Symmetric chest rise.  No wheezing, rales, rhonchi. Abdominal:     Palpations: Abdomen is soft. Abdomen is not rigid.     Tenderness: There  is abdominal tenderness in the left lower quadrant. There is left CVA tenderness. There is no right CVA tenderness or guarding. Negative signs include McBurney's sign.     Comments: Abdomen is soft, nondistended.  Tenderness noted to left lower quadrant.  Left-sided CVA tenderness present.  Genitourinary:    Vagina: Normal.     Cervix: Cervical bleeding present. No cervical motion tenderness.     Uterus: Normal.      Adnexa: Right adnexa normal.       Left: Tenderness present. No mass.       Comments: The exam was performed with a chaperone present. Normal external female genitalia. No lesions, rash, or sores.  Limited visualization secondary to bleeding noted in the cervix.  Cervical loss appears closed.  There appears to be out 1 cm of white thread coming out from the cervix that looks to be the IUD string.  No CMT.  Patient with left adnexal tenderness.  No mass. Musculoskeletal: Normal range of motion.  Skin:    General: Skin is warm and dry.     Capillary Refill: Capillary refill takes less than 2 seconds.  Neurological:     Mental Status: She is alert and oriented to person, place, and time.  Psychiatric:        Speech: Speech normal.      ED Treatments / Results  Labs (all labs ordered are listed, but only abnormal results are displayed) Labs  Reviewed  WET PREP, GENITAL - Abnormal; Notable for the following components:      Result Value   Clue Cells Wet Prep HPF POC PRESENT (*)    WBC, Wet Prep HPF POC FEW (*)    All other components within normal limits  COMPREHENSIVE METABOLIC PANEL - Abnormal; Notable for the following components:   Sodium 133 (*)    Glucose, Bld 506 (*)    AST 12 (*)    All other components within normal limits  CBC - Abnormal; Notable for the following components:   MCH 25.3 (*)    All other components within normal limits  URINALYSIS, ROUTINE W REFLEX MICROSCOPIC - Abnormal; Notable for the following components:   Color, Urine STRAW (*)    Specific Gravity, Urine 1.031 (*)    Glucose, UA >=500 (*)    Hgb urine dipstick LARGE (*)    Ketones, ur 20 (*)    All other components within normal limits  CBG MONITORING, ED - Abnormal; Notable for the following components:   Glucose-Capillary 486 (*)    All other components within normal limits  CBG MONITORING, ED - Abnormal; Notable for the following components:   Glucose-Capillary 347 (*)    All other components within normal limits  LIPASE, BLOOD  I-STAT BETA HCG BLOOD, ED ([facility], [facility], [facility] ONLY)  CBG MONITORING, ED  GC/CHLAMYDIA PROBE AMP ([place]) NOT AT [facility]    EKG None  Radiology US Pelvis Complete  Result Date: 10/29/2018 CLINICAL DATA:  Heavy bleeding EXAM: TRANSABDOMINAL AND TRANSVAGINAL ULTRASOUND OF PELVIS DOPPLER ULTRASOUND OF OVARIES TECHNIQUE: Both transabdominal and transvaginal ultrasound examinations of the pelvis were performed. Transabdominal technique was performed for global imaging of the pelvis including uterus, ovaries, adnexal regions, and pelvic cul-de-sac. It was necessary to proceed with endovaginal exam following the transabdominal exam to visualize the uterus endometrium ovaries. Color and duplex Doppler ultrasound was utilized to evaluate blood flow to the ovaries. COMPARISON:  CT 10/28/2018 FINDINGS: Uterus Measurements:  5.2 x 2.1 x 2.5 cm. No fibroids  or other mass visualized. Endometrium Thickness: 2 mm.  IUD in place. Right ovary Measurements: 3.1 x 2.2 x 1.9 cm = volume: 6.8 mL. Normal appearance/no adnexal mass. Left ovary Measurements: 2.8 x 2 x 2.4 cm = volume: 7 mL. Normal appearance/no adnexal mass. Pulsed Doppler evaluation of both ovaries demonstrates normal low-resistance arterial and venous waveforms. Other findings No abnormal free fluid. IMPRESSION: Negative pelvic ultrasound. No evidence for torsion. Intrauterine device appears appropriately positioned by sonography. Electronically Signed   By: Donavan Foil M.D.   On: 10/29/2018 00:08   Ct Renal Stone Study  Result Date: 10/28/2018 CLINICAL DATA:  Left lower quadrant pain for 2 days with nausea and vomiting for 3 days. Bright red blood in urine today. EXAM: CT ABDOMEN AND PELVIS WITHOUT CONTRAST TECHNIQUE: Multidetector CT imaging of the abdomen and pelvis was performed following the standard protocol without IV contrast. COMPARISON:  None. FINDINGS: Lower chest: Clear lung bases. The included heart is normal in size without pericardial effusion or thickening. No calcific coronary arteriosclerosis is identified of the included heart. Hepatobiliary: No focal liver abnormality is seen. No gallstones, gallbladder wall thickening, or biliary dilatation. Pancreas: Unremarkable. No pancreatic ductal dilatation or surrounding inflammatory changes. Spleen: Normal in size without focal abnormality. Adrenals/Urinary Tract: 13 x 11 mm low-density right adrenal nodule compatible with a benign adenoma. The left adrenal gland is normal. Both kidneys demonstrate no apparent mass given limitations of a noncontrast study. No nephrolithiasis nor hydroureteronephrosis is identified. The urinary bladder is physiologically distended without focal mural thickening or calculi. Stomach/Bowel: Physiologic distention of the stomach with normal small bowel rotation. No bowel obstruction or  inflammation. The distal and terminal ileum are normal. The patient is status post appendectomy. Moderate stool retention within the colon without obstruction or inflammation. Sigmoid diverticulosis without acute diverticulitis. Vascular/Lymphatic: No significant vascular findings are present. No enlarged abdominal or pelvic lymph nodes. Reproductive: IUD noted in the endometrium. No adnexal mass. Other: Free air nor free fluid. Ventral pelvic scarring. Musculoskeletal: No acute or significant osseous findings. IMPRESSION: 1. Sigmoid diverticulosis without acute diverticulitis. No bowel obstruction or inflammation. 2. 13 x 11 mm right adrenal nodule compatible with a benign adenoma. 3. No CT findings to explain the patient's hematuria. Electronically Signed   By: Ashley Royalty M.D.   On: 10/28/2018 22:05    Procedures Procedures (including critical care time)  Medications Ordered in ED Medications  sodium chloride 0.9 % bolus 1,000 mL (0 mLs Intravenous Stopped 10/29/18 0047)  insulin aspart (novoLOG) injection 6 Units (6 Units Intravenous Given 10/28/18 2237)  fentaNYL (SUBLIMAZE) injection 50 mcg (50 mcg Intravenous Given 10/28/18 2237)  fentaNYL (SUBLIMAZE) injection 50 mcg (50 mcg Intravenous Given 10/29/18 0055)     Initial Impression / Assessment and Plan / ED Course  I have reviewed the triage vital signs and the nursing notes.  Pertinent labs & imaging results that were available during my care of the patient were reviewed by me and considered in my medical decision making (see chart for details).     33 year old female past no history of diabetes, GERD who presents for evaluation of 2 days of left lower quadrant abdominal pain associated with hematuria, nausea/vomiting.  No fevers, chest pain, difficulty breathing. Patient is afebrile, non-toxic appearing, sitting comfortably on examination table. Vital signs reviewed and stable.  Concern for kidney stone versus infectious process.  Low  suspicion for ovarian etiology but also consideration.  I-STAT beta negative.  UA shows large amount of hemoglobin.  There is small amount of ketones noted.  CMP shows bicarb of 22, glucose of 506.  Anion gap is normal.  CBC shows no evidence of leukocytosis or anemia.  Lipase unremarkable. Work up is not concerning for DKA. Will give insulin for hyperglycemia.   CT renal study shows diverticulosis without any acute diverticulitis.  No bowel obstruction or inflammation.  There is a 13 x 11 right adrenal nodule compatible with a benign adenoma.  IUD noted in the endometrium.   Signed out to Charlann Lange, PA-C with U/S pending. Please see her note for further ED course.    Final Clinical Impressions(s) / ED Diagnoses   Final diagnoses:  Hyperglycemia  Abnormal uterine bleeding  Adrenal nodule [facility])  Lower abdominal pain    ED Discharge Orders    None       Desma Mcgregor 10/29/18 1548    Little, Wenda Overland, MD 10/29/18 1914

## 2018-10-28 NOTE — ED Notes (Signed)
Date and time results received: 10/28/18 1909 (use smartphrase ".now" to insert current time)  Test: glucose Critical Value: 506  Name of Provider Notified: [facility] Lanius  Orders Received? Or Actions Taken?: Actions Taken: notified [facility] Lanius pts glucose 506.

## 2018-10-29 LAB — WET PREP, GENITAL
Sperm: NONE SEEN
Trich, Wet Prep: NONE SEEN
YEAST WET PREP: NONE SEEN

## 2018-10-29 LAB — GC/CHLAMYDIA PROBE AMP (~~LOC~~) NOT AT ARMC
Chlamydia: NEGATIVE
Neisseria Gonorrhea: NEGATIVE

## 2018-10-29 LAB — CBG MONITORING, ED: Glucose-Capillary: 347 mg/dL — ABNORMAL HIGH (ref 70–99)

## 2018-10-29 MED ORDER — FENTANYL CITRATE (PF) 100 MCG/2ML IJ SOLN
50.0000 ug | Freq: Once | INTRAMUSCULAR | Status: AC
  Administered 2018-10-29: 50 ug via INTRAVENOUS
  Filled 2018-10-29: qty 2

## 2018-11-13 ENCOUNTER — Encounter (HOSPITAL_COMMUNITY): Payer: Self-pay

## 2018-11-13 ENCOUNTER — Inpatient Hospital Stay (HOSPITAL_COMMUNITY)
Admission: AD | Admit: 2018-11-13 | Discharge: 2018-11-13 | Disposition: A | Discharge status: Home / Self Care | Payer: Medicaid Other | Attending: Obstetrics and Gynecology | Admitting: Obstetrics and Gynecology

## 2018-11-13 DIAGNOSIS — T887XXA Unspecified adverse effect of drug or medicament, initial encounter: Secondary | ICD-10-CM | POA: Diagnosis not present

## 2018-11-13 DIAGNOSIS — T373X5A Adverse effect of other antiprotozoal drugs, initial encounter: Secondary | ICD-10-CM | POA: Diagnosis not present

## 2018-11-13 DIAGNOSIS — Z87891 Personal history of nicotine dependence: Secondary | ICD-10-CM | POA: Diagnosis not present

## 2018-11-13 DIAGNOSIS — R102 Pelvic and perineal pain: Secondary | ICD-10-CM | POA: Diagnosis present

## 2018-11-13 DIAGNOSIS — B3731 Acute candidiasis of vulva and vagina: Secondary | ICD-10-CM

## 2018-11-13 DIAGNOSIS — B373 Candidiasis of vulva and vagina: Secondary | ICD-10-CM | POA: Diagnosis not present

## 2018-11-13 MED ORDER — FLUCONAZOLE 150 MG PO TABS: ORAL_TABLET | ORAL | 0 refills | Status: DC

## 2018-11-13 MED ORDER — DIPHENHYDRAMINE HCL 25 MG PO CAPS
50.0000 mg | ORAL_CAPSULE | Freq: Once | ORAL | Status: AC
  Administered 2018-11-13: 50 mg via ORAL
  Filled 2018-11-13: qty 2

## 2018-11-13 MED ORDER — TERCONAZOLE 0.4 % VA CREA: 1.0000 | TOPICAL_CREAM | Freq: Every day | VAGINAL | 1 refills | Status: DC

## 2018-11-13 NOTE — MAU Provider Note (Addendum)
History    Chief Complaint  Patient presents with  . Vaginal Itching  . Vaginal Pain   HPI: Jodi Mcgee is a 33yo [id number] with a history of uncontrolled type 2 diabetes presenting today accompanied by her boyfriend with complaints of vaginal swelling, itching, and pain after starting Flagyl 500 mg PO BID 2 days ago (1/20) for treatment of BV.  Per PCP report on 1/7, patient was diagnosed with BV despite experiencing no vaginal discomfort or discharge d/t bilateral flank pain and suprapubic pain on exam.  Patient reports waiting until 1/20 to start the Flagyl. Vaginal pruritis onset was concurrent with starting the new medicine and worsened over the past 2 days, affecting her external vaginal and perianal area. Patient also reports vaginal swelling, dysuria d/t direct contact of urine to the area, as well as pruritis over her whole body including her throat. Her boyfriend states that she appears to have facial swelling and SOB. Patient denies vaginal bleeding, trouble breathing, hives, urinary frequency and urgency. Patient states that she has never taken Flagyl before. Last flagyl dose was taken last night. Patient reports taking tylenol, benadryl 28m, and using ice packs without improvement of the symptoms. Patient denies recent alcohol intake. Boyfriend also reports that patient has been more irritable in the past couple weeks from stress.     Pertinent Gynecological History: Contraception: IUD Sexually transmitted diseases: no past history Previous GYN Procedures: CS, ovarian cyst drainage  Last pap: normal Date: 01/03/2018   Past Medical History:  Diagnosis Date  . Anemia   . Anxiety   . Blood transfusion without reported diagnosis    both CS  . Diabetes mellitus without complication ([facility]   . GERD (gastroesophageal reflux disease)    has resolved  . IBS (irritable bowel syndrome)   . Ovarian cyst   . Peritonitis, acute generalized ([facility]   . Pulmonary embolism ([facility]   . Sepsis  ([facility]     Past Surgical History:  Procedure Laterality Date  . APPENDECTOMY     ruptured   . BUNIONECTOMY    . CESAREAN SECTION    . CESAREAN SECTION Bilateral 11/20/2017   Procedure: CESAREAN SECTION;  Surgeon: DSloan Leiter MD;  [facility]  Service: Obstetrics;  Laterality: Bilateral;  . OVARIAN CYST DRAINAGE    . SMALL BOWEL REPAIR      Family History  Problem Relation Age of Onset  . Hypertension Mother   . Anemia Mother   . Diabetes Father     Social History   Tobacco Use  . Smoking status: Former Smoker    Packs/day: 0.50    Types: Cigarettes  . Smokeless tobacco: Never Used  Substance Use Topics  . Alcohol use: Not Currently  . Drug use: Not Currently    Allergies:  Allergies  Allergen Reactions  . Cetirizine & Related Itching and Swelling  . Toradol [Ketorolac Tromethamine] Hives and Itching  . Contrast Media [Iodinated Diagnostic Agents] Itching    Mild itching after IV contrast on 6/1/7 No urticaria visible No wheezing No angioedema  . Nsaids Rash    Medications Prior to Admission  Medication Sig Dispense Refill Last Dose  . acetaminophen (TYLENOL) 500 MG tablet Take 1,000 mg by mouth daily as needed for moderate pain.   10/28/2018 at Unknown time  . blood glucose meter kit and supplies KIT Dispense based on patient and insurance preference. Use up to four times daily as directed. (FOR ICD-9 250.00, 250.01). 1 each 0   .  glipiZIDE (GLUCOTROL) 5 MG tablet Take 1 tablet (5 mg total) by mouth 2 (two) times daily before a meal. 60 tablet 3 10/28/2018 at Unknown time  . insulin detemir (LEVEMIR) 100 unit/ml SOLN Inject 0.2 mLs (20 Units total) into the skin daily. 2 vial 0 10/28/2018 at Unknown time  . levonorgestrel (MIRENA) 20 MCG/24HR IUD 1 each by Intrauterine route once.   09/26/2018 at Unknown time  . metFORMIN (GLUCOPHAGE) 1000 MG tablet Take 1 tablet (1,000 mg total) by mouth 2 (two) times daily with a meal. 60 tablet 3 10/28/2018 at  Unknown time  . methocarbamol (ROBAXIN) 500 MG tablet Take 1 tablet (500 mg total) by mouth 2 (two) times daily. (Patient not taking: Reported on 10/28/2018) 20 tablet 0 Not Taking at Unknown time    Review of Systems  Constitutional: Positive for chills. Negative for fever.  HENT:       Positive for facial edema, pharyngeal pruritis, SOB (reported by boyfriend). Negative for trouble breathing and throat swelling.   Respiratory: Positive for cough and shortness of breath.   Gastrointestinal: Positive for constipation (states that her anal area is too swollen to have a BM). Negative for abdominal pain and diarrhea.  Genitourinary: Positive for dysuria. Negative for frequency and urgency.       See HPI.  Skin: Positive for itching. Negative for rash.       No hives   Physical Exam Pulse (!) 109, temperature 98 F (36.7 C), temperature source Oral, resp. rate 17, height _0  (1.702 m), weight 103.9 kg, SpO2 99 %, not currently breastfeeding. Physical Exam  Constitutional: She appears well-developed and well-nourished. No distress.  Patient appears uncomfortable and irritable.   HENT:  Head: Normocephalic and atraumatic.  Mouth/Throat: Oropharynx is clear and moist.  No angioedema Oral mucous membranes pink and moist without candidasis. Posterior pharynx non-edematous   Cardiovascular: Normal rate.  Respiratory: Effort normal. No stridor. No respiratory distress.  Genitourinary:    Vaginal discharge (white, clumpy appearance consistent with candida) and tenderness present.     No vaginal erythema or bleeding.  There is tenderness in the vagina. No erythema or bleeding in the vagina.    Genitourinary Comments: Labial swelling and tenderness without erythema, rash, or hives. Pelvic exam deferred d/t pain   Musculoskeletal:        General: No edema.  Skin: Skin is warm and dry. No rash noted. No erythema.  No hives.    MAU Course Patient presented with complaints of vaginal swelling,  itching, and pain after starting Flagyl. No angioedema, hives, rash, or scratching was noted during interview. Patient had vaginal discharge noted on exam that was consistent with candida. Pelvic exam and wet prep deferred d/t pain and grossly positive candidal vaginal discharge on inspection. Patient received Benadryl 64m PO and ice packs in MAU without improvement of symptoms.  No results found for this or any previous visit (from the past 24 hour(s)).  Assessment/Plan Candida vulvovaginitis-  Most likely due to recent antibiotic use with concurrent onset of symptoms as well as uncontrolled diabetes that likely exacerbated the infection. Question of allergy to Flagyl, however, patient exhibited no erythema, hives, rash, itching, or angioedema on exam.  Start diflucan 150 mg  Patient educated that this was most likely not an allergic reaction and was a severe yeast infection possibly brought on by antibiotic use and exacerbated by her uncontrolled diabetes. Patient encouraged to maintain blood sugar levels with insulin compliance to reduce risk of reoccurrence.  Recommended probiotic   Magnus Ivan PA-S 11/13/18  CNM attestation:  I have seen and examined this patient and agree with above documentation in the PA student's note.   Jodi Mcgee is a 33 y.o. [phone] at Unknown reporting vaginal pain and swelling, and all over itching. She believes it is due to two days of Flagyl.   PE: Patient Vitals for the past 24 hrs:  BP Temp Temp src Pulse Resp SpO2 Height Weight  11/13/18 1746 114/71 - - - - - - -  11/13/18 1547 - 98 F (36.7 C) Oral (!) 109 17 99 % _0  (1.702 m) 229 lb (103.9 kg)   Gen: calm comfortable, NAD Resp: normal effort, no distress Heart: Regular rate   ROS, labs, PMH reviewed  No orders of the defined types were placed in this encounter.  Meds ordered this encounter  Medications  . diphenhydrAMINE (BENADRYL) capsule 50 mg  . terconazole (TERAZOL 7)  0.4 % vaginal cream    Sig: Place 1 applicator vaginally at bedtime.    Dispense:  45 g    Refill:  1    Order Specific Question:   Supervising Provider    Answer:   [name] [[id number]]  . fluconazole (DIFLUCAN) 150 MG tablet    Sig: Take one pill on Day 1 and one pill on Day 3.    Dispense:  2 tablet    Refill:  0    Order Specific Question:   Supervising Provider    Answer:   Merrily Pew    MDM -low suspicion for anaphylaxis or medication side effect as patient had no itching, hives, throat swelling or rash.  Assessment: 1. Vaginal yeast infection   2. Medication side effect     Plan: - Discharge home in stable condition - Follow-up as scheduled at your doctor's office  -Go to [facility] if you have recurrent of SOB; visit [facility] to discuss yeast infection and follow up with PCP for DM management.   [place], [place] 11/14/2018 9:03 AM

## 2018-11-13 NOTE — MAU Note (Signed)
Seen in ED 2 weeks ago, given meds for BV, started meds on Monday. Reports she started having vaginal swelling, itching. Very painful.

## 2018-11-13 NOTE — Discharge Instructions (Signed)
Diabetes Mellitus and Nutrition, Adult When you have diabetes (diabetes mellitus), it is very important to have healthy eating habits because your blood sugar (glucose) levels are greatly affected by what you eat and drink. Eating healthy foods in the appropriate amounts, at about the same times every day, can help you:  Control your blood glucose.  Lower your risk of heart disease.  Improve your blood pressure.  Reach or maintain a healthy weight. Every person with diabetes is different, and each person has different needs for a meal plan. Your health care provider may recommend that you work with a diet and nutrition specialist (dietitian) to make a meal plan that is best for you. Your meal plan may vary depending on factors such as:  The calories you need.  The medicines you take.  Your weight.  Your blood glucose, blood pressure, and cholesterol levels.  Your activity level.  Other health conditions you have, such as heart or kidney disease. How do carbohydrates affect me? Carbohydrates, also called carbs, affect your blood glucose level more than any other type of food. Eating carbs naturally raises the amount of glucose in your blood. Carb counting is a method for keeping track of how many carbs you eat. Counting carbs is important to keep your blood glucose at a healthy level, especially if you use insulin or take certain oral diabetes medicines. It is important to know how many carbs you can safely have in each meal. This is different for every person. Your dietitian can help you calculate how many carbs you should have at each meal and for each snack. Foods that contain carbs include:  Bread, cereal, rice, pasta, and crackers.  Potatoes and corn.  Peas, beans, and lentils.  Milk and yogurt.  Fruit and juice.  Desserts, such as cakes, cookies, ice cream, and candy. How does alcohol affect me? Alcohol can cause a sudden decrease in blood glucose (hypoglycemia),  especially if you use insulin or take certain oral diabetes medicines. Hypoglycemia can be a life-threatening condition. Symptoms of hypoglycemia (sleepiness, dizziness, and confusion) are similar to symptoms of having too much alcohol. If your health care provider says that alcohol is safe for you, follow these guidelines:  Limit alcohol intake to no more than 1 drink per day for nonpregnant women and 2 drinks per day for men. One drink equals 12 oz of beer, 5 oz of wine, or 1 oz of hard liquor.  Do not drink on an empty stomach.  Keep yourself hydrated with water, diet soda, or unsweetened iced tea.  Keep in mind that regular soda, juice, and other mixers may contain a lot of sugar and must be counted as carbs. What are tips for following this plan?  Reading food labels  Start by checking the serving size on the "Nutrition Facts" label of packaged foods and drinks. The amount of calories, carbs, fats, and other nutrients listed on the label is based on one serving of the item. Many items contain more than one serving per package.  Check the total grams (g) of carbs in one serving. You can calculate the number of servings of carbs in one serving by dividing the total carbs by 15. For example, if a food has 30 g of total carbs, it would be equal to 2 servings of carbs.  Check the number of grams (g) of saturated and trans fats in one serving. Choose foods that have low or no amount of these fats.  Check the number of  milligrams (mg) of salt (sodium) in one serving. Most people should limit total sodium intake to less than 2,300 mg per day.  Always check the nutrition information of foods labeled as "low-fat" or "nonfat". These foods may be higher in added sugar or refined carbs and should be avoided.  Talk to your dietitian to identify your daily goals for nutrients listed on the label. Shopping  Avoid buying canned, premade, or processed foods. These foods tend to be high in fat, sodium,  and added sugar.  Shop around the outside edge of the grocery store. This includes fresh fruits and vegetables, bulk grains, fresh meats, and fresh dairy. Cooking  Use low-heat cooking methods, such as baking, instead of high-heat cooking methods like deep frying.  Cook using healthy oils, such as olive, canola, or sunflower oil.  Avoid cooking with butter, cream, or high-fat meats. Meal planning  Eat meals and snacks regularly, preferably at the same times every day. Avoid going long periods of time without eating.  Eat foods high in fiber, such as fresh fruits, vegetables, beans, and whole grains. Talk to your dietitian about how many servings of carbs you can eat at each meal.  Eat 4-6 ounces (oz) of lean protein each day, such as lean meat, chicken, fish, eggs, or tofu. One oz of lean protein is equal to: ? 1 oz of meat, chicken, or fish. ? 1 egg. ?  cup of tofu.  Eat some foods each day that contain healthy fats, such as avocado, nuts, seeds, and fish. Lifestyle  Check your blood glucose regularly.  Exercise regularly as told by your health care provider. This may include: ? 150 minutes of moderate-intensity or vigorous-intensity exercise each week. This could be brisk walking, biking, or water aerobics. ? Stretching and doing strength exercises, such as yoga or weightlifting, at least 2 times a week.  Take medicines as told by your health care provider.  Do not use any products that contain nicotine or tobacco, such as cigarettes and e-cigarettes. If you need help quitting, ask your health care provider.  Work with a Social worker or diabetes educator to identify strategies to manage stress and any emotional and social challenges. Questions to ask a health care provider  Do I need to meet with a diabetes educator?  Do I need to meet with a dietitian?  What number can I call if I have questions?  When are the best times to check my blood glucose? Where to find more  information:  American Diabetes Association: diabetes.org  Academy of Nutrition and Dietetics: www.eatright.[employer] of Diabetes and Digestive and Kidney Diseases (NIH): [employer] Summary  A healthy meal plan will help you control your blood glucose and maintain a healthy lifestyle.  Working with a diet and nutrition specialist (dietitian) can help you make a meal plan that is best for you.  Keep in mind that carbohydrates (carbs) and alcohol have immediate effects on your blood glucose levels. It is important to count carbs and to use alcohol carefully. This information is not intended to replace advice given to you by your health care provider. Make sure you discuss any questions you have with your health care provider. Document Released: 07/06/2005 Document Revised: 05/09/2017 Document Reviewed: 11/13/2016 Elsevier Interactive Patient Education  2019 Elsevier Inc. Vaginal Yeast infection, Adult  Vaginal yeast infection is a condition that causes vaginal discharge as well as soreness, swelling, and redness (inflammation) of the vagina. This is a common condition. Some women get this  infection frequently. What are the causes? This condition is caused by a change in the normal balance of the yeast (candida) and bacteria that live in the vagina. This change causes an overgrowth of yeast, which causes the inflammation. What increases the risk? The condition is more likely to develop in women who:  Take antibiotic medicines.  Have diabetes.  Take birth control pills.  Are pregnant.  Douche often.  Have a weak body defense system (immune system).  Have been taking steroid medicines for a long time.  Frequently wear tight clothing. What are the signs or symptoms? Symptoms of this condition include:  White, thick, creamy vaginal discharge.  Swelling, itching, redness, and irritation of the vagina. The lips of the vagina (vulva) may be affected as  well.  Pain or a burning feeling while urinating.  Pain during sex. How is this diagnosed? This condition is diagnosed based on:  Your medical history.  A physical exam.  A pelvic exam. Your health care provider will examine a sample of your vaginal discharge under a microscope. Your health care provider may send this sample for testing to confirm the diagnosis. How is this treated? This condition is treated with medicine. Medicines may be over-the-counter or prescription. You may be told to use one or more of the following:  Medicine that is taken by mouth (orally).  Medicine that is applied as a cream (topically).  Medicine that is inserted directly into the vagina (suppository). Follow these instructions at home:  Lifestyle  Do not have sex until your health care provider approves. Tell your sex partner that you have a yeast infection. That person should go to his or her health care provider and ask if they should also be treated.  Do not wear tight clothes, such as pantyhose or tight pants.  Wear breathable cotton underwear. General instructions  Take or apply over-the-counter and prescription medicines only as told by your health care provider.  Eat more yogurt. This may help to keep your yeast infection from returning.  Do not use tampons until your health care provider approves.  Try taking a sitz bath to help with discomfort. This is a warm water bath that is taken while you are sitting down. The water should only come up to your hips and should cover your buttocks. Do this 3-4 times per day or as told by your health care provider.  Do not douche.  If you have diabetes, keep your blood sugar levels under control.  Keep all follow-up visits as told by your health care provider. This is important. Contact a health care provider if:  You have a fever.  Your symptoms go away and then return.  Your symptoms do not get better with treatment.  Your symptoms get  worse.  You have new symptoms.  You develop blisters in or around your vagina.  You have blood coming from your vagina and it is not your menstrual period.  You develop pain in your abdomen. Summary  Vaginal yeast infection is a condition that causes discharge as well as soreness, swelling, and redness (inflammation) of the vagina.  This condition is treated with medicine. Medicines may be over-the-counter or prescription.  Take or apply over-the-counter and prescription medicines only as told by your health care provider.  Do not douche. Do not have sex or use tampons until your health care provider approves.  Contact a health care provider if your symptoms do not get better with treatment or your symptoms go away and  then return. This information is not intended to replace advice given to you by your health care provider. Make sure you discuss any questions you have with your health care provider. Document Released: 07/19/2005 Document Revised: 02/25/2018 Document Reviewed: 02/25/2018 Elsevier Interactive Patient Education  2019 [employer].

## 2018-12-04 IMAGING — US US MFM FETAL BPP W/O NON-STRESS
1 series · 14 of 28 positions shown · non-contrast
Comparison: none

[Series 1: us mfm fetal bpp w/o non-stress · 39 acquisitions, 14 frames shown]
[im 2/39]
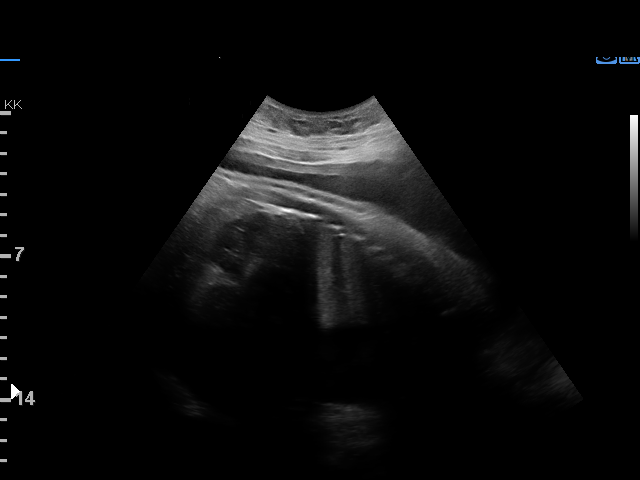
[im 5/39]
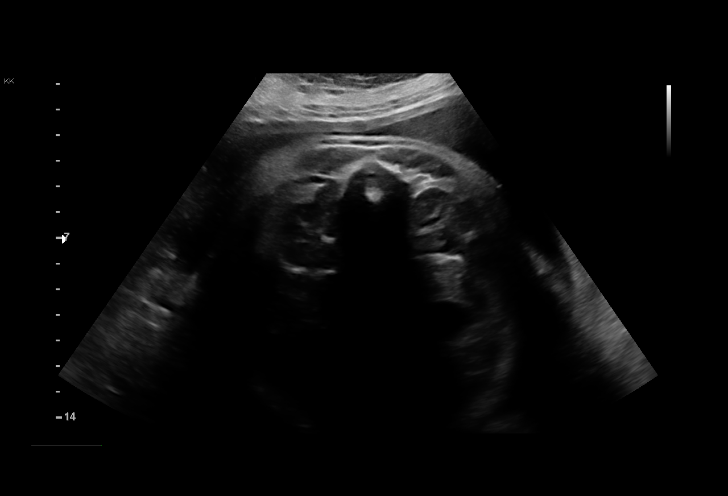
[im 8/39]
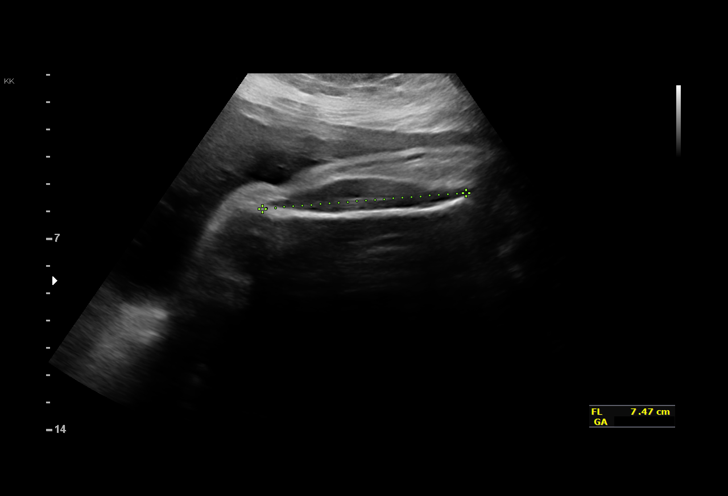
[im 10/39]
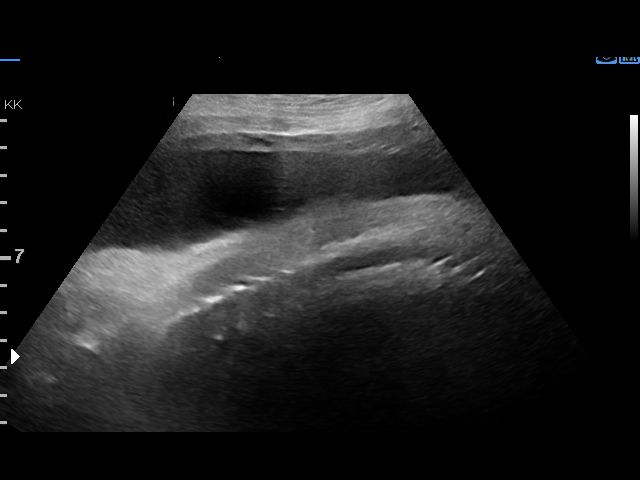
[im 13/39]
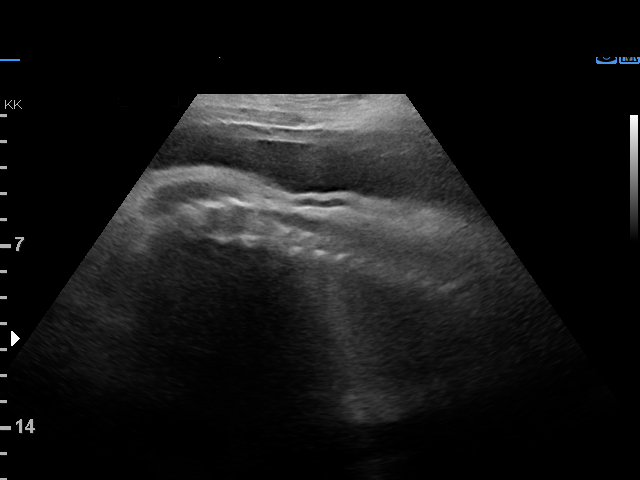
[im 16/39]
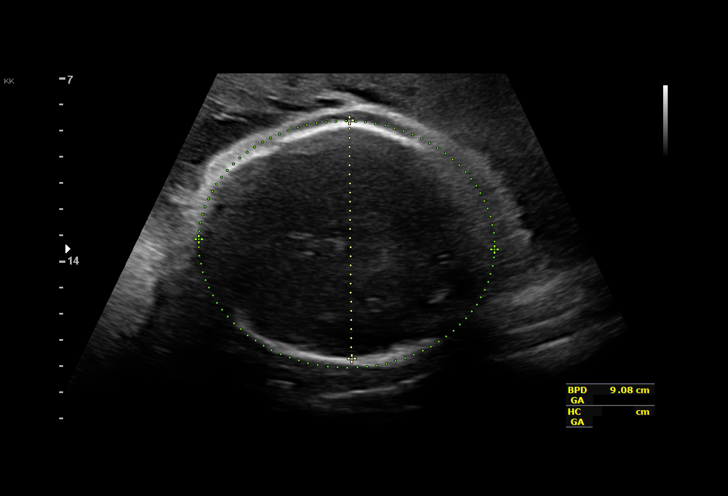
[im 19/39]
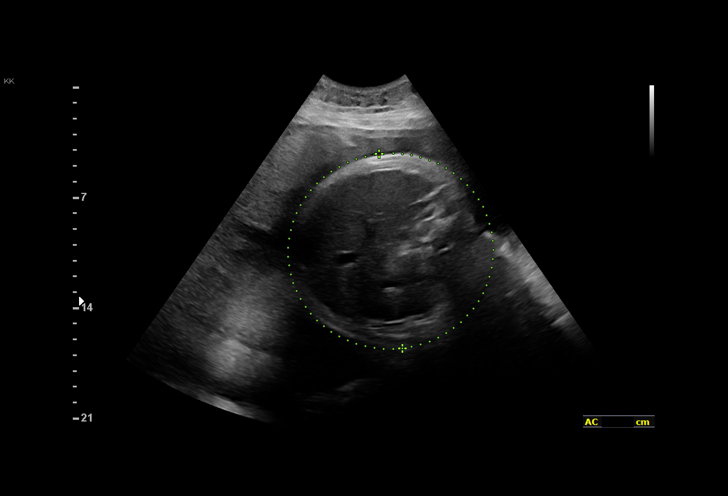
[im 22/39]
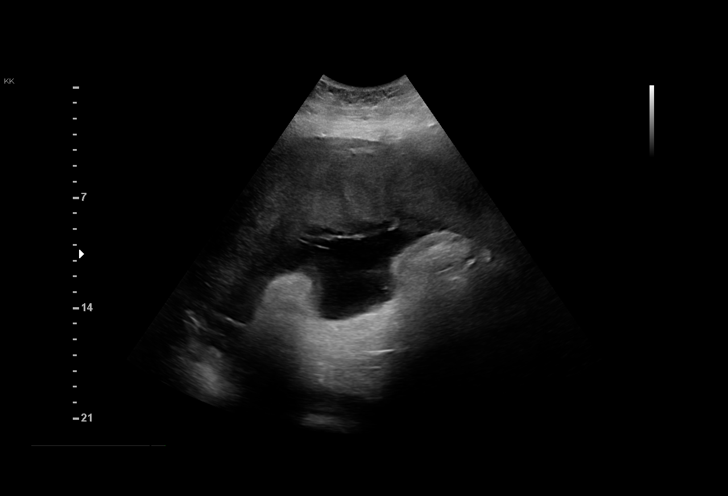
[im 24/39]
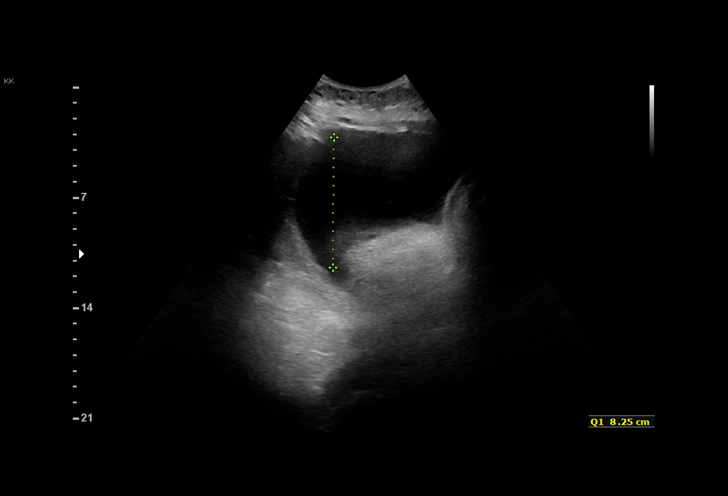
[im 27/39]
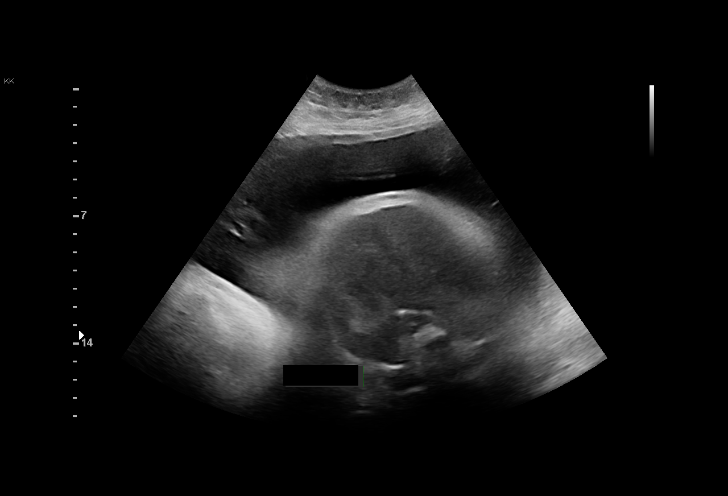
[im 30/39]
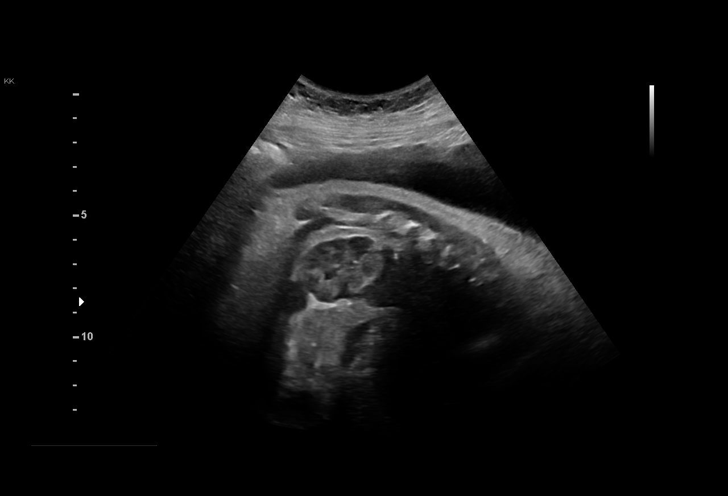
[im 33/39]
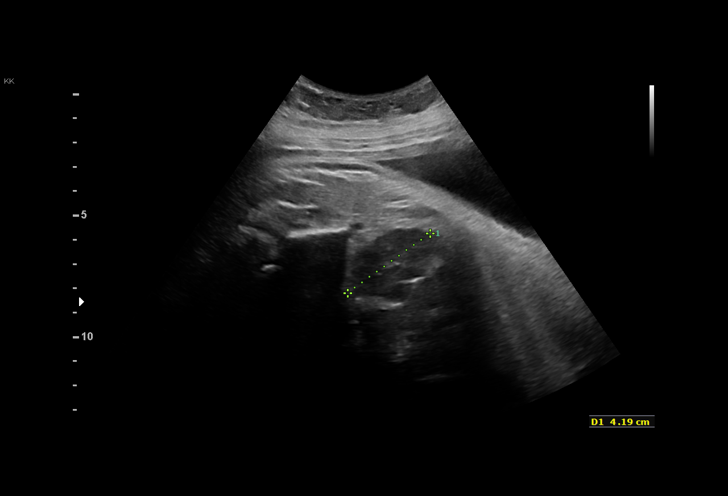
[im 36/39]
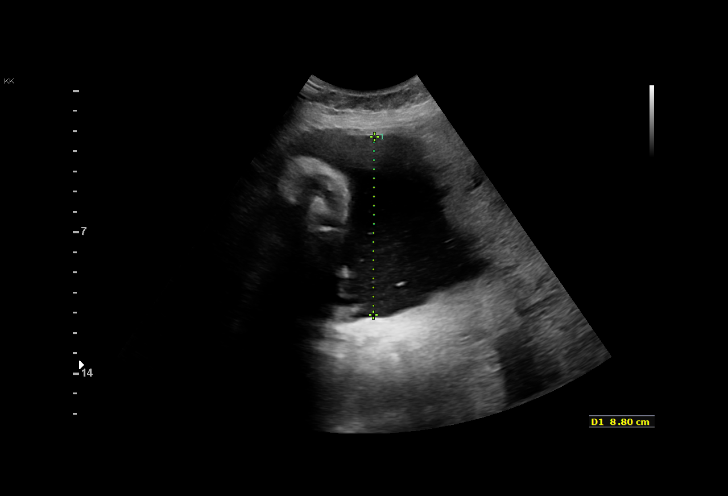
[im 39/39]
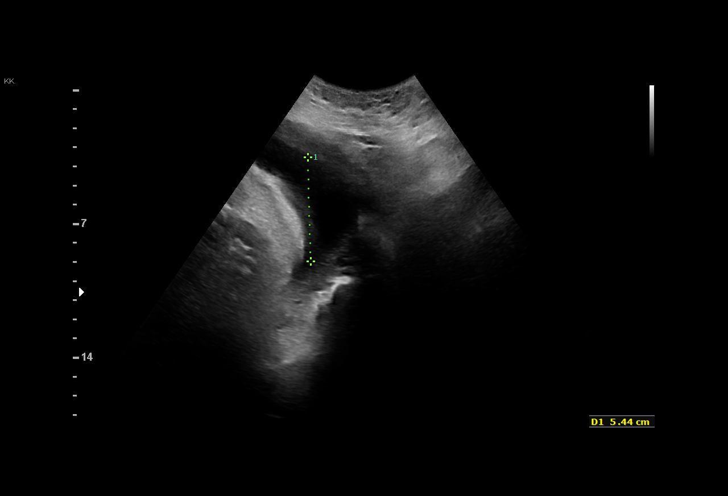

[14 of 28 positions shown; findings below may reference images not displayed]

1  BADRA ALI BOTELLA            [id number]      [id number]     [id number]
2  HERMENA MORADO           [id number]      [id number]     [id number]
Indications

36 weeks gestation of pregnancy
History of cesarean delivery x 2, currently
pregnant
Obesity complicating pregnancy, third
trimester
Pre-existing diabetes, type 2, in pregnancy,
third trimester on metformin and glyburide
Poor obstetric history: Prior fetal
macrosomia, antepartum
Gestational hypertension without significant
proteinuria, third trimester
Polyhydramnios, third trimester, antepartum
condition or complication, unspecified fetus
Fetal Evaluation

Num Of Fetuses:     1
Fetal Heart         142
Rate(bpm):
Cardiac Activity:   Observed
Presentation:       Cephalic
Placenta:           Anterior, above cervical os
P. Cord Insertion:  Previously Visualized

Amniotic Fluid
AFI FV:      Polyhydramnios - moderate

AFI Sum(cm)     %Tile       Largest Pocket(cm)
30.41           > 97

RUQ(cm)       RLQ(cm)       LUQ(cm)        LLQ(cm)
8.25
Biophysical Evaluation

Amniotic F.V:   Polyhydramnios             F. Tone:        Observed
F. Movement:    Observed                   Score:          [DATE]
F. Breathing:   Observed
Biometry

BPD:      91.2  mm     G. Age:  37w 0d         79  %    CI:        77.73   %    70 - 86
FL/HC:      23.0   %    20.1 -
HC:      327.4  mm     G. Age:  37w 1d         42  %    HC/AC:      0.81        0.93 -
AC:      404.5  mm     G. Age:  N/A          > 97  %    FL/BPD:     82.7   %    71 - 87
FL:       75.4  mm     G. Age:  38w 4d         92  %    FL/AC:      18.6   %    20 - 24

Est. FW:    8814  gm    9 lb 11 oz    > 90  %
Gestational Age

LMP:           36w 2d        Date:  03/10/17                 EDD:   12/15/17
U/S Today:     37w 4d                                        EDD:   12/06/17
Best:          36w 2d     Det. By:  LMP  (03/10/17)          EDD:   12/15/17
Anatomy

Cranium:               Appears normal         Aortic Arch:            Previously seen
Cavum:                 Previously seen        Ductal Arch:            Previously seen
Ventricles:            Previously seen        Diaphragm:              Previously seen
Choroid Plexus:        Previously seen        Stomach:                Previously Seen
Cerebellum:            Previously seen        Abdomen:                Previously seen
Posterior Fossa:       Previously seen        Abdominal Wall:         Previously seen
Nuchal Fold:           Not applicable (>20    Cord Vessels:           Previously seen
wks GA)
Face:                  Profile not well       Kidneys:                Appear normal
visualized
Lips:                  Previously seen        Bladder:                Appears normal
Thoracic:              Appears normal         Spine:                  Previously seen
Heart:                 Previously seen        Upper Extremities:      Previously seen
RVOT:                  Previously seen        Lower Extremities:      Previously seen
LVOT:                  Previously seen

Other:  Fetus appears to be a female. Technically difficult due to maternal
habitus and fetal position.
Cervix Uterus Adnexa

Cervix
Not visualized (advanced GA >18wks)
Impression

SIUP at 36+2 weeks with cardiac activity
Normal interval anatomy; anatomic survey complete except
for profile
Moderate polyhydramnios
EFW > 90th %tile; AC > 97th %tile; 8814 grams; 9+11
BPP [DATE]
Recommendations

Continue antenatal testing.

## 2018-12-15 ENCOUNTER — Emergency Department (HOSPITAL_COMMUNITY)
Admission: EM | Admit: 2018-12-15 | Discharge: 2018-12-16 | Disposition: A | Discharge status: Home / Self Care | Payer: Medicaid Other | Attending: Emergency Medicine | Admitting: Emergency Medicine

## 2018-12-15 ENCOUNTER — Other Ambulatory Visit: Payer: Self-pay

## 2018-12-15 ENCOUNTER — Encounter (HOSPITAL_COMMUNITY): Payer: Self-pay

## 2018-12-15 DIAGNOSIS — Z87891 Personal history of nicotine dependence: Secondary | ICD-10-CM | POA: Diagnosis not present

## 2018-12-15 DIAGNOSIS — F419 Anxiety disorder, unspecified: Secondary | ICD-10-CM | POA: Insufficient documentation

## 2018-12-15 DIAGNOSIS — R51 Headache: Secondary | ICD-10-CM | POA: Diagnosis present

## 2018-12-15 DIAGNOSIS — Z79899 Other long term (current) drug therapy: Secondary | ICD-10-CM | POA: Insufficient documentation

## 2018-12-15 DIAGNOSIS — E1165 Type 2 diabetes mellitus with hyperglycemia: Secondary | ICD-10-CM | POA: Insufficient documentation

## 2018-12-15 DIAGNOSIS — I10 Essential (primary) hypertension: Secondary | ICD-10-CM | POA: Diagnosis not present

## 2018-12-15 DIAGNOSIS — N760 Acute vaginitis: Secondary | ICD-10-CM

## 2018-12-15 DIAGNOSIS — R102 Pelvic and perineal pain: Secondary | ICD-10-CM | POA: Diagnosis not present

## 2018-12-15 DIAGNOSIS — Z794 Long term (current) use of insulin: Secondary | ICD-10-CM | POA: Insufficient documentation

## 2018-12-15 DIAGNOSIS — R739 Hyperglycemia, unspecified: Secondary | ICD-10-CM

## 2018-12-15 DIAGNOSIS — N898 Other specified noninflammatory disorders of vagina: Secondary | ICD-10-CM

## 2018-12-15 DIAGNOSIS — B9689 Other specified bacterial agents as the cause of diseases classified elsewhere: Secondary | ICD-10-CM

## 2018-12-15 LAB — CBC
HCT: 40.6 % (ref 36.0–46.0)
Hemoglobin: 12.7 g/dL (ref 12.0–15.0)
MCH: 27.1 pg (ref 26.0–34.0)
MCHC: 31.3 g/dL (ref 30.0–36.0)
MCV: 86.6 fL (ref 80.0–100.0)
Platelets: 307 10*3/uL (ref 150–400)
RBC: 4.69 MIL/uL (ref 3.87–5.11)
RDW: 14.6 % (ref 11.5–15.5)
WBC: 6.4 10*3/uL (ref 4.0–10.5)
nRBC: 0 % (ref 0.0–0.2)

## 2018-12-15 LAB — URINALYSIS, ROUTINE W REFLEX MICROSCOPIC
BILIRUBIN URINE: NEGATIVE
Bacteria, UA: NONE SEEN
Glucose, UA: >=500 mg/dL — AB
Hgb urine dipstick: NEGATIVE
Ketones, ur: 5 mg/dL — AB
NITRITE: NEGATIVE
Protein, ur: NEGATIVE mg/dL
Specific Gravity, Urine: 1.025 (ref 1.005–1.030)
pH: 5 (ref 5.0–8.0)

## 2018-12-15 LAB — BASIC METABOLIC PANEL
Anion gap: 14 (ref 5–15)
BUN: 16 mg/dL (ref 6–20)
CO2: 20 mmol/L — ABNORMAL LOW (ref 22–32)
Calcium: 8.8 mg/dL — ABNORMAL LOW (ref 8.9–10.3)
Chloride: 91 mmol/L — ABNORMAL LOW (ref 98–111)
Creatinine, Ser: 1.01 mg/dL — ABNORMAL HIGH (ref 0.44–1.00)
GFR calc Af Amer: >60 mL/min (ref >60)
GFR calc non Af Amer: >60 mL/min (ref >60)
Glucose, Bld: 763 mg/dL (ref 70–99)
Potassium: 4.4 mmol/L (ref 3.5–5.1)
Sodium: 125 mmol/L — ABNORMAL LOW (ref 135–145)

## 2018-12-15 LAB — CBG MONITORING, ED
Glucose-Capillary: 547 mg/dL (ref 70–99)
Glucose-Capillary: >600 mg/dL (ref 70–99)

## 2018-12-15 LAB — I-STAT BETA HCG BLOOD, ED (MC, WL, AP ONLY): I-stat hCG, quantitative: <5 m[IU]/mL (ref <5)

## 2018-12-15 MED ORDER — SODIUM CHLORIDE 0.9 % IV SOLN: INTRAVENOUS | Status: DC

## 2018-12-15 MED ORDER — INSULIN REGULAR(HUMAN) IN NACL 100-0.9 UT/100ML-% IV SOLN
INTRAVENOUS | Status: DC
  Administered 2018-12-15: 5.4 [IU]/h via INTRAVENOUS
  Filled 2018-12-15: qty 100

## 2018-12-15 MED ORDER — FENTANYL CITRATE (PF) 100 MCG/2ML IJ SOLN
100.0000 ug | Freq: Once | INTRAMUSCULAR | Status: AC
  Administered 2018-12-15: 100 ug via INTRAVENOUS
  Filled 2018-12-15: qty 2

## 2018-12-15 MED ORDER — DEXTROSE-NACL 5-0.45 % IV SOLN
INTRAVENOUS | Status: DC
  Administered 2018-12-16: 02:00:00 via INTRAVENOUS

## 2018-12-15 MED ORDER — SODIUM CHLORIDE 0.9 % IV BOLUS
1000.0000 mL | Freq: Once | INTRAVENOUS | Status: AC
  Administered 2018-12-16: 1000 mL via INTRAVENOUS

## 2018-12-15 MED ORDER — SODIUM CHLORIDE 0.9 % IV BOLUS
1000.0000 mL | Freq: Once | INTRAVENOUS | Status: AC
  Administered 2018-12-15: 1000 mL via INTRAVENOUS

## 2018-12-15 MED ORDER — ONDANSETRON HCL 4 MG/2ML IJ SOLN
4.0000 mg | Freq: Once | INTRAMUSCULAR | Status: AC
  Administered 2018-12-15: 4 mg via INTRAVENOUS
  Filled 2018-12-15: qty 2

## 2018-12-15 NOTE — ED Triage Notes (Signed)
Abdominal pain, headache x 2 days with nausea no vomiting or diarrhea and out of insulin for 4 days now has it refilled blood sugar per ems 590.

## 2018-12-15 NOTE — ED Provider Notes (Signed)
[facility] DEPT Provider Note   CSN: [id number] Arrival date & time: 12/15/18  2131    History   Chief Complaint Chief Complaint  Patient presents with  . Hyperglycemia    HPI Jodi Mcgee is a 33 y.o. female.     HPI   She presents for evaluation of known hyperglycemia based on labs done last week at her PCP office.  She also complains of headache and abdominal pain.  She has been on her insulin for 3 days, prior to that was off it for about a week and a half because she did not have the equipment to administer the medication.  She denies fever, chills, cough, weakness or dizziness.  She has had a couple episodes of both vomiting and diarrhea, each without blood.  She complains of vaginal yeast infection.  She is using over-the-counter medications without relief.  There are no other known modifying factors.  Past Medical History:  Diagnosis Date  . Anemia   . Anxiety   . Blood transfusion without reported diagnosis    both CS  . Diabetes mellitus without complication ([place])   . GERD (gastroesophageal reflux disease)    has resolved  . IBS (irritable bowel syndrome)   . Ovarian cyst   . Peritonitis, acute generalized ([place])   . Pulmonary embolism ([place])   . Sepsis [facility])     Patient Active Problem List   Diagnosis Date Noted  . Uncontrolled type 2 DM with hyperosmolar nonketotic hyperglycemia ([place]) 09/26/2018  . Hyponatremia 09/26/2018  . Uncontrolled type 2 diabetes mellitus with hyperosmolar nonketotic hyperglycemia ([place]) 09/26/2018  . History of cesarean section, classical 01/03/2018  . Chronic hypertension 01/03/2018  . IUD contraception 01/03/2018  . Postpartum care and examination 01/03/2018  . Pulmonary embolism during puerperium 12/04/2017  . Pulmonary edema 11/30/2017  . S/P cesarean section 11/21/2017  . Pelvic adhesive disease 10/22/2017  . Nasal septal perforation 10/17/2017  . History of cocaine abuse ([place]) 10/17/2017    . Epistaxis 10/17/2017  . 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 Obesity (BMI 30-39.9) 09/25/2017  . Hypertension in pregnancy, preeclampsia, severe, delivered/postpartum 09/13/2017  . Previous cesarean section complicating pregnancy 38/07/1750  . Type 2 diabetes mellitus, without long-term current use of insulin ([place]) 08/23/2017  . Chronic pain syndrome 08/23/2017    Past Surgical History:  Procedure Laterality Date  . APPENDECTOMY     ruptured   . BUNIONECTOMY    . CESAREAN SECTION    . CESAREAN SECTION Bilateral 11/20/2017   Procedure: CESAREAN SECTION;  Surgeon: Sloan Leiter, MD;  Location: [facility];  Service: Obstetrics;  Laterality: Bilateral;  . OVARIAN CYST DRAINAGE    . SMALL BOWEL REPAIR       OB History    Gravida  3   Para  3   Term  1   Preterm  2   AB  0   Living  3     SAB  0   TAB  0   Ectopic  0   Multiple  0   Live Births  3            Home Medications    Prior to Admission medications   Medication Sig Start Date End Date Taking? Authorizing Provider  acetaminophen (TYLENOL) 500 MG tablet Take 1,000 mg by mouth daily as needed for moderate pain.   Yes [provider]  blood glucose meter kit and supplies KIT Dispense based on  patient and insurance preference. Use up to four times daily as directed. (FOR ICD-9 250.00, 250.01). 09/27/18  Yes Charlynne Cousins, MD  glipiZIDE (GLUCOTROL) 5 MG tablet Take 1 tablet (5 mg total) by mouth 2 (two) times daily before a meal. 09/27/18  Yes Charlynne Cousins, MD  insulin detemir (LEVEMIR) 100 unit/ml SOLN Inject 0.2 mLs (20 Units total) into the skin daily. Patient taking differently: Inject 20 Units into the skin 2 (two) times daily.  09/27/18  Yes Charlynne Cousins, MD  Levonorgestrel (LILETTA) 19.5 MCG/DAY IUD IUD 1 each by Intrauterine route once.   Yes [provider]  metFORMIN (GLUCOPHAGE) 1000 MG tablet Take 1 tablet (1,000 mg total) by mouth 2  (two) times daily with a meal. 09/12/18  Yes Recardo Evangelist, PA-C  fluconazole (DIFLUCAN) 150 MG tablet Take one pill on Day 1 and one pill on Day 3. Patient not taking: Reported on 12/15/2018 11/13/18   Starr Lake, CNM  terconazole (TERAZOL 7) 0.4 % vaginal cream Place 1 applicator vaginally at bedtime. Patient not taking: Reported on 12/15/2018 11/13/18   Starr Lake, CNM    Family History Family History  Problem Relation Age of Onset  . Hypertension Mother   . Anemia Mother   . Diabetes Father     Social History Social History   Tobacco Use  . Smoking status: Former Smoker    Packs/day: 0.50    Types: Cigarettes  . Smokeless tobacco: Never Used  Substance Use Topics  . Alcohol use: Not Currently  . Drug use: Not Currently     Allergies   Cetirizine & related; Toradol [ketorolac tromethamine]; Flagyl [metronidazole]; Contrast media [iodinated diagnostic agents]; and Nsaids   Review of Systems Review of Systems  All other systems reviewed and are negative.    Physical Exam Updated Vital Signs BP 129/80   Pulse (!) 104   Temp 98.2 F (36.8 C) (Oral)   Resp 16   Ht '5\' 3"'  (1.6 m)   Wt 103.9 kg   SpO2 98%   BMI 40.58 kg/m   Physical Exam Vitals signs and nursing note reviewed.  Constitutional:      General: She is not in acute distress.    Appearance: She is well-developed. She is obese. She is not ill-appearing, toxic-appearing or diaphoretic.  HENT:     Head: Normocephalic and atraumatic.     Right Ear: External ear normal.     Left Ear: External ear normal.  Eyes:     Conjunctiva/sclera: Conjunctivae normal.     Pupils: Pupils are equal, round, and reactive to light.  Neck:     Musculoskeletal: Normal range of motion and neck supple.     Trachea: Phonation normal.  Cardiovascular:     Rate and Rhythm: Normal rate and regular rhythm.     Heart sounds: Normal heart sounds.  Pulmonary:     Effort: Pulmonary effort is  normal.     Breath sounds: Normal breath sounds.  Abdominal:     General: There is no distension.     Palpations: Abdomen is soft. There is no mass.     Tenderness: There is abdominal tenderness (Periumbilical mild). There is no guarding or rebound.     Hernia: No hernia is present.  Genitourinary:    Comments: Normal external female genitalia.  Small amount of blood in vaginal vault consistent with uterine bleeding.  Cervix is eccentrically located to the right.  Cervix is nontender to palpation.  No palpable pelvic organ enlargement. Musculoskeletal: Normal range of motion.  Skin:    General: Skin is warm and dry.  Neurological:     Mental Status: She is alert and oriented to person, place, and time.     Cranial Nerves: No cranial nerve deficit.     Sensory: No sensory deficit.     Motor: No abnormal muscle tone.     Coordination: Coordination normal.     Comments: No dysarthria or aphasia  Psychiatric:        Mood and Affect: Mood normal.        Behavior: Behavior normal.        Thought Content: Thought content normal.        Judgment: Judgment normal.      ED Treatments / Results  Labs (all labs ordered are listed, but only abnormal results are displayed) Labs Reviewed  WET PREP, GENITAL - Abnormal; Notable for the following components:      Result Value   Clue Cells Wet Prep HPF POC PRESENT (*)    WBC, Wet Prep HPF POC MODERATE (*)    All other components within normal limits  BASIC METABOLIC PANEL - Abnormal; Notable for the following components:   Sodium 125 (*)    Chloride 91 (*)    CO2 20 (*)    Glucose, Bld 763 (*)    Creatinine, Ser 1.01 (*)    Calcium 8.8 (*)    All other components within normal limits  URINALYSIS, ROUTINE W REFLEX MICROSCOPIC - Abnormal; Notable for the following components:   Color, Urine COLORLESS (*)    Glucose, UA >=500 (*)    Ketones, ur 5 (*)    Leukocytes,Ua TRACE (*)    All other components within normal limits  CBG MONITORING,  ED - Abnormal; Notable for the following components:   Glucose-Capillary >600 (*)    All other components within normal limits  CBG MONITORING, ED - Abnormal; Notable for the following components:   Glucose-Capillary 547 (*)    All other components within normal limits  CBC  RPR  HIV ANTIBODY (ROUTINE TESTING W REFLEX)  I-STAT BETA HCG BLOOD, ED ([facility], [facility], [facility] ONLY)  GC/CHLAMYDIA PROBE AMP ([place]) NOT AT [facility]    EKG None  Radiology No results found.  Procedures .Critical Care Performed by: Daleen Bo, MD Authorized by: Daleen Bo, MD   Critical care provider statement:    Critical care time (minutes):  35   Critical care start time:  12/15/2018 10:00 PM   Critical care end time:  12/16/2018 12:06 AM   Critical care time was exclusive of:  Separately billable procedures and treating other patients   Critical care was time spent personally by me on the following activities:  Blood draw for specimens, development of treatment plan with patient or surrogate, discussions with consultants, evaluation of patient's response to treatment, examination of patient, obtaining history from patient or surrogate, ordering and performing treatments and interventions, ordering and review of laboratory studies, pulse oximetry, re-evaluation of patient's condition, review of old charts and ordering and review of radiographic studies   (including critical care time)  Medications Ordered in ED Medications  dextrose 5 %-0.45 % sodium chloride infusion ( Intravenous Hold 12/15/18 2229)  insulin regular, human (MYXREDLIN) 100 units/ 100 mL infusion (5.4 Units/hr Intravenous New Bag/Given 12/15/18 2245)  sodium chloride 0.9 % bolus 1,000 mL (0 mLs Intravenous Stopped 12/15/18 2348)    And  sodium chloride 0.9 % bolus  1,000 mL (1,000 mLs Intravenous New Bag/Given 12/15/18 2349)    And  sodium chloride 0.9 % bolus 1,000 mL (has no administration in time range)    And  0.9 %  sodium chloride  infusion (has no administration in time range)  fentaNYL (SUBLIMAZE) injection 100 mcg (100 mcg Intravenous Given 12/15/18 2315)  ondansetron (ZOFRAN) injection 4 mg (4 mg Intravenous Given 12/15/18 2315)     Initial Impression / Assessment and Plan / ED Course  I have reviewed the triage vital signs and the nursing notes.  Pertinent labs & imaging results that were available during my care of the patient were reviewed by me and considered in my medical decision making (see chart for details).  Clinical Course as of Dec 16 5  Mon Dec 16, 2018  0001 Normal except increased glucose, ketones and leukocytes  Urinalysis, Routine w reflex microscopic(!) [EW]  0004 Normal  I-Stat beta hCG blood, ED [EW]  0004 Normal except sodium low, chloride low, CO2 low, glucose high, creatinine high, calcium low  Basic metabolic panel(!!) [EW]    Clinical Course User Index [EW] Daleen Bo, MD        Patient Vitals for the past 24 hrs:  BP Temp Temp src Pulse Resp SpO2 Height Weight  12/15/18 2300 129/80 - - (!) 104 16 98 % - -  12/15/18 2141 131/83 98.2 F (36.8 C) Oral (!) 110 18 100 % - -  12/15/18 2138 - - - - - - '5\' 3"'  (1.6 m) 103.9 kg    12:03 AM Reevaluation with update and discussion. After initial assessment and treatment, an updated evaluation reveals no additional complaints, currently being treated with IV insulin.  Patient updated on findings and plan. Daleen Bo   Medical Decision Making: Hyperglycemia with medication noncompliance.  She has been on insulin now for 3 days.  Anion gap 14.  Nonspecific vaginal discomfort.  CRITICAL CARE-yes Performed by: Daleen Bo  Nursing Notes Reviewed/ Care Coordinated Applicable Imaging Reviewed Interpretation of Laboratory Data incorporated into ED treatment   Plan-disposition per oncoming treatment team after completion of treatment.  Final Clinical Impressions(s) / ED Diagnoses   Final diagnoses:  Hyperglycemia  Vaginal  itching    ED Discharge Orders    None       Daleen Bo, MD 12/16/18 [phone]

## 2018-12-16 LAB — WET PREP, GENITAL
SPERM: NONE SEEN
Trich, Wet Prep: NONE SEEN
Yeast Wet Prep HPF POC: NONE SEEN

## 2018-12-16 LAB — GC/CHLAMYDIA PROBE AMP (~~LOC~~) NOT AT ARMC
Chlamydia: NEGATIVE
Neisseria Gonorrhea: NEGATIVE

## 2018-12-16 LAB — RPR: RPR Ser Ql: NONREACTIVE

## 2018-12-16 LAB — CBG MONITORING, ED
GLUCOSE-CAPILLARY: 335 mg/dL — AB (ref 70–99)
Glucose-Capillary: 202 mg/dL — ABNORMAL HIGH (ref 70–99)
Glucose-Capillary: 143 mg/dL — ABNORMAL HIGH (ref 70–99)

## 2018-12-16 LAB — HIV ANTIBODY (ROUTINE TESTING W REFLEX): HIV Screen 4th Generation wRfx: NONREACTIVE

## 2018-12-16 MED ORDER — CLINDAMYCIN PHOSPHATE 2 % VA CREA: 1.0000 | TOPICAL_CREAM | Freq: Every day | VAGINAL | 0 refills | Status: DC

## 2018-12-16 MED ORDER — DIPHENHYDRAMINE HCL 50 MG/ML IJ SOLN
25.0000 mg | Freq: Once | INTRAMUSCULAR | Status: AC
  Administered 2018-12-16: 25 mg via INTRAVENOUS
  Filled 2018-12-16: qty 1

## 2018-12-16 MED ORDER — FENTANYL CITRATE (PF) 100 MCG/2ML IJ SOLN
100.0000 ug | Freq: Once | INTRAMUSCULAR | Status: AC
  Administered 2018-12-16: 100 ug via INTRAVENOUS
  Filled 2018-12-16: qty 2

## 2018-12-16 NOTE — ED Provider Notes (Signed)
Care assumed from Dr. Eulis Foster, patient presented with hyperglycemia, also with vaginal discharge.  Wet prep did show evidence of bacterial vaginosis.  She is allergic to metronidazole, so is given a prescription for clindamycin cream.  With insulin and fluids, blood glucose is come down to 202.  Patient states that she just had her prescriptions refilled 2 days ago, but had been out of them for some time because of problems with the prescription.  She is advised to make sure she takes her medication daily and advised to monitor her glucose at home.  Results for orders placed or performed during the hospital encounter of 12/15/18  Wet prep, genital  Result Value Ref Range   Yeast Wet Prep HPF POC NONE SEEN NONE SEEN   Trich, Wet Prep NONE SEEN NONE SEEN   Clue Cells Wet Prep HPF POC PRESENT (A) NONE SEEN   WBC, Wet Prep HPF POC MODERATE (A) NONE SEEN   Sperm NONE SEEN   Basic metabolic panel  Result Value Ref Range   Sodium 125 (L) 135 - 145 mmol/L   Potassium 4.4 3.5 - 5.1 mmol/L   Chloride 91 (L) 98 - 111 mmol/L   CO2 20 (L) 22 - 32 mmol/L   Glucose, Bld 763 (HH) 70 - 99 mg/dL   BUN 16 6 - 20 mg/dL   Creatinine, Ser 1.01 (H) 0.44 - 1.00 mg/dL   Calcium 8.8 (L) 8.9 - 10.3 mg/dL   GFR calc non Af Amer >60 >60 mL/min   GFR calc Af Amer >60 >60 mL/min   Anion gap 14 5 - 15  CBC  Result Value Ref Range   WBC 6.4 4.0 - 10.5 K/uL   RBC 4.69 3.87 - 5.11 MIL/uL   Hemoglobin 12.7 12.0 - 15.0 g/dL   HCT 40.6 36.0 - 46.0 %   MCV 86.6 80.0 - 100.0 fL   MCH 27.1 26.0 - 34.0 pg   MCHC 31.3 30.0 - 36.0 g/dL   RDW 14.6 11.5 - 15.5 %   Platelets 307 150 - 400 K/uL   nRBC 0.0 0.0 - 0.2 %  Urinalysis, Routine w reflex microscopic  Result Value Ref Range   Color, Urine COLORLESS (A) YELLOW   APPearance CLEAR CLEAR   Specific Gravity, Urine 1.025 1.005 - 1.030   pH 5.0 5.0 - 8.0   Glucose, UA >=500 (A) NEGATIVE mg/dL   Hgb urine dipstick NEGATIVE NEGATIVE   Bilirubin Urine NEGATIVE NEGATIVE   Ketones, ur 5 (A) NEGATIVE mg/dL   Protein, ur NEGATIVE NEGATIVE mg/dL   Nitrite NEGATIVE NEGATIVE   Leukocytes,Ua TRACE (A) NEGATIVE   RBC / HPF 0-5 0 - 5 RBC/hpf   WBC, UA 0-5 0 - 5 WBC/hpf   Bacteria, UA NONE SEEN NONE SEEN   Squamous Epithelial / LPF 0-5 0 - 5  CBG monitoring, ED  Result Value Ref Range   Glucose-Capillary >600 (HH) 70 - 99 mg/dL  I-Stat beta hCG blood, ED  Result Value Ref Range   I-stat hCG, quantitative <5.0 <5 mIU/mL   Comment 3          CBG monitoring, ED  Result Value Ref Range   Glucose-Capillary 547 (HH) 70 - 99 mg/dL   Comment 1 Document in Chart   CBG monitoring, ED  Result Value Ref Range   Glucose-Capillary 335 (H) 70 - 99 mg/dL  CBG monitoring, ED  Result Value Ref Range   Glucose-Capillary 202 (H) 70 - 99 mg/dL  CBG monitoring, ED  Result Value Ref Range   Glucose-Capillary 143 (H) 70 - 99 mg/dL   Comment 1 Notify RN       Delora Fuel, MD 79/81/02 [phone]

## 2018-12-16 NOTE — Discharge Instructions (Addendum)
Make sure you take your diabetes medication every day.  Please monitor your blood sugar at home.

## 2018-12-27 ENCOUNTER — Encounter (HOSPITAL_COMMUNITY): Payer: Self-pay | Admitting: Emergency Medicine

## 2018-12-27 ENCOUNTER — Emergency Department (HOSPITAL_COMMUNITY): Payer: Medicaid Other

## 2018-12-27 ENCOUNTER — Emergency Department (HOSPITAL_COMMUNITY)
Admission: EM | Admit: 2018-12-27 | Discharge: 2018-12-28 | Disposition: A | Discharge status: Home / Self Care | Payer: Medicaid Other | Attending: Emergency Medicine | Admitting: Emergency Medicine

## 2018-12-27 ENCOUNTER — Emergency Department (HOSPITAL_COMMUNITY)
Admission: EM | Admit: 2018-12-27 | Discharge: 2018-12-27 | Disposition: A | Discharge status: Home / Self Care | Payer: Medicaid Other

## 2018-12-27 ENCOUNTER — Other Ambulatory Visit: Payer: Self-pay

## 2018-12-27 DIAGNOSIS — D140 Benign neoplasm of middle ear, nasal cavity and accessory sinuses: Secondary | ICD-10-CM | POA: Diagnosis not present

## 2018-12-27 DIAGNOSIS — Z794 Long term (current) use of insulin: Secondary | ICD-10-CM | POA: Insufficient documentation

## 2018-12-27 DIAGNOSIS — E1165 Type 2 diabetes mellitus with hyperglycemia: Secondary | ICD-10-CM | POA: Insufficient documentation

## 2018-12-27 DIAGNOSIS — R1084 Generalized abdominal pain: Secondary | ICD-10-CM | POA: Insufficient documentation

## 2018-12-27 DIAGNOSIS — I10 Essential (primary) hypertension: Secondary | ICD-10-CM | POA: Diagnosis not present

## 2018-12-27 DIAGNOSIS — Z87891 Personal history of nicotine dependence: Secondary | ICD-10-CM | POA: Diagnosis not present

## 2018-12-27 DIAGNOSIS — R631 Polydipsia: Secondary | ICD-10-CM | POA: Diagnosis present

## 2018-12-27 DIAGNOSIS — Z79899 Other long term (current) drug therapy: Secondary | ICD-10-CM | POA: Insufficient documentation

## 2018-12-27 DIAGNOSIS — R739 Hyperglycemia, unspecified: Secondary | ICD-10-CM

## 2018-12-27 LAB — URINALYSIS, ROUTINE W REFLEX MICROSCOPIC
Bilirubin Urine: NEGATIVE
Glucose, UA: >=500 mg/dL — AB
Hgb urine dipstick: NEGATIVE
Ketones, ur: 5 mg/dL — AB
Leukocytes,Ua: NEGATIVE
Nitrite: NEGATIVE
Protein, ur: NEGATIVE mg/dL
SPECIFIC GRAVITY, URINE: 1.026 (ref 1.005–1.030)
pH: 6 (ref 5.0–8.0)

## 2018-12-27 LAB — CBC
HCT: 40.2 % (ref 36.0–46.0)
Hemoglobin: 12.2 g/dL (ref 12.0–15.0)
MCH: 26.6 pg (ref 26.0–34.0)
MCHC: 30.3 g/dL (ref 30.0–36.0)
MCV: 87.8 fL (ref 80.0–100.0)
Platelets: 336 10*3/uL (ref 150–400)
RBC: 4.58 MIL/uL (ref 3.87–5.11)
RDW: 14.6 % (ref 11.5–15.5)
WBC: 6.2 10*3/uL (ref 4.0–10.5)
nRBC: 0 % (ref 0.0–0.2)

## 2018-12-27 LAB — CBG MONITORING, ED: Glucose-Capillary: >600 mg/dL (ref 70–99)

## 2018-12-27 LAB — COMPREHENSIVE METABOLIC PANEL
ALT: 17 U/L (ref 0–44)
AST: 15 U/L (ref 15–41)
Albumin: 3.7 g/dL (ref 3.5–5.0)
Alkaline Phosphatase: 90 U/L (ref 38–126)
Anion gap: 14 (ref 5–15)
BILIRUBIN TOTAL: 0.5 mg/dL (ref 0.3–1.2)
BUN: 9 mg/dL (ref 6–20)
CO2: 22 mmol/L (ref 22–32)
Calcium: 8.4 mg/dL — ABNORMAL LOW (ref 8.9–10.3)
Chloride: 89 mmol/L — ABNORMAL LOW (ref 98–111)
Creatinine, Ser: 0.94 mg/dL (ref 0.44–1.00)
GFR calc Af Amer: >60 mL/min (ref >60)
GFR calc non Af Amer: >60 mL/min (ref >60)
Glucose, Bld: 876 mg/dL (ref 70–99)
POTASSIUM: 3.7 mmol/L (ref 3.5–5.1)
Sodium: 125 mmol/L — ABNORMAL LOW (ref 135–145)
TOTAL PROTEIN: 7.4 g/dL (ref 6.5–8.1)

## 2018-12-27 LAB — I-STAT BETA HCG BLOOD, ED (MC, WL, AP ONLY): I-stat hCG, quantitative: <5 m[IU]/mL (ref <5)

## 2018-12-27 LAB — LIPASE, BLOOD: Lipase: 36 U/L (ref 11–51)

## 2018-12-27 MED ORDER — SODIUM CHLORIDE 0.9 % IV BOLUS
1000.0000 mL | Freq: Once | INTRAVENOUS | Status: AC
  Administered 2018-12-28: 1000 mL via INTRAVENOUS

## 2018-12-27 MED ORDER — INSULIN ASPART 100 UNIT/ML ~~LOC~~ SOLN
10.0000 [IU] | Freq: Once | SUBCUTANEOUS | Status: AC
  Administered 2018-12-27: 10 [IU] via SUBCUTANEOUS
  Filled 2018-12-27: qty 1

## 2018-12-27 MED ORDER — FAMOTIDINE IN NACL 20-0.9 MG/50ML-% IV SOLN
20.0000 mg | Freq: Once | INTRAVENOUS | Status: AC
  Administered 2018-12-27: 20 mg via INTRAVENOUS
  Filled 2018-12-27: qty 50

## 2018-12-27 MED ORDER — DIPHENHYDRAMINE HCL 50 MG/ML IJ SOLN
12.5000 mg | Freq: Once | INTRAMUSCULAR | Status: AC
  Administered 2018-12-27: 12.5 mg via INTRAVENOUS
  Filled 2018-12-27: qty 1

## 2018-12-27 MED ORDER — SODIUM CHLORIDE 0.9 % IV BOLUS
2000.0000 mL | Freq: Once | INTRAVENOUS | Status: AC
  Administered 2018-12-27: 2000 mL via INTRAVENOUS

## 2018-12-27 MED ORDER — FENTANYL CITRATE (PF) 100 MCG/2ML IJ SOLN
50.0000 ug | Freq: Once | INTRAMUSCULAR | Status: AC
  Administered 2018-12-27: 50 ug via INTRAVENOUS
  Filled 2018-12-27: qty 2

## 2018-12-27 MED ORDER — DICYCLOMINE HCL 10 MG/ML IM SOLN
20.0000 mg | Freq: Once | INTRAMUSCULAR | Status: AC
  Administered 2018-12-27: 20 mg via INTRAMUSCULAR
  Filled 2018-12-27: qty 2

## 2018-12-27 MED ORDER — INSULIN ASPART 100 UNIT/ML ~~LOC~~ SOLN
15.0000 [IU] | Freq: Once | SUBCUTANEOUS | Status: AC
  Administered 2018-12-28: 15 [IU] via SUBCUTANEOUS
  Filled 2018-12-27: qty 1

## 2018-12-27 NOTE — ED Notes (Signed)
Called for pt x3 with no answer. Registration advised the pt went back out to her vehicle to get her purse but never returned.

## 2018-12-27 NOTE — ED Provider Notes (Signed)
[place] DEPT Provider Note   CSN: [id number] Arrival date & time: 12/27/18  1819    History   Chief Complaint Chief Complaint  Patient presents with  . Abdominal Pain  . Urinary Frequency  . Diarrhea  . Hyperglycemia    HPI Jodi Mcgee is a 33 y.o. female with a past medical history of diabetes, GERD, IBS who presents to ED for evaluation of multiple complaints.  She reports abdominal pain radiating to her back, diffuse muscle spasms, polyuria and polydipsia, diarrhea since yesterday.  She has been taking her 20 units of Levemir every morning and every night but continues to have high readings on her glucometer at home.  She is concerned that her insulin regimen is not adequate.  She denies any sick contacts with similar symptoms.  She has not been taking medications to help with her symptoms.  Denies any vomiting, blood in her stool, URI symptoms, shortness of breath, fever.     HPI  Past Medical History:  Diagnosis Date  . Anemia   . Anxiety   . Blood transfusion without reported diagnosis    both CS  . Diabetes mellitus without complication ([place])   . GERD (gastroesophageal reflux disease)    has resolved  . IBS (irritable bowel syndrome)   . Ovarian cyst   . Peritonitis, acute generalized ([place])   . Pulmonary embolism ([place])   . Sepsis [facility])     Patient Active Problem List   Diagnosis Date Noted  . Uncontrolled type 2 DM with hyperosmolar nonketotic hyperglycemia ([place]) 09/26/2018  . Hyponatremia 09/26/2018  . Uncontrolled type 2 diabetes mellitus with hyperosmolar nonketotic hyperglycemia ([place]) 09/26/2018  . History of cesarean section, classical 01/03/2018  . Chronic hypertension 01/03/2018  . IUD contraception 01/03/2018  . Postpartum care and examination 01/03/2018  . Pulmonary embolism during puerperium 12/04/2017  . Pulmonary edema 11/30/2017  . S/P cesarean section 11/21/2017  . Pelvic adhesive disease 10/22/2017  .  Nasal septal perforation 10/17/2017  . History of cocaine abuse ([place]) 10/17/2017  . Epistaxis 10/17/2017  . 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 Obesity (BMI 30-39.9) 09/25/2017  . Hypertension in pregnancy, preeclampsia, severe, delivered/postpartum 09/13/2017  . Previous cesarean section complicating pregnancy 47/42/5956  . Type 2 diabetes mellitus, without long-term current use of insulin ([place]) 08/23/2017  . Chronic pain syndrome 08/23/2017    Past Surgical History:  Procedure Laterality Date  . APPENDECTOMY     ruptured   . BUNIONECTOMY    . CESAREAN SECTION    . CESAREAN SECTION Bilateral 11/20/2017   Procedure: CESAREAN SECTION;  Surgeon: Sloan Leiter, MD;  Location: [place];  Service: Obstetrics;  Laterality: Bilateral;  . OVARIAN CYST DRAINAGE    . SMALL BOWEL REPAIR       OB History    Gravida  3   Para  3   Term  1   Preterm  2   AB  0   Living  3     SAB  0   TAB  0   Ectopic  0   Multiple  0   Live Births  3            Home Medications    Prior to Admission medications   Medication Sig Start Date End Date Taking? Authorizing Provider  acetaminophen (TYLENOL) 500 MG tablet Take 1,000 mg by mouth daily as needed for moderate pain.   Yes [provider]  blood  glucose meter kit and supplies KIT Dispense based on patient and insurance preference. Use up to four times daily as directed. (FOR ICD-9 250.00, 250.01). 09/27/18  Yes Charlynne Cousins, MD  glipiZIDE (GLUCOTROL) 5 MG tablet Take 1 tablet (5 mg total) by mouth 2 (two) times daily before a meal. 09/27/18  Yes Charlynne Cousins, MD  hydrOXYzine (VISTARIL) 25 MG capsule Take 25 mg by mouth every 6 (six) hours as needed for anxiety.  12/23/18  Yes [provider]  insulin detemir (LEVEMIR) 100 unit/ml SOLN Inject 0.2 mLs (20 Units total) into the skin daily. Patient taking differently: Inject 20 Units into the skin 2 (two) times daily.  09/27/18  Yes  Charlynne Cousins, MD  LATUDA 60 MG TABS Take 60 mg by mouth at bedtime. 12/23/18  Yes [provider]  Levonorgestrel (LILETTA) 19.5 MCG/DAY IUD IUD 1 each by Intrauterine route once.   Yes [provider]  metFORMIN (GLUCOPHAGE) 1000 MG tablet Take 1 tablet (1,000 mg total) by mouth 2 (two) times daily with a meal. 09/12/18  Yes Recardo Evangelist, PA-C  clindamycin (CLEOCIN) 2 % vaginal cream Place 1 Applicatorful vaginally at bedtime. Patient not taking: Reported on 0/11/5425 0/62/37   Delora Fuel, MD    Family History Family History  Problem Relation Age of Onset  . Hypertension Mother   . Anemia Mother   . Diabetes Father     Social History Social History   Tobacco Use  . Smoking status: Former Smoker    Packs/day: 0.50    Types: Cigarettes  . Smokeless tobacco: Never Used  Substance Use Topics  . Alcohol use: Not Currently  . Drug use: Not Currently     Allergies   Cetirizine & related; Toradol [ketorolac tromethamine]; Flagyl [metronidazole]; Contrast media [iodinated diagnostic agents]; and Nsaids   Review of Systems Review of Systems  Constitutional: Negative for appetite change, chills and fever.  HENT: Negative for ear pain, rhinorrhea, sneezing and sore throat.   Eyes: Negative for photophobia and visual disturbance.  Respiratory: Negative for cough, chest tightness, shortness of breath and wheezing.   Cardiovascular: Negative for chest pain and palpitations.  Gastrointestinal: Positive for abdominal pain and diarrhea. Negative for blood in stool, constipation, nausea and vomiting.  Endocrine: Positive for polydipsia and polyuria.  Genitourinary: Negative for dysuria, hematuria and urgency.  Musculoskeletal: Positive for myalgias.  Skin: Negative for rash.  Neurological: Negative for dizziness, weakness and light-headedness.     Physical Exam Updated Vital Signs BP (!) 144/89 (BP Location: Left Arm)   Pulse 80   Temp 98.4 F (36.9  C) (Oral)   Resp 15   Ht _0  (1.702 m)   Wt 97.5 kg   SpO2 97%   BMI 33.67 kg/m   Physical Exam Vitals signs and nursing note reviewed.  Constitutional:      General: She is not in acute distress.    Appearance: She is well-developed.  HENT:     Head: Normocephalic and atraumatic.     Nose: Nose normal.  Eyes:     General: No scleral icterus.       Left eye: No discharge.     Conjunctiva/sclera: Conjunctivae normal.  Neck:     Musculoskeletal: Normal range of motion and neck supple.  Cardiovascular:     Rate and Rhythm: Normal rate and regular rhythm.     Heart sounds: Normal heart sounds. No murmur. No friction rub. No gallop.   Pulmonary:  Effort: Pulmonary effort is normal. No respiratory distress.     Breath sounds: Normal breath sounds.  Abdominal:     General: Bowel sounds are normal. There is no distension.     Palpations: Abdomen is soft.     Tenderness: There is generalized abdominal tenderness. There is right CVA tenderness and left CVA tenderness. There is no guarding.  Musculoskeletal: Normal range of motion.  Skin:    General: Skin is warm and dry.     Findings: No rash.  Neurological:     Mental Status: She is alert.     Motor: No abnormal muscle tone.     Coordination: Coordination normal.      ED Treatments / Results  Labs (all labs ordered are listed, but only abnormal results are displayed) Labs Reviewed  COMPREHENSIVE METABOLIC PANEL - Abnormal; Notable for the following components:      Result Value   Sodium 125 (*)    Chloride 89 (*)    Glucose, Bld 876 (*)    Calcium 8.4 (*)    All other components within normal limits  URINALYSIS, ROUTINE W REFLEX MICROSCOPIC - Abnormal; Notable for the following components:   Color, Urine STRAW (*)    Glucose, UA >=500 (*)    Ketones, ur 5 (*)    Bacteria, UA RARE (*)    All other components within normal limits  CBG MONITORING, ED - Abnormal; Notable for the following components:    Glucose-Capillary >600 (*)    All other components within normal limits  CBG MONITORING, ED - Abnormal; Notable for the following components:   Glucose-Capillary >600 (*)    All other components within normal limits  LIPASE, BLOOD  CBC  I-STAT BETA HCG BLOOD, ED ([facility], [facility], [facility] ONLY)    EKG None  Radiology Ct Renal Stone Study  Result Date: 12/27/2018 CLINICAL DATA:  Flank pain. EXAM: CT ABDOMEN AND PELVIS WITHOUT CONTRAST TECHNIQUE: Multidetector CT imaging of the abdomen and pelvis was performed following the standard protocol without IV contrast. COMPARISON:  CT 10/28/2018 FINDINGS: Lower chest: Unremarkable. Hepatobiliary: Superior dome of the liver is excluded from the field of view. Small cyst versus focal fatty infiltration adjacent to the falciform ligament. No suspicious hepatic lesion. Gallbladder is decompressed. No calcified gallstone. No biliary dilatation. Pancreas: No ductal dilatation or inflammation. Spleen: Normal in size without focal abnormality. Adrenals/Urinary Tract: Unchanged 13 mm right adrenal adenoma. Normal left adrenal gland. No renal stones or hydronephrosis. No perinephric edema. Both ureters are decompressed without stones along the course. Urinary bladder is physiologically distended. No bladder wall thickening or stone. Stomach/Bowel: Ingested material distends the stomach. No evidence of bowel inflammation, obstruction, or wall thickening allowing for lack of enteric contrast. Moderate stool burden in the colon. No colonic wall thickening or inflammatory change. Colonic diverticulosis on prior exam is not as well appreciated currently. Appendix not visualized, patient with history of appendectomy. Vascular/Lymphatic: Normal course and caliber of noncontrast abdominopelvic vasculature. No adenopathy. Reproductive: IUD in the uterus, unchanged in position from prior exam. Ovaries symmetric in size. Other: No free air, free fluid, or intra-abdominal fluid collection.  Postsurgical change of the anterior abdominal wall. Musculoskeletal: There are no acute or suspicious osseous abnormalities. IMPRESSION: No renal stones or obstructive uropathy. No acute abnormality in the abdomen/pelvis. Electronically Signed   By: Keith Rake M.D.   On: 12/27/2018 21:09    Procedures Procedures (including critical care time)  CRITICAL CARE Performed by: Delia Heady   Total critical  care time: 35 minutes  Critical care time was exclusive of separately billable procedures and treating other patients.  Critical care was necessary to treat or prevent imminent or life-threatening deterioration.  Critical care was time spent personally by me on the following activities: development of treatment plan with patient and/or surrogate as well as nursing, discussions with consultants, evaluation of patient's response to treatment, examination of patient, obtaining history from patient or surrogate, ordering and performing treatments and interventions, ordering and review of laboratory studies, ordering and review of radiographic studies, pulse oximetry and re-evaluation of patient's condition.   Medications Ordered in ED Medications  insulin aspart (novoLOG) injection 15 Units (has no administration in time range)  sodium chloride 0.9 % bolus 1,000 mL (has no administration in time range)  sodium chloride 0.9 % bolus 2,000 mL (2,000 mLs Intravenous New Bag/Given 12/27/18 2048)  fentaNYL (SUBLIMAZE) injection 50 mcg (50 mcg Intravenous Given 12/27/18 2049)  insulin aspart (novoLOG) injection 10 Units (10 Units Subcutaneous Given 12/27/18 2048)  insulin aspart (novoLOG) injection 10 Units (10 Units Subcutaneous Given 12/27/18 2201)  dicyclomine (BENTYL) injection 20 mg (20 mg Intramuscular Given 12/27/18 2149)  famotidine (PEPCID) IVPB 20 mg premix (20 mg Intravenous New Bag/Given 12/27/18 2150)  diphenhydrAMINE (BENADRYL) injection 12.5 mg (12.5 mg Intravenous Given 12/27/18 2213)      Initial Impression / Assessment and Plan / ED Course  I have reviewed the triage vital signs and the nursing notes.  Pertinent labs & imaging results that were available during my care of the patient were reviewed by me and considered in my medical decision making (see chart for details).        33 year old female with a past medical history of IDDM, presents to ED for hyperglycemia, polyuria, polydipsia, diarrhea and generalized abdominal pain since yesterday.  States that her sugar has been reading high on her glucometer at home.  She is concerned that her insulin regimen is not working for her.  She denies any shortness of breath, fever or URI symptoms.  On my exam abdomen is soft, nontender nondistended.  Vital signs are within normal limits.  Initial lab work shows glucose of 876, sodium of 125, normal anion gap. 5 ketones in urine.  Lipase unremarkable.  hCG is negative.  CT renal stone study is negative.  Patient will be given insulin, fluids, symptomatic medication to help with her symptoms and bring down her blood sugar. Care handed off to oncoming provider. I anticipate discharge home if CBG improves.   Portions of this note were generated with [employer]. Dictation errors may occur despite best attempts at proofreading.   Final Clinical Impressions(s) / ED Diagnoses   Final diagnoses:  Hyperglycemia    ED Discharge Orders    None       Delia Heady, PA-C 12/27/18 2347    Deno Etienne, DO 12/28/18 0008

## 2018-12-27 NOTE — ED Triage Notes (Signed)
Pt c/o abd pains, muscle spasms, urinary frequency and diarrhea since yesterday. Reports that her blood sugar has been reading "high".

## 2018-12-27 NOTE — ED Notes (Addendum)
Date and time results received: 12/27/18 1930  Test: Glucose Critical Value: 876  Name of Provider Notified: Dr. Jackelyn Poling RN/Triage  Orders Received? Or Actions Taken?: see orders

## 2018-12-27 NOTE — Discharge Instructions (Addendum)
Please speak to your primary care provider regarding your insulin regimen.  Try to limit sugars/carbs.

## 2018-12-28 LAB — CBG MONITORING, ED
Glucose-Capillary: 240 mg/dL — ABNORMAL HIGH (ref 70–99)
Glucose-Capillary: 434 mg/dL — ABNORMAL HIGH (ref 70–99)

## 2018-12-28 NOTE — ED Provider Notes (Signed)
Assumed care in sign out from [place].  See prior notes for full H&P.  Briefly, 33 y.o. F here with hyperglycemia.  Frequent ED visits for same.  Work-up here is overall reassuring aside from glucose of 800.  Her anion gap remains normal at 14, normal bicarb.  Small amount of ketones in the urine but this is typical for her.  Plan: CBG somewhat improved after initial IV fluids and insulin.  Will need some continued therapy.  Second fluid bolus and dosing of insulin ordered.  Anticipate discharge.  2:26 AM CBG much better controlled, now 240.  She will need close follow-up with PCP regarding her insulin regimen.  Can return here for any new/acute changes.   Larene Pickett, PA-C 12/28/18 0255    Ripley Fraise, MD 12/28/18 [phone]

## 2018-12-28 NOTE — ED Notes (Signed)
PT DISCHARGED. INSTRUCTIONS GIVEN. AAOX4. PT IN NO APPARENT DISTRESS WITH MILD PAIN. THE OPPORTUNITY TO ASK QUESTIONS WAS PROVIDED. 

## 2019-01-08 ENCOUNTER — Emergency Department (HOSPITAL_COMMUNITY)
Admission: EM | Admit: 2019-01-08 | Discharge: 2019-01-08 | Disposition: A | Discharge status: Home / Self Care | Payer: Medicaid Other | Attending: Emergency Medicine | Admitting: Emergency Medicine

## 2019-01-08 ENCOUNTER — Other Ambulatory Visit: Payer: Self-pay

## 2019-01-08 ENCOUNTER — Emergency Department (HOSPITAL_COMMUNITY): Payer: Medicaid Other

## 2019-01-08 DIAGNOSIS — E860 Dehydration: Secondary | ICD-10-CM | POA: Insufficient documentation

## 2019-01-08 DIAGNOSIS — R319 Hematuria, unspecified: Secondary | ICD-10-CM

## 2019-01-08 DIAGNOSIS — Z87891 Personal history of nicotine dependence: Secondary | ICD-10-CM | POA: Diagnosis not present

## 2019-01-08 DIAGNOSIS — E119 Type 2 diabetes mellitus without complications: Secondary | ICD-10-CM | POA: Insufficient documentation

## 2019-01-08 DIAGNOSIS — R739 Hyperglycemia, unspecified: Secondary | ICD-10-CM | POA: Diagnosis not present

## 2019-01-08 DIAGNOSIS — M791 Myalgia, unspecified site: Secondary | ICD-10-CM | POA: Diagnosis present

## 2019-01-08 DIAGNOSIS — R1031 Right lower quadrant pain: Secondary | ICD-10-CM | POA: Diagnosis not present

## 2019-01-08 DIAGNOSIS — R52 Pain, unspecified: Secondary | ICD-10-CM

## 2019-01-08 LAB — URINALYSIS, ROUTINE W REFLEX MICROSCOPIC
BILIRUBIN URINE: NEGATIVE
Ketones, ur: 5 mg/dL — AB
Leukocytes,Ua: NEGATIVE
Nitrite: NEGATIVE
Protein, ur: NEGATIVE mg/dL
Specific Gravity, Urine: 1.028 (ref 1.005–1.030)
pH: 6 (ref 5.0–8.0)

## 2019-01-08 LAB — COMPREHENSIVE METABOLIC PANEL
ALT: 16 U/L (ref 0–44)
AST: 15 U/L (ref 15–41)
Albumin: 3.2 g/dL — ABNORMAL LOW (ref 3.5–5.0)
Alkaline Phosphatase: 77 U/L (ref 38–126)
Anion gap: 11 (ref 5–15)
BUN: 9 mg/dL (ref 6–20)
CO2: 21 mmol/L — ABNORMAL LOW (ref 22–32)
Calcium: 8.5 mg/dL — ABNORMAL LOW (ref 8.9–10.3)
Chloride: 94 mmol/L — ABNORMAL LOW (ref 98–111)
Creatinine, Ser: 1.01 mg/dL — ABNORMAL HIGH (ref 0.44–1.00)
GFR calc Af Amer: >60 mL/min (ref >60)
GFR calc non Af Amer: >60 mL/min (ref >60)
Glucose, Bld: 786 mg/dL (ref 70–99)
Potassium: 3.7 mmol/L (ref 3.5–5.1)
Sodium: 126 mmol/L — ABNORMAL LOW (ref 135–145)
Total Bilirubin: 0.7 mg/dL (ref 0.3–1.2)
Total Protein: 6.8 g/dL (ref 6.5–8.1)

## 2019-01-08 LAB — CBC
HCT: 39.5 % (ref 36.0–46.0)
Hemoglobin: 12.3 g/dL (ref 12.0–15.0)
MCH: 26.7 pg (ref 26.0–34.0)
MCHC: 31.1 g/dL (ref 30.0–36.0)
MCV: 85.7 fL (ref 80.0–100.0)
NRBC: 0 % (ref 0.0–0.2)
Platelets: 270 10*3/uL (ref 150–400)
RBC: 4.61 MIL/uL (ref 3.87–5.11)
RDW: 14.1 % (ref 11.5–15.5)
WBC: 5 10*3/uL (ref 4.0–10.5)

## 2019-01-08 LAB — I-STAT BETA HCG BLOOD, ED (MC, WL, AP ONLY): I-stat hCG, quantitative: <5 m[IU]/mL (ref <5)

## 2019-01-08 LAB — CBG MONITORING, ED
Glucose-Capillary: >600 mg/dL (ref 70–99)
Glucose-Capillary: >600 mg/dL (ref 70–99)
Glucose-Capillary: >600 mg/dL (ref 70–99)
Glucose-Capillary: 417 mg/dL — ABNORMAL HIGH (ref 70–99)
Glucose-Capillary: 335 mg/dL — ABNORMAL HIGH (ref 70–99)

## 2019-01-08 LAB — CK: Total CK: 49 U/L (ref 38–234)

## 2019-01-08 MED ORDER — SODIUM CHLORIDE 0.9 % IV BOLUS
1000.0000 mL | Freq: Once | INTRAVENOUS | Status: AC
  Administered 2019-01-08: 1000 mL via INTRAVENOUS

## 2019-01-08 MED ORDER — HYDROMORPHONE HCL 1 MG/ML IJ SOLN
0.5000 mg | Freq: Once | INTRAMUSCULAR | Status: AC
  Administered 2019-01-08: 0.5 mg via INTRAVENOUS
  Filled 2019-01-08: qty 1

## 2019-01-08 MED ORDER — INSULIN DETEMIR 100 UNIT/ML FLEXPEN: 20.0000 [IU] | Freq: Two times a day (BID) | SUBCUTANEOUS | 0 refills | Status: DC

## 2019-01-08 MED ORDER — POTASSIUM CHLORIDE CRYS ER 20 MEQ PO TBCR
40.0000 meq | EXTENDED_RELEASE_TABLET | Freq: Once | ORAL | Status: AC
  Administered 2019-01-08: 40 meq via ORAL
  Filled 2019-01-08: qty 2

## 2019-01-08 MED ORDER — INSULIN REGULAR(HUMAN) IN NACL 100-0.9 UT/100ML-% IV SOLN
INTRAVENOUS | Status: DC
  Administered 2019-01-08: 5.4 [IU]/h via INTRAVENOUS
  Filled 2019-01-08: qty 100

## 2019-01-08 MED ORDER — OXYCODONE HCL 5 MG PO TABS
5.0000 mg | ORAL_TABLET | Freq: Once | ORAL | Status: AC
  Administered 2019-01-08: 5 mg via ORAL
  Filled 2019-01-08: qty 1

## 2019-01-08 NOTE — ED Provider Notes (Signed)
I received this patient in signout from Dr. Sedonia Small.  She had presented with hyperglycemia without DKA.  At time of signout, her blood glucose had improved to 417 and insulin drip was stopped.  Patient was observed for a period of time and an hour and a half later, blood glucose was continuing to improve at 335.  Patient well-appearing and tolerating p.o. on reassessment.  She states that she has been running out of her Levemir pens because of the way the prescription was written and she needs to see her PCP for this.  Provided with short refill until she can get into clinic.  Emphasized good hydration and medication compliance.  Patient discharged in satisfactory condition.   Albirda Shiel, Wenda Overland, MD 01/08/19 [phone]

## 2019-01-08 NOTE — ED Triage Notes (Signed)
Pt here for recurrent muscle spasms in all four extremities onset yesterday. Pt reports right sided flank pain and urinary frequency.

## 2019-01-08 NOTE — Discharge Instructions (Addendum)
You were evaluated in the Emergency Department and after careful evaluation, we did not find any emergent condition requiring admission or further testing in the hospital.  Your symptoms today seem to be due to dehydration related to your high blood sugar.  Very important that you follow up closely with your primary doctor to discuss your insulin dosing.  You also have blood in your urine that is unexplained.  Please call the urology specialists for follow up.  Please return to the Emergency Department if you experience any worsening of your condition.  We encourage you to follow up with a primary care provider.  Thank you for allowing [place] to be a part of your care.

## 2019-01-08 NOTE — ED Provider Notes (Signed)
[facility] Emergency Department Provider Note MRN:  [id number]  Arrival date & time: 01/08/19     Chief Complaint   Body aches  History of Present Illness   Jodi Mcgee is a 33 y.o. year-old female with a history of diabetes presenting to the ED with chief complaint of body aches.  Diffuse body aches beginning gradually yesterday, worsening throughout the day.  Gradual onset right flank pain, continued dysuria for the past several days.  Denies fever, no cough, no nasal congestion, no sore throat.  Subjective fevers today.  Denies chest pain or shortness of breath, mild epigastric discomfort.  Denies vaginal bleeding or discharge, no lower abdominal pain.  No numbness or weakness to the arms or legs.  Symptoms are moderate, constant, no exacerbating or alleviating factors.  Taking her insulin like normal, does not know why her sugar continues to be elevated.  Review of Systems  A complete 10 system review of systems was obtained and all systems are negative except as noted in the HPI and PMH.   Patient's Health History    Past Medical History:  Diagnosis Date  . Anemia   . Anxiety   . Blood transfusion without reported diagnosis    both CS  . Diabetes mellitus without complication ([place])   . GERD (gastroesophageal reflux disease)    has resolved  . IBS (irritable bowel syndrome)   . Ovarian cyst   . Peritonitis, acute generalized ([place])   . Pulmonary embolism ([place])   . Sepsis [facility])     Past Surgical History:  Procedure Laterality Date  . APPENDECTOMY     ruptured   . BUNIONECTOMY    . CESAREAN SECTION    . CESAREAN SECTION Bilateral 11/20/2017   Procedure: CESAREAN SECTION;  Surgeon: Sloan Leiter, MD;  Location: [place];  Service: Obstetrics;  Laterality: Bilateral;  . OVARIAN CYST DRAINAGE    . SMALL BOWEL REPAIR      Family History  Problem Relation Age of Onset  . Hypertension Mother   . Anemia Mother   . Diabetes Father      Social History   Socioeconomic History  . Marital status: Single    Spouse name: Not on file  . Number of children: Not on file  . Years of education: Not on file  . Highest education level: Not on file  Occupational History  . Not on file  Social Needs  . Financial resource strain: Not on file  . Food insecurity:    Worry: Not on file    Inability: Not on file  . Transportation needs:    Medical: Not on file    Non-medical: Not on file  Tobacco Use  . Smoking status: Former Smoker    Packs/day: 0.50    Types: Cigarettes  . Smokeless tobacco: Never Used  Substance and Sexual Activity  . Alcohol use: Not Currently  . Drug use: Not Currently  . Sexual activity: Yes    Birth control/protection: I.U.D.  Lifestyle  . Physical activity:    Days per week: Not on file    Minutes per session: Not on file  . Stress: Not on file  Relationships  . Social connections:    Talks on phone: Not on file    Gets together: Not on file    Attends religious service: Not on file    Active member of club or organization: Not on file    Attends meetings of clubs or organizations:  Not on file    Relationship status: Not on file  . Intimate partner violence:    Fear of current or ex partner: Not on file    Emotionally abused: Not on file    Physically abused: Not on file    Forced sexual activity: Not on file  Other Topics Concern  . Not on file  Social History Narrative   ** Merged History Encounter **         Physical Exam  Vital Signs and Nursing Notes reviewed Vitals:   01/08/19 1500 01/08/19 1515  BP: 129/81 118/76  Pulse: 90 96  Resp:    Temp:    SpO2: 99% 94%    CONSTITUTIONAL: Well-appearing, NAD NEURO:  Alert and oriented x 3, no focal deficits EYES:  eyes equal and reactive ENT/NECK:  no LAD, no JVD CARDIO: Regular rate, well-perfused, normal S1 and S2 PULM:  CTAB no wheezing or rhonchi GI/GU:  normal bowel sounds, non-distended, non-tender; mild right CVA  tenderness MSK/SPINE:  No gross deformities, no edema SKIN:  no rash, atraumatic PSYCH:  Appropriate speech and behavior  Diagnostic and Interventional Summary    Labs Reviewed  URINALYSIS, ROUTINE W REFLEX MICROSCOPIC - Abnormal; Notable for the following components:      Result Value   Color, Urine STRAW (*)    Glucose, UA >=500 (*)    Hgb urine dipstick LARGE (*)    Ketones, ur 5 (*)    Bacteria, UA RARE (*)    All other components within normal limits  COMPREHENSIVE METABOLIC PANEL - Abnormal; Notable for the following components:   Sodium 126 (*)    Chloride 94 (*)    CO2 21 (*)    Glucose, Bld 786 (*)    Creatinine, Ser 1.01 (*)    Calcium 8.5 (*)    Albumin 3.2 (*)    All other components within normal limits  CBG MONITORING, ED - Abnormal; Notable for the following components:   Glucose-Capillary >600 (*)    All other components within normal limits  CBG MONITORING, ED - Abnormal; Notable for the following components:   Glucose-Capillary >600 (*)    All other components within normal limits  CBG MONITORING, ED - Abnormal; Notable for the following components:   Glucose-Capillary >600 (*)    All other components within normal limits  URINE CULTURE  URINE CULTURE  CBC  CK  I-STAT BETA HCG BLOOD, ED ([facility], [facility], [facility] ONLY)    CT RENAL STONE STUDY  Final Result      Medications  insulin regular, human (MYXREDLIN) 100 units/ 100 mL infusion (10.8 Units/hr Intravenous Rate/Dose Change 01/08/19 1449)  sodium chloride 0.9 % bolus 1,000 mL (0 mLs Intravenous Stopped 01/08/19 1531)  sodium chloride 0.9 % bolus 1,000 mL (0 mLs Intravenous Stopped 01/08/19 1531)  oxyCODONE (Oxy IR/ROXICODONE) immediate release tablet 5 mg (5 mg Oral Given 01/08/19 1332)  HYDROmorphone (DILAUDID) injection 0.5 mg (0.5 mg Intravenous Given 01/08/19 1506)  potassium chloride SA (K-DUR,KLOR-CON) CR tablet 40 mEq (40 mEq Oral Given 01/08/19 1531)     Procedures Critical Care  ED Course and Medical  Decision Making  I have reviewed the triage vital signs and the nursing notes.  Pertinent labs & imaging results that were available during my care of the patient were reviewed by me and considered in my medical decision making (see below for details).  Suspect underlying UTI, possibly pyelonephritis as the cause of patient's hyperglycemia.  Considering metabolic disarray giving diffuse  cramps, also considering rhabdomyolysis.  Labs pending, glucose is greater than 600 will need insulin therapy, fluids, DKA also a possibility.  No evidence of DKA, urinalysis without evidence to suggest infection.  There is large blood, raising the possibility or concern for rhabdo or kidney stone.  CT without evidence of stone, unremarkable.  CK is negative.  In general it appears the patient is having cramping pain related to dehydration in the setting of significant hyperglycemia.  Patient is currently receiving fluids, insulin, potassium, when feeling better and when blood sugar is improved patient would be a candidate for discharge.  Signed out to Dr. Rex Kras at shift change.    Barth Kirks. Sedonia Small, [place] [email or web address]  Final Clinical Impressions(s) / ED Diagnoses     ICD-10-CM   1. Hyperglycemia R73.9   2. Hematuria, unspecified type R31.9   3. Body aches R52   4. Dehydration E86.0     ED Discharge Orders    None         Maudie Flakes, MD 01/08/19 1615

## 2019-01-09 LAB — URINE CULTURE

## 2019-01-19 ENCOUNTER — Encounter (HOSPITAL_COMMUNITY): Payer: Self-pay

## 2019-01-19 ENCOUNTER — Other Ambulatory Visit: Payer: Self-pay

## 2019-01-19 ENCOUNTER — Emergency Department (HOSPITAL_COMMUNITY)
Admission: EM | Admit: 2019-01-19 | Discharge: 2019-01-19 | Disposition: A | Discharge status: Home / Self Care | Payer: Medicaid Other | Attending: Emergency Medicine | Admitting: Emergency Medicine

## 2019-01-19 DIAGNOSIS — I10 Essential (primary) hypertension: Secondary | ICD-10-CM | POA: Insufficient documentation

## 2019-01-19 DIAGNOSIS — Z79899 Other long term (current) drug therapy: Secondary | ICD-10-CM | POA: Diagnosis not present

## 2019-01-19 DIAGNOSIS — R197 Diarrhea, unspecified: Secondary | ICD-10-CM

## 2019-01-19 DIAGNOSIS — R1030 Lower abdominal pain, unspecified: Secondary | ICD-10-CM

## 2019-01-19 DIAGNOSIS — E1165 Type 2 diabetes mellitus with hyperglycemia: Secondary | ICD-10-CM | POA: Diagnosis not present

## 2019-01-19 DIAGNOSIS — Z87891 Personal history of nicotine dependence: Secondary | ICD-10-CM | POA: Diagnosis not present

## 2019-01-19 DIAGNOSIS — R21 Rash and other nonspecific skin eruption: Secondary | ICD-10-CM | POA: Diagnosis present

## 2019-01-19 DIAGNOSIS — E876 Hypokalemia: Secondary | ICD-10-CM | POA: Diagnosis not present

## 2019-01-19 DIAGNOSIS — Z794 Long term (current) use of insulin: Secondary | ICD-10-CM | POA: Diagnosis not present

## 2019-01-19 DIAGNOSIS — Z8719 Personal history of other diseases of the digestive system: Secondary | ICD-10-CM

## 2019-01-19 DIAGNOSIS — Z86718 Personal history of other venous thrombosis and embolism: Secondary | ICD-10-CM | POA: Diagnosis not present

## 2019-01-19 DIAGNOSIS — L304 Erythema intertrigo: Secondary | ICD-10-CM | POA: Insufficient documentation

## 2019-01-19 DIAGNOSIS — K580 Irritable bowel syndrome with diarrhea: Secondary | ICD-10-CM | POA: Insufficient documentation

## 2019-01-19 LAB — URINALYSIS, ROUTINE W REFLEX MICROSCOPIC
Bacteria, UA: NONE SEEN
Bilirubin Urine: NEGATIVE
Hgb urine dipstick: NEGATIVE
Ketones, ur: 5 mg/dL — AB
Leukocytes,Ua: NEGATIVE
Nitrite: NEGATIVE
Protein, ur: NEGATIVE mg/dL
Specific Gravity, Urine: 1.029 (ref 1.005–1.030)
pH: 6 (ref 5.0–8.0)

## 2019-01-19 LAB — CBC WITH DIFFERENTIAL/PLATELET
Abs Immature Granulocytes: 0.01 10*3/uL (ref 0.00–0.07)
Basophils Absolute: 0 10*3/uL (ref 0.0–0.1)
Basophils Relative: 1 %
EOS PCT: 1 %
Eosinophils Absolute: 0 10*3/uL (ref 0.0–0.5)
HCT: 38.6 % (ref 36.0–46.0)
Hemoglobin: 12.3 g/dL (ref 12.0–15.0)
Immature Granulocytes: 0 %
LYMPHS PCT: 20 %
Lymphs Abs: 0.7 10*3/uL (ref 0.7–4.0)
MCH: 26.3 pg (ref 26.0–34.0)
MCHC: 31.9 g/dL (ref 30.0–36.0)
MCV: 82.5 fL (ref 80.0–100.0)
MONO ABS: 0.3 10*3/uL (ref 0.1–1.0)
Monocytes Relative: 9 %
Neutro Abs: 2.5 10*3/uL (ref 1.7–7.7)
Neutrophils Relative %: 69 %
Platelets: 290 10*3/uL (ref 150–400)
RBC: 4.68 MIL/uL (ref 3.87–5.11)
RDW: 13.2 % (ref 11.5–15.5)
WBC: 3.6 10*3/uL — AB (ref 4.0–10.5)
nRBC: 0 % (ref 0.0–0.2)

## 2019-01-19 LAB — LIPASE, BLOOD: Lipase: 30 U/L (ref 11–51)

## 2019-01-19 LAB — POCT I-STAT EG7
Acid-Base Excess: 4 mmol/L — ABNORMAL HIGH (ref 0.0–2.0)
Bicarbonate: 28.1 mmol/L — ABNORMAL HIGH (ref 20.0–28.0)
CALCIUM ION: 1.12 mmol/L — AB (ref 1.15–1.40)
HCT: 39 % (ref 36.0–46.0)
Hemoglobin: 13.3 g/dL (ref 12.0–15.0)
O2 Saturation: 100 %
Potassium: 3.1 mmol/L — ABNORMAL LOW (ref 3.5–5.1)
Sodium: 130 mmol/L — ABNORMAL LOW (ref 135–145)
TCO2: 29 mmol/L (ref 22–32)
pCO2, Ven: 40.9 mmHg — ABNORMAL LOW (ref 44.0–60.0)
pH, Ven: 7.445 — ABNORMAL HIGH (ref 7.250–7.430)
pO2, Ven: 182 mmHg — ABNORMAL HIGH (ref 32.0–45.0)

## 2019-01-19 LAB — COMPREHENSIVE METABOLIC PANEL
ALK PHOS: 84 U/L (ref 38–126)
ALT: 14 U/L (ref 0–44)
AST: 12 U/L — ABNORMAL LOW (ref 15–41)
Albumin: 3.2 g/dL — ABNORMAL LOW (ref 3.5–5.0)
Anion gap: 11 (ref 5–15)
BUN: 10 mg/dL (ref 6–20)
CALCIUM: 9 mg/dL (ref 8.9–10.3)
CO2: 24 mmol/L (ref 22–32)
Chloride: 93 mmol/L — ABNORMAL LOW (ref 98–111)
Creatinine, Ser: 0.97 mg/dL (ref 0.44–1.00)
GFR calc non Af Amer: >60 mL/min (ref >60)
Glucose, Bld: 659 mg/dL (ref 70–99)
Potassium: 3.1 mmol/L — ABNORMAL LOW (ref 3.5–5.1)
Sodium: 128 mmol/L — ABNORMAL LOW (ref 135–145)
Total Bilirubin: 0.7 mg/dL (ref 0.3–1.2)
Total Protein: 6.7 g/dL (ref 6.5–8.1)

## 2019-01-19 LAB — CBG MONITORING, ED
Glucose-Capillary: >600 mg/dL (ref 70–99)
Glucose-Capillary: 514 mg/dL (ref 70–99)
Glucose-Capillary: 234 mg/dL — ABNORMAL HIGH (ref 70–99)

## 2019-01-19 LAB — I-STAT BETA HCG BLOOD, ED (MC, WL, AP ONLY): I-stat hCG, quantitative: <5 m[IU]/mL (ref <5)

## 2019-01-19 MED ORDER — SODIUM CHLORIDE 0.9 % IV BOLUS
2000.0000 mL | Freq: Once | INTRAVENOUS | Status: AC
  Administered 2019-01-19: 2000 mL via INTRAVENOUS

## 2019-01-19 MED ORDER — ACETAMINOPHEN 500 MG PO TABS
1000.0000 mg | ORAL_TABLET | Freq: Once | ORAL | Status: AC
  Administered 2019-01-19: 1000 mg via ORAL
  Filled 2019-01-19: qty 2

## 2019-01-19 MED ORDER — INSULIN ASPART 100 UNIT/ML ~~LOC~~ SOLN
10.0000 [IU] | Freq: Once | SUBCUTANEOUS | Status: AC
  Administered 2019-01-19: 10 [IU] via SUBCUTANEOUS

## 2019-01-19 MED ORDER — MORPHINE SULFATE (PF) 4 MG/ML IV SOLN
4.0000 mg | Freq: Once | INTRAVENOUS | Status: AC
  Administered 2019-01-19: 4 mg via INTRAVENOUS
  Filled 2019-01-19: qty 1

## 2019-01-19 MED ORDER — INSULIN ASPART 100 UNIT/ML ~~LOC~~ SOLN
15.0000 [IU] | Freq: Once | SUBCUTANEOUS | Status: AC
  Administered 2019-01-19: 15 [IU] via SUBCUTANEOUS

## 2019-01-19 MED ORDER — DICYCLOMINE HCL 20 MG PO TABS: 20.0000 mg | ORAL_TABLET | Freq: Three times a day (TID) | ORAL | 0 refills | Status: DC | PRN

## 2019-01-19 MED ORDER — POTASSIUM CHLORIDE CRYS ER 20 MEQ PO TBCR
60.0000 meq | EXTENDED_RELEASE_TABLET | Freq: Once | ORAL | Status: AC
  Administered 2019-01-19: 60 meq via ORAL
  Filled 2019-01-19: qty 3

## 2019-01-19 MED ORDER — NYSTATIN 100000 UNIT/GM EX POWD: CUTANEOUS | 0 refills | Status: DC

## 2019-01-19 NOTE — ED Triage Notes (Signed)
Pt c/o right sided flank pain 10/10. Also c/o rash to buttocks. PT having episodes of diarrhea and constipation.

## 2019-01-19 NOTE — Discharge Instructions (Addendum)
Your abdominal pain could be from the fact that your sugar is elevated. It's very important that you take all of your prescribed medications exactly as directed.  The irritation you have between your buttocks could be from moisture buildup, try to keep the area clean and dry, use nystatin powder as directed.  You may use diaper rash ointment as well.  Alternate between Tylenol and ibuprofen as needed for pain.  Use bentyl to help with abdominal pain and diarrhea. Eat a BRAT diet as outlined below to help with diarrhea. Stay well-hydrated and get plenty of rest. Follow up with your regular doctor in 1 week for recheck and to discuss any adjustments to your diabetes medications. Return to the ER for emergent changes or worsening symptoms.

## 2019-01-19 NOTE — ED Notes (Signed)
Pt provided urine cup and asked for urine.

## 2019-01-19 NOTE — ED Provider Notes (Signed)
[facility] EMERGENCY DEPARTMENT Provider Note   CSN: [id number] Arrival date & time: 01/19/19  1125    History   Chief Complaint Chief Complaint  Patient presents with   Flank Pain   Rash    HPI    Jodi Mcgee is a 33 y.o. female with a PMHx of DM2, anemia, anxiety, IBS, GERD, prior PE, and other conditions listed below, and PSHx of appendectomy, who presents to the ED with multiple complaints.  Her main complaint is of a "rash" between her butt cheeks which is painful but not itchy and has been present for about a week.  She has had similar rashes in the genital area before but never on her butt cheeks.  She states that it is painful to sit.  She has been using Aquaphor cream with little relief.  She reports having IBS and often has diarrhea/soft stools which is chronic, but occasionally the stools will get harder and then soft again.  She has about 4-5 episodes daily of nonbloody soft stools.  Her last bowel movement was this morning and was soft.  She wonders whether the rash could be from the fact that she is going to the bathroom a lot.  Again, this is a fairly chronic issue for her.  She also complains of 2 to 3 days of right lower abdominal pain which she describes as 10/10 intermittent sharp nonradiating RLQ pain that worsens with having a bowel movement and has been unrelieved with Tylenol.  Chart review reveals that she was seen on 01/08/19 for body aches and R flank pain, her U/A did not reveal any evidence of infection but had some blood in it, so CT renal study was done but did not show any stones or acute findings; labs were notable for glucose 786, she was started on an insulin drip and her sugars came down nicely at which time she was discharged with rx for levemir to bridge her until she could get to her regular doctor.  She states that she has been mostly compliant with her diabetes regimen.  She takes Levemir 20 units twice a day which he took at 8 AM  this morning, she did not check her blood sugar before or after taking it.  She also takes glyburide 5 mg a day which she took at 8 AM, and she usually does take this one.  Lastly she is on metformin 1029m which is supposed to be taken twice daily but she sometimes does not take it compliantly, her last dose was yesterday.  She states that her sugar is "always high".  Of note, she's had a few more visits for abd pain/flank pain and hyperglycemia, once on 12/27/18 at which time she had a CT renal study which was also negative, and once on 10/28/18 at which time she had a CT renal which showed sigmoid diverticulosis but no other acute findings, and then had a pelvic U/S which was negative.   She denies any fevers, chills, chest pain, shortness of breath, nausea, vomiting, ongoing constipation, obstipation, melena, hematochezia, dysuria, hematuria, vaginal bleeding or discharge, myalgias, throughout his, numbness, tingling, focal weakness, or any other complaints at this time.  She denies recent travel, sick contacts, suspicious food intake, recent alcohol use, or NSAID use. [facility]   The history is provided by the patient and medical records. No language interpreter was used.  Flank Pain  Associated symptoms include abdominal pain. Pertinent negatives include no chest  pain and no shortness of breath.  Rash  Associated symptoms: abdominal pain and diarrhea (chronic)   Associated symptoms: no fever, no joint pain, no myalgias, no nausea, no shortness of breath and not vomiting     Past Medical History:  Diagnosis Date   Anemia    Anxiety    Blood transfusion without reported diagnosis    both CS   Diabetes mellitus without complication (HCC)    GERD (gastroesophageal reflux disease)    has resolved   IBS (irritable bowel syndrome)    Ovarian cyst    Peritonitis, acute generalized ([place])    Pulmonary embolism ([place])    Sepsis ([place])     Patient Active Problem List    Diagnosis Date Noted   Uncontrolled type 2 DM with hyperosmolar nonketotic hyperglycemia ([place]) 09/26/2018   Hyponatremia 09/26/2018   Uncontrolled type 2 diabetes mellitus with hyperosmolar nonketotic hyperglycemia ([place]) 09/26/2018   History of cesarean section, classical 01/03/2018   Chronic hypertension 01/03/2018   IUD contraception 01/03/2018   Postpartum care and examination 01/03/2018   Pulmonary embolism during puerperium 12/04/2017   Pulmonary edema 11/30/2017   S/P cesarean section 11/21/2017   Pelvic adhesive disease 10/22/2017   Nasal septal perforation 10/17/2017   History of cocaine abuse ([place]) 10/17/2017   Epistaxis 10/17/2017   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   Obesity (BMI 30-39.9) 09/25/2017   Hypertension in pregnancy, preeclampsia, severe, delivered/postpartum 09/13/2017   Previous cesarean section complicating pregnancy 76/28/3151   Type 2 diabetes mellitus, without long-term current use of insulin ([place]) 08/23/2017   Chronic pain syndrome 08/23/2017    Past Surgical History:  Procedure Laterality Date   APPENDECTOMY     ruptured    BUNIONECTOMY     CESAREAN SECTION     CESAREAN SECTION Bilateral 11/20/2017   Procedure: CESAREAN SECTION;  Surgeon: Sloan Leiter, MD;  Location: [facility];  Service: Obstetrics;  Laterality: Bilateral;   OVARIAN CYST DRAINAGE     SMALL BOWEL REPAIR       OB History    Gravida  3   Para  3   Term  1   Preterm  2   AB  0   Living  3     SAB  0   TAB  0   Ectopic  0   Multiple  0   Live Births  3            Home Medications    Prior to Admission medications   Medication Sig Start Date End Date Taking? Authorizing Provider  acetaminophen (TYLENOL) 500 MG tablet Take 1,000 mg by mouth daily as needed for moderate pain.    [provider]  blood glucose meter kit and supplies KIT Dispense based on patient and insurance preference. Use up to four  times daily as directed. (FOR ICD-9 250.00, 250.01). 09/27/18   Charlynne Cousins, MD  clindamycin (CLEOCIN) 2 % vaginal cream Place 1 Applicatorful vaginally at bedtime. Patient not taking: Reported on 04/27/1606 3/71/06   Delora Fuel, MD  glipiZIDE (GLUCOTROL) 5 MG tablet Take 1 tablet (5 mg total) by mouth 2 (two) times daily before a meal. 09/27/18   Charlynne Cousins, MD  hydrOXYzine (VISTARIL) 25 MG capsule Take 25 mg by mouth every 6 (six) hours as needed for anxiety.  12/23/18   [provider]  insulin detemir (LEVEMIR) 100 unit/ml SOLN Inject 0.2 mLs (20 Units total) into the skin 2 (two) times daily.  01/08/19   Little, Wenda Overland, MD  LATUDA 60 MG TABS Take 60 mg by mouth at bedtime. 12/23/18   [provider]  Levonorgestrel (LILETTA) 19.5 MCG/DAY IUD IUD 1 each by Intrauterine route once. 2019    [provider]  metFORMIN (GLUCOPHAGE) 1000 MG tablet Take 1 tablet (1,000 mg total) by mouth 2 (two) times daily with a meal. 09/12/18   Recardo Evangelist, PA-C    Family History Family History  Problem Relation Age of Onset   Hypertension Mother    Anemia Mother    Diabetes Father     Social History Social History   Tobacco Use   Smoking status: Former Smoker    Packs/day: 0.50    Types: Cigarettes   Smokeless tobacco: Never Used  Substance Use Topics   Alcohol use: Not Currently   Drug use: Not Currently     Allergies   Cetirizine & related; Toradol [ketorolac tromethamine]; Flagyl [metronidazole]; Contrast media [iodinated diagnostic agents]; and Nsaids   Review of Systems Review of Systems  Constitutional: Negative for chills and fever.  Respiratory: Negative for shortness of breath.   Cardiovascular: Negative for chest pain.  Gastrointestinal: Positive for abdominal pain and diarrhea (chronic). Negative for blood in stool, constipation (occasionally alternating, but still having BMs), nausea and vomiting.  Genitourinary:  Positive for flank pain. Negative for dysuria, hematuria, vaginal bleeding and vaginal discharge.  Musculoskeletal: Negative for arthralgias and myalgias.  Skin: Positive for rash. Negative for color change.  Allergic/Immunologic: Positive for immunocompromised state (DM2).  Neurological: Negative for weakness and numbness.  Psychiatric/Behavioral: Negative for confusion.   All other systems reviewed and are negative for acute change except as noted in the HPI.    Physical Exam Updated Vital Signs BP 111/82    Pulse 98    Temp 98.7 F (37.1 C) (Oral)    Ht '5\' 7"'  (1.702 m)    Wt 97.5 kg    LMP 01/19/2019    SpO2 97%    BMI 33.67 kg/m    Physical Exam Vitals signs and nursing note reviewed. Exam conducted with a chaperone present.  Constitutional:      General: She is not in acute distress.    Appearance: Normal appearance. She is well-developed. She is not toxic-appearing.     Comments: Afebrile, nontoxic, NAD  HENT:     Head: Normocephalic and atraumatic.  Eyes:     General:        Right eye: No discharge.        Left eye: No discharge.     Conjunctiva/sclera: Conjunctivae normal.  Neck:     Musculoskeletal: Normal range of motion and neck supple.  Cardiovascular:     Rate and Rhythm: Normal rate and regular rhythm.     Pulses: Normal pulses.     Heart sounds: Normal heart sounds, S1 normal and S2 normal. No murmur. No friction rub. No gallop.   Pulmonary:     Effort: Pulmonary effort is normal. No respiratory distress.     Breath sounds: Normal breath sounds. No decreased breath sounds, wheezing, rhonchi or rales.  Abdominal:     General: Bowel sounds are normal. There is no distension.     Palpations: Abdomen is soft. Abdomen is not rigid.     Tenderness: There is abdominal tenderness in the right lower quadrant. There is no right CVA tenderness, left CVA tenderness, guarding or rebound. Negative signs include Murphy's sign and McBurney's sign.     Comments:  Soft, obese but  nondistended, +BS throughout, with mild RLQ TTP, no r/g/r, neg murphy's, neg mcburney's, no CVA TTP   Genitourinary:    Rectum: External hemorrhoid (old external hemorrhoidal skin tag) present. No anal fissure.     Comments: Chaperone present during exam Skin along the gluteal cleft slightly macerated with aquaphor cream noted overlying the areas. Mildly TTP. No vesicles or rashes. No drainage of the skin. Mildly erythematous but not warm to touch. No abscesses noted. No anal fissures noted, old ext hemorrhoidal skin tag noted but no bleeding from this area. DRE not performed.  Musculoskeletal: Normal range of motion.  Skin:    General: Skin is warm and dry.     Findings: No rash.  Neurological:     Mental Status: She is alert and oriented to person, place, and time.     Sensory: Sensation is intact. No sensory deficit.     Motor: Motor function is intact.  Psychiatric:        Mood and Affect: Mood and affect normal.        Behavior: Behavior normal.      ED Treatments / Results  Labs (all labs ordered are listed, but only abnormal results are displayed) Labs Reviewed  CBC WITH DIFFERENTIAL/PLATELET - Abnormal; Notable for the following components:      Result Value   WBC 3.6 (*)    All other components within normal limits  COMPREHENSIVE METABOLIC PANEL - Abnormal; Notable for the following components:   Sodium 128 (*)    Potassium 3.1 (*)    Chloride 93 (*)    Glucose, Bld 659 (*)    Albumin 3.2 (*)    AST 12 (*)    All other components within normal limits  URINALYSIS, ROUTINE W REFLEX MICROSCOPIC - Abnormal; Notable for the following components:   Glucose, UA >=500 (*)    Ketones, ur 5 (*)    All other components within normal limits  CBG MONITORING, ED - Abnormal; Notable for the following components:   Glucose-Capillary >600 (*)    All other components within normal limits  POCT I-STAT EG7 - Abnormal; Notable for the following components:   pH, Ven 7.445 (*)     pCO2, Ven 40.9 (*)    pO2, Ven 182.0 (*)    Bicarbonate 28.1 (*)    Acid-Base Excess 4.0 (*)    Sodium 130 (*)    Potassium 3.1 (*)    Calcium, Ion 1.12 (*)    All other components within normal limits  CBG MONITORING, ED - Abnormal; Notable for the following components:   Glucose-Capillary 514 (*)    All other components within normal limits  CBG MONITORING, ED - Abnormal; Notable for the following components:   Glucose-Capillary 234 (*)    All other components within normal limits  LIPASE, BLOOD  I-STAT BETA HCG BLOOD, ED ([facility], [facility], [facility] ONLY)    EKG None  Radiology No results found.   Ct Renal Stone Study  Result Date: 01/08/2019 CLINICAL DATA:  Bilateral flank pain for the past 2 days. EXAM: CT ABDOMEN AND PELVIS WITHOUT CONTRAST TECHNIQUE: Multidetector CT imaging of the abdomen and pelvis was performed following the standard protocol without IV contrast. COMPARISON:  12/27/2018. FINDINGS: Lower chest: Clear lung bases. Hepatobiliary: No focal liver abnormality is seen. No gallstones, gallbladder wall thickening, or biliary dilatation. Pancreas: Unremarkable. No pancreatic ductal dilatation or surrounding inflammatory changes. Spleen: Normal in size without focal abnormality. Adrenals/Urinary Tract: Stable small low density  right adrenal mass measuring 1.9 x 1.1 cm on image number 29 series 3. Normal appearing left adrenal gland, kidneys, ureters and urinary bladder. No urinary tract calculi or hydronephrosis. Stomach/Bowel: Unremarkable stomach, small bowel and colon. Surgically absent appendix. Vascular/Lymphatic: No significant vascular findings are present. No enlarged abdominal or pelvic lymph nodes. Reproductive: Intrauterine device in expected position in the uterus. No adnexal abnormalities seen. Other: Midline surgical scar. Musculoskeletal: Mild lower thoracic spine degenerative changes. IMPRESSION: 1. No acute abnormality. 2. Stable small right probable adrenal adenoma.  Electronically Signed   By: Claudie Revering M.D.   On: 01/08/2019 14:30   Ct Renal Stone Study  Result Date: 12/27/2018 CLINICAL DATA:  Flank pain. EXAM: CT ABDOMEN AND PELVIS WITHOUT CONTRAST TECHNIQUE: Multidetector CT imaging of the abdomen and pelvis was performed following the standard protocol without IV contrast. COMPARISON:  CT 10/28/2018 FINDINGS: Lower chest: Unremarkable. Hepatobiliary: Superior dome of the liver is excluded from the field of view. Small cyst versus focal fatty infiltration adjacent to the falciform ligament. No suspicious hepatic lesion. Gallbladder is decompressed. No calcified gallstone. No biliary dilatation. Pancreas: No ductal dilatation or inflammation. Spleen: Normal in size without focal abnormality. Adrenals/Urinary Tract: Unchanged 13 mm right adrenal adenoma. Normal left adrenal gland. No renal stones or hydronephrosis. No perinephric edema. Both ureters are decompressed without stones along the course. Urinary bladder is physiologically distended. No bladder wall thickening or stone. Stomach/Bowel: Ingested material distends the stomach. No evidence of bowel inflammation, obstruction, or wall thickening allowing for lack of enteric contrast. Moderate stool burden in the colon. No colonic wall thickening or inflammatory change. Colonic diverticulosis on prior exam is not as well appreciated currently. Appendix not visualized, patient with history of appendectomy. Vascular/Lymphatic: Normal course and caliber of noncontrast abdominopelvic vasculature. No adenopathy. Reproductive: IUD in the uterus, unchanged in position from prior exam. Ovaries symmetric in size. Other: No free air, free fluid, or intra-abdominal fluid collection. Postsurgical change of the anterior abdominal wall. Musculoskeletal: There are no acute or suspicious osseous abnormalities. IMPRESSION: No renal stones or obstructive uropathy. No acute abnormality in the abdomen/pelvis. Electronically Signed   By:  Keith Rake M.D.   On: 12/27/2018 21:09     Procedures Procedures (including critical care time)  Medications Ordered in ED Medications  sodium chloride 0.9 % bolus 2,000 mL (0 mLs Intravenous Stopped 01/19/19 1611)  morphine 4 MG/ML injection 4 mg (4 mg Intravenous Given 01/19/19 1233)  insulin aspart (novoLOG) injection 10 Units (10 Units Subcutaneous Given 01/19/19 1235)  potassium chloride SA (K-DUR,KLOR-CON) CR tablet 60 mEq (60 mEq Oral Given 01/19/19 1337)  insulin aspart (novoLOG) injection 15 Units (15 Units Subcutaneous Given 01/19/19 1359)  acetaminophen (TYLENOL) tablet 1,000 mg (1,000 mg Oral Given 01/19/19 1619)  morphine 4 MG/ML injection 4 mg (4 mg Intravenous Given 01/19/19 1618)     Initial Impression / Assessment and Plan / ED Course  I have reviewed the triage vital signs and the nursing notes.  Pertinent labs & imaging results that were available during my care of the patient were reviewed by me and considered in my medical decision making (see chart for details).        33 y.o. female here with multiple complaints.  Her main complaint is a rash to her butt cheeks which is painful but not itchy.  She has had similar rashes before just not in this area.  She reports alternating diarrhea and constipation, but mostly soft stools which is a chronic issue for  her.  She also reports lower belly pain when she has bowel movements.  On exam, mild RLQ tenderness without rebound or guarding, non-peritoneal, exam of her buttocks reveals macerated skin between the gluteal cleft.  Suspect intertrigo from moisture.  She was recently evaluated for her right sided pain and had a CT renal study on 01/08/19 which did not show any acute findings, also had one on 12/27/18 which was also unremarkable for acute findings.  She also had a CT renal study on 10/28/18 which showed diverticulosis without diverticulitis, pelvic U/S that day was unremarkable. Doubt need for repeat CT imaging or pelvic  imaging at this time. Abd discomfort could be from hyperglycemia vs IBS, doubt diverticulitis given reassuring recent CT imaging.  CBG >600. We will get labs, give fluids, pain medicine, insulin, and reassess shortly.  1:55 PM CBC w/diff fairly unremarkable. CMP with pseudohyponatremia, K 3.1 will replete, bicarb WNL, no anion gap, but gluc 659, otherwise remainder unremarkable, no evidence of DKA. Repeat CBG after insulin still 514, will repeat insulin dose. Lipase WNL. VBG reassuring, pH borderline high. BetaHCG neg. Awaiting U/A, will reassess shortly.   5:06 PM U/A without evidence of UTI, 5 ketones which appears to be a chronic finding for her, doubt DKA. Repeat CBG 234. Pt feeling better. Suspect abd pain is from IBS vs hyperglycemia, doubt diverticulitis, colitis, or other emergent pathology, doubt need for further emergent work up at this time. Will send home with nystatin powder for her buttock irritation, advised use of diaper rash creams and other OTC remedies for symptomatic relief. Discussed tylenol/motrin for pain. Will send home with bentyl. Advised BRAT diet. Discussed importance of use of home diabetes meds. F/up with PCP in 1wk for recheck and to discuss possibly adjusting her diabetes regimen since she is routinely having high sugars. I explained the diagnosis and have given explicit precautions to return to the ER including for any other new or worsening symptoms. The patient understands and accepts the medical plan as it's been dictated and I have answered their questions. Discharge instructions concerning home care and prescriptions have been given. The patient is STABLE and is discharged to home in good condition.    Final Clinical Impressions(s) / ED Diagnoses   Final diagnoses:  Intertrigo  Lower abdominal pain  Type 2 diabetes mellitus with hyperglycemia, with long-term current use of insulin (HCC)  Hypokalemia  Diarrhea, unspecified type  History of irritable bowel  syndrome    ED Discharge Orders         Ordered    nystatin (MYCOSTATIN/NYSTOP) powder     01/19/19 1706    dicyclomine (BENTYL) 20 MG tablet  3 times daily with meals PRN     01/19/19 [address] [place], [place] 01/19/19 1708    Pattricia Boss, MD 01/23/19 [phone]

## 2019-01-19 NOTE — ED Notes (Signed)
Pt refused to provide urine sample and refused tylenol. Pt said she wanted to be discharged. EDP notified.

## 2019-02-19 ENCOUNTER — Other Ambulatory Visit: Payer: Self-pay

## 2019-02-19 ENCOUNTER — Observation Stay (HOSPITAL_COMMUNITY)
Admission: EM | Admit: 2019-02-19 | Discharge: 2019-02-21 | Disposition: A | Discharge status: Home / Self Care | Payer: Medicaid Other | Attending: Internal Medicine | Admitting: Internal Medicine

## 2019-02-19 ENCOUNTER — Encounter (HOSPITAL_COMMUNITY): Payer: Self-pay

## 2019-02-19 DIAGNOSIS — Z794 Long term (current) use of insulin: Secondary | ICD-10-CM | POA: Insufficient documentation

## 2019-02-19 DIAGNOSIS — K625 Hemorrhage of anus and rectum: Secondary | ICD-10-CM

## 2019-02-19 DIAGNOSIS — Z8249 Family history of ischemic heart disease and other diseases of the circulatory system: Secondary | ICD-10-CM | POA: Insufficient documentation

## 2019-02-19 DIAGNOSIS — K580 Irritable bowel syndrome with diarrhea: Secondary | ICD-10-CM | POA: Diagnosis not present

## 2019-02-19 DIAGNOSIS — E871 Hypo-osmolality and hyponatremia: Secondary | ICD-10-CM

## 2019-02-19 DIAGNOSIS — E876 Hypokalemia: Secondary | ICD-10-CM | POA: Insufficient documentation

## 2019-02-19 DIAGNOSIS — Z833 Family history of diabetes mellitus: Secondary | ICD-10-CM | POA: Diagnosis not present

## 2019-02-19 DIAGNOSIS — F1410 Cocaine abuse, uncomplicated: Secondary | ICD-10-CM | POA: Diagnosis not present

## 2019-02-19 DIAGNOSIS — R195 Other fecal abnormalities: Secondary | ICD-10-CM | POA: Insufficient documentation

## 2019-02-19 DIAGNOSIS — N762 Acute vulvitis: Secondary | ICD-10-CM | POA: Diagnosis present

## 2019-02-19 DIAGNOSIS — Z881 Allergy status to other antibiotic agents status: Secondary | ICD-10-CM | POA: Diagnosis not present

## 2019-02-19 DIAGNOSIS — Z793 Long term (current) use of hormonal contraceptives: Secondary | ICD-10-CM | POA: Insufficient documentation

## 2019-02-19 DIAGNOSIS — Z888 Allergy status to other drugs, medicaments and biological substances status: Secondary | ICD-10-CM | POA: Insufficient documentation

## 2019-02-19 DIAGNOSIS — Z886 Allergy status to analgesic agent status: Secondary | ICD-10-CM | POA: Diagnosis not present

## 2019-02-19 DIAGNOSIS — I10 Essential (primary) hypertension: Secondary | ICD-10-CM | POA: Diagnosis not present

## 2019-02-19 DIAGNOSIS — M62838 Other muscle spasm: Secondary | ICD-10-CM | POA: Insufficient documentation

## 2019-02-19 DIAGNOSIS — E1110 Type 2 diabetes mellitus with ketoacidosis without coma: Secondary | ICD-10-CM | POA: Diagnosis not present

## 2019-02-19 DIAGNOSIS — N751 Abscess of Bartholin's gland: Secondary | ICD-10-CM | POA: Insufficient documentation

## 2019-02-19 DIAGNOSIS — Z86711 Personal history of pulmonary embolism: Secondary | ICD-10-CM | POA: Diagnosis not present

## 2019-02-19 DIAGNOSIS — R1084 Generalized abdominal pain: Secondary | ICD-10-CM | POA: Insufficient documentation

## 2019-02-19 DIAGNOSIS — F17210 Nicotine dependence, cigarettes, uncomplicated: Secondary | ICD-10-CM | POA: Insufficient documentation

## 2019-02-19 DIAGNOSIS — G894 Chronic pain syndrome: Secondary | ICD-10-CM | POA: Insufficient documentation

## 2019-02-19 DIAGNOSIS — Z79899 Other long term (current) drug therapy: Secondary | ICD-10-CM | POA: Insufficient documentation

## 2019-02-19 LAB — COMPREHENSIVE METABOLIC PANEL
ALT: 19 U/L (ref 0–44)
AST: 22 U/L (ref 15–41)
Albumin: 3.9 g/dL (ref 3.5–5.0)
Alkaline Phosphatase: 85 U/L (ref 38–126)
Anion gap: 18 — ABNORMAL HIGH (ref 5–15)
BUN: 10 mg/dL (ref 6–20)
CO2: 15 mmol/L — ABNORMAL LOW (ref 22–32)
Calcium: 8.9 mg/dL (ref 8.9–10.3)
Chloride: 96 mmol/L — ABNORMAL LOW (ref 98–111)
Creatinine, Ser: 0.9 mg/dL (ref 0.44–1.00)
GFR calc Af Amer: >60 mL/min (ref >60)
GFR calc non Af Amer: >60 mL/min (ref >60)
Glucose, Bld: 499 mg/dL — ABNORMAL HIGH (ref 70–99)
Potassium: 4.7 mmol/L (ref 3.5–5.1)
Sodium: 129 mmol/L — ABNORMAL LOW (ref 135–145)
Total Bilirubin: 1.3 mg/dL — ABNORMAL HIGH (ref 0.3–1.2)
Total Protein: 8.6 g/dL — ABNORMAL HIGH (ref 6.5–8.1)

## 2019-02-19 LAB — URINALYSIS, ROUTINE W REFLEX MICROSCOPIC
Bacteria, UA: NONE SEEN
Bilirubin Urine: NEGATIVE
Glucose, UA: >=500 mg/dL — AB
Hgb urine dipstick: NEGATIVE
Ketones, ur: 80 mg/dL — AB
Leukocytes,Ua: NEGATIVE
Nitrite: NEGATIVE
Protein, ur: 100 mg/dL — AB
Specific Gravity, Urine: 1.031 — ABNORMAL HIGH (ref 1.005–1.030)
pH: 5 (ref 5.0–8.0)

## 2019-02-19 LAB — CBC
HCT: 45.4 % (ref 36.0–46.0)
Hemoglobin: 14.5 g/dL (ref 12.0–15.0)
MCH: 27.7 pg (ref 26.0–34.0)
MCHC: 31.9 g/dL (ref 30.0–36.0)
MCV: 86.8 fL (ref 80.0–100.0)
Platelets: 333 10*3/uL (ref 150–400)
RBC: 5.23 MIL/uL — ABNORMAL HIGH (ref 3.87–5.11)
RDW: 14 % (ref 11.5–15.5)
WBC: 8.2 10*3/uL (ref 4.0–10.5)
nRBC: 0 % (ref 0.0–0.2)

## 2019-02-19 LAB — I-STAT BETA HCG BLOOD, ED (MC, WL, AP ONLY): I-stat hCG, quantitative: <5 m[IU]/mL (ref <5)

## 2019-02-19 LAB — LACTIC ACID, PLASMA: Lactic Acid, Venous: 1.2 mmol/L (ref 0.5–1.9)

## 2019-02-19 LAB — LIPASE, BLOOD: Lipase: 23 U/L (ref 11–51)

## 2019-02-19 LAB — POC OCCULT BLOOD, ED: Fecal Occult Bld: POSITIVE — AB

## 2019-02-19 MED ORDER — DEXTROSE-NACL 5-0.45 % IV SOLN: INTRAVENOUS | Status: DC

## 2019-02-19 MED ORDER — FAMOTIDINE IN NACL 20-0.9 MG/50ML-% IV SOLN
20.0000 mg | Freq: Once | INTRAVENOUS | Status: AC
  Administered 2019-02-19: 20 mg via INTRAVENOUS
  Filled 2019-02-19 (×3): qty 50

## 2019-02-19 MED ORDER — POTASSIUM CHLORIDE 10 MEQ/100ML IV SOLN
10.0000 meq | INTRAVENOUS | Status: DC
  Administered 2019-02-19 – 2019-02-20 (×2): 10 meq via INTRAVENOUS
  Filled 2019-02-19 (×2): qty 100

## 2019-02-19 MED ORDER — SODIUM CHLORIDE 0.9 % IV BOLUS
1000.0000 mL | Freq: Once | INTRAVENOUS | Status: AC
  Administered 2019-02-19: 1000 mL via INTRAVENOUS

## 2019-02-19 MED ORDER — INSULIN REGULAR(HUMAN) IN NACL 100-0.9 UT/100ML-% IV SOLN
INTRAVENOUS | Status: DC
  Administered 2019-02-20: 4.4 [IU]/h via INTRAVENOUS
  Filled 2019-02-19: qty 100

## 2019-02-19 MED ORDER — HALOPERIDOL LACTATE 5 MG/ML IJ SOLN
5.0000 mg | Freq: Once | INTRAMUSCULAR | Status: AC
  Administered 2019-02-19: 5 mg via INTRAVENOUS
  Filled 2019-02-19: qty 1

## 2019-02-19 NOTE — ED Notes (Signed)
RT at bedside.

## 2019-02-19 NOTE — ED Notes (Signed)
Bed: XF58 Expected date:  Expected time:  Means of arrival:  Comments: EMS 33 yo female abd pain x 2 days-"loss of taste and smell" CBG 396 140/98

## 2019-02-19 NOTE — ED Triage Notes (Addendum)
Pt BIB [facility] from Home. Pt c/o pf abd pain and body cramps x2 days. Pt reports the no taste or smell and also reports diarrhea. Pt CBG is 396, however pt reports this is normal for her. Denies N/V, CP, cough, congestion, or fevers.

## 2019-02-19 NOTE — ED Provider Notes (Signed)
[facility] DEPT Provider Note   CSN: [id number] Arrival date & time: 02/19/19  2209    History   Chief Complaint Chief Complaint  Patient presents with   Abdominal Pain    HPI Jodi Mcgee is a 33 y.o. female with history of uncontrolled diabetes mellitus type 2, GERD, IBS, PE during puerperium, ovarian cyst, h/o cocaine abuse, chronic pain syndrome, and anxiety who presents to the emergency department by EMS with a chief complaint abdominal pain.  The patient endorses diffuse abdominal pain that began 2 days ago.  She reports "dull" pain around the periumbilical wound that seems to radiate outward to her entire abdomen.  The pain has been constant, but will wax and wane.  She reports that when the pain increases in intensity that she will also feel "sharp" pain throughout the abdomen.   She denies nausea or vomiting.  She has not checked her temperature at home, but reports that she has been feeling "hot and cold".  She has treated her pain with Tylenol and reports that she has taken 2 tablets of 100 mg of Tylenol as frequently as every 2 hours over the last few days.  She is unsure of the milligrams of the tablets she has been taking. She is allergic to NSAIDs.   She reports a chronic history of frequent alternation between constipation and diarrhea.  At baseline, she also notes that she has intermittently has a small amount of dark red blood on the toilet tissue with wiping and red streaks in the toilet. She has noted an incremental increase in the amount of blood over the last few days. No clots or black stool.   She also reports that she noticed a "knot" near her right groin about a week ago that has become increasingly painful and large since onset.  She thinks it might have started as an ingrown hair. No drainage.  She denies vaginal pain or itching.  No rectal pain.   Surgical history includes ruptured appendectomy and caesarian section. She also  reports she had a small bowel repair secondary to perforation in 2014.    She reports that she takes 20 units of Levemir twice daily, 5 mg of glyburide, and one thousand milligrams of metformin twice daily.  Last dose was at 20:00.  She reports compliance with her home medications without recent missed doses.  She reports that she has been unable to follow-up with her primary care provider because she is concerned about going to a medical office since she has a young child at home.  No recent sick contacts or suspected COVID-19 contacts.     The history is provided by the patient. No language interpreter was used.    Past Medical History:  Diagnosis Date   Anemia    Anxiety    Blood transfusion without reported diagnosis    both CS   Diabetes mellitus without complication (HCC)    GERD (gastroesophageal reflux disease)    has resolved   IBS (irritable bowel syndrome)    Ovarian cyst    Peritonitis, acute generalized ([place])    Pulmonary embolism ([place])    Sepsis ([place])     Patient Active Problem List   Diagnosis Date Noted   DKA (diabetic ketoacidoses) ([place]) 02/20/2019   Cellulitis of labia 02/20/2019   Blood per rectum 02/20/2019   Uncontrolled type 2 DM with hyperosmolar nonketotic hyperglycemia ([place]) 09/26/2018   Hyponatremia 09/26/2018   Uncontrolled type 2 diabetes mellitus with hyperosmolar  nonketotic hyperglycemia ([place]) 09/26/2018   History of cesarean section, classical 01/03/2018   Chronic hypertension 01/03/2018   IUD contraception 01/03/2018   Postpartum care and examination 01/03/2018   Pulmonary embolism during puerperium 12/04/2017   Pulmonary edema 11/30/2017   S/P cesarean section 11/21/2017   Pelvic adhesive disease 10/22/2017   Nasal septal perforation 10/17/2017   History of cocaine abuse ([place]) 10/17/2017   Epistaxis 10/17/2017   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   Obesity (BMI 30-39.9) 09/25/2017   Hypertension in  pregnancy, preeclampsia, severe, delivered/postpartum 09/13/2017   Previous cesarean section complicating pregnancy 46/28/6381   Type 2 diabetes mellitus, without long-term current use of insulin ([place]) 08/23/2017   Chronic pain syndrome 08/23/2017    Past Surgical History:  Procedure Laterality Date   APPENDECTOMY     ruptured    BUNIONECTOMY     CESAREAN SECTION     CESAREAN SECTION Bilateral 11/20/2017   Procedure: CESAREAN SECTION;  Surgeon: Sloan Leiter, MD;  Location: [facility];  Service: Obstetrics;  Laterality: Bilateral;   OVARIAN CYST DRAINAGE     SMALL BOWEL REPAIR       OB History    Gravida  3   Para  3   Term  1   Preterm  2   AB  0   Living  3     SAB  0   TAB  0   Ectopic  0   Multiple  0   Live Births  3            Home Medications    Prior to Admission medications   Medication Sig Start Date End Date Taking? Authorizing Provider  acetaminophen (TYLENOL) 500 MG tablet Take 1,000 mg by mouth daily as needed for moderate pain.    [provider]  blood glucose meter kit and supplies KIT Dispense based on patient and insurance preference. Use up to four times daily as directed. (FOR ICD-9 250.00, 250.01). 09/27/18   Charlynne Cousins, MD  clindamycin (CLEOCIN) 2 % vaginal cream Place 1 Applicatorful vaginally at bedtime. Patient not taking: Reported on 04/28/1164 7/90/38   Delora Fuel, MD  dicyclomine (BENTYL) 20 MG tablet Take 1 tablet (20 mg total) by mouth 3 (three) times daily with meals as needed for spasms (abdominal spasms/cramping and/or diarrhea). 01/19/19   Street, Mercedes, PA-C  glipiZIDE (GLUCOTROL) 5 MG tablet Take 1 tablet (5 mg total) by mouth 2 (two) times daily before a meal. Patient taking differently: Take 5 mg by mouth daily.  09/27/18   Charlynne Cousins, MD  insulin detemir (LEVEMIR) 100 unit/ml SOLN Inject 0.2 mLs (20 Units total) into the skin 2 (two) times daily. 01/08/19   Little, Wenda Overland, MD  Levonorgestrel (LILETTA) 19.5 MCG/DAY IUD IUD 1 each by Intrauterine route once. 2019    [provider]  metFORMIN (GLUCOPHAGE) 1000 MG tablet Take 1 tablet (1,000 mg total) by mouth 2 (two) times daily with a meal. 09/12/18   Recardo Evangelist, PA-C  nystatin (MYCOSTATIN/NYSTOP) powder Apply to affected area three times daily for 10 days or until improved 01/19/19   Street, [place], [place]    Family History Family History  Problem Relation Age of Onset   Hypertension Mother    Anemia Mother    Diabetes Father     Social History Social History   Tobacco Use   Smoking status: Light Tobacco Smoker    Packs/day: 0.50    Types: Cigarettes  Smokeless tobacco: Never Used   Tobacco comment: social use with tobacco  Substance Use Topics   Alcohol use: Yes    Comment: social   Drug use: Not Currently     Allergies   Cetirizine & related; Toradol [ketorolac tromethamine]; Flagyl [metronidazole]; Contrast media [iodinated diagnostic agents]; and Nsaids   Review of Systems Review of Systems  Constitutional: Negative for activity change, chills and fever.  Respiratory: Negative for cough and shortness of breath.   Cardiovascular: Negative for chest pain, palpitations and leg swelling.  Gastrointestinal: Positive for abdominal pain, anal bleeding, blood in stool, constipation and diarrhea. Negative for abdominal distention, nausea and vomiting.  Genitourinary: Positive for genital sores, pelvic pain and vaginal discharge. Negative for decreased urine volume, dysuria, frequency, hematuria, urgency, vaginal bleeding and vaginal pain.  Musculoskeletal: Negative for back pain, myalgias, neck pain and neck stiffness.  Skin: Positive for wound. Negative for rash.  Allergic/Immunologic: Negative for immunocompromised state.  Neurological: Negative for dizziness, weakness, numbness and headaches.       [place]   Psychiatric/Behavioral: Negative for confusion.      Physical Exam Updated Vital Signs BP (!) 128/91    Pulse (!) 106    Temp 98.3 F (36.8 C) (Oral)    Resp 15    Ht _0  (1.702 m)    Wt 95.3 kg    SpO2 98%    BMI 32.89 kg/m   Physical Exam Vitals signs and nursing note reviewed.  Constitutional:      General: She is not in acute distress.    Appearance: She is obese. She is not ill-appearing, toxic-appearing or diaphoretic.  HENT:     Head: Normocephalic.  Eyes:     Conjunctiva/sclera: Conjunctivae normal.  Neck:     Musculoskeletal: Neck supple.  Cardiovascular:     Rate and Rhythm: Regular rhythm. Tachycardia present.     Heart sounds: No murmur. No friction rub. No gallop.   Pulmonary:     Effort: Pulmonary effort is normal. No respiratory distress.     Breath sounds: No stridor. No wheezing, rhonchi or rales.  Chest:     Chest wall: No tenderness.  Abdominal:     General: There is no distension.     Palpations: Abdomen is soft. There is no mass.     Tenderness: There is abdominal tenderness. There is guarding. There is no right CVA tenderness, left CVA tenderness or rebound.     Hernia: No hernia is present.     Comments: Diffusely tender to palpation throughout the abdomen.  Maximal tenderness is in the epigastric and right upper quadrant region with some guarding.  No rebound.  Negative Murphy sign.  Abdomen is soft, nondistended.  No tenderness over McBurney's point.  No CVA tenderness bilaterally.  Genitourinary:    Labia:        Right: Tenderness and lesion present.        Left: No tenderness.        Comments: Chaperoned exam.  Erythema and edema noted to the right labia majora with a moderately sized nodule.  This area is moderately tender to palpation.  Minimal fluctuance. There is a small pinpoint area coming to a head, but no active drainage or drainage open to be able to be expressed.  No tenderness over the mons pubis region or in the perineum. Bilateral inguinal lymphadenopathy is present.   There is a  single nonthrombosed external hemorrhoid.  Normal rectal tone.  Small amount of green-colored stool  present on DRE. Musculoskeletal:     Right lower leg: No edema.     Left lower leg: No edema.  Skin:    General: Skin is warm.     Capillary Refill: Capillary refill takes less than 2 seconds.     Findings: Erythema present.  Neurological:     Mental Status: She is alert and oriented to person, place, and time.  Psychiatric:        Behavior: Behavior normal.      ED Treatments / Results  Labs (all labs ordered are listed, but only abnormal results are displayed) Labs Reviewed  CBC - Abnormal; Notable for the following components:      Result Value   RBC 5.23 (*)    All other components within normal limits  COMPREHENSIVE METABOLIC PANEL - Abnormal; Notable for the following components:   Sodium 129 (*)    Chloride 96 (*)    CO2 15 (*)    Glucose, Bld 499 (*)    Total Protein 8.6 (*)    Total Bilirubin 1.3 (*)    Anion gap 18 (*)    All other components within normal limits  URINALYSIS, ROUTINE W REFLEX MICROSCOPIC - Abnormal; Notable for the following components:   Color, Urine STRAW (*)    Specific Gravity, Urine 1.031 (*)    Glucose, UA >=500 (*)    Ketones, ur 80 (*)    Protein, ur 100 (*)    All other components within normal limits  BLOOD GAS, ARTERIAL - Abnormal; Notable for the following components:   pCO2 arterial 30.6 (*)    Bicarbonate 16.6 (*)    Acid-base deficit 7.4 (*)    All other components within normal limits  POC OCCULT BLOOD, ED - Abnormal; Notable for the following components:   Fecal Occult Bld POSITIVE (*)    All other components within normal limits  CULTURE, BLOOD (ROUTINE X 2)  CULTURE, BLOOD (ROUTINE X 2)  LIPASE, BLOOD  CBC WITH DIFFERENTIAL/PLATELET  LACTIC ACID, PLASMA  CK  ACETAMINOPHEN LEVEL  LACTIC ACID, PLASMA  RAPID URINE DRUG SCREEN, HOSP PERFORMED  BASIC METABOLIC PANEL  BASIC METABOLIC PANEL  BASIC METABOLIC PANEL    HEMOGLOBIN A1C  I-STAT BETA HCG BLOOD, ED ([facility], [facility], [facility] ONLY)    EKG EKG Interpretation  Date/Time:  Wednesday February 19 2019 23:15:11 EDT Ventricular Rate:  108 PR Interval:    QRS Duration: 91 QT Interval:  330 QTC Calculation: 443 R Axis:   78 Text Interpretation:  Sinus tachycardia Probable left atrial enlargement Borderline T abnormalities, Confirmed by Randal Buba, April ([id number]) on 02/19/2019 11:41:12 PM   Radiology No results found.  Procedures .Critical Care Performed by: Joanne Gavel, PA-C Authorized by: Joanne Gavel, PA-C   Critical care provider statement:    Critical care time (minutes):  35   Critical care time was exclusive of:  Separately billable procedures and treating other patients and teaching time   Critical care was necessary to treat or prevent imminent or life-threatening deterioration of the following conditions:  Metabolic crisis   Critical care was time spent personally by me on the following activities:  Ordering and performing treatments and interventions, ordering and review of laboratory studies, ordering and review of radiographic studies, re-evaluation of patient's condition, review of old charts, obtaining history from patient or surrogate, evaluation of patient's response to treatment, development of treatment plan with patient or surrogate and examination of patient   (including critical care time)  Medications  Ordered in ED Medications  insulin regular, human (MYXREDLIN) 100 units/ 100 mL infusion (2.4 Units/hr Intravenous Rate/Dose Change 02/20/19 0120)  sodium chloride flush (NS) 0.9 % injection 3 mL (has no administration in time range)  traMADol (ULTRAM) tablet 50 mg (has no administration in time range)  ondansetron (ZOFRAN) tablet 4 mg (has no administration in time range)    Or  ondansetron (ZOFRAN) injection 4 mg (has no administration in time range)  cefTRIAXone (ROCEPHIN) 1 g in sodium chloride 0.9 % 100 mL IVPB (has no  administration in time range)  0.9 %  sodium chloride infusion (has no administration in time range)  dextrose 5 %-0.45 % sodium chloride infusion (has no administration in time range)  sodium chloride 0.9 % bolus 1,000 mL (1,000 mLs Intravenous New Bag/Given 02/19/19 2305)  famotidine (PEPCID) IVPB 20 mg premix (0 mg Intravenous Stopped 02/20/19 0111)  haloperidol lactate (HALDOL) injection 5 mg (5 mg Intravenous Given 02/19/19 2349)     Initial Impression / Assessment and Plan / ED Course  I have reviewed the triage vital signs and the nursing notes.  Pertinent labs & imaging results that were available during my care of the patient were reviewed by me and considered in my medical decision making (see chart for details).        33 year old female with history of uncontrolled diabetes mellitus type 2, GERD, IBS, PE during puerperium, ovarian cyst, h/o cocaine abuse, chronic pain syndrome, and anxiety presenting with abdominal pain and right groin complaints.  The patient was discussed with Dr. Lars Masson, attending physician.  The patient is well-known to this ER with 10 visits in the last 6 months.  She is a poorly controlled type II diabetic with multiple other chronic medical comorbidities.  Her baseline sugars are in the 300-400s, but she frequently presents to the ER with CBGs in the 700-800s.   She has had no vomiting.  She has had some diarrhea, but has a chronic history of constipation and diarrhea from IBS.  She also has a history of chronic rectal bleeding, but does report there has been an incremental increase in dark red blood over the last few days.  She declines constitutional symptoms, but notes that she has been frequently taking Tylenol over the last few days.   On exam, she does not have a surgical abdomen.  Per chart review, the patient has had numerous imaging studies performed.  She does have cellulitis to the right labia majora with a developing abscess, but this area  is not amenable to I&D at this time.  Given the location of developing infection with her longstanding history of diarrhea will treat with vancomycin and Zosyn.  Blood cultures x2 have been ordered.  I have a low suspicion for Fournier's gangrene or deep space infection at this time.  However, given the patient's history of poorly controlled diabetes mellitus, I think she warrants close monitoring to ensure that infection does not worsen.  Glucose is 499 with bicarb of 15 and anion gap of 18.  ABG with pH of 7.35.  DKA glucose stabilizer has been ordered.  UA concerning for dehydration.  She is also been given a bolus of IV fluids.  She was also hyponatremic at 129.  Hemoccult is positive.  Per chart review and based on history taking, this appears chronic.  Hemoglobin is stable at 14.5.   Her pain is treated with Pepcid and on reevaluation she had minimal improvement.  She was given Haldol and on  recheck was much improved.  Given DKA with poorly controlled blood sugar in the setting of infection, the hospitalist team was contacted for admission.  Dr. Posey Pronto has accepted the patient for admission. The patient appears reasonably stabilized for admission considering the current resources, flow, and capabilities available in the ED at this time, and I doubt any other [facility] requiring further screening and/or treatment in the ED prior to admission.   Final Clinical Impressions(s) / ED Diagnoses   Final diagnoses:  Diabetic ketoacidosis without coma associated with type 2 diabetes mellitus ([place])  Hyponatremia  Cellulitis of labia majora  Rectal bleeding    ED Discharge Orders    None       Joanne Gavel, PA-C 02/20/19 0122    Palumbo, April, MD 02/20/19 [id number]

## 2019-02-20 DIAGNOSIS — K625 Hemorrhage of anus and rectum: Secondary | ICD-10-CM

## 2019-02-20 DIAGNOSIS — E1110 Type 2 diabetes mellitus with ketoacidosis without coma: Secondary | ICD-10-CM | POA: Diagnosis not present

## 2019-02-20 DIAGNOSIS — N762 Acute vulvitis: Secondary | ICD-10-CM

## 2019-02-20 LAB — BASIC METABOLIC PANEL
Anion gap: 9 (ref 5–15)
Anion gap: 9 (ref 5–15)
BUN: 8 mg/dL (ref 6–20)
BUN: 8 mg/dL (ref 6–20)
CO2: 22 mmol/L (ref 22–32)
CO2: 21 mmol/L — ABNORMAL LOW (ref 22–32)
Calcium: 8.7 mg/dL — ABNORMAL LOW (ref 8.9–10.3)
Calcium: 8.7 mg/dL — ABNORMAL LOW (ref 8.9–10.3)
Chloride: 105 mmol/L (ref 98–111)
Chloride: 105 mmol/L (ref 98–111)
Creatinine, Ser: 0.61 mg/dL (ref 0.44–1.00)
Creatinine, Ser: 0.63 mg/dL (ref 0.44–1.00)
GFR calc Af Amer: >60 mL/min (ref >60)
GFR calc Af Amer: >60 mL/min (ref >60)
GFR calc non Af Amer: >60 mL/min (ref >60)
GFR calc non Af Amer: >60 mL/min (ref >60)
Glucose, Bld: 195 mg/dL — ABNORMAL HIGH (ref 70–99)
Glucose, Bld: 228 mg/dL — ABNORMAL HIGH (ref 70–99)
Potassium: 3.5 mmol/L (ref 3.5–5.1)
Potassium: 3.2 mmol/L — ABNORMAL LOW (ref 3.5–5.1)
Sodium: 136 mmol/L (ref 135–145)
Sodium: 135 mmol/L (ref 135–145)

## 2019-02-20 LAB — CBC WITH DIFFERENTIAL/PLATELET
Abs Immature Granulocytes: 0.02 10*3/uL (ref 0.00–0.07)
Basophils Absolute: 0 10*3/uL (ref 0.0–0.1)
Basophils Relative: 0 %
Eosinophils Absolute: 0 10*3/uL (ref 0.0–0.5)
Eosinophils Relative: 0 %
HCT: 44.1 % (ref 36.0–46.0)
Hemoglobin: 13.9 g/dL (ref 12.0–15.0)
Immature Granulocytes: 0 %
Lymphocytes Relative: 17 %
Lymphs Abs: 1.1 10*3/uL (ref 0.7–4.0)
MCH: 27.5 pg (ref 26.0–34.0)
MCHC: 31.5 g/dL (ref 30.0–36.0)
MCV: 87.2 fL (ref 80.0–100.0)
Monocytes Absolute: 0.6 10*3/uL (ref 0.1–1.0)
Monocytes Relative: 9 %
Neutro Abs: 4.9 10*3/uL (ref 1.7–7.7)
Neutrophils Relative %: 74 %
Platelets: 269 10*3/uL (ref 150–400)
RBC: 5.06 MIL/uL (ref 3.87–5.11)
RDW: 13.9 % (ref 11.5–15.5)
WBC: 6.7 10*3/uL (ref 4.0–10.5)
nRBC: 0 % (ref 0.0–0.2)

## 2019-02-20 LAB — BLOOD GAS, ARTERIAL
Acid-base deficit: 7.4 mmol/L — ABNORMAL HIGH (ref 0.0–2.0)
Bicarbonate: 16.6 mmol/L — ABNORMAL LOW (ref 20.0–28.0)
Drawn by: 422461
FIO2: 21
O2 Saturation: 97.3 %
Patient temperature: 98.3
pCO2 arterial: 30.6 mmHg — ABNORMAL LOW (ref 32.0–48.0)
pH, Arterial: 7.354 (ref 7.350–7.450)
pO2, Arterial: 96.7 mmHg (ref 83.0–108.0)

## 2019-02-20 LAB — ACETAMINOPHEN LEVEL: Acetaminophen (Tylenol), Serum: <10 ug/mL — ABNORMAL LOW (ref 10–30)

## 2019-02-20 LAB — CBG MONITORING, ED
Glucose-Capillary: 303 mg/dL — ABNORMAL HIGH (ref 70–99)
Glucose-Capillary: 279 mg/dL — ABNORMAL HIGH (ref 70–99)
Glucose-Capillary: 320 mg/dL — ABNORMAL HIGH (ref 70–99)
Glucose-Capillary: 241 mg/dL — ABNORMAL HIGH (ref 70–99)

## 2019-02-20 LAB — RAPID URINE DRUG SCREEN, HOSP PERFORMED
Amphetamines: NOT DETECTED
Barbiturates: NOT DETECTED
Benzodiazepines: NOT DETECTED
Cocaine: POSITIVE — AB
Opiates: NOT DETECTED
Tetrahydrocannabinol: NOT DETECTED

## 2019-02-20 LAB — GLUCOSE, CAPILLARY
Glucose-Capillary: 178 mg/dL — ABNORMAL HIGH (ref 70–99)
Glucose-Capillary: 160 mg/dL — ABNORMAL HIGH (ref 70–99)
Glucose-Capillary: 143 mg/dL — ABNORMAL HIGH (ref 70–99)
Glucose-Capillary: 207 mg/dL — ABNORMAL HIGH (ref 70–99)
Glucose-Capillary: 225 mg/dL — ABNORMAL HIGH (ref 70–99)
Glucose-Capillary: 153 mg/dL — ABNORMAL HIGH (ref 70–99)
Glucose-Capillary: 308 mg/dL — ABNORMAL HIGH (ref 70–99)
Glucose-Capillary: 404 mg/dL — ABNORMAL HIGH (ref 70–99)

## 2019-02-20 LAB — HEMOGLOBIN A1C
Hgb A1c MFr Bld: 8.2 % — ABNORMAL HIGH (ref 4.8–5.6)
Mean Plasma Glucose: 188.64 mg/dL

## 2019-02-20 LAB — MRSA PCR SCREENING: MRSA by PCR: NEGATIVE

## 2019-02-20 LAB — CK: Total CK: 67 U/L (ref 38–234)

## 2019-02-20 MED ORDER — CHLORHEXIDINE GLUCONATE CLOTH 2 % EX PADS
6.0000 | MEDICATED_PAD | Freq: Every day | CUTANEOUS | Status: DC
  Administered 2019-02-20: 6 via TOPICAL

## 2019-02-20 MED ORDER — INSULIN ASPART 100 UNIT/ML ~~LOC~~ SOLN
0.0000 [IU] | Freq: Three times a day (TID) | SUBCUTANEOUS | Status: DC
  Administered 2019-02-20: 11 [IU] via SUBCUTANEOUS
  Administered 2019-02-20: 3 [IU] via SUBCUTANEOUS
  Administered 2019-02-21: 15 [IU] via SUBCUTANEOUS
  Administered 2019-02-21: 5 [IU] via SUBCUTANEOUS

## 2019-02-20 MED ORDER — POTASSIUM CHLORIDE 10 MEQ/100ML IV SOLN: 10.0000 meq | INTRAVENOUS | Status: DC

## 2019-02-20 MED ORDER — INSULIN DETEMIR 100 UNIT/ML ~~LOC~~ SOLN
20.0000 [IU] | Freq: Every day | SUBCUTANEOUS | Status: DC
  Administered 2019-02-20 – 2019-02-21 (×2): 20 [IU] via SUBCUTANEOUS
  Filled 2019-02-20: qty 0.2
  Filled 2019-02-20: qty 3
  Filled 2019-02-20: qty 0.2

## 2019-02-20 MED ORDER — ONDANSETRON HCL 4 MG/2ML IJ SOLN
4.0000 mg | Freq: Four times a day (QID) | INTRAMUSCULAR | Status: DC | PRN
  Filled 2019-02-20: qty 2

## 2019-02-20 MED ORDER — LIVING WELL WITH DIABETES BOOK
Freq: Once | Status: AC
  Administered 2019-02-20: 22:00:00

## 2019-02-20 MED ORDER — SODIUM CHLORIDE 0.9 % IV SOLN
INTRAVENOUS | Status: DC
  Administered 2019-02-20: 01:00:00 via INTRAVENOUS

## 2019-02-20 MED ORDER — INSULIN ASPART 100 UNIT/ML ~~LOC~~ SOLN
5.0000 [IU] | Freq: Three times a day (TID) | SUBCUTANEOUS | Status: DC
  Administered 2019-02-20 – 2019-02-21 (×3): 5 [IU] via SUBCUTANEOUS

## 2019-02-20 MED ORDER — VANCOMYCIN HCL 10 G IV SOLR
2000.0000 mg | Freq: Once | INTRAVENOUS | Status: DC
  Filled 2019-02-20: qty 2000

## 2019-02-20 MED ORDER — CYCLOBENZAPRINE HCL 5 MG PO TABS
5.0000 mg | ORAL_TABLET | Freq: Three times a day (TID) | ORAL | Status: DC | PRN
  Administered 2019-02-20: 5 mg via ORAL
  Filled 2019-02-20: qty 1

## 2019-02-20 MED ORDER — PIPERACILLIN-TAZOBACTAM 3.375 G IVPB
3.3750 g | Freq: Once | INTRAVENOUS | Status: DC
  Filled 2019-02-20: qty 50

## 2019-02-20 MED ORDER — SODIUM CHLORIDE 0.9 % IV SOLN
1.0000 g | INTRAVENOUS | Status: DC
  Administered 2019-02-20 – 2019-02-21 (×2): 1 g via INTRAVENOUS
  Filled 2019-02-20: qty 10
  Filled 2019-02-20: qty 1
  Filled 2019-02-20: qty 10

## 2019-02-20 MED ORDER — ONDANSETRON HCL 4 MG PO TABS
4.0000 mg | ORAL_TABLET | Freq: Four times a day (QID) | ORAL | Status: DC | PRN
  Filled 2019-02-20: qty 1

## 2019-02-20 MED ORDER — KCL IN DEXTROSE-NACL 10-5-0.45 MEQ/L-%-% IV SOLN
INTRAVENOUS | Status: DC
  Administered 2019-02-20: 09:00:00 via INTRAVENOUS
  Filled 2019-02-20: qty 1000

## 2019-02-20 MED ORDER — SODIUM CHLORIDE 0.9% FLUSH
3.0000 mL | Freq: Two times a day (BID) | INTRAVENOUS | Status: DC
  Administered 2019-02-20 – 2019-02-21 (×3): 3 mL via INTRAVENOUS

## 2019-02-20 MED ORDER — DEXTROSE-NACL 5-0.45 % IV SOLN
INTRAVENOUS | Status: DC
  Administered 2019-02-20: 04:00:00 via INTRAVENOUS

## 2019-02-20 MED ORDER — TRAMADOL HCL 50 MG PO TABS
50.0000 mg | ORAL_TABLET | Freq: Four times a day (QID) | ORAL | Status: DC | PRN
  Administered 2019-02-20 – 2019-02-21 (×2): 50 mg via ORAL
  Filled 2019-02-20 (×3): qty 1

## 2019-02-20 MED ORDER — ENOXAPARIN SODIUM 40 MG/0.4ML ~~LOC~~ SOLN: 40.0000 mg | SUBCUTANEOUS | Status: DC

## 2019-02-20 NOTE — Progress Notes (Signed)
[facility] PROGRESS NOTE  [name] WCB:[id number] DOB: May 05, 1986 DOA: 02/19/2019 PCP: Wilber Oliphant, MD  Assessment/Plan: DKA in poorly controlled type II diabetic (A1c of 11 on 09/2018).  Patient admitted to stepdown unit for DKA protocol.  Anion gap currently closed at 9 on most recent BMP down from 18 and most recent glucose 195 down from 499 on admission.  Suspect flare like related to potential or an infection or diminished insulin related to chronic IBS.  Follow DKA protocol, monitor BMP to ensure anion gap closure, will start half of her home dose of Lantus 20 units and discontinue insulin drip 2 hours later, closely monitor glucose, make diabetic diet, sliding scale insulin, anticipate transition to floor if gap remains closed..  Currently on D5 half-normal with potassium supplementation saline given glucose less than 200, repeat A1c  Pseudohyponatremia secondary to hyperglycemia, improving.  Closely monitor BMP.  Nonpurulent cellulitis of labia.  Likely nidus of DKA.  Has remained afebrile, currently on ceftriaxone for strep coverage, doubt MRSA given nonpurulent, denies any recent sexual encounters (over several months and reports protection), will send for GC chlamydia for completion, following blood cultures.  Acute on chronic abdominal pain likely related to chronic IBS syndrome.  Other etiologies for abdominal pain have been unremarkable with normal lipase, no infection on UA, hCG unremarkable, acetaminophen level less than 10.  Hypokalemia.  Like related to insulin infusion.  Added additive potassium supplementation to IV fluids, closely monitor, may need oral supplementation as well.  Muscle spasms.  Last related to hypokalemia.  We will continue to supplement.  PRN Flexeril  History of substance abuse.  UDS positive for cocaine.  Consult social worker   Reports of blood per rectum.  Fecal occult positive in ED.  Seems to be chronic issue likely related to  hemorrhoids, will closely monitor hemoglobin Code Status: Full Code Family Communication: no family at bedside (indicate person spoken with, relationship, and if by phone, the number) Disposition Plan: likely discharge in 24 hours pending transition to home lantus, as well as eventual deescalation to oral antibiotics for labial cellulitis   Consultants:  none  Procedures:  none  Antibiotics:  Iv ceftriaxone, 4/30 (indicate start date, and stop date if known)  HPI/Subjective:  Jodi Mcgee is a 33 y.o. year old female with medical history significant for type 2 diabetes, IBS, chronic pain syndrome, substance abuse who presented on 02/19/2019 with 2 days of generalized abdominal pain with no nausea or vomiting for she is been taken Tylenol every 2 hours as well as noticing a "knot" near her right groin for the past week and was admitted with working diagnosis of DKA.  Reports muscle spasms in her legs which she states tends to happen when her blood sugars are high Still having pain in right labia  Objective: Vitals:   02/20/19 0445 02/20/19 0600  BP: 95/67 112/62  Pulse: (!) 103 91  Resp: 16 19  Temp:    SpO2: 97% 100%    Intake/Output Summary (Last 24 hours) at 02/20/2019 0739 Last data filed at 02/20/2019 0600 Gross per 24 hour  Intake 2819.72 ml  Output -  Net 2819.72 ml   Filed Weights   02/19/19 2224  Weight: 95.3 kg    Exam:   General: Lying in bed in no distress  Cardiovascular: Regular rate and rhythm, no murmurs, no edema  Respiratory: Normal respiratory effort on room air  Abdomen: Soft, nondistended, nontender  Musculoskeletal: Normal range of motion  GU: Right labia more swollen than left, tender to palpation, slightly erythematous, no open lesions or drainage  Neurologic no appreciable focal deficits  Data Reviewed: Basic Metabolic Panel: Recent Labs  Lab 02/19/19 2244 02/20/19 0456  NA 129* 136  K 4.7 3.5  CL 96* 105  CO2 15* 22   GLUCOSE 499* 195*  BUN 10 8  CREATININE 0.90 0.61  CALCIUM 8.9 8.7*   Liver Function Tests: Recent Labs  Lab 02/19/19 2244  AST 22  ALT 19  ALKPHOS 85  BILITOT 1.3*  PROT 8.6*  ALBUMIN 3.9   Recent Labs  Lab 02/19/19 2244  LIPASE 23   No results for input(s): AMMONIA in the last 168 hours. CBC: Recent Labs  Lab 02/19/19 2244 02/19/19 2254  WBC 8.2 6.7  NEUTROABS  --  4.9  HGB 14.5 13.9  HCT 45.4 44.1  MCV 86.8 87.2  PLT 333 269   Cardiac Enzymes: Recent Labs  Lab 02/19/19 2343  CKTOTAL 67   BNP (last 3 results) No results for input(s): BNP in the last 8760 hours.  ProBNP (last 3 results) No results for input(s): PROBNP in the last 8760 hours.  CBG: Recent Labs  Lab 02/20/19 0213 02/20/19 0314 02/20/19 0411 02/20/19 0534 02/20/19 0636  GLUCAP 279* 320* 241* 178* 160*    No results found for this or any previous visit (from the past 240 hour(s)).   Studies: No results found.  Scheduled Meds: . Chlorhexidine Gluconate Cloth  6 each Topical Daily  . sodium chloride flush  3 mL Intravenous Q12H   Continuous Infusions: . sodium chloride Stopped (02/20/19 0417)  . cefTRIAXone (ROCEPHIN)  IV Stopped (02/20/19 0153)  . dextrose 5 % and 0.45% NaCl 100 mL/hr at 02/20/19 0600  . insulin 3.5 mL/hr at 02/20/19 0600    Principal Problem:   DKA (diabetic ketoacidoses) ([place]) Active Problems:   Cellulitis of labia   Blood per rectum      Jodi Mcgee  [facility]

## 2019-02-20 NOTE — H&P (Signed)
History and Physical    MONIFAH FREEHLING DGL:[id number] DOB: May 29, 1986 DOA: 02/19/2019  PCP: Wilber Oliphant, MD  Patient coming from: Home  I have personally briefly reviewed patient's old medical records in [place]  Chief Complaint: Abdominal pain  HPI: Leita SUMAYA RIEDESEL is a 33 y.o. female with medical history significant for insulin-dependent type 2 diabetes, IBS, chronic pain syndrome, and substance use who presented to the ED with complaint of abdominal pain.  History from patient is limited due to excess somnolence after administration of Haldol in the ED, therefore majority of history is obtained from EDP and chart review.  Patient apparently had 2 days of dull generalized abdominal pain without nausea or vomiting.  She has been taking Tylenol up to every 2 hours for pain.  She has not been taking NSAIDs.    She has been having alternating constipation and diarrhea chronically from her IBS.  She reported seeing a small amount of dark red blood from her rectum intermittently when wiping with tissue paper.  Per EDP, she had not seen any clots or black stool at home.  She had also reported seeing a "knot" near her right groin which she first noticed about a week ago.  This has become more painful and increasing in size.  She has not seen any drainage or discharge.  ED Course:  Patient vitals showed BP 132/98, pulse 105, RR 18, temp 98.3 Fahrenheit, SPO2 98% on room air.  Labs are notable for sodium 129 (139 when corrected for hyperglycemia), potassium 4.7, chloride 96, bicarb 15, BUN 10, creatinine 0.90, serum glucose 499, anion gap 18, WBC 8.2, hemoglobin 14.5, platelets 333, lipase 23, acetaminophen level <10, i-STAT beta-hCG <5.0.  Lactic acid 1.2, CK 67.  ABG showed pH 7.354, PCO2 30.6, PO2 96.7 on FiO2 21% on room air.  Urinalysis showed 80 ketones, negative for nitrites and leukocytes with no bacteria on microscopy.  FOBT was obtained and positive.  Patient was given 1 L  normal saline, IV potassium 10 mEq x 2, and started on continuous insulin infusion.  She was ordered to have IV vancomycin and Zosyn.  The hospitalist service was consulted to admit for further evaluation management.   Review of Systems:  Unable to obtain due to excessive somnolence.   Past Medical History:  Diagnosis Date  . Anemia   . Anxiety   . Blood transfusion without reported diagnosis    both CS  . Diabetes mellitus without complication ([place])   . GERD (gastroesophageal reflux disease)    has resolved  . IBS (irritable bowel syndrome)   . Ovarian cyst   . Peritonitis, acute generalized ([place])   . Pulmonary embolism ([place])   . Sepsis [facility])     Past Surgical History:  Procedure Laterality Date  . APPENDECTOMY     ruptured   . BUNIONECTOMY    . CESAREAN SECTION    . CESAREAN SECTION Bilateral 11/20/2017   Procedure: CESAREAN SECTION;  Surgeon: Sloan Leiter, MD;  Location: [place] [facility];  Service: Obstetrics;  Laterality: Bilateral;  . OVARIAN CYST DRAINAGE    . SMALL BOWEL REPAIR       reports that she has been smoking cigarettes. She has been smoking about 0.50 packs per day. She has never used smokeless tobacco. She reports current alcohol use. She reports previous drug use.  Allergies  Allergen Reactions  . Cetirizine & Related Itching and Swelling  . Toradol [Ketorolac Tromethamine] Hives and Itching  .  Flagyl [Metronidazole] Itching and Swelling  . Contrast Media [Iodinated Diagnostic Agents] Itching    Mild itching after IV contrast on 6/1/7 No urticaria visible No wheezing No angioedema  . Nsaids Rash    Family History  Problem Relation Age of Onset  . Hypertension Mother   . Anemia Mother   . Diabetes Father      Prior to Admission medications   Medication Sig Start Date End Date Taking? Authorizing Provider  acetaminophen (TYLENOL) 500 MG tablet Take 1,000 mg by mouth daily as needed for moderate pain.    [provider]  blood  glucose meter kit and supplies KIT Dispense based on patient and insurance preference. Use up to four times daily as directed. (FOR ICD-9 250.00, 250.01). 09/27/18   Charlynne Cousins, MD  clindamycin (CLEOCIN) 2 % vaginal cream Place 1 Applicatorful vaginally at bedtime. Patient not taking: Reported on 05/23/8562 1/49/70   Delora Fuel, MD  dicyclomine (BENTYL) 20 MG tablet Take 1 tablet (20 mg total) by mouth 3 (three) times daily with meals as needed for spasms (abdominal spasms/cramping and/or diarrhea). 01/19/19   Street, Mercedes, PA-C  glipiZIDE (GLUCOTROL) 5 MG tablet Take 1 tablet (5 mg total) by mouth 2 (two) times daily before a meal. Patient taking differently: Take 5 mg by mouth daily.  09/27/18   Charlynne Cousins, MD  insulin detemir (LEVEMIR) 100 unit/ml SOLN Inject 0.2 mLs (20 Units total) into the skin 2 (two) times daily. 01/08/19   Little, Wenda [place], MD  Levonorgestrel (LILETTA) 19.5 MCG/DAY IUD IUD 1 each by Intrauterine route once. 2019    [provider]  metFORMIN (GLUCOPHAGE) 1000 MG tablet Take 1 tablet (1,000 mg total) by mouth 2 (two) times daily with a meal. 09/12/18   Recardo Evangelist, PA-C  nystatin (MYCOSTATIN/NYSTOP) powder Apply to affected area three times daily for 10 days or until improved 01/19/19   Street, [place], [place]    Physical Exam: Vitals:   02/20/19 0030 02/20/19 0045 02/20/19 0100 02/20/19 0130  BP: (!) 128/91 (!) 125/95 120/90 122/88  Pulse: (!) 106 (!) 101 (!) 107 100  Resp: _0 Temp:      TempSrc:      SpO2: 98% 100% 98% 95%  Weight:      Height:        Constitutional: Obese woman resting supine in bed, very somnolent, NAD, calm, appears comfortable Eyes: PERRL, lids and conjunctivae normal ENMT: Mucous membranes are moist. Posterior pharynx clear of any exudate or lesions.Normal dentition.  Neck: normal, supple, no masses. Respiratory: clear to auscultation bilaterally, no wheezing, no crackles. Normal respiratory  effort. No accessory muscle use.  Cardiovascular: Tachycardic with regular rhythm, no murmurs / rubs / gallops. No extremity edema. 2+ pedal pulses. Abdomen: no tenderness, no masses palpated. No hepatosplenomegaly. Bowel sounds positive.  GU: Exam performed with chaperone at bedside.  There is erythema of the right labia with firm induration tender to palpation without fluctuance or active drainage.  Left labia appears normal. Musculoskeletal: no clubbing / cyanosis. No joint deformity upper and lower extremities. Good ROM, no contractures. Normal muscle tone.  Skin: Exam performed with chaperone at bedside.  There is erythema of the right labia with firm induration tender to palpation without fluctuance or active drainage Neurologic: CN 2-12 grossly intact. Sensation intact, DTR normal. Strength 5/5 in all 4.  Psychiatric: Somnolent, intermittently awakens to voice.    Labs on Admission: I have personally reviewed following labs  and imaging studies  CBC: Recent Labs  Lab 02/19/19 2244 02/19/19 2254  WBC 8.2 6.7  NEUTROABS  --  4.9  HGB 14.5 13.9  HCT 45.4 44.1  MCV 86.8 87.2  PLT 333 035   Basic Metabolic Panel: Recent Labs  Lab 02/19/19 2244  NA 129*  K 4.7  CL 96*  CO2 15*  GLUCOSE 499*  BUN 10  CREATININE 0.90  CALCIUM 8.9   GFR: Estimated Creatinine Clearance: 105.4 mL/min (by C-G formula based on SCr of 0.9 mg/dL). Liver Function Tests: Recent Labs  Lab 02/19/19 2244  AST 22  ALT 19  ALKPHOS 85  BILITOT 1.3*  PROT 8.6*  ALBUMIN 3.9   Recent Labs  Lab 02/19/19 2244  LIPASE 23   No results for input(s): AMMONIA in the last 168 hours. Coagulation Profile: No results for input(s): INR, PROTIME in the last 168 hours. Cardiac Enzymes: Recent Labs  Lab 02/19/19 2343  CKTOTAL 67   BNP (last 3 results) No results for input(s): PROBNP in the last 8760 hours. HbA1C: No results for input(s): HGBA1C in the last 72 hours. CBG: Recent Labs  Lab 02/20/19  0114  GLUCAP 303*   Lipid Profile: No results for input(s): CHOL, HDL, LDLCALC, TRIG, CHOLHDL, LDLDIRECT in the last 72 hours. Thyroid Function Tests: No results for input(s): TSH, T4TOTAL, FREET4, T3FREE, THYROIDAB in the last 72 hours. Anemia Panel: No results for input(s): VITAMINB12, FOLATE, FERRITIN, TIBC, IRON, RETICCTPCT in the last 72 hours. Urine analysis:    Component Value Date/Time   COLORURINE STRAW (A) 02/19/2019 2244   APPEARANCEUR CLEAR 02/19/2019 2244   APPEARANCEUR Clear 06/26/2014 0116   LABSPEC 1.031 (H) 02/19/2019 2244   LABSPEC 1.031 06/26/2014 0116   PHURINE 5.0 02/19/2019 2244   GLUCOSEU >=500 (A) 02/19/2019 2244   GLUCOSEU Negative 06/26/2014 0116   HGBUR NEGATIVE 02/19/2019 2244   BILIRUBINUR NEGATIVE 02/19/2019 2244   BILIRUBINUR Negative 06/26/2014 0116   KETONESUR 80 (A) 02/19/2019 2244   PROTEINUR 100 (A) 02/19/2019 2244   UROBILINOGEN 2.0 (H) 11/12/2017 0828   NITRITE NEGATIVE 02/19/2019 2244   LEUKOCYTESUR NEGATIVE 02/19/2019 2244   LEUKOCYTESUR Trace 06/26/2014 0116    Radiological Exams on Admission: No results found.  EKG: Independently reviewed. Sinus tachycardia, nonspecific T wave changes inferior leads.  Rate faster and TWI less pronounced in lead III when compared to prior.  Assessment/Plan Principal Problem:   DKA (diabetic ketoacidoses) (HCC) Active Problems:   Cellulitis of labia   Blood per rectum  Jaquana LOUNETTE SLOAN is a 33 y.o. female with medical history significant for insulin-dependent type 2 diabetes, IBS, chronic pain syndrome, and substance use who is admitted with DKA in the setting of right labial cellulitis/abscess.   Uncontrolled type 2 diabetes with DKA: Likely exacerbated by right labial infection.  She is on Levemir 20 units twice daily, metformin thousand milligrams twice daily, and glipizide 5 mg twice daily as an outpatient. -Continue insulin infusion per DKA protocol -Continue IV NS and transition to  D5-1/2 NS per protocol -Can resume home Levemir once gap closed  Right labial cellulitis/abscess: No obvious fluctuance, open wound, or active discharge on examination.   - narrow antibiotics to IV ceftriaxone  Blood per rectum: Appears to be chronic issue with spotty blood intermittently noted when wiping with tissue paper.  Hemoglobin is stable.  Continue to monitor, defer further work-up pending management of DKA.  Hyponatremia: Pseudohyponatremia in the setting of hyperglycemia.  Corrected sodium is 139.  DVT prophylaxis:  SCDs Code Status: Full code Family Communication: None present on admission Disposition Plan: Likely discharge to home pending further management of DKA and right labial skin infection. Consults called: None Admission status: Observation   Zada Finders MD [facility] Pager [phone]  If 7PM-7AM, please contact night-coverage [email or web address]  02/20/2019, 1:54 AM

## 2019-02-20 NOTE — Progress Notes (Addendum)
Inpatient Diabetes Program Recommendations  AACE/ADA: New Consensus Statement on Inpatient Glycemic Control (2015)  Target Ranges:  Prepandial:   less than 140 mg/dL      Peak postprandial:   less than 180 mg/dL (1-2 hours)      Critically ill patients:  140 - 180 mg/dL   Lab Results  Component Value Date   GLUCAP 225 (H) 02/20/2019   HGBA1C 8.2 (H) 02/20/2019    Review of Glycemic Control  Diabetes history: DM2 Outpatient Diabetes medications: Levemir 20 units qd +Glucotrol 5 bid + Metformin 1 gm bid Current orders for Inpatient glycemic control: Levemir 20 units + Novolog moderate correction tid  Inpatient Diabetes Program Recommendations:    -C peptide to determine if transitioned to type 1 -Novolog 5 units tid meal coverage if eats 50% -Evaluate if insulin needed on discharge home instead of oral DM medications. Patient saw PA Junie Spencer with [facility] on 10-29-18 and CBGs @ home were running 300 ac meals and 500 post meals.Unable to speak to patient by phone due to patient not answering but will followup.  Thank you, Nani Gasser. Deshan Hemmelgarn, RN, MSN, CDE  Diabetes Coordinator Inpatient Glycemic Control Team Team Pager [phone] (8am-5pm) 02/20/2019 12:07 PM

## 2019-02-20 NOTE — ED Notes (Signed)
ED TO INPATIENT HANDOFF REPORT  Name/Age/Gender Jodi Mcgee 33 y.o. female  Code Status    Code Status Orders  (From admission, onward)         Start     Ordered   02/20/19 0053  Full code  Continuous     02/20/19 0053        Code Status History    Date Active Date Inactive Code Status Order ID Comments User Context   09/26/2018 1904 09/27/2018 1633 Full Code [id number]  Charlynne Cousins, MD ED   11/30/2017 1851 12/02/2017 2151 Full Code [id number]  Tresea Mall, CNM Inpatient   11/21/2017 0008 11/23/2017 1717 Full Code [id number]  Osborne Oman, MD Inpatient   11/16/2017 0933 11/17/2017 1222 Full Code [id number]  Aletha Halim, MD Inpatient   11/05/2017 2257 11/08/2017 1708 Full Code [id number]  [name], MD Inpatient      Home/SNF/Other Home  Chief Complaint Abd Pain  Level of Care/Admitting Diagnosis ED Disposition    ED Disposition Condition [facility] Area: [facility] [[id number]]  Level of Care: Stepdown [14]  Admit to SDU based on following criteria: Other see comments  Comments: DKA  Covid Evaluation: N/A  Diagnosis: DKA (diabetic ketoacidoses) [facility]) [[id number]]  Admitting Physician: Lenore Cordia [[id number]]  Attending Physician: Lenore Cordia [[id number]]  PT Class (Do Not Modify): Observation [104]  PT Acc Code (Do Not Modify): Observation [10022]       Medical History Past Medical History:  Diagnosis Date  . Anemia   . Anxiety   . Blood transfusion without reported diagnosis    both CS  . Diabetes mellitus without complication ([place])   . GERD (gastroesophageal reflux disease)    has resolved  . IBS (irritable bowel syndrome)   . Ovarian cyst   . Peritonitis, acute generalized ([place])   . Pulmonary embolism ([place])   . Sepsis ([place])     Allergies Allergies  Allergen Reactions  . Cetirizine & Related Itching and Swelling  . Toradol [Ketorolac Tromethamine] Hives and Itching  . Flagyl [Metronidazole]  Itching and Swelling  . Contrast Media [Iodinated Diagnostic Agents] Itching    Mild itching after IV contrast on 6/1/7 No urticaria visible No wheezing No angioedema  . Nsaids Rash    IV Location/Drains/Wounds Patient Lines/Drains/Airways Status   Active Line/Drains/Airways    Name:   Placement date:   Placement time:   Site:   Days:   Peripheral IV 02/20/19 Left Hand   02/20/19    0014    Hand   less than 1   Peripheral IV 02/20/19 Right Hand   02/20/19    0009    Hand   less than 1          Labs/Imaging Results for orders placed or performed during the hospital encounter of 02/19/19 (from the past 48 hour(s))  Acetaminophen level     Status: Abnormal   Collection Time: 02/19/19 10:41 PM  Result Value Ref Range   Acetaminophen (Tylenol), Serum <10 (L) 10 - 30 ug/mL    Comment: (NOTE) Therapeutic concentrations vary significantly. A range of 10-30 ug/mL  may be an effective concentration for many patients. However, some  are best treated at concentrations outside of this range. Acetaminophen concentrations >150 ug/mL at 4 hours after ingestion  and >50 ug/mL at 12 hours after ingestion are often associated with  toxic reactions. Performed at [facility], [place]  [address] [place], [place] [address]   CBC     Status: Abnormal   Collection Time: 02/19/19 10:44 PM  Result Value Ref Range   WBC 8.2 4.0 - 10.5 K/uL   RBC 5.23 (H) 3.87 - 5.11 MIL/uL   Hemoglobin 14.5 12.0 - 15.0 g/dL   HCT 45.4 36.0 - 46.0 %   MCV 86.8 80.0 - 100.0 fL   MCH 27.7 26.0 - 34.0 pg   MCHC 31.9 30.0 - 36.0 g/dL   RDW 14.0 11.5 - 15.5 %   Platelets 333 150 - 400 K/uL   nRBC 0.0 0.0 - 0.2 %    Comment: Performed at [facility], [place] [address], [place], [place] [address]  Comprehensive metabolic panel     Status: Abnormal   Collection Time: 02/19/19 10:44 PM  Result Value Ref Range   Sodium 129 (L) 135 - 145 mmol/L   Potassium 4.7 3.5 - 5.1 mmol/L    Chloride 96 (L) 98 - 111 mmol/L   CO2 15 (L) 22 - 32 mmol/L   Glucose, Bld 499 (H) 70 - 99 mg/dL   BUN 10 6 - 20 mg/dL   Creatinine, Ser 0.90 0.44 - 1.00 mg/dL   Calcium 8.9 8.9 - 10.3 mg/dL   Total Protein 8.6 (H) 6.5 - 8.1 g/dL   Albumin 3.9 3.5 - 5.0 g/dL   AST 22 15 - 41 U/L   ALT 19 0 - 44 U/L   Alkaline Phosphatase 85 38 - 126 U/L   Total Bilirubin 1.3 (H) 0.3 - 1.2 mg/dL   GFR calc non Af Amer >60 >60 mL/min   GFR calc Af Amer >60 >60 mL/min   Anion gap 18 (H) 5 - 15    Comment: Performed at [facility], [place] [address], [place], [place] [address]  Lipase, blood     Status: None   Collection Time: 02/19/19 10:44 PM  Result Value Ref Range   Lipase 23 11 - 51 U/L    Comment: Performed at [facility], [place] [address], [place], [place] [address]  Urinalysis, Routine w reflex microscopic     Status: Abnormal   Collection Time: 02/19/19 10:44 PM  Result Value Ref Range   Color, Urine STRAW (A) YELLOW   APPearance CLEAR CLEAR   Specific Gravity, Urine 1.031 (H) 1.005 - 1.030   pH 5.0 5.0 - 8.0   Glucose, UA >=500 (A) NEGATIVE mg/dL   Hgb urine dipstick NEGATIVE NEGATIVE   Bilirubin Urine NEGATIVE NEGATIVE   Ketones, ur 80 (A) NEGATIVE mg/dL   Protein, ur 100 (A) NEGATIVE mg/dL   Nitrite NEGATIVE NEGATIVE   Leukocytes,Ua NEGATIVE NEGATIVE   RBC / HPF 0-5 0 - 5 RBC/hpf   WBC, UA 0-5 0 - 5 WBC/hpf   Bacteria, UA NONE SEEN NONE SEEN   Squamous Epithelial / LPF 0-5 0 - 5   Mucus PRESENT     Comment: Performed at [facility], [place] [address], [place], [place] [address]  Urine rapid drug screen (hosp performed)     Status: Abnormal   Collection Time: 02/19/19 10:44 PM  Result Value Ref Range   Opiates NONE DETECTED NONE DETECTED   Cocaine POSITIVE (A) NONE DETECTED   Benzodiazepines NONE DETECTED NONE DETECTED   Amphetamines NONE DETECTED NONE DETECTED   Tetrahydrocannabinol NONE DETECTED NONE DETECTED   Barbiturates  NONE DETECTED NONE DETECTED    Comment: (NOTE) DRUG SCREEN FOR MEDICAL PURPOSES ONLY.  IF  CONFIRMATION IS NEEDED FOR ANY PURPOSE, NOTIFY LAB WITHIN 5 DAYS. LOWEST DETECTABLE LIMITS FOR URINE DRUG SCREEN Drug Class                     Cutoff (ng/mL) Amphetamine and metabolites    1000 Barbiturate and metabolites    200 Benzodiazepine                 782 Tricyclics and metabolites     300 Opiates and metabolites        300 Cocaine and metabolites        300 THC                            50 Performed at [facility], [place] [address] [place], [place] [address]   I-Stat Beta hCG blood, ED ([facility], [facility], [facility] only)     Status: None   Collection Time: 02/19/19 10:54 PM  Result Value Ref Range   I-stat hCG, quantitative <5.0 <5 mIU/mL   Comment 3            Comment:   GEST. AGE      CONC.  (mIU/mL)   <=1 WEEK        5 - 50     2 WEEKS       50 - 500     3 WEEKS       100 - 10,000     4 WEEKS     1,000 - 30,000        FEMALE AND NON-PREGNANT FEMALE:     LESS THAN 5 mIU/mL   CBC with Differential     Status: None   Collection Time: 02/19/19 10:54 PM  Result Value Ref Range   WBC 6.7 4.0 - 10.5 K/uL   RBC 5.06 3.87 - 5.11 MIL/uL   Hemoglobin 13.9 12.0 - 15.0 g/dL   HCT 44.1 36.0 - 46.0 %   MCV 87.2 80.0 - 100.0 fL   MCH 27.5 26.0 - 34.0 pg   MCHC 31.5 30.0 - 36.0 g/dL   RDW 13.9 11.5 - 15.5 %   Platelets 269 150 - 400 K/uL   nRBC 0.0 0.0 - 0.2 %   Neutrophils Relative % 74 %   Neutro Abs 4.9 1.7 - 7.7 K/uL   Lymphocytes Relative 17 %   Lymphs Abs 1.1 0.7 - 4.0 K/uL   Monocytes Relative 9 %   Monocytes Absolute 0.6 0.1 - 1.0 K/uL   Eosinophils Relative 0 %   Eosinophils Absolute 0.0 0.0 - 0.5 K/uL   Basophils Relative 0 %   Basophils Absolute 0.0 0.0 - 0.1 K/uL   Immature Granulocytes 0 %   Abs Immature Granulocytes 0.02 0.00 - 0.07 K/uL    Comment: Performed at [facility], [place] [address] [place], [place] [address]  Lactic acid, plasma      Status: None   Collection Time: 02/19/19 10:54 PM  Result Value Ref Range   Lactic Acid, Venous 1.2 0.5 - 1.9 mmol/L    Comment: Performed at [facility], [place] [address] [place], [place] [address]  CK     Status: None   Collection Time: 02/19/19 11:43 PM  Result Value Ref Range   Total CK 67 38 - 234 U/L    Comment: Performed at [facility], [place] [address] [place], [place] [address]  POC occult blood, ED  Provider will collect     Status: Abnormal   Collection Time: 02/19/19 11:47 PM  Result Value Ref Range   Fecal Occult Bld POSITIVE (A) NEGATIVE  Blood gas, arterial (at [facility])     Status: Abnormal   Collection Time: 02/19/19 11:54 PM  Result Value Ref Range   FIO2 21.00    pH, Arterial 7.354 7.350 - 7.450   pCO2 arterial 30.6 (L) 32.0 - 48.0 mmHg   pO2, Arterial 96.7 83.0 - 108.0 mmHg   Bicarbonate 16.6 (L) 20.0 - 28.0 mmol/L   Acid-base deficit 7.4 (H) 0.0 - 2.0 mmol/L   O2 Saturation 97.3 %   Patient temperature 98.3    Collection site RIGHT RADIAL    Drawn by [id number]    Sample type ARTERIAL DRAW    Allens test (pass/fail) PASS PASS    Comment: Performed at [facility], [place] [address] [place], [place] [address]  CBG monitoring, ED     Status: Abnormal   Collection Time: 02/20/19  1:14 AM  Result Value Ref Range   Glucose-Capillary 303 (H) 70 - 99 mg/dL  CBG monitoring, ED     Status: Abnormal   Collection Time: 02/20/19  2:13 AM  Result Value Ref Range   Glucose-Capillary 279 (H) 70 - 99 mg/dL  CBG monitoring, ED     Status: Abnormal   Collection Time: 02/20/19  3:14 AM  Result Value Ref Range   Glucose-Capillary 320 (H) 70 - 99 mg/dL  CBG monitoring, ED     Status: Abnormal   Collection Time: 02/20/19  4:11 AM  Result Value Ref Range   Glucose-Capillary 241 (H) 70 - 99 mg/dL   Comment 1 Notify RN    No results found.  Pending Labs Unresulted Labs (From admission, onward)    Start     Ordered    02/20/19 0057  Hemoglobin A1c  Add-on,   R     02/20/19 0058   02/20/19 1914  Basic metabolic panel  STAT Now then every 4 hours ,   STAT     02/20/19 0058   02/19/19 2343  Blood culture (routine x 2)  BLOOD CULTURE X 2,   STAT     02/19/19 2351          Vitals/Pain Today's Vitals   02/20/19 0416 02/20/19 0426 02/20/19 0430 02/20/19 0445  BP: 112/71  99/70 95/67  Pulse: 97  (!) 106 (!) 103  Resp: 14  15 16   Temp:      TempSrc:      SpO2: 99%  97% 97%  Weight:      Height:      PainSc:  Asleep      Isolation Precautions No active isolations  Medications Medications  insulin regular, human (MYXREDLIN) 100 units/ 100 mL infusion (5.4 Units/hr Intravenous Rate/Dose Change 02/20/19 0423)  sodium chloride flush (NS) 0.9 % injection 3 mL (3 mLs Intravenous Given 02/20/19 0140)  traMADol (ULTRAM) tablet 50 mg (has no administration in time range)  ondansetron (ZOFRAN) tablet 4 mg (has no administration in time range)    Or  ondansetron (ZOFRAN) injection 4 mg (has no administration in time range)  cefTRIAXone (ROCEPHIN) 1 g in sodium chloride 0.9 % 100 mL IVPB (0 g Intravenous Stopped 02/20/19 0206)  0.9 %  sodium chloride infusion ( Intravenous Stopped 02/20/19 0417)  dextrose 5 %-0.45 % sodium chloride infusion ( Intravenous New Bag/Given 02/20/19 0420)  sodium chloride 0.9 % bolus  1,000 mL (0 mLs Intravenous Stopped 02/20/19 0145)  famotidine (PEPCID) IVPB 20 mg premix (0 mg Intravenous Stopped 02/20/19 0111)  haloperidol lactate (HALDOL) injection 5 mg (5 mg Intravenous Given 02/19/19 2349)    Mobility walks

## 2019-02-20 NOTE — Progress Notes (Signed)
A consult was received from an ED physician for zosyn and vancomycin per pharmacy dosing.  The patient's profile has been reviewed for ht/wt/allergies/indication/available labs.   A one time order has been placed for Zosyn 3.375 Gm and Vancomycin 2 Gm.  Further antibiotics/pharmacy consults should be ordered by admitting physician if indicated.                       Thank you, Dorrene German 02/20/2019  12:08 AM

## 2019-02-20 NOTE — ED Notes (Signed)
Pt pulled out left hand peripheral IV.

## 2019-02-20 NOTE — ED Notes (Signed)
Pt. CBG 241. RN,Kaitlyn, W. Made aware.

## 2019-02-21 DIAGNOSIS — N751 Abscess of Bartholin's gland: Secondary | ICD-10-CM | POA: Diagnosis present

## 2019-02-21 DIAGNOSIS — E1110 Type 2 diabetes mellitus with ketoacidosis without coma: Secondary | ICD-10-CM | POA: Diagnosis not present

## 2019-02-21 DIAGNOSIS — E871 Hypo-osmolality and hyponatremia: Secondary | ICD-10-CM | POA: Diagnosis not present

## 2019-02-21 LAB — C-PEPTIDE: C-Peptide: 1 ng/mL — ABNORMAL LOW (ref 1.1–4.4)

## 2019-02-21 LAB — HIV ANTIBODY (ROUTINE TESTING W REFLEX): HIV Screen 4th Generation wRfx: NONREACTIVE

## 2019-02-21 LAB — GLUCOSE, CAPILLARY
Glucose-Capillary: 485 mg/dL — ABNORMAL HIGH (ref 70–99)
Glucose-Capillary: 360 mg/dL — ABNORMAL HIGH (ref 70–99)
Glucose-Capillary: 250 mg/dL — ABNORMAL HIGH (ref 70–99)

## 2019-02-21 LAB — BASIC METABOLIC PANEL
Anion gap: 11 (ref 5–15)
BUN: 8 mg/dL (ref 6–20)
CO2: 23 mmol/L (ref 22–32)
Calcium: 8.5 mg/dL — ABNORMAL LOW (ref 8.9–10.3)
Chloride: 95 mmol/L — ABNORMAL LOW (ref 98–111)
Creatinine, Ser: 0.79 mg/dL (ref 0.44–1.00)
GFR calc Af Amer: >60 mL/min (ref >60)
GFR calc non Af Amer: >60 mL/min (ref >60)
Glucose, Bld: 543 mg/dL (ref 70–99)
Potassium: 3.5 mmol/L (ref 3.5–5.1)
Sodium: 129 mmol/L — ABNORMAL LOW (ref 135–145)

## 2019-02-21 LAB — GC/CHLAMYDIA PROBE AMP (~~LOC~~) NOT AT ARMC
Chlamydia: NEGATIVE
Neisseria Gonorrhea: NEGATIVE

## 2019-02-21 MED ORDER — CEPHALEXIN 500 MG PO CAPS
500.0000 mg | ORAL_CAPSULE | Freq: Two times a day (BID) | ORAL | Status: DC
  Administered 2019-02-21: 500 mg via ORAL
  Filled 2019-02-21: qty 1

## 2019-02-21 MED ORDER — INSULIN GLARGINE 100 UNIT/ML ~~LOC~~ SOLN: 10.0000 [IU] | Freq: Once | SUBCUTANEOUS | Status: DC

## 2019-02-21 MED ORDER — MORPHINE SULFATE (PF) 2 MG/ML IV SOLN
0.5000 mg | Freq: Once | INTRAVENOUS | Status: AC
  Administered 2019-02-21: 10:00:00 0.5 mg via INTRAVENOUS
  Filled 2019-02-21: qty 1

## 2019-02-21 MED ORDER — BLOOD GLUCOSE MONITOR KIT: PACK | 1 refills | Status: AC

## 2019-02-21 MED ORDER — CEPHALEXIN 500 MG PO CAPS: 500.0000 mg | ORAL_CAPSULE | Freq: Two times a day (BID) | ORAL | 0 refills | Status: DC

## 2019-02-21 MED ORDER — INSULIN DETEMIR 100 UNIT/ML ~~LOC~~ SOLN
20.0000 [IU] | Freq: Once | SUBCUTANEOUS | Status: AC
  Administered 2019-02-21: 20 [IU] via SUBCUTANEOUS
  Filled 2019-02-21: qty 0.2

## 2019-02-21 NOTE — TOC Transition Note (Signed)
Transition of Care [facility] - CM/SW Discharge Note   Patient Details  Name: Jodi Mcgee MRN: [id number] Date of Birth: 1986-01-27  Transition of Care [facility]) CM/SW Contact:  Leeroy Cha, RN Phone Number: 02/21/2019, 12:24 PM   Clinical Narrative:    Discharged to home with self-care, orders checked for hhc needs. No TOC needs present at time of discharge.  Patient is able to arrangement own appointments and home care.   Final next level of care: Home/Self Care Barriers to Discharge: No Barriers Identified   Patient Goals and CMS Choice Patient states their goals for this hospitalization and ongoing recovery are:: none CMS Medicare.gov Compare Post Acute Care list provided to:: Patient    Discharge Placement                       Discharge Plan and Services   Discharge Planning Services: CM Consult                                 Social Determinants of Health (SDOH) Interventions     Readmission Risk Interventions No flowsheet data found.

## 2019-02-21 NOTE — Discharge Instructions (Signed)
Call Gynecology office at [phone] to set up appointment 2 weeks after discharge for follow up for your abscess  Apply warm compresses to right side of groin fifteen minutes every hour or atelast wice aday  Sitz bath  Keflex to continue for 10 days

## 2019-02-21 NOTE — Progress Notes (Signed)
CBG this morning 485. Notified MD x2. Awaiting call back and orders. Will continue to monitor.

## 2019-02-21 NOTE — Progress Notes (Signed)
Jodi Mcgee to be D/C'd Home per MD order.  Discussed prescriptions and follow up appointments with the patient. Prescriptions given to patient, medication list explained in detail. Pt verbalized understanding.  Allergies as of 02/21/2019      Reactions   Cetirizine & Related Itching, Swelling   Toradol [ketorolac Tromethamine] Hives, Itching   Flagyl [metronidazole] Itching, Swelling   Contrast Media [iodinated Diagnostic Agents] Itching   Mild itching after IV contrast on 6/1/7 No urticaria visible No wheezing No angioedema   Nsaids Rash      Medication List    STOP taking these medications   clindamycin 2 % vaginal cream Commonly known as:  CLEOCIN     TAKE these medications   acetaminophen 500 MG tablet Commonly known as:  TYLENOL Take 1,000 mg by mouth daily as needed for moderate pain.   blood glucose meter kit and supplies Kit Dispense based on patient and insurance preference. Use up to four times daily as directed. (FOR ICD-9 250.00, 250.01).   busPIRone 10 MG tablet Commonly known as:  BUSPAR Take 10 mg by mouth 2 (two) times daily.   cephALEXin 500 MG capsule Commonly known as:  KEFLEX Take 1 capsule (500 mg total) by mouth every 12 (twelve) hours.   dicyclomine 20 MG tablet Commonly known as:  BENTYL Take 1 tablet (20 mg total) by mouth 3 (three) times daily with meals as needed for spasms (abdominal spasms/cramping and/or diarrhea).   glipiZIDE 5 MG tablet Commonly known as:  GLUCOTROL Take 1 tablet (5 mg total) by mouth 2 (two) times daily before a meal. What changed:  when to take this   hydrOXYzine 25 MG tablet Commonly known as:  ATARAX/VISTARIL Take 25 mg by mouth every 6 (six) hours as needed for anxiety.   insulin detemir 100 unit/ml Soln Commonly known as:  LEVEMIR Inject 0.2 mLs (20 Units total) into the skin 2 (two) times daily.   Latuda 60 MG Tabs Generic drug:  Lurasidone HCl Take 60 mg by mouth daily.   Levonorgestrel 19.5  MCG/DAY Iud IUD Commonly known as:  LILETTA 1 each by Intrauterine route once. 2019   metFORMIN 1000 MG tablet Commonly known as:  GLUCOPHAGE Take 1 tablet (1,000 mg total) by mouth 2 (two) times daily with a meal.   nystatin powder Commonly known as:  MYCOSTATIN/NYSTOP Apply to affected area three times daily for 10 days or until improved       Vitals:   02/20/19 2027 02/21/19 0514  BP: 101/61 114/83  Pulse: 82 (!) 110  Resp: 16 16  Temp: 98.2 F (36.8 C) 98.3 F (36.8 C)  SpO2: 100% 100%    Skin clean, dry and intact without evidence of skin break down, no evidence of skin tears noted. IV catheter discontinued intact. Site without signs and symptoms of complications. Dressing and pressure applied. Pt denies pain at this time. No complaints noted.  An After Visit Summary was printed and given to the patient. Patient escorted via [place], and D/C home via private auto.  Nonie Hoyer S 02/21/2019 12:20 PM

## 2019-02-21 NOTE — Discharge Summary (Signed)
Discharge Summary  Jodi Mcgee GBE:[id number] DOB: 1985-11-05  PCP: Wilber Oliphant, MD  Admit date: 02/19/2019 Discharge date: 02/21/2019   Time spent: < 25 minutes  Admitted From: home Disposition:  home  Recommendations for Outpatient Follow-up:  1. Follow up with PCP in 1 week to monitor blood glucose, no changes made to home insulin regimen.  Patient encouraged to follow-up with ambulatory referral to endocrinology 2. Patient to call women's health clinic at [phone] in 2 weeks for follow-up of Bartholin's abscess 3. Continue Keflex for 10 days, sitz bath, warm compresses every 15 minutes per hour or twice a day    Discharge Diagnoses:  Active Hospital Problems   Diagnosis Date Noted  . DKA (diabetic ketoacidoses) ([place]) 02/20/2019  . Cellulitis of labia 02/20/2019  . Blood per rectum 02/20/2019    Resolved Hospital Problems  No resolved problems to display.    Discharge Condition: Stable  CODE STATUS: Full code  History of present illness:  Jodi Mcgee is a 33 y.o. year old female with medical history significant for type 2 diabetes, IBS, chronic pain syndrome, substance abuse who presented on 02/19/2019 with 2 days of generalized abdominal pain with no nausea or vomiting for which she has been taken Tylenol every 2 hours as well as noticing a "knot" near her right groin for the past week and was admitted with working diagnosis of DKA presumed secondary to cellulitis of right labia.  Remaining hospital course addressed in problem based format below:   Hospital Course:   DKA in poorly controlled type II diabetic (A1c of 11 on 09/2018).  Patient admitted to stepdown unit for DKA protocol.  Anion gap of 18 and admission glucose of 189, anion gap closed with initiation of insulin, IV fluids, supportive care.  Suspect flare like related to right labia abscess mentioned more below.  Patient was slowly started back on her home regimen of Lantus at reduced dose of  20 units daily but still having quite elevated glucose so was increased back to her home regimen with appropriate response.  Patient encouraged to follow-up with outpatient family medicine provider and follow-up with ambulatory referral to endocrinology (C-peptide of 1 on 4/30).  Patient will continue previous oral diabetic regimen as well on discharge.  Pseudohyponatremia secondary to hyperglycemia, corrected showed normal sodium. .   Bartholin abscess of right labia.  Initially presumed to be nonpurulent cellulitis, empirically treated with IV vancomycin and Zosyn in the ED.  That was changed to IV ceftriaxone given patient had no signs or symptoms of sepsis, remained afebrile, blood cultures were obtained and no concern for MRSA.  On reexamination still to be consistent with abscess which spontaneously ruptured.  Discussed case with on-call gynecologist Dr. Elly Modena who recommended supportive care with warm compresses (15 minutes every 1 hour), Keflex for 10-day course and sitz bath given abscess had spontaneously started draining here.  Patient will follow-up with women's health clinic in 2 weeks.   Acute on chronic abdominal pain likely related to chronic IBS syndrome.   Other etiologies for abdominal pain have been unremarkable with normal lipase, no infection on UA, hCG unremarkable, acetaminophen level less than 10.  Hypokalemia, resolved.  Likely related to insulin infusion.  Added additive potassium supplementation to IV fluids, closely monitor, may need oral supplementation as well.  Muscle spasms.   Likely related to hypokalemia.  We will continue to supplement.  PRN Flexeril  History of substance abuse.   UDS positive for cocaine.  Consult  Education officer, museum    Consultations:  Phone curbside (Gynecology)  Procedures/Studies: none  Discharge Exam: BP 114/83 (BP Location: Right Arm)   Pulse (!) 110   Temp 98.3 F (36.8 C)   Resp 16   Ht '5\' 7"'  (1.702 m)   Wt 93.2 kg    SpO2 100%   BMI 32.19 kg/m   General: Lying in bed, no apparent distress Eyes: EOMI, anicteric ENT: Oral Mucosa clear and moist Cardiovascular: regular rate and rhythm, no murmurs, rubs or gallops, no edema, Respiratory: Normal respiratory effort on room air, lungs clear to auscultation bilaterally Abdomen: soft, non-distended, non-tender, normal bowel sounds Skin: No Rash GU: Right labia swollen, erythematous, abscess present with slight opening and mixed purulent drainage tender to palpation Neurologic: Grossly no focal neuro deficit.Mental status AAOx3, speech normal, Psychiatric:Appropriate affect, and mood   Discharge Instructions You were cared for by a hospitalist during your hospital stay. If you have any questions about your discharge medications or the care you received while you were in the hospital after you are discharged, you can call the unit and asked to speak with the hospitalist on call if the hospitalist that took care of you is not available. Once you are discharged, your primary care physician will handle any further medical issues. Please note that NO REFILLS for any discharge medications will be authorized once you are discharged, as it is imperative that you return to your primary care physician (or establish a relationship with a primary care physician if you do not have one) for your aftercare needs so that they can reassess your need for medications and monitor your lab values.  Discharge Instructions    Diet - low sodium heart healthy   Complete by:  As directed    Increase activity slowly   Complete by:  As directed      Allergies as of 02/21/2019      Reactions   Cetirizine & Related Itching, Swelling   Toradol [ketorolac Tromethamine] Hives, Itching   Flagyl [metronidazole] Itching, Swelling   Contrast Media [iodinated Diagnostic Agents] Itching   Mild itching after IV contrast on 6/1/7 No urticaria visible No wheezing No angioedema   Nsaids Rash       Medication List    STOP taking these medications   clindamycin 2 % vaginal cream Commonly known as:  CLEOCIN     TAKE these medications   acetaminophen 500 MG tablet Commonly known as:  TYLENOL Take 1,000 mg by mouth daily as needed for moderate pain.   blood glucose meter kit and supplies Kit Dispense based on patient and insurance preference. Use up to four times daily as directed. (FOR ICD-9 250.00, 250.01).   busPIRone 10 MG tablet Commonly known as:  BUSPAR Take 10 mg by mouth 2 (two) times daily.   cephALEXin 500 MG capsule Commonly known as:  KEFLEX Take 1 capsule (500 mg total) by mouth every 12 (twelve) hours.   dicyclomine 20 MG tablet Commonly known as:  BENTYL Take 1 tablet (20 mg total) by mouth 3 (three) times daily with meals as needed for spasms (abdominal spasms/cramping and/or diarrhea).   glipiZIDE 5 MG tablet Commonly known as:  GLUCOTROL Take 1 tablet (5 mg total) by mouth 2 (two) times daily before a meal. What changed:  when to take this   hydrOXYzine 25 MG tablet Commonly known as:  ATARAX/VISTARIL Take 25 mg by mouth every 6 (six) hours as needed for anxiety.   insulin detemir 100 unit/ml  Soln Commonly known as:  LEVEMIR Inject 0.2 mLs (20 Units total) into the skin 2 (two) times daily.   Latuda 60 MG Tabs Generic drug:  Lurasidone HCl Take 60 mg by mouth daily.   Levonorgestrel 19.5 MCG/DAY Iud IUD Commonly known as:  LILETTA 1 each by Intrauterine route once. 2019   metFORMIN 1000 MG tablet Commonly known as:  GLUCOPHAGE Take 1 tablet (1,000 mg total) by mouth 2 (two) times daily with a meal.   nystatin powder Commonly known as:  MYCOSTATIN/NYSTOP Apply to affected area three times daily for 10 days or until improved      Allergies  Allergen Reactions  . Cetirizine & Related Itching and Swelling  . Toradol [Ketorolac Tromethamine] Hives and Itching  . Flagyl [Metronidazole] Itching and Swelling  . Contrast Media [Iodinated  Diagnostic Agents] Itching    Mild itching after IV contrast on 6/1/7 No urticaria visible No wheezing No angioedema  . Nsaids Rash   Follow-up Information    [place]. Call in 2 week(s).   Why:  Call to make appointment in 2 weeks after discharge Contact information: [place] [phone]           The results of significant diagnostics from this hospitalization (including imaging, microbiology, ancillary and laboratory) are listed below for reference.    Significant Diagnostic Studies: No results found.  Microbiology: Recent Results (from the past 240 hour(s))  MRSA PCR Screening     Status: None   Collection Time: 02/20/19  5:23 AM  Result Value Ref Range Status   MRSA by PCR NEGATIVE NEGATIVE Final    Comment:        The GeneXpert MRSA Assay (FDA approved for NASAL specimens only), is one component of a comprehensive MRSA colonization surveillance program. It is not intended to diagnose MRSA infection nor to guide or monitor treatment for MRSA infections. Performed at [facility], [place] [address], [place], [place] [address]      Labs: Basic Metabolic Panel: Recent Labs  Lab 02/19/19 2244 02/20/19 0456 02/20/19 0733 02/21/19 0911  NA 129* 136 135 129*  K 4.7 3.5 3.2* 3.5  CL 96* 105 105 95*  CO2 15* 22 21* 23  GLUCOSE 499* 195* 228* 543*  BUN '10 8 8 8  ' CREATININE 0.90 0.61 0.63 0.79  CALCIUM 8.9 8.7* 8.7* 8.5*   Liver Function Tests: Recent Labs  Lab 02/19/19 2244  AST 22  ALT 19  ALKPHOS 85  BILITOT 1.3*  PROT 8.6*  ALBUMIN 3.9   Recent Labs  Lab 02/19/19 2244  LIPASE 23   No results for input(s): AMMONIA in the last 168 hours. CBC: Recent Labs  Lab 02/19/19 2244 02/19/19 2254  WBC 8.2 6.7  NEUTROABS  --  4.9  HGB 14.5 13.9  HCT 45.4 44.1  MCV 86.8 87.2  PLT 333 269   Cardiac Enzymes: Recent Labs  Lab 02/19/19 2343  CKTOTAL 67   BNP: BNP  (last 3 results) No results for input(s): BNP in the last 8760 hours.  ProBNP (last 3 results) No results for input(s): PROBNP in the last 8760 hours.  CBG: Recent Labs  Lab 02/20/19 1219 02/20/19 1721 02/20/19 2028 02/21/19 0740 02/21/19 1050  GLUCAP 153* 308* 404* 485* 360*       Signed:  Desiree Hane, MD [facility] 02/21/2019, 11:10 AM

## 2019-02-21 NOTE — Progress Notes (Signed)
This shift assessed pt's needs regarding Diabetes mgmnt. Ordered living well witth diabetes booklet from pharmacy. Educated on video series on TV while admitted, also reviewed  Healthy meals, foot care and need for CBG checks. Will continue to monitor

## 2019-02-23 ENCOUNTER — Emergency Department (HOSPITAL_COMMUNITY)
Admission: EM | Admit: 2019-02-23 | Discharge: 2019-02-23 | Disposition: A | Discharge status: Home / Self Care | Payer: Medicaid Other | Attending: Emergency Medicine | Admitting: Emergency Medicine

## 2019-02-23 ENCOUNTER — Emergency Department (HOSPITAL_COMMUNITY): Payer: Medicaid Other

## 2019-02-23 ENCOUNTER — Other Ambulatory Visit: Payer: Self-pay

## 2019-02-23 ENCOUNTER — Encounter (HOSPITAL_COMMUNITY): Payer: Self-pay | Admitting: Emergency Medicine

## 2019-02-23 DIAGNOSIS — E876 Hypokalemia: Secondary | ICD-10-CM | POA: Insufficient documentation

## 2019-02-23 DIAGNOSIS — R739 Hyperglycemia, unspecified: Secondary | ICD-10-CM

## 2019-02-23 DIAGNOSIS — E1165 Type 2 diabetes mellitus with hyperglycemia: Secondary | ICD-10-CM | POA: Diagnosis not present

## 2019-02-23 DIAGNOSIS — F17210 Nicotine dependence, cigarettes, uncomplicated: Secondary | ICD-10-CM | POA: Diagnosis not present

## 2019-02-23 DIAGNOSIS — Z794 Long term (current) use of insulin: Secondary | ICD-10-CM | POA: Diagnosis not present

## 2019-02-23 DIAGNOSIS — N83291 Other ovarian cyst, right side: Secondary | ICD-10-CM | POA: Diagnosis not present

## 2019-02-23 DIAGNOSIS — R102 Pelvic and perineal pain: Secondary | ICD-10-CM | POA: Diagnosis present

## 2019-02-23 DIAGNOSIS — N751 Abscess of Bartholin's gland: Secondary | ICD-10-CM | POA: Insufficient documentation

## 2019-02-23 DIAGNOSIS — N83299 Other ovarian cyst, unspecified side: Secondary | ICD-10-CM

## 2019-02-23 LAB — COMPREHENSIVE METABOLIC PANEL
ALT: 15 U/L (ref 0–44)
AST: 15 U/L (ref 15–41)
Albumin: 3.2 g/dL — ABNORMAL LOW (ref 3.5–5.0)
Alkaline Phosphatase: 86 U/L (ref 38–126)
Anion gap: 13 (ref 5–15)
BUN: 10 mg/dL (ref 6–20)
CO2: 25 mmol/L (ref 22–32)
Calcium: 8.2 mg/dL — ABNORMAL LOW (ref 8.9–10.3)
Chloride: 91 mmol/L — ABNORMAL LOW (ref 98–111)
Creatinine, Ser: 0.72 mg/dL (ref 0.44–1.00)
GFR calc Af Amer: >60 mL/min (ref >60)
GFR calc non Af Amer: >60 mL/min (ref >60)
Glucose, Bld: 524 mg/dL (ref 70–99)
Potassium: 3.2 mmol/L — ABNORMAL LOW (ref 3.5–5.1)
Sodium: 129 mmol/L — ABNORMAL LOW (ref 135–145)
Total Bilirubin: 0.7 mg/dL (ref 0.3–1.2)
Total Protein: 7.3 g/dL (ref 6.5–8.1)

## 2019-02-23 LAB — CBC WITH DIFFERENTIAL/PLATELET
Abs Immature Granulocytes: 0.02 10*3/uL (ref 0.00–0.07)
Basophils Absolute: 0 10*3/uL (ref 0.0–0.1)
Basophils Relative: 0 %
Eosinophils Absolute: 0 10*3/uL (ref 0.0–0.5)
Eosinophils Relative: 0 %
HCT: 38.3 % (ref 36.0–46.0)
Hemoglobin: 12.3 g/dL (ref 12.0–15.0)
Immature Granulocytes: 0 %
Lymphocytes Relative: 22 %
Lymphs Abs: 1.1 10*3/uL (ref 0.7–4.0)
MCH: 28 pg (ref 26.0–34.0)
MCHC: 32.1 g/dL (ref 30.0–36.0)
MCV: 87 fL (ref 80.0–100.0)
Monocytes Absolute: 0.3 10*3/uL (ref 0.1–1.0)
Monocytes Relative: 6 %
Neutro Abs: 3.7 10*3/uL (ref 1.7–7.7)
Neutrophils Relative %: 72 %
Platelets: 268 10*3/uL (ref 150–400)
RBC: 4.4 MIL/uL (ref 3.87–5.11)
RDW: 13.9 % (ref 11.5–15.5)
WBC: 5.2 10*3/uL (ref 4.0–10.5)
nRBC: 0 % (ref 0.0–0.2)

## 2019-02-23 LAB — I-STAT BETA HCG BLOOD, ED (MC, WL, AP ONLY): I-stat hCG, quantitative: <5 m[IU]/mL (ref <5)

## 2019-02-23 LAB — CBG MONITORING, ED
Glucose-Capillary: 552 mg/dL (ref 70–99)
Glucose-Capillary: 357 mg/dL — ABNORMAL HIGH (ref 70–99)

## 2019-02-23 LAB — LIPASE, BLOOD: Lipase: 31 U/L (ref 11–51)

## 2019-02-23 MED ORDER — ONDANSETRON HCL 4 MG/2ML IJ SOLN
4.0000 mg | Freq: Once | INTRAMUSCULAR | Status: AC
  Administered 2019-02-23: 4 mg via INTRAVENOUS
  Filled 2019-02-23: qty 2

## 2019-02-23 MED ORDER — OXYCODONE-ACETAMINOPHEN 5-325 MG PO TABS: 1.0000 | ORAL_TABLET | ORAL | 0 refills | Status: DC | PRN

## 2019-02-23 MED ORDER — POTASSIUM CHLORIDE CRYS ER 20 MEQ PO TBCR: 20.0000 meq | EXTENDED_RELEASE_TABLET | Freq: Two times a day (BID) | ORAL | 0 refills | Status: DC

## 2019-02-23 MED ORDER — SODIUM CHLORIDE 0.9 % IV BOLUS
1000.0000 mL | Freq: Once | INTRAVENOUS | Status: AC
  Administered 2019-02-23: 1000 mL via INTRAVENOUS

## 2019-02-23 MED ORDER — HYDROMORPHONE HCL 1 MG/ML IJ SOLN
1.0000 mg | Freq: Once | INTRAMUSCULAR | Status: AC
  Administered 2019-02-23: 1 mg via INTRAVENOUS
  Filled 2019-02-23: qty 1

## 2019-02-23 MED ORDER — DIPHENHYDRAMINE HCL 50 MG/ML IJ SOLN
12.5000 mg | Freq: Once | INTRAMUSCULAR | Status: AC
  Administered 2019-02-23: 12.5 mg via INTRAVENOUS
  Filled 2019-02-23: qty 1

## 2019-02-23 MED ORDER — ONDANSETRON 4 MG PO TBDP: 4.0000 mg | ORAL_TABLET | Freq: Three times a day (TID) | ORAL | 0 refills | Status: DC | PRN

## 2019-02-23 NOTE — ED Triage Notes (Signed)
Patient from home, called EMS for abdominal pain. Pt seen dc'd Friday for DKA and IBS. Now having lower rt abd pain radiating to groin. Pt reports having an abscess in groin area that has been draining blood today. Pt CBG 534. Pt has had difficulty getting DM under control, medications have been changed multiple times.

## 2019-02-23 NOTE — ED Notes (Signed)
Bed: EX93 Expected date:  Expected time:  Means of arrival:  Comments: 14F abdominal pain

## 2019-02-23 NOTE — ED Notes (Signed)
Patient transported to CT 

## 2019-02-23 NOTE — ED Provider Notes (Signed)
[facility] DEPT Provider Note   CSN: [id number] Arrival date & time: 02/23/19  0142    History   Chief Complaint Chief Complaint  Patient presents with   Abdominal Pain   Hyperglycemia    HPI Jodi Mcgee is a 33 y.o. female with a history of diabetes mellitus Type II, chronic pain syndrome, hypertension, PE, history of cocaine abuse, ovarian cyst, and pelvic adhesion disease who presents to the emergency department by EMS with a chief complaint of right-sided pelvic pain.  Patient reports that she woke up from a nap this afternoon with sudden onset right-sided pelvic pain.  She reports the pain was constant and severe.  She reports the constant pain is dull and aching with intermittent sharp, stabbing pain that shoots up the right side of her groin and flank.  She reports associated nausea,   She reports that she has an abscess to her right labia for which she is taking Keflex.  She reports she is having some pain in this region, but the pain is very different and has been ongoing for several days.  She reports that this pain has been gradually improving.  She also reports that the abscess has been draining for the last few days and purulent drainage significantly increased today.  She denies fever, chills, vomiting, diarrhea, constipation, vaginal pain, bleeding, or discharge, back pain, chest pain, shortness of breath, headache, dizziness, lightheadedness.  She reports that she has been compliant with her home medication since discharge.  She does not think she is pregnant as she has an IUD in place.  She is unsure if she has an OB/GYN and cannot recall the name of the individual that placed her IUD.      The history is provided by the patient. No language interpreter was used.    Past Medical History:  Diagnosis Date   Anemia    Anxiety    Blood transfusion without reported diagnosis    both CS   Diabetes mellitus without  complication (HCC)    GERD (gastroesophageal reflux disease)    has resolved   IBS (irritable bowel syndrome)    Ovarian cyst    Peritonitis, acute generalized ([place])    Pulmonary embolism ([place])    Sepsis ([place])     Patient Active Problem List   Diagnosis Date Noted   Bartholin's gland abscess 02/21/2019   DKA (diabetic ketoacidoses) ([place]) 02/20/2019   Cellulitis of labia 02/20/2019   Blood per rectum 02/20/2019   Uncontrolled type 2 DM with hyperosmolar nonketotic hyperglycemia ([place]) 09/26/2018   Hyponatremia 09/26/2018   Uncontrolled type 2 diabetes mellitus with hyperosmolar nonketotic hyperglycemia ([place]) 09/26/2018   History of cesarean section, classical 01/03/2018   Chronic hypertension 01/03/2018   IUD contraception 01/03/2018   Postpartum care and examination 01/03/2018   Pulmonary embolism during puerperium 12/04/2017   Pulmonary edema 11/30/2017   S/P cesarean section 11/21/2017   Pelvic adhesive disease 10/22/2017   Nasal septal perforation 10/17/2017   History of cocaine abuse ([place]) 10/17/2017   Epistaxis 10/17/2017   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   Obesity (BMI 30-39.9) 09/25/2017   Hypertension in pregnancy, preeclampsia, severe, delivered/postpartum 09/13/2017   Previous cesarean section complicating pregnancy 72/06/4708   Type 2 diabetes mellitus, without long-term current use of insulin ([place]) 08/23/2017   Chronic pain syndrome 08/23/2017    Past Surgical History:  Procedure Laterality Date   APPENDECTOMY     ruptured    BUNIONECTOMY  CESAREAN SECTION     CESAREAN SECTION Bilateral 11/20/2017   Procedure: CESAREAN SECTION;  Surgeon: Sloan Leiter, MD;  Location: [facility];  Service: Obstetrics;  Laterality: Bilateral;   OVARIAN CYST DRAINAGE     SMALL BOWEL REPAIR       OB History    Gravida  3   Para  3   Term  1   Preterm  2   AB  0   Living  3     SAB  0   TAB  0   Ectopic   0   Multiple  0   Live Births  3            Home Medications    Prior to Admission medications   Medication Sig Start Date End Date Taking? Authorizing Provider  acetaminophen (TYLENOL) 500 MG tablet Take 1,000 mg by mouth every 8 (eight) hours as needed for moderate pain.    Yes [provider]  cephALEXin (KEFLEX) 500 MG capsule Take 1 capsule (500 mg total) by mouth every 12 (twelve) hours. 02/21/19  Yes Oretha Milch D, MD  glipiZIDE (GLUCOTROL) 5 MG tablet Take 1 tablet (5 mg total) by mouth 2 (two) times daily before a meal. Patient taking differently: Take 5 mg by mouth daily.  09/27/18  Yes Charlynne Cousins, MD  insulin detemir (LEVEMIR) 100 unit/ml SOLN Inject 0.2 mLs (20 Units total) into the skin 2 (two) times daily. Patient taking differently: Inject 20 Units into the skin 3 (three) times daily.  01/08/19  Yes Little, Wenda Overland, MD  Levonorgestrel (LILETTA) 19.5 MCG/DAY IUD IUD 1 each by Intrauterine route once. 2019   Yes [provider]  metFORMIN (GLUCOPHAGE) 1000 MG tablet Take 1 tablet (1,000 mg total) by mouth 2 (two) times daily with a meal. 09/12/18  Yes Recardo Evangelist, PA-C  blood glucose meter kit and supplies KIT Dispense based on patient and insurance preference. Use up to four times daily as directed. (FOR ICD-9 250.00, 250.01). 02/21/19   Oretha Milch D, MD  ondansetron (ZOFRAN ODT) 4 MG disintegrating tablet Take 1 tablet (4 mg total) by mouth every 8 (eight) hours as needed. 02/23/19   Cyris Maalouf A, PA-C  oxyCODONE-acetaminophen (PERCOCET/ROXICET) 5-325 MG tablet Take 1 tablet by mouth every 4 (four) hours as needed for severe pain. 02/23/19   Kaela Beitz A, PA-C  potassium chloride SA (K-DUR) 20 MEQ tablet Take 1 tablet (20 mEq total) by mouth 2 (two) times daily for 5 days. 02/23/19 02/28/19  Lemmie Steinhaus, Laymond Purser, PA-C    Family History Family History  Problem Relation Age of Onset   Hypertension Mother    Anemia Mother     Diabetes Father     Social History Social History   Tobacco Use   Smoking status: Light Tobacco Smoker    Packs/day: 0.50    Types: Cigarettes   Smokeless tobacco: Never Used   Tobacco comment: social use with tobacco  Substance Use Topics   Alcohol use: Yes    Comment: social   Drug use: Not Currently     Allergies   Cetirizine & related; Toradol [ketorolac tromethamine]; Flagyl [metronidazole]; Contrast media [iodinated diagnostic agents]; and Nsaids   Review of Systems Review of Systems  Constitutional: Negative for activity change, chills and fever.  Respiratory: Negative for shortness of breath.   Cardiovascular: Negative for chest pain.  Gastrointestinal: Positive for abdominal pain and nausea. Negative for anal bleeding, blood in  stool, constipation, diarrhea and vomiting.  Genitourinary: Positive for flank pain and pelvic pain. Negative for decreased urine volume, dysuria, vaginal bleeding, vaginal discharge and vaginal pain.  Musculoskeletal: Negative for back pain.  Skin: Positive for wound. Negative for rash.  Allergic/Immunologic: Negative for immunocompromised state.  Neurological: Negative for dizziness, weakness and headaches.  Psychiatric/Behavioral: Negative for confusion.     Physical Exam Updated Vital Signs BP 111/73 (BP Location: Left Arm)    Pulse 98    Temp 98.6 F (37 C) (Oral)    Resp 16    Ht '5\' 7"'$  (1.702 m)    Wt 93 kg    SpO2 96%    BMI 32.11 kg/m   Physical Exam Vitals signs and nursing note reviewed.  Constitutional:      General: She is not in acute distress. HENT:     Head: Normocephalic.  Eyes:     Conjunctiva/sclera: Conjunctivae normal.  Neck:     Musculoskeletal: Neck supple.  Cardiovascular:     Rate and Rhythm: Regular rhythm. Tachycardia present.     Heart sounds: No murmur. No friction rub. No gallop.   Pulmonary:     Effort: Pulmonary effort is normal. No respiratory distress.     Breath sounds: No stridor. No  wheezing, rhonchi or rales.  Chest:     Chest wall: No tenderness.  Abdominal:     General: There is no distension.     Palpations: Abdomen is soft. There is no mass.     Tenderness: There is abdominal tenderness. There is guarding. There is no right CVA tenderness, left CVA tenderness or rebound.     Hernia: No hernia is present.     Comments: Focally tender to palpation in the right pelvic region.  There is some guarding, but no rebound.  Abdomen is obese, but soft and nondistended.  No left-sided tenderness or suprapubic tenderness.  Upper abdomen is unremarkable.  No CVA tenderness bilaterally.  Genitourinary:    Comments: Swelling noted to the right labia majora.  There is spontaneous purulent drainage from the area.  Mild tenderness to palpation.  No overlying erythema or warmth.  No left-sided tenderness of the labia majora.  No tenderness of the mons pubis or perineum. Musculoskeletal:     Right lower leg: No edema.     Left lower leg: No edema.  Skin:    General: Skin is warm.     Findings: No rash.  Neurological:     Mental Status: She is alert.  Psychiatric:        Behavior: Behavior normal.      ED Treatments / Results  Labs (all labs ordered are listed, but only abnormal results are displayed) Labs Reviewed  COMPREHENSIVE METABOLIC PANEL - Abnormal; Notable for the following components:      Result Value   Sodium 129 (*)    Potassium 3.2 (*)    Chloride 91 (*)    Glucose, Bld 524 (*)    Calcium 8.2 (*)    Albumin 3.2 (*)    All other components within normal limits  CBG MONITORING, ED - Abnormal; Notable for the following components:   Glucose-Capillary 552 (*)    All other components within normal limits  CBG MONITORING, ED - Abnormal; Notable for the following components:   Glucose-Capillary 357 (*)    All other components within normal limits  CBC WITH DIFFERENTIAL/PLATELET  LIPASE, BLOOD  I-STAT BETA HCG BLOOD, ED ([facility], [facility], [facility] ONLY)  EKG EKG  Interpretation  Date/Time:  Sunday Feb 23 2019 03:29:53 EDT Ventricular Rate:  106 PR Interval:    QRS Duration: 97 QT Interval:  327 QTC Calculation: 435 R Axis:   -24 Text Interpretation:  Sinus tachycardia Probable left ventricular hypertrophy No significant change was found Confirmed by Molpus, John [phone]) on 02/23/2019 3:35:34 AM   Radiology Ct Abdomen Pelvis Wo Contrast  Result Date: 02/23/2019 CLINICAL DATA:  Abdominal pain EXAM: CT ABDOMEN AND PELVIS WITHOUT CONTRAST TECHNIQUE: Multidetector CT imaging of the abdomen and pelvis was performed following the standard protocol without IV contrast. COMPARISON:  01/08/2019 FINDINGS: Lower chest: Lung bases are clear. No effusions. Heart is normal size. Hepatobiliary: No focal hepatic abnormality. Gallbladder unremarkable. Pancreas: No focal abnormality or ductal dilatation. Spleen: No focal abnormality.  Normal size. Adrenals/Urinary Tract: Small nodules within the right adrenal gland measures 1.6 cm, likely adenoma. No renal or ureteral stones. No hydronephrosis. Urinary bladder is unremarkable. Stomach/Bowel: Stomach, large and small bowel grossly unremarkable. Vascular/Lymphatic: No evidence of aneurysm or adenopathy. Reproductive: Low-density area in the right adnexal region measures 5.1 cm, likely right ovarian cyst. IUD noted in the uterus. Other: No free fluid or free air. Musculoskeletal: No acute bony abnormality. IMPRESSION: No renal or ureteral stones.  No hydronephrosis. 5.1 cm low-density lesion in the right adnexa, likely right ovarian cyst. Stable right adrenal nodule, likely adenoma. Electronically Signed   By: Rolm Baptise M.D.   On: 02/23/2019 03:57   US Transvaginal Non-ob  Result Date: 02/23/2019 CLINICAL DATA:  Initial evaluation for acute right lower quadrant pain. EXAM: TRANSABDOMINAL AND TRANSVAGINAL ULTRASOUND OF PELVIS DOPPLER ULTRASOUND OF OVARIES TECHNIQUE: Both transabdominal and transvaginal ultrasound examinations of  the pelvis were performed. Transabdominal technique was performed for global imaging of the pelvis including uterus, ovaries, adnexal regions, and pelvic cul-de-sac. It was necessary to proceed with endovaginal exam following the transabdominal exam to visualize the uterus, endometrium, and ovaries. Color and duplex Doppler ultrasound was utilized to evaluate blood flow to the ovaries. COMPARISON:  Prior CT from earlier same day. FINDINGS: Uterus Measurements: 8.7 x 3.7 x 3.3 cm = volume: 57.3 mL. No fibroids or other mass visualized. Endometrium IUD in place within the endometrial cavity. Adjacent endometrial stripe not well evaluated. Right ovary Measurements: 6.1 x 4.4 x 4.7 cm = volume: 67.5 mL. 4.9 cm mildly complex right ovarian cyst. Scattered low-level internal echoes which could reflect hemorrhage or debris. No internal solid component or vascularity. Left ovary Measurements: 2.2 x 1.9 x 2.3 cm = volume: 5.4 mL. Normal appearance/no adnexal mass. Pulsed Doppler evaluation of both ovaries demonstrates normal low-resistance arterial and venous waveforms. Other findings No abnormal free fluid. IMPRESSION: 1. 4.9 cm mildly complex right ovarian cyst, most likely a hemorrhagic cyst or possibly physiologic follicular cyst with internal proteinaceous material/debris. Short interval follow-up ultrasound in 6-12 weeks to ensure resolution is suggested as clinically warranted. 2. No evidence for ovarian torsion or other acute finding. 3. IUD in appropriate position within the endometrial cavity. Electronically Signed   By: Jeannine Boga M.D.   On: 02/23/2019 06:05   US Pelvis Complete  Result Date: 02/23/2019 CLINICAL DATA:  Initial evaluation for acute right lower quadrant pain. EXAM: TRANSABDOMINAL AND TRANSVAGINAL ULTRASOUND OF PELVIS DOPPLER ULTRASOUND OF OVARIES TECHNIQUE: Both transabdominal and transvaginal ultrasound examinations of the pelvis were performed. Transabdominal technique was performed  for global imaging of the pelvis including uterus, ovaries, adnexal regions, and pelvic cul-de-sac. It was necessary to proceed with endovaginal  exam following the transabdominal exam to visualize the uterus, endometrium, and ovaries. Color and duplex Doppler ultrasound was utilized to evaluate blood flow to the ovaries. COMPARISON:  Prior CT from earlier same day. FINDINGS: Uterus Measurements: 8.7 x 3.7 x 3.3 cm = volume: 57.3 mL. No fibroids or other mass visualized. Endometrium IUD in place within the endometrial cavity. Adjacent endometrial stripe not well evaluated. Right ovary Measurements: 6.1 x 4.4 x 4.7 cm = volume: 67.5 mL. 4.9 cm mildly complex right ovarian cyst. Scattered low-level internal echoes which could reflect hemorrhage or debris. No internal solid component or vascularity. Left ovary Measurements: 2.2 x 1.9 x 2.3 cm = volume: 5.4 mL. Normal appearance/no adnexal mass. Pulsed Doppler evaluation of both ovaries demonstrates normal low-resistance arterial and venous waveforms. Other findings No abnormal free fluid. IMPRESSION: 1. 4.9 cm mildly complex right ovarian cyst, most likely a hemorrhagic cyst or possibly physiologic follicular cyst with internal proteinaceous material/debris. Short interval follow-up ultrasound in 6-12 weeks to ensure resolution is suggested as clinically warranted. 2. No evidence for ovarian torsion or other acute finding. 3. IUD in appropriate position within the endometrial cavity. Electronically Signed   By: Jeannine Boga M.D.   On: 02/23/2019 06:05   [place] Art/ven Flow Abd Pelv Doppler  Result Date: 02/23/2019 CLINICAL DATA:  Initial evaluation for acute right lower quadrant pain. EXAM: TRANSABDOMINAL AND TRANSVAGINAL ULTRASOUND OF PELVIS DOPPLER ULTRASOUND OF OVARIES TECHNIQUE: Both transabdominal and transvaginal ultrasound examinations of the pelvis were performed. Transabdominal technique was performed for global imaging of the pelvis including uterus,  ovaries, adnexal regions, and pelvic cul-de-sac. It was necessary to proceed with endovaginal exam following the transabdominal exam to visualize the uterus, endometrium, and ovaries. Color and duplex Doppler ultrasound was utilized to evaluate blood flow to the ovaries. COMPARISON:  Prior CT from earlier same day. FINDINGS: Uterus Measurements: 8.7 x 3.7 x 3.3 cm = volume: 57.3 mL. No fibroids or other mass visualized. Endometrium IUD in place within the endometrial cavity. Adjacent endometrial stripe not well evaluated. Right ovary Measurements: 6.1 x 4.4 x 4.7 cm = volume: 67.5 mL. 4.9 cm mildly complex right ovarian cyst. Scattered low-level internal echoes which could reflect hemorrhage or debris. No internal solid component or vascularity. Left ovary Measurements: 2.2 x 1.9 x 2.3 cm = volume: 5.4 mL. Normal appearance/no adnexal mass. Pulsed Doppler evaluation of both ovaries demonstrates normal low-resistance arterial and venous waveforms. Other findings No abnormal free fluid. IMPRESSION: 1. 4.9 cm mildly complex right ovarian cyst, most likely a hemorrhagic cyst or possibly physiologic follicular cyst with internal proteinaceous material/debris. Short interval follow-up ultrasound in 6-12 weeks to ensure resolution is suggested as clinically warranted. 2. No evidence for ovarian torsion or other acute finding. 3. IUD in appropriate position within the endometrial cavity. Electronically Signed   By: Jeannine Boga M.D.   On: 02/23/2019 06:05    Procedures Procedures (including critical care time)  Medications Ordered in ED Medications  sodium chloride 0.9 % bolus 1,000 mL ( Intravenous Stopped 02/23/19 0525)  ondansetron (ZOFRAN) injection 4 mg (4 mg Intravenous Given 02/23/19 0336)  HYDROmorphone (DILAUDID) injection 1 mg (1 mg Intravenous Given 02/23/19 0336)  HYDROmorphone (DILAUDID) injection 1 mg (1 mg Intravenous Given 02/23/19 0505)  diphenhydrAMINE (BENADRYL) injection 12.5 mg (12.5 mg  Intravenous Given 02/23/19 0636)     Initial Impression / Assessment and Plan / ED Course  I have reviewed the triage vital signs and the nursing notes.  Pertinent labs & imaging results  that were available during my care of the patient were reviewed by me and considered in my medical decision making (see chart for details).        33 year old female with a history of diabetes mellitus Type II, chronic pain syndrome, hypertension, PE, history of cocaine abuse, ovarian cyst, and pelvic adhesion disease who presents to the emergency department with a chief complaint of right-sided pelvic pain and nausea.  She was recently discharged after being admitted with DKA and is doing a right hallux abscess.  She has been compliant with home Keflex.  She reports the pain today is very different from pain from her abscess.  She has had no constitutional symptoms.  She is tachycardic on arrival, but afebrile, normotensive, and had no hypoxia.  Labs are notable for glucose of 524 with bicarb of 25 and normal anion gap.  She is hyponatremic at 129 and mildly hypokalemic at 3.2.  She was treated with 2 L of IV fluids and on recheck hyperglycemia with improving.  Heart rate also improved into the 90s.  She has no leukocytosis.  Lipase is normal.  Pregnancy test is negative.  Given Bartholin's abscess with worsening pain on the pain side, CT abdomen pelvis was ordered to ensure patient did not have Fournier gangrene or other worsening deep space infection.  CT abdomen pelvis with 5.1 low-density lesion in the right adnexa, likely ovarian cyst.  I spoke with Dr. Cain Sieve, radiology, regarding imaging to ensure that since study with without contrast due to an allergy to CT iodinated contrast that this was not deep space infection or TOA.  He had low suspicion for these etiologies at this time.  Patient was also discussed with Dr. Florina Ou, attending physician.  Patient had a CT renal stone study performed 6 weeks ago that did  not demonstrate any lesion other than a chronic stable adenoma.  A pelvic ultrasound was ordered which demonstrated a 4.9 mildly complex right ovarian cyst with a hemorrhagic cyst or possibly physiologic follicular cyst with internal proteinaceous material or debris's.  Repeat ultrasound is recommended in 6 to 12 weeks to ensure resolution  No evidence for ovarian torsion or other acute findings was noted.  IUD is appropriately placed.  Discussed these findings with the patient.  She reports that she is feeling much better.  She is tolerating fluids by mouth without difficulty in the ER.  We will plan to have the patient follow-up with OB/GYN in the clinic this week for recheck for her Bartholin's abscess.  Encouraged the patient to continue her entire course of antibiotics at home.  She will be sent home with antiemetic and pain medication. A 85-monthprescription history query was performed using the  CSRS prior to discharge.  She has also been encouraged to continue her home antihyperglycemic medications.  Given hypokalemia, she will complete a short course of potassium chloride.  Prior to discharge, the patient was hemodynamically stable and in no acute distress.  Safe for discharge to home with outpatient follow-up at this time.    Final Clinical Impressions(s) / ED Diagnoses   Final diagnoses:  Complex ovarian cyst  Hyperglycemia  Bartholin's gland abscess  Hypokalemia    ED Discharge Orders         Ordered    potassium chloride SA (K-DUR) 20 MEQ tablet  2 times daily     02/23/19 0623    oxyCODONE-acetaminophen (PERCOCET/ROXICET) 5-325 MG tablet  Every 4 hours PRN     02/23/19 09937  ondansetron (ZOFRAN ODT) 4 MG disintegrating tablet  Every 8 hours PRN     02/23/19 0623           Joline Maxcy A, PA-C 02/23/19 0743    Molpus, Jenny Reichmann, MD 02/26/19 2250

## 2019-02-23 NOTE — Discharge Instructions (Addendum)
Thank you for allowing me to care for you today in the Emergency Department.   Your work-up today demonstrated an ovarian cyst on the right side.  Please follow-up with OB/GYN.  I would recommend calling your office on Monday morning to get an appointment in the clinic next week.  For pain control, you can take 600 mg of ibuprofen with food every 6 hours.  For severe pain, you can take 1 tablet of Percocet every 4-6 hours.  Do not drink alcohol, take other sedating medications, work, or drive while taking this medication because it can cause you to be drowsy.  Continue to take Keflex as prescribed for the Bartholin infection.  You may want to wear a pad for the next few days while it continues to drain.  Sitz baths are available over-the-counter.  Use as directed on the label, but you can sit on them to help with pain and drainage.  You can follow-up with your OB/GYN for this 2.  If you develop nausea, let 1 tablet of Zofran dissolve in your tongue every 8 hours as needed.  Your potassium was slightly low today.  Take 1 tablet of potassium 2 times daily for the next 5 days.  Return to the emergency department if you develop a high fever, persistent vomiting despite taking Zofran, uncontrollable pain, worsening abdominal pain with a high fever, or other new, concerning symptoms.

## 2019-02-25 ENCOUNTER — Emergency Department (HOSPITAL_COMMUNITY): Payer: Medicaid Other

## 2019-02-25 ENCOUNTER — Emergency Department (HOSPITAL_COMMUNITY)
Admission: EM | Admit: 2019-02-25 | Discharge: 2019-02-25 | Disposition: A | Discharge status: Home / Self Care | Payer: Medicaid Other | Attending: Emergency Medicine | Admitting: Emergency Medicine

## 2019-02-25 ENCOUNTER — Other Ambulatory Visit: Payer: Self-pay

## 2019-02-25 DIAGNOSIS — Z7984 Long term (current) use of oral hypoglycemic drugs: Secondary | ICD-10-CM | POA: Diagnosis not present

## 2019-02-25 DIAGNOSIS — E1165 Type 2 diabetes mellitus with hyperglycemia: Secondary | ICD-10-CM | POA: Insufficient documentation

## 2019-02-25 DIAGNOSIS — Z86711 Personal history of pulmonary embolism: Secondary | ICD-10-CM | POA: Diagnosis not present

## 2019-02-25 DIAGNOSIS — L0291 Cutaneous abscess, unspecified: Secondary | ICD-10-CM

## 2019-02-25 DIAGNOSIS — R1031 Right lower quadrant pain: Secondary | ICD-10-CM | POA: Diagnosis present

## 2019-02-25 DIAGNOSIS — R102 Pelvic and perineal pain: Secondary | ICD-10-CM | POA: Diagnosis not present

## 2019-02-25 DIAGNOSIS — N764 Abscess of vulva: Secondary | ICD-10-CM | POA: Insufficient documentation

## 2019-02-25 DIAGNOSIS — Z79899 Other long term (current) drug therapy: Secondary | ICD-10-CM | POA: Insufficient documentation

## 2019-02-25 DIAGNOSIS — R739 Hyperglycemia, unspecified: Secondary | ICD-10-CM

## 2019-02-25 DIAGNOSIS — F17210 Nicotine dependence, cigarettes, uncomplicated: Secondary | ICD-10-CM | POA: Insufficient documentation

## 2019-02-25 LAB — CBC WITH DIFFERENTIAL/PLATELET
Abs Immature Granulocytes: 0.04 10*3/uL (ref 0.00–0.07)
Basophils Absolute: 0 10*3/uL (ref 0.0–0.1)
Basophils Relative: 0 %
Eosinophils Absolute: 0 10*3/uL (ref 0.0–0.5)
Eosinophils Relative: 1 %
HCT: 42.3 % (ref 36.0–46.0)
Hemoglobin: 12.7 g/dL (ref 12.0–15.0)
Immature Granulocytes: 1 %
Lymphocytes Relative: 17 %
Lymphs Abs: 0.8 10*3/uL (ref 0.7–4.0)
MCH: 27.5 pg (ref 26.0–34.0)
MCHC: 30 g/dL (ref 30.0–36.0)
MCV: 91.8 fL (ref 80.0–100.0)
Monocytes Absolute: 0.4 10*3/uL (ref 0.1–1.0)
Monocytes Relative: 8 %
Neutro Abs: 3.5 10*3/uL (ref 1.7–7.7)
Neutrophils Relative %: 73 %
Platelets: UNDETERMINED 10*3/uL (ref 150–400)
RBC: 4.61 MIL/uL (ref 3.87–5.11)
RDW: 14.1 % (ref 11.5–15.5)
WBC: 4.7 10*3/uL (ref 4.0–10.5)
nRBC: 0 % (ref 0.0–0.2)

## 2019-02-25 LAB — BASIC METABOLIC PANEL
Anion gap: 15 (ref 5–15)
Anion gap: 8 (ref 5–15)
BUN: 9 mg/dL (ref 6–20)
BUN: 7 mg/dL (ref 6–20)
CO2: 23 mmol/L (ref 22–32)
CO2: 25 mmol/L (ref 22–32)
Calcium: 9.4 mg/dL (ref 8.9–10.3)
Calcium: 9.1 mg/dL (ref 8.9–10.3)
Chloride: 87 mmol/L — ABNORMAL LOW (ref 98–111)
Chloride: 98 mmol/L (ref 98–111)
Creatinine, Ser: 0.95 mg/dL (ref 0.44–1.00)
Creatinine, Ser: 0.64 mg/dL (ref 0.44–1.00)
GFR calc Af Amer: >60 mL/min (ref >60)
GFR calc Af Amer: >60 mL/min (ref >60)
GFR calc non Af Amer: >60 mL/min (ref >60)
GFR calc non Af Amer: >60 mL/min (ref >60)
Glucose, Bld: 911 mg/dL (ref 70–99)
Glucose, Bld: 448 mg/dL — ABNORMAL HIGH (ref 70–99)
Potassium: 4.6 mmol/L (ref 3.5–5.1)
Potassium: 5.5 mmol/L — ABNORMAL HIGH (ref 3.5–5.1)
Sodium: 125 mmol/L — ABNORMAL LOW (ref 135–145)
Sodium: 131 mmol/L — ABNORMAL LOW (ref 135–145)

## 2019-02-25 LAB — URINALYSIS, MICROSCOPIC (REFLEX)
Bacteria, UA: NONE SEEN
Squamous Epithelial / HPF: NONE SEEN (ref 0–5)

## 2019-02-25 LAB — POCT I-STAT EG7
Acid-Base Excess: 2 mmol/L (ref 0.0–2.0)
Bicarbonate: 26.7 mmol/L (ref 20.0–28.0)
Calcium, Ion: 1.13 mmol/L — ABNORMAL LOW (ref 1.15–1.40)
HCT: 42 % (ref 36.0–46.0)
Hemoglobin: 14.3 g/dL (ref 12.0–15.0)
O2 Saturation: 88 %
Potassium: 4.7 mmol/L (ref 3.5–5.1)
Sodium: 123 mmol/L — ABNORMAL LOW (ref 135–145)
TCO2: 28 mmol/L (ref 22–32)
pCO2, Ven: 41 mmHg — ABNORMAL LOW (ref 44.0–60.0)
pH, Ven: 7.423 (ref 7.250–7.430)
pO2, Ven: 53 mmHg — ABNORMAL HIGH (ref 32.0–45.0)

## 2019-02-25 LAB — WET PREP, GENITAL
Sperm: NONE SEEN
Trich, Wet Prep: NONE SEEN
Yeast Wet Prep HPF POC: NONE SEEN

## 2019-02-25 LAB — URINALYSIS, ROUTINE W REFLEX MICROSCOPIC
Bilirubin Urine: NEGATIVE
Glucose, UA: >=500 mg/dL — AB
Hgb urine dipstick: NEGATIVE
Ketones, ur: NEGATIVE mg/dL
Leukocytes,Ua: NEGATIVE
Nitrite: NEGATIVE
Protein, ur: NEGATIVE mg/dL
Specific Gravity, Urine: 1.025 (ref 1.005–1.030)
pH: 6 (ref 5.0–8.0)

## 2019-02-25 LAB — CULTURE, BLOOD (ROUTINE X 2)
Culture: NO GROWTH
Culture: NO GROWTH
Special Requests: ADEQUATE

## 2019-02-25 LAB — CBG MONITORING, ED
Glucose-Capillary: >600 mg/dL (ref 70–99)
Glucose-Capillary: 427 mg/dL — ABNORMAL HIGH (ref 70–99)

## 2019-02-25 LAB — I-STAT BETA HCG BLOOD, ED (MC, WL, AP ONLY): I-stat hCG, quantitative: <5 m[IU]/mL (ref <5)

## 2019-02-25 MED ORDER — SODIUM CHLORIDE 0.9 % IV BOLUS
2000.0000 mL | Freq: Once | INTRAVENOUS | Status: AC
  Administered 2019-02-25: 14:00:00 2000 mL via INTRAVENOUS

## 2019-02-25 MED ORDER — SODIUM CHLORIDE 0.9 % IV BOLUS: 1000.0000 mL | Freq: Once | INTRAVENOUS | Status: DC

## 2019-02-25 MED ORDER — POTASSIUM CHLORIDE 10 MEQ/100ML IV SOLN
10.0000 meq | INTRAVENOUS | Status: AC
  Filled 2019-02-25: qty 100

## 2019-02-25 MED ORDER — ONDANSETRON HCL 4 MG/2ML IJ SOLN
4.0000 mg | Freq: Once | INTRAMUSCULAR | Status: AC
  Administered 2019-02-25: 14:00:00 4 mg via INTRAVENOUS
  Filled 2019-02-25: qty 2

## 2019-02-25 MED ORDER — OXYCODONE-ACETAMINOPHEN 5-325 MG PO TABS
1.0000 | ORAL_TABLET | Freq: Once | ORAL | Status: DC
  Filled 2019-02-25: qty 1

## 2019-02-25 MED ORDER — MORPHINE SULFATE (PF) 4 MG/ML IV SOLN
8.0000 mg | Freq: Once | INTRAVENOUS | Status: AC
  Administered 2019-02-25: 14:00:00 8 mg via INTRAVENOUS
  Filled 2019-02-25: qty 2

## 2019-02-25 MED ORDER — INSULIN REGULAR(HUMAN) IN NACL 100-0.9 UT/100ML-% IV SOLN
INTRAVENOUS | Status: DC
  Filled 2019-02-25: qty 100

## 2019-02-25 MED ORDER — SODIUM CHLORIDE 0.9 % IV SOLN: INTRAVENOUS | Status: DC

## 2019-02-25 MED ORDER — DOXYCYCLINE HYCLATE 100 MG PO CAPS: 100.0000 mg | ORAL_CAPSULE | Freq: Two times a day (BID) | ORAL | 0 refills | Status: DC

## 2019-02-25 MED ORDER — HYDROMORPHONE HCL 1 MG/ML IJ SOLN
0.5000 mg | Freq: Once | INTRAMUSCULAR | Status: AC
  Administered 2019-02-25: 0.5 mg via INTRAVENOUS
  Filled 2019-02-25: qty 1

## 2019-02-25 NOTE — ED Provider Notes (Signed)
1727: Pt under initial care of Dr Tyrone Nine now in progressive bed pending [place].  CTAP added as well.  See previous note for full details.  Briefly, pt presents with right lower abdominal pain and abscess to vulva.  Vulvar abscess I&D by Dr Tyrone Nine, plan to dc with doxycycline.    Physical Exam  BP (!) 118/107 (BP Location: Left Arm)   Pulse 74   Temp 98.1 F (36.7 C) (Oral)   Resp 15   Ht 5\' 7"  (1.702 m)   Wt 93 kg   SpO2 97%   BMI 32.11 kg/m   Physical Exam Constitutional:      Appearance: She is well-developed. She is not toxic-appearing.  HENT:     Head: Normocephalic.     Right Ear: External ear normal.     Left Ear: External ear normal.     Nose: Nose normal.  Eyes:     Conjunctiva/sclera: Conjunctivae normal.  Neck:     Musculoskeletal: Full passive range of motion without pain.  Cardiovascular:     Rate and Rhythm: Normal rate.  Pulmonary:     Effort: Pulmonary effort is normal. No tachypnea or respiratory distress.  Musculoskeletal: Normal range of motion.  Skin:    General: Skin is warm and dry.     Capillary Refill: Capillary refill takes less than 2 seconds.  Neurological:     Mental Status: She is alert and oriented to person, place, and time.  Psychiatric:        Behavior: Behavior normal.        Thought Content: Thought content normal.     ED Course/Procedures   Clinical Course as of Feb 24 1726  Tue Feb 25, 2019  1712 IMPRESSION: 1. Complex cystic lesion of the right ovary. The internal and vascular characteristics of this lesion are better characterized on today's ultrasound than on noncontrast CT. No significant free pelvic fluid. 2. Mild sub solid densities posteriorly in the right lower lobe and in the lingula. The appearance is nonspecific and could be seen in setting of mild alveolitis or atypical pneumonia.  CT ABDOMEN PELVIS WO CONTRAST [CG]  1713 IMPRESSION: 1. No evidence of ovarian torsion. 2. Large cystic lesion within the RIGHT kidney likely  represents a hemorrhagic ovarian cyst. With small nodules along cyst wall, cystic ovarian neoplasm warrants exclusion. Due to size of lesion, recommend follow-up ultrasound in 6-12 weeks. This recommendation follows the consensus statement: Management of Asymptomatic Ovarian and Other Adnexal Cysts Imaged at [place]: Society of Radiologists in [place]. Radiology 2010; [phone]. 3. Normal LEFT ovary. 4. Normal uterus with IUD noted.  US Transvaginal Non-OB [CG]    Clinical Course User Index [CG] Kinnie Feil, PA-C    Procedures  MDM  1759: ED work up reviewed. Delay in labs due to lab being down for several hours.  I explained this to patient.   Hyperglycemia with BMP Na 125, K 4.6, Co2 23, AG 15.  In setting of missing insulin today and eating spaghetti/juice PTA.  has received 2 L IVF and glucose 911>420. Recommended repeat BMP and insulin in ED but patient requested discharge. She is well appearing clinically she is HD stable, tolerating PO. Warned about hyperglycemia leading to DKA but she turned down further ED treatment for this AMA. Encouraged adherence to insulin and diet modifications.     Incidental new finding of CT of densities in right lung discussed with patient, she denies CP, Sob, cough. Recommended f/u with PCP  for this.   Pt is aware of RIGHT ovarian cyst from previous ED visits and need to f/u with OBGYN for repeat ultrasound.  Teary eyed because she still has pain and was unable to pick up her prescription for pain medicines at pharmacy from first ED visit because Medicaid won't cover rx from ED.  Recommended 1g tylenol, pelvic rest and close f/u for pelvic US in 12 weeks.  She has contacted OBGYN and appointment for end of June. Pending UA at discharge, she did not want to wait for results.    Kinnie Feil, PA-C 02/25/19 1940    Tegeler, Gwenyth Allegra, MD 02/26/19 [phone]

## 2019-02-25 NOTE — ED Notes (Signed)
Minimal drainage noted at right labia for I&D. Dressing applied

## 2019-02-25 NOTE — Discharge Instructions (Addendum)
Follow up with OBGYN for your abscess and ovarian cyst.  Return for fever or worsening pain. Warm compresses 4x a day for at least the next week. Take 1000 mg acetaminophen every 6 hours for pain.   Your sugar was elevated likely from missing your medicines last night and last night's meal.  This came down to 400s.  You declined repeat blood work and insulin in the ED and requested discharge.  Resume your diabetes medicines

## 2019-02-25 NOTE — ED Provider Notes (Addendum)
[facility] EMERGENCY DEPARTMENT Provider Note   CSN: [id number] Arrival date & time: 02/25/19  1256    History   Chief Complaint Chief Complaint  Patient presents with   Abdominal Pain    HPI Jodi Mcgee is a 33 y.o. female.     33 yo F with a chief complaints of right lower quadrant abdominal pain.  This is been going on for the past week or so.  She has been seen a few times in the emergency department for this.  At the onset she was diagnosed with diabetic ketoacidosis and kept in the hospital for a few days.  She has had a abscess to the right vulva.  Has not yet had that drained.  Denies fevers or chills denies nausea or vomiting.  Pain is worse in the right groin.  She was seen in the ED recently and had a CT scan that showed a possible ovarian cyst and an ultrasound that confirmed an ovarian cyst.  It is believed that that was the site of her pain.  She feels it is worsened over the past few days.  The history is provided by the patient.  Abdominal Pain  Pain location:  RLQ Pain quality: sharp and shooting   Pain radiates to:  Does not radiate Pain severity:  Moderate Onset quality:  Gradual Duration:  2 weeks Timing:  Constant Progression:  Worsening Chronicity:  New Relieved by:  Nothing Worsened by:  Nothing Ineffective treatments:  None tried Associated symptoms: no chest pain, no chills, no dysuria, no fever, no nausea, no shortness of breath and no vomiting     Past Medical History:  Diagnosis Date   Anemia    Anxiety    Blood transfusion without reported diagnosis    both CS   Diabetes mellitus without complication (HCC)    GERD (gastroesophageal reflux disease)    has resolved   IBS (irritable bowel syndrome)    Ovarian cyst    Peritonitis, acute generalized ([place])    Pulmonary embolism (HCC)    Sepsis ([place])     Patient Active Problem List   Diagnosis Date Noted   Bartholin's gland abscess 02/21/2019   DKA  (diabetic ketoacidoses) ([place]) 02/20/2019   Cellulitis of labia 02/20/2019   Blood per rectum 02/20/2019   Uncontrolled type 2 DM with hyperosmolar nonketotic hyperglycemia ([place]) 09/26/2018   Hyponatremia 09/26/2018   Uncontrolled type 2 diabetes mellitus with hyperosmolar nonketotic hyperglycemia ([place]) 09/26/2018   History of cesarean section, classical 01/03/2018   Chronic hypertension 01/03/2018   IUD contraception 01/03/2018   Postpartum care and examination 01/03/2018   Pulmonary embolism during puerperium 12/04/2017   Pulmonary edema 11/30/2017   S/P cesarean section 11/21/2017   Pelvic adhesive disease 10/22/2017   Nasal septal perforation 10/17/2017   History of cocaine abuse ([place]) 10/17/2017   Epistaxis 10/17/2017   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   Obesity (BMI 30-39.9) 09/25/2017   Hypertension in pregnancy, preeclampsia, severe, delivered/postpartum 09/13/2017   Previous cesarean section complicating pregnancy 16/60/6301   Type 2 diabetes mellitus, without long-term current use of insulin ([place]) 08/23/2017   Chronic pain syndrome 08/23/2017    Past Surgical History:  Procedure Laterality Date   APPENDECTOMY     ruptured    BUNIONECTOMY     CESAREAN SECTION     CESAREAN SECTION Bilateral 11/20/2017   Procedure: CESAREAN SECTION;  Surgeon: Sloan Leiter, MD;  Location: [facility];  Service: Obstetrics;  Laterality:  Bilateral;   OVARIAN CYST DRAINAGE     SMALL BOWEL REPAIR       OB History    Gravida  3   Para  3   Term  1   Preterm  2   AB  0   Living  3     SAB  0   TAB  0   Ectopic  0   Multiple  0   Live Births  3            Home Medications    Prior to Admission medications   Medication Sig Start Date End Date Taking? Authorizing Provider  acetaminophen (TYLENOL) 500 MG tablet Take 1,000 mg by mouth every 8 (eight) hours as needed for moderate pain.     [provider]  blood  glucose meter kit and supplies KIT Dispense based on patient and insurance preference. Use up to four times daily as directed. (FOR ICD-9 250.00, 250.01). 02/21/19   Oretha Milch D, MD  cephALEXin (KEFLEX) 500 MG capsule Take 1 capsule (500 mg total) by mouth every 12 (twelve) hours. 02/21/19   Desiree Hane, MD  doxycycline (VIBRAMYCIN) 100 MG capsule Take 1 capsule (100 mg total) by mouth 2 (two) times daily. 02/25/19   Deno Etienne, DO  glipiZIDE (GLUCOTROL) 5 MG tablet Take 1 tablet (5 mg total) by mouth 2 (two) times daily before a meal. Patient taking differently: Take 5 mg by mouth daily.  09/27/18   Charlynne Cousins, MD  insulin detemir (LEVEMIR) 100 unit/ml SOLN Inject 0.2 mLs (20 Units total) into the skin 2 (two) times daily. Patient taking differently: Inject 20 Units into the skin 3 (three) times daily.  01/08/19   Little, Wenda Overland, MD  Levonorgestrel (LILETTA) 19.5 MCG/DAY IUD IUD 1 each by Intrauterine route once. 2019    [provider]  metFORMIN (GLUCOPHAGE) 1000 MG tablet Take 1 tablet (1,000 mg total) by mouth 2 (two) times daily with a meal. 09/12/18   Ruthann Cancer, Jesse Fall, PA-C  ondansetron (ZOFRAN ODT) 4 MG disintegrating tablet Take 1 tablet (4 mg total) by mouth every 8 (eight) hours as needed. 02/23/19   McDonald, Mia A, PA-C  oxyCODONE-acetaminophen (PERCOCET/ROXICET) 5-325 MG tablet Take 1 tablet by mouth every 4 (four) hours as needed for severe pain. 02/23/19   McDonald, Mia A, PA-C  potassium chloride SA (K-DUR) 20 MEQ tablet Take 1 tablet (20 mEq total) by mouth 2 (two) times daily for 5 days. 02/23/19 02/28/19  McDonald, Laymond Purser, PA-C    Family History Family History  Problem Relation Age of Onset   Hypertension Mother    Anemia Mother    Diabetes Father     Social History Social History   Tobacco Use   Smoking status: Light Tobacco Smoker    Packs/day: 0.50    Types: Cigarettes   Smokeless tobacco: Never Used   Tobacco comment: social use with  tobacco  Substance Use Topics   Alcohol use: Yes    Comment: social   Drug use: Not Currently     Allergies   Cetirizine & related; Toradol [ketorolac tromethamine]; Flagyl [metronidazole]; Contrast media [iodinated diagnostic agents]; and Nsaids   Review of Systems Review of Systems  Constitutional: Negative for chills and fever.  HENT: Negative for congestion and rhinorrhea.   Eyes: Negative for redness and visual disturbance.  Respiratory: Negative for shortness of breath and wheezing.   Cardiovascular: Negative for chest pain and palpitations.  Gastrointestinal: Positive  for abdominal pain. Negative for nausea and vomiting.  Genitourinary: Negative for dysuria and urgency.  Musculoskeletal: Negative for arthralgias and myalgias.  Skin: Negative for pallor and wound.  Neurological: Negative for dizziness and headaches.     Physical Exam Updated Vital Signs BP (!) 118/107 (BP Location: Left Arm)    Pulse 74    Temp 98.1 F (36.7 C) (Oral)    Resp 15    Ht '5\' 7"'  (1.702 m)    Wt 93 kg    SpO2 97%    BMI 32.11 kg/m   Physical Exam Vitals signs and nursing note reviewed.  Constitutional:      General: She is not in acute distress.    Appearance: She is well-developed. She is not diaphoretic.  HENT:     Head: Normocephalic and atraumatic.  Eyes:     Pupils: Pupils are equal, round, and reactive to light.  Neck:     Musculoskeletal: Normal range of motion and neck supple.  Cardiovascular:     Rate and Rhythm: Normal rate and regular rhythm.     Heart sounds: No murmur. No friction rub. No gallop.   Pulmonary:     Effort: Pulmonary effort is normal.     Breath sounds: No wheezing or rales.  Abdominal:     General: There is no distension.     Palpations: Abdomen is soft.     Tenderness: There is no abdominal tenderness.  Genitourinary:      Comments: Mild CMT, right adnexal region with pain, mobile nodule.  Musculoskeletal:        General: No tenderness.   Skin:    General: Skin is warm and dry.  Neurological:     Mental Status: She is alert and oriented to person, place, and time.  Psychiatric:        Behavior: Behavior normal.      ED Treatments / Results  Labs (all labs ordered are listed, but only abnormal results are displayed) Labs Reviewed  WET PREP, GENITAL - Abnormal; Notable for the following components:      Result Value   Clue Cells Wet Prep HPF POC PRESENT (*)    WBC, Wet Prep HPF POC MODERATE (*)    All other components within normal limits  URINALYSIS, ROUTINE W REFLEX MICROSCOPIC - Abnormal; Notable for the following components:   Color, Urine STRAW (*)    Glucose, UA >=500 (*)    All other components within normal limits  BASIC METABOLIC PANEL - Abnormal; Notable for the following components:   Sodium 125 (*)    Chloride 87 (*)    Glucose, Bld 911 (*)    All other components within normal limits  BASIC METABOLIC PANEL - Abnormal; Notable for the following components:   Sodium 131 (*)    Potassium 5.5 (*)    Glucose, Bld 448 (*)    All other components within normal limits  CBG MONITORING, ED - Abnormal; Notable for the following components:   Glucose-Capillary >600 (*)    All other components within normal limits  POCT I-STAT EG7 - Abnormal; Notable for the following components:   pCO2, Ven 41.0 (*)    pO2, Ven 53.0 (*)    Sodium 123 (*)    Calcium, Ion 1.13 (*)    All other components within normal limits  CBG MONITORING, ED - Abnormal; Notable for the following components:   Glucose-Capillary 427 (*)    All other components within normal limits  RPR  HIV ANTIBODY (ROUTINE TESTING W REFLEX)  CBC WITH DIFFERENTIAL/PLATELET  URINALYSIS, MICROSCOPIC (REFLEX)  CBC WITH DIFFERENTIAL/PLATELET  I-STAT BETA HCG BLOOD, ED ([facility], [facility], [facility] ONLY)  GC/CHLAMYDIA PROBE AMP ([employer]) NOT AT [facility]    EKG None  Radiology Ct Abdomen Pelvis Wo Contrast  Result Date: 02/25/2019 CLINICAL DATA:  Right lower  quadrant abdominal pain. EXAM: CT ABDOMEN AND PELVIS WITHOUT CONTRAST TECHNIQUE: Multidetector CT imaging of the abdomen and pelvis was performed following the standard protocol without IV contrast. COMPARISON:  Multiple exams, including 02/23/2019 CT scan and pelvic ultrasounds of 02/25/2019 and 02/23/2019. FINDINGS: Lower chest: Indistinct sub solid density posteriorly in the right lower lobe on image 1/5. Similar sub solid 1.7 cm density in the lingula on image 4/5. Hepatobiliary: Contracted gallbladder.  Otherwise unremarkable. Pancreas: Unremarkable Spleen: Unremarkable Adrenals/Urinary Tract: Unremarkable Stomach/Bowel: Appendix surgically absent.  No dilated bowel. Vascular/Lymphatic: Unremarkable Reproductive: IUD noted, satisfactorily positioned along the uterus. A likely complex lesion of the right ovary is present, with a combination of the ovary in this lesion measuring 6.4 by 6.3 cm on image 72/3, and previously by my measurements at 5.6 by 6.6 cm on 02/23/2019 CT scan. On the ultrasound exam performed today, the right ovarian prominence was due to a mildly complex 5.7 cm right ovarian cystic lesion. Other: No free pelvic fluid. Musculoskeletal: Unremarkable IMPRESSION: 1. Complex cystic lesion of the right ovary. The internal and vascular characteristics of this lesion are better characterized on today's ultrasound than on noncontrast CT. No significant free pelvic fluid. 2. Mild sub solid densities posteriorly in the right lower lobe and in the lingula. The appearance is nonspecific and could be seen in setting of mild alveolitis or atypical pneumonia. Electronically Signed   By: Van Clines M.D.   On: 02/25/2019 17:04   US Transvaginal Non-ob  Result Date: 02/25/2019 CLINICAL DATA:  RIGHT adnexal pain.  Prior Caesarean section. EXAM: TRANSABDOMINAL ULTRASOUND OF PELVIS DOPPLER ULTRASOUND OF OVARIES TECHNIQUE: Transabdominal ultrasound examination of the pelvis was performed including  evaluation of the uterus, ovaries, adnexal regions, and pelvic cul-de-sac. Color and duplex Doppler ultrasound was utilized to evaluate blood flow to the ovaries. COMPARISON:  Pelvic ultrasound 2 days prior (02/23/2019 FINDINGS: Uterus Measurements: Normal in size and echogenicity measuring 8.7 x 4.3 x 4.7 cm. = volume: 87 mL. No fibroids or other mass visualized. Endometrium Thickness: Normal for premenopausal female at 12 mm. IUD noted. No focal abnormality visualized. Right ovary Measurements: 6.0 x 4.9 x 5.6 cm = volume: 86 mL. Large mildly anechoic cystic structure within the RIGHT ovary measures 5.7 x 5.2 x 4.9 cm. There are a fine internal echoes within the is cystic lesion. Mild nodularity along 1 of the cyst walls with 3 mm cyst wall nodules. There is no internal enhancement or nodularity identified. No significant vascularity. Left ovary Measurements: 2.7 x 2.3 x 2.1 cm = volume: 6.7 mL. Normal appearance/no adnexal mass. Pulsed Doppler evaluation demonstrates normal low-resistance arterial and venous waveforms in both ovaries. Other: Trace free fluid IMPRESSION: 1. No evidence of ovarian torsion. 2. Large cystic lesion within the RIGHT kidney likely represents a hemorrhagic ovarian cyst. With small nodules along cyst wall, cystic ovarian neoplasm warrants exclusion. Due to size of lesion, recommend follow-up ultrasound in 6-12 weeks. This recommendation follows the consensus statement: Management of Asymptomatic Ovarian and Other Adnexal Cysts Imaged at [place]: Society of Radiologists in [place]. Radiology 2010; [phone]. 3. Normal LEFT ovary. 4. Normal uterus with IUD noted. Electronically  Signed   By: Suzy Bouchard M.D.   On: 02/25/2019 16:29   [place] Art/ven Flow Abd Pelv Doppler  Result Date: 02/25/2019 CLINICAL DATA:  RIGHT adnexal pain.  Prior Caesarean section. EXAM: TRANSABDOMINAL ULTRASOUND OF PELVIS DOPPLER ULTRASOUND OF OVARIES TECHNIQUE: Transabdominal  ultrasound examination of the pelvis was performed including evaluation of the uterus, ovaries, adnexal regions, and pelvic cul-de-sac. Color and duplex Doppler ultrasound was utilized to evaluate blood flow to the ovaries. COMPARISON:  Pelvic ultrasound 2 days prior (02/23/2019 FINDINGS: Uterus Measurements: Normal in size and echogenicity measuring 8.7 x 4.3 x 4.7 cm. = volume: 87 mL. No fibroids or other mass visualized. Endometrium Thickness: Normal for premenopausal female at 12 mm. IUD noted. No focal abnormality visualized. Right ovary Measurements: 6.0 x 4.9 x 5.6 cm = volume: 86 mL. Large mildly anechoic cystic structure within the RIGHT ovary measures 5.7 x 5.2 x 4.9 cm. There are a fine internal echoes within the is cystic lesion. Mild nodularity along 1 of the cyst walls with 3 mm cyst wall nodules. There is no internal enhancement or nodularity identified. No significant vascularity. Left ovary Measurements: 2.7 x 2.3 x 2.1 cm = volume: 6.7 mL. Normal appearance/no adnexal mass. Pulsed Doppler evaluation demonstrates normal low-resistance arterial and venous waveforms in both ovaries. Other: Trace free fluid IMPRESSION: 1. No evidence of ovarian torsion. 2. Large cystic lesion within the RIGHT kidney likely represents a hemorrhagic ovarian cyst. With small nodules along cyst wall, cystic ovarian neoplasm warrants exclusion. Due to size of lesion, recommend follow-up ultrasound in 6-12 weeks. This recommendation follows the consensus statement: Management of Asymptomatic Ovarian and Other Adnexal Cysts Imaged at [place]: Society of Radiologists in [place]. Radiology 2010; [phone]. 3. Normal LEFT ovary. 4. Normal uterus with IUD noted. Electronically Signed   By: Suzy Bouchard M.D.   On: 02/25/2019 16:29    Procedures .Marland KitchenIncision and Drainage Date/Time: 02/25/2019 2:52 PM Performed by: Deno Etienne, DO Authorized by: Deno Etienne, DO   Consent:    Consent obtained:   Verbal   Consent given by:  Patient   Risks discussed:  Bleeding, incomplete drainage and infection   Alternatives discussed:  Delayed treatment and alternative treatment Location:    Type:  Abscess   Location:  Anogenital   Anogenital location:  Vulva Pre-procedure details:    Skin preparation:  Chloraprep Anesthesia (see MAR for exact dosages):    Anesthesia method:  Local infiltration   Local anesthetic:  Lidocaine 2% WITH epi Procedure type:    Complexity:  Complex Procedure details:    Needle aspiration: no     Incision types:  Single straight   Incision depth:  Subcutaneous   Scalpel blade:  11   Wound management:  Probed and deloculated   Drainage:  Purulent   Drainage amount:  Copious   Wound treatment:  Wound left open   Packing materials:  None Post-procedure details:    Patient tolerance of procedure:  Tolerated well, no immediate complications   (including critical care time)  Medications Ordered in ED Medications  potassium chloride 10 mEq in 100 mL IVPB (10 mEq Intravenous Not Given 02/25/19 1817)  sodium chloride 0.9 % bolus 2,000 mL (0 mLs Intravenous Stopped 02/25/19 1815)  morphine 4 MG/ML injection 8 mg (8 mg Intravenous Given 02/25/19 1422)  ondansetron (ZOFRAN) injection 4 mg (4 mg Intravenous Given 02/25/19 1421)  HYDROmorphone (DILAUDID) injection 0.5 mg (0.5 mg Intravenous Given 02/25/19 1501)     Initial Impression /  Assessment and Plan / ED Course  I have reviewed the triage vital signs and the nursing notes.  Pertinent labs & imaging results that were available during my care of the patient were reviewed by me and considered in my medical decision making (see chart for details).  Clinical Course as of Feb 25 1530  Tue Feb 25, 2019  1712 IMPRESSION: 1. Complex cystic lesion of the right ovary. The internal and vascular characteristics of this lesion are better characterized on today's ultrasound than on noncontrast CT. No significant free pelvic  fluid. 2. Mild sub solid densities posteriorly in the right lower lobe and in the lingula. The appearance is nonspecific and could be seen in setting of mild alveolitis or atypical pneumonia.  CT ABDOMEN PELVIS WO CONTRAST [CG]  1713 IMPRESSION: 1. No evidence of ovarian torsion. 2. Large cystic lesion within the RIGHT kidney likely represents a hemorrhagic ovarian cyst. With small nodules along cyst wall, cystic ovarian neoplasm warrants exclusion. Due to size of lesion, recommend follow-up ultrasound in 6-12 weeks. This recommendation follows the consensus statement: Management of Asymptomatic Ovarian and Other Adnexal Cysts Imaged at [place]: Society of Radiologists in [place]. Radiology 2010; [phone]. 3. Normal LEFT ovary. 4. Normal uterus with IUD noted.  US Transvaginal Non-OB [CG]    Clinical Course User Index [CG] Kinnie Feil, PA-C       33 yo F with a cc right groin pain. Going on for the past couple days.  Patient with adnexal pain vs lymphadenopathy.  Will screen for DKA.  Fluids pain medicine.  I&D abscess.   Will send for pelvic US.    [facility] to [facility] while awaiting labs, [place].  Please see their note for further details of care.   The patients results and plan were reviewed and discussed.   Any x-rays performed were independently reviewed by myself.   Differential diagnosis were considered with the presenting HPI.  Medications  potassium chloride 10 mEq in 100 mL IVPB (10 mEq Intravenous Not Given 02/25/19 1817)  sodium chloride 0.9 % bolus 2,000 mL (0 mLs Intravenous Stopped 02/25/19 1815)  morphine 4 MG/ML injection 8 mg (8 mg Intravenous Given 02/25/19 1422)  ondansetron (ZOFRAN) injection 4 mg (4 mg Intravenous Given 02/25/19 1421)  HYDROmorphone (DILAUDID) injection 0.5 mg (0.5 mg Intravenous Given 02/25/19 1501)    Vitals:   02/25/19 1500 02/25/19 1525 02/25/19 1637 02/25/19 1711  BP: 117/81 113/81 101/69 (!) 118/107   Pulse: 83 81 85 74  Resp: '14 15 14 15  ' Temp:      TempSrc:      SpO2: 100% 100%  97%  Weight:      Height:        Final diagnoses:  Abscess  Pelvic pain  Hyperglycemia      Final Clinical Impressions(s) / ED Diagnoses   Final diagnoses:  Abscess  Pelvic pain  Hyperglycemia    ED Discharge Orders         Ordered    doxycycline (VIBRAMYCIN) 100 MG capsule  2 times daily     02/25/19 1457           Deno Etienne, DO 02/25/19 [place], [place], DO 02/26/19 1531

## 2019-02-25 NOTE — ED Notes (Signed)
Patient transported to CT 

## 2019-02-25 NOTE — ED Notes (Signed)
Pt refused insulin drip and stated she would self administer at home. Educated pt on importance of medicine and following treatment.

## 2019-02-25 NOTE — ED Triage Notes (Signed)
Pt in c/o abdominal pain with R lower abd pain, pt reports dx of R ovarian cyst 02/21/19, pt reports recent admission for DKA at [facility] on 4/30, pt tearful, pt denies n/v/d, A&O x4

## 2019-02-25 NOTE — ED Notes (Signed)
CBG 427 at 1724

## 2019-02-25 NOTE — ED Provider Notes (Signed)
Medical screening examination/treatment/procedure(s) were conducted as a shared visit with non-physician practitioner(s) and myself.  I personally evaluated the patient during the encounter.  Clinical Impression:   Final diagnoses:  Abscess  Pelvic pain  Hyperglycemia    Change of shift, care signed out from prior team, patient pending labs and ultrasound.  Known to have vulvar abscess and history of hyperglycemia presenting with abdominal pain.  The patient is a 33 year old female, she has a known history of diabetes, based on my evaluation of the medical record the patient has been seen multiple times for hyperglycemia or hyperglycemic type complications including in the recent past and admission less than 1 week ago for DKA, March 18 for hyperglycemia, March 6 for hyperglycemia, February 23 for hyperglycemia, yeast infections, January 6 for hyperglycemia, September 26, 2018 for hyperglycemia etc.  She presents today complaining of some mid abdominal pain for which she states she did not take her insulin but rather decided to come to the emergency department.  She was found to have a large vulvar abscess which was drained by Dr. Tyrone Nine, please see his separate documentation.  On my exam the patient actually has fairly normal vital signs but is very dry in the mouth, mild mid abdominal tenderness without guarding or peritoneal signs.  She is not tachycardic, her mucous membranes are dry, her blood work shows a glucose of 911 with a pseudohyponatremia of 125, potassium of 4.6, CO2 of 23 and an anion gap around 15.  Her creatinine is 0.9 and her BUN is 9.  She has been given IV fluids and I have started an insulin drip.  The patient will go for CT scan rather than an ultrasound of her pelvis given the mid abdominal tenderness at this time.   Noemi Chapel, MD 03/03/19 [phone]

## 2019-02-25 NOTE — ED Notes (Signed)
Dr. Sabra Heck aware of elevated blood glucose. 911.

## 2019-02-25 NOTE — ED Notes (Signed)
CBG elevated over 600.

## 2019-02-25 NOTE — ED Notes (Signed)
Pt able to tolerate water, states she feels fine after.

## 2019-02-26 LAB — GC/CHLAMYDIA PROBE AMP (~~LOC~~) NOT AT ARMC
Chlamydia: NEGATIVE
Neisseria Gonorrhea: NEGATIVE

## 2019-02-26 LAB — RPR: RPR Ser Ql: NONREACTIVE

## 2019-02-26 LAB — HIV ANTIBODY (ROUTINE TESTING W REFLEX): HIV Screen 4th Generation wRfx: NONREACTIVE

## 2019-03-13 ENCOUNTER — Telehealth: Payer: Self-pay | Admitting: Obstetrics and Gynecology

## 2019-03-13 NOTE — Telephone Encounter (Signed)
Called the patient to inform of wearing a face mask, our new location, and restrictions due to [place].

## 2019-03-14 ENCOUNTER — Other Ambulatory Visit: Payer: Self-pay

## 2019-03-14 ENCOUNTER — Ambulatory Visit (INDEPENDENT_AMBULATORY_CARE_PROVIDER_SITE_OTHER): Payer: Medicaid Other | Admitting: Obstetrics & Gynecology

## 2019-03-14 ENCOUNTER — Ambulatory Visit (HOSPITAL_COMMUNITY)
Admission: RE | Admit: 2019-03-14 | Discharge: 2019-03-14 | Disposition: A | Discharge status: Home / Self Care | Payer: Medicaid Other | Source: Ambulatory Visit | Attending: Obstetrics & Gynecology | Admitting: Obstetrics & Gynecology

## 2019-03-14 ENCOUNTER — Encounter: Payer: Self-pay | Admitting: Obstetrics & Gynecology

## 2019-03-14 VITALS — BP 111/72 | HR 109 | Temp 98.6°F | Ht 67.0 in | Wt 200.1 lb

## 2019-03-14 DIAGNOSIS — N764 Abscess of vulva: Secondary | ICD-10-CM

## 2019-03-14 DIAGNOSIS — N83291 Other ovarian cyst, right side: Secondary | ICD-10-CM

## 2019-03-14 DIAGNOSIS — N83292 Other ovarian cyst, left side: Secondary | ICD-10-CM | POA: Diagnosis not present

## 2019-03-14 DIAGNOSIS — N83299 Other ovarian cyst, unspecified side: Secondary | ICD-10-CM

## 2019-03-14 MED ORDER — TRAMADOL HCL 50 MG PO TABS: 100.0000 mg | ORAL_TABLET | Freq: Four times a day (QID) | ORAL | 0 refills | Status: DC | PRN

## 2019-03-14 NOTE — Progress Notes (Signed)
GYNECOLOGY OFFICE VISIT NOTE  History:   Jodi Mcgee is a 33 y.o. [phone] here today for follow up after being seen in the ED on 02/25/2019 for right vulvar abscess and large complex right ovarian cyst.  She has no concerns about the vulvar abscess which has resolved.  However, she has worsening RLQ pain and has had some nausea and vomiting for the past two days requiring Zofran.  She is unable to take NSAIDs, Tylenol  Has not helped. She is very worried, pain rated 8/10, radiates to her back. She denies any abnormal vaginal discharge, bleeding, fevers or other concerns.  Of note, she last ate around 9 pm last night, had some water this morning. History of multiple cesarean sections with adhesive disease noted during surgery.    Past Medical History:  Diagnosis Date  . Anemia   . Anxiety   . Blood transfusion without reported diagnosis    both CS  . Diabetes mellitus without complication ([place])   . GERD (gastroesophageal reflux disease)    has resolved  . IBS (irritable bowel syndrome)   . Ovarian cyst   . Peritonitis, acute generalized ([place])   . Pulmonary embolism ([place])   . Sepsis [facility])     Past Surgical History:  Procedure Laterality Date  . APPENDECTOMY     ruptured   . BUNIONECTOMY    . CESAREAN SECTION    . CESAREAN SECTION Bilateral 11/20/2017   Procedure: CESAREAN SECTION;  Surgeon: Sloan Leiter, MD;  Location: [place];  Service: Obstetrics;  Laterality: Bilateral;  . OVARIAN CYST DRAINAGE    . SMALL BOWEL REPAIR      The following portions of the patient's history were reviewed and updated as appropriate: allergies, current medications, past family history, past medical history, past social history, past surgical history and problem list.   Health Maintenance:  Normal pap and negative HRHPV on 01/03/2018.    Review of Systems:  Pertinent items noted in HPI and remainder of comprehensive ROS otherwise negative.  Physical Exam:  BP 111/72   Pulse (!)  109   Temp 98.6 F (37 C)   Ht 5\' 7"  (1.702 m)   Wt 200 lb 1.6 oz (90.8 kg)   BMI 31.34 kg/m  CONSTITUTIONAL: Well-developed, well-nourished female in no acute distress.  HEENT:  Normocephalic, atraumatic. External right and left ear normal. No scleral icterus.  NECK: Normal range of motion, supple, no masses noted on observation SKIN: No rash noted. Not diaphoretic. No erythema. No pallor. MUSCULOSKELETAL: Normal range of motion. No edema noted. NEUROLOGIC: Alert and oriented to person, place, and time. Normal muscle tone coordination. No cranial nerve deficit noted. PSYCHIATRIC: Normal mood and affect. Normal behavior. Normal judgment and thought content. CARDIOVASCULAR: Normal heart rate noted RESPIRATORY: Effort and breath sounds normal, no problems with respiration noted ABDOMEN: Moderate RLQ tenderness to palpation, no rebound or guarding. No masses palpated. No other overt distention noted.   PELVIC: Normal appearing external genitalia; resolved vulvar abscess, normal appearing distal vaginal mucosa.  No abnormal discharge noted. Bimanual exam deferred  Labs and Imaging Ct Abdomen Pelvis Wo Contrast  Result Date: 02/25/2019 CLINICAL DATA:  Right lower quadrant abdominal pain. EXAM: CT ABDOMEN AND PELVIS WITHOUT CONTRAST TECHNIQUE: Multidetector CT imaging of the abdomen and pelvis was performed following the standard protocol without IV contrast. COMPARISON:  Multiple exams, including 02/23/2019 CT scan and pelvic ultrasounds of 02/25/2019 and 02/23/2019. FINDINGS: Lower chest: Indistinct sub solid density posteriorly in the  right lower lobe on image 1/5. Similar sub solid 1.7 cm density in the lingula on image 4/5. Hepatobiliary: Contracted gallbladder.  Otherwise unremarkable. Pancreas: Unremarkable Spleen: Unremarkable Adrenals/Urinary Tract: Unremarkable Stomach/Bowel: Appendix surgically absent.  No dilated bowel. Vascular/Lymphatic: Unremarkable Reproductive: IUD noted,  satisfactorily positioned along the uterus. A likely complex lesion of the right ovary is present, with a combination of the ovary in this lesion measuring 6.4 by 6.3 cm on image 72/3, and previously by my measurements at 5.6 by 6.6 cm on 02/23/2019 CT scan. On the ultrasound exam performed today, the right ovarian prominence was due to a mildly complex 5.7 cm right ovarian cystic lesion. Other: No free pelvic fluid. Musculoskeletal: Unremarkable IMPRESSION: 1. Complex cystic lesion of the right ovary. The internal and vascular characteristics of this lesion are better characterized on today's ultrasound than on noncontrast CT. No significant free pelvic fluid. 2. Mild sub solid densities posteriorly in the right lower lobe and in the lingula. The appearance is nonspecific and could be seen in setting of mild alveolitis or atypical pneumonia. Electronically Signed   By: Van Clines M.D.   On: 02/25/2019 17:04   Ct Abdomen Pelvis Wo Contrast  Result Date: 02/23/2019 CLINICAL DATA:  Abdominal pain EXAM: CT ABDOMEN AND PELVIS WITHOUT CONTRAST TECHNIQUE: Multidetector CT imaging of the abdomen and pelvis was performed following the standard protocol without IV contrast. COMPARISON:  01/08/2019 FINDINGS: Lower chest: Lung bases are clear. No effusions. Heart is normal size. Hepatobiliary: No focal hepatic abnormality. Gallbladder unremarkable. Pancreas: No focal abnormality or ductal dilatation. Spleen: No focal abnormality.  Normal size. Adrenals/Urinary Tract: Small nodules within the right adrenal gland measures 1.6 cm, likely adenoma. No renal or ureteral stones. No hydronephrosis. Urinary bladder is unremarkable. Stomach/Bowel: Stomach, large and small bowel grossly unremarkable. Vascular/Lymphatic: No evidence of aneurysm or adenopathy. Reproductive: Low-density area in the right adnexal region measures 5.1 cm, likely right ovarian cyst. IUD noted in the uterus. Other: No free fluid or free air.  Musculoskeletal: No acute bony abnormality. IMPRESSION: No renal or ureteral stones.  No hydronephrosis. 5.1 cm low-density lesion in the right adnexa, likely right ovarian cyst. Stable right adrenal nodule, likely adenoma. Electronically Signed   By: Rolm Baptise M.D.   On: 02/23/2019 03:57   US Transvaginal Non-ob  Result Date: 02/25/2019 CLINICAL DATA:  RIGHT adnexal pain.  Prior Caesarean section. EXAM: TRANSABDOMINAL ULTRASOUND OF PELVIS DOPPLER ULTRASOUND OF OVARIES TECHNIQUE: Transabdominal ultrasound examination of the pelvis was performed including evaluation of the uterus, ovaries, adnexal regions, and pelvic cul-de-sac. Color and duplex Doppler ultrasound was utilized to evaluate blood flow to the ovaries. COMPARISON:  Pelvic ultrasound 2 days prior (02/23/2019 FINDINGS: Uterus Measurements: Normal in size and echogenicity measuring 8.7 x 4.3 x 4.7 cm. = volume: 87 mL. No fibroids or other mass visualized. Endometrium Thickness: Normal for premenopausal female at 12 mm. IUD noted. No focal abnormality visualized. Right ovary Measurements: 6.0 x 4.9 x 5.6 cm = volume: 86 mL. Large mildly anechoic cystic structure within the RIGHT ovary measures 5.7 x 5.2 x 4.9 cm. There are a fine internal echoes within the is cystic lesion. Mild nodularity along 1 of the cyst walls with 3 mm cyst wall nodules. There is no internal enhancement or nodularity identified. No significant vascularity. Left ovary Measurements: 2.7 x 2.3 x 2.1 cm = volume: 6.7 mL. Normal appearance/no adnexal mass. Pulsed Doppler evaluation demonstrates normal low-resistance arterial and venous waveforms in both ovaries. Other: Trace free fluid IMPRESSION: 1.  No evidence of ovarian torsion. 2. Large cystic lesion within the RIGHT kidney likely represents a hemorrhagic ovarian cyst. With small nodules along cyst wall, cystic ovarian neoplasm warrants exclusion. Due to size of lesion, recommend follow-up ultrasound in 6-12 weeks. This  recommendation follows the consensus statement: Management of Asymptomatic Ovarian and Other Adnexal Cysts Imaged at [place]: Society of Radiologists in [place]. Radiology 2010; [id number]. 3. Normal LEFT ovary. 4. Normal uterus with IUD noted. Electronically Signed   By: Suzy Bouchard M.D.   On: 02/25/2019 16:29   US Transvaginal Non-ob  Result Date: 02/23/2019 CLINICAL DATA:  Initial evaluation for acute right lower quadrant pain. EXAM: TRANSABDOMINAL AND TRANSVAGINAL ULTRASOUND OF PELVIS DOPPLER ULTRASOUND OF OVARIES TECHNIQUE: Both transabdominal and transvaginal ultrasound examinations of the pelvis were performed. Transabdominal technique was performed for global imaging of the pelvis including uterus, ovaries, adnexal regions, and pelvic cul-de-sac. It was necessary to proceed with endovaginal exam following the transabdominal exam to visualize the uterus, endometrium, and ovaries. Color and duplex Doppler ultrasound was utilized to evaluate blood flow to the ovaries. COMPARISON:  Prior CT from earlier same day. FINDINGS: Uterus Measurements: 8.7 x 3.7 x 3.3 cm = volume: 57.3 mL. No fibroids or other mass visualized. Endometrium IUD in place within the endometrial cavity. Adjacent endometrial stripe not well evaluated. Right ovary Measurements: 6.1 x 4.4 x 4.7 cm = volume: 67.5 mL. 4.9 cm mildly complex right ovarian cyst. Scattered low-level internal echoes which could reflect hemorrhage or debris. No internal solid component or vascularity. Left ovary Measurements: 2.2 x 1.9 x 2.3 cm = volume: 5.4 mL. Normal appearance/no adnexal mass. Pulsed Doppler evaluation of both ovaries demonstrates normal low-resistance arterial and venous waveforms. Other findings No abnormal free fluid. IMPRESSION: 1. 4.9 cm mildly complex right ovarian cyst, most likely a hemorrhagic cyst or possibly physiologic follicular cyst with internal proteinaceous material/debris. Short interval  follow-up ultrasound in 6-12 weeks to ensure resolution is suggested as clinically warranted. 2. No evidence for ovarian torsion or other acute finding. 3. IUD in appropriate position within the endometrial cavity. Electronically Signed   By: Jeannine Boga M.D.   On: 02/23/2019 06:05   US Pelvis Complete  Result Date: 02/23/2019 CLINICAL DATA:  Initial evaluation for acute right lower quadrant pain. EXAM: TRANSABDOMINAL AND TRANSVAGINAL ULTRASOUND OF PELVIS DOPPLER ULTRASOUND OF OVARIES TECHNIQUE: Both transabdominal and transvaginal ultrasound examinations of the pelvis were performed. Transabdominal technique was performed for global imaging of the pelvis including uterus, ovaries, adnexal regions, and pelvic cul-de-sac. It was necessary to proceed with endovaginal exam following the transabdominal exam to visualize the uterus, endometrium, and ovaries. Color and duplex Doppler ultrasound was utilized to evaluate blood flow to the ovaries. COMPARISON:  Prior CT from earlier same day. FINDINGS: Uterus Measurements: 8.7 x 3.7 x 3.3 cm = volume: 57.3 mL. No fibroids or other mass visualized. Endometrium IUD in place within the endometrial cavity. Adjacent endometrial stripe not well evaluated. Right ovary Measurements: 6.1 x 4.4 x 4.7 cm = volume: 67.5 mL. 4.9 cm mildly complex right ovarian cyst. Scattered low-level internal echoes which could reflect hemorrhage or debris. No internal solid component or vascularity. Left ovary Measurements: 2.2 x 1.9 x 2.3 cm = volume: 5.4 mL. Normal appearance/no adnexal mass. Pulsed Doppler evaluation of both ovaries demonstrates normal low-resistance arterial and venous waveforms. Other findings No abnormal free fluid. IMPRESSION: 1. 4.9 cm mildly complex right ovarian cyst, most likely a hemorrhagic cyst or possibly physiologic follicular cyst with  internal proteinaceous material/debris. Short interval follow-up ultrasound in 6-12 weeks to ensure resolution is  suggested as clinically warranted. 2. No evidence for ovarian torsion or other acute finding. 3. IUD in appropriate position within the endometrial cavity. Electronically Signed   By: Jeannine Boga M.D.   On: 02/23/2019 06:05   [place] Art/ven Flow Abd Pelv Doppler  Result Date: 02/25/2019 CLINICAL DATA:  RIGHT adnexal pain.  Prior Caesarean section. EXAM: TRANSABDOMINAL ULTRASOUND OF PELVIS DOPPLER ULTRASOUND OF OVARIES TECHNIQUE: Transabdominal ultrasound examination of the pelvis was performed including evaluation of the uterus, ovaries, adnexal regions, and pelvic cul-de-sac. Color and duplex Doppler ultrasound was utilized to evaluate blood flow to the ovaries. COMPARISON:  Pelvic ultrasound 2 days prior (02/23/2019 FINDINGS: Uterus Measurements: Normal in size and echogenicity measuring 8.7 x 4.3 x 4.7 cm. = volume: 87 mL. No fibroids or other mass visualized. Endometrium Thickness: Normal for premenopausal female at 12 mm. IUD noted. No focal abnormality visualized. Right ovary Measurements: 6.0 x 4.9 x 5.6 cm = volume: 86 mL. Large mildly anechoic cystic structure within the RIGHT ovary measures 5.7 x 5.2 x 4.9 cm. There are a fine internal echoes within the is cystic lesion. Mild nodularity along 1 of the cyst walls with 3 mm cyst wall nodules. There is no internal enhancement or nodularity identified. No significant vascularity. Left ovary Measurements: 2.7 x 2.3 x 2.1 cm = volume: 6.7 mL. Normal appearance/no adnexal mass. Pulsed Doppler evaluation demonstrates normal low-resistance arterial and venous waveforms in both ovaries. Other: Trace free fluid IMPRESSION: 1. No evidence of ovarian torsion. 2. Large cystic lesion within the RIGHT kidney likely represents a hemorrhagic ovarian cyst. With small nodules along cyst wall, cystic ovarian neoplasm warrants exclusion. Due to size of lesion, recommend follow-up ultrasound in 6-12 weeks. This recommendation follows the consensus statement: Management  of Asymptomatic Ovarian and Other Adnexal Cysts Imaged at [place]: Society of Radiologists in [place]. Radiology 2010; [phone]. 3. Normal LEFT ovary. 4. Normal uterus with IUD noted. Electronically Signed   By: Suzy Bouchard M.D.   On: 02/25/2019 16:29   [place] Art/ven Flow Abd Pelv Doppler  Result Date: 02/23/2019 CLINICAL DATA:  Initial evaluation for acute right lower quadrant pain. EXAM: TRANSABDOMINAL AND TRANSVAGINAL ULTRASOUND OF PELVIS DOPPLER ULTRASOUND OF OVARIES TECHNIQUE: Both transabdominal and transvaginal ultrasound examinations of the pelvis were performed. Transabdominal technique was performed for global imaging of the pelvis including uterus, ovaries, adnexal regions, and pelvic cul-de-sac. It was necessary to proceed with endovaginal exam following the transabdominal exam to visualize the uterus, endometrium, and ovaries. Color and duplex Doppler ultrasound was utilized to evaluate blood flow to the ovaries. COMPARISON:  Prior CT from earlier same day. FINDINGS: Uterus Measurements: 8.7 x 3.7 x 3.3 cm = volume: 57.3 mL. No fibroids or other mass visualized. Endometrium IUD in place within the endometrial cavity. Adjacent endometrial stripe not well evaluated. Right ovary Measurements: 6.1 x 4.4 x 4.7 cm = volume: 67.5 mL. 4.9 cm mildly complex right ovarian cyst. Scattered low-level internal echoes which could reflect hemorrhage or debris. No internal solid component or vascularity. Left ovary Measurements: 2.2 x 1.9 x 2.3 cm = volume: 5.4 mL. Normal appearance/no adnexal mass. Pulsed Doppler evaluation of both ovaries demonstrates normal low-resistance arterial and venous waveforms. Other findings No abnormal free fluid. IMPRESSION: 1. 4.9 cm mildly complex right ovarian cyst, most likely a hemorrhagic cyst or possibly physiologic follicular cyst with internal proteinaceous material/debris. Short interval follow-up ultrasound in 6-12 weeks  to ensure resolution  is suggested as clinically warranted. 2. No evidence for ovarian torsion or other acute finding. 3. IUD in appropriate position within the endometrial cavity. Electronically Signed   By: Jeannine Boga M.D.   On: 02/23/2019 06:05      Assessment and Plan:    1. Vulvar abscess Resolved, no issues  2. Complex right ovarian cyst Imaging results from 02/25/19 reviewed with patient. Concerned about possible torsion. She was sent for ultrasound today, will follow up results and manage accordingly.  Advised to be NPO for now.  If surgery indicated, will send to [facility] and alert GYN on call. If no surgery indicated, will prescribe pain medications and continue to given torsion precautions, also make plans for surveillance of cyst.  CA-125 level also drawn given complex characteristics, will follow up results and manage accordingly. - CA 125 - US PELVIS TRANSVANGINAL NON-OB (TV ONLY); Future - US PELVIC DOPPLER (TORSION R/O OR MASS ARTERIAL FLOW); Future   Return for any gynecologic concerns.    Total face-to-face time with patient: 25 minutes.  Over 50% of encounter was spent on counseling and coordination of care.   Verita Schneiders, MD, [place] for [employer], [place]

## 2019-03-14 NOTE — Patient Instructions (Signed)
Ovarian Cyst         An ovarian cyst is a fluid-filled sac that forms on an ovary. The ovaries are small organs that produce eggs in women. Various types of cysts can form on the ovaries. Some may cause symptoms and require treatment. Most ovarian cysts go away on their own, are not cancerous (are benign), and do not cause problems.  Common types of ovarian cysts include:  · Functional (follicle) cysts.  ? Occur during the menstrual cycle, and usually go away with the next menstrual cycle if you do not get pregnant.  ? Usually cause no symptoms.  · Endometriomas.  ? Are cysts that form from the tissue that lines the uterus (endometrium).  ? Are sometimes called “chocolate cysts” because they become filled with blood that turns brown.  ? Can cause pain in the lower abdomen during intercourse and during your period.  · Cystadenoma cysts.  ? Develop from cells on the outside surface of the ovary.  ? Can get very large and cause lower abdomen pain and pain with intercourse.  ? Can cause severe pain if they twist or break open (rupture).  · Dermoid cysts.  ? Are sometimes found in both ovaries.  ? May contain different kinds of body tissue, such as skin, teeth, hair, or cartilage.  ? Usually do not cause symptoms unless they get very big.  · Theca lutein cysts.  ? Occur when too much of a certain hormone (human chorionic gonadotropin) is produced and overstimulates the ovaries to produce an egg.  ? Are most common after having procedures used to assist with the conception of a baby (in vitro fertilization).  What are the causes?  Ovarian cysts may be caused by:  · Ovarian hyperstimulation syndrome. This is a condition that can develop from taking fertility medicines. It causes multiple large ovarian cysts to form.  · Polycystic ovarian syndrome (PCOS). This is a common hormonal disorder that can cause ovarian cysts, as well as problems with your period or fertility.  What increases the risk?  The following factors may  make you more likely to develop ovarian cysts:  · Being overweight or obese.  · Taking fertility medicines.  · Taking certain forms of hormonal birth control.  · Smoking.  What are the signs or symptoms?  Many ovarian cysts do not cause symptoms. If symptoms are present, they may include:  · Pelvic pain or pressure.  · Pain in the lower abdomen.  · Pain during sex.  · Abdominal swelling.  · Abnormal menstrual periods.  · Increasing pain with menstrual periods.  How is this diagnosed?  These cysts are commonly found during a routine pelvic exam. You may have tests to find out more about the cyst, such as:  · Ultrasound.  · X-ray of the pelvis.  · CT scan.  · MRI.  · Blood tests.  How is this treated?  Many ovarian cysts go away on their own without treatment. Your health care provider may want to check your cyst regularly for 2-3 months to see if it changes. If you are in menopause, it is especially important to have your cyst monitored closely because menopausal women have a higher rate of ovarian cancer.  When treatment is needed, it may include:  · Medicines to help relieve pain.  · A procedure to drain the cyst (aspiration).  · Surgery to remove the whole cyst.  · Hormone treatment or birth control pills. These methods are sometimes used   to help dissolve a cyst.  Follow these instructions at home:  · Take over-the-counter and prescription medicines only as told by your health care provider.  · Do not drive or use heavy machinery while taking prescription pain medicine.  · Get regular pelvic exams and Pap tests as often as told by your health care provider.  · Return to your normal activities as told by your health care provider. Ask your health care provider what activities are safe for you.  · Do not use any products that contain nicotine or tobacco, such as cigarettes and e-cigarettes. If you need help quitting, ask your health care provider.  · Keep all follow-up visits as told by your health care provider.  This is important.  Contact a health care provider if:  · Your periods are late, irregular, or painful, or they stop.  · You have pelvic pain that does not go away.  · You have pressure on your bladder or trouble emptying your bladder completely.  · You have pain during sex.  · You have any of the following in your abdomen:  ? A feeling of fullness.  ? Pressure.  ? Discomfort.  ? Pain that does not go away.  ? Swelling.  · You feel generally ill.  · You become constipated.  · You lose your appetite.  · You develop severe acne.  · You start to have more body hair and facial hair.  · You are gaining weight or losing weight without changing your exercise and eating habits.  · You think you may be pregnant.  Get help right away if:  · You have abdominal pain that is severe or gets worse.  · You cannot eat or drink without vomiting.  · You suddenly develop a fever.  · Your menstrual period is much heavier than usual.  This information is not intended to replace advice given to you by your health care provider. Make sure you discuss any questions you have with your health care provider.  Document Released: 10/09/2005 Document Revised: 04/28/2016 Document Reviewed: 03/12/2016  Elsevier Interactive Patient Education © 2019 [employer]

## 2019-03-14 NOTE — Addendum Note (Signed)
Addended by: Verita Schneiders A on: 03/14/2019 11:31 AM   Modules accepted: Orders

## 2019-03-14 NOTE — Progress Notes (Signed)
Imaging Addendum US Pelvis Transvanginal Non-ob (tv Only)  Result Date: 03/14/2019 CLINICAL DATA:  Complex ovarian cysts.  Right sided pain. EXAM: TRANSABDOMINAL ULTRASOUND OF PELVIS DOPPLER ULTRASOUND OF OVARIES TECHNIQUE: Transabdominal ultrasound examination of the pelvis was performed including evaluation of the uterus, ovaries, adnexal regions, and pelvic cul-de-sac. Color and duplex Doppler ultrasound was utilized to evaluate blood flow to the ovaries. COMPARISON:  02/25/2019 FINDINGS: Uterus Measurements: 9.1 x 4.5 x 4.5 cm. = volume: 95.6 mL. There are 2 small fibroids identified within the left anterior myometrium. The largest measures 0.8 x 0.9 x 0.8 cm. Endometrium Thickness: 5.6 mm.  No focal abnormality visualized. Right ovary Measurements: 6.6 x 5.0 x 5.4 cm. = volume: 94.2 mL. Two adjacent cysts versus 1 large complex cyst is noted within the right ovary. -complex cyst containing diffuse low level internal echoes and a single thin internal area of septation measures 6.2 by 4.7 x 4.4 cm. On the previous exam this measured 6.2 x 4.7 x 4.4 cm cm. On the previous exam this measured 5.7 x 5.2 by 4.9 cm. -adjacent anechoic cyst versus measures 2.5 x 1.9 x 1.8 cm. Left ovary Measurements: 2.6 x 1.2 x 2.7 cm = volume: 4.4 ML. Slightly complex cyst within the left ovary containing low-level internal echoes is favored to represent a corpus luteum. This measures 1.5 x 1.5 x 1.5 cm. New from previous exam. Pulsed Doppler evaluation demonstrates normal low-resistance arterial and venous waveforms in both ovaries. Other: No free fluid. IMPRESSION: 1. No evidence for ovarian torsion. 2. Interval increase in volume of right ovary compared with 02/25/2019. Persistent complex cystic mass within the left ovary is identified which appears increased in size from previous exam. This is new when compared with 10/28/2018. Primary differential considerations at this point include complex hemorrhagic cyst versus  endometrioma versus benign or malignant ovarian neoplasm. An additional Short-interval follow up ultrasound in 6-12 weeks may be considered, preferably during the week following the patient's normal menses. If persistent/progressive been surgical consultation may be considered Alternatively, further evaluation with contrast enhanced MRI of the pelvis may provide a more detailed assessment of this persistent and enlarging abnormality. Electronically Signed   By: Kerby Moors M.D.   On: 03/14/2019 11:13   US Pelvic Doppler (torsion R/o Or Mass Arterial Flow)  Result Date: 03/14/2019 CLINICAL DATA:  Complex ovarian cysts.  Right sided pain. EXAM: TRANSABDOMINAL ULTRASOUND OF PELVIS DOPPLER ULTRASOUND OF OVARIES TECHNIQUE: Transabdominal ultrasound examination of the pelvis was performed including evaluation of the uterus, ovaries, adnexal regions, and pelvic cul-de-sac. Color and duplex Doppler ultrasound was utilized to evaluate blood flow to the ovaries. COMPARISON:  02/25/2019 FINDINGS: Uterus Measurements: 9.1 x 4.5 x 4.5 cm. = volume: 95.6 mL. There are 2 small fibroids identified within the left anterior myometrium. The largest measures 0.8 x 0.9 x 0.8 cm. Endometrium Thickness: 5.6 mm.  No focal abnormality visualized. Right ovary Measurements: 6.6 x 5.0 x 5.4 cm. = volume: 94.2 mL. Two adjacent cysts versus 1 large complex cyst is noted within the right ovary. -complex cyst containing diffuse low level internal echoes and a single thin internal area of septation measures 6.2 by 4.7 x 4.4 cm. On the previous exam this measured 6.2 x 4.7 x 4.4 cm cm. On the previous exam this measured 5.7 x 5.2 by 4.9 cm. -adjacent anechoic cyst versus measures 2.5 x 1.9 x 1.8 cm. Left ovary Measurements: 2.6 x 1.2 x 2.7 cm = volume: 4.4 ML. Slightly complex cyst within the  left ovary containing low-level internal echoes is favored to represent a corpus luteum. This measures 1.5 x 1.5 x 1.5 cm. New from previous exam.  Pulsed Doppler evaluation demonstrates normal low-resistance arterial and venous waveforms in both ovaries. Other: No free fluid. IMPRESSION: 1. No evidence for ovarian torsion. 2. Interval increase in volume of right ovary compared with 02/25/2019. Persistent complex cystic mass within the left ovary is identified which appears increased in size from previous exam. This is new when compared with 10/28/2018. Primary differential considerations at this point include complex hemorrhagic cyst versus endometrioma versus benign or malignant ovarian neoplasm. An additional Short-interval follow up ultrasound in 6-12 weeks may be considered, preferably during the week following the patient's normal menses. If persistent/progressive been surgical consultation may be considered Alternatively, further evaluation with contrast enhanced MRI of the pelvis may provide a more detailed assessment of this persistent and enlarging abnormality. Electronically Signed   By: Kerby Moors M.D.   On: 03/14/2019 11:13   Results reviewed with patient, no signs of torsion. New small physiologic left ovarian cyst.  Tramadol prescribed for pain, pain and torsion precautions reviewed with patient. Will follow up as needed.   Verita Schneiders, MD, [place] for [employer], [place]

## 2019-03-15 LAB — CA 125: Cancer Antigen (CA) 125: 16.5 U/mL (ref 0.0–38.1)

## 2019-04-08 ENCOUNTER — Other Ambulatory Visit: Payer: Self-pay

## 2019-04-08 ENCOUNTER — Emergency Department (HOSPITAL_COMMUNITY)
Admission: EM | Admit: 2019-04-08 | Discharge: 2019-04-08 | Disposition: A | Discharge status: Home / Self Care | Payer: Medicaid Other | Attending: Emergency Medicine | Admitting: Emergency Medicine

## 2019-04-08 ENCOUNTER — Encounter (HOSPITAL_COMMUNITY): Payer: Self-pay | Admitting: Emergency Medicine

## 2019-04-08 DIAGNOSIS — Z86711 Personal history of pulmonary embolism: Secondary | ICD-10-CM | POA: Insufficient documentation

## 2019-04-08 DIAGNOSIS — R631 Polydipsia: Secondary | ICD-10-CM | POA: Diagnosis present

## 2019-04-08 DIAGNOSIS — F17210 Nicotine dependence, cigarettes, uncomplicated: Secondary | ICD-10-CM | POA: Diagnosis not present

## 2019-04-08 DIAGNOSIS — E1165 Type 2 diabetes mellitus with hyperglycemia: Secondary | ICD-10-CM | POA: Insufficient documentation

## 2019-04-08 DIAGNOSIS — Z794 Long term (current) use of insulin: Secondary | ICD-10-CM | POA: Diagnosis not present

## 2019-04-08 DIAGNOSIS — R1030 Lower abdominal pain, unspecified: Secondary | ICD-10-CM | POA: Diagnosis not present

## 2019-04-08 DIAGNOSIS — R739 Hyperglycemia, unspecified: Secondary | ICD-10-CM

## 2019-04-08 DIAGNOSIS — Z79899 Other long term (current) drug therapy: Secondary | ICD-10-CM | POA: Insufficient documentation

## 2019-04-08 LAB — CBC
HCT: 44.3 % (ref 36.0–46.0)
Hemoglobin: 13.6 g/dL (ref 12.0–15.0)
MCH: 27.9 pg (ref 26.0–34.0)
MCHC: 30.7 g/dL (ref 30.0–36.0)
MCV: 91 fL (ref 80.0–100.0)
Platelets: 328 10*3/uL (ref 150–400)
RBC: 4.87 MIL/uL (ref 3.87–5.11)
RDW: 13.3 % (ref 11.5–15.5)
WBC: 6.6 10*3/uL (ref 4.0–10.5)
nRBC: 0 % (ref 0.0–0.2)

## 2019-04-08 LAB — COMPREHENSIVE METABOLIC PANEL
ALT: 20 U/L (ref 0–44)
AST: 19 U/L (ref 15–41)
Albumin: 4.2 g/dL (ref 3.5–5.0)
Alkaline Phosphatase: 89 U/L (ref 38–126)
Anion gap: 17 — ABNORMAL HIGH (ref 5–15)
BUN: 10 mg/dL (ref 6–20)
CO2: 23 mmol/L (ref 22–32)
Calcium: 9.2 mg/dL (ref 8.9–10.3)
Chloride: 85 mmol/L — ABNORMAL LOW (ref 98–111)
Creatinine, Ser: 0.97 mg/dL (ref 0.44–1.00)
GFR calc Af Amer: >60 mL/min (ref >60)
GFR calc non Af Amer: >60 mL/min (ref >60)
Glucose, Bld: 981 mg/dL (ref 70–99)
Potassium: 4 mmol/L (ref 3.5–5.1)
Sodium: 125 mmol/L — ABNORMAL LOW (ref 135–145)
Total Bilirubin: 0.7 mg/dL (ref 0.3–1.2)
Total Protein: 8 g/dL (ref 6.5–8.1)

## 2019-04-08 LAB — URINALYSIS, ROUTINE W REFLEX MICROSCOPIC
Bacteria, UA: NONE SEEN
Bilirubin Urine: NEGATIVE
Glucose, UA: >=500 mg/dL — AB
Hgb urine dipstick: NEGATIVE
Ketones, ur: NEGATIVE mg/dL
Leukocytes,Ua: NEGATIVE
Nitrite: NEGATIVE
Protein, ur: NEGATIVE mg/dL
Specific Gravity, Urine: 1.026 (ref 1.005–1.030)
pH: 6 (ref 5.0–8.0)

## 2019-04-08 LAB — I-STAT BETA HCG BLOOD, ED (MC, WL, AP ONLY): I-stat hCG, quantitative: <5 m[IU]/mL (ref <5)

## 2019-04-08 LAB — CBG MONITORING, ED
Glucose-Capillary: >600 mg/dL (ref 70–99)
Glucose-Capillary: 593 mg/dL (ref 70–99)

## 2019-04-08 LAB — LIPASE, BLOOD: Lipase: 51 U/L (ref 11–51)

## 2019-04-08 MED ORDER — INSULIN ASPART 100 UNIT/ML ~~LOC~~ SOLN
15.0000 [IU] | Freq: Once | SUBCUTANEOUS | Status: AC
  Administered 2019-04-08: 15 [IU] via SUBCUTANEOUS
  Filled 2019-04-08: qty 1

## 2019-04-08 MED ORDER — SODIUM CHLORIDE 0.9 % IV BOLUS
2000.0000 mL | Freq: Once | INTRAVENOUS | Status: AC
  Administered 2019-04-08: 2000 mL via INTRAVENOUS

## 2019-04-08 MED ORDER — OXYCODONE HCL 5 MG PO TABS
5.0000 mg | ORAL_TABLET | Freq: Once | ORAL | Status: AC
  Administered 2019-04-08: 5 mg via ORAL
  Filled 2019-04-08: qty 1

## 2019-04-08 MED ORDER — INSULIN ASPART 100 UNIT/ML ~~LOC~~ SOLN
20.0000 [IU] | Freq: Once | SUBCUTANEOUS | Status: AC
  Administered 2019-04-08: 20 [IU] via SUBCUTANEOUS
  Filled 2019-04-08: qty 1

## 2019-04-08 MED ORDER — ACETAMINOPHEN 500 MG PO TABS
1000.0000 mg | ORAL_TABLET | Freq: Once | ORAL | Status: AC
  Administered 2019-04-08: 1000 mg via ORAL
  Filled 2019-04-08: qty 2

## 2019-04-08 NOTE — ED Provider Notes (Addendum)
[facility] DEPT Provider Note   CSN: [id number] Arrival date & time: 04/08/19  1156    History   Chief Complaint Chief Complaint  Patient presents with  . Flank Pain  . Abdominal Pain  . Hyperglycemia    HPI Jodi Mcgee is a 33 y.o. female.     33 yo F with a chief complaints of polydipsia polyuria polyphagia.  Going on for the past week.  She has been having bilateral lower abdominal pain that she attributes to bilateral ovarian cysts.  She has been seen by an OB/GYN and there are plans to remove this at some point once that is a possibility during the current pandemic.  She denies significant worsening of her symptoms denies vaginal bleeding or discharge denies vomiting or diarrhea.  She denies fevers or chills.  States that she has been compliant with her medications.  I personally saw the patient a couple weeks ago and she had a abscess to the vulvar region.  She states that this is resolved.  The history is provided by the patient.  Flank Pain This is a new problem. The current episode started yesterday. The problem occurs constantly. The problem has been gradually worsening. Associated symptoms include abdominal pain. Pertinent negatives include no chest pain, no headaches and no shortness of breath. Nothing aggravates the symptoms. Nothing relieves the symptoms. She has tried nothing for the symptoms. The treatment provided no relief.  Abdominal Pain Associated symptoms: no chest pain, no chills, no dysuria, no fever, no nausea, no shortness of breath and no vomiting   Hyperglycemia Associated symptoms: abdominal pain   Associated symptoms: no chest pain, no dizziness, no dysuria, no fever, no nausea, no shortness of breath and no vomiting     Past Medical History:  Diagnosis Date  . Anemia   . Anxiety   . Blood transfusion without reported diagnosis    both CS  . Diabetes mellitus without complication ([place])   . GERD  (gastroesophageal reflux disease)    has resolved  . IBS (irritable bowel syndrome)   . Ovarian cyst   . Peritonitis, acute generalized ([place])   . Pulmonary embolism ([place])   . Sepsis [facility])     Patient Active Problem List   Diagnosis Date Noted  . Bartholin's gland abscess 02/21/2019  . DKA (diabetic ketoacidoses) ([place]) 02/20/2019  . Blood per rectum 02/20/2019  . Hyponatremia 09/26/2018  . Chronic hypertension 01/03/2018  . IUD contraception 01/03/2018  . Pulmonary embolism during puerperium 12/04/2017  . Pulmonary edema 11/30/2017  . Pelvic adhesive disease 10/22/2017  . Nasal septal perforation 10/17/2017  . History of cocaine abuse ([place]) 10/17/2017  . Epistaxis 10/17/2017  . 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 Obesity (BMI 30-39.9) 09/25/2017  . Type 2 diabetes mellitus, without long-term current use of insulin ([place]) 08/23/2017  . Chronic pain syndrome 08/23/2017    Past Surgical History:  Procedure Laterality Date  . APPENDECTOMY     ruptured   . BUNIONECTOMY    . CESAREAN SECTION    . CESAREAN SECTION Bilateral 11/20/2017   Procedure: CESAREAN SECTION;  Surgeon: Sloan Leiter, MD;  Location: [facility];  Service: Obstetrics;  Laterality: Bilateral;  . OVARIAN CYST DRAINAGE    . SMALL BOWEL REPAIR       OB History    Gravida  3   Para  3   Term  1   Preterm  2   AB  0  Living  3     SAB  0   TAB  0   Ectopic  0   Multiple  0   Live Births  3            Home Medications    Prior to Admission medications   Medication Sig Start Date End Date Taking? Authorizing Provider  glipiZIDE (GLUCOTROL) 5 MG tablet Take 1 tablet (5 mg total) by mouth 2 (two) times daily before a meal. 09/27/18  Yes Charlynne Cousins, MD  insulin detemir (LEVEMIR) 100 unit/ml SOLN Inject 0.2 mLs (20 Units total) into the skin 2 (two) times daily. Patient taking differently: Inject 20 Units into the skin 3 (three) times daily.  01/08/19  Yes Little,  Wenda Overland, MD  Levonorgestrel (LILETTA) 19.5 MCG/DAY IUD IUD 1 each by Intrauterine route continuous. Placed in 2019   Yes [provider]  metFORMIN (GLUCOPHAGE) 1000 MG tablet Take 1 tablet (1,000 mg total) by mouth 2 (two) times daily with a meal. 09/12/18  Yes Recardo Evangelist, PA-C  blood glucose meter kit and supplies KIT Dispense based on patient and insurance preference. Use up to four times daily as directed. (FOR ICD-9 250.00, 250.01). 02/21/19   Oretha Milch D, MD  cephALEXin (KEFLEX) 500 MG capsule Take 1 capsule (500 mg total) by mouth every 12 (twelve) hours. Patient not taking: Reported on 03/14/2019 02/21/19   Oretha Milch D, MD  doxycycline (VIBRAMYCIN) 100 MG capsule Take 1 capsule (100 mg total) by mouth 2 (two) times daily. Patient not taking: Reported on 03/14/2019 02/25/19   Deno Etienne, DO  ondansetron (ZOFRAN ODT) 4 MG disintegrating tablet Take 1 tablet (4 mg total) by mouth every 8 (eight) hours as needed. Patient not taking: Reported on 04/08/2019 02/23/19   McDonald, Maree Erie A, PA-C  oxyCODONE-acetaminophen (PERCOCET/ROXICET) 5-325 MG tablet Take 1 tablet by mouth every 4 (four) hours as needed for severe pain. Patient not taking: Reported on 03/14/2019 02/23/19   McDonald, Maree Erie A, PA-C  potassium chloride SA (K-DUR) 20 MEQ tablet Take 1 tablet (20 mEq total) by mouth 2 (two) times daily for 5 days. Patient not taking: Reported on 04/08/2019 02/23/19 02/28/19  McDonald, Mia A, PA-C  traMADol (ULTRAM) 50 MG tablet Take 2 tablets (100 mg total) by mouth every 6 (six) hours as needed for severe pain. Patient not taking: Reported on 04/08/2019 03/14/19   Osborne Oman, MD    Family History Family History  Problem Relation Age of Onset  . Hypertension Mother   . Anemia Mother   . Diabetes Father     Social History Social History   Tobacco Use  . Smoking status: Light Tobacco Smoker    Packs/day: 0.50    Types: Cigarettes  . Smokeless tobacco: Never Used  . Tobacco  comment: social use with tobacco  Substance Use Topics  . Alcohol use: Yes    Comment: social  . Drug use: Not Currently     Allergies   Cetirizine & related, Toradol [ketorolac tromethamine], Flagyl [metronidazole], Contrast media [iodinated diagnostic agents], and Nsaids   Review of Systems Review of Systems  Constitutional: Negative for chills and fever.  HENT: Negative for congestion and rhinorrhea.   Eyes: Negative for redness and visual disturbance.  Respiratory: Negative for shortness of breath and wheezing.   Cardiovascular: Negative for chest pain and palpitations.  Gastrointestinal: Positive for abdominal pain. Negative for nausea and vomiting.  Genitourinary: Positive for flank pain. Negative for dysuria and  urgency.  Musculoskeletal: Negative for arthralgias and myalgias.  Skin: Negative for pallor and wound.  Neurological: Negative for dizziness and headaches.     Physical Exam Updated Vital Signs BP 96/65   Pulse 76   Temp 98.6 F (37 C) (Oral)   Resp 16   SpO2 97%   Physical Exam Vitals signs and nursing note reviewed.  Constitutional:      General: She is not in acute distress.    Appearance: She is well-developed. She is not diaphoretic.  HENT:     Head: Normocephalic and atraumatic.  Eyes:     Pupils: Pupils are equal, round, and reactive to light.  Neck:     Musculoskeletal: Normal range of motion and neck supple.  Cardiovascular:     Rate and Rhythm: Normal rate and regular rhythm.     Heart sounds: No murmur. No friction rub. No gallop.   Pulmonary:     Effort: Pulmonary effort is normal.     Breath sounds: No wheezing or rales.  Abdominal:     General: There is no distension.     Palpations: Abdomen is soft.     Tenderness: There is no abdominal tenderness.     Comments: No appreciable abdominal tenderness on exam.  Musculoskeletal:        General: No tenderness.  Skin:    General: Skin is warm and dry.  Neurological:     Mental  Status: She is alert and oriented to person, place, and time.  Psychiatric:        Behavior: Behavior normal.      ED Treatments / Results  Labs (all labs ordered are listed, but only abnormal results are displayed) Labs Reviewed  COMPREHENSIVE METABOLIC PANEL - Abnormal; Notable for the following components:      Result Value   Sodium 125 (*)    Chloride 85 (*)    Glucose, Bld 981 (*)    Anion gap 17 (*)    All other components within normal limits  URINALYSIS, ROUTINE W REFLEX MICROSCOPIC - Abnormal; Notable for the following components:   Color, Urine COLORLESS (*)    Glucose, UA >=500 (*)    All other components within normal limits  CBG MONITORING, ED - Abnormal; Notable for the following components:   Glucose-Capillary >600 (*)    All other components within normal limits  CBG MONITORING, ED - Abnormal; Notable for the following components:   Glucose-Capillary 593 (*)    All other components within normal limits  LIPASE, BLOOD  CBC  I-STAT BETA HCG BLOOD, ED ([facility], [facility], [facility] ONLY)    EKG None  Radiology No results found.  Procedures Procedures (including critical care time)  Medications Ordered in ED Medications  insulin aspart (novoLOG) injection 20 Units (has no administration in time range)  sodium chloride 0.9 % bolus 2,000 mL (2,000 mLs Intravenous New Bag/Given 04/08/19 1405)  acetaminophen (TYLENOL) tablet 1,000 mg (1,000 mg Oral Given 04/08/19 1434)  oxyCODONE (Oxy IR/ROXICODONE) immediate release tablet 5 mg (5 mg Oral Given 04/08/19 1434)  insulin aspart (novoLOG) injection 15 Units (15 Units Subcutaneous Given 04/08/19 1434)     Initial Impression / Assessment and Plan / ED Course  I have reviewed the triage vital signs and the nursing notes.  Pertinent labs & imaging results that were available during my care of the patient were reviewed by me and considered in my medical decision making (see chart for details).        [place]  yo F with a chief  complaint of hyperglycemia.  Blood sugars found to be in the 900s.  She has been having some lower abdominal tenderness.  No fevers or chills.  Urine is negative for infection.  I offered imaging of her pelvis which she is declined at this time.  Patient's blood sugar is improved significantly into the upper 500s.  I offered admission and the patient would like to get home to her son as it is his birthday.  We will have her follow-up with her endocrinologist.  CRITICAL CARE Performed by: Cecilio Asper   Total critical care time: 35 minutes  Critical care time was exclusive of separately billable procedures and treating other patients.  Critical care was necessary to treat or prevent imminent or life-threatening deterioration.  Critical care was time spent personally by me on the following activities: development of treatment plan with patient and/or surrogate as well as nursing, discussions with consultants, evaluation of patient's response to treatment, examination of patient, obtaining history from patient or surrogate, ordering and performing treatments and interventions, ordering and review of laboratory studies, ordering and review of radiographic studies, pulse oximetry and re-evaluation of patient's condition.   4:21 PM:  I have discussed the diagnosis/risks/treatment options with the patient and believe the pt to be eligible for discharge home to follow-up with PCP. We also discussed returning to the ED immediately if new or worsening sx occur. We discussed the sx which are most concerning (e.g., sudden worsening pain, fever, inability to tolerate by mouth) that necessitate immediate return. Medications administered to the patient during their visit and any new prescriptions provided to the patient are listed below.  Medications given during this visit Medications  insulin aspart (novoLOG) injection 20 Units (has no administration in time range)  sodium chloride 0.9 % bolus 2,000  mL (2,000 mLs Intravenous New Bag/Given 04/08/19 1405)  acetaminophen (TYLENOL) tablet 1,000 mg (1,000 mg Oral Given 04/08/19 1434)  oxyCODONE (Oxy IR/ROXICODONE) immediate release tablet 5 mg (5 mg Oral Given 04/08/19 1434)  insulin aspart (novoLOG) injection 15 Units (15 Units Subcutaneous Given 04/08/19 1434)     The patient appears reasonably screen and/or stabilized for discharge and I doubt any other medical condition or other [facility] requiring further screening, evaluation, or treatment in the ED at this time prior to discharge.    Final Clinical Impressions(s) / ED Diagnoses   Final diagnoses:  Hyperglycemia  Lower abdominal pain    ED Discharge Orders    None       Deno Etienne, DO 04/08/19 [place], [place], DO 04/15/19 [phone]

## 2019-04-08 NOTE — ED Triage Notes (Addendum)
pt reports that she had right flank and abd pains for over week that become more constant today. Has ovarian cyst and knows that is what causing.  hasnt checked her sugar in week "because it makes me frustrated and I would like it checked because I was sweaty while ago". Pt also requesting refill of Levimer, since she isnt able to have her phone while at work to call her PCP for refill.

## 2019-04-08 NOTE — Discharge Instructions (Signed)
Return for worsening pain, fever, inability to eat or drink 

## 2019-04-08 NOTE — ED Notes (Signed)
Date and time results received: 04/08/19 1:17 PM  (use smartphrase ".now" to insert current time)  Test: Glucose Critical Value: 981  Name of Provider Notified: Tyrone Nine  Orders Received? Or Actions Taken?: awaiting orders

## 2019-04-08 NOTE — ED Notes (Signed)
Bed: WA21 Expected date:  Expected time:  Means of arrival:  Comments: Triage 2

## 2019-05-17 ENCOUNTER — Other Ambulatory Visit: Payer: Self-pay

## 2019-05-17 ENCOUNTER — Encounter (HOSPITAL_COMMUNITY): Payer: Self-pay

## 2019-05-17 ENCOUNTER — Ambulatory Visit (HOSPITAL_COMMUNITY)
Admission: EM | Admit: 2019-05-17 | Discharge: 2019-05-17 | Disposition: A | Discharge status: Home / Self Care | Payer: Medicaid Other | Attending: Family Medicine | Admitting: Family Medicine

## 2019-05-17 ENCOUNTER — Encounter (HOSPITAL_COMMUNITY): Payer: Self-pay | Admitting: Emergency Medicine

## 2019-05-17 ENCOUNTER — Emergency Department (HOSPITAL_COMMUNITY)
Admission: EM | Admit: 2019-05-17 | Discharge: 2019-05-17 | Disposition: A | Discharge status: Home / Self Care | Payer: Medicaid Other | Attending: Emergency Medicine | Admitting: Emergency Medicine

## 2019-05-17 ENCOUNTER — Telehealth (HOSPITAL_COMMUNITY): Payer: Self-pay | Admitting: Physician Assistant

## 2019-05-17 DIAGNOSIS — I10 Essential (primary) hypertension: Secondary | ICD-10-CM | POA: Insufficient documentation

## 2019-05-17 DIAGNOSIS — E1165 Type 2 diabetes mellitus with hyperglycemia: Secondary | ICD-10-CM | POA: Insufficient documentation

## 2019-05-17 DIAGNOSIS — Z79899 Other long term (current) drug therapy: Secondary | ICD-10-CM | POA: Insufficient documentation

## 2019-05-17 DIAGNOSIS — O0932 Supervision of pregnancy with insufficient antenatal care, second trimester: Secondary | ICD-10-CM | POA: Insufficient documentation

## 2019-05-17 DIAGNOSIS — R631 Polydipsia: Secondary | ICD-10-CM | POA: Insufficient documentation

## 2019-05-17 DIAGNOSIS — Z794 Long term (current) use of insulin: Secondary | ICD-10-CM | POA: Diagnosis present

## 2019-05-17 DIAGNOSIS — Z9119 Patient's noncompliance with other medical treatment and regimen: Secondary | ICD-10-CM

## 2019-05-17 DIAGNOSIS — Z720 Tobacco use: Secondary | ICD-10-CM | POA: Diagnosis not present

## 2019-05-17 DIAGNOSIS — R350 Frequency of micturition: Secondary | ICD-10-CM | POA: Diagnosis not present

## 2019-05-17 DIAGNOSIS — R739 Hyperglycemia, unspecified: Secondary | ICD-10-CM | POA: Diagnosis not present

## 2019-05-17 DIAGNOSIS — R252 Cramp and spasm: Secondary | ICD-10-CM | POA: Diagnosis not present

## 2019-05-17 DIAGNOSIS — Z9114 Patient's other noncompliance with medication regimen: Secondary | ICD-10-CM

## 2019-05-17 LAB — CBG MONITORING, ED
Glucose-Capillary: 559 mg/dL (ref 70–99)
Glucose-Capillary: 562 mg/dL (ref 70–99)
Glucose-Capillary: 427 mg/dL — ABNORMAL HIGH (ref 70–99)
Glucose-Capillary: 330 mg/dL — ABNORMAL HIGH (ref 70–99)

## 2019-05-17 LAB — POCT URINALYSIS DIP (DEVICE)
Bilirubin Urine: NEGATIVE
Glucose, UA: 500 mg/dL — AB
Ketones, ur: 80 mg/dL — AB
Leukocytes,Ua: NEGATIVE
Nitrite: NEGATIVE
Protein, ur: 100 mg/dL — AB
Specific Gravity, Urine: 1.025 (ref 1.005–1.030)
Urobilinogen, UA: 0.2 mg/dL (ref 0.0–1.0)
pH: 5.5 (ref 5.0–8.0)

## 2019-05-17 LAB — CBC
HCT: 48.7 % — ABNORMAL HIGH (ref 36.0–46.0)
Hemoglobin: 15.9 g/dL — ABNORMAL HIGH (ref 12.0–15.0)
MCH: 28.2 pg (ref 26.0–34.0)
MCHC: 32.6 g/dL (ref 30.0–36.0)
MCV: 86.3 fL (ref 80.0–100.0)
Platelets: 444 10*3/uL — ABNORMAL HIGH (ref 150–400)
RBC: 5.64 MIL/uL — ABNORMAL HIGH (ref 3.87–5.11)
RDW: 13.3 % (ref 11.5–15.5)
WBC: 10.2 10*3/uL (ref 4.0–10.5)
nRBC: 0 % (ref 0.0–0.2)

## 2019-05-17 LAB — COMPREHENSIVE METABOLIC PANEL
ALT: 22 U/L (ref 0–44)
AST: 18 U/L (ref 15–41)
Albumin: 4.3 g/dL (ref 3.5–5.0)
Alkaline Phosphatase: 97 U/L (ref 38–126)
Anion gap: 17 — ABNORMAL HIGH (ref 5–15)
BUN: 5 mg/dL — ABNORMAL LOW (ref 6–20)
CO2: 23 mmol/L (ref 22–32)
Calcium: 10.5 mg/dL — ABNORMAL HIGH (ref 8.9–10.3)
Chloride: 94 mmol/L — ABNORMAL LOW (ref 98–111)
Creatinine, Ser: 0.82 mg/dL (ref 0.44–1.00)
GFR calc Af Amer: >60 mL/min (ref >60)
GFR calc non Af Amer: >60 mL/min (ref >60)
Glucose, Bld: 385 mg/dL — ABNORMAL HIGH (ref 70–99)
Potassium: 4.1 mmol/L (ref 3.5–5.1)
Sodium: 134 mmol/L — ABNORMAL LOW (ref 135–145)
Total Bilirubin: 1.3 mg/dL — ABNORMAL HIGH (ref 0.3–1.2)
Total Protein: 9.2 g/dL — ABNORMAL HIGH (ref 6.5–8.1)

## 2019-05-17 LAB — I-STAT BETA HCG BLOOD, ED (MC, WL, AP ONLY): I-stat hCG, quantitative: <5 m[IU]/mL (ref <5)

## 2019-05-17 LAB — GLUCOSE, CAPILLARY: Glucose-Capillary: 374 mg/dL — ABNORMAL HIGH (ref 70–99)

## 2019-05-17 MED ORDER — INSULIN ASPART 100 UNIT/ML ~~LOC~~ SOLN
10.0000 [IU] | Freq: Once | SUBCUTANEOUS | Status: AC
  Administered 2019-05-17: 16:00:00 10 [IU] via SUBCUTANEOUS

## 2019-05-17 MED ORDER — SODIUM CHLORIDE 0.9 % IV BOLUS
1000.0000 mL | Freq: Once | INTRAVENOUS | Status: AC
  Administered 2019-05-17: 16:00:00 1000 mL via INTRAVENOUS

## 2019-05-17 MED ORDER — FENTANYL CITRATE (PF) 100 MCG/2ML IJ SOLN
25.0000 ug | Freq: Once | INTRAMUSCULAR | Status: AC
  Administered 2019-05-17: 25 ug via INTRAVENOUS
  Filled 2019-05-17: qty 2

## 2019-05-17 MED ORDER — GLIPIZIDE 5 MG PO TABS: 5.0000 mg | ORAL_TABLET | Freq: Two times a day (BID) | ORAL | 0 refills | Status: DC

## 2019-05-17 MED ORDER — ACETAMINOPHEN 325 MG PO TABS
650.0000 mg | ORAL_TABLET | Freq: Once | ORAL | Status: AC
  Administered 2019-05-17: 650 mg via ORAL

## 2019-05-17 MED ORDER — ACETAMINOPHEN 325 MG PO TABS
ORAL_TABLET | ORAL | Status: AC
  Filled 2019-05-17: qty 2

## 2019-05-17 MED ORDER — INSULIN DETEMIR 100 UNIT/ML FLEXPEN: 20.0000 [IU] | Freq: Two times a day (BID) | SUBCUTANEOUS | 0 refills | Status: DC

## 2019-05-17 MED ORDER — METFORMIN HCL 1000 MG PO TABS: 1000.0000 mg | ORAL_TABLET | Freq: Two times a day (BID) | ORAL | 0 refills | Status: DC

## 2019-05-17 MED ORDER — CYCLOBENZAPRINE HCL 10 MG PO TABS: 10.0000 mg | ORAL_TABLET | Freq: Two times a day (BID) | ORAL | 0 refills | Status: DC | PRN

## 2019-05-17 MED ORDER — SODIUM CHLORIDE 0.9 % IV BOLUS
1000.0000 mL | Freq: Once | INTRAVENOUS | Status: AC
  Administered 2019-05-17: 19:00:00 1000 mL via INTRAVENOUS

## 2019-05-17 NOTE — ED Triage Notes (Signed)
Pt. Stated, Jodi Mcgee been out of my Insulin for a week and now Im having muscle spasms.

## 2019-05-17 NOTE — ED Provider Notes (Signed)
[facility] DEPARTMENT Provider Note   CSN: [id number] Arrival date & time: 05/17/19  1411    History   Chief Complaint Chief Complaint  Patient presents with  . Medication Refill    Insulin  . Spasms    HPI Jodi Mcgee is a 33 y.o. female with a past medical history of IDDM, IBS who presents to ED for hyperglycemia, muscle spasms and cramping.  States that she has been out of her insulin for the past 5 days due to unable to get an appointment with her PCP because of her work schedule.  She reports progressively worsening muscle spasms and cramps as well as high blood sugar readings.  She reports polyuria and polydipsia.  Reports similar symptoms in the past when her blood sugar has been uncontrolled.  Denies any vomiting, diarrhea, fevers, cough, shortness of breath, dysuria, abdominal pain, chest pain.     HPI  Past Medical History:  Diagnosis Date  . Anemia   . Anxiety   . Blood transfusion without reported diagnosis    both CS  . Diabetes mellitus without complication ([place])   . GERD (gastroesophageal reflux disease)    has resolved  . IBS (irritable bowel syndrome)   . Ovarian cyst   . Peritonitis, acute generalized ([place])   . Pulmonary embolism ([place])   . Sepsis [facility])     Patient Active Problem List   Diagnosis Date Noted  . Bartholin's gland abscess 02/21/2019  . DKA (diabetic ketoacidoses) ([place]) 02/20/2019  . Blood per rectum 02/20/2019  . Hyponatremia 09/26/2018  . Chronic hypertension 01/03/2018  . IUD contraception 01/03/2018  . Pulmonary embolism during puerperium 12/04/2017  . Pulmonary edema 11/30/2017  . Pelvic adhesive disease 10/22/2017  . Nasal septal perforation 10/17/2017  . History of cocaine abuse ([place]) 10/17/2017  . Epistaxis 10/17/2017  . 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 Obesity (BMI 30-39.9) 09/25/2017  . Type 2 diabetes mellitus, without long-term current use of insulin ([place]) 08/23/2017  .  Chronic pain syndrome 08/23/2017    Past Surgical History:  Procedure Laterality Date  . APPENDECTOMY     ruptured   . BUNIONECTOMY    . CESAREAN SECTION    . CESAREAN SECTION Bilateral 11/20/2017   Procedure: CESAREAN SECTION;  Surgeon: Sloan Leiter, MD;  Location: [place];  Service: Obstetrics;  Laterality: Bilateral;  . OVARIAN CYST DRAINAGE    . SMALL BOWEL REPAIR       OB History    Gravida  3   Para  3   Term  1   Preterm  2   AB  0   Living  3     SAB  0   TAB  0   Ectopic  0   Multiple  0   Live Births  3            Home Medications    Prior to Admission medications   Medication Sig Start Date End Date Taking? Authorizing Provider  blood glucose meter kit and supplies KIT Dispense based on patient and insurance preference. Use up to four times daily as directed. (FOR ICD-9 250.00, 250.01). 02/21/19   Oretha Milch D, MD  cephALEXin (KEFLEX) 500 MG capsule Take 1 capsule (500 mg total) by mouth every 12 (twelve) hours. Patient not taking: Reported on 03/14/2019 02/21/19   Oretha Milch D, MD  cyclobenzaprine (FLEXERIL) 10 MG tablet Take 1 tablet (10 mg total) by mouth 2 (  two) times daily as needed for muscle spasms. 05/17/19   Scot Jun, FNP  doxycycline (VIBRAMYCIN) 100 MG capsule Take 1 capsule (100 mg total) by mouth 2 (two) times daily. Patient not taking: Reported on 03/14/2019 02/25/19   Deno Etienne, DO  glipiZIDE (GLUCOTROL) 5 MG tablet Take 1 tablet (5 mg total) by mouth 2 (two) times daily before a meal. 05/17/19   Scot Jun, FNP  insulin detemir (LEVEMIR) 100 unit/ml SOLN Inject 0.2 mLs (20 Units total) into the skin 2 (two) times daily. 05/17/19   Scot Jun, FNP  Levonorgestrel (LILETTA) 19.5 MCG/DAY IUD IUD 1 each by Intrauterine route continuous. Placed in 2019    [provider]  metFORMIN (GLUCOPHAGE) 1000 MG tablet Take 1 tablet (1,000 mg total) by mouth 2 (two) times daily with a meal. 05/17/19    Scot Jun, FNP  ondansetron (ZOFRAN ODT) 4 MG disintegrating tablet Take 1 tablet (4 mg total) by mouth every 8 (eight) hours as needed. Patient not taking: Reported on 04/08/2019 02/23/19   McDonald, Maree Erie A, PA-C  oxyCODONE-acetaminophen (PERCOCET/ROXICET) 5-325 MG tablet Take 1 tablet by mouth every 4 (four) hours as needed for severe pain. Patient not taking: Reported on 03/14/2019 02/23/19   McDonald, Maree Erie A, PA-C  potassium chloride SA (K-DUR) 20 MEQ tablet Take 1 tablet (20 mEq total) by mouth 2 (two) times daily for 5 days. Patient not taking: Reported on 04/08/2019 02/23/19 02/28/19  McDonald, Mia A, PA-C  traMADol (ULTRAM) 50 MG tablet Take 2 tablets (100 mg total) by mouth every 6 (six) hours as needed for severe pain. Patient not taking: Reported on 04/08/2019 03/14/19   Osborne Oman, MD    Family History Family History  Problem Relation Age of Onset  . Hypertension Mother   . Anemia Mother   . Diabetes Father     Social History Social History   Tobacco Use  . Smoking status: Light Tobacco Smoker    Packs/day: 0.50    Types: Cigarettes  . Smokeless tobacco: Never Used  . Tobacco comment: social use with tobacco  Substance Use Topics  . Alcohol use: Yes    Comment: social  . Drug use: Not Currently     Allergies   Cetirizine & related, Toradol [ketorolac tromethamine], Flagyl [metronidazole], Contrast media [iodinated diagnostic agents], and Nsaids   Review of Systems Review of Systems  Constitutional: Negative for appetite change, chills and fever.  HENT: Negative for ear pain, rhinorrhea, sneezing and sore throat.   Eyes: Negative for photophobia and visual disturbance.  Respiratory: Negative for cough, chest tightness, shortness of breath and wheezing.   Cardiovascular: Negative for chest pain and palpitations.  Gastrointestinal: Negative for abdominal pain, blood in stool, constipation, diarrhea, nausea and vomiting.  Endocrine: Positive for polydipsia and  polyuria.  Genitourinary: Negative for dysuria, hematuria and urgency.  Musculoskeletal: Positive for myalgias.  Skin: Negative for rash.  Neurological: Negative for dizziness, weakness and light-headedness.     Physical Exam Updated Vital Signs BP (!) 111/53   Pulse (!) 110   Temp 98 F (36.7 C) (Oral)   Resp 14   Ht '5\' 7"'  (1.702 m)   Wt 86.2 kg   SpO2 98%   BMI 29.76 kg/m   Physical Exam Vitals signs and nursing note reviewed.  Constitutional:      General: She is not in acute distress.    Appearance: She is well-developed.  HENT:     Head: Normocephalic and atraumatic.  Nose: Nose normal.  Eyes:     General: No scleral icterus.       Left eye: No discharge.     Conjunctiva/sclera: Conjunctivae normal.  Neck:     Musculoskeletal: Normal range of motion and neck supple.  Cardiovascular:     Rate and Rhythm: Regular rhythm. Tachycardia present.     Heart sounds: Normal heart sounds. No murmur. No friction rub. No gallop.   Pulmonary:     Effort: Pulmonary effort is normal. No respiratory distress.     Breath sounds: Normal breath sounds.  Abdominal:     General: Bowel sounds are normal. There is no distension.     Palpations: Abdomen is soft.     Tenderness: There is no abdominal tenderness. There is no guarding.  Musculoskeletal: Normal range of motion.  Skin:    General: Skin is warm and dry.     Findings: No rash.  Neurological:     Mental Status: She is alert.     Motor: No abnormal muscle tone.     Coordination: Coordination normal.      ED Treatments / Results  Labs (all labs ordered are listed, but only abnormal results are displayed) Labs Reviewed  CBG MONITORING, ED - Abnormal; Notable for the following components:      Result Value   Glucose-Capillary 559 (*)    All other components within normal limits  CBG MONITORING, ED - Abnormal; Notable for the following components:   Glucose-Capillary 562 (*)    All other components within normal  limits  CBG MONITORING, ED - Abnormal; Notable for the following components:   Glucose-Capillary 427 (*)    All other components within normal limits  CBG MONITORING, ED - Abnormal; Notable for the following components:   Glucose-Capillary 330 (*)    All other components within normal limits  I-STAT BETA HCG BLOOD, ED ([facility], [facility], [facility] ONLY)    EKG None  Radiology No results found.  Procedures Procedures (including critical care time)  Medications Ordered in ED Medications  sodium chloride 0.9 % bolus 1,000 mL (0 mLs Intravenous Stopped 05/17/19 1707)  fentaNYL (SUBLIMAZE) injection 25 mcg (25 mcg Intravenous Given 05/17/19 1710)  insulin aspart (novoLOG) injection 10 Units (10 Units Subcutaneous Given 05/17/19 1615)  sodium chloride 0.9 % bolus 1,000 mL (1,000 mLs Intravenous New Bag/Given 05/17/19 1839)     Initial Impression / Assessment and Plan / ED Course  I have reviewed the triage vital signs and the nursing notes.  Pertinent labs & imaging results that were available during my care of the patient were reviewed by me and considered in my medical decision making (see chart for details).        33yo F with past medical history of IDDM presents for hyperglycemia. Reports spasms and cramps in muscles which usually improve when hyperglycemia improves. Reports being out of her insulin due to costs for the past 5 days. Initial CBG is 560s. She was seen at urgent care center prior to arrival and given refills of medications but sent to ED for further management. Urinalysis showed ketones. Anion gap of 17. K is normal. Given fluids, insulin with CBG improved to 300s. Patient reports symptoms are overall improving as well. Requesting discharge home after pulling IV and cardiac monitoring leads off. I feel this is reasonable as her CBG has improved and her symptoms have improved as well. She will f/u with PCP, continue her home medications and return for worsening symptoms.  Patient is  hemodynamically stable, in NAD, and able to ambulate in the ED. Evaluation does not show pathology that would require ongoing emergent intervention or inpatient treatment. I explained the diagnosis to the patient. Pain has been managed and has no complaints prior to discharge. Patient is comfortable with above plan and is stable for discharge at this time. All questions were answered prior to disposition. Strict return precautions for returning to the ED were discussed. Encouraged follow up with PCP.   An After Visit Summary was printed and given to the patient.   Portions of this note were generated with Lobbyist. Dictation errors may occur despite best attempts at proofreading.   Final Clinical Impressions(s) / ED Diagnoses   Final diagnoses:  Hyperglycemia    ED Discharge Orders    None       Delia Heady, PA-C 05/17/19 2045    Virgel Manifold, MD 05/18/19 2351

## 2019-05-17 NOTE — ED Notes (Signed)
Pt to go to ED for further care per provider; pt discussed at length with provider; provider stressed importance of going to ED.  Pt verbalized understanding. Informed pt RN will accompany her to ED via wheelchair since family member not present. As Education officer, museum for pt use, pt seen driving in her vehicle out of parking lot.  Told pt to make sure she goes to ED; pt states, "Yeah, I'm going."  Pt then seen in vehicle leaving [facility].

## 2019-05-17 NOTE — ED Notes (Signed)
Pt wanting to go home ama. Pt pulls off IV, BP cuff and EKG cords. RN notified.

## 2019-05-17 NOTE — Discharge Instructions (Addendum)
Continue home medications as previously prescribed. Return for worsening symptoms, chest pain, shortness of breath, leg swelling.

## 2019-05-17 NOTE — ED Notes (Signed)
Talked to pt, told her d/c papers were being prepared. When I returned in less than 5 minutes with d/c papers pt had left.

## 2019-05-17 NOTE — Discharge Instructions (Addendum)
Call your PCPs office and schedule a follow-up appointment for proper management of your diabetes.  You have been given only a 30-day refill of all of your diabetes medications and given how high your blood sugar is along with the accompanying symptoms of muscle cramps and pain in your fingers and toes this could be secondary to poorly controlled diabetes.  You warrant proper management and follow-up for diabetes through the care of her primary care provider.  I have collected blood for testing of your electrolytes along with blood studies and you will be notified via phone of these results later today.  If you develop worsening symptoms of increased thirst, urinary frequency, fatigue, indicate  signs of worsening blood sugar control and warrant immediate follow-up in the ER.

## 2019-05-17 NOTE — ED Provider Notes (Addendum)
[facility]    CSN: [id number] Arrival date & time: 05/17/19  1114      History   Chief Complaint Chief Complaint  Patient presents with  . muscle pain    HPI Jodi Mcgee is a 33 y.o. female.   HPI  Patient a history of uncontrolled type 2 diabetes presents today with hyperglycemia and muscle cramps.  Patient has frequently visited the ER and urgent care multiple times over the last few months.  She has a primary care provider but reports she has a new job and is unable to be seen during traditional work hours and has relied upon the ER to obtain medications.  She reports she is been out of her insulin for more than a week and without her oral diabetes medications for even longer.  She is not checking her blood sugar at home therefore is uncertain of control.  She endorses drinking sodas and other eating behaviors which is nonadherent of a diabetes diet. She complains of numbness and tingling in her fingers and her toes and worsening calf muscle cramps which often awaken her throughout the night.  Patient has had low potassium in the past required oral replacement.  She denies any fever, abdominal pain, nausea or vomiting.  Most recently prescribed medications include Levemir, metformin, and glipizide. Past Medical History:  Diagnosis Date  . Anemia   . Anxiety   . Blood transfusion without reported diagnosis    both CS  . Diabetes mellitus without complication ([place])   . GERD (gastroesophageal reflux disease)    has resolved  . IBS (irritable bowel syndrome)   . Ovarian cyst   . Peritonitis, acute generalized ([place])   . Pulmonary embolism ([place])   . Sepsis [facility])     Patient Active Problem List   Diagnosis Date Noted  . Bartholin's gland abscess 02/21/2019  . DKA (diabetic ketoacidoses) ([place]) 02/20/2019  . Blood per rectum 02/20/2019  . Hyponatremia 09/26/2018  . Chronic hypertension 01/03/2018  . IUD contraception 01/03/2018  . Pulmonary embolism during  puerperium 12/04/2017  . Pulmonary edema 11/30/2017  . Pelvic adhesive disease 10/22/2017  . Nasal septal perforation 10/17/2017  . History of cocaine abuse ([place]) 10/17/2017  . Epistaxis 10/17/2017  . 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 Obesity (BMI 30-39.9) 09/25/2017  . Type 2 diabetes mellitus, without long-term current use of insulin ([place]) 08/23/2017  . Chronic pain syndrome 08/23/2017    Past Surgical History:  Procedure Laterality Date  . APPENDECTOMY     ruptured   . BUNIONECTOMY    . CESAREAN SECTION    . CESAREAN SECTION Bilateral 11/20/2017   Procedure: CESAREAN SECTION;  Surgeon: Sloan Leiter, MD;  Location: [facility];  Service: Obstetrics;  Laterality: Bilateral;  . OVARIAN CYST DRAINAGE    . SMALL BOWEL REPAIR      OB History    Gravida  3   Para  3   Term  1   Preterm  2   AB  0   Living  3     SAB  0   TAB  0   Ectopic  0   Multiple  0   Live Births  3            Home Medications    Prior to Admission medications   Medication Sig Start Date End Date Taking? Authorizing Provider  blood glucose meter kit and supplies KIT Dispense based on patient and insurance preference.  Use up to four times daily as directed. (FOR ICD-9 250.00, 250.01). 02/21/19   Oretha Milch D, MD  cephALEXin (KEFLEX) 500 MG capsule Take 1 capsule (500 mg total) by mouth every 12 (twelve) hours. Patient not taking: Reported on 03/14/2019 02/21/19   Oretha Milch D, MD  doxycycline (VIBRAMYCIN) 100 MG capsule Take 1 capsule (100 mg total) by mouth 2 (two) times daily. Patient not taking: Reported on 03/14/2019 02/25/19   Deno Etienne, DO  glipiZIDE (GLUCOTROL) 5 MG tablet Take 1 tablet (5 mg total) by mouth 2 (two) times daily before a meal. 09/27/18   Charlynne Cousins, MD  insulin detemir (LEVEMIR) 100 unit/ml SOLN Inject 0.2 mLs (20 Units total) into the skin 2 (two) times daily. Patient taking differently: Inject 20 Units into the skin 3 (three) times  daily.  01/08/19   Little, Wenda Overland, MD  Levonorgestrel (LILETTA) 19.5 MCG/DAY IUD IUD 1 each by Intrauterine route continuous. Placed in 2019    [provider]  metFORMIN (GLUCOPHAGE) 1000 MG tablet Take 1 tablet (1,000 mg total) by mouth 2 (two) times daily with a meal. 09/12/18   Ruthann Cancer, Jesse Fall, PA-C  ondansetron (ZOFRAN ODT) 4 MG disintegrating tablet Take 1 tablet (4 mg total) by mouth every 8 (eight) hours as needed. Patient not taking: Reported on 04/08/2019 02/23/19   McDonald, Maree Erie A, PA-C  oxyCODONE-acetaminophen (PERCOCET/ROXICET) 5-325 MG tablet Take 1 tablet by mouth every 4 (four) hours as needed for severe pain. Patient not taking: Reported on 03/14/2019 02/23/19   McDonald, Maree Erie A, PA-C  potassium chloride SA (K-DUR) 20 MEQ tablet Take 1 tablet (20 mEq total) by mouth 2 (two) times daily for 5 days. Patient not taking: Reported on 04/08/2019 02/23/19 02/28/19  McDonald, Mia A, PA-C  traMADol (ULTRAM) 50 MG tablet Take 2 tablets (100 mg total) by mouth every 6 (six) hours as needed for severe pain. Patient not taking: Reported on 04/08/2019 03/14/19   Osborne Oman, MD    Family History Family History  Problem Relation Age of Onset  . Hypertension Mother   . Anemia Mother   . Diabetes Father     Social History Social History   Tobacco Use  . Smoking status: Light Tobacco Smoker    Packs/day: 0.50    Types: Cigarettes  . Smokeless tobacco: Never Used  . Tobacco comment: social use with tobacco  Substance Use Topics  . Alcohol use: Yes    Comment: social  . Drug use: Not Currently     Allergies   Cetirizine & related, Toradol [ketorolac tromethamine], Flagyl [metronidazole], Contrast media [iodinated diagnostic agents], and Nsaids   Review of Systems Review of Systems Pertinent negatives listed in HPI  Physical Exam Triage Vital Signs ED Triage Vitals  Enc Vitals Group     BP 05/17/19 1209 116/87     Pulse Rate 05/17/19 1209 100     Resp 05/17/19  1209 18     Temp 05/17/19 1209 98.6 F (37 C)     Temp src --      SpO2 05/17/19 1209 98 %     Weight 05/17/19 1207 190 lb (86.2 kg)     Height --      Head Circumference --      Peak Flow --      Pain Score 05/17/19 1206 9     Pain Loc --      Pain Edu? --      Excl. in [place]? --  No data found.  Updated Vital Signs BP 116/87 (BP Location: Right Arm)   Pulse 100   Temp 98.6 F (37 C)   Resp 18   Wt 190 lb (86.2 kg)   SpO2 98%   BMI 29.76 kg/m   Visual Acuity Right Eye Distance:   Left Eye Distance:   Bilateral Distance:    Right Eye Near:   Left Eye Near:    Bilateral Near:     Physical Exam General appearance: alert, well developed, well nourished, cooperative and in no distress Head: Normocephalic, without obvious abnormality, atraumatic Respiratory: Respirations even and unlabored, normal respiratory rate Heart: rate and rhythm normal. No gallop or murmurs noted on exam  Abdomen: BS +, no distention, no rebound tenderness, or no mass Extremities: No gross deformities Skin: Skin color, texture, turgor normal. No rashes seen  Psych: Appropriate mood and affect. Neurologic: Mental status: Alert, oriented to person, place, and time, thought content appropriate.   UC Treatments / Results  Labs (all labs ordered are listed, but only abnormal results are displayed) Labs Reviewed  GLUCOSE, CAPILLARY - Abnormal; Notable for the following components:      Result Value   Glucose-Capillary 374 (*)    All other components within normal limits  CBG MONITORING, ED    EKG   Radiology No results found.  Procedures Procedures (including critical care time)  Medications Ordered in UC Medications - No data to display  Initial Impression / Assessment and Plan / UC Course  I have reviewed the triage vital signs and the nursing notes.  Pertinent labs & imaging results that were available during my care of the patient were reviewed by me and considered in my  medical decision making (see chart for details).    Patient presents today with hyperglycemia blood sugar exceeding 300.  She has been seen multiple times in the ER recently for uncontrolled blood sugar.  She reports she is out of her medications therefore will refill metformin, glipizide and Levemir for period of 30 days.  Patient located extensively that urgent cares and ER not proper management for her diabetes as she needs routine management and routine adjustments of her medications in order to have her blood sugars well controlled.  Discussed other red flags warranting prompt return to the ER.  Patient verbalized understanding and agreement to follow-up with primary care to try and obtain an early morning appointment that will not interfere with her current work schedule.  Patient was redirected to ER due to Ketones present in urine. Patient initially agreed to accompany nurse via wheelchair to the ER. While waiting to be transported to the ER, patient left in her personal vehicle and refused ER evaluation at this time. Final Clinical Impressions(s) / UC Diagnoses   Final diagnoses:  Hyperglycemia     Discharge Instructions     Call your PCPs office and schedule a follow-up appointment for proper management of your diabetes.  You have been given only a 30-day refill of all of your diabetes medications and given how high your blood sugar is along with the accompanying symptoms of muscle cramps and pain in your fingers and toes this could be secondary to poorly controlled diabetes.  You warrant proper management and follow-up for diabetes through the care of her primary care provider.  I have collected blood for testing of your electrolytes along with blood studies and you will be notified via phone of these results later today.  If you develop worsening symptoms of  increased thirst, urinary frequency, fatigue, indicate  signs of worsening blood sugar control and warrant immediate follow-up in the  ER.    ED Prescriptions    Medication Sig Dispense Auth. Provider   metFORMIN (GLUCOPHAGE) 1000 MG tablet Take 1 tablet (1,000 mg total) by mouth 2 (two) times daily with a meal. 60 tablet Scot Jun, FNP   glipiZIDE (GLUCOTROL) 5 MG tablet Take 1 tablet (5 mg total) by mouth 2 (two) times daily before a meal. 60 tablet Scot Jun, FNP   insulin detemir (LEVEMIR) 100 unit/ml SOLN Inject 0.2 mLs (20 Units total) into the skin 2 (two) times daily. 6 mL Scot Jun, FNP   cyclobenzaprine (FLEXERIL) 10 MG tablet Take 1 tablet (10 mg total) by mouth 2 (two) times daily as needed for muscle spasms. 20 tablet Scot Jun, FNP     Controlled Substance Prescriptions [place] Controlled Substance Registry consulted? Not Applicable   Scot Jun, FNP 05/17/19 1336    Scot Jun, [place] 05/18/19 1759

## 2019-05-17 NOTE — ED Triage Notes (Signed)
Pt states she having muscle cramps because she has been without she diabetes medinice 6 days. So now she's having muscle cramps.

## 2019-05-17 NOTE — ED Notes (Signed)
Pt contacting family and friends

## 2019-05-18 ENCOUNTER — Encounter (HOSPITAL_COMMUNITY): Payer: Self-pay | Admitting: Emergency Medicine

## 2019-05-18 ENCOUNTER — Other Ambulatory Visit: Payer: Self-pay

## 2019-05-18 ENCOUNTER — Emergency Department (HOSPITAL_COMMUNITY)
Admission: EM | Admit: 2019-05-18 | Discharge: 2019-05-19 | Disposition: A | Discharge status: Home / Self Care | Payer: Medicaid Other | Attending: Emergency Medicine | Admitting: Emergency Medicine

## 2019-05-18 DIAGNOSIS — R631 Polydipsia: Secondary | ICD-10-CM | POA: Diagnosis not present

## 2019-05-18 DIAGNOSIS — L03011 Cellulitis of right finger: Secondary | ICD-10-CM | POA: Insufficient documentation

## 2019-05-18 DIAGNOSIS — R2231 Localized swelling, mass and lump, right upper limb: Secondary | ICD-10-CM | POA: Diagnosis not present

## 2019-05-18 DIAGNOSIS — R350 Frequency of micturition: Secondary | ICD-10-CM | POA: Insufficient documentation

## 2019-05-18 DIAGNOSIS — F17210 Nicotine dependence, cigarettes, uncomplicated: Secondary | ICD-10-CM | POA: Diagnosis not present

## 2019-05-18 DIAGNOSIS — E1165 Type 2 diabetes mellitus with hyperglycemia: Secondary | ICD-10-CM | POA: Insufficient documentation

## 2019-05-18 DIAGNOSIS — R110 Nausea: Secondary | ICD-10-CM | POA: Insufficient documentation

## 2019-05-18 DIAGNOSIS — R739 Hyperglycemia, unspecified: Secondary | ICD-10-CM

## 2019-05-18 LAB — BASIC METABOLIC PANEL
Anion gap: 13 (ref 5–15)
BUN: 17 mg/dL (ref 6–20)
CO2: 23 mmol/L (ref 22–32)
Calcium: 9.1 mg/dL (ref 8.9–10.3)
Chloride: 89 mmol/L — ABNORMAL LOW (ref 98–111)
Creatinine, Ser: 0.67 mg/dL (ref 0.44–1.00)
GFR calc Af Amer: >60 mL/min (ref >60)
GFR calc non Af Amer: >60 mL/min (ref >60)
Glucose, Bld: 685 mg/dL (ref 70–99)
Potassium: 3.7 mmol/L (ref 3.5–5.1)
Sodium: 125 mmol/L — ABNORMAL LOW (ref 135–145)

## 2019-05-18 LAB — URINALYSIS, ROUTINE W REFLEX MICROSCOPIC
Bacteria, UA: NONE SEEN
Bilirubin Urine: NEGATIVE
Glucose, UA: >=500 mg/dL — AB
Hgb urine dipstick: NEGATIVE
Ketones, ur: 5 mg/dL — AB
Leukocytes,Ua: NEGATIVE
Nitrite: NEGATIVE
Protein, ur: NEGATIVE mg/dL
Specific Gravity, Urine: 1.025 (ref 1.005–1.030)
pH: 6 (ref 5.0–8.0)

## 2019-05-18 LAB — CBC
HCT: 39.2 % (ref 36.0–46.0)
Hemoglobin: 12.7 g/dL (ref 12.0–15.0)
MCH: 28.1 pg (ref 26.0–34.0)
MCHC: 32.4 g/dL (ref 30.0–36.0)
MCV: 86.7 fL (ref 80.0–100.0)
Platelets: 259 10*3/uL (ref 150–400)
RBC: 4.52 MIL/uL (ref 3.87–5.11)
RDW: 13.3 % (ref 11.5–15.5)
WBC: 5.1 10*3/uL (ref 4.0–10.5)
nRBC: 0 % (ref 0.0–0.2)

## 2019-05-18 LAB — PREGNANCY, URINE: Preg Test, Ur: NEGATIVE

## 2019-05-18 LAB — CBG MONITORING, ED: Glucose-Capillary: >600 mg/dL (ref 70–99)

## 2019-05-18 NOTE — ED Triage Notes (Signed)
Patient here from home with complaints of hyperglycemia, greater than 600. Reports that she was at [facility] yesterday where she received Levemier and reports "its not working". Also reports muscle spasms all over increased in bilateral legs and hands. Also reports finger swelling to the right ring finger for 2 days.

## 2019-05-18 NOTE — ED Notes (Addendum)
Date and time results received: 05/18/19 2355  Test: glucose Critical Value: 685  Name of Provider Notified: Mes[place], MD

## 2019-05-19 LAB — CBG MONITORING, ED: Glucose-Capillary: 346 mg/dL — ABNORMAL HIGH (ref 70–99)

## 2019-05-19 MED ORDER — CEPHALEXIN 500 MG PO CAPS: 500.0000 mg | ORAL_CAPSULE | Freq: Four times a day (QID) | ORAL | 0 refills | Status: AC

## 2019-05-19 MED ORDER — LIDOCAINE-PRILOCAINE 2.5-2.5 % EX CREA
TOPICAL_CREAM | Freq: Once | CUTANEOUS | Status: AC
  Administered 2019-05-19: 01:00:00 via TOPICAL
  Filled 2019-05-19: qty 5

## 2019-05-19 MED ORDER — OXYCODONE-ACETAMINOPHEN 5-325 MG PO TABS: 1.0000 | ORAL_TABLET | ORAL | 0 refills | Status: DC | PRN

## 2019-05-19 MED ORDER — SODIUM CHLORIDE 0.9 % IV BOLUS
1000.0000 mL | Freq: Once | INTRAVENOUS | Status: AC
  Administered 2019-05-19: 01:00:00 1000 mL via INTRAVENOUS

## 2019-05-19 MED ORDER — OXYCODONE-ACETAMINOPHEN 5-325 MG PO TABS
1.0000 | ORAL_TABLET | Freq: Once | ORAL | Status: AC
  Administered 2019-05-19: 1 via ORAL
  Filled 2019-05-19: qty 1

## 2019-05-19 MED ORDER — SODIUM CHLORIDE 0.9 % IV SOLN
1.0000 g | Freq: Once | INTRAVENOUS | Status: AC
  Administered 2019-05-19: 1 g via INTRAVENOUS
  Filled 2019-05-19: qty 10

## 2019-05-19 MED ORDER — INSULIN ASPART 100 UNIT/ML ~~LOC~~ SOLN
10.0000 [IU] | Freq: Once | SUBCUTANEOUS | Status: AC
  Administered 2019-05-19: 01:00:00 10 [IU] via INTRAVENOUS
  Filled 2019-05-19: qty 0.1

## 2019-05-19 NOTE — ED Provider Notes (Signed)
Emergency Department Provider Note   I have reviewed the triage vital signs and the nursing notes.   HISTORY  Chief Complaint Hyperglycemia, Hand Pain, and Spasms   HPI Jodi Mcgee is a 33 y.o. female with multiple medical problems as documented below who presents the emergency department today secondary to hyperglycemia, hand pain and muscle cramps.  Patient states that never she has high blood sugar like it is now she gets cramping in the back of her legs and the back of her arms and this is not new for her.  When she does have is on her right ring finger some swelling just proximal to the cuticle that is been there for the last few days.  She was out of her insulin because of a trip that she was on recently and thinks is why her blood sugar is high.  She is been urinating more and drinking more water as well.  No fevers.  Some nausea but no vomiting.  States compliance with her Levemir but not sure that is the right insulin that she should be on but has not followed up with her primary doctor secondary to her work schedule.   No other associated or modifying symptoms.    Past Medical History:  Diagnosis Date  . Anemia   . Anxiety   . Blood transfusion without reported diagnosis    both CS  . Diabetes mellitus without complication ([place])   . GERD (gastroesophageal reflux disease)    has resolved  . IBS (irritable bowel syndrome)   . Ovarian cyst   . Peritonitis, acute generalized ([place])   . Pulmonary embolism ([place])   . Sepsis [facility])     Patient Active Problem List   Diagnosis Date Noted  . Bartholin's gland abscess 02/21/2019  . DKA (diabetic ketoacidoses) ([place]) 02/20/2019  . Blood per rectum 02/20/2019  . Hyponatremia 09/26/2018  . Chronic hypertension 01/03/2018  . IUD contraception 01/03/2018  . Pulmonary embolism during puerperium 12/04/2017  . Pulmonary edema 11/30/2017  . Pelvic adhesive disease 10/22/2017  . Nasal septal perforation 10/17/2017  . History  of cocaine abuse ([place]) 10/17/2017  . Epistaxis 10/17/2017  . 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 Obesity (BMI 30-39.9) 09/25/2017  . Type 2 diabetes mellitus, without long-term current use of insulin ([place]) 08/23/2017  . Chronic pain syndrome 08/23/2017    Past Surgical History:  Procedure Laterality Date  . APPENDECTOMY     ruptured   . BUNIONECTOMY    . CESAREAN SECTION    . CESAREAN SECTION Bilateral 11/20/2017   Procedure: CESAREAN SECTION;  Surgeon: Sloan Leiter, MD;  Location: [facility];  Service: Obstetrics;  Laterality: Bilateral;  . OVARIAN CYST DRAINAGE    . SMALL BOWEL REPAIR      Current Outpatient Rx  . Order #: [id number] Class: Print  . Order #: [id number] Class: Normal  . Order #: [id number] Class: Normal  . Order #: [id number] Class: Print  . Order #: [id number] Class: Historical Med  . Order #: [id number] Class: Normal  . Order #: [id number] Class: Print  . Order #: [id number] Class: Print    Allergies Cetirizine & related, Toradol [ketorolac tromethamine], Flagyl [metronidazole], Contrast media [iodinated diagnostic agents], and Nsaids  Family History  Problem Relation Age of Onset  . Hypertension Mother   . Anemia Mother   . Diabetes Father     Social History Social History   Tobacco Use  . Smoking status: Light Tobacco Smoker    Packs/day: 0.50  Types: Cigarettes  . Smokeless tobacco: Never Used  . Tobacco comment: social use with tobacco  Substance Use Topics  . Alcohol use: Yes    Comment: social  . Drug use: Not Currently    Review of Systems  All other systems negative except as documented in the HPI. All pertinent positives and negatives as reviewed in the HPI. ____________________________________________   PHYSICAL EXAM:  VITAL SIGNS: ED Triage Vitals [05/18/19 2250]  Enc Vitals Group     BP (!) 135/110     Pulse Rate (!) 110     Resp 15     Temp 98.6 F (37 C)     Temp Source Oral     SpO2 97 %     Weight 190  lb (86.2 kg)     Height 5' 6.5" (1.689 m)   Constitutional: Alert and oriented. Well appearing and in no acute distress. Eyes: Conjunctivae are normal. PERRL. EOMI. Head: Atraumatic. Nose: No congestion/rhinnorhea. Mouth/Throat: Mucous membranes are moist.  Oropharynx non-erythematous. Neck: No stridor.  No meningeal signs.   Cardiovascular: tachycardic rate, regular rhythm. Good peripheral circulation. Grossly normal heart sounds.   Respiratory: Normal respiratory effort.  No retractions. Lungs CTAB. Gastrointestinal: Soft and nontender. No distention.  Musculoskeletal: No lower extremity tenderness nor edema. No gross deformities of extremities. Neurologic:  Normal speech and language. No gross focal neurologic deficits are appreciated.  Skin:  Skin is warm, dry and intact. No rash noted. Edema and ttp/fluctuance to right ring finger.   ____________________________________________   LABS (all labs ordered are listed, but only abnormal results are displayed)  Labs Reviewed  BASIC METABOLIC PANEL - Abnormal; Notable for the following components:      Result Value   Sodium 125 (*)    Chloride 89 (*)    Glucose, Bld 685 (*)    All other components within normal limits  URINALYSIS, ROUTINE W REFLEX MICROSCOPIC - Abnormal; Notable for the following components:   Color, Urine COLORLESS (*)    Glucose, UA >=500 (*)    Ketones, ur 5 (*)    All other components within normal limits  CBG MONITORING, ED - Abnormal; Notable for the following components:   Glucose-Capillary >600 (*)    All other components within normal limits  CBG MONITORING, ED - Abnormal; Notable for the following components:   Glucose-Capillary 346 (*)    All other components within normal limits  CBC  PREGNANCY, URINE  CBG MONITORING, ED   ____________________________________________  EKG   EKG Interpretation  Date/Time:    Ventricular Rate:    PR Interval:    QRS Duration:   QT Interval:    QTC  Calculation:   R Axis:     Text Interpretation:         ____________________________________________  RADIOLOGY  No results found.  ____________________________________________   PROCEDURES  Procedure(s) performed:   Marland KitchenMarland KitchenIncision and Drainage  Date/Time: 05/19/2019 4:35 AM Performed by: Merrily Pew, MD Authorized by: Merrily Pew, MD   Location:    Indications for incision and drainage: paronychia.   Size:  1.0 cm   Location:  Upper extremity   Upper extremity location:  Finger   Finger location:  R ring finger Pre-procedure details:    Skin preparation:  Betadine Anesthesia (see MAR for exact dosages):    Anesthesia method:  Topical application   Topical anesthetic:  EMLA cream Procedure type:    Complexity:  Simple Procedure details:    Incision types:  Stab incision  Scalpel blade:  11   Drainage:  Bloody and purulent   Drainage amount:  Moderate   Wound treatment:  Wound left open   Packing materials:  None Post-procedure details:    Patient tolerance of procedure:  Tolerated well, no immediate complications     ____________________________________________   INITIAL IMPRESSION / ASSESSMENT AND PLAN / ED COURSE  Hyperglycemia without DKA likely related to noncompliance. Will give some fluids/insulin here to get it better controlled.  Also with paronychia, will start abx/drain.   Paronychia drained with moderate drainage. abx started. Dc on same.   Pertinent labs & imaging results that were available during my care of the patient were reviewed by me and considered in my medical decision making (see chart for details).  A medical screening exam was performed and I feel the patient has had an appropriate workup for their chief complaint at this time and likelihood of emergent condition existing is low. They have been counseled on decision, discharge, follow up and which symptoms necessitate immediate return to the emergency department. They or their  family verbally stated understanding and agreement with plan and discharged in stable condition.   ____________________________________________  FINAL CLINICAL IMPRESSION(S) / ED DIAGNOSES  Final diagnoses:  Hyperglycemia  Paronychia of finger of right hand     MEDICATIONS GIVEN DURING THIS VISIT:  Medications  sodium chloride 0.9 % bolus 1,000 mL (0 mLs Intravenous Stopped 05/19/19 0330)  insulin aspart (novoLOG) injection 10 Units (10 Units Intravenous Given 05/19/19 0114)  lidocaine-prilocaine (EMLA) cream ( Topical Given 05/19/19 0113)  cefTRIAXone (ROCEPHIN) 1 g in sodium chloride 0.9 % 100 mL IVPB (0 g Intravenous Stopped 05/19/19 0146)  oxyCODONE-acetaminophen (PERCOCET/ROXICET) 5-325 MG per tablet 1 tablet (1 tablet Oral Given 05/19/19 0145)     NEW OUTPATIENT MEDICATIONS STARTED DURING THIS VISIT:  Discharge Medication List as of 05/19/2019  3:49 AM      Note:  This note was prepared with assistance of Dragon voice recognition software. Occasional wrong-word or sound-a-like substitutions may have occurred due to the inherent limitations of voice recognition software.   Fredrich Cory, Corene Cornea, MD 05/19/19 [phone]

## 2019-05-19 NOTE — ED Notes (Signed)
Pt verbalized discharge instructions and follow up care. Alert and ambulatory. No IV.  

## 2019-05-29 ENCOUNTER — Telehealth: Payer: Self-pay | Admitting: Obstetrics & Gynecology

## 2019-05-29 NOTE — Telephone Encounter (Signed)
The patient called in stating she is bleeding black blood periodically. She stated it has been going on for two days and she does not want to go to the ER as she is trying to stay our of there. She also stated she was recently diagnosed with an ovarian cyst. Would like a call back as to what she should do. The patient is scheduled for the next upcoming appt. with Dr. Harolyn Rutherford.

## 2019-07-17 ENCOUNTER — Telehealth: Payer: Self-pay | Admitting: Obstetrics & Gynecology

## 2019-07-17 NOTE — Telephone Encounter (Signed)
Attempted to call patient about her appointment on 9/25 @ 10:55. No answer left voicemail instructing patient to wear a face mask for the entire appointment and no visitors are allowed during the visit. Patient instructed not to attend the appointment if she was any symptoms. Symptom list and office number left.

## 2019-07-18 ENCOUNTER — Other Ambulatory Visit: Payer: Self-pay

## 2019-07-18 ENCOUNTER — Ambulatory Visit (INDEPENDENT_AMBULATORY_CARE_PROVIDER_SITE_OTHER): Payer: Medicaid Other | Admitting: Obstetrics & Gynecology

## 2019-07-18 ENCOUNTER — Encounter: Payer: Self-pay | Admitting: Obstetrics & Gynecology

## 2019-07-18 VITALS — BP 104/72 | HR 99 | Temp 98.1°F | Wt 186.6 lb

## 2019-07-18 DIAGNOSIS — N939 Abnormal uterine and vaginal bleeding, unspecified: Secondary | ICD-10-CM

## 2019-07-18 DIAGNOSIS — N83299 Other ovarian cyst, unspecified side: Secondary | ICD-10-CM | POA: Diagnosis present

## 2019-07-18 DIAGNOSIS — B373 Candidiasis of vulva and vagina: Secondary | ICD-10-CM

## 2019-07-18 DIAGNOSIS — N83291 Other ovarian cyst, right side: Secondary | ICD-10-CM | POA: Diagnosis present

## 2019-07-18 DIAGNOSIS — B3731 Acute candidiasis of vulva and vagina: Secondary | ICD-10-CM

## 2019-07-18 MED ORDER — FLUCONAZOLE 150 MG PO TABS: 150.0000 mg | ORAL_TABLET | ORAL | 3 refills | Status: DC

## 2019-07-18 MED ORDER — MEGESTROL ACETATE 40 MG PO TABS: 40.0000 mg | ORAL_TABLET | Freq: Every day | ORAL | 2 refills | Status: AC

## 2019-07-18 MED ORDER — CYCLOBENZAPRINE HCL 10 MG PO TABS: 10.0000 mg | ORAL_TABLET | Freq: Three times a day (TID) | ORAL | 3 refills | Status: DC | PRN

## 2019-07-18 MED ORDER — OXYCODONE-ACETAMINOPHEN 5-325 MG PO TABS: 1.0000 | ORAL_TABLET | ORAL | 0 refills | Status: DC | PRN

## 2019-07-18 NOTE — Progress Notes (Signed)
GYNECOLOGY OFFICE VISIT NOTE  History:   Jodi Mcgee is a 33 y.o. [phone] here today for evaluation of sporadic episodes of AUB, also has history of complex ovarian cysts.  Has a lot of pain due to the cyst, she needs pain medication.  She also has recurrent vaginal yeast infections due to diabetes.   Denies any other concerns.    Past Medical History:  Diagnosis Date  . Anemia   . Anxiety   . Blood transfusion without reported diagnosis    both CS  . Diabetes mellitus without complication ([place])   . GERD (gastroesophageal reflux disease)    has resolved  . IBS (irritable bowel syndrome)   . Ovarian cyst   . Peritonitis, acute generalized ([place])   . Pulmonary embolism ([place])   . Sepsis [facility])     Past Surgical History:  Procedure Laterality Date  . APPENDECTOMY     ruptured   . BUNIONECTOMY    . CESAREAN SECTION    . CESAREAN SECTION Bilateral 11/20/2017   Procedure: CESAREAN SECTION;  Surgeon: Sloan Leiter, MD;  Location: [facility];  Service: Obstetrics;  Laterality: Bilateral;  . OVARIAN CYST DRAINAGE    . SMALL BOWEL REPAIR      The following portions of the patient's history were reviewed and updated as appropriate: allergies, current medications, past family history, past medical history, past social history, past surgical history and problem list.   Health Maintenance:  Normal pap and negative HRHPV on 01/03/2018.    Review of Systems:  Pertinent items noted in HPI and remainder of comprehensive ROS otherwise negative.  Physical Exam:  BP 104/72   Pulse 99   Temp 98.1 F (36.7 C)   Wt 186 lb 9.6 oz (84.6 kg)   BMI 29.67 kg/m  CONSTITUTIONAL: Well-developed, well-nourished female in no acute distress.  HEENT:  Normocephalic, atraumatic. External right and left ear normal. No scleral icterus.  NECK: Normal range of motion, supple, no masses noted on observation SKIN: No rash noted. Not diaphoretic. No erythema. No pallor. MUSCULOSKELETAL: Normal  range of motion. No edema noted. NEUROLOGIC: Alert and oriented to person, place, and time. Normal muscle tone coordination. No cranial nerve deficit noted. PSYCHIATRIC: Normal mood and affect. Normal behavior. Normal judgment and thought content. CARDIOVASCULAR: Normal heart rate noted RESPIRATORY: Effort and breath sounds normal, no problems with respiration noted ABDOMEN: No masses noted. No other overt distention noted.  Moderate lower abdominal tenderness, no rebound or guarding PELVIC: Erythematous vulvr and vaginal noted, white discharge noted.  Normal appearing vaginal mucosa and cervix.  Normal uterine size, no other palpable masses, + uterine or adnexal tenderness.     Assessment and Plan:    1. Vulvovaginitis due to yeast Diflucan ordered - fluconazole (DIFLUCAN) 150 MG tablet; Take 1 tablet (150 mg total) by mouth every 3 (three) days. For three doses  Dispense: 3 tablet; Refill: 3  2. Abnormal uterine bleeding (AUB) Megace prescribed to help, already has Liletta. Infection testing sample obtained. - megestrol (MEGACE) 40 MG tablet; Take 1 tablet (40 mg total) by mouth daily for 10 days. Can increase to two tablets twice a day in the event of heavy bleeding  Dispense: 10 tablet; Refill: 2 - Cervicovaginal ancillary only  3. Complex right ovarian cyst Follow up scan ordered, pain medications ordered. - US PELVIS TRANSVAGINAL NON-OB (TV ONLY); Future - cyclobenzaprine (FLEXERIL) 10 MG tablet; Take 1 tablet (10 mg total) by mouth 3 (three) times daily as  needed for muscle spasms.  Dispense: 30 tablet; Refill: 3 - megestrol (MEGACE) 40 MG tablet; Take 1 tablet (40 mg total) by mouth daily for 10 days. Can increase to two tablets twice a day in the event of heavy bleeding  Dispense: 10 tablet; Refill: 2 - oxyCODONE-acetaminophen (PERCOCET/ROXICET) 5-325 MG tablet; Take 1 tablet by mouth every 4 (four) hours as needed for severe pain.  Dispense: 30 tablet; Refill: 0  Return for any  gynecologic concerns.    Total face-to-face time with patient: 15 minutes.  Over 50% of encounter was spent on counseling and coordination of care.   Verita Schneiders, MD, [place] for [employer], [place]

## 2019-07-18 NOTE — Patient Instructions (Signed)
Return to clinic for any scheduled appointments or for any gynecologic concerns as needed.   

## 2019-07-26 DIAGNOSIS — F17210 Nicotine dependence, cigarettes, uncomplicated: Secondary | ICD-10-CM | POA: Diagnosis not present

## 2019-07-26 DIAGNOSIS — R110 Nausea: Secondary | ICD-10-CM | POA: Insufficient documentation

## 2019-07-26 DIAGNOSIS — Z794 Long term (current) use of insulin: Secondary | ICD-10-CM | POA: Diagnosis not present

## 2019-07-26 DIAGNOSIS — R109 Unspecified abdominal pain: Secondary | ICD-10-CM | POA: Diagnosis present

## 2019-07-26 DIAGNOSIS — Z79899 Other long term (current) drug therapy: Secondary | ICD-10-CM | POA: Insufficient documentation

## 2019-07-26 DIAGNOSIS — E1165 Type 2 diabetes mellitus with hyperglycemia: Secondary | ICD-10-CM | POA: Diagnosis not present

## 2019-07-26 DIAGNOSIS — N12 Tubulo-interstitial nephritis, not specified as acute or chronic: Secondary | ICD-10-CM | POA: Insufficient documentation

## 2019-07-27 ENCOUNTER — Emergency Department (HOSPITAL_COMMUNITY): Payer: Medicaid Other

## 2019-07-27 ENCOUNTER — Other Ambulatory Visit: Payer: Self-pay

## 2019-07-27 ENCOUNTER — Encounter (HOSPITAL_COMMUNITY): Payer: Self-pay | Admitting: Emergency Medicine

## 2019-07-27 ENCOUNTER — Emergency Department (HOSPITAL_COMMUNITY)
Admission: EM | Admit: 2019-07-27 | Discharge: 2019-07-27 | Disposition: A | Discharge status: Home / Self Care | Payer: Medicaid Other | Attending: Emergency Medicine | Admitting: Emergency Medicine

## 2019-07-27 DIAGNOSIS — R52 Pain, unspecified: Secondary | ICD-10-CM

## 2019-07-27 DIAGNOSIS — R739 Hyperglycemia, unspecified: Secondary | ICD-10-CM

## 2019-07-27 DIAGNOSIS — N12 Tubulo-interstitial nephritis, not specified as acute or chronic: Secondary | ICD-10-CM

## 2019-07-27 LAB — CBC
HCT: 39.4 % (ref 36.0–46.0)
Hemoglobin: 12.4 g/dL (ref 12.0–15.0)
MCH: 28.1 pg (ref 26.0–34.0)
MCHC: 31.5 g/dL (ref 30.0–36.0)
MCV: 89.1 fL (ref 80.0–100.0)
Platelets: 305 10*3/uL (ref 150–400)
RBC: 4.42 MIL/uL (ref 3.87–5.11)
RDW: 12.8 % (ref 11.5–15.5)
WBC: 5.3 10*3/uL (ref 4.0–10.5)
nRBC: 0 % (ref 0.0–0.2)

## 2019-07-27 LAB — COMPREHENSIVE METABOLIC PANEL
ALT: 16 U/L (ref 0–44)
AST: 19 U/L (ref 15–41)
Albumin: 3.5 g/dL (ref 3.5–5.0)
Alkaline Phosphatase: 86 U/L (ref 38–126)
Anion gap: 10 (ref 5–15)
BUN: 11 mg/dL (ref 6–20)
CO2: 24 mmol/L (ref 22–32)
Calcium: 8.6 mg/dL — ABNORMAL LOW (ref 8.9–10.3)
Chloride: 88 mmol/L — ABNORMAL LOW (ref 98–111)
Creatinine, Ser: 0.91 mg/dL (ref 0.44–1.00)
GFR calc Af Amer: >60 mL/min (ref >60)
GFR calc non Af Amer: >60 mL/min (ref >60)
Glucose, Bld: 863 mg/dL (ref 70–99)
Potassium: 4.1 mmol/L (ref 3.5–5.1)
Sodium: 122 mmol/L — ABNORMAL LOW (ref 135–145)
Total Bilirubin: 0.4 mg/dL (ref 0.3–1.2)
Total Protein: 7.2 g/dL (ref 6.5–8.1)

## 2019-07-27 LAB — URINALYSIS, ROUTINE W REFLEX MICROSCOPIC
Bacteria, UA: NONE SEEN
Bilirubin Urine: NEGATIVE
Glucose, UA: >=500 mg/dL — AB
Ketones, ur: 5 mg/dL — AB
Leukocytes,Ua: NEGATIVE
Nitrite: NEGATIVE
Protein, ur: NEGATIVE mg/dL
Specific Gravity, Urine: 1.028 (ref 1.005–1.030)
pH: 6 (ref 5.0–8.0)

## 2019-07-27 LAB — CBG MONITORING, ED
Glucose-Capillary: 285 mg/dL — ABNORMAL HIGH (ref 70–99)
Glucose-Capillary: >600 mg/dL (ref 70–99)
Glucose-Capillary: >600 mg/dL (ref 70–99)

## 2019-07-27 LAB — I-STAT BETA HCG BLOOD, ED (MC, WL, AP ONLY): I-stat hCG, quantitative: <5 m[IU]/mL (ref <5)

## 2019-07-27 LAB — LIPASE, BLOOD: Lipase: 30 U/L (ref 11–51)

## 2019-07-27 MED ORDER — LIDOCAINE HCL 1 % IJ SOLN
INTRAMUSCULAR | Status: AC
  Administered 2019-07-27: 07:00:00
  Filled 2019-07-27: qty 20

## 2019-07-27 MED ORDER — SODIUM CHLORIDE 0.9 % IV BOLUS
1000.0000 mL | Freq: Once | INTRAVENOUS | Status: AC
  Administered 2019-07-27: 02:00:00 1000 mL via INTRAVENOUS

## 2019-07-27 MED ORDER — FENTANYL CITRATE (PF) 100 MCG/2ML IJ SOLN
75.0000 ug | Freq: Once | INTRAMUSCULAR | Status: AC
  Administered 2019-07-27: 75 ug via INTRAVENOUS
  Filled 2019-07-27: qty 2

## 2019-07-27 MED ORDER — INSULIN ASPART 100 UNIT/ML ~~LOC~~ SOLN
10.0000 [IU] | Freq: Once | SUBCUTANEOUS | Status: AC
  Administered 2019-07-27: 10 [IU] via INTRAVENOUS
  Filled 2019-07-27: qty 0.1

## 2019-07-27 MED ORDER — AMOXICILLIN-POT CLAVULANATE 875-125 MG PO TABS: 1.0000 | ORAL_TABLET | Freq: Two times a day (BID) | ORAL | 0 refills | Status: AC

## 2019-07-27 MED ORDER — SODIUM CHLORIDE 0.9 % IV BOLUS
1000.0000 mL | Freq: Once | INTRAVENOUS | Status: AC
  Administered 2019-07-27: 1000 mL via INTRAVENOUS

## 2019-07-27 MED ORDER — ONDANSETRON HCL 4 MG/2ML IJ SOLN
4.0000 mg | Freq: Once | INTRAMUSCULAR | Status: AC
  Administered 2019-07-27: 4 mg via INTRAVENOUS
  Filled 2019-07-27: qty 2

## 2019-07-27 MED ORDER — INSULIN ASPART 100 UNIT/ML IV SOLN
15.0000 [IU] | Freq: Once | INTRAVENOUS | Status: AC
  Administered 2019-07-27: 15 [IU] via INTRAVENOUS
  Filled 2019-07-27: qty 0.15

## 2019-07-27 MED ORDER — CEFTRIAXONE SODIUM 1 G IJ SOLR
1.0000 g | Freq: Once | INTRAMUSCULAR | Status: AC
  Administered 2019-07-27: 1 g via INTRAMUSCULAR
  Filled 2019-07-27: qty 10

## 2019-07-27 MED ORDER — SODIUM CHLORIDE 0.9% FLUSH: 3.0000 mL | Freq: Once | INTRAVENOUS | Status: DC

## 2019-07-27 NOTE — Discharge Instructions (Signed)
Thank you for allowing me to care for you today in the Emergency Department.   You can keep your follow-up appointment with OB/GYN tomorrow.  The results of the pelvic ultrasound should be available to your OB/GYN.  Given your symptoms, you were clinically being treated for pyelonephritis.  Your urine culture is pending.  You were given a dose of Rocephin in the ER.  Take 1 tablet of Augmentin 2 times daily for the next week.  Make sure to take the entire course even if your symptoms improve to avoid antibiotic resistance.  You can take 650 mg of Tylenol once every 6 hours for pain control.  Return to the emergency department if you develop persistent vomiting, high fevers, uncontrollable abdominal pain, if your blood glucose is high despite taking her home medications, or if you develop other new, concerning symptoms.

## 2019-07-27 NOTE — ED Notes (Signed)
Date and time results received: 07/27/19 1:46 AM  (use smartphrase ".now" to insert current time)  Test: Glucose Critical Value: 863  Name of Provider Notified: Mia, PA  Orders Received? Or Actions Taken?:

## 2019-07-27 NOTE — ED Provider Notes (Addendum)
[facility] DEPT Provider Note   CSN: [id number] Arrival date & time: 07/26/19  2356     History   Chief Complaint Chief Complaint  Patient presents with  . Abdominal Pain  . Hyperglycemia    HPI Jodi Mcgee is a 33 y.o. female with a history of diabetes mellitus type 2, PE, anxiety, anemia, and complex right ovarian cyst who presents to the emergency department with a chief complaint of right flank pain.  The patient endorses severe, sharp, right flank pain that began earlier tonight while she was driving her car.  She states that the pain seems to go from her right mid back and radiates down into her mid lower abdomen.  No known aggravating or alleviating factors.  She also reports that she has been having urinary pressure for the last few days.  She also noticed some pink discoloration on the toilet tissue within the last 24 hours, but thought she may have had some vaginal spotting.  She typically does not have a period because she has an IUD.  She is feeling nauseated, but has no vomiting.  No fever, chills, diarrhea, constipation, chest pain, shortness of breath, vaginal itching or discharge.  She is followed by OB/GYN for a complex right ovarian cyst and is scheduled for an outpatient pelvic ultrasound tomorrow.  She has been having some right-sided pelvic pain and was advised by her OB/GYN to come to the ER if the pain significantly worsened.  She does note that the sharp pain that she began having tonight is very different from the pain that she has been having related to the right ovarian cyst.  She reports that her blood sugars have been well controlled at home, typically running in the 120s, until today.  She has not missed any doses of her home medications.       The history is provided by the patient. No language interpreter was used.  Hyperglycemia Associated symptoms: abdominal pain and nausea   Associated symptoms: no chest pain, no  confusion, no dizziness, no dysuria, no fever, no shortness of breath, no vomiting and no weakness     Past Medical History:  Diagnosis Date  . Anemia   . Anxiety   . Blood transfusion without reported diagnosis    both CS  . Diabetes mellitus without complication ([place])   . GERD (gastroesophageal reflux disease)    has resolved  . IBS (irritable bowel syndrome)   . Ovarian cyst   . Peritonitis, acute generalized ([place])   . Pulmonary embolism ([place])   . Sepsis [facility])     Patient Active Problem List   Diagnosis Date Noted  . Bartholin's gland abscess 02/21/2019  . DKA (diabetic ketoacidoses) ([place]) 02/20/2019  . Blood per rectum 02/20/2019  . Hyponatremia 09/26/2018  . Chronic hypertension 01/03/2018  . IUD contraception 01/03/2018  . Pulmonary embolism during puerperium 12/04/2017  . Pulmonary edema 11/30/2017  . Pelvic adhesive disease 10/22/2017  . Nasal septal perforation 10/17/2017  . History of cocaine abuse ([place]) 10/17/2017  . Epistaxis 10/17/2017  . 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 Obesity (BMI 30-39.9) 09/25/2017  . Type 2 diabetes mellitus, without long-term current use of insulin ([place]) 08/23/2017  . Chronic pain syndrome 08/23/2017    Past Surgical History:  Procedure Laterality Date  . APPENDECTOMY     ruptured   . BUNIONECTOMY    . CESAREAN SECTION    . CESAREAN SECTION Bilateral 11/20/2017   Procedure: CESAREAN  SECTION;  Surgeon: Sloan Leiter, MD;  Location: [facility];  Service: Obstetrics;  Laterality: Bilateral;  . OVARIAN CYST DRAINAGE    . SMALL BOWEL REPAIR       OB History    Gravida  3   Para  3   Term  1   Preterm  2   AB  0   Living  3     SAB  0   TAB  0   Ectopic  0   Multiple  0   Live Births  3            Home Medications    Prior to Admission medications   Medication Sig Start Date End Date Taking? Authorizing Provider  cyclobenzaprine (FLEXERIL) 10 MG tablet Take 1 tablet (10 mg total)  by mouth 3 (three) times daily as needed for muscle spasms. 07/18/19  Yes Anyanwu, Sallyanne Havers, MD  glipiZIDE (GLUCOTROL) 5 MG tablet Take 1 tablet (5 mg total) by mouth 2 (two) times daily before a meal. 05/17/19  Yes Scot Jun, FNP  insulin detemir (LEVEMIR) 100 unit/ml SOLN Inject 0.2 mLs (20 Units total) into the skin 2 (two) times daily. 05/17/19  Yes Scot Jun, FNP  JARDIANCE 10 MG TABS tablet Take 10 mg by mouth daily.  07/02/19  Yes [provider]  Levonorgestrel (LILETTA) 19.5 MCG/DAY IUD IUD 1 each by Intrauterine route continuous. Placed in 2019   Yes [provider]  megestrol (MEGACE) 40 MG tablet Take 1 tablet (40 mg total) by mouth daily for 10 days. Can increase to two tablets twice a day in the event of heavy bleeding 07/18/19 07/28/19 Yes Anyanwu, Sallyanne Havers, MD  metFORMIN (GLUCOPHAGE) 1000 MG tablet Take 1 tablet (1,000 mg total) by mouth 2 (two) times daily with a meal. 05/17/19  Yes Scot Jun, FNP  oxyCODONE-acetaminophen (PERCOCET/ROXICET) 5-325 MG tablet Take 1 tablet by mouth every 4 (four) hours as needed for severe pain. 07/18/19  Yes Anyanwu, Sallyanne Havers, MD  Pseudoephedrine-APAP-DM 30-325-15 MG CAPS Take 2 capsules by mouth every 6 (six) hours as needed (cough).   Yes [provider]  amoxicillin-clavulanate (AUGMENTIN) 875-125 MG tablet Take 1 tablet by mouth every 12 (twelve) hours for 7 days. 07/27/19 08/03/19  Mourad Cwikla A, PA-C  blood glucose meter kit and supplies KIT Dispense based on patient and insurance preference. Use up to four times daily as directed. (FOR ICD-9 250.00, 250.01). 02/21/19   Desiree Hane, MD    Family History Family History  Problem Relation Age of Onset  . Hypertension Mother   . Anemia Mother   . Diabetes Father     Social History Social History   Tobacco Use  . Smoking status: Light Tobacco Smoker    Packs/day: 0.50    Types: Cigarettes  . Smokeless tobacco: Never Used  . Tobacco comment:  social use with tobacco  Substance Use Topics  . Alcohol use: Yes    Comment: social  . Drug use: Not Currently     Allergies   Cetirizine & related, Toradol [ketorolac tromethamine], Flagyl [metronidazole], Contrast media [iodinated diagnostic agents], and Nsaids   Review of Systems Review of Systems  Constitutional: Negative for activity change, chills, fever and unexpected weight change.  Eyes: Negative for visual disturbance.  Respiratory: Negative for shortness of breath.   Cardiovascular: Negative for chest pain.  Gastrointestinal: Positive for abdominal pain and nausea. Negative for diarrhea and vomiting.  Genitourinary: Positive  for flank pain. Negative for dysuria, frequency, menstrual problem, vaginal discharge and vaginal pain.       Urinary pressure  Musculoskeletal: Negative for back pain.  Skin: Negative for rash.  Allergic/Immunologic: Negative for immunocompromised state.  Neurological: Negative for dizziness, syncope, weakness, numbness and headaches.  Psychiatric/Behavioral: Negative for confusion.     Physical Exam Updated Vital Signs BP 101/71   Pulse 84   Temp 97.9 F (36.6 C) (Oral)   Resp 19   Ht _0  (1.702 m)   Wt 84.4 kg   SpO2 97%   BMI 29.13 kg/m   Physical Exam Vitals signs and nursing note reviewed.  Constitutional:      General: She is not in acute distress.    Appearance: She is not toxic-appearing or diaphoretic.  HENT:     Head: Normocephalic.  Eyes:     Conjunctiva/sclera: Conjunctivae normal.  Neck:     Musculoskeletal: Neck supple.  Cardiovascular:     Rate and Rhythm: Regular rhythm. Tachycardia present.     Heart sounds: No murmur. No friction rub. No gallop.   Pulmonary:     Effort: Pulmonary effort is normal. No respiratory distress.     Breath sounds: No stridor. No wheezing, rhonchi or rales.  Chest:     Chest wall: No tenderness.  Abdominal:     General: There is no distension.     Palpations: Abdomen is soft.  There is no mass.     Tenderness: There is abdominal tenderness. There is right CVA tenderness. There is no left CVA tenderness, guarding or rebound.     Hernia: No hernia is present.     Comments: Tender to palpation over the right CVA region.  She is also tender palpation in the right lower quadrant and suprapubic region.  There is also some mild right upper quadrant tenderness.  No focal right-sided pelvic tenderness.  Normoactive bowel sounds in all 4 quadrants.  Abdomen soft, nondistended.  There is a well-healed vertical scar to the midline of the abdomen that is nontender.  No left-sided abdominal pain.   Skin:    General: Skin is warm.     Findings: No rash.  Neurological:     Mental Status: She is alert.  Psychiatric:        Behavior: Behavior normal.      ED Treatments / Results  Labs (all labs ordered are listed, but only abnormal results are displayed) Labs Reviewed  COMPREHENSIVE METABOLIC PANEL - Abnormal; Notable for the following components:      Result Value   Sodium 122 (*)    Chloride 88 (*)    Glucose, Bld 863 (*)    Calcium 8.6 (*)    All other components within normal limits  URINALYSIS, ROUTINE W REFLEX MICROSCOPIC - Abnormal; Notable for the following components:   Color, Urine COLORLESS (*)    Glucose, UA >=500 (*)    Hgb urine dipstick SMALL (*)    Ketones, ur 5 (*)    All other components within normal limits  CBG MONITORING, ED - Abnormal; Notable for the following components:   Glucose-Capillary >600 (*)    All other components within normal limits  CBG MONITORING, ED - Abnormal; Notable for the following components:   Glucose-Capillary >600 (*)    All other components within normal limits  CBG MONITORING, ED - Abnormal; Notable for the following components:   Glucose-Capillary 285 (*)    All other components within normal limits  URINE  CULTURE  LIPASE, BLOOD  CBC  I-STAT BETA HCG BLOOD, ED ([facility], [facility], [facility] ONLY)  CBG MONITORING, ED    EKG None   Radiology US Pelvis Complete  Result Date: 07/27/2019 CLINICAL DATA:  Right-sided pelvic pain. EXAM: TRANSABDOMINAL AND TRANSVAGINAL ULTRASOUND OF PELVIS DOPPLER ULTRASOUND OF OVARIES TECHNIQUE: Both transabdominal and transvaginal ultrasound examinations of the pelvis were performed. Transabdominal technique was performed for global imaging of the pelvis including uterus, ovaries, adnexal regions, and pelvic cul-de-sac. It was necessary to proceed with endovaginal exam following the transabdominal exam to visualize the endometrium and ovaries. Color and duplex Doppler ultrasound was utilized to evaluate blood flow to the ovaries. COMPARISON:  03/14/2019 FINDINGS: Uterus Measurements: 10 x 3.7 x 5.2 cm = volume: 99 mL. No fibroids or other mass visualized. Endometrium Thickness: 7 mm. No focal abnormality. Intrauterine device in satisfactory position. Right ovary Measurements: 3.4 x 2.4 x 2 cm = volume: 8.6 mL. Normal appearance/no adnexal mass. Left ovary Measurements: 3.2 x 3.8 x 2.9 cm = volume: 18 mL. Normal appearance/no adnexal mass. Pulsed Doppler evaluation of both ovaries demonstrates normal low-resistance arterial and venous waveforms. Other findings No abnormal free fluid. IMPRESSION: 1. No ovarian torsion. 2. Intrauterine device in satisfactory position. Electronically Signed   By: Kathreen Devoid   On: 07/27/2019 05:23   US Renal  Result Date: 07/27/2019 CLINICAL DATA:  Right flank pain EXAM: RENAL / URINARY TRACT ULTRASOUND COMPLETE COMPARISON:  None. FINDINGS: Right Kidney: Renal measurements: 10.2 x 6.1 x 4.8 = volume: 157 mL . Echogenicity within normal limits. No mass or hydronephrosis visualized. Left Kidney: Renal measurements: 12.4 x 5.5 x 6.1 = volume: 220 mL. Echogenicity within normal limits. No mass or hydronephrosis visualized. Bladder: Appears normal for degree of bladder distention. IMPRESSION: No renal calculi or hydronephrosis. Electronically Signed   By: Prudencio Pair M.D.   On:  07/27/2019 02:52   [place] Art/ven Flow Abd Pelv Doppler  Result Date: 07/27/2019 CLINICAL DATA:  Right-sided pelvic pain. EXAM: TRANSABDOMINAL AND TRANSVAGINAL ULTRASOUND OF PELVIS DOPPLER ULTRASOUND OF OVARIES TECHNIQUE: Both transabdominal and transvaginal ultrasound examinations of the pelvis were performed. Transabdominal technique was performed for global imaging of the pelvis including uterus, ovaries, adnexal regions, and pelvic cul-de-sac. It was necessary to proceed with endovaginal exam following the transabdominal exam to visualize the endometrium and ovaries. Color and duplex Doppler ultrasound was utilized to evaluate blood flow to the ovaries. COMPARISON:  03/14/2019 FINDINGS: Uterus Measurements: 10 x 3.7 x 5.2 cm = volume: 99 mL. No fibroids or other mass visualized. Endometrium Thickness: 7 mm. No focal abnormality. Intrauterine device in satisfactory position. Right ovary Measurements: 3.4 x 2.4 x 2 cm = volume: 8.6 mL. Normal appearance/no adnexal mass. Left ovary Measurements: 3.2 x 3.8 x 2.9 cm = volume: 18 mL. Normal appearance/no adnexal mass. Pulsed Doppler evaluation of both ovaries demonstrates normal low-resistance arterial and venous waveforms. Other findings No abnormal free fluid. IMPRESSION: 1. No ovarian torsion. 2. Intrauterine device in satisfactory position. Electronically Signed   By: Kathreen Devoid   On: 07/27/2019 05:23   US Pelvic Complete W Transvaginal And Torsion R/o  Result Date: 07/27/2019 CLINICAL DATA:  Right-sided pelvic pain. EXAM: TRANSABDOMINAL AND TRANSVAGINAL ULTRASOUND OF PELVIS DOPPLER ULTRASOUND OF OVARIES TECHNIQUE: Both transabdominal and transvaginal ultrasound examinations of the pelvis were performed. Transabdominal technique was performed for global imaging of the pelvis including uterus, ovaries, adnexal regions, and pelvic cul-de-sac. It was necessary to proceed with endovaginal exam following the transabdominal exam to  visualize the endometrium and  ovaries. Color and duplex Doppler ultrasound was utilized to evaluate blood flow to the ovaries. COMPARISON:  03/14/2019 FINDINGS: Uterus Measurements: 10 x 3.7 x 5.2 cm = volume: 99 mL. No fibroids or other mass visualized. Endometrium Thickness: 7 mm. No focal abnormality. Intrauterine device in satisfactory position. Right ovary Measurements: 3.4 x 2.4 x 2 cm = volume: 8.6 mL. Normal appearance/no adnexal mass. Left ovary Measurements: 3.2 x 3.8 x 2.9 cm = volume: 18 mL. Normal appearance/no adnexal mass. Pulsed Doppler evaluation of both ovaries demonstrates normal low-resistance arterial and venous waveforms. Other findings No abnormal free fluid. IMPRESSION: 1. No ovarian torsion. 2. Intrauterine device in satisfactory position. Electronically Signed   By: Kathreen Devoid   On: 07/27/2019 05:23    Procedures .Critical Care Performed by: Joanne Gavel, PA-C Authorized by: Joanne Gavel, PA-C   Critical care provider statement:    Critical care time (minutes):  40   Critical care was necessary to treat or prevent imminent or life-threatening deterioration of the following conditions:  Metabolic crisis   Critical care was time spent personally by me on the following activities:  Ordering and performing treatments and interventions, ordering and review of laboratory studies, ordering and review of radiographic studies, pulse oximetry, re-evaluation of patient's condition, review of old charts, obtaining history from patient or surrogate, examination of patient, evaluation of patient's response to treatment and development of treatment plan with patient or surrogate   (including critical care time)  Medications Ordered in ED Medications  sodium chloride flush (NS) 0.9 % injection 3 mL (has no administration in time range)  fentaNYL (SUBLIMAZE) injection 75 mcg (75 mcg Intravenous Given 07/27/19 0131)  sodium chloride 0.9 % bolus 1,000 mL (0 mLs Intravenous Stopped 07/27/19 0430)  ondansetron  (ZOFRAN) injection 4 mg (4 mg Intravenous Given 07/27/19 0130)  sodium chloride 0.9 % bolus 1,000 mL (0 mLs Intravenous Stopped 07/27/19 0229)  insulin aspart (novoLOG) injection 15 Units (15 Units Intravenous Given 07/27/19 0214)  fentaNYL (SUBLIMAZE) injection 75 mcg (75 mcg Intravenous Given 07/27/19 0339)  insulin aspart (novoLOG) injection 10 Units (10 Units Intravenous Given 07/27/19 0331)  cefTRIAXone (ROCEPHIN) injection 1 g (1 g Intramuscular Given 07/27/19 0727)  lidocaine (XYLOCAINE) 1 % (with pres) injection (  Given 07/27/19 0727)     Initial Impression / Assessment and Plan / ED Course  I have reviewed the triage vital signs and the nursing notes.  Pertinent labs & imaging results that were available during my care of the patient were reviewed by me and considered in my medical decision making (see chart for details).        33 year old female with a history of diabetes mellitus type 2, PE, anxiety, anemia, and complex right ovarian cyst who presents to the emergency department with right flank pain that radiates to the suprapubic area accompanied by pressure with urination and nausea, onset this afternoon.  Although she has been compliant with her home medications, her blood sugar is significantly elevated today.  She reports that she has not been eating well due to the nausea.  On exam, she is significantly tender to palpation over the right CVA area and all along the right flank, right lower quadrant, and has suprapubic tenderness.  Abdomen is soft and nondistended.  Will give pain medication and fluids.  She is mildly tachycardic on arrival, but is normotensive and afebrile.  Glucose is 863 with normal anion gap and bicarb.  She has pseudohyponatremia, likely  secondary to hyperglycemia.  No leukocytosis.  UA with mild hemoglobinuria, but otherwise unremarkable.  The patient was discussed with Dr. Kathrynn Humble, attending physician.  She is also been seen and independently evaluated by  him.  Previously, the patient has been treated with IV fluids and significant improvement in hyperglycemia.  However, after 2 L of IV fluids, point-of-care CBG is still reading high.  She was given 15 units of insulin with glucometer still reading high.  A second  dose of 10 units of insulin has been ordered.  Initial suspicion was for pyelonephritis versus ureterolithiasis.  However, the patient does not have a history of kidney stones.  I also reviewed the patient's most recent CT abdomen pelvis and back and there was no evidence of nephrolithiasis at that time.  Urinalysis does not appear infectious.  The patient does have a known right ovarian complex cyst.  Given known cyst, I think it is reasonable to repeat the patient's pelvic ultrasound to ensure her right ovary has not torsed.  Pelvic ultrasound with is unremarkable.  Complex ovarian cyst has resolved.  On reevaluation, the patient's pain has improved.  She has been able to sleep and appears much more comfortable on repeat abdominal exam.  She continues to have a nonsurgical abdomen.  She states that she feels much better and would like to go home.  Hyperglycemia has resolved.  Given history of present illness, I think clinically she should be treated for pyelonephritis.  This plan was discussed with Dr. Kathrynn Humble who is in agreement. She was given IM Rocephin in the ER and I will discharge her with Augmentin.  She has a follow-up appointment with her OB/GYN tomorrow.  Think it is reasonable to keep this appointment for reevaluation.  She has been given strict return precautions to the ER.  All questions answered.  She is safe for discharge to home with outpatient follow-up.  Final Clinical Impressions(s) / ED Diagnoses   Final diagnoses:  Pyelonephritis  Hyperglycemia    ED Discharge Orders         Ordered    amoxicillin-clavulanate (AUGMENTIN) 875-125 MG tablet  Every 12 hours     07/27/19 0704           Joline Maxcy A, PA-C  07/27/19 0845    Melea Prezioso A, PA-C 07/27/19 0846    Gwyneth Fernandez A, PA-C 07/27/19 1610    Varney Biles, MD 07/30/19 [phone]

## 2019-07-27 NOTE — ED Notes (Signed)
PA is at bedside

## 2019-07-27 NOTE — ED Triage Notes (Signed)
Pt reports right flank pain along with pain on urination and nausea. Pt also has elevated blood glucose and not eating.

## 2019-07-27 NOTE — ED Notes (Signed)
Ultrasound at bedside

## 2019-07-28 ENCOUNTER — Ambulatory Visit (HOSPITAL_COMMUNITY): Admission: RE | Admit: 2019-07-28 | Payer: Medicaid Other | Source: Ambulatory Visit

## 2019-07-28 ENCOUNTER — Encounter: Payer: Self-pay | Admitting: *Deleted

## 2019-07-28 ENCOUNTER — Telehealth: Payer: Self-pay | Admitting: Lactation Services

## 2019-07-28 ENCOUNTER — Telehealth (INDEPENDENT_AMBULATORY_CARE_PROVIDER_SITE_OTHER): Payer: Medicaid Other | Admitting: Obstetrics & Gynecology

## 2019-07-28 DIAGNOSIS — N83201 Unspecified ovarian cyst, right side: Secondary | ICD-10-CM

## 2019-07-28 LAB — URINE CULTURE: Culture: NO GROWTH

## 2019-07-28 NOTE — Telephone Encounter (Signed)
Called and spoke with pt to inform her that Dr. Harolyn Rutherford would not prescribe any more Oxy since Ovarian Cyst is no longer there. Informed pt she will need to go to the ED or a PCP for continued pain. Pt voiced understanding and said "OK".

## 2019-07-28 NOTE — Telephone Encounter (Signed)
Please call this patient about her canceled appointment with [place].

## 2019-07-28 NOTE — Telephone Encounter (Signed)
Called pt at her request in regards to questions about cancelled [place].   Pt went to ED due to pain. Pt reports she was told that her Ovarian cyst was no longer there and she reports she is having a lot of pain. She cannot take NSAIDS. Pt reports Tylenol is not helping.   Pt reports her pain is 9/10 today. She reports she has been in the bed all day. Pt reports she has to only get her prescription from Dr. Harolyn Rutherford due to her insurance when I offered to speak with a provider in the office. Sent inbox message to Dr. Harolyn Rutherford. Pt was informed that Dr. Harolyn Rutherford may not answer back today.   Pt reports she is using a heated blanket and is taking 2 extra strength Tylenol every 2 hours, informed her she should only be taking every 6 hours at that dosage, pt reports she needed it badly.   Pt aware to go back to ED as needed and to call the office back as needed.

## 2019-08-20 ENCOUNTER — Other Ambulatory Visit: Payer: Self-pay

## 2019-08-20 ENCOUNTER — Emergency Department (HOSPITAL_COMMUNITY): Payer: Medicaid Other

## 2019-08-20 ENCOUNTER — Emergency Department (HOSPITAL_COMMUNITY)
Admission: EM | Admit: 2019-08-20 | Discharge: 2019-08-21 | Discharge status: Against Medical Advice | Payer: Medicaid Other | Attending: Emergency Medicine | Admitting: Emergency Medicine

## 2019-08-20 ENCOUNTER — Encounter (HOSPITAL_COMMUNITY): Payer: Self-pay | Admitting: Family Medicine

## 2019-08-20 DIAGNOSIS — R079 Chest pain, unspecified: Secondary | ICD-10-CM | POA: Diagnosis present

## 2019-08-20 DIAGNOSIS — Z794 Long term (current) use of insulin: Secondary | ICD-10-CM | POA: Diagnosis not present

## 2019-08-20 DIAGNOSIS — R739 Hyperglycemia, unspecified: Secondary | ICD-10-CM

## 2019-08-20 DIAGNOSIS — E1165 Type 2 diabetes mellitus with hyperglycemia: Secondary | ICD-10-CM | POA: Diagnosis not present

## 2019-08-20 LAB — CBG MONITORING, ED: Glucose-Capillary: >600 mg/dL (ref 70–99)

## 2019-08-20 LAB — CBC
HCT: 43.2 % (ref 36.0–46.0)
Hemoglobin: 14.3 g/dL (ref 12.0–15.0)
MCH: 28.5 pg (ref 26.0–34.0)
MCHC: 33.1 g/dL (ref 30.0–36.0)
MCV: 86.1 fL (ref 80.0–100.0)
Platelets: 326 10*3/uL (ref 150–400)
RBC: 5.02 MIL/uL (ref 3.87–5.11)
RDW: 12.5 % (ref 11.5–15.5)
WBC: 5.8 10*3/uL (ref 4.0–10.5)
nRBC: 0 % (ref 0.0–0.2)

## 2019-08-20 MED ORDER — SODIUM CHLORIDE 0.9 % IV BOLUS
1000.0000 mL | Freq: Once | INTRAVENOUS | Status: AC
  Administered 2019-08-21: 1000 mL via INTRAVENOUS

## 2019-08-20 NOTE — ED Triage Notes (Signed)
Patient has a history of diabetes and use insulin along with Metformin. She states has not had her insulin in 1.5 weeks but has been taking her Metformin.

## 2019-08-20 NOTE — ED Provider Notes (Signed)
[facility] DEPT Provider Note   CSN: [id number] Arrival date & time: 08/20/19  2250     History   Chief Complaint Chief Complaint  Patient presents with  . Chest Pain    HPI Jodi Mcgee is a 33 y.o. female.     The history is provided by the patient and medical records.     33 year old female with history of anemia, anxiety, diabetes, acid reflux, IBS, history of PE not currently on anticoagulation, presenting to the ED with chest pain.  Patient states this began on Saturday evening and has been persistent.  States pain is worse with movement and deep breathing but denies any shortness of breath.  She is not had any palpitations, dizziness, diaphoresis, or feelings of syncope.  No cough, fever, or other upper respiratory symptoms.  She has no known cardiac history.  Unsure of family history as she was adopted.  She is a smoker, approximately half pack per day.  History of PE about a year and a half ago after birth of her daughter.  States she was on Lovenox for several months but then stopped taking it.  States she is not sure if she should still be on it but has not had any issues.  She does have IUD in place [place]).  Has also been having some issues with her blood sugar.  States she ran out of her insulin about a week and a half ago and called her physician for refill, however they would not authorize this over the phone until she went in for an appointment.  The day of her appointment her daughter was sick and could not attend daycare so she had to stay home with her.  States Dr. Maida Sale would not authorize medication so she has been without this for about a week and a half.  She has been taking her oral medications as directed.  She has not had any nausea, vomiting, diarrhea, excessive thirst or frequent urination.  Past Medical History:  Diagnosis Date  . Anemia   . Anxiety   . Blood transfusion without reported diagnosis    both CS  .  Diabetes mellitus without complication ([place])   . GERD (gastroesophageal reflux disease)    has resolved  . IBS (irritable bowel syndrome)   . Ovarian cyst   . Peritonitis, acute generalized ([place])   . Pulmonary embolism ([place])   . Sepsis [facility])     Patient Active Problem List   Diagnosis Date Noted  . Bartholin's gland abscess 02/21/2019  . DKA (diabetic ketoacidoses) ([place]) 02/20/2019  . Blood per rectum 02/20/2019  . Hyponatremia 09/26/2018  . Chronic hypertension 01/03/2018  . IUD contraception 01/03/2018  . Pulmonary embolism during puerperium 12/04/2017  . Pulmonary edema 11/30/2017  . Pelvic adhesive disease 10/22/2017  . Nasal septal perforation 10/17/2017  . History of cocaine abuse ([place]) 10/17/2017  . Epistaxis 10/17/2017  . 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 Obesity (BMI 30-39.9) 09/25/2017  . Type 2 diabetes mellitus, without long-term current use of insulin ([place]) 08/23/2017  . Chronic pain syndrome 08/23/2017    Past Surgical History:  Procedure Laterality Date  . APPENDECTOMY     ruptured   . BUNIONECTOMY    . CESAREAN SECTION    . CESAREAN SECTION Bilateral 11/20/2017   Procedure: CESAREAN SECTION;  Surgeon: Sloan Leiter, MD;  Location: [facility];  Service: Obstetrics;  Laterality: Bilateral;  . OVARIAN CYST DRAINAGE    .  SMALL BOWEL REPAIR       OB History    Gravida  3   Para  3   Term  1   Preterm  2   AB  0   Living  3     SAB  0   TAB  0   Ectopic  0   Multiple  0   Live Births  3            Home Medications    Prior to Admission medications   Medication Sig Start Date End Date Taking? Authorizing Provider  blood glucose meter kit and supplies KIT Dispense based on patient and insurance preference. Use up to four times daily as directed. (FOR ICD-9 250.00, 250.01). 02/21/19   Oretha Milch D, MD  cyclobenzaprine (FLEXERIL) 10 MG tablet Take 1 tablet (10 mg total) by mouth 3 (three) times daily as needed  for muscle spasms. 07/18/19   Anyanwu, Sallyanne Havers, MD  glipiZIDE (GLUCOTROL) 5 MG tablet Take 1 tablet (5 mg total) by mouth 2 (two) times daily before a meal. 05/17/19   Scot Jun, FNP  insulin detemir (LEVEMIR) 100 unit/ml SOLN Inject 0.2 mLs (20 Units total) into the skin 2 (two) times daily. 05/17/19   Scot Jun, FNP  JARDIANCE 10 MG TABS tablet Take 10 mg by mouth daily.  07/02/19   [provider]  Levonorgestrel (LILETTA) 19.5 MCG/DAY IUD IUD 1 each by Intrauterine route continuous. Placed in 2019    [provider]  metFORMIN (GLUCOPHAGE) 1000 MG tablet Take 1 tablet (1,000 mg total) by mouth 2 (two) times daily with a meal. 05/17/19   Scot Jun, FNP  oxyCODONE-acetaminophen (PERCOCET/ROXICET) 5-325 MG tablet Take 1 tablet by mouth every 4 (four) hours as needed for severe pain. 07/18/19   Anyanwu, Sallyanne Havers, MD  Pseudoephedrine-APAP-DM 23-300-76 MG CAPS Take 2 capsules by mouth every 6 (six) hours as needed (cough).    [provider]    Family History Family History  Problem Relation Age of Onset  . Hypertension Mother   . Anemia Mother   . Diabetes Father     Social History Social History   Tobacco Use  . Smoking status: Light Tobacco Smoker    Packs/day: 0.50    Types: Cigarettes  . Smokeless tobacco: Never Used  . Tobacco comment: social use with tobacco  Substance Use Topics  . Alcohol use: Yes    Comment: Last drink: 1 month ago   . Drug use: Not Currently     Allergies   Cetirizine & related, Toradol [ketorolac tromethamine], Flagyl [metronidazole], Contrast media [iodinated diagnostic agents], and Nsaids   Review of Systems Review of Systems  Cardiovascular: Positive for chest pain.  All other systems reviewed and are negative.    Physical Exam Updated Vital Signs BP 113/76 (BP Location: Left Arm)   Pulse 90   Temp 98.4 F (36.9 C) (Oral)   Resp 15   Ht '5\' 7"'  (1.702 m)   Wt 84.4 kg   SpO2 97%   BMI  29.13 kg/m   Physical Exam Vitals signs and nursing note reviewed.  Constitutional:      Appearance: She is well-developed.  HENT:     Head: Normocephalic and atraumatic.  Eyes:     Conjunctiva/sclera: Conjunctivae normal.     Pupils: Pupils are equal, round, and reactive to light.  Neck:     Musculoskeletal: Normal range of motion.  Cardiovascular:  Rate and Rhythm: Normal rate and regular rhythm.     Heart sounds: Normal heart sounds.  Pulmonary:     Effort: Pulmonary effort is normal.     Breath sounds: Normal breath sounds. No wheezing or rhonchi.     Comments: Lungs clear, no distress, speaking in full sentences without difficulty Abdominal:     General: Bowel sounds are normal.     Palpations: Abdomen is soft.  Musculoskeletal: Normal range of motion.  Skin:    General: Skin is warm and dry.  Neurological:     Mental Status: She is alert and oriented to person, place, and time.      ED Treatments / Results  Labs (all labs ordered are listed, but only abnormal results are displayed) Labs Reviewed  BASIC METABOLIC PANEL - Abnormal; Notable for the following components:      Result Value   Sodium 128 (*)    Chloride 92 (*)    Glucose, Bld 596 (*)    All other components within normal limits  BLOOD GAS, VENOUS - Abnormal; Notable for the following components:   pCO2, Ven 41.7 (*)    pO2, Ven 67.0 (*)    Acid-Base Excess 2.8 (*)    All other components within normal limits  CBG MONITORING, ED - Abnormal; Notable for the following components:   Glucose-Capillary >600 (*)    All other components within normal limits  CBC  D-DIMER, QUANTITATIVE (NOT AT [facility])  URINALYSIS, ROUTINE W REFLEX MICROSCOPIC  I-STAT BETA HCG BLOOD, ED ([facility], [facility], [facility] ONLY)  I-STAT BETA HCG BLOOD, ED (NOT ORDERABLE)  TROPONIN I (HIGH SENSITIVITY)  TROPONIN I (HIGH SENSITIVITY)    EKG None  Radiology Dg Chest 2 View  Result Date: 08/20/2019 CLINICAL DATA:  Chest pain and shortness  of breath EXAM: CHEST - 2 VIEW COMPARISON:  01/09/2018 FINDINGS: The heart size and mediastinal contours are within normal limits. Both lungs are clear. The visualized skeletal structures are unremarkable. IMPRESSION: No active cardiopulmonary disease. Electronically Signed   By: Inez Catalina M.D.   On: 08/20/2019 23:42    Procedures Procedures (including critical care time)  Medications Ordered in ED Medications  sodium chloride 0.9 % bolus 1,000 mL (has no administration in time range)     Initial Impression / Assessment and Plan / ED Course  I have reviewed the triage vital signs and the nursing notes.  Pertinent labs & imaging results that were available during my care of the patient were reviewed by me and considered in my medical decision making (see chart for details).  33 year old female presenting to the ED with chest pain.  History of PE in the past, not currently on Lovenox.  She does report pleuritic nature, worse with deep breathing.  EKG with sinus tach but no acute ischemic changes.  Chest x-ray is clear.  Labs are pending.  We will add on D-dimer given her history.  Patient also with significant hyperglycemia with CBG greater than 600.  She has been out of her insulin for a week and a half, physician would not refill this without face-to-face appointment.  We will give IV fluids pending labs, add VBG.  Patient's labs with glucose of 596 but normal bicarb and anion gap.  Clinically not DKA.  VBG is also reassuring.  Troponin and D-dimer both negative.  Lower suspicion for ACS, PE, dissection, or acute cardiac event.  Does report doing a lot of house work recently, may be MSK.  We will plan  for glucose control.  IV fluids are infusing.  2:01 AM Patient mad and upset about being given tylenol for pain.  She has allergy to NSAIDs and toradol.  She called Network engineer and began cursing through intercom.  She reported to RN that she wanted to leave.  Informed this would be AGAINST  MEDICAL ADVICE if so as her blood sugar is still very uncontrolled.  Patient unwilling to stay for further blood sugar checks, repeat vitals, or to sign AMA paperwork.  She is aware of risks of leaving with her blood sugar this high, particularly high risk of developing DKA which she has had multiple times in the past.  Patient got dressed and left the department before any paperwork or prescriptions for insulin could be written for her.  Final Clinical Impressions(s) / ED Diagnoses   Final diagnoses:  Hyperglycemia    ED Discharge Orders    None       Larene Pickett, PA-C 08/21/19 0243    Ward, Delice Bison, DO 08/21/19 0246

## 2019-08-20 NOTE — ED Triage Notes (Signed)
Patient is complaining of mid sternal and left sided chest pain that started on Sunday. Pain radiates to her back. Pain described as pressure and constant. Associated symptoms of shortness of breath, dizziness, and diaphoresis.

## 2019-08-21 ENCOUNTER — Other Ambulatory Visit: Payer: Self-pay

## 2019-08-21 ENCOUNTER — Emergency Department (HOSPITAL_COMMUNITY)
Admission: EM | Admit: 2019-08-21 | Discharge: 2019-08-21 | Discharge status: Against Medical Advice | Payer: Medicaid Other | Source: Home / Self Care

## 2019-08-21 LAB — BLOOD GAS, VENOUS
Acid-Base Excess: 2.8 mmol/L — ABNORMAL HIGH (ref 0.0–2.0)
Bicarbonate: 26.9 mmol/L (ref 20.0–28.0)
O2 Saturation: 93.3 %
Patient temperature: 98.6
pCO2, Ven: 41.7 mmHg — ABNORMAL LOW (ref 44.0–60.0)
pH, Ven: 7.426 (ref 7.250–7.430)
pO2, Ven: 67 mmHg — ABNORMAL HIGH (ref 32.0–45.0)

## 2019-08-21 LAB — BASIC METABOLIC PANEL
Anion gap: 11 (ref 5–15)
BUN: 11 mg/dL (ref 6–20)
CO2: 25 mmol/L (ref 22–32)
Calcium: 9.5 mg/dL (ref 8.9–10.3)
Chloride: 92 mmol/L — ABNORMAL LOW (ref 98–111)
Creatinine, Ser: 0.68 mg/dL (ref 0.44–1.00)
GFR calc Af Amer: >60 mL/min (ref >60)
GFR calc non Af Amer: >60 mL/min (ref >60)
Glucose, Bld: 596 mg/dL (ref 70–99)
Potassium: 3.7 mmol/L (ref 3.5–5.1)
Sodium: 128 mmol/L — ABNORMAL LOW (ref 135–145)

## 2019-08-21 LAB — I-STAT BETA HCG BLOOD, ED (NOT ORDERABLE): I-stat hCG, quantitative: <5 m[IU]/mL (ref <5)

## 2019-08-21 LAB — TROPONIN I (HIGH SENSITIVITY): Troponin I (High Sensitivity): <2 ng/L (ref <18)

## 2019-08-21 LAB — D-DIMER, QUANTITATIVE: D-Dimer, Quant: 0.29 ug/mL-FEU (ref 0.00–0.50)

## 2019-08-21 MED ORDER — ACETAMINOPHEN 500 MG PO TABS
1000.0000 mg | ORAL_TABLET | Freq: Once | ORAL | Status: AC
  Administered 2019-08-21: 1000 mg via ORAL
  Filled 2019-08-21: qty 2

## 2019-08-21 NOTE — ED Notes (Signed)
Pt was unhappy with tylenol given for her pain and stated she wanted to see another provider and if not she wanted to leave because she can take pills at home and she shouldn't have had to wait for so long. Provider made aware, and charge notified.

## 2019-08-21 NOTE — ED Notes (Signed)
Pt leaving AMA, she states that she is in the same condition that she arrived in. I explained to her that we have her on an IV infusion for her sugar, we have given her pain medication and drawn blood. She states that is not enough and she plans to follow up with her PCP tomorrow. She refuses to sign AMA forms. She states, "I ain't signing nothing, this is ridiculous." Pt ambulatory on departure.

## 2019-08-21 NOTE — ED Notes (Signed)
Pt not found in triage room by this RN. Per other triage staff, pt left [facility] to come here. Pt asked about how long the wait was and decided that she was going to leave. Pt refused to stay for work up.

## 2019-08-21 NOTE — ED Notes (Signed)
CRITICAL VALUE STICKER  CRITICAL VALUE: Glu 595  RECEIVER (on-site recipient of call): Ethelene Browns RN  [place] NOTIFIED: 08/21/19 1241a  MESSENGER (representative from lab): Alexis  PA NOTIFIED: [place], [place]  TIME OF NOTIFICATION: 08/21/19 1241a  RESPONSE: see orders

## 2019-08-22 ENCOUNTER — Emergency Department (HOSPITAL_COMMUNITY): Payer: Medicaid Other

## 2019-08-22 ENCOUNTER — Other Ambulatory Visit: Payer: Self-pay

## 2019-08-22 ENCOUNTER — Encounter (HOSPITAL_COMMUNITY): Payer: Self-pay | Admitting: Emergency Medicine

## 2019-08-22 ENCOUNTER — Emergency Department (HOSPITAL_COMMUNITY)
Admission: EM | Admit: 2019-08-22 | Discharge: 2019-08-22 | Disposition: A | Discharge status: Home / Self Care | Payer: Medicaid Other | Attending: Emergency Medicine | Admitting: Emergency Medicine

## 2019-08-22 DIAGNOSIS — D140 Benign neoplasm of middle ear, nasal cavity and accessory sinuses: Secondary | ICD-10-CM | POA: Insufficient documentation

## 2019-08-22 DIAGNOSIS — I10 Essential (primary) hypertension: Secondary | ICD-10-CM | POA: Insufficient documentation

## 2019-08-22 DIAGNOSIS — F17210 Nicotine dependence, cigarettes, uncomplicated: Secondary | ICD-10-CM | POA: Insufficient documentation

## 2019-08-22 DIAGNOSIS — R0789 Other chest pain: Secondary | ICD-10-CM | POA: Insufficient documentation

## 2019-08-22 DIAGNOSIS — R739 Hyperglycemia, unspecified: Secondary | ICD-10-CM

## 2019-08-22 DIAGNOSIS — Z794 Long term (current) use of insulin: Secondary | ICD-10-CM | POA: Diagnosis not present

## 2019-08-22 DIAGNOSIS — E1165 Type 2 diabetes mellitus with hyperglycemia: Secondary | ICD-10-CM | POA: Insufficient documentation

## 2019-08-22 DIAGNOSIS — Z79899 Other long term (current) drug therapy: Secondary | ICD-10-CM | POA: Diagnosis not present

## 2019-08-22 DIAGNOSIS — R079 Chest pain, unspecified: Secondary | ICD-10-CM

## 2019-08-22 LAB — BASIC METABOLIC PANEL
Anion gap: 12 (ref 5–15)
BUN: 10 mg/dL (ref 6–20)
CO2: 23 mmol/L (ref 22–32)
Calcium: 9.2 mg/dL (ref 8.9–10.3)
Chloride: 94 mmol/L — ABNORMAL LOW (ref 98–111)
Creatinine, Ser: 0.61 mg/dL (ref 0.44–1.00)
GFR calc Af Amer: >60 mL/min (ref >60)
GFR calc non Af Amer: >60 mL/min (ref >60)
Glucose, Bld: 503 mg/dL (ref 70–99)
Potassium: 3.8 mmol/L (ref 3.5–5.1)
Sodium: 129 mmol/L — ABNORMAL LOW (ref 135–145)

## 2019-08-22 LAB — D-DIMER, QUANTITATIVE: D-Dimer, Quant: 0.33 ug/mL-FEU (ref 0.00–0.50)

## 2019-08-22 LAB — CBC
HCT: 42.1 % (ref 36.0–46.0)
Hemoglobin: 13.7 g/dL (ref 12.0–15.0)
MCH: 28.1 pg (ref 26.0–34.0)
MCHC: 32.5 g/dL (ref 30.0–36.0)
MCV: 86.4 fL (ref 80.0–100.0)
Platelets: 320 10*3/uL (ref 150–400)
RBC: 4.87 MIL/uL (ref 3.87–5.11)
RDW: 12.4 % (ref 11.5–15.5)
WBC: 5.3 10*3/uL (ref 4.0–10.5)
nRBC: 0 % (ref 0.0–0.2)

## 2019-08-22 LAB — TROPONIN I (HIGH SENSITIVITY): Troponin I (High Sensitivity): <2 ng/L (ref <18)

## 2019-08-22 LAB — GLUCOSE, CAPILLARY: Glucose-Capillary: 403 mg/dL — ABNORMAL HIGH (ref 70–99)

## 2019-08-22 MED ORDER — SODIUM CHLORIDE 0.9 % IV BOLUS
1000.0000 mL | Freq: Once | INTRAVENOUS | Status: AC
  Administered 2019-08-22: 1000 mL via INTRAVENOUS

## 2019-08-22 MED ORDER — FENTANYL CITRATE (PF) 100 MCG/2ML IJ SOLN
50.0000 ug | Freq: Once | INTRAMUSCULAR | Status: AC
  Administered 2019-08-22: 15:00:00 50 ug via INTRAVENOUS
  Filled 2019-08-22: qty 2

## 2019-08-22 NOTE — ED Provider Notes (Signed)
[facility] DEPT Provider Note   CSN: [id number] Arrival date & time: 08/22/19  1236     History   Chief Complaint Chief Complaint  Patient presents with  . Chest Pain  . Neck Pain    HPI Jodi Mcgee is a 33 y.o. female.     Patient is a 33 year old female with a history of diabetes, hypertension and prior PE who presents with chest pain.  She reports a 5-day history of pain in her left chest.  It radiates to her arm and her back.  She says it is worse when she lays flat but elevate better when she sits up.  She denies any associated shortness of breath.  Is not really worse with movements.  Not really worse with deep breathing.  No diaphoresis.  No exertional component.  No leg swelling.  She was seen in the ED 2 days ago and had normal labs other than elevated glucose..  She left prior to completion of treatment because she said she was frustrated for waiting so long and she was only given Tylenol for pain.  She says that the pain is persisted and she saw some abnormal lab results on her blood work and came here for reevaluation.  She denies any prior known history of heart disease.  No history of similar symptoms in the past.     Past Medical History:  Diagnosis Date  . Anemia   . Anxiety   . Blood transfusion without reported diagnosis    both CS  . Diabetes mellitus without complication ([place])   . GERD (gastroesophageal reflux disease)    has resolved  . IBS (irritable bowel syndrome)   . Ovarian cyst   . Peritonitis, acute generalized ([place])   . Pulmonary embolism ([place])   . Sepsis [facility])     Patient Active Problem List   Diagnosis Date Noted  . Bartholin's gland abscess 02/21/2019  . DKA (diabetic ketoacidoses) ([place]) 02/20/2019  . Blood per rectum 02/20/2019  . Hyponatremia 09/26/2018  . Chronic hypertension 01/03/2018  . IUD contraception 01/03/2018  . Pulmonary embolism during puerperium 12/04/2017  . Pulmonary edema  11/30/2017  . Pelvic adhesive disease 10/22/2017  . Nasal septal perforation 10/17/2017  . History of cocaine abuse ([place]) 10/17/2017  . Epistaxis 10/17/2017  . 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 Obesity (BMI 30-39.9) 09/25/2017  . Type 2 diabetes mellitus, without long-term current use of insulin ([place]) 08/23/2017  . Chronic pain syndrome 08/23/2017    Past Surgical History:  Procedure Laterality Date  . APPENDECTOMY     ruptured   . BUNIONECTOMY    . CESAREAN SECTION    . CESAREAN SECTION Bilateral 11/20/2017   Procedure: CESAREAN SECTION;  Surgeon: Sloan Leiter, MD;  Location: [facility];  Service: Obstetrics;  Laterality: Bilateral;  . OVARIAN CYST DRAINAGE    . SMALL BOWEL REPAIR       OB History    Gravida  3   Para  3   Term  1   Preterm  2   AB  0   Living  3     SAB  0   TAB  0   Ectopic  0   Multiple  0   Live Births  3            Home Medications    Prior to Admission medications   Medication Sig Start Date End Date Taking? Authorizing Provider  cyclobenzaprine (FLEXERIL)  10 MG tablet Take 1 tablet (10 mg total) by mouth 3 (three) times daily as needed for muscle spasms. 07/18/19  Yes Anyanwu, Sallyanne Havers, MD  glipiZIDE (GLUCOTROL) 5 MG tablet Take 1 tablet (5 mg total) by mouth 2 (two) times daily before a meal. 05/17/19  Yes Scot Jun, FNP  insulin detemir (LEVEMIR) 100 unit/ml SOLN Inject 0.2 mLs (20 Units total) into the skin 2 (two) times daily. 05/17/19  Yes Scot Jun, FNP  JARDIANCE 10 MG TABS tablet Take 10 mg by mouth daily.  07/02/19  Yes [provider]  Levonorgestrel (LILETTA) 19.5 MCG/DAY IUD IUD 1 each by Intrauterine route continuous. Placed in 2019   Yes [provider]  metFORMIN (GLUCOPHAGE) 1000 MG tablet Take 1 tablet (1,000 mg total) by mouth 2 (two) times daily with a meal. 05/17/19  Yes Scot Jun, FNP  oxyCODONE-acetaminophen (PERCOCET/ROXICET) 5-325 MG tablet Take 1  tablet by mouth every 4 (four) hours as needed for severe pain. 07/18/19  Yes Anyanwu, Sallyanne Havers, MD  Pseudoephedrine-APAP-DM 30-325-15 MG CAPS Take 2 capsules by mouth every 6 (six) hours as needed (cough).   Yes [provider]  blood glucose meter kit and supplies KIT Dispense based on patient and insurance preference. Use up to four times daily as directed. (FOR ICD-9 250.00, 250.01). 02/21/19   Desiree Hane, MD    Family History Family History  Problem Relation Age of Onset  . Hypertension Mother   . Anemia Mother   . Diabetes Father     Social History Social History   Tobacco Use  . Smoking status: Light Tobacco Smoker    Packs/day: 0.50    Types: Cigarettes  . Smokeless tobacco: Never Used  . Tobacco comment: social use with tobacco  Substance Use Topics  . Alcohol use: Yes    Comment: Last drink: 1 month ago   . Drug use: Not Currently     Allergies   Cetirizine & related, Toradol [ketorolac tromethamine], Flagyl [metronidazole], Contrast media [iodinated diagnostic agents], and Nsaids   Review of Systems Review of Systems  Constitutional: Negative for chills, diaphoresis, fatigue and fever.  HENT: Negative for congestion, rhinorrhea and sneezing.   Eyes: Negative.   Respiratory: Positive for chest tightness. Negative for cough and shortness of breath.   Cardiovascular: Positive for chest pain. Negative for leg swelling.  Gastrointestinal: Negative for abdominal pain, blood in stool, diarrhea, nausea and vomiting.  Genitourinary: Negative for difficulty urinating, flank pain, frequency and hematuria.  Musculoskeletal: Negative for arthralgias and back pain.  Skin: Negative for rash.  Neurological: Negative for dizziness, speech difficulty, weakness, numbness and headaches.     Physical Exam Updated Vital Signs BP 97/75   Pulse 73   Temp 98 F (36.7 C) (Oral)   Resp 13   SpO2 100%   Physical Exam Constitutional:      Appearance: She is  well-developed.  HENT:     Head: Normocephalic and atraumatic.  Eyes:     Pupils: Pupils are equal, round, and reactive to light.  Neck:     Musculoskeletal: Normal range of motion and neck supple.  Cardiovascular:     Rate and Rhythm: Normal rate and regular rhythm.     Heart sounds: Normal heart sounds.  Pulmonary:     Effort: Pulmonary effort is normal. No respiratory distress.     Breath sounds: Normal breath sounds. No wheezing or rales.  Chest:     Chest wall: Tenderness (She  does have some reproducible tenderness to the left chest wall, no crepitus or deformity) present.  Abdominal:     General: Bowel sounds are normal.     Palpations: Abdomen is soft.     Tenderness: There is no abdominal tenderness. There is no guarding or rebound.  Musculoskeletal: Normal range of motion.  Lymphadenopathy:     Cervical: No cervical adenopathy.  Skin:    General: Skin is warm and dry.     Findings: No rash.  Neurological:     Mental Status: She is alert and oriented to person, place, and time.      ED Treatments / Results  Labs (all labs ordered are listed, but only abnormal results are displayed) Labs Reviewed  BASIC METABOLIC PANEL - Abnormal; Notable for the following components:      Result Value   Sodium 129 (*)    Chloride 94 (*)    Glucose, Bld 503 (*)    All other components within normal limits  GLUCOSE, CAPILLARY - Abnormal; Notable for the following components:   Glucose-Capillary 403 (*)    All other components within normal limits  CBC  D-DIMER, QUANTITATIVE (NOT AT [facility])  CBG MONITORING, ED  TROPONIN I (HIGH SENSITIVITY)    EKG EKG Interpretation  Date/Time:  Friday August 22 2019 12:47:03 EDT Ventricular Rate:  94 PR Interval:    QRS Duration: 91 QT Interval:  340 QTC Calculation: 426 R Axis:   72 Text Interpretation: Sinus rhythm Left atrial enlargement Baseline wander in lead(s) II III aVF since last tracing no significant change Reconfirmed by  Malvin Johns [phone]) on 08/22/2019 1:25:59 PM   Radiology Dg Chest 2 View  Result Date: 08/22/2019 CLINICAL DATA:  Anterior chest pain left-sided. EXAM: CHEST - 2 VIEW COMPARISON:  08/20/2019 FINDINGS: Lungs are hypoinflated and otherwise clear. Cardiomediastinal silhouette and remainder of the exam is unchanged. IMPRESSION: Hypoinflation without acute cardiopulmonary disease. Electronically Signed   By: Marin Olp M.D.   On: 08/22/2019 13:05   Dg Chest 2 View  Result Date: 08/20/2019 CLINICAL DATA:  Chest pain and shortness of breath EXAM: CHEST - 2 VIEW COMPARISON:  01/09/2018 FINDINGS: The heart size and mediastinal contours are within normal limits. Both lungs are clear. The visualized skeletal structures are unremarkable. IMPRESSION: No active cardiopulmonary disease. Electronically Signed   By: Inez Catalina M.D.   On: 08/20/2019 23:42    Procedures Procedures (including critical care time)  Medications Ordered in ED Medications  fentaNYL (SUBLIMAZE) injection 50 mcg (50 mcg Intravenous Given 08/22/19 1433)  sodium chloride 0.9 % bolus 1,000 mL (1,000 mLs Intravenous New Bag/Given 08/22/19 1547)     Initial Impression / Assessment and Plan / ED Course  I have reviewed the triage vital signs and the nursing notes.  Pertinent labs & imaging results that were available during my care of the patient were reviewed by me and considered in my medical decision making (see chart for details).       Patient is a 33 year old female who presents with chest pain.  Is been constant for the last several days.  It is somewhat reproducible on palpation.  She does not have any associated symptoms.  She has had 2 - troponins and a troponin about 24 hours ago which was also negative.  There are no ischemic changes on EKG.  She does have a history of prior PE but she has had 2 - D-dimers and no shortness of breath or hypoxia that would be more  concerning for PE.  She does not have symptoms that  sound more concerning for dissection.  There is no evidence of pneumonia or pneumothorax.  Her chest x-ray is clear.  Her labs show an elevated glucose but no signs of DKA.  She was given IV fluids and her blood sugar is improving.  She is feeling much better after treatment in the ED and is ready to go home.  She has a follow-up appointment next week with her PCP.  Return precautions were given.   Final Clinical Impressions(s) / ED Diagnoses   Final diagnoses:  Chest pain, unspecified type  Hyperglycemia    ED Discharge Orders    None       Malvin Johns, MD 08/22/19 1744

## 2019-08-22 NOTE — ED Triage Notes (Signed)
Pt reports she was here for chest pains the other night and after 4 hours was offered Tylenol for her pain, so left bc she was upset that she could have taken that at home and left AMA. Pt was at work and looking at her results form the other night and showed something about an enlarged heart. Reports still having left sided chest pains and now radiating to her neck.

## 2019-08-22 NOTE — Discharge Instructions (Addendum)
Follow-up with your primary care doctor as directed for a reevaluation.  Make sure that you keep a close check on your blood sugars at home.  Return to the emergency room if you have any worsening symptoms.

## 2019-08-22 NOTE — ED Notes (Addendum)
CRITICAL VALUE STICKER  CRITICAL VALUE: Glucose 503  RECEIVER (on-site recipient of call): Epifanio Lesches  DATE & TIME NOTIFIED: 08/22/2019 1534  MESSENGER (representative from lab): Audree Bane  MD NOTIFIED: [place]: [id number]  RESPONSE:

## 2019-09-25 ENCOUNTER — Encounter: Payer: Self-pay | Admitting: Obstetrics & Gynecology

## 2019-09-25 ENCOUNTER — Ambulatory Visit: Payer: Medicaid Other | Admitting: Obstetrics & Gynecology

## 2019-09-25 NOTE — Progress Notes (Deleted)
   Patient did not show up today for her scheduled appointment.   Wofford Stratton, MD, FACOG Obstetrician & Gynecologist, [facility], [facility]  

## 2019-12-16 ENCOUNTER — Encounter (HOSPITAL_COMMUNITY): Payer: Self-pay | Admitting: Family Medicine

## 2019-12-16 ENCOUNTER — Emergency Department (HOSPITAL_COMMUNITY)
Admission: EM | Admit: 2019-12-16 | Discharge: 2019-12-16 | Disposition: A | Discharge status: Home / Self Care | Payer: Medicaid Other | Attending: Emergency Medicine | Admitting: Emergency Medicine

## 2019-12-16 ENCOUNTER — Other Ambulatory Visit: Payer: Self-pay

## 2019-12-16 ENCOUNTER — Emergency Department (HOSPITAL_COMMUNITY): Payer: Medicaid Other

## 2019-12-16 DIAGNOSIS — Z79899 Other long term (current) drug therapy: Secondary | ICD-10-CM | POA: Diagnosis not present

## 2019-12-16 DIAGNOSIS — F17210 Nicotine dependence, cigarettes, uncomplicated: Secondary | ICD-10-CM | POA: Diagnosis not present

## 2019-12-16 DIAGNOSIS — R739 Hyperglycemia, unspecified: Secondary | ICD-10-CM

## 2019-12-16 DIAGNOSIS — R1010 Upper abdominal pain, unspecified: Secondary | ICD-10-CM | POA: Insufficient documentation

## 2019-12-16 DIAGNOSIS — Z9114 Patient's other noncompliance with medication regimen: Secondary | ICD-10-CM | POA: Insufficient documentation

## 2019-12-16 DIAGNOSIS — E1165 Type 2 diabetes mellitus with hyperglycemia: Secondary | ICD-10-CM | POA: Diagnosis not present

## 2019-12-16 DIAGNOSIS — D140 Benign neoplasm of middle ear, nasal cavity and accessory sinuses: Secondary | ICD-10-CM | POA: Insufficient documentation

## 2019-12-16 LAB — CBC WITH DIFFERENTIAL/PLATELET
Abs Immature Granulocytes: 0.01 10*3/uL (ref 0.00–0.07)
Basophils Absolute: 0 10*3/uL (ref 0.0–0.1)
Basophils Relative: 1 %
Eosinophils Absolute: 0 10*3/uL (ref 0.0–0.5)
Eosinophils Relative: 1 %
HCT: 41.7 % (ref 36.0–46.0)
Hemoglobin: 13.8 g/dL (ref 12.0–15.0)
Immature Granulocytes: 0 %
Lymphocytes Relative: 14 %
Lymphs Abs: 0.9 10*3/uL (ref 0.7–4.0)
MCH: 28.7 pg (ref 26.0–34.0)
MCHC: 33.1 g/dL (ref 30.0–36.0)
MCV: 86.7 fL (ref 80.0–100.0)
Monocytes Absolute: 0.5 10*3/uL (ref 0.1–1.0)
Monocytes Relative: 8 %
Neutro Abs: 4.8 10*3/uL (ref 1.7–7.7)
Neutrophils Relative %: 76 %
Platelets: 399 10*3/uL (ref 150–400)
RBC: 4.81 MIL/uL (ref 3.87–5.11)
RDW: 12.9 % (ref 11.5–15.5)
WBC: 6.2 10*3/uL (ref 4.0–10.5)
nRBC: 0 % (ref 0.0–0.2)

## 2019-12-16 LAB — COMPREHENSIVE METABOLIC PANEL
ALT: 16 U/L (ref 0–44)
AST: 12 U/L — ABNORMAL LOW (ref 15–41)
Albumin: 3.8 g/dL (ref 3.5–5.0)
Alkaline Phosphatase: 87 U/L (ref 38–126)
Anion gap: 12 (ref 5–15)
BUN: 15 mg/dL (ref 6–20)
CO2: 24 mmol/L (ref 22–32)
Calcium: 8.8 mg/dL — ABNORMAL LOW (ref 8.9–10.3)
Chloride: 88 mmol/L — ABNORMAL LOW (ref 98–111)
Creatinine, Ser: 1.07 mg/dL — ABNORMAL HIGH (ref 0.44–1.00)
GFR calc Af Amer: >60 mL/min (ref >60)
GFR calc non Af Amer: >60 mL/min (ref >60)
Glucose, Bld: 844 mg/dL (ref 70–99)
Potassium: 4.3 mmol/L (ref 3.5–5.1)
Sodium: 124 mmol/L — ABNORMAL LOW (ref 135–145)
Total Bilirubin: 0.7 mg/dL (ref 0.3–1.2)
Total Protein: 7.7 g/dL (ref 6.5–8.1)

## 2019-12-16 LAB — URINALYSIS, ROUTINE W REFLEX MICROSCOPIC
Bacteria, UA: NONE SEEN
Bilirubin Urine: NEGATIVE
Glucose, UA: >=500 mg/dL — AB
Hgb urine dipstick: NEGATIVE
Ketones, ur: 20 mg/dL — AB
Leukocytes,Ua: NEGATIVE
Nitrite: NEGATIVE
Protein, ur: NEGATIVE mg/dL
Specific Gravity, Urine: 1.027 (ref 1.005–1.030)
pH: 6 (ref 5.0–8.0)

## 2019-12-16 LAB — CBG MONITORING, ED
Glucose-Capillary: >600 mg/dL (ref 70–99)
Glucose-Capillary: >600 mg/dL (ref 70–99)
Glucose-Capillary: 396 mg/dL — ABNORMAL HIGH (ref 70–99)
Glucose-Capillary: 292 mg/dL — ABNORMAL HIGH (ref 70–99)

## 2019-12-16 LAB — BLOOD GAS, VENOUS
Acid-Base Excess: 1.9 mmol/L (ref 0.0–2.0)
Bicarbonate: 25.9 mmol/L (ref 20.0–28.0)
FIO2: 21
O2 Saturation: 90.5 %
Patient temperature: 98.6
pCO2, Ven: 40.1 mmHg — ABNORMAL LOW (ref 44.0–60.0)
pH, Ven: 7.426 (ref 7.250–7.430)
pO2, Ven: 59.9 mmHg — ABNORMAL HIGH (ref 32.0–45.0)

## 2019-12-16 LAB — I-STAT BETA HCG BLOOD, ED (MC, WL, AP ONLY): I-stat hCG, quantitative: <5 m[IU]/mL (ref <5)

## 2019-12-16 LAB — LIPASE, BLOOD: Lipase: 46 U/L (ref 11–51)

## 2019-12-16 MED ORDER — METOCLOPRAMIDE HCL 5 MG/ML IJ SOLN
10.0000 mg | Freq: Once | INTRAMUSCULAR | Status: AC
  Administered 2019-12-16: 10 mg via INTRAVENOUS
  Filled 2019-12-16: qty 2

## 2019-12-16 MED ORDER — BARIUM SULFATE 2.1 % PO SUSP
450.0000 mL | Freq: Two times a day (BID) | ORAL | Status: DC
  Administered 2019-12-16: 900 mL via ORAL

## 2019-12-16 MED ORDER — SODIUM CHLORIDE 0.9 % IV BOLUS
1000.0000 mL | Freq: Once | INTRAVENOUS | Status: AC
  Administered 2019-12-16: 1000 mL via INTRAVENOUS

## 2019-12-16 MED ORDER — FENTANYL CITRATE (PF) 100 MCG/2ML IJ SOLN: 50.0000 ug | Freq: Once | INTRAMUSCULAR | Status: AC

## 2019-12-16 MED ORDER — SODIUM CHLORIDE 0.9 % IV SOLN: INTRAVENOUS | Status: DC

## 2019-12-16 MED ORDER — INSULIN REGULAR(HUMAN) IN NACL 100-0.9 UT/100ML-% IV SOLN
INTRAVENOUS | Status: DC
  Administered 2019-12-16: 14 [IU]/h via INTRAVENOUS
  Filled 2019-12-16: qty 100

## 2019-12-16 MED ORDER — FENTANYL CITRATE (PF) 100 MCG/2ML IJ SOLN
INTRAMUSCULAR | Status: AC
  Administered 2019-12-16: 50 ug via INTRAVENOUS
  Filled 2019-12-16: qty 2

## 2019-12-16 MED ORDER — DEXTROSE 50 % IV SOLN: 0.0000 mL | INTRAVENOUS | Status: DC | PRN

## 2019-12-16 MED ORDER — LACTATED RINGERS IV BOLUS
1000.0000 mL | Freq: Once | INTRAVENOUS | Status: AC
  Administered 2019-12-16: 1000 mL via INTRAVENOUS

## 2019-12-16 MED ORDER — DEXTROSE-NACL 5-0.45 % IV SOLN: INTRAVENOUS | Status: DC

## 2019-12-16 NOTE — ED Provider Notes (Signed)
[facility] DEPT Provider Note   CSN: [id number] Arrival date & time: 12/16/19  1008     History Chief Complaint  Patient presents with   Abdominal Pain   Hyperglycemia    Jodi Mcgee is a 34 y.o. female.  HPI      Jodi Mcgee is a 34 y.o. female, with a history of type II DM, GERD, IBS, PE, presenting to the ED with abdominal pain over the last week.  Pain is cramping, located in the upper abdomen, radiating to the back, moderate to severe, waxing and waning.  Accompanied by nausea, diaphoresis, loose stools, and decreased appetite.  She states she has had similar pain with pancreatitis.  She drinks about 5 alcoholic drinks a week, but states these are typically on the weekends.  Last alcohol was just before pain started. She also notes she has had difficulty controlling her blood sugar in the last several weeks.  Over the last 2 days, her home meter has simply read "High."  She is on 20 units Levemir 3 times daily with last dose around 6 AM this morning.  Denies fever/chills, cough, shortness of breath, chest pain, syncope, hematochezia/melena, dizziness, vomiting, or any other complaints.    Past Medical History:  Diagnosis Date   Anemia    Anxiety    Blood transfusion without reported diagnosis    both CS   Diabetes mellitus without complication ([place])    GERD (gastroesophageal reflux disease)    has resolved   IBS (irritable bowel syndrome)    Ovarian cyst    Peritonitis, acute generalized ([place])    Pulmonary embolism ([place])    Sepsis ([place])     Patient Active Problem List   Diagnosis Date Noted   Bartholin's gland abscess 02/21/2019   DKA (diabetic ketoacidoses) ([place]) 02/20/2019   Blood per rectum 02/20/2019   Hyponatremia 09/26/2018   Chronic hypertension 01/03/2018   IUD contraception 01/03/2018   Pulmonary embolism during puerperium 12/04/2017   Pulmonary edema 11/30/2017   Pelvic adhesive  disease 10/22/2017   Nasal septal perforation 10/17/2017   History of cocaine abuse ([place]) 10/17/2017   Epistaxis 10/17/2017   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   Obesity (BMI 30-39.9) 09/25/2017   Type 2 diabetes mellitus, without long-term current use of insulin ([place]) 08/23/2017   Chronic pain syndrome 08/23/2017    Past Surgical History:  Procedure Laterality Date   APPENDECTOMY     ruptured    BUNIONECTOMY     CESAREAN SECTION     CESAREAN SECTION Bilateral 11/20/2017   Procedure: CESAREAN SECTION;  Surgeon: Sloan Leiter, MD;  Location: [facility];  Service: Obstetrics;  Laterality: Bilateral;   OVARIAN CYST DRAINAGE     SMALL BOWEL REPAIR       OB History    Gravida  3   Para  3   Term  1   Preterm  2   AB  0   Living  3     SAB  0   TAB  0   Ectopic  0   Multiple  0   Live Births  3           Family History  Problem Relation Age of Onset   Hypertension Mother    Anemia Mother    Diabetes Father     Social History   Tobacco Use   Smoking status: Light Tobacco Smoker    Packs/day: 0.50    Types:  Cigarettes   Smokeless tobacco: Never Used   Tobacco comment: social use with tobacco  Substance Use Topics   Alcohol use: Yes    Comment: 3 times a month   Drug use: Not Currently    Home Medications Prior to Admission medications   Medication Sig Start Date End Date Taking? Authorizing Provider  cyclobenzaprine (FLEXERIL) 10 MG tablet Take 1 tablet (10 mg total) by mouth 3 (three) times daily as needed for muscle spasms. 07/18/19  Yes Anyanwu, Sallyanne Havers, MD  insulin detemir (LEVEMIR) 100 unit/ml SOLN Inject 0.2 mLs (20 Units total) into the skin 2 (two) times daily. 05/17/19  Yes Scot Jun, FNP  JARDIANCE 10 MG TABS tablet Take 10 mg by mouth at bedtime.  07/02/19  Yes [provider]  Levonorgestrel (LILETTA) 19.5 MCG/DAY IUD IUD 1 each by Intrauterine route continuous. Placed in 2019   Yes  [provider]  metFORMIN (GLUCOPHAGE) 1000 MG tablet Take 1 tablet (1,000 mg total) by mouth 2 (two) times daily with a meal. 05/17/19  Yes Scot Jun, FNP  oxyCODONE-acetaminophen (PERCOCET/ROXICET) 5-325 MG tablet Take 1 tablet by mouth every 4 (four) hours as needed for severe pain. 07/18/19  Yes Anyanwu, Sallyanne Havers, MD  blood glucose meter kit and supplies KIT Dispense based on patient and insurance preference. Use up to four times daily as directed. (FOR ICD-9 250.00, 250.01). 02/21/19   Oretha Milch D, MD  glipiZIDE (GLUCOTROL) 5 MG tablet Take 1 tablet (5 mg total) by mouth 2 (two) times daily before a meal. Patient not taking: Reported on 12/16/2019 05/17/19   Scot Jun, FNP    Allergies    Cetirizine & related, Toradol [ketorolac tromethamine], Flagyl [metronidazole], Contrast media [iodinated diagnostic agents], and Nsaids  Review of Systems   Review of Systems  Constitutional: Positive for diaphoresis. Negative for chills and fever.  Respiratory: Negative for cough and shortness of breath.   Cardiovascular: Negative for chest pain and leg swelling.  Gastrointestinal: Positive for abdominal pain, diarrhea and nausea. Negative for blood in stool and vomiting.  Endocrine: Positive for polydipsia and polyuria.  Genitourinary: Negative for dysuria and flank pain.  Neurological: Negative for dizziness, syncope and weakness.  All other systems reviewed and are negative.   Physical Exam Updated Vital Signs BP 102/88 (BP Location: Left Arm)    Pulse (!) 117    Temp 98.1 F (36.7 C) (Oral)    Resp 18    Ht '5\' 6"'  (1.676 m)    Wt 85.7 kg    SpO2 96%    BMI 30.51 kg/m   Physical Exam Vitals and nursing note reviewed.  Constitutional:      General: She is not in acute distress.    Appearance: She is well-developed. She is not diaphoretic.  HENT:     Head: Normocephalic and atraumatic.     Mouth/Throat:     Mouth: Mucous membranes are dry.     Pharynx:  Oropharynx is clear.  Eyes:     Conjunctiva/sclera: Conjunctivae normal.  Cardiovascular:     Rate and Rhythm: Regular rhythm. Tachycardia present.     Pulses: Normal pulses.          Radial pulses are 2+ on the right side and 2+ on the left side.       Posterior tibial pulses are 2+ on the right side and 2+ on the left side.     Heart sounds: Normal heart sounds.     Comments:  Tactile temperature in the extremities appropriate and equal bilaterally. Pulmonary:     Effort: Pulmonary effort is normal. No respiratory distress.     Breath sounds: Normal breath sounds.  Abdominal:     Palpations: Abdomen is soft.     Tenderness: There is abdominal tenderness. There is no guarding.    Musculoskeletal:     Cervical back: Neck supple.     Right lower leg: No edema.     Left lower leg: No edema.  Lymphadenopathy:     Cervical: No cervical adenopathy.  Skin:    General: Skin is warm and dry.  Neurological:     Mental Status: She is alert and oriented to person, place, and time.     Comments: No noted acute cognitive deficit. Sensation grossly intact to light touch in the extremities.   Grip strengths equal bilaterally.   Strength 5/5 in all extremities.  No gait disturbance.  Coordination intact.  Cranial nerves III-XII grossly intact.  Handles oral secretions without noted difficulty.  No noted phonation or speech deficit. No facial droop.   Psychiatric:        Mood and Affect: Mood and affect normal.        Speech: Speech normal.        Behavior: Behavior normal.     ED Results / Procedures / Treatments   Labs (all labs ordered are listed, but only abnormal results are displayed) Labs Reviewed  URINALYSIS, ROUTINE W REFLEX MICROSCOPIC - Abnormal; Notable for the following components:      Result Value   Color, Urine STRAW (*)    Glucose, UA >=500 (*)    Ketones, ur 20 (*)    All other components within normal limits  BLOOD GAS, VENOUS - Abnormal; Notable for the  following components:   pCO2, Ven 40.1 (*)    pO2, Ven 59.9 (*)    All other components within normal limits  COMPREHENSIVE METABOLIC PANEL - Abnormal; Notable for the following components:   Sodium 124 (*)    Chloride 88 (*)    Glucose, Bld 844 (*)    Creatinine, Ser 1.07 (*)    Calcium 8.8 (*)    AST 12 (*)    All other components within normal limits  CBG MONITORING, ED - Abnormal; Notable for the following components:   Glucose-Capillary >600 (*)    All other components within normal limits  CBG MONITORING, ED - Abnormal; Notable for the following components:   Glucose-Capillary >600 (*)    All other components within normal limits  CBG MONITORING, ED - Abnormal; Notable for the following components:   Glucose-Capillary 396 (*)    All other components within normal limits  CBG MONITORING, ED - Abnormal; Notable for the following components:   Glucose-Capillary 292 (*)    All other components within normal limits  SARS CORONAVIRUS 2 (TAT 6-24 HRS)  LIPASE, BLOOD  CBC WITH DIFFERENTIAL/PLATELET  I-STAT BETA HCG BLOOD, ED ([facility], [facility], [facility] ONLY)    BUN  Date Value Ref Range Status  12/16/2019 15 6 - 20 mg/dL Final  08/22/2019 10 6 - 20 mg/dL Final  08/20/2019 11 6 - 20 mg/dL Final  07/27/2019 11 6 - 20 mg/dL Final  11/12/2017 7 6 - 20 mg/dL Final  10/11/2017 9 6 - 20 mg/dL Final  08/23/2017 7 6 - 20 mg/dL Final  06/26/2014 10 7 - 18 mg/dL Final   Creatinine  Date Value Ref Range Status  06/26/2014 0.89 0.60 -  1.30 mg/dL Final   Creatinine, Ser  Date Value Ref Range Status  12/16/2019 1.07 (H) 0.44 - 1.00 mg/dL Final  08/22/2019 0.61 0.44 - 1.00 mg/dL Final  08/20/2019 0.68 0.44 - 1.00 mg/dL Final  07/27/2019 0.91 0.44 - 1.00 mg/dL Final     EKG None  Radiology CT ABDOMEN PELVIS WO CONTRAST  Result Date: 12/16/2019 CLINICAL DATA:  Upper abdominal pain for week. Nausea. Suspicion of pancreatitis. Diabetes. EXAM: CT ABDOMEN AND PELVIS WITHOUT CONTRAST TECHNIQUE:  Multidetector CT imaging of the abdomen and pelvis was performed following the standard protocol without IV contrast. COMPARISON:  02/25/2019 FINDINGS: Lower chest: Clear lung bases. Normal heart size without pericardial or pleural effusion. Hepatobiliary: Normal liver. Normal gallbladder, without biliary ductal dilatation. Pancreas: Normal, without mass or ductal dilatation. Spleen: Normal in size, without focal abnormality. Adrenals/Urinary Tract: Right adrenal nodularity, including at 1.6 cm on 33/2. This is not readily apparent back in 2015. Is similar to on the prior exam and present on 12/18/2017. Indeterminate based on density measurements. No renal calculi or hydronephrosis. No bladder calculi. Stomach/Bowel: Normal stomach, without wall thickening. Colonic stool burden suggests constipation. Normal terminal ileum. Normal small bowel. Vascular/Lymphatic: Normal caliber of the aorta and branch vessels. No abdominopelvic adenopathy. Reproductive: Intrauterine device. The longitudinal component of the device is positioned only within the fundus. example sagittal image 83. No adnexal mass. Other: No significant free fluid. Musculoskeletal: No acute osseous abnormality. IMPRESSION: 1.  No acute process in the abdomen or pelvis. 2. 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 3. Intrauterine device, positioned entirely within the uterine fundus, without extension towards the endocervical canal. Correlate with string positioning. 4.  Possible constipation. Electronically Signed   By: Abigail Miyamoto M.D.   On: 12/16/2019 15:07    Procedures Procedures (including critical care time)  Medications Ordered in ED Medications  insulin regular, human (MYXREDLIN) 100 units/ 100 mL infusion (9.5 Units/hr Intravenous Rate/Dose Change 12/16/19 1530)  0.9 %  sodium chloride infusion ( Intravenous New Bag/Given 12/16/19 1232)  dextrose 5 %-0.45 % sodium chloride infusion (  Intravenous Hold 12/16/19 1200)  dextrose 50 % solution 0-50 mL (has no administration in time range)  Barium Sulfate 2.1 % SUSP 450 mL (900 mLs Oral Given 12/16/19 1230)  lactated ringers bolus 1,000 mL (0 mLs Intravenous Stopped 12/16/19 1235)    Followed by  lactated ringers bolus 1,000 mL (0 mLs Intravenous Stopped 12/16/19 1235)  metoCLOPramide (REGLAN) injection 10 mg (10 mg Intravenous Given 12/16/19 1055)  fentaNYL (SUBLIMAZE) injection 50 mcg (50 mcg Intravenous Given 12/16/19 1057)  sodium chloride 0.9 % bolus 1,000 mL (1,000 mLs Intravenous Bolus 12/16/19 1415)    Followed by  sodium chloride 0.9 % bolus 1,000 mL (1,000 mLs Intravenous Bolus 12/16/19 1416)    ED Course  I have reviewed the triage vital signs and the nursing notes.  Pertinent labs & imaging results that were available during my care of the patient were reviewed by me and considered in my medical decision making (see chart for details).  Clinical Course as of Dec 15 1640  Tue Dec 16, 2019  1200 Corrects to 136 mEq/L Ron Parker, [phone]) or 142 [place], 1999).  Sodium(!): 124 [SJ]  1230 Patient states her abdominal pain has largely resolved.  Nausea resolved. We also discontinued the LR boluses in favor of normal saline boluses.   [SJ]  2122 Patient states she feels much better.  Her abdominal pain resolved and has not recurred.  With RN, Crystal, present we discussed the questionable position of her IUD as noted on the CT.  We further discussed the fact that she would need to use alternative birth control methods until placement of the IUD was confirmed by OB/GYN.  Patient acknowledges this information and notes she will have to act as if she has no current birth control in place until IUD position confirmed by OB/GYN. We also discussed the adrenal nodule noted on CT, stable from previous imaging. PCP follow up on this.   [SJ]    Clinical Course User Index [SJ] Master Touchet, Helane Gunther, PA-C   MDM Rules/Calculators/A&P                       Patient presents with abdominal pain and hyperglycemia. Patient is nontoxic appearing, afebrile, not tachypneic, not hypotensive, maintains excellent SPO2 on room air, and is in no apparent distress.  Mildly tachycardic.  No leukocytosis. Hyperglycemia noted without evidence of DKA.  No confusion or focal neurologic deficits.  Doubt HHS.  Symptoms and vital sign abnormalities resolved following IV fluids and insulin.  I have reviewed the patient's chart and it appears as though patient has a history of noncompliance, especially with her insulin.  About a month ago, she was placed on sliding scale insulin and her Levemir was increased from 20 units 3 times daily to 30 units 3 times daily.  Patient states she knew about this change, but states, "there was a mixup at the pharmacy and I was not able to get those medications."  She states she will follow-up with her PCP on this matter.  I reviewed and interpreted the patient's labs and radiological studies. No acute abnormalities found CT of the abdomen.   Incidental CT findings were discussed with the patient, as noted above in the ED course.  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  She has been advised to follow-up with her PCP on this matter.  The patient was given instructions for home care as well as return precautions. Patient voices understanding of these instructions, accepts the plan, and is comfortable with discharge.   Findings and plan of care discussed with Charlesetta Shanks, MD.   Vitals:   12/16/19 1415 12/16/19 1500 12/16/19 1530 12/16/19 1636  BP:  108/67 117/71 112/65  Pulse: (!) 106 (!) 102 84 91  Resp: '18 16 18 16  ' Temp:      TempSrc:      SpO2: 98% 98% 99% 99%  Weight:      Height:          Final Clinical Impression(s) / ED Diagnoses Final diagnoses:  Hyperglycemia    Rx / DC Orders ED Discharge Orders    None       Layla Maw 12/16/19 1649    Charlesetta Shanks, MD 12/19/19 1328

## 2019-12-16 NOTE — Discharge Instructions (Signed)
You have been seen today for hyperglycemia. You will need to be sure to take your insulin, as prescribed.  Please follow-up with your primary care provider this week on this matter.  You will need to have them resend your prescription for the sliding scale insulin. It was also recommended that you increase your Levemir dosing from 20 units to 30 units per dose.  The IUD was in a questionable position on the CT scan.  Please follow-up with OB/GYN as soon as possible in this manner to verify position.  Until then, use alternative birth control methods, such as condoms, as IUD may not be currently effective.

## 2019-12-16 NOTE — ED Triage Notes (Signed)
Patient is complaining of left upper abd that started last week but got more severe yesterday. Patient reports she vomited last week, but has been nausea with loss of appetite this week. Denies fever. Also, is a type 2 diabetic and her blood sugar monitor has been reading "high" since yesterday.

## 2020-01-11 ENCOUNTER — Other Ambulatory Visit: Payer: Self-pay

## 2020-01-11 ENCOUNTER — Encounter (HOSPITAL_COMMUNITY): Payer: Self-pay

## 2020-01-11 ENCOUNTER — Emergency Department (HOSPITAL_COMMUNITY): Payer: Medicaid Other

## 2020-01-11 ENCOUNTER — Emergency Department (HOSPITAL_COMMUNITY)
Admission: EM | Admit: 2020-01-11 | Discharge: 2020-01-12 | Disposition: A | Discharge status: Home / Self Care | Payer: Medicaid Other | Attending: Emergency Medicine | Admitting: Emergency Medicine

## 2020-01-11 DIAGNOSIS — Z794 Long term (current) use of insulin: Secondary | ICD-10-CM | POA: Insufficient documentation

## 2020-01-11 DIAGNOSIS — M545 Low back pain, unspecified: Secondary | ICD-10-CM

## 2020-01-11 DIAGNOSIS — F17210 Nicotine dependence, cigarettes, uncomplicated: Secondary | ICD-10-CM | POA: Insufficient documentation

## 2020-01-11 DIAGNOSIS — E1165 Type 2 diabetes mellitus with hyperglycemia: Secondary | ICD-10-CM | POA: Insufficient documentation

## 2020-01-11 DIAGNOSIS — Z20822 Contact with and (suspected) exposure to covid-19: Secondary | ICD-10-CM | POA: Insufficient documentation

## 2020-01-11 DIAGNOSIS — R109 Unspecified abdominal pain: Secondary | ICD-10-CM | POA: Diagnosis present

## 2020-01-11 DIAGNOSIS — Z86711 Personal history of pulmonary embolism: Secondary | ICD-10-CM | POA: Diagnosis not present

## 2020-01-11 DIAGNOSIS — R739 Hyperglycemia, unspecified: Secondary | ICD-10-CM

## 2020-01-11 LAB — URINALYSIS, ROUTINE W REFLEX MICROSCOPIC
Bilirubin Urine: NEGATIVE
Glucose, UA: >=500 mg/dL — AB
Hgb urine dipstick: NEGATIVE
Ketones, ur: 5 mg/dL — AB
Leukocytes,Ua: NEGATIVE
Nitrite: NEGATIVE
Protein, ur: NEGATIVE mg/dL
Specific Gravity, Urine: 1.03 (ref 1.005–1.030)
pH: 6 (ref 5.0–8.0)

## 2020-01-11 LAB — COMPREHENSIVE METABOLIC PANEL
ALT: 16 U/L (ref 0–44)
AST: 14 U/L — ABNORMAL LOW (ref 15–41)
Albumin: 3.4 g/dL — ABNORMAL LOW (ref 3.5–5.0)
Alkaline Phosphatase: 79 U/L (ref 38–126)
Anion gap: 11 (ref 5–15)
BUN: 21 mg/dL — ABNORMAL HIGH (ref 6–20)
CO2: 26 mmol/L (ref 22–32)
Calcium: 8.7 mg/dL — ABNORMAL LOW (ref 8.9–10.3)
Chloride: 89 mmol/L — ABNORMAL LOW (ref 98–111)
Creatinine, Ser: 0.7 mg/dL (ref 0.44–1.00)
GFR calc Af Amer: >60 mL/min (ref >60)
GFR calc non Af Amer: >60 mL/min (ref >60)
Glucose, Bld: 720 mg/dL (ref 70–99)
Potassium: 4.3 mmol/L (ref 3.5–5.1)
Sodium: 126 mmol/L — ABNORMAL LOW (ref 135–145)
Total Bilirubin: 0.6 mg/dL (ref 0.3–1.2)
Total Protein: 7.1 g/dL (ref 6.5–8.1)

## 2020-01-11 LAB — BLOOD GAS, VENOUS
Acid-Base Excess: 3.5 mmol/L — ABNORMAL HIGH (ref 0.0–2.0)
Bicarbonate: 28.3 mmol/L — ABNORMAL HIGH (ref 20.0–28.0)
O2 Saturation: 97.1 %
Patient temperature: 98.6
pCO2, Ven: 45.6 mmHg (ref 44.0–60.0)
pH, Ven: 7.41 (ref 7.250–7.430)
pO2, Ven: 88.8 mmHg — ABNORMAL HIGH (ref 32.0–45.0)

## 2020-01-11 LAB — CBC WITH DIFFERENTIAL/PLATELET
Abs Immature Granulocytes: 0.03 10*3/uL (ref 0.00–0.07)
Basophils Absolute: 0 10*3/uL (ref 0.0–0.1)
Basophils Relative: 0 %
Eosinophils Absolute: 0 10*3/uL (ref 0.0–0.5)
Eosinophils Relative: 1 %
HCT: 38.2 % (ref 36.0–46.0)
Hemoglobin: 12.4 g/dL (ref 12.0–15.0)
Immature Granulocytes: 0 %
Lymphocytes Relative: 12 %
Lymphs Abs: 0.9 10*3/uL (ref 0.7–4.0)
MCH: 28.3 pg (ref 26.0–34.0)
MCHC: 32.5 g/dL (ref 30.0–36.0)
MCV: 87.2 fL (ref 80.0–100.0)
Monocytes Absolute: 0.6 10*3/uL (ref 0.1–1.0)
Monocytes Relative: 8 %
Neutro Abs: 6.2 10*3/uL (ref 1.7–7.7)
Neutrophils Relative %: 79 %
Platelets: 328 10*3/uL (ref 150–400)
RBC: 4.38 MIL/uL (ref 3.87–5.11)
RDW: 12.9 % (ref 11.5–15.5)
WBC: 7.8 10*3/uL (ref 4.0–10.5)
nRBC: 0 % (ref 0.0–0.2)

## 2020-01-11 LAB — CBG MONITORING, ED: Glucose-Capillary: >600 mg/dL (ref 70–99)

## 2020-01-11 LAB — POC SARS CORONAVIRUS 2 AG -  ED: SARS Coronavirus 2 Ag: NEGATIVE

## 2020-01-11 LAB — I-STAT BETA HCG BLOOD, ED (MC, WL, AP ONLY): I-stat hCG, quantitative: <5 m[IU]/mL (ref <5)

## 2020-01-11 LAB — LIPASE, BLOOD: Lipase: 36 U/L (ref 11–51)

## 2020-01-11 MED ORDER — LACTATED RINGERS IV BOLUS
1000.0000 mL | Freq: Once | INTRAVENOUS | Status: AC
  Administered 2020-01-12: 1000 mL via INTRAVENOUS

## 2020-01-11 MED ORDER — LACTATED RINGERS IV BOLUS
1000.0000 mL | Freq: Once | INTRAVENOUS | Status: AC
  Administered 2020-01-11: 1000 mL via INTRAVENOUS

## 2020-01-11 MED ORDER — HYDROMORPHONE HCL 1 MG/ML IJ SOLN
1.0000 mg | Freq: Once | INTRAMUSCULAR | Status: AC
  Administered 2020-01-11: 1 mg via INTRAVENOUS
  Filled 2020-01-11: qty 1

## 2020-01-11 NOTE — ED Notes (Signed)
Urine and Culture sent down to lab

## 2020-01-11 NOTE — ED Provider Notes (Signed)
[facility] DEPT Provider Note   CSN: [id number] Arrival date & time: 01/11/20  2104     History Chief Complaint  Patient presents with  . Flank Pain  . Hyperglycemia    Jodi Mcgee is a 34 y.o. female.  HPI 34 year old female presents with hyperglycemia and left flank pain.  Both of these started about 2 days ago.  Her glucometer has been reading high for the last couple days despite taking insulin.  She has not noticed a fever but has had severe left flank pain.  Starts in her back and is also in her flank.  Today she has noticed it going into her buttocks.  No weakness or numbness in her lower extremities.  No vomiting or diarrhea.  She states she has been having urinary frequency and some abdominal discomfort when urinating.  Not sure if this is from the hyperglycemia.  No blood in the urine.  Has never had this flank pain before with her hyperglycemia.  She is tried ibuprofen and Tylenol without relief. Pain is 10.  She has chronic trouble holding her bladder if she gets really full but otherwise has not noticed any incontinence and no bowel incontinence.  Patient also notes a little sore throat today.  Thinks it was from hot tea she drank.  Denies cough.   Past Medical History:  Diagnosis Date  . Anemia   . Anxiety   . Blood transfusion without reported diagnosis    both CS  . Diabetes mellitus without complication ([place])   . GERD (gastroesophageal reflux disease)    has resolved  . IBS (irritable bowel syndrome)   . Ovarian cyst   . Peritonitis, acute generalized ([place])   . Pulmonary embolism ([place])   . Sepsis [facility])     Patient Active Problem List   Diagnosis Date Noted  . Bartholin's gland abscess 02/21/2019  . DKA (diabetic ketoacidoses) ([place]) 02/20/2019  . Blood per rectum 02/20/2019  . Hyponatremia 09/26/2018  . Chronic hypertension 01/03/2018  . IUD contraception 01/03/2018  . Pulmonary embolism during puerperium 12/04/2017    . Pulmonary edema 11/30/2017  . Pelvic adhesive disease 10/22/2017  . Nasal septal perforation 10/17/2017  . History of cocaine abuse ([place]) 10/17/2017  . Epistaxis 10/17/2017  . 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 Obesity (BMI 30-39.9) 09/25/2017  . Type 2 diabetes mellitus, without long-term current use of insulin ([place]) 08/23/2017  . Chronic pain syndrome 08/23/2017    Past Surgical History:  Procedure Laterality Date  . APPENDECTOMY     ruptured   . BUNIONECTOMY    . CESAREAN SECTION    . CESAREAN SECTION Bilateral 11/20/2017   Procedure: CESAREAN SECTION;  Surgeon: Sloan Leiter, MD;  Location: [facility];  Service: Obstetrics;  Laterality: Bilateral;  . OVARIAN CYST DRAINAGE    . SMALL BOWEL REPAIR       OB History    Gravida  3   Para  3   Term  1   Preterm  2   AB  0   Living  3     SAB  0   TAB  0   Ectopic  0   Multiple  0   Live Births  3           Family History  Problem Relation Age of Onset  . Hypertension Mother   . Anemia Mother   . Diabetes Father     Social History  Tobacco Use  . Smoking status: Light Tobacco Smoker    Packs/day: 0.50    Types: Cigarettes  . Smokeless tobacco: Never Used  . Tobacco comment: social use with tobacco  Substance Use Topics  . Alcohol use: Yes    Comment: 3 times a month  . Drug use: Not Currently    Home Medications Prior to Admission medications   Medication Sig Start Date End Date Taking? Authorizing Provider  cyclobenzaprine (FLEXERIL) 10 MG tablet Take 1 tablet (10 mg total) by mouth 3 (three) times daily as needed for muscle spasms. 07/18/19  Yes Anyanwu, Sallyanne Havers, MD  insulin detemir (LEVEMIR) 100 unit/ml SOLN Inject 0.2 mLs (20 Units total) into the skin 2 (two) times daily. 05/17/19  Yes Scot Jun, FNP  JARDIANCE 10 MG TABS tablet Take 10 mg by mouth at bedtime.  07/02/19  Yes [provider]  Levonorgestrel (LILETTA) 19.5 MCG/DAY IUD IUD 1 each by  Intrauterine route continuous. Placed in 2019   Yes [provider]  metFORMIN (GLUCOPHAGE) 1000 MG tablet Take 1 tablet (1,000 mg total) by mouth 2 (two) times daily with a meal. 05/17/19  Yes Scot Jun, FNP  oxyCODONE-acetaminophen (PERCOCET/ROXICET) 5-325 MG tablet Take 1 tablet by mouth every 4 (four) hours as needed for severe pain. 07/18/19  Yes Anyanwu, Sallyanne Havers, MD  blood glucose meter kit and supplies KIT Dispense based on patient and insurance preference. Use up to four times daily as directed. (FOR ICD-9 250.00, 250.01). 02/21/19   Oretha Milch D, MD  glipiZIDE (GLUCOTROL) 5 MG tablet Take 1 tablet (5 mg total) by mouth 2 (two) times daily before a meal. Patient not taking: Reported on 12/16/2019 05/17/19   Scot Jun, FNP    Allergies    Cetirizine & related, Toradol [ketorolac tromethamine], Flagyl [metronidazole], Contrast media [iodinated diagnostic agents], and Nsaids  Review of Systems   Review of Systems  Constitutional: Negative for fever.  HENT: Positive for sore throat.   Respiratory: Negative for cough and shortness of breath.   Cardiovascular: Negative for chest pain.  Gastrointestinal: Negative for abdominal pain, diarrhea and vomiting.  Genitourinary: Positive for dysuria, flank pain and frequency. Negative for hematuria.  Musculoskeletal: Positive for back pain.  Neurological: Negative for weakness and numbness.  All other systems reviewed and are negative.   Physical Exam Updated Vital Signs BP 131/79 (BP Location: Left Arm)   Pulse (!) 119   Temp 98.1 F (36.7 C) (Oral)   Resp 16   Ht '5\' 7"'$  (1.702 m)   Wt 85.7 kg   SpO2 99%   BMI 29.60 kg/m   Physical Exam Vitals and nursing note reviewed.  Constitutional:      Appearance: She is well-developed.  HENT:     Head: Normocephalic and atraumatic.     Right Ear: External ear normal.     Left Ear: External ear normal.     Nose: Nose normal.  Eyes:     General:        Right  eye: No discharge.        Left eye: No discharge.  Cardiovascular:     Rate and Rhythm: Regular rhythm. Tachycardia present.     Heart sounds: Normal heart sounds.  Pulmonary:     Effort: Pulmonary effort is normal.     Breath sounds: Normal breath sounds.  Abdominal:     Palpations: Abdomen is soft.     Tenderness: There is no abdominal tenderness. There is  left CVA tenderness. There is no right CVA tenderness.  Musculoskeletal:     Thoracic back: Tenderness present.     Lumbar back: Tenderness present.       Back:  Skin:    General: Skin is warm and dry.  Neurological:     Mental Status: She is alert.     Comments: 5/5 strength in BLE. Normal gross sensation. Normal gait  Psychiatric:        Mood and Affect: Mood is not anxious.     ED Results / Procedures / Treatments   Labs (all labs ordered are listed, but only abnormal results are displayed) Labs Reviewed  URINALYSIS, ROUTINE W REFLEX MICROSCOPIC - Abnormal; Notable for the following components:      Result Value   Color, Urine STRAW (*)    Glucose, UA >=500 (*)    Ketones, ur 5 (*)    Bacteria, UA RARE (*)    All other components within normal limits  COMPREHENSIVE METABOLIC PANEL - Abnormal; Notable for the following components:   Sodium 126 (*)    Chloride 89 (*)    Glucose, Bld 720 (*)    BUN 21 (*)    Calcium 8.7 (*)    Albumin 3.4 (*)    AST 14 (*)    All other components within normal limits  BLOOD GAS, VENOUS - Abnormal; Notable for the following components:   pO2, Ven 88.8 (*)    Bicarbonate 28.3 (*)    Acid-Base Excess 3.5 (*)    All other components within normal limits  CBG MONITORING, ED - Abnormal; Notable for the following components:   Glucose-Capillary >600 (*)    All other components within normal limits  URINE CULTURE  LIPASE, BLOOD  CBC WITH DIFFERENTIAL/PLATELET  CBG MONITORING, ED  I-STAT BETA HCG BLOOD, ED ([facility], [facility], [facility] ONLY)  POC SARS CORONAVIRUS 2 AG -  ED     EKG None  Radiology CT Renal Stone Study  Result Date: 01/11/2020 CLINICAL DATA:  Left-sided flank pain EXAM: CT ABDOMEN AND PELVIS WITHOUT CONTRAST TECHNIQUE: Multidetector CT imaging of the abdomen and pelvis was performed following the standard protocol without IV contrast. COMPARISON:  December 16, 2019 FINDINGS: Lower chest: The visualized heart size within normal limits. No pericardial fluid/thickening. No hiatal hernia. The visualized portions of the lungs are clear. Hepatobiliary: Although limited due to the lack of intravenous contrast, normal in appearance without gross focal abnormality. No evidence of calcified gallstones or biliary ductal dilatation. Pancreas:  Unremarkable.  No surrounding inflammatory changes. Spleen: Normal in size. Although limited due to the lack of intravenous contrast, normal in appearance. 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 The kidneys and collecting system appear normal without evidence of urinary tract calculus or hydronephrosis. Bladder is unremarkable. Stomach/Bowel: The stomach, small bowel, and colon are normal in appearance. No inflammatory changes or obstructive findings. There is a moderate amount of colonic stool present. Vascular/Lymphatic: There are no enlarged abdominal or pelvic lymph nodes. No significant gross vascular findings are present. Reproductive: IUD is again noted within the uterine fundus. The adnexa are unremarkable. Other: No evidence of abdominal wall mass or hernia. Musculoskeletal: No acute or significant osseous findings. IMPRESSION: No acute intra-abdominal or pelvic pathology to explain the patient's symptoms. Moderate colonic stool without evidence of obstruction. Electronically Signed   By: Ebony Cargo.D.  On: 01/11/2020 23:32    Procedures Procedures (including critical care  time)  Medications Ordered in ED Medications  lactated ringers bolus 1,000 mL (has no administration in time range)  insulin aspart (novoLOG) injection 5 Units (has no administration in time range)  HYDROmorphone (DILAUDID) injection 1 mg (1 mg Intravenous Given 01/11/20 2224)  lactated ringers bolus 1,000 mL (1,000 mLs Intravenous New Bag/Given 01/11/20 2224)    ED Course  I have reviewed the triage vital signs and the nursing notes.  Pertinent labs & imaging results that were available during my care of the patient were reviewed by me and considered in my medical decision making (see chart for details).    MDM Rules/Calculators/A&P                      Patient's lab work reveals significant hyperglycemia but no acidosis.  Her back pain is probably muscular given negative work-up with CT scan and negative urinalysis.  She does not have any focal neuro deficits and my suspicion of acute spinal cord emergency is pretty low.  Will give fluids and insulin.  Assuming her glucose improved significantly, I think she can be discharged. Care to Dr. Tomi Bamberger. Final Clinical Impression(s) / ED Diagnoses Final diagnoses:  Hyperglycemia  Acute left-sided low back pain, unspecified whether sciatica present    Rx / DC Orders ED Discharge Orders    None       Sherwood Gambler, MD 01/12/20 0030

## 2020-01-11 NOTE — ED Triage Notes (Signed)
Pt states that since Friday she has been experiencing left flank pain. Pt denies N/V/D, last BM was this morning. Pt states her CBG machine has also been reading HIGH over the last 2 days, pt states she is compliant with her Insulin. Pt is A&Ox4.

## 2020-01-11 NOTE — ED Notes (Signed)
Date and time results received: 01/11/20 11:09 PM  (use smartphrase ".now" to insert current time)  Test: Glucose Critical Value: 720  Name of Provider Notified:Dr.Goldston Orders Received? Or Actions Taken?:

## 2020-01-12 LAB — URINE CULTURE: Culture: <10000 — AB

## 2020-01-12 LAB — CBG MONITORING, ED: Glucose-Capillary: 346 mg/dL — ABNORMAL HIGH (ref 70–99)

## 2020-01-12 MED ORDER — CYCLOBENZAPRINE HCL 10 MG PO TABS
5.0000 mg | ORAL_TABLET | Freq: Once | ORAL | Status: AC
  Administered 2020-01-12: 5 mg via ORAL
  Filled 2020-01-12: qty 1

## 2020-01-12 MED ORDER — CYCLOBENZAPRINE HCL 5 MG PO TABS: 5.0000 mg | ORAL_TABLET | Freq: Three times a day (TID) | ORAL | 0 refills | Status: DC | PRN

## 2020-01-12 MED ORDER — INSULIN ASPART 100 UNIT/ML ~~LOC~~ SOLN
5.0000 [IU] | Freq: Once | SUBCUTANEOUS | Status: AC
  Administered 2020-01-12: 5 [IU] via INTRAVENOUS
  Filled 2020-01-12: qty 0.05

## 2020-01-12 MED ORDER — INSULIN ASPART 100 UNIT/ML ~~LOC~~ SOLN
5.0000 [IU] | Freq: Once | SUBCUTANEOUS | Status: AC
  Administered 2020-01-12: 5 [IU] via SUBCUTANEOUS
  Filled 2020-01-12: qty 0.05

## 2020-01-12 NOTE — ED Provider Notes (Signed)
Patient was left at change of shift to recheck her CBG after getting IV fluids and IV insulin.  At 2:55 AM her CBG had improved to 346 from 720 at 10 PM.  Patient states she has both diabetic pills and insulin that she takes at home.  We discussed giving her a small dose of subcu insulin here and then she will manage her blood sugar at home.  She is going on about her pain in her left side.  She was given Flexeril orally.  Evidently she has a Toradol and nonsteroidal anti-inflammatory drug allergy.  She had a CT renal done which did not show any acute abnormalities.  We discussed her pain is probably musculoskeletal.  CT ABDOMEN PELVIS WO CONTRAST  Result Date: 12/16/2019 CLINICAL DATA:  Upper abdominal pain for week. Nausea. Suspicion of pancreatitis. Diabetes.IMPRESSION: 1.  No acute process in the abdomen or pelvis. 2. 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 3. Intrauterine device, positioned entirely within the uterine fundus, without extension towards the endocervical canal. Correlate with string positioning. 4.  Possible constipation. Electronically Signed   By: Abigail Miyamoto M.D.   On: 12/16/2019 15:07   CT Renal Stone Study  Result Date: 01/11/2020 CLINICAL DATA:  Left-sided flank pain EXAM: CT ABDOMEN AND PELVIS WITHOUT CONTRAST TECHNIQUE: Multidetector CT imaging of the abdomen and pelvis was performed following the standard protocol without IV contrast. COMPARISON:  December 16, 2019 FINDINGS: Lower chest: The visualized heart size within normal limits. No pericardial fluid/thickening. No hiatal hernia. The visualized portions of the lungs are clear. Hepatobiliary: Although limited due to the lack of intravenous contrast, normal in appearance without gross focal abnormality. No evidence of calcified gallstones or biliary ductal dilatation. Pancreas:  Unremarkable.  No surrounding inflammatory changes. Spleen: Normal in size. Although limited due to the  lack of intravenous contrast, normal in appearance. 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 The kidneys and collecting system appear normal without evidence of urinary tract calculus or hydronephrosis. Bladder is unremarkable. Stomach/Bowel: The stomach, small bowel, and colon are normal in appearance. No inflammatory changes or obstructive findings. There is a moderate amount of colonic stool present. Vascular/Lymphatic: There are no enlarged abdominal or pelvic lymph nodes. No significant gross vascular findings are present. Reproductive: IUD is again noted within the uterine fundus. The adnexa are unremarkable. Other: No evidence of abdominal wall mass or hernia. Musculoskeletal: No acute or significant osseous findings. IMPRESSION: No acute intra-abdominal or pelvic pathology to explain the patient's symptoms. Moderate colonic stool without evidence of obstruction. Electronically Signed   By: Prudencio Pair M.D.   On: 01/11/2020 23:32    Diagnoses that have been ruled out:  None  Diagnoses that are still under consideration:  None  Final diagnoses:  Hyperglycemia  Acute left-sided low back pain, unspecified whether sciatica present   ED Discharge Orders         Ordered    cyclobenzaprine (FLEXERIL) 5 MG tablet  3 times daily PRN     01/12/20 0328         Plan discharge  Rolland Porter, MD, Barbette Or, MD 01/12/20 0330

## 2020-01-12 NOTE — Discharge Instructions (Addendum)
Use ice and heat for comfort over the painful area.  Please monitor your blood sugar closely.  Take the Flexeril as prescribed for the pain.  If this is not helping your pain please have your doctor reevaluate you.  Your CT scan tonight does not show any active process going on that could cause your pain such as a kidney stone, inflammation of the intestines, pneumonia.

## 2020-02-02 ENCOUNTER — Encounter (HOSPITAL_COMMUNITY): Payer: Self-pay | Admitting: Emergency Medicine

## 2020-02-02 ENCOUNTER — Other Ambulatory Visit: Payer: Self-pay

## 2020-02-02 ENCOUNTER — Emergency Department (HOSPITAL_COMMUNITY)
Admission: EM | Admit: 2020-02-02 | Discharge: 2020-02-02 | Disposition: A | Discharge status: Home / Self Care | Payer: Medicaid Other | Attending: Emergency Medicine | Admitting: Emergency Medicine

## 2020-02-02 DIAGNOSIS — Z5321 Procedure and treatment not carried out due to patient leaving prior to being seen by health care provider: Secondary | ICD-10-CM | POA: Insufficient documentation

## 2020-02-02 DIAGNOSIS — R1110 Vomiting, unspecified: Secondary | ICD-10-CM | POA: Insufficient documentation

## 2020-02-02 DIAGNOSIS — R197 Diarrhea, unspecified: Secondary | ICD-10-CM | POA: Insufficient documentation

## 2020-02-02 DIAGNOSIS — K0889 Other specified disorders of teeth and supporting structures: Secondary | ICD-10-CM | POA: Insufficient documentation

## 2020-02-02 DIAGNOSIS — E1165 Type 2 diabetes mellitus with hyperglycemia: Secondary | ICD-10-CM | POA: Diagnosis not present

## 2020-02-02 MED ORDER — SODIUM CHLORIDE 0.9% FLUSH: 3.0000 mL | Freq: Once | INTRAVENOUS | Status: DC

## 2020-02-02 NOTE — ED Notes (Signed)
Called for pt x2 in ED lobby re: labs- no answer. [employer]

## 2020-02-02 NOTE — ED Notes (Signed)
Called for pt x3 in ED lobby re: labs- no answer. RN advised. [employer]

## 2020-02-02 NOTE — ED Triage Notes (Signed)
Pt c/o right dental pain and swelling x week. V/d and feeling really bad, sugars been running high x 4-5 days.

## 2020-02-02 NOTE — ED Notes (Signed)
Pt called in ED lobby for labs x1, no answer. [employer]

## 2020-03-11 IMAGING — US ARTERIAL AND VENOUS ULTRASOUND OF THE ABDOMEN PELVIS AND SCROTUM
1 series · 13 of 25 positions shown · non-contrast
Comparison: Pelvic ultrasound 2 days prior (02/23/2019

CLINICAL DATA: RIGHT adnexal pain.  Prior Caesarean section.

EXAM:
TRANSABDOMINAL ULTRASOUND OF PELVIS
DOPPLER ULTRASOUND OF OVARIES
TECHNIQUE: Transabdominal ultrasound examination of the pelvis was performed
including evaluation of the uterus, ovaries, adnexal regions, and
pelvic cul-de-sac.
Color and duplex Doppler ultrasound was utilized to evaluate blood
flow to the ovaries.

[Series 1: arterial and venous ultrasound of the abdomen pelv · 73 acquisitions, 13 frames shown]
[im 1/73]
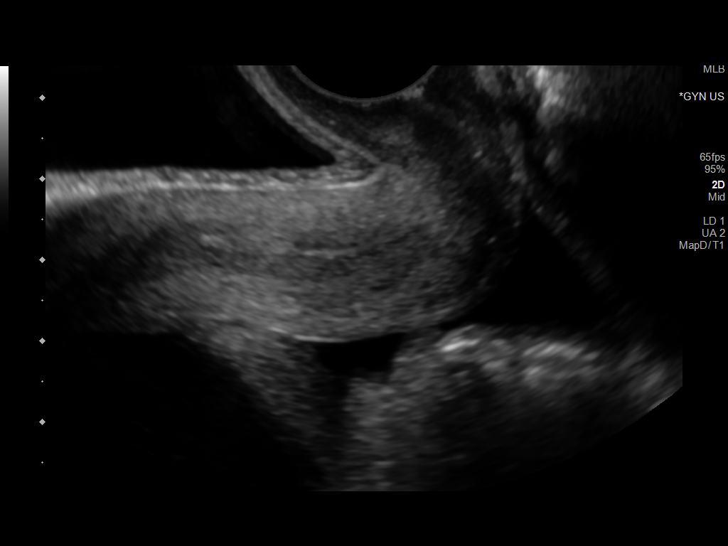
[im 7/73]
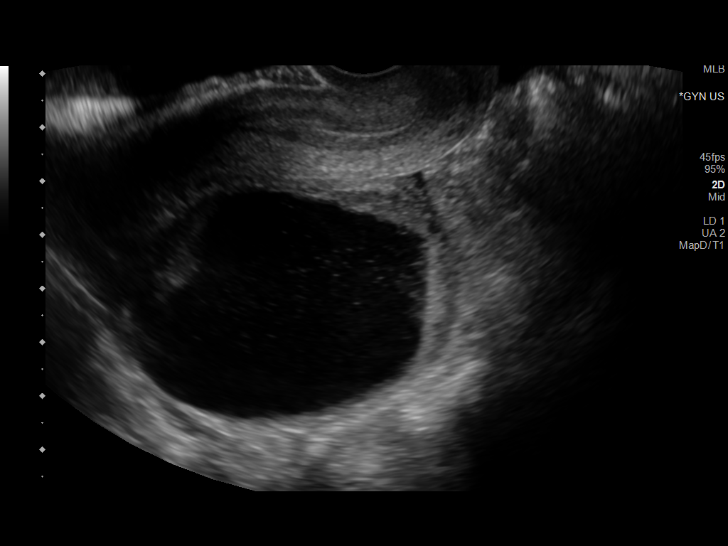
[im 13/73]
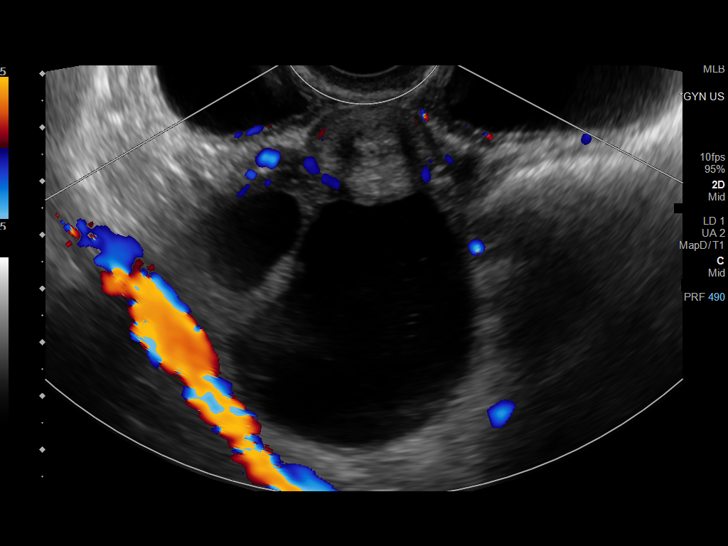
[im 19/73]
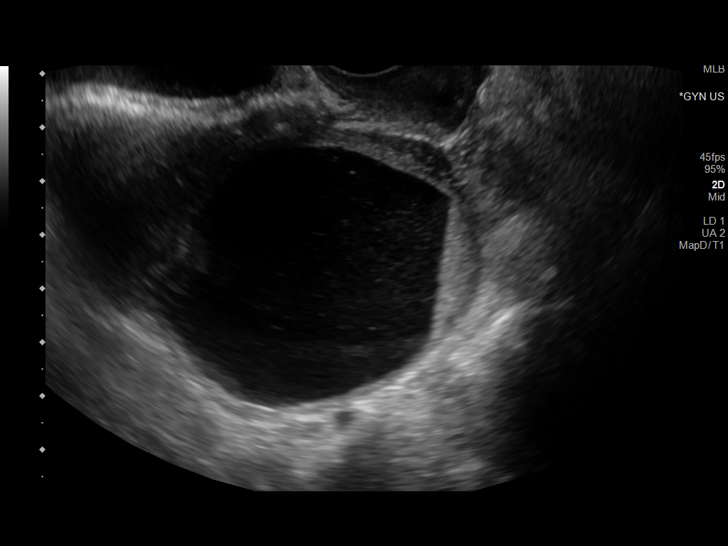
[im 25/73]
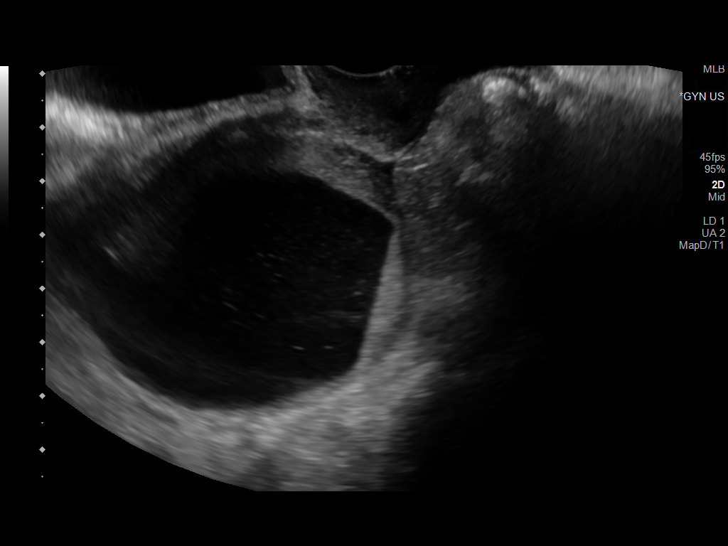
[im 31/73]
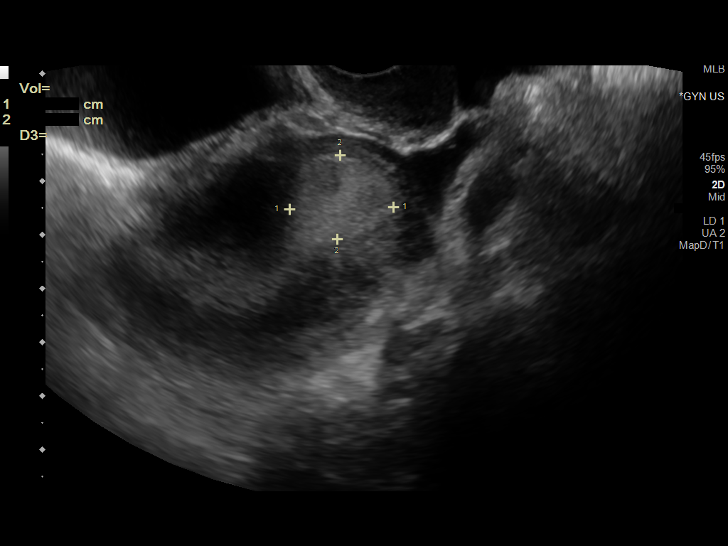
[im 37/73]
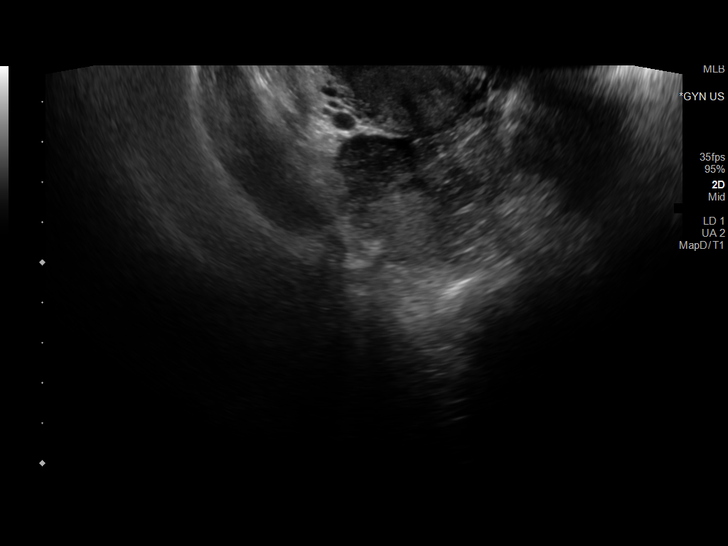
[im 43/73]
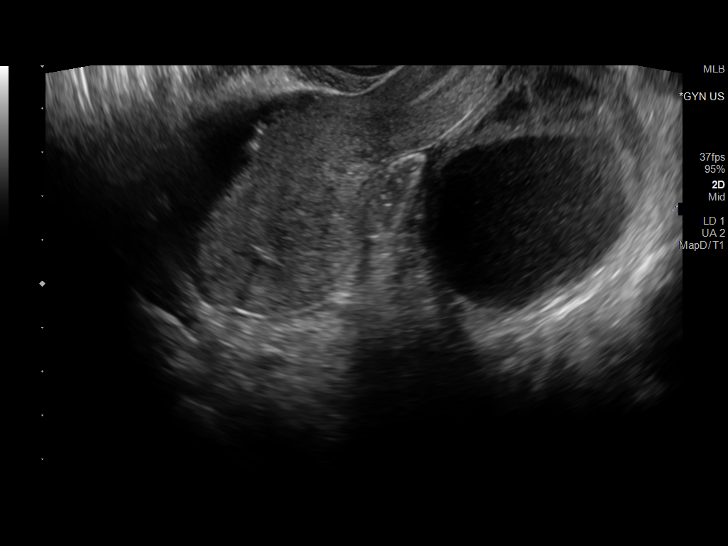
[im 49/73]
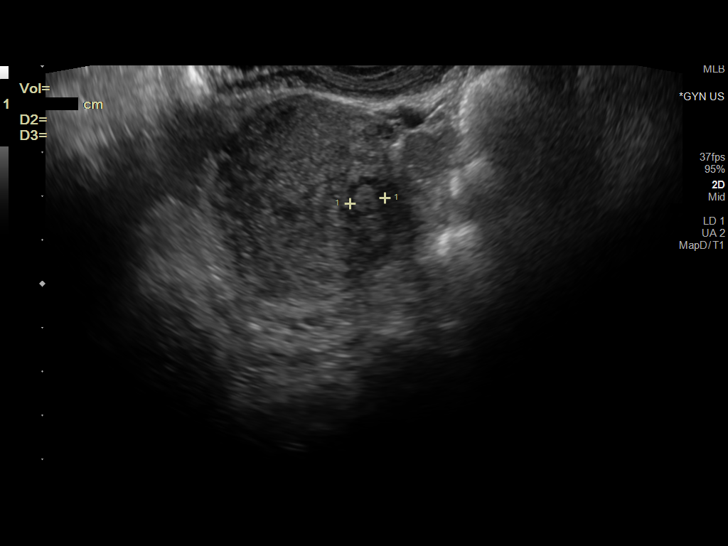
[im 55/73]
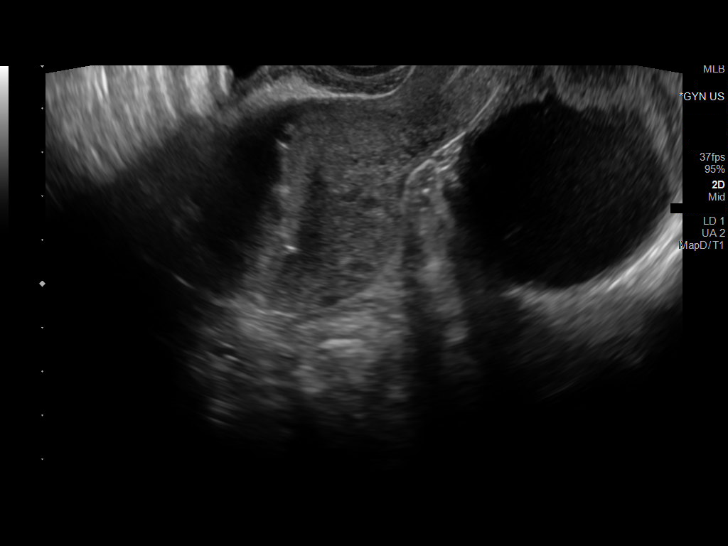
[im 61/73]
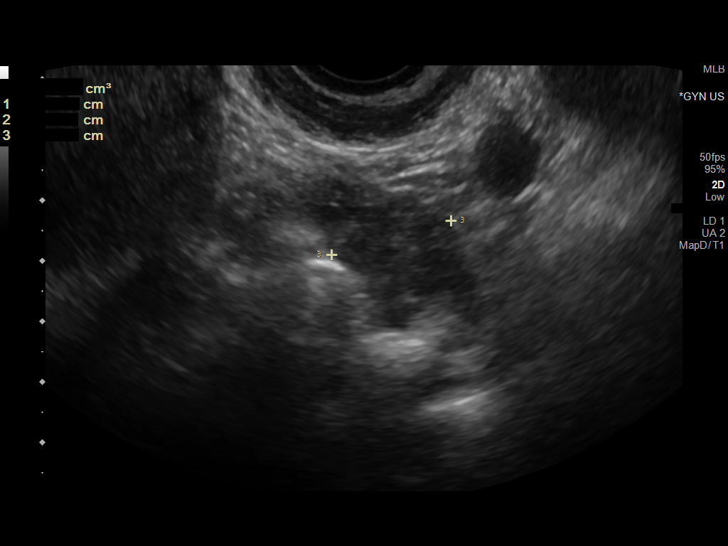
[im 67/73]
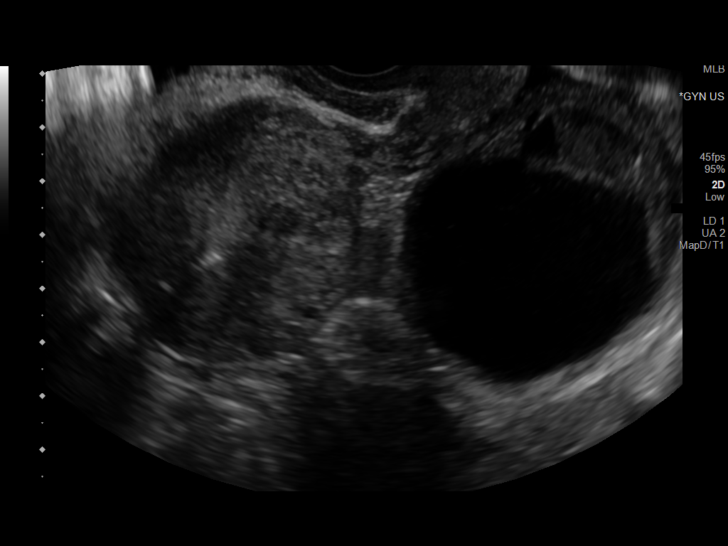
[im 73/73]
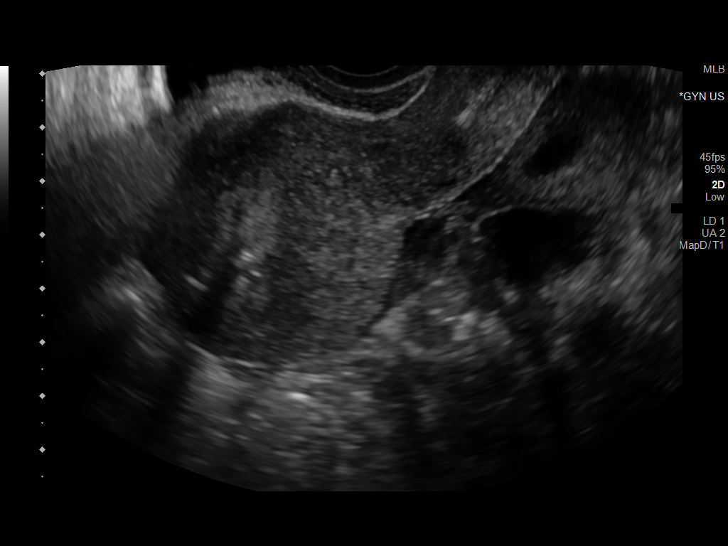

[13 of 25 positions shown; findings below may reference images not displayed]

FINDINGS: Uterus

Measurements: Normal in size and echogenicity measuring 8.7 x 4.3 x
4.7 cm. = volume: 87 mL. No fibroids or other mass visualized.

Endometrium

Thickness: Normal for premenopausal female at 12 mm. IUD noted. No
focal abnormality visualized.

Right ovary

Measurements: 6.0 x 4.9 x 5.6 cm = volume: 86 mL. Large mildly
anechoic cystic structure within the RIGHT ovary measures 5.7 x
x 4.9 cm. There are a fine internal echoes within the is cystic
lesion. Mild nodularity along 1 of the cyst walls with 3 mm cyst
wall nodules. There is no internal enhancement or nodularity
identified. No significant vascularity.

Left ovary

Measurements: 2.7 x 2.3 x 2.1 cm = volume: 6.7 mL. Normal
appearance/no adnexal mass.

Pulsed Doppler evaluation demonstrates normal low-resistance
arterial and venous waveforms in both ovaries.

Other: Trace free fluid
IMPRESSION: 1. No evidence of ovarian torsion.
2. Large cystic lesion within the RIGHT kidney likely represents a
hemorrhagic ovarian cyst. With small nodules along cyst wall, cystic
ovarian neoplasm warrants exclusion. Due to size of lesion,
recommend follow-up ultrasound in 6-12 weeks. This recommendation
follows the consensus statement: Management of Asymptomatic Ovarian
and Other Adnexal Cysts Imaged at US: Society of Radiologists in
Ultrasound Consensus Conference Statement. Radiology 7151;
3. Normal LEFT ovary.
4. Normal uterus with IUD noted.

## 2020-05-02 ENCOUNTER — Emergency Department (HOSPITAL_BASED_OUTPATIENT_CLINIC_OR_DEPARTMENT_OTHER)
Admission: EM | Admit: 2020-05-02 | Discharge: 2020-05-02 | Disposition: A | Discharge status: Home / Self Care | Payer: Medicaid Other | Attending: Emergency Medicine | Admitting: Emergency Medicine

## 2020-05-02 ENCOUNTER — Encounter (HOSPITAL_BASED_OUTPATIENT_CLINIC_OR_DEPARTMENT_OTHER): Payer: Self-pay | Admitting: Emergency Medicine

## 2020-05-02 ENCOUNTER — Other Ambulatory Visit: Payer: Self-pay

## 2020-05-02 DIAGNOSIS — Z8522 Personal history of malignant neoplasm of nasal cavities, middle ear, and accessory sinuses: Secondary | ICD-10-CM | POA: Diagnosis not present

## 2020-05-02 DIAGNOSIS — Z794 Long term (current) use of insulin: Secondary | ICD-10-CM | POA: Insufficient documentation

## 2020-05-02 DIAGNOSIS — I10 Essential (primary) hypertension: Secondary | ICD-10-CM | POA: Diagnosis not present

## 2020-05-02 DIAGNOSIS — M25552 Pain in left hip: Secondary | ICD-10-CM | POA: Insufficient documentation

## 2020-05-02 DIAGNOSIS — E1110 Type 2 diabetes mellitus with ketoacidosis without coma: Secondary | ICD-10-CM | POA: Diagnosis not present

## 2020-05-02 DIAGNOSIS — R739 Hyperglycemia, unspecified: Secondary | ICD-10-CM

## 2020-05-02 DIAGNOSIS — M545 Low back pain, unspecified: Secondary | ICD-10-CM

## 2020-05-02 LAB — URINALYSIS, ROUTINE W REFLEX MICROSCOPIC
Bilirubin Urine: NEGATIVE
Glucose, UA: >=500 mg/dL — AB
Hgb urine dipstick: NEGATIVE
Ketones, ur: NEGATIVE mg/dL
Leukocytes,Ua: NEGATIVE
Nitrite: NEGATIVE
Protein, ur: NEGATIVE mg/dL
Specific Gravity, Urine: 1.02 (ref 1.005–1.030)
pH: 5.5 (ref 5.0–8.0)

## 2020-05-02 LAB — URINALYSIS, MICROSCOPIC (REFLEX)

## 2020-05-02 LAB — CBG MONITORING, ED: Glucose-Capillary: 447 mg/dL — ABNORMAL HIGH (ref 70–99)

## 2020-05-02 MED ORDER — INSULIN ASPART 100 UNIT/ML ~~LOC~~ SOLN
15.0000 [IU] | Freq: Once | SUBCUTANEOUS | Status: AC
  Administered 2020-05-02: 15 [IU] via INTRAVENOUS
  Filled 2020-05-02: qty 15

## 2020-05-02 MED ORDER — TRAMADOL HCL 50 MG PO TABS: 50.0000 mg | ORAL_TABLET | Freq: Four times a day (QID) | ORAL | 0 refills | Status: DC | PRN

## 2020-05-02 MED ORDER — OXYCODONE-ACETAMINOPHEN 5-325 MG PO TABS
1.0000 | ORAL_TABLET | Freq: Once | ORAL | Status: AC
  Administered 2020-05-02: 1 via ORAL
  Filled 2020-05-02: qty 1

## 2020-05-02 MED ORDER — METHOCARBAMOL 500 MG PO TABS: 500.0000 mg | ORAL_TABLET | Freq: Three times a day (TID) | ORAL | 0 refills | Status: DC | PRN

## 2020-05-02 NOTE — ED Provider Notes (Signed)
[facility] EMERGENCY DEPARTMENT Provider Note   CSN: [id number] Arrival date & time: 05/02/20  0548     History Chief Complaint  Patient presents with  . Back Pain    Jodi Mcgee is a 34 y.o. female.  Patient presents to the emergency department for evaluation of left-sided low back pain.  Patient reports that she just got back from a church trip.  She reports that she had a ride on a bus for the trip and started to notice the pain while on the bus.  Pain has progressively worsened.  Patient reports a sharp pain in the left lower back that radiates up and down the back and into the left hip.  She did notice a small amount of blood in her urine but has not had any dysuria or urinary frequency.  No history of kidney stones.  She tried Tylenol but it did not help her pain.        Past Medical History:  Diagnosis Date  . Anemia   . Anxiety   . Blood transfusion without reported diagnosis    both CS  . Diabetes mellitus without complication ([place])   . GERD (gastroesophageal reflux disease)    has resolved  . IBS (irritable bowel syndrome)   . Ovarian cyst   . Peritonitis, acute generalized ([place])   . Pulmonary embolism ([place])   . Sepsis [facility])     Patient Active Problem List   Diagnosis Date Noted  . Bartholin's gland abscess 02/21/2019  . DKA (diabetic ketoacidoses) ([place]) 02/20/2019  . Blood per rectum 02/20/2019  . Hyponatremia 09/26/2018  . Chronic hypertension 01/03/2018  . IUD contraception 01/03/2018  . Pulmonary embolism during puerperium 12/04/2017  . Pulmonary edema 11/30/2017  . Pelvic adhesive disease 10/22/2017  . Nasal septal perforation 10/17/2017  . History of cocaine abuse ([place]) 10/17/2017  . Epistaxis 10/17/2017  . 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 Obesity (BMI 30-39.9) 09/25/2017  . Type 2 diabetes mellitus, without long-term current use of insulin ([place]) 08/23/2017  . Chronic pain syndrome 08/23/2017    Past Surgical  History:  Procedure Laterality Date  . APPENDECTOMY     ruptured   . BUNIONECTOMY    . CESAREAN SECTION    . CESAREAN SECTION Bilateral 11/20/2017   Procedure: CESAREAN SECTION;  Surgeon: Sloan Leiter, MD;  Location: [place];  Service: Obstetrics;  Laterality: Bilateral;  . OVARIAN CYST DRAINAGE    . SMALL BOWEL REPAIR       OB History    Gravida  3   Para  3   Term  1   Preterm  2   AB  0   Living  3     SAB  0   TAB  0   Ectopic  0   Multiple  0   Live Births  3           Family History  Problem Relation Age of Onset  . Hypertension Mother   . Anemia Mother   . Diabetes Father     Social History   Tobacco Use  . Smoking status: Light Tobacco Smoker    Packs/day: 0.50    Types: Cigarettes  . Smokeless tobacco: Never Used  . Tobacco comment: social use with tobacco  Vaping Use  . Vaping Use: Never used  Substance Use Topics  . Alcohol use: Yes    Comment: 3 times a month  . Drug use: Not Currently  Home Medications Prior to Admission medications   Medication Sig Start Date End Date Taking? Authorizing Provider  blood glucose meter kit and supplies KIT Dispense based on patient and insurance preference. Use up to four times daily as directed. (FOR ICD-9 250.00, 250.01). 02/21/19   Oretha Milch D, MD  cyclobenzaprine (FLEXERIL) 5 MG tablet Take 1 tablet (5 mg total) by mouth 3 (three) times daily as needed (muscle soreness). 01/12/20   Rolland Porter, MD  glipiZIDE (GLUCOTROL) 5 MG tablet Take 1 tablet (5 mg total) by mouth 2 (two) times daily before a meal. Patient not taking: Reported on 12/16/2019 05/17/19   Scot Jun, FNP  insulin detemir (LEVEMIR) 100 unit/ml SOLN Inject 0.2 mLs (20 Units total) into the skin 2 (two) times daily. 05/17/19   Scot Jun, FNP  JARDIANCE 10 MG TABS tablet Take 10 mg by mouth at bedtime.  07/02/19   [provider]  Levonorgestrel (LILETTA) 19.5 MCG/DAY IUD IUD 1 each by Intrauterine  route continuous. Placed in 2019    [provider]  metFORMIN (GLUCOPHAGE) 1000 MG tablet Take 1 tablet (1,000 mg total) by mouth 2 (two) times daily with a meal. 05/17/19   Scot Jun, FNP  oxyCODONE-acetaminophen (PERCOCET/ROXICET) 5-325 MG tablet Take 1 tablet by mouth every 4 (four) hours as needed for severe pain. 07/18/19   Osborne Oman, MD    Allergies    Cetirizine & related, Toradol [ketorolac tromethamine], Flagyl [metronidazole], Contrast media [iodinated diagnostic agents], and Nsaids  Review of Systems   Review of Systems  Musculoskeletal: Positive for back pain.  All other systems reviewed and are negative.   Physical Exam Updated Vital Signs BP 123/89   Pulse 88   Temp 97.7 F (36.5 C)   Resp 18   Ht _0  (1.676 m)   Wt 86.2 kg   SpO2 99%   BMI 30.67 kg/m   Physical Exam Vitals and nursing note reviewed.  Constitutional:      General: She is not in acute distress.    Appearance: Normal appearance. She is well-developed.  HENT:     Head: Normocephalic and atraumatic.     Right Ear: Hearing normal.     Left Ear: Hearing normal.     Nose: Nose normal.  Eyes:     Conjunctiva/sclera: Conjunctivae normal.     Pupils: Pupils are equal, round, and reactive to light.  Cardiovascular:     Rate and Rhythm: Regular rhythm.     Heart sounds: S1 normal and S2 normal. No murmur heard.  No friction rub. No gallop.   Pulmonary:     Effort: Pulmonary effort is normal. No respiratory distress.     Breath sounds: Normal breath sounds.  Chest:     Chest wall: No tenderness.  Abdominal:     General: Bowel sounds are normal.     Palpations: Abdomen is soft.     Tenderness: There is no abdominal tenderness. There is no guarding or rebound. Negative signs include Murphy's sign and McBurney's sign.     Hernia: No hernia is present.  Musculoskeletal:     Cervical back: Normal range of motion and neck supple.     Lumbar back: Tenderness present.  Decreased range of motion.       Back:  Skin:    General: Skin is warm and dry.     Findings: No rash.  Neurological:     Mental Status: She is alert and oriented to person,  place, and time.     GCS: GCS eye subscore is 4. GCS verbal subscore is 5. GCS motor subscore is 6.     Cranial Nerves: No cranial nerve deficit.     Sensory: No sensory deficit.     Coordination: Coordination normal.  Psychiatric:        Speech: Speech normal.        Behavior: Behavior normal.        Thought Content: Thought content normal.     ED Results / Procedures / Treatments   Labs (all labs ordered are listed, but only abnormal results are displayed) Labs Reviewed  URINALYSIS, ROUTINE W REFLEX MICROSCOPIC    EKG None  Radiology No results found.  Procedures Procedures (including critical care time)  Medications Ordered in ED Medications - No data to display  ED Course  I have reviewed the triage vital signs and the nursing notes.  Pertinent labs & imaging results that were available during my care of the patient were reviewed by me and considered in my medical decision making (see chart for details).    MDM Rules/Calculators/A&P                          Patient presents for evaluation of back pain.  She has not had any specific injury but was recently on a long bus ride and reports that the pain started while she was sitting on the bus.  Pain has slowly and progressively worsens.  She does have some radiation to the left hip area.  Urinalysis does not suggest infection.  Examination does reveal tenderness, pain with movement.  This is consistent with musculoskeletal back pain.  Blood sugar is elevated.  She has not taken her morning medications.  Will treat with short acting insulin and she can take her long-acting insulin and oral meds when she gets home.  Final Clinical Impression(s) / ED Diagnoses Final diagnoses:  Acute left-sided low back pain without sciatica    Rx / DC  Orders ED Discharge Orders    None       Orpah Greek, MD 05/02/20 [phone]

## 2020-05-02 NOTE — ED Triage Notes (Signed)
Pt arrives from [place] ED with left sided back pain. States ongoing x1 week, worsening. Denies urinary S/S - maybe some blood in urine.

## 2020-05-09 ENCOUNTER — Inpatient Hospital Stay (HOSPITAL_BASED_OUTPATIENT_CLINIC_OR_DEPARTMENT_OTHER)
Admission: EM | Admit: 2020-05-09 | Discharge: 2020-05-12 | DRG: 389 | Disposition: A | Discharge status: Home / Self Care | Payer: Medicaid Other | Attending: Family Medicine | Admitting: Family Medicine

## 2020-05-09 ENCOUNTER — Emergency Department (HOSPITAL_BASED_OUTPATIENT_CLINIC_OR_DEPARTMENT_OTHER): Payer: Medicaid Other

## 2020-05-09 ENCOUNTER — Encounter (HOSPITAL_BASED_OUTPATIENT_CLINIC_OR_DEPARTMENT_OTHER): Payer: Self-pay | Admitting: Emergency Medicine

## 2020-05-09 ENCOUNTER — Other Ambulatory Visit: Payer: Self-pay

## 2020-05-09 DIAGNOSIS — Z888 Allergy status to other drugs, medicaments and biological substances status: Secondary | ICD-10-CM

## 2020-05-09 DIAGNOSIS — E871 Hypo-osmolality and hyponatremia: Secondary | ICD-10-CM | POA: Diagnosis not present

## 2020-05-09 DIAGNOSIS — Z91041 Radiographic dye allergy status: Secondary | ICD-10-CM

## 2020-05-09 DIAGNOSIS — I10 Essential (primary) hypertension: Secondary | ICD-10-CM | POA: Diagnosis present

## 2020-05-09 DIAGNOSIS — E1165 Type 2 diabetes mellitus with hyperglycemia: Secondary | ICD-10-CM | POA: Diagnosis present

## 2020-05-09 DIAGNOSIS — Z79899 Other long term (current) drug therapy: Secondary | ICD-10-CM

## 2020-05-09 DIAGNOSIS — F17210 Nicotine dependence, cigarettes, uncomplicated: Secondary | ICD-10-CM | POA: Diagnosis present

## 2020-05-09 DIAGNOSIS — K219 Gastro-esophageal reflux disease without esophagitis: Secondary | ICD-10-CM | POA: Diagnosis present

## 2020-05-09 DIAGNOSIS — K56600 Partial intestinal obstruction, unspecified as to cause: Principal | ICD-10-CM | POA: Diagnosis present

## 2020-05-09 DIAGNOSIS — Z885 Allergy status to narcotic agent status: Secondary | ICD-10-CM

## 2020-05-09 DIAGNOSIS — Z20822 Contact with and (suspected) exposure to covid-19: Secondary | ICD-10-CM | POA: Diagnosis present

## 2020-05-09 DIAGNOSIS — Z86711 Personal history of pulmonary embolism: Secondary | ICD-10-CM

## 2020-05-09 DIAGNOSIS — K589 Irritable bowel syndrome without diarrhea: Secondary | ICD-10-CM | POA: Diagnosis present

## 2020-05-09 DIAGNOSIS — E669 Obesity, unspecified: Secondary | ICD-10-CM | POA: Diagnosis present

## 2020-05-09 DIAGNOSIS — Z8249 Family history of ischemic heart disease and other diseases of the circulatory system: Secondary | ICD-10-CM

## 2020-05-09 DIAGNOSIS — N736 Female pelvic peritoneal adhesions (postinfective): Secondary | ICD-10-CM | POA: Diagnosis present

## 2020-05-09 DIAGNOSIS — Z6830 Body mass index (BMI) 30.0-30.9, adult: Secondary | ICD-10-CM

## 2020-05-09 DIAGNOSIS — Z794 Long term (current) use of insulin: Secondary | ICD-10-CM

## 2020-05-09 DIAGNOSIS — E876 Hypokalemia: Secondary | ICD-10-CM | POA: Diagnosis not present

## 2020-05-09 DIAGNOSIS — Z833 Family history of diabetes mellitus: Secondary | ICD-10-CM

## 2020-05-09 DIAGNOSIS — K56609 Unspecified intestinal obstruction, unspecified as to partial versus complete obstruction: Secondary | ICD-10-CM

## 2020-05-09 DIAGNOSIS — E119 Type 2 diabetes mellitus without complications: Secondary | ICD-10-CM

## 2020-05-09 LAB — URINALYSIS, MICROSCOPIC (REFLEX)

## 2020-05-09 LAB — CBC
HCT: 40.2 % (ref 36.0–46.0)
Hemoglobin: 13.3 g/dL (ref 12.0–15.0)
MCH: 27.5 pg (ref 26.0–34.0)
MCHC: 33.1 g/dL (ref 30.0–36.0)
MCV: 83.1 fL (ref 80.0–100.0)
Platelets: 295 10*3/uL (ref 150–400)
RBC: 4.84 MIL/uL (ref 3.87–5.11)
RDW: 13.4 % (ref 11.5–15.5)
WBC: 5.1 10*3/uL (ref 4.0–10.5)
nRBC: 0 % (ref 0.0–0.2)

## 2020-05-09 LAB — COMPREHENSIVE METABOLIC PANEL
ALT: 15 U/L (ref 0–44)
AST: 16 U/L (ref 15–41)
Albumin: 3.3 g/dL — ABNORMAL LOW (ref 3.5–5.0)
Alkaline Phosphatase: 75 U/L (ref 38–126)
Anion gap: 10 (ref 5–15)
BUN: 9 mg/dL (ref 6–20)
CO2: 26 mmol/L (ref 22–32)
Calcium: 8.6 mg/dL — ABNORMAL LOW (ref 8.9–10.3)
Chloride: 96 mmol/L — ABNORMAL LOW (ref 98–111)
Creatinine, Ser: 0.55 mg/dL (ref 0.44–1.00)
GFR calc Af Amer: >60 mL/min (ref >60)
GFR calc non Af Amer: >60 mL/min (ref >60)
Glucose, Bld: 467 mg/dL — ABNORMAL HIGH (ref 70–99)
Potassium: 3.4 mmol/L — ABNORMAL LOW (ref 3.5–5.1)
Sodium: 132 mmol/L — ABNORMAL LOW (ref 135–145)
Total Bilirubin: 0.4 mg/dL (ref 0.3–1.2)
Total Protein: 7 g/dL (ref 6.5–8.1)

## 2020-05-09 LAB — URINALYSIS, ROUTINE W REFLEX MICROSCOPIC
Bilirubin Urine: NEGATIVE
Glucose, UA: >=500 mg/dL — AB
Hgb urine dipstick: NEGATIVE
Ketones, ur: NEGATIVE mg/dL
Leukocytes,Ua: NEGATIVE
Nitrite: NEGATIVE
Protein, ur: NEGATIVE mg/dL
Specific Gravity, Urine: 1.01 (ref 1.005–1.030)
pH: 7 (ref 5.0–8.0)

## 2020-05-09 LAB — PREGNANCY, URINE: Preg Test, Ur: NEGATIVE

## 2020-05-09 LAB — LIPASE, BLOOD: Lipase: 32 U/L (ref 11–51)

## 2020-05-09 MED ORDER — FENTANYL CITRATE (PF) 100 MCG/2ML IJ SOLN
50.0000 ug | INTRAMUSCULAR | Status: DC | PRN
  Administered 2020-05-09: 50 ug via INTRAVENOUS
  Filled 2020-05-09: qty 2

## 2020-05-09 MED ORDER — LACTATED RINGERS IV BOLUS
1000.0000 mL | Freq: Once | INTRAVENOUS | Status: AC
  Administered 2020-05-09: 1000 mL via INTRAVENOUS

## 2020-05-09 MED ORDER — LACTATED RINGERS IV SOLN: INTRAVENOUS | Status: DC

## 2020-05-09 MED ORDER — DIPHENHYDRAMINE HCL 25 MG PO CAPS
25.0000 mg | ORAL_CAPSULE | Freq: Once | ORAL | Status: AC
  Administered 2020-05-09: 25 mg via ORAL
  Filled 2020-05-09: qty 1

## 2020-05-09 MED ORDER — ONDANSETRON HCL 4 MG/2ML IJ SOLN
4.0000 mg | Freq: Once | INTRAMUSCULAR | Status: AC
  Administered 2020-05-09: 4 mg via INTRAVENOUS
  Filled 2020-05-09: qty 2

## 2020-05-09 MED ORDER — FENTANYL CITRATE (PF) 100 MCG/2ML IJ SOLN
50.0000 ug | Freq: Once | INTRAMUSCULAR | Status: AC
  Administered 2020-05-09: 50 ug via INTRAVENOUS
  Filled 2020-05-09: qty 2

## 2020-05-09 MED ORDER — HYDROMORPHONE HCL 1 MG/ML IJ SOLN
1.0000 mg | INTRAMUSCULAR | Status: DC | PRN
  Administered 2020-05-09 – 2020-05-10 (×4): 1 mg via INTRAVENOUS
  Filled 2020-05-09 (×4): qty 1

## 2020-05-09 NOTE — ED Provider Notes (Signed)
[facility] EMERGENCY DEPARTMENT Provider Note   CSN: [id number] Arrival date & time: 05/09/20  1912     History Chief Complaint  Patient presents with  . Abdominal Pain  . Diarrhea    Jodi Mcgee is a 34 y.o. female.  Patient is a 34 year old female with a history of diabetes, PE, anemia and GERD who is presenting today with persistent abdominal pain. Patient reports she was seen approximately a week ago and she was having pain on her left side kind of her back and her abdomen. At that time she had relatively normal labs and thought it may have been related to riding on a bus. She was given tramadol and methocarbamol but reports she is continued to take these medications but has not improved and is actually started worsening. Last night the pain became severe and more localized in the left lower quadrant. She was having frequent attacks of loose stool but only a small amount at a time. She has had no urinary complaints. She has had nausea but no vomiting and reports she has been unable to eat. She denies fever or sweating. She has tried to take the medication but states it is not helped at all. She reports the only other time she had abdominal pain is when she had a ruptured appendicitis but she is never had pain like this before. She has an IUD and denies any vaginal bleeding or discharge.  The history is provided by the patient.  Abdominal Pain Associated symptoms: diarrhea   Diarrhea Associated symptoms: abdominal pain        Past Medical History:  Diagnosis Date  . Anemia   . Anxiety   . Blood transfusion without reported diagnosis    both CS  . Diabetes mellitus without complication ([place])   . GERD (gastroesophageal reflux disease)    has resolved  . IBS (irritable bowel syndrome)   . Ovarian cyst   . Peritonitis, acute generalized ([place])   . Pulmonary embolism ([place])   . Sepsis [facility])     Patient Active Problem List   Diagnosis Date Noted  .  Bartholin's gland abscess 02/21/2019  . DKA (diabetic ketoacidoses) ([place]) 02/20/2019  . Blood per rectum 02/20/2019  . Hyponatremia 09/26/2018  . Chronic hypertension 01/03/2018  . IUD contraception 01/03/2018  . Pulmonary embolism during puerperium 12/04/2017  . Pulmonary edema 11/30/2017  . Pelvic adhesive disease 10/22/2017  . Nasal septal perforation 10/17/2017  . History of cocaine abuse ([place]) 10/17/2017  . Epistaxis 10/17/2017  . 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 Obesity (BMI 30-39.9) 09/25/2017  . Type 2 diabetes mellitus, without long-term current use of insulin ([place]) 08/23/2017  . Chronic pain syndrome 08/23/2017    Past Surgical History:  Procedure Laterality Date  . APPENDECTOMY     ruptured   . BUNIONECTOMY    . CESAREAN SECTION    . CESAREAN SECTION Bilateral 11/20/2017   Procedure: CESAREAN SECTION;  Surgeon: Sloan Leiter, MD;  Location: [facility];  Service: Obstetrics;  Laterality: Bilateral;  . OVARIAN CYST DRAINAGE    . SMALL BOWEL REPAIR       OB History    Gravida  3   Para  3   Term  1   Preterm  2   AB  0   Living  3     SAB  0   TAB  0   Ectopic  0   Multiple  0   Live  Births  3           Family History  Problem Relation Age of Onset  . Hypertension Mother   . Anemia Mother   . Diabetes Father     Social History   Tobacco Use  . Smoking status: Light Tobacco Smoker    Packs/day: 0.50    Types: Cigarettes  . Smokeless tobacco: Never Used  . Tobacco comment: social use with tobacco  Vaping Use  . Vaping Use: Never used  Substance Use Topics  . Alcohol use: Yes    Comment: 3 times a month  . Drug use: Not Currently    Home Medications Prior to Admission medications   Medication Sig Start Date End Date Taking? Authorizing Provider  blood glucose meter kit and supplies KIT Dispense based on patient and insurance preference. Use up to four times daily as directed. (FOR ICD-9 250.00, 250.01).  02/21/19   Oretha Milch D, MD  cyclobenzaprine (FLEXERIL) 5 MG tablet Take 1 tablet (5 mg total) by mouth 3 (three) times daily as needed (muscle soreness). 01/12/20   Rolland Porter, MD  glipiZIDE (GLUCOTROL) 5 MG tablet Take 1 tablet (5 mg total) by mouth 2 (two) times daily before a meal. Patient not taking: Reported on 12/16/2019 05/17/19   Scot Jun, FNP  insulin detemir (LEVEMIR) 100 unit/ml SOLN Inject 0.2 mLs (20 Units total) into the skin 2 (two) times daily. 05/17/19   Scot Jun, FNP  JARDIANCE 10 MG TABS tablet Take 10 mg by mouth at bedtime.  07/02/19   [provider]  Levonorgestrel (LILETTA) 19.5 MCG/DAY IUD IUD 1 each by Intrauterine route continuous. Placed in 2019    [provider]  metFORMIN (GLUCOPHAGE) 1000 MG tablet Take 1 tablet (1,000 mg total) by mouth 2 (two) times daily with a meal. 05/17/19   Scot Jun, FNP  methocarbamol (ROBAXIN) 500 MG tablet Take 1 tablet (500 mg total) by mouth every 8 (eight) hours as needed for muscle spasms. 05/02/20   Orpah Greek, MD  oxyCODONE-acetaminophen (PERCOCET/ROXICET) 5-325 MG tablet Take 1 tablet by mouth every 4 (four) hours as needed for severe pain. 07/18/19   Anyanwu, Sallyanne Havers, MD  traMADol (ULTRAM) 50 MG tablet Take 1 tablet (50 mg total) by mouth every 6 (six) hours as needed. 05/02/20   Orpah Greek, MD    Allergies    Cetirizine & related, Toradol [ketorolac tromethamine], Flagyl [metronidazole], Contrast media [iodinated diagnostic agents], and Nsaids  Review of Systems   Review of Systems  Gastrointestinal: Positive for abdominal pain and diarrhea.  All other systems reviewed and are negative.   Physical Exam Updated Vital Signs BP 126/82 (BP Location: Right Arm)   Pulse 92   Temp 98.3 F (36.8 C) (Oral)   Resp (!) 24 Comment: crying  Ht '5\' 6"'  (1.676 m)   Wt 86.2 kg   SpO2 100%   BMI 30.67 kg/m   Physical Exam Vitals and nursing note reviewed.    Constitutional:      General: She is in acute distress.     Appearance: She is well-developed and normal weight. She is diaphoretic.  HENT:     Head: Normocephalic and atraumatic.  Eyes:     Pupils: Pupils are equal, round, and reactive to light.  Cardiovascular:     Rate and Rhythm: Normal rate and regular rhythm.     Heart sounds: Normal heart sounds. No murmur heard.  No friction rub.  Pulmonary:  Effort: Pulmonary effort is normal.     Breath sounds: Normal breath sounds. No wheezing or rales.  Abdominal:     General: Abdomen is flat. Bowel sounds are normal. There is no distension.     Palpations: Abdomen is soft.     Tenderness: There is abdominal tenderness in the left lower quadrant. There is no right CVA tenderness, left CVA tenderness, guarding or rebound.  Musculoskeletal:        General: No tenderness. Normal range of motion.     Comments: No edema  Skin:    General: Skin is warm.     Findings: No rash.  Neurological:     Mental Status: She is alert and oriented to person, place, and time.     Cranial Nerves: No cranial nerve deficit.  Psychiatric:        Behavior: Behavior normal.     ED Results / Procedures / Treatments   Labs (all labs ordered are listed, but only abnormal results are displayed) Labs Reviewed  COMPREHENSIVE METABOLIC PANEL - Abnormal; Notable for the following components:      Result Value   Sodium 132 (*)    Potassium 3.4 (*)    Chloride 96 (*)    Glucose, Bld 467 (*)    Calcium 8.6 (*)    Albumin 3.3 (*)    All other components within normal limits  URINALYSIS, ROUTINE W REFLEX MICROSCOPIC - Abnormal; Notable for the following components:   Glucose, UA >=500 (*)    All other components within normal limits  URINALYSIS, MICROSCOPIC (REFLEX) - Abnormal; Notable for the following components:   Bacteria, UA RARE (*)    All other components within normal limits  SARS CORONAVIRUS 2 BY RT PCR (HOSPITAL ORDER, [facility])  LIPASE, BLOOD  CBC  PREGNANCY, URINE    EKG None  Radiology CT ABDOMEN PELVIS WO CONTRAST  Result Date: 05/09/2020 CLINICAL DATA:  Left lower quadrant pain EXAM: CT ABDOMEN AND PELVIS WITHOUT CONTRAST TECHNIQUE: Multidetector CT imaging of the abdomen and pelvis was performed following the standard protocol without IV contrast. COMPARISON:  January 11, 2020 FINDINGS: Lower chest: The visualized heart size within normal limits. No pericardial fluid/thickening. No hiatal hernia. The visualized portions of the lungs are clear. Hepatobiliary: Although limited due to the lack of intravenous contrast, normal in appearance without gross focal abnormality. No evidence of calcified gallstones or biliary ductal dilatation. Pancreas:  Unremarkable.  No surrounding inflammatory changes. Spleen: Normal in size. Although limited due to the lack of intravenous contrast, normal in appearance. Adrenals/Urinary Tract: Both adrenal glands appear normal. The kidneys and collecting system appear normal without evidence of urinary tract calculus or hydronephrosis. Bladder is unremarkable. Stomach/Bowel: The stomach is normal in appearance. There is mildly dilated air and fluid-filled loops ileum seen within the mid abdomen measuring up to 3.6 cm. There is a focal area of narrowing seen within the distal ileal loops best seen on series 5, image 25. The remainder of the distal ileal loops are decompressed. The colon is unremarkable. The patient is status post appendectomy. Vascular/Lymphatic: There are no enlarged abdominal or pelvic lymph nodes. No significant gross vascular findings are present. Reproductive: IUD seen within the endometrial canal. Other: No evidence of abdominal wall mass or hernia. Musculoskeletal: No acute or significant osseous findings. IMPRESSION: Findings suggestive of partial small bowel obstruction with a focal area of narrowing seen within the distal ileal loops as described above. No  focal area  wall thickening or surrounding fat stranding changes. This could be due to adhesions. Electronically Signed   By: Prudencio Pair M.D.   On: 05/09/2020 22:05    Procedures Procedures (including critical care time)  Medications Ordered in ED Medications  fentaNYL (SUBLIMAZE) injection 50 mcg (has no administration in time range)  ondansetron (ZOFRAN) injection 4 mg (has no administration in time range)  lactated ringers bolus 1,000 mL (has no administration in time range)    ED Course  I have reviewed the triage vital signs and the nursing notes.  Pertinent labs & imaging results that were available during my care of the patient were reviewed by me and considered in my medical decision making (see chart for details).    MDM Rules/Calculators/A&P                          34 year old female presenting today with worsening abdominal pain.  Patient denies any urinary or vaginal symptoms but concern for potential kidney stone versus diverticulitis versus bowel perforation versus ovarian torsion.  Patient is not having fever or concern for infection at this time.  Labs pending.  Patient given pain control.  Will need imaging.  11:36 PM Labs without acute findings except for hyperglycemia in the 400s but no evidence of DKA or anion gap.  UA is negative for ketones.  CBC with normal white count.  CT shows findings suggestive of partial small bowel obstruction with a focal area of narrowing seen within the distal ileal loops as described above.  Thought to be from adhesions which patient has had abdominal surgery in the past.  On repeat evaluation she is still having pain.  She has had no vomiting and nausea has improved.  We will continue to treat pain.  Patient will be admitted for further care.  Started on IV fluids at 75 mL's per hour maintenance.  MDM Number of Diagnoses or Management Options   Amount and/or Complexity of Data Reviewed Clinical lab tests: ordered and reviewed Tests  in the radiology section of CPT: ordered and reviewed Tests in the medicine section of CPT: ordered and reviewed Decide to obtain previous medical records or to obtain history from someone other than the patient: yes Obtain history from someone other than the patient: no Review and summarize past medical records: yes Discuss the patient with other providers: yes Independent visualization of images, tracings, or specimens: yes  Risk of Complications, Morbidity, and/or Mortality Presenting problems: moderate Diagnostic procedures: low Management options: low  Patient Progress Patient progress: improved   Final Clinical Impression(s) / ED Diagnoses Final diagnoses:  Partial small bowel obstruction [facility])    Rx / DC Orders ED Discharge Orders    None       Blanchie Dessert, MD 05/09/20 2337

## 2020-05-09 NOTE — ED Triage Notes (Signed)
Pt c/o severe abdominal pain with nausea and diarrhea x 1 week.

## 2020-05-10 ENCOUNTER — Encounter (HOSPITAL_COMMUNITY): Payer: Self-pay | Admitting: Internal Medicine

## 2020-05-10 DIAGNOSIS — Z833 Family history of diabetes mellitus: Secondary | ICD-10-CM | POA: Diagnosis not present

## 2020-05-10 DIAGNOSIS — E1165 Type 2 diabetes mellitus with hyperglycemia: Secondary | ICD-10-CM

## 2020-05-10 DIAGNOSIS — Z86711 Personal history of pulmonary embolism: Secondary | ICD-10-CM | POA: Diagnosis not present

## 2020-05-10 DIAGNOSIS — Z6830 Body mass index (BMI) 30.0-30.9, adult: Secondary | ICD-10-CM | POA: Diagnosis not present

## 2020-05-10 DIAGNOSIS — Z79899 Other long term (current) drug therapy: Secondary | ICD-10-CM | POA: Diagnosis not present

## 2020-05-10 DIAGNOSIS — N736 Female pelvic peritoneal adhesions (postinfective): Secondary | ICD-10-CM | POA: Diagnosis not present

## 2020-05-10 DIAGNOSIS — Z794 Long term (current) use of insulin: Secondary | ICD-10-CM | POA: Diagnosis not present

## 2020-05-10 DIAGNOSIS — Z888 Allergy status to other drugs, medicaments and biological substances status: Secondary | ICD-10-CM | POA: Diagnosis not present

## 2020-05-10 DIAGNOSIS — K589 Irritable bowel syndrome without diarrhea: Secondary | ICD-10-CM | POA: Diagnosis present

## 2020-05-10 DIAGNOSIS — K56600 Partial intestinal obstruction, unspecified as to cause: Secondary | ICD-10-CM | POA: Diagnosis present

## 2020-05-10 DIAGNOSIS — K219 Gastro-esophageal reflux disease without esophagitis: Secondary | ICD-10-CM | POA: Diagnosis present

## 2020-05-10 DIAGNOSIS — E669 Obesity, unspecified: Secondary | ICD-10-CM

## 2020-05-10 DIAGNOSIS — Z8249 Family history of ischemic heart disease and other diseases of the circulatory system: Secondary | ICD-10-CM | POA: Diagnosis not present

## 2020-05-10 DIAGNOSIS — Z20822 Contact with and (suspected) exposure to covid-19: Secondary | ICD-10-CM | POA: Diagnosis present

## 2020-05-10 DIAGNOSIS — E876 Hypokalemia: Secondary | ICD-10-CM | POA: Diagnosis not present

## 2020-05-10 DIAGNOSIS — Z91041 Radiographic dye allergy status: Secondary | ICD-10-CM | POA: Diagnosis not present

## 2020-05-10 DIAGNOSIS — E871 Hypo-osmolality and hyponatremia: Secondary | ICD-10-CM | POA: Diagnosis not present

## 2020-05-10 DIAGNOSIS — Z885 Allergy status to narcotic agent status: Secondary | ICD-10-CM | POA: Diagnosis not present

## 2020-05-10 DIAGNOSIS — I10 Essential (primary) hypertension: Secondary | ICD-10-CM | POA: Diagnosis present

## 2020-05-10 DIAGNOSIS — F17210 Nicotine dependence, cigarettes, uncomplicated: Secondary | ICD-10-CM | POA: Diagnosis present

## 2020-05-10 DIAGNOSIS — R109 Unspecified abdominal pain: Secondary | ICD-10-CM | POA: Diagnosis present

## 2020-05-10 LAB — CREATININE, SERUM
Creatinine, Ser: 0.48 mg/dL (ref 0.44–1.00)
GFR calc Af Amer: >60 mL/min (ref >60)
GFR calc non Af Amer: >60 mL/min (ref >60)

## 2020-05-10 LAB — CBC
HCT: 39.6 % (ref 36.0–46.0)
Hemoglobin: 12.8 g/dL (ref 12.0–15.0)
MCH: 27.5 pg (ref 26.0–34.0)
MCHC: 32.3 g/dL (ref 30.0–36.0)
MCV: 85.2 fL (ref 80.0–100.0)
Platelets: 241 10*3/uL (ref 150–400)
RBC: 4.65 MIL/uL (ref 3.87–5.11)
RDW: 13.4 % (ref 11.5–15.5)
WBC: 3.9 10*3/uL — ABNORMAL LOW (ref 4.0–10.5)
nRBC: 0 % (ref 0.0–0.2)

## 2020-05-10 LAB — GLUCOSE, CAPILLARY
Glucose-Capillary: 119 mg/dL — ABNORMAL HIGH (ref 70–99)
Glucose-Capillary: 141 mg/dL — ABNORMAL HIGH (ref 70–99)
Glucose-Capillary: 283 mg/dL — ABNORMAL HIGH (ref 70–99)
Glucose-Capillary: 83 mg/dL (ref 70–99)

## 2020-05-10 LAB — HEMOGLOBIN A1C
Hgb A1c MFr Bld: 13.4 % — ABNORMAL HIGH (ref 4.8–5.6)
Mean Plasma Glucose: 337.88 mg/dL

## 2020-05-10 LAB — HIV ANTIBODY (ROUTINE TESTING W REFLEX): HIV Screen 4th Generation wRfx: NONREACTIVE

## 2020-05-10 LAB — SARS CORONAVIRUS 2 BY RT PCR (HOSPITAL ORDER, PERFORMED IN ~~LOC~~ HOSPITAL LAB): SARS Coronavirus 2: NEGATIVE

## 2020-05-10 LAB — CBG MONITORING, ED: Glucose-Capillary: 273 mg/dL — ABNORMAL HIGH (ref 70–99)

## 2020-05-10 MED ORDER — SODIUM CHLORIDE 0.9 % IV SOLN: INTRAVENOUS | Status: DC

## 2020-05-10 MED ORDER — ENOXAPARIN SODIUM 40 MG/0.4ML ~~LOC~~ SOLN
40.0000 mg | SUBCUTANEOUS | Status: DC
  Administered 2020-05-10 – 2020-05-12 (×3): 40 mg via SUBCUTANEOUS
  Filled 2020-05-10 (×3): qty 0.4

## 2020-05-10 MED ORDER — INSULIN ASPART 100 UNIT/ML ~~LOC~~ SOLN
0.0000 [IU] | SUBCUTANEOUS | Status: DC
  Administered 2020-05-10: 8 [IU] via SUBCUTANEOUS
  Administered 2020-05-10 – 2020-05-11 (×2): 2 [IU] via SUBCUTANEOUS
  Administered 2020-05-11: 8 [IU] via SUBCUTANEOUS
  Administered 2020-05-11: 2 [IU] via SUBCUTANEOUS
  Administered 2020-05-11: 3 [IU] via SUBCUTANEOUS
  Administered 2020-05-11: 5 [IU] via SUBCUTANEOUS
  Administered 2020-05-12: 2 [IU] via SUBCUTANEOUS
  Administered 2020-05-12: 3 [IU] via SUBCUTANEOUS
  Administered 2020-05-12: 11 [IU] via SUBCUTANEOUS

## 2020-05-10 MED ORDER — ONDANSETRON HCL 4 MG/2ML IJ SOLN: 4.0000 mg | Freq: Four times a day (QID) | INTRAMUSCULAR | Status: DC | PRN

## 2020-05-10 MED ORDER — ACETAMINOPHEN 650 MG RE SUPP: 650.0000 mg | Freq: Four times a day (QID) | RECTAL | Status: DC | PRN

## 2020-05-10 MED ORDER — DIATRIZOATE MEGLUMINE & SODIUM 66-10 % PO SOLN
90.0000 mL | Freq: Once | ORAL | Status: DC
  Filled 2020-05-10: qty 90

## 2020-05-10 MED ORDER — ONDANSETRON HCL 4 MG PO TABS: 4.0000 mg | ORAL_TABLET | Freq: Four times a day (QID) | ORAL | Status: DC | PRN

## 2020-05-10 MED ORDER — ACETAMINOPHEN 325 MG PO TABS: 650.0000 mg | ORAL_TABLET | Freq: Four times a day (QID) | ORAL | Status: DC | PRN

## 2020-05-10 MED ORDER — DIPHENHYDRAMINE HCL 50 MG/ML IJ SOLN
12.5000 mg | Freq: Four times a day (QID) | INTRAMUSCULAR | Status: DC | PRN
  Administered 2020-05-10 – 2020-05-12 (×7): 12.5 mg via INTRAVENOUS
  Filled 2020-05-10 (×7): qty 1

## 2020-05-10 MED ORDER — HYDROMORPHONE HCL 1 MG/ML IJ SOLN
1.0000 mg | INTRAMUSCULAR | Status: DC | PRN
  Administered 2020-05-10 – 2020-05-11 (×6): 1 mg via INTRAVENOUS
  Filled 2020-05-10 (×6): qty 1

## 2020-05-10 NOTE — ED Notes (Signed)
Attempt to call report to inpatient nurse, [facility] at bedside, floor nurse reports they are not accepting this admission, that it was cancelled, ED charge nurse notified, attempt to call to bed assignment for clarification.

## 2020-05-10 NOTE — ED Notes (Signed)
Called into room by patient.  Requesting IV benadryl.  Was given po benadryl a few hours ago.  Provider made aware of patient request.  No new orders received.

## 2020-05-10 NOTE — H&P (Signed)
History and Physical    Jodi Mcgee:[id number] DOB: 1986/07/31 DOA: 05/09/2020  PCP: [facility]  Patient coming from: Home  Chief Complaint: Abd pain  HPI: Jodi Mcgee is a 34 y.o. female with medical history significant of DM2, IBS who presented to ED initially with complaints of abd pain. Pt was seen in ED one week prior with complaints of back pain, given analgesics. On 7/18, pt noted worsening abd pain with resultant decrease in appetite. Pt subsequently presented to [facility] ED for further w/u.  Of note, pt reports hx of appendicitis with resultant abd surgery in 2014 performed in [place]  ED Course: In the ED, pt underwent CT abd/pelvis with finding of SBO. Glucose noted to be in the 400's without increased GAP. Pt was given fentanyl for pain, IVF. Hospitalist consulted and pt was accepted in transfer  Review of Systems:  Review of Systems  Constitutional: Negative for chills and fever.  HENT: Negative for congestion, ear pain and tinnitus.   Eyes: Negative for double vision, photophobia and pain.  Respiratory: Negative for hemoptysis, sputum production and shortness of breath.   Cardiovascular: Negative for chest pain, palpitations, orthopnea and claudication.  Gastrointestinal: Positive for abdominal pain. Negative for blood in stool and nausea.  Genitourinary: Negative for frequency, hematuria and urgency.  Musculoskeletal: Negative for back pain, joint pain and neck pain.  Neurological: Negative for tremors, sensory change, seizures and loss of consciousness.  Psychiatric/Behavioral: Negative for hallucinations and memory loss. The patient is not nervous/anxious.     Past Medical History:  Diagnosis Date  . Anemia   . Anxiety   . Blood transfusion without reported diagnosis    both CS  . Diabetes mellitus without complication ([place])   . GERD (gastroesophageal reflux disease)    has resolved  . IBS (irritable bowel syndrome)   .  Ovarian cyst   . Peritonitis, acute generalized ([place])   . Pulmonary embolism ([place])   . Sepsis [facility])     Past Surgical History:  Procedure Laterality Date  . APPENDECTOMY     ruptured   . BUNIONECTOMY    . CESAREAN SECTION    . CESAREAN SECTION Bilateral 11/20/2017   Procedure: CESAREAN SECTION;  Surgeon: Sloan Leiter, MD;  Location: [place];  Service: Obstetrics;  Laterality: Bilateral;  . OVARIAN CYST DRAINAGE    . SMALL BOWEL REPAIR       reports that she has been smoking cigarettes. She has been smoking about 0.50 packs per day. She has never used smokeless tobacco. She reports current alcohol use. She reports previous drug use.  Allergies  Allergen Reactions  . Cetirizine & Related Itching and Swelling  . Toradol [Ketorolac Tromethamine] Hives and Itching  . Flagyl [Metronidazole] Itching and Swelling  . Contrast Media [Iodinated Diagnostic Agents] Itching    Mild itching after IV contrast on 6/1/7 No urticaria visible No wheezing No angioedema  . Nsaids Rash    Family History  Problem Relation Age of Onset  . Hypertension Mother   . Anemia Mother   . Diabetes Father     Prior to Admission medications   Medication Sig Start Date End Date Taking? Authorizing Provider  blood glucose meter kit and supplies KIT Dispense based on patient and insurance preference. Use up to four times daily as directed. (FOR ICD-9 250.00, 250.01). 02/21/19   Oretha Milch D, MD  cyclobenzaprine (FLEXERIL) 5 MG tablet Take 1 tablet (5 mg total) by  mouth 3 (three) times daily as needed (muscle soreness). 01/12/20   Rolland Porter, MD  glipiZIDE (GLUCOTROL) 5 MG tablet Take 1 tablet (5 mg total) by mouth 2 (two) times daily before a meal. Patient not taking: Reported on 12/16/2019 05/17/19   Scot Jun, FNP  insulin detemir (LEVEMIR) 100 unit/ml SOLN Inject 0.2 mLs (20 Units total) into the skin 2 (two) times daily. 05/17/19   Scot Jun, FNP  JARDIANCE 10 MG TABS tablet  Take 10 mg by mouth at bedtime.  07/02/19   [provider]  Levonorgestrel (LILETTA) 19.5 MCG/DAY IUD IUD 1 each by Intrauterine route continuous. Placed in 2019    [provider]  metFORMIN (GLUCOPHAGE) 1000 MG tablet Take 1 tablet (1,000 mg total) by mouth 2 (two) times daily with a meal. 05/17/19   Scot Jun, FNP  methocarbamol (ROBAXIN) 500 MG tablet Take 1 tablet (500 mg total) by mouth every 8 (eight) hours as needed for muscle spasms. 05/02/20   Orpah Greek, MD  oxyCODONE-acetaminophen (PERCOCET/ROXICET) 5-325 MG tablet Take 1 tablet by mouth every 4 (four) hours as needed for severe pain. 07/18/19   Anyanwu, Sallyanne Havers, MD  traMADol (ULTRAM) 50 MG tablet Take 1 tablet (50 mg total) by mouth every 6 (six) hours as needed. 05/02/20   Orpah Greek, MD    Physical Exam: Vitals:   05/10/20 0030 05/10/20 0112 05/10/20 0705 05/10/20 0931  BP: 113/72 (!) 125/93  134/84  Pulse: (!) 58 69 67 68  Resp:  16  16  Temp:  97.7 F (36.5 C)  98 F (36.7 C)  TempSrc:  Oral  Oral  SpO2: 97% 100% 97% 98%  Weight:      Height:        Constitutional: NAD, calm, comfortable  Vitals:   05/10/20 0030 05/10/20 0112 05/10/20 0705 05/10/20 0931  BP: 113/72 (!) 125/93  134/84  Pulse: (!) 58 69 67 68  Resp:  16  16  Temp:  97.7 F (36.5 C)  98 F (36.7 C)  TempSrc:  Oral  Oral  SpO2: 97% 100% 97% 98%  Weight:      Height:       Eyes: PERRL, lids and conjunctivae normal ENMT: Mucous membranes are dry. Posterior pharynx clear of any exudate or lesions.Normal dentition.  Neck: normal, supple, no masses, no thyromegaly Respiratory: clear to auscultation bilaterally, no wheezing, no crackles. Normal respiratory effort. No accessory muscle use.  Cardiovascular: Regular rate and rhythm, s1, s2 Abdomen: mildly distended, post-surgical mid-line scar, decreased BS Musculoskeletal: no clubbing / cyanosis. No joint deformity upper and lower extremities. Good ROM, no  contractures. Normal muscle tone.  Skin: no rashes, lesions. No induration Neurologic: CN 2-12 grossly intact. Sensation intact. Strength 5/5 in all 4.  Psychiatric: Normal judgment and insight. Alert and oriented x 3. Normal mood.    Labs on Admission: I have personally reviewed following labs and imaging studies  CBC: Recent Labs  Lab 05/09/20 1933  WBC 5.1  HGB 13.3  HCT 40.2  MCV 83.1  PLT 950   Basic Metabolic Panel: Recent Labs  Lab 05/09/20 1933  NA 132*  K 3.4*  CL 96*  CO2 26  GLUCOSE 467*  BUN 9  CREATININE 0.55  CALCIUM 8.6*   GFR: Estimated Creatinine Clearance: 109.7 mL/min (by C-G formula based on SCr of 0.55 mg/dL). Liver Function Tests: Recent Labs  Lab 05/09/20 1933  AST 16  ALT 15  ALKPHOS 75  BILITOT 0.4  PROT 7.0  ALBUMIN 3.3*   Recent Labs  Lab 05/09/20 1933  LIPASE 32   No results for input(s): AMMONIA in the last 168 hours. Coagulation Profile: No results for input(s): INR, PROTIME in the last 168 hours. Cardiac Enzymes: No results for input(s): CKTOTAL, CKMB, CKMBINDEX, TROPONINI in the last 168 hours. BNP (last 3 results) No results for input(s): PROBNP in the last 8760 hours. HbA1C: No results for input(s): HGBA1C in the last 72 hours. CBG: Recent Labs  Lab 05/10/20 0457  GLUCAP 273*   Lipid Profile: No results for input(s): CHOL, HDL, LDLCALC, TRIG, CHOLHDL, LDLDIRECT in the last 72 hours. Thyroid Function Tests: No results for input(s): TSH, T4TOTAL, FREET4, T3FREE, THYROIDAB in the last 72 hours. Anemia Panel: No results for input(s): VITAMINB12, FOLATE, FERRITIN, TIBC, IRON, RETICCTPCT in the last 72 hours. Urine analysis:    Component Value Date/Time   COLORURINE YELLOW 05/09/2020 2114   APPEARANCEUR CLEAR 05/09/2020 2114   APPEARANCEUR Clear 06/26/2014 0116   LABSPEC 1.010 05/09/2020 2114   LABSPEC 1.031 06/26/2014 0116   PHURINE 7.0 05/09/2020 2114   GLUCOSEU >=500 (A) 05/09/2020 2114   GLUCOSEU Negative  06/26/2014 0116   HGBUR NEGATIVE 05/09/2020 2114   BILIRUBINUR NEGATIVE 05/09/2020 2114   BILIRUBINUR Negative 06/26/2014 0116   KETONESUR NEGATIVE 05/09/2020 2114   PROTEINUR NEGATIVE 05/09/2020 2114   UROBILINOGEN 0.2 05/17/2019 1326   NITRITE NEGATIVE 05/09/2020 2114   LEUKOCYTESUR NEGATIVE 05/09/2020 2114   LEUKOCYTESUR Trace 06/26/2014 0116   Sepsis Labs: !!!!!!!!!!!!!!!!!!!!!!!!!!!!!!!!!!!!!!!!!!!! _0 (procalcitonin:4,lacticidven:4) ) Recent Results (from the past 240 hour(s))  SARS Coronavirus 2 by RT PCR (hospital order, performed in [facility] hospital lab) Nasopharyngeal Nasopharyngeal Swab     Status: None   Collection Time: 05/09/20 11:16 PM   Specimen: Nasopharyngeal Swab  Result Value Ref Range Status   SARS Coronavirus 2 NEGATIVE NEGATIVE Final    Comment: (NOTE) SARS-CoV-2 target nucleic acids are NOT DETECTED.  The SARS-CoV-2 RNA is generally detectable in upper and lower respiratory specimens during the acute phase of infection. The lowest concentration of SARS-CoV-2 viral copies this assay can detect is 250 copies / mL. A negative result does not preclude SARS-CoV-2 infection and should not be used as the sole basis for treatment or other patient management decisions.  A negative result may occur with improper specimen collection / handling, submission of specimen other than nasopharyngeal swab, presence of viral mutation(s) within the areas targeted by this assay, and inadequate number of viral copies (<250 copies / mL). A negative result must be combined with clinical observations, patient history, and epidemiological information.  Fact Sheet for Patients:   StrictlyIdeas.no  Fact Sheet for Healthcare Providers: BankingDealers.co.za  This test is not yet approved or  cleared by the [place] FDA and has been authorized for detection and/or diagnosis of SARS-CoV-2 by FDA under an Emergency Use  Authorization (EUA).  This EUA will remain in effect (meaning this test can be used) for the duration of the COVID-19 declaration under Section 564(b)(1) of the Act, 21 U.S.C. section 360bbb-3(b)(1), unless the authorization is terminated or revoked sooner.  Performed at [facility], [address] [place], [place] [address]      Radiological Exams on Admission: CT ABDOMEN PELVIS WO CONTRAST  Result Date: 05/09/2020 CLINICAL DATA:  Left lower quadrant pain EXAM: CT ABDOMEN AND PELVIS WITHOUT CONTRAST TECHNIQUE: Multidetector CT imaging of the abdomen and pelvis was performed following the standard protocol without IV contrast.  COMPARISON:  January 11, 2020 FINDINGS: Lower chest: The visualized heart size within normal limits. No pericardial fluid/thickening. No hiatal hernia. The visualized portions of the lungs are clear. Hepatobiliary: Although limited due to the lack of intravenous contrast, normal in appearance without gross focal abnormality. No evidence of calcified gallstones or biliary ductal dilatation. Pancreas:  Unremarkable.  No surrounding inflammatory changes. Spleen: Normal in size. Although limited due to the lack of intravenous contrast, normal in appearance. Adrenals/Urinary Tract: Both adrenal glands appear normal. The kidneys and collecting system appear normal without evidence of urinary tract calculus or hydronephrosis. Bladder is unremarkable. Stomach/Bowel: The stomach is normal in appearance. There is mildly dilated air and fluid-filled loops ileum seen within the mid abdomen measuring up to 3.6 cm. There is a focal area of narrowing seen within the distal ileal loops best seen on series 5, image 25. The remainder of the distal ileal loops are decompressed. The colon is unremarkable. The patient is status post appendectomy. Vascular/Lymphatic: There are no enlarged abdominal or pelvic lymph nodes. No significant gross vascular findings are present. Reproductive: IUD  seen within the endometrial canal. Other: No evidence of abdominal wall mass or hernia. Musculoskeletal: No acute or significant osseous findings. IMPRESSION: Findings suggestive of partial small bowel obstruction with a focal area of narrowing seen within the distal ileal loops as described above. No focal area wall thickening or surrounding fat stranding changes. This could be due to adhesions. Electronically Signed   By: Prudencio Pair M.D.   On: 05/09/2020 22:05    Assessment/Plan Principal Problem:   Partial small bowel obstruction (HCC) Active Problems:   Type 2 diabetes mellitus, without long-term current use of insulin (HCC)   Obesity (BMI 30-39.9)   Pelvic adhesive disease   Chronic hypertension   Controlled type 2 diabetes mellitus with hyperglycemia ([place])   1. SBO 1. CT abd/pelvis reviewed, pt with evidence of SBO with focal area of narrowing, findings correlates to adhesive disease 2. Currently NPO 3. Will continue IVF hydration 4. Have consulted General Surgery  2. DM2, poorly controlled 1. Presenting glucose in the 400's 2. Stable at present 3. Will continue on SSI coverage.  4. Hold oral hyperglycemic meds while in hospital 3. Obesity 1. Recommend diet/lifestyle modification 2. BMI 30.67 4. HTN 1. BP stable at present  DVT prophylaxis: Lovenox subq  Code Status: Full Family Communication: Pt in room, family not at bedside  Disposition Plan: Uncertain at this time  Consults called:  Admission status: Inpatient as it would likely require greater than 2 midnight stay to treat and resolve SBO   Marylu Lund MD [facility] Pager On Amion  If 7PM-7AM, please contact night-coverage  05/10/2020, 10:30 AM

## 2020-05-10 NOTE — ED Notes (Signed)
Pt resting, remains NPO, awaiting Med-Surg inpatient bed assignment.

## 2020-05-10 NOTE — ED Notes (Signed)
Bed placement gave new bed assignment for [id number]

## 2020-05-10 NOTE — Consult Note (Addendum)
Jodi Mcgee [birth year]  [id number].    Requesting MD: Dr. Marylu Lund Chief Complaint/Reason for Consult: psbo  HPI:   This is a 34 yo black female with a history of DM, ovarian cysts, PE, who underwent open appendectomy with subsequent ex lap in 2015 in [place] for perforated appendicitis.  She has also had an ex lap for ovarian cystectomy, and 3 c-sections.  She has a history of left and right sided flank pain by her chart secondary to these cysts and some component of chronic abdominal discomfort from multiple surgeries it sounds like, as she has multiple ED visits in the last year at least for some type of abdominal pain.  Do not see any evidence of GI evaluation  She started having left-sided flank pain about a weeks ago.  It was crampy in natures and turned to sharp.  She had some mild nausea, but no emesis.  She continued to have BMs and pass flatus.  She went to the ED for evaluation and it was felt to possibly be musculoskeletal as she had just gotten off of a church bus for a long trip.  She was discharged home.    Her pain persisted throughout the week, but her appetite seemed to decrease.  Her last BM was loose yesterday and she is still passing flatus.  She has never had emesis.  She presented back to the ED, [facility], last night for evaluation.  She underwent a CT scan that reveals 1 loop of dilated small bowel with a focal area of narrowing c/w possible partial obstruction. She has been admitted and we have been asked to see her.  ROS: ROS: Please see HPI, otherwise all other systems have been reviewed and are negative  Family History  Problem Relation Age of Onset  . Hypertension Mother   . Anemia Mother   . Diabetes Father     Past Medical History:  Diagnosis Date  . Anemia   . Anxiety   . Blood transfusion without reported diagnosis    both CS  . Diabetes mellitus without complication ([place])   . GERD (gastroesophageal reflux disease)    has resolved  . IBS  (irritable bowel syndrome)   . Ovarian cyst   . Peritonitis, acute generalized ([place])   . Pulmonary embolism ([place])   . Sepsis [facility])     Past Surgical History:  Procedure Laterality Date  . APPENDECTOMY     ruptured   . BUNIONECTOMY    . CESAREAN SECTION    . CESAREAN SECTION Bilateral 11/20/2017   Procedure: CESAREAN SECTION;  Surgeon: Sloan Leiter, MD;  Location: [place];  Service: Obstetrics;  Laterality: Bilateral;  . OVARIAN CYST DRAINAGE    . SMALL BOWEL REPAIR      Social History:  reports that she has been smoking cigarettes. She has been smoking about 0.50 packs per day. She has never used smokeless tobacco. She reports current alcohol use. She reports previous drug use.  Allergies:  Allergies  Allergen Reactions  . Cetirizine & Related Itching and Swelling  . Toradol [Ketorolac Tromethamine] Hives and Itching  . Flagyl [Metronidazole] Itching and Swelling  . Contrast Media [Iodinated Diagnostic Agents] Itching    Mild itching after IV contrast on 6/1/7 No urticaria visible No wheezing No angioedema  . Nsaids Rash    Medications Prior to Admission  Medication Sig Dispense Refill  . acetaminophen (TYLENOL) 325 MG tablet Take 650 mg by mouth every 6 (six)  hours as needed for mild pain or headache.    Marland Kitchen HUMULIN R 100 UNIT/ML injection Inject 0-20 Units into the skin 3 (three) times daily before meals. Per sliding scale    . insulin detemir (LEVEMIR) 100 unit/ml SOLN Inject 0.2 mLs (20 Units total) into the skin 2 (two) times daily. (Patient taking differently: Inject 20 Units into the skin 3 (three) times daily. ) 6 mL 0  . Levonorgestrel (LILETTA) 19.5 MCG/DAY IUD IUD 1 each by Intrauterine route continuous. Placed in 2019    . metFORMIN (GLUCOPHAGE) 1000 MG tablet Take 1 tablet (1,000 mg total) by mouth 2 (two) times daily with a meal. 60 tablet 0  . methocarbamol (ROBAXIN) 500 MG tablet Take 1 tablet (500 mg total) by mouth every 8 (eight) hours as needed  for muscle spasms. 20 tablet 0  . traMADol (ULTRAM) 50 MG tablet Take 1 tablet (50 mg total) by mouth every 6 (six) hours as needed. (Patient taking differently: Take 50 mg by mouth every 6 (six) hours as needed for moderate pain. ) 15 tablet 0  . blood glucose meter kit and supplies KIT Dispense based on patient and insurance preference. Use up to four times daily as directed. (FOR ICD-9 250.00, 250.01). 1 each 1  . cyclobenzaprine (FLEXERIL) 5 MG tablet Take 1 tablet (5 mg total) by mouth 3 (three) times daily as needed (muscle soreness). (Patient not taking: Reported on 05/10/2020) 30 tablet 0  . glipiZIDE (GLUCOTROL) 5 MG tablet Take 1 tablet (5 mg total) by mouth 2 (two) times daily before a meal. (Patient not taking: Reported on 12/16/2019) 60 tablet 0  . oxyCODONE-acetaminophen (PERCOCET/ROXICET) 5-325 MG tablet Take 1 tablet by mouth every 4 (four) hours as needed for severe pain. (Patient not taking: Reported on 05/10/2020) 30 tablet 0     Physical Exam: Blood pressure 106/83, pulse 83, temperature 97.8 F (36.6 C), temperature source Oral, resp. rate 16, height '5\' 6"'  (1.676 m), weight 86.2 kg, SpO2 99 %. General: pleasant, obese black female who is laying in bed in NAD HEENT: head is normocephalic, atraumatic.  Sclera are noninjected.  PERRL.  Ears and nose without any masses or lesions.  Mouth is pink and moist Heart: regular, rate, and rhythm.  Normal s1,s2. No obvious murmurs, gallops, or rubs noted.  Palpable radial and pedal pulses bilaterally Lungs: CTAB, no wheezes, rhonchi, or rales noted.  Respiratory effort nonlabored Abd: soft, NT, ND, +BS, no masses, hernias, or organomegaly.  Midline scar present from prior surgeries MS: all 4 extremities are symmetrical with no cyanosis, clubbing, or edema. Skin: warm and dry with no masses, lesions, or rashes Neuro: Cranial nerves 2-12 grossly intact, sensation is normal throughout Psych: A&Ox3 with an appropriate affect.   Results for  orders placed or performed during the hospital encounter of 05/09/20 (from the past 48 hour(s))  Lipase, blood     Status: None   Collection Time: 05/09/20  7:33 PM  Result Value Ref Range   Lipase 32 11 - 51 U/L    Comment: Performed at [facility], [address] [place], [place] [address]  Comprehensive metabolic panel     Status: Abnormal   Collection Time: 05/09/20  7:33 PM  Result Value Ref Range   Sodium 132 (L) 135 - 145 mmol/L   Potassium 3.4 (L) 3.5 - 5.1 mmol/L   Chloride 96 (L) 98 - 111 mmol/L   CO2 26 22 - 32 mmol/L   Glucose, Bld  467 (H) 70 - 99 mg/dL    Comment: Glucose reference range applies only to samples taken after fasting for at least 8 hours.   BUN 9 6 - 20 mg/dL   Creatinine, Ser 0.55 0.44 - 1.00 mg/dL   Calcium 8.6 (L) 8.9 - 10.3 mg/dL   Total Protein 7.0 6.5 - 8.1 g/dL   Albumin 3.3 (L) 3.5 - 5.0 g/dL   AST 16 15 - 41 U/L   ALT 15 0 - 44 U/L   Alkaline Phosphatase 75 38 - 126 U/L   Total Bilirubin 0.4 0.3 - 1.2 mg/dL   GFR calc non Af Amer >60 >60 mL/min   GFR calc Af Amer >60 >60 mL/min   Anion gap 10 5 - 15    Comment: Performed at [facility], [address] [place], [place] [address]  CBC     Status: None   Collection Time: 05/09/20  7:33 PM  Result Value Ref Range   WBC 5.1 4.0 - 10.5 K/uL   RBC 4.84 3.87 - 5.11 MIL/uL   Hemoglobin 13.3 12.0 - 15.0 g/dL   HCT 40.2 36 - 46 %   MCV 83.1 80.0 - 100.0 fL   MCH 27.5 26.0 - 34.0 pg   MCHC 33.1 30.0 - 36.0 g/dL   RDW 13.4 11.5 - 15.5 %   Platelets 295 150 - 400 K/uL   nRBC 0.0 0.0 - 0.2 %    Comment: Performed at [facility], [address] [place], [place] [address]  Urinalysis, Routine w reflex microscopic     Status: Abnormal   Collection Time: 05/09/20  9:14 PM  Result Value Ref Range   Color, Urine YELLOW YELLOW   APPearance CLEAR CLEAR   Specific Gravity, Urine 1.010 1.005 - 1.030   pH 7.0 5.0 - 8.0   Glucose, UA >=500 (A) NEGATIVE mg/dL   Hgb urine  dipstick NEGATIVE NEGATIVE   Bilirubin Urine NEGATIVE NEGATIVE   Ketones, ur NEGATIVE NEGATIVE mg/dL   Protein, ur NEGATIVE NEGATIVE mg/dL   Nitrite NEGATIVE NEGATIVE   Leukocytes,Ua NEGATIVE NEGATIVE    Comment: Performed at [facility], [address] [place], [place] [address]  Pregnancy, urine     Status: None   Collection Time: 05/09/20  9:14 PM  Result Value Ref Range   Preg Test, Ur NEGATIVE NEGATIVE    Comment:        THE SENSITIVITY OF THIS METHODOLOGY IS >20 mIU/mL. Performed at [facility], [address] [place], [place] [address]   Urinalysis, Microscopic (reflex)     Status: Abnormal   Collection Time: 05/09/20  9:14 PM  Result Value Ref Range   RBC / HPF 0-5 0 - 5 RBC/hpf   WBC, UA 0-5 0 - 5 WBC/hpf   Bacteria, UA RARE (A) NONE SEEN   Squamous Epithelial / LPF 0-5 0 - 5    Comment: Performed at [facility], [address] [place], [place] [address]  SARS Coronavirus 2 by RT PCR (hospital order, performed in [facility] hospital lab) Nasopharyngeal Nasopharyngeal Swab     Status: None   Collection Time: 05/09/20 11:16 PM   Specimen: Nasopharyngeal Swab  Result Value Ref Range   SARS Coronavirus 2 NEGATIVE NEGATIVE    Comment: (NOTE) SARS-CoV-2 target nucleic acids are NOT DETECTED.  The SARS-CoV-2 RNA is generally detectable in upper and lower respiratory specimens during the acute  phase of infection. The lowest concentration of SARS-CoV-2 viral copies this assay can detect is 250 copies / mL. A negative result does not preclude SARS-CoV-2 infection and should not be used as the sole basis for treatment or other patient management decisions.  A negative result may occur with improper specimen collection / handling, submission of specimen other than nasopharyngeal swab, presence of viral mutation(s) within the areas targeted by this assay, and inadequate number of viral copies (<250 copies / mL). A negative result must be  combined with clinical observations, patient history, and epidemiological information.  Fact Sheet for Patients:   StrictlyIdeas.no  Fact Sheet for Healthcare Providers: BankingDealers.co.za  This test is not yet approved or  cleared by the [place] FDA and has been authorized for detection and/or diagnosis of SARS-CoV-2 by FDA under an Emergency Use Authorization (EUA).  This EUA will remain in effect (meaning this test can be used) for the duration of the COVID-19 declaration under Section 564(b)(1) of the Act, 21 U.S.C. section 360bbb-3(b)(1), unless the authorization is terminated or revoked sooner.  Performed at [facility], [address] [place], [place] [address]   CBG monitoring, ED     Status: Abnormal   Collection Time: 05/10/20  4:57 AM  Result Value Ref Range   Glucose-Capillary 273 (H) 70 - 99 mg/dL    Comment: Glucose reference range applies only to samples taken after fasting for at least 8 hours.  Hemoglobin A1c     Status: Abnormal   Collection Time: 05/10/20 11:27 AM  Result Value Ref Range   Hgb A1c MFr Bld 13.4 (H) 4.8 - 5.6 %    Comment: (NOTE) Pre diabetes:          5.7%-6.4%  Diabetes:              >6.4%  Glycemic control for   <7.0% adults with diabetes    Mean Plasma Glucose 337.88 mg/dL    Comment: Performed at [facility] [address] [place], [place] [address]  HIV Antibody (routine testing w rflx)     Status: None   Collection Time: 05/10/20 11:27 AM  Result Value Ref Range   HIV Screen 4th Generation wRfx Non Reactive Non Reactive    Comment: Performed at [facility], [place] [address], [place], [place] [address]  CBC     Status: Abnormal   Collection Time: 05/10/20 11:27 AM  Result Value Ref Range   WBC 3.9 (L) 4.0 - 10.5 K/uL   RBC 4.65 3.87 - 5.11 MIL/uL   Hemoglobin 12.8 12.0 - 15.0 g/dL   HCT 39.6 36 - 46 %   MCV 85.2 80.0 - 100.0 fL   MCH 27.5 26.0 -  34.0 pg   MCHC 32.3 30.0 - 36.0 g/dL   RDW 13.4 11.5 - 15.5 %   Platelets 241 150 - 400 K/uL   nRBC 0.0 0.0 - 0.2 %    Comment: Performed at [facility], [place] [address] [place], [place] [address]  Creatinine, serum     Status: None   Collection Time: 05/10/20 11:27 AM  Result Value Ref Range   Creatinine, Ser 0.48 0.44 - 1.00 mg/dL   GFR calc non Af Amer >60 >60 mL/min   GFR calc Af Amer >60 >60 mL/min    Comment: Performed at [facility], [place] [address] [place], [place] [address]  Glucose, capillary     Status: Abnormal  Collection Time: 05/10/20 11:32 AM  Result Value Ref Range   Glucose-Capillary 283 (H) 70 - 99 mg/dL    Comment: Glucose reference range applies only to samples taken after fasting for at least 8 hours.   CT ABDOMEN PELVIS WO CONTRAST  Result Date: 05/09/2020 CLINICAL DATA:  Left lower quadrant pain EXAM: CT ABDOMEN AND PELVIS WITHOUT CONTRAST TECHNIQUE: Multidetector CT imaging of the abdomen and pelvis was performed following the standard protocol without IV contrast. COMPARISON:  January 11, 2020 FINDINGS: Lower chest: The visualized heart size within normal limits. No pericardial fluid/thickening. No hiatal hernia. The visualized portions of the lungs are clear. Hepatobiliary: Although limited due to the lack of intravenous contrast, normal in appearance without gross focal abnormality. No evidence of calcified gallstones or biliary ductal dilatation. Pancreas:  Unremarkable.  No surrounding inflammatory changes. Spleen: Normal in size. Although limited due to the lack of intravenous contrast, normal in appearance. Adrenals/Urinary Tract: Both adrenal glands appear normal. The kidneys and collecting system appear normal without evidence of urinary tract calculus or hydronephrosis. Bladder is unremarkable. Stomach/Bowel: The stomach is normal in appearance. There is mildly dilated air and fluid-filled loops ileum seen within the mid  abdomen measuring up to 3.6 cm. There is a focal area of narrowing seen within the distal ileal loops best seen on series 5, image 25. The remainder of the distal ileal loops are decompressed. The colon is unremarkable. The patient is status post appendectomy. Vascular/Lymphatic: There are no enlarged abdominal or pelvic lymph nodes. No significant gross vascular findings are present. Reproductive: IUD seen within the endometrial canal. Other: No evidence of abdominal wall mass or hernia. Musculoskeletal: No acute or significant osseous findings. IMPRESSION: Findings suggestive of partial small bowel obstruction with a focal area of narrowing seen within the distal ileal loops as described above. No focal area wall thickening or surrounding fat stranding changes. This could be due to adhesions. Electronically Signed   By: Prudencio Pair M.D.   On: 05/09/2020 22:05    Assessment/Plan DM H/O PE IBS Chronic abdominal pain  Abdominal pain, partial small bowel obstruction  The patient appears to have an area in her ileum with focal narrowing causing a dilatation of one loop of her small bowel concerning for possible partial small bowel obstruction.  She denies N/V and is still having flatus and diarrhea type stools.  This shows she does not have a complete blockage.  She could have a narrowing secondary to adhesive disease as she has had quite a bit of abdominal surgery.     The patient also has a history of IBS.  It is unclear if she has ever seen GI or had a GI work up to rule out any further GI issues such as crohn's etc.  For now, we will let her drink contrast and assure passage through the colon.  Once this occurs, we can advance her diet as tolerates.   She will likely need to see GI after discharge given her intermittent chronic symptoms that she presents to the ED for.    We will continue to follow.  FEN - NPO VTE - Lovenox ID - none needed  Henreitta Cea, [facility]  Surgery 05/10/2020, 2:01 PM Please see Amion for pager number during day hours 7:00am-4:30pm or 7:00am -11:30am on weekends  Agree with above. The CT scan is not that impressive.  She claims an iodine allergy, therefore is refusing gastrograffin.  This will limit our evaluation.  Alphonsa Overall,  MD, [facility] Surgery Office phone:  [phone]

## 2020-05-11 ENCOUNTER — Inpatient Hospital Stay (HOSPITAL_COMMUNITY): Payer: Medicaid Other

## 2020-05-11 DIAGNOSIS — K56600 Partial intestinal obstruction, unspecified as to cause: Secondary | ICD-10-CM | POA: Diagnosis not present

## 2020-05-11 LAB — COMPREHENSIVE METABOLIC PANEL
ALT: 15 U/L (ref 0–44)
AST: 18 U/L (ref 15–41)
Albumin: 3.5 g/dL (ref 3.5–5.0)
Alkaline Phosphatase: 59 U/L (ref 38–126)
Anion gap: 11 (ref 5–15)
BUN: 8 mg/dL (ref 6–20)
CO2: 26 mmol/L (ref 22–32)
Calcium: 8.9 mg/dL (ref 8.9–10.3)
Chloride: 104 mmol/L (ref 98–111)
Creatinine, Ser: 0.5 mg/dL (ref 0.44–1.00)
GFR calc Af Amer: >60 mL/min (ref >60)
GFR calc non Af Amer: >60 mL/min (ref >60)
Glucose, Bld: 138 mg/dL — ABNORMAL HIGH (ref 70–99)
Potassium: 3.1 mmol/L — ABNORMAL LOW (ref 3.5–5.1)
Sodium: 141 mmol/L (ref 135–145)
Total Bilirubin: 0.8 mg/dL (ref 0.3–1.2)
Total Protein: 6.9 g/dL (ref 6.5–8.1)

## 2020-05-11 LAB — GLUCOSE, CAPILLARY
Glucose-Capillary: 122 mg/dL — ABNORMAL HIGH (ref 70–99)
Glucose-Capillary: 128 mg/dL — ABNORMAL HIGH (ref 70–99)
Glucose-Capillary: 197 mg/dL — ABNORMAL HIGH (ref 70–99)
Glucose-Capillary: 244 mg/dL — ABNORMAL HIGH (ref 70–99)
Glucose-Capillary: 289 mg/dL — ABNORMAL HIGH (ref 70–99)
Glucose-Capillary: 314 mg/dL — ABNORMAL HIGH (ref 70–99)

## 2020-05-11 LAB — CBC
HCT: 40.3 % (ref 36.0–46.0)
Hemoglobin: 12.5 g/dL (ref 12.0–15.0)
MCH: 27.4 pg (ref 26.0–34.0)
MCHC: 31 g/dL (ref 30.0–36.0)
MCV: 88.2 fL (ref 80.0–100.0)
Platelets: 270 10*3/uL (ref 150–400)
RBC: 4.57 MIL/uL (ref 3.87–5.11)
RDW: 13.7 % (ref 11.5–15.5)
WBC: 4 10*3/uL (ref 4.0–10.5)
nRBC: 0 % (ref 0.0–0.2)

## 2020-05-11 MED ORDER — TRAMADOL HCL 50 MG PO TABS
50.0000 mg | ORAL_TABLET | Freq: Four times a day (QID) | ORAL | Status: DC | PRN
  Administered 2020-05-11 – 2020-05-12 (×4): 50 mg via ORAL
  Filled 2020-05-11 (×4): qty 1

## 2020-05-11 MED ORDER — POLYETHYLENE GLYCOL 3350 17 G PO PACK
17.0000 g | PACK | Freq: Every day | ORAL | Status: DC
  Administered 2020-05-11: 17 g via ORAL
  Filled 2020-05-11 (×3): qty 1

## 2020-05-11 MED ORDER — POTASSIUM CHLORIDE 20 MEQ PO PACK
40.0000 meq | PACK | Freq: Two times a day (BID) | ORAL | Status: AC
  Administered 2020-05-11 (×2): 40 meq via ORAL
  Filled 2020-05-11 (×2): qty 2

## 2020-05-11 MED ORDER — POTASSIUM CHLORIDE CRYS ER 20 MEQ PO TBCR
40.0000 meq | EXTENDED_RELEASE_TABLET | Freq: Two times a day (BID) | ORAL | Status: DC
  Administered 2020-05-11: 40 meq via ORAL
  Filled 2020-05-11: qty 2

## 2020-05-11 NOTE — Progress Notes (Signed)
PROGRESS NOTE    Jodi Mcgee  QHU:[id number]  DOB: 23-Sep-1986  DOA: 05/09/2020 PCP: [facility]:   Hospital course:  Patient is a 34 year old female with a history of diabetes, PE, anemia and GERD who is presented to the ED 05/10/2020 with persistent abdominal pain, localized in the left lower quadrant. She was having frequent attacks of loose stool but only a small amount at a time. She had nausea but no vomiting and reports she has been unable to eat.   Evaluation in the ED included a CT abdomen pelvis which revealed "partial small bowel obstruction with focal area of narrowing seen within the distal ileal loops".  She did not have any areas of wall thickening or fat stranding.  Thought to be secondary to adhesions.  Patient was admitted for management of partial SBO.  Subjective:  Today patient states that she does feel somewhat better.  Abdominal pain is improved but not resolved.  She was able to tolerate full liquids earlier today with no vomiting.  She also thinks her nausea is improved.  She states however that she is worried that she will have worsened abdominal pain and/or nausea/vomiting if she is to eat solid food.  Does not feel like she can go home today "I have got 3 children at home and I cannot be sick anymore".  Objective: Vitals:   05/10/20 1838 05/10/20 2025 05/11/20 0406 05/11/20 1347  BP: (!) 107/54 106/68 107/78 112/77  Pulse: (!) 57 62 (!) 56 63  Resp: 15 15 15 18   Temp: 97.7 F (36.5 C) 97.8 F (36.6 C) (!) 97.5 F (36.4 C) 98 F (36.7 C)  TempSrc: Oral  Oral   SpO2: 100% 96% 100% 100%  Weight:      Height:       No intake or output data in the 24 hours ending 05/11/20 1426 Filed Weights   05/09/20 1918  Weight: 86.2 kg     Exam:  General: Relatively well-appearing female sitting up in bed in no acute distress with liquid diet at bedside, partially finished. Eyes: sclera anicteric,  conjuctiva mild injection bilaterally CVS: S1-S2, regular  Respiratory:  decreased air entry bilaterally secondary to decreased inspiratory effort, rales at bases  GI: Patient does have bowel sounds, they are diminished however are normal in intensity and tone.  Her abdomen is obese but soft.  Nontender to light or deep palpation.  No rebound tenderness. LE: No edema.  Neuro: A/O x 3, Moving all extremities equally with normal strength, CN 3-12 intact, grossly nonfocal.  Psych: patient is logical and coherent, judgement and insight appear normal, mood and affect appropriate to situation.   Assessment & Plan:   SBO Patient seen by general surgery earlier today who note that she is improving with conservative management.  They recommend advancing diet and discharging when patient able to keep p.o.'s. Patient is willing to try soft solid diet however does not feel like she would feel comfortable going home today in case she has worsened abdominal pain and vomiting again when she eats. Will advance diet to soft solids at lunch and follow patient Discontinue IV fluid resuscitation Discontinue IV narcotics Restart tramadol 50 mg as per home doses  Hyponatremia Resolved with fluid resuscitation  Hypokalemia We will replete and recheck in the morning.  DM2 CBGs under reasonable control on moderate dose SSI every 4 hours Will change to [facility] at bedtime higher dose once patient starts eating Continue  to hold glipizide and Metformin Can restart detemir 20 units twice daily to 3 times daily as per home doses once patient is eating fully.   DVT prophylaxis: Lovenox Code Status: Full Family Communication: Patient states she will contact her family, was speaking to them on phone while I was in the room. Disposition Plan:   Patient is from: Home  Anticipated Discharge Location: Home  Barriers to Discharge: Resolving SBO  Is patient medically stable for Discharge: Not  yet   Consultants:  General surgery  Procedures:  None  Antimicrobials:  None   Data Reviewed:  Basic Metabolic Panel: Recent Labs  Lab 05/09/20 1933 05/10/20 1127 05/11/20 0447  NA 132*  --  141  K 3.4*  --  3.1*  CL 96*  --  104  CO2 26  --  26  GLUCOSE 467*  --  138*  BUN 9  --  8  CREATININE 0.55 0.48 0.50  CALCIUM 8.6*  --  8.9   Liver Function Tests: Recent Labs  Lab 05/09/20 1933 05/11/20 0447  AST 16 18  ALT 15 15  ALKPHOS 75 59  BILITOT 0.4 0.8  PROT 7.0 6.9  ALBUMIN 3.3* 3.5   Recent Labs  Lab 05/09/20 1933  LIPASE 32   No results for input(s): AMMONIA in the last 168 hours. CBC: Recent Labs  Lab 05/09/20 1933 05/10/20 1127 05/11/20 0447  WBC 5.1 3.9* 4.0  HGB 13.3 12.8 12.5  HCT 40.2 39.6 40.3  MCV 83.1 85.2 88.2  PLT 295 241 270   Cardiac Enzymes: No results for input(s): CKTOTAL, CKMB, CKMBINDEX, TROPONINI in the last 168 hours. BNP (last 3 results) No results for input(s): PROBNP in the last 8760 hours. CBG: Recent Labs  Lab 05/10/20 2022 05/10/20 2343 05/11/20 0410 05/11/20 0758 05/11/20 1206  GLUCAP 141* 119* 128* 122* 244*    Recent Results (from the past 240 hour(s))  SARS Coronavirus 2 by RT PCR (hospital order, performed in [facility] hospital lab) Nasopharyngeal Nasopharyngeal Swab     Status: None   Collection Time: 05/09/20 11:16 PM   Specimen: Nasopharyngeal Swab  Result Value Ref Range Status   SARS Coronavirus 2 NEGATIVE NEGATIVE Final    Comment: (NOTE) SARS-CoV-2 target nucleic acids are NOT DETECTED.  The SARS-CoV-2 RNA is generally detectable in upper and lower respiratory specimens during the acute phase of infection. The lowest concentration of SARS-CoV-2 viral copies this assay can detect is 250 copies / mL. A negative result does not preclude SARS-CoV-2 infection and should not be used as the sole basis for treatment or other patient management decisions.  A negative result may occur  with improper specimen collection / handling, submission of specimen other than nasopharyngeal swab, presence of viral mutation(s) within the areas targeted by this assay, and inadequate number of viral copies (<250 copies / mL). A negative result must be combined with clinical observations, patient history, and epidemiological information.  Fact Sheet for Patients:   StrictlyIdeas.no  Fact Sheet for Healthcare Providers: BankingDealers.co.za  This test is not yet approved or  cleared by the [place] FDA and has been authorized for detection and/or diagnosis of SARS-CoV-2 by FDA under an Emergency Use Authorization (EUA).  This EUA will remain in effect (meaning this test can be used) for the duration of the COVID-19 declaration under Section 564(b)(1) of the Act, 21 U.S.C. section 360bbb-3(b)(1), unless the authorization is terminated or revoked sooner.  Performed at [facility], [address] [place], [place] [address]       Studies: CT ABDOMEN PELVIS WO CONTRAST  Result Date: 05/09/2020 CLINICAL DATA:  Left lower quadrant pain EXAM: CT ABDOMEN AND PELVIS WITHOUT CONTRAST TECHNIQUE: Multidetector CT imaging of the abdomen and pelvis was performed following the standard protocol without IV contrast. COMPARISON:  January 11, 2020 FINDINGS: Lower chest: The visualized heart size within normal limits. No pericardial fluid/thickening. No hiatal hernia. The visualized portions of the lungs are clear. Hepatobiliary: Although limited due to the lack of intravenous contrast, normal in appearance without gross focal abnormality. No evidence of calcified gallstones or biliary ductal dilatation. Pancreas:  Unremarkable.  No surrounding inflammatory changes. Spleen: Normal in size. Although limited due to the lack of intravenous contrast, normal in appearance. Adrenals/Urinary Tract: Both adrenal glands appear normal. The kidneys and  collecting system appear normal without evidence of urinary tract calculus or hydronephrosis. Bladder is unremarkable. Stomach/Bowel: The stomach is normal in appearance. There is mildly dilated air and fluid-filled loops ileum seen within the mid abdomen measuring up to 3.6 cm. There is a focal area of narrowing seen within the distal ileal loops best seen on series 5, image 25. The remainder of the distal ileal loops are decompressed. The colon is unremarkable. The patient is status post appendectomy. Vascular/Lymphatic: There are no enlarged abdominal or pelvic lymph nodes. No significant gross vascular findings are present. Reproductive: IUD seen within the endometrial canal. Other: No evidence of abdominal wall mass or hernia. Musculoskeletal: No acute or significant osseous findings. IMPRESSION: Findings suggestive of partial small bowel obstruction with a focal area of narrowing seen within the distal ileal loops as described above. No focal area wall thickening or surrounding fat stranding changes. This could be due to adhesions. Electronically Signed   By: Prudencio Pair M.D.   On: 05/09/2020 22:05   DG Abd Portable 1V  Result Date: 05/11/2020 CLINICAL DATA:  Partial small bowel obstruction. EXAM: PORTABLE ABDOMEN - 1 VIEW COMPARISON:  CT abdomen and pelvis 05/09/2020 FINDINGS: Gas and stool are present in nondilated colon. No dilated small bowel loops are evident. A surgical clip projects in the left lower quadrant. No acute osseous abnormality is seen. IMPRESSION: No evidence of bowel obstruction. Electronically Signed   By: Logan Bores M.D.   On: 05/11/2020 08:22     Scheduled Meds: . enoxaparin (LOVENOX) injection  40 mg Subcutaneous Q24H  . insulin aspart  0-15 Units Subcutaneous Q4H  . polyethylene glycol  17 g Oral Daily  . potassium chloride  40 mEq Oral BID   Continuous Infusions:  Principal Problem:   Partial small bowel obstruction (HCC) Active Problems:   Type 2 diabetes  mellitus, without long-term current use of insulin (HCC)   Obesity (BMI 30-39.9)   Pelvic adhesive disease   Chronic hypertension   Controlled type 2 diabetes mellitus with hyperglycemia ([place])     Danyle Boening Derek Jack, [facility]  If 7PM-7AM, please contact night-coverage [email or web address] Password [email or web address] 05/11/2020, 2:26 PM    LOS: 1 day

## 2020-05-11 NOTE — Progress Notes (Addendum)
[facility] Progress Note     Subjective: CC:   Still with mild, cramping abdominal pain in the upper abdomen and LLQ. Reports a lot of flatus. Denies BM. Denies nausea, emesis.    Confirms her history of perforated appendicitis requiring laparotomy, cesarean section x3, and ovarian cystectomy.   Objective: Vital signs in last 24 hours: Temp:  [97.5 F (36.4 C)-98 F (36.7 C)] 97.5 F (36.4 C) (07/20 0406) Pulse Rate:  [56-83] 56 (07/20 0406) Resp:  [15-16] 15 (07/20 0406) BP: (106-134)/(54-84) 107/78 (07/20 0406) SpO2:  [96 %-100 %] 100 % (07/20 0406) Last BM Date: 05/10/20  Intake/Output from previous day: No intake/output data recorded. Intake/Output this shift: No intake/output data recorded.  PE: Gen:  Alert, NAD, pleasant Card:  Regular rate and rhythm, pedal pulses 2+ BL  Pulm:  Normal effort, clear to auscultation bilaterally Abd: Soft, mild distention, mild mostly subjective tenderness to LLQ, no rebound, guarding, or peritonitis. Skin: warm and dry, no rashes  Psych: A&Ox3   Lab Results:  Recent Labs    05/10/20 1127 05/11/20 0447  WBC 3.9* 4.0  HGB 12.8 12.5  HCT 39.6 40.3  PLT 241 270   BMET Recent Labs    05/09/20 1933 05/09/20 1933 05/10/20 1127 05/11/20 0447  NA 132*  --   --  141  K 3.4*  --   --  3.1*  CL 96*  --   --  104  CO2 26  --   --  26  GLUCOSE 467*  --   --  138*  BUN 9  --   --  8  CREATININE 0.55   < > 0.48 0.50  CALCIUM 8.6*  --   --  8.9   < > = values in this interval not displayed.   PT/INR No results for input(s): LABPROT, INR in the last 72 hours. CMP     Component Value Date/Time   NA 141 05/11/2020 0447   NA 138 11/12/2017 0955   NA 143 06/26/2014 0116   K 3.1 (L) 05/11/2020 0447   K 3.4 (L) 06/26/2014 0116   CL 104 05/11/2020 0447   CL 106 06/26/2014 0116   CO2 26 05/11/2020 0447   CO2 29 06/26/2014 0116   GLUCOSE 138 (H) 05/11/2020 0447   GLUCOSE 91 06/26/2014 0116   BUN 8 05/11/2020 0447    BUN 7 11/12/2017 0955   BUN 10 06/26/2014 0116   CREATININE 0.50 05/11/2020 0447   CREATININE 0.89 06/26/2014 0116   CALCIUM 8.9 05/11/2020 0447   CALCIUM 8.6 06/26/2014 0116   PROT 6.9 05/11/2020 0447   PROT 6.2 11/12/2017 0955   ALBUMIN 3.5 05/11/2020 0447   ALBUMIN 3.1 (L) 11/12/2017 0955   AST 18 05/11/2020 0447   ALT 15 05/11/2020 0447   ALKPHOS 59 05/11/2020 0447   BILITOT 0.8 05/11/2020 0447   BILITOT 0.2 11/12/2017 0955   GFRNONAA >60 05/11/2020 0447   GFRNONAA >60 06/26/2014 0116   GFRAA >60 05/11/2020 0447   GFRAA >60 06/26/2014 0116   Lipase     Component Value Date/Time   LIPASE 32 05/09/2020 1933       Studies/Results: CT ABDOMEN PELVIS WO CONTRAST  Result Date: 05/09/2020 CLINICAL DATA:  Left lower quadrant pain EXAM: CT ABDOMEN AND PELVIS WITHOUT CONTRAST TECHNIQUE: Multidetector CT imaging of the abdomen and pelvis was performed following the standard protocol without IV contrast. COMPARISON:  January 11, 2020 FINDINGS: Lower chest: The visualized heart size within normal  limits. No pericardial fluid/thickening. No hiatal hernia. The visualized portions of the lungs are clear. Hepatobiliary: Although limited due to the lack of intravenous contrast, normal in appearance without gross focal abnormality. No evidence of calcified gallstones or biliary ductal dilatation. Pancreas:  Unremarkable.  No surrounding inflammatory changes. Spleen: Normal in size. Although limited due to the lack of intravenous contrast, normal in appearance. Adrenals/Urinary Tract: Both adrenal glands appear normal. The kidneys and collecting system appear normal without evidence of urinary tract calculus or hydronephrosis. Bladder is unremarkable. Stomach/Bowel: The stomach is normal in appearance. There is mildly dilated air and fluid-filled loops ileum seen within the mid abdomen measuring up to 3.6 cm. There is a focal area of narrowing seen within the distal ileal loops best seen on series  5, image 25. The remainder of the distal ileal loops are decompressed. The colon is unremarkable. The patient is status post appendectomy. Vascular/Lymphatic: There are no enlarged abdominal or pelvic lymph nodes. No significant gross vascular findings are present. Reproductive: IUD seen within the endometrial canal. Other: No evidence of abdominal wall mass or hernia. Musculoskeletal: No acute or significant osseous findings. IMPRESSION: Findings suggestive of partial small bowel obstruction with a focal area of narrowing seen within the distal ileal loops as described above. No focal area wall thickening or surrounding fat stranding changes. This could be due to adhesions. Electronically Signed   By: Prudencio Pair M.D.   On: 05/09/2020 22:05    Anti-infectives: Anti-infectives (From admission, onward)   None     Assessment/Plan DM H/O PE IBS Chronic abdominal pain  Abdominal pain, partial small bowel obstruction  - pain improving, now having some bowel function (flatus), await BM  - KUB improved with non-obstructive pattern and the colon is full of air - start FLD and ADAT to SOFT - add miralax for patients self-reported history of constipation dating back to childhood   FEN: FLD, hypokalemia (3.1) replete with 40 mEq PO x 2 doses  ID: none VTE: lovenox  Dispo: floor, clinically improving, advance diet   LOS: 1 day    Obie Dredge, PA-C [facility] Please see Amion for pager number during day hours 7:00am-4:30pm  Agree with above. Her KUB shows air in colon with no evidence of SBO. She sees Daphane Shepherd at [place] at [facility] for her PCP.  She has some chronic GI complaints and may need to see someone from GI, but this does not need to be done during this hospitalization.  Alphonsa Overall, MD, [facility] Office phone:  [phone]

## 2020-05-12 DIAGNOSIS — E1165 Type 2 diabetes mellitus with hyperglycemia: Secondary | ICD-10-CM | POA: Diagnosis not present

## 2020-05-12 DIAGNOSIS — K56600 Partial intestinal obstruction, unspecified as to cause: Secondary | ICD-10-CM | POA: Diagnosis not present

## 2020-05-12 LAB — GLUCOSE, CAPILLARY
Glucose-Capillary: 114 mg/dL — ABNORMAL HIGH (ref 70–99)
Glucose-Capillary: 142 mg/dL — ABNORMAL HIGH (ref 70–99)
Glucose-Capillary: 196 mg/dL — ABNORMAL HIGH (ref 70–99)

## 2020-05-12 LAB — BASIC METABOLIC PANEL
Anion gap: 9 (ref 5–15)
BUN: 12 mg/dL (ref 6–20)
CO2: 26 mmol/L (ref 22–32)
Calcium: 8.9 mg/dL (ref 8.9–10.3)
Chloride: 103 mmol/L (ref 98–111)
Creatinine, Ser: 0.61 mg/dL (ref 0.44–1.00)
GFR calc Af Amer: >60 mL/min (ref >60)
GFR calc non Af Amer: >60 mL/min (ref >60)
Glucose, Bld: 126 mg/dL — ABNORMAL HIGH (ref 70–99)
Potassium: 3.4 mmol/L — ABNORMAL LOW (ref 3.5–5.1)
Sodium: 138 mmol/L (ref 135–145)

## 2020-05-12 MED ORDER — POLYETHYLENE GLYCOL 3350 17 G PO PACK: 17.0000 g | PACK | Freq: Every day | ORAL | 0 refills | Status: DC

## 2020-05-12 MED ORDER — TRAMADOL HCL 50 MG PO TABS: 50.0000 mg | ORAL_TABLET | Freq: Two times a day (BID) | ORAL | 0 refills | Status: AC | PRN

## 2020-05-12 MED ORDER — INSULIN DETEMIR 100 UNIT/ML FLEXPEN: 20.0000 [IU] | Freq: Three times a day (TID) | SUBCUTANEOUS | Status: DC

## 2020-05-12 MED ORDER — POTASSIUM CHLORIDE 20 MEQ PO PACK
40.0000 meq | PACK | Freq: Once | ORAL | Status: AC
  Administered 2020-05-12: 40 meq via ORAL
  Filled 2020-05-12: qty 2

## 2020-05-12 MED ORDER — MAGNESIUM CITRATE PO SOLN
0.5000 | Freq: Once | ORAL | Status: AC
  Administered 2020-05-12: 0.5 via ORAL
  Filled 2020-05-12: qty 296

## 2020-05-12 NOTE — Discharge Summary (Signed)
Physician Discharge Summary  Jodi Mcgee YJE:[id number] DOB: Feb 18, 1986 DOA: 05/09/2020  PCP: [facility]  Admit date: 05/09/2020 Discharge date: 05/12/2020  Recommendations for Outpatient Follow-up:  1. F/u routine care   Follow-up Information    [facility] [place]. Schedule an appointment as soon as possible for a visit in 1 week(s).   Specialty: Family Medicine Why: 1 week Contact information: [address] [place] [id number] [phone]                Discharge Diagnoses: Principal diagnosis is #1 1. Partial SBO 2. Hyponatremia 3. DM type 2   Discharge Condition: improved Disposition: home  Diet recommendation: diabetic diet  Filed Weights   05/09/20 1918  Weight: 86.2 kg    History of present illness:  33yow presented with abd pain, admitted for PSBO.   Hospital Course:  Patient was followed by surgery, managed conservatively with spontaneous resolution of partial small bowel obstruction.  She was cleared by surgery for discharge.  Hospitalization uncomplicated.  PSBO. PMH ex lap 2015 for perforated appendicitis.  --resolved on AXR, passing gas, only small stool --diet advanced per surgery and clear for discharge; was Rx'd Mag citrate today per surgery --Miralax on d/c for history of constipation long-standing. Consider outpatient GI referral.   Hyponatremia --secondary to poor oral intake, resolved  Hypokalemia --trivial, no further evaluation  DM type 2 with hyperglycemia --initially hyperglycemic, but this resolved w/ care --CBG stable now --resume Levemir, glipizide and Metformin on discharge   Consults:  . General surgery    Procedures:  . None  Today's assessment: S: feels better, eating a lot, still some upper abd pain, passing gas. Small BM noted. Drank Mag citrate and taking a dose of Miralax soon. O: Vitals:  Vitals:   05/11/20 1938 05/12/20  0359  BP: 124/82 131/83  Pulse: 74 71  Resp: 18 15  Temp: 99.1 F (37.3 C) 98 F (36.7 C)  SpO2: 99% 97%    Constitutional:  . Appears calm and comfortable Respiratory:  . CTA bilaterally, no w/r/r.  . Respiratory effort normal.  Cardiovascular:  . RRR, no m/r/g Abdomen:  . Soft, ntnd Psychiatric:  . judgement and insight appear normal . Mental status o Mood, affect appropriate  Potassium 3.4, remainder BMP unremarkable CBG stable  Discharge Instructions  Discharge Instructions    Diet - low sodium heart healthy   Complete by: As directed    Diet Carb Modified   Complete by: As directed    Discharge instructions   Complete by: As directed    Call your physician or seek immediate medical attention for abd pain, vomiting, fever or worsening pain.   Increase activity slowly   Complete by: As directed      Allergies as of 05/12/2020      Reactions   Cetirizine & Related Itching, Swelling   Toradol [ketorolac Tromethamine] Hives, Itching   Flagyl [metronidazole] Itching, Swelling   Contrast Media [iodinated Diagnostic Agents] Itching   Mild itching after IV contrast on 6/1/7 No urticaria visible No wheezing No angioedema   Nsaids Rash      Medication List    STOP taking these medications   cyclobenzaprine 5 MG tablet Commonly known as: FLEXERIL   glipiZIDE 5 MG tablet Commonly known as: GLUCOTROL   oxyCODONE-acetaminophen 5-325 MG tablet Commonly known as: PERCOCET/ROXICET     TAKE these medications   acetaminophen 325 MG tablet Commonly known as:  TYLENOL Take 650 mg by mouth every 6 (six) hours as needed for mild pain or headache.   blood glucose meter kit and supplies Kit Dispense based on patient and insurance preference. Use up to four times daily as directed. (FOR ICD-9 250.00, 250.01).   HumuLIN R 100 units/mL injection Generic drug: insulin regular Inject 0-20 Units into the skin 3 (three) times daily before meals. Per sliding scale     insulin detemir 100 unit/ml Soln Commonly known as: LEVEMIR Inject 0.2 mLs (20 Units total) into the skin 3 (three) times daily.   levonorgestrel 19.5 MCG/DAY Iud IUD Commonly known as: LILETTA 1 each by Intrauterine route continuous. Placed in 2019   metFORMIN 1000 MG tablet Commonly known as: GLUCOPHAGE Take 1 tablet (1,000 mg total) by mouth 2 (two) times daily with a meal.   methocarbamol 500 MG tablet Commonly known as: ROBAXIN Take 1 tablet (500 mg total) by mouth every 8 (eight) hours as needed for muscle spasms.   polyethylene glycol 17 g packet Commonly known as: MIRALAX / GLYCOLAX Take 17 g by mouth daily. Start taking on: May 13, 2020   traMADol 50 MG tablet Commonly known as: ULTRAM Take 1 tablet (50 mg total) by mouth every 12 (twelve) hours as needed for up to 3 days for moderate pain. What changed:   when to take this  reasons to take this      Allergies  Allergen Reactions  . Cetirizine & Related Itching and Swelling  . Toradol [Ketorolac Tromethamine] Hives and Itching  . Flagyl [Metronidazole] Itching and Swelling  . Contrast Media [Iodinated Diagnostic Agents] Itching    Mild itching after IV contrast on 6/1/7 No urticaria visible No wheezing No angioedema  . Nsaids Rash    The results of significant diagnostics from this hospitalization (including imaging, microbiology, ancillary and laboratory) are listed below for reference.    Significant Diagnostic Studies: CT ABDOMEN PELVIS WO CONTRAST  Result Date: 05/09/2020 CLINICAL DATA:  Left lower quadrant pain EXAM: CT ABDOMEN AND PELVIS WITHOUT CONTRAST TECHNIQUE: Multidetector CT imaging of the abdomen and pelvis was performed following the standard protocol without IV contrast. COMPARISON:  January 11, 2020 FINDINGS: Lower chest: The visualized heart size within normal limits. No pericardial fluid/thickening. No hiatal hernia. The visualized portions of the lungs are clear. Hepatobiliary: Although  limited due to the lack of intravenous contrast, normal in appearance without gross focal abnormality. No evidence of calcified gallstones or biliary ductal dilatation. Pancreas:  Unremarkable.  No surrounding inflammatory changes. Spleen: Normal in size. Although limited due to the lack of intravenous contrast, normal in appearance. Adrenals/Urinary Tract: Both adrenal glands appear normal. The kidneys and collecting system appear normal without evidence of urinary tract calculus or hydronephrosis. Bladder is unremarkable. Stomach/Bowel: The stomach is normal in appearance. There is mildly dilated air and fluid-filled loops ileum seen within the mid abdomen measuring up to 3.6 cm. There is a focal area of narrowing seen within the distal ileal loops best seen on series 5, image 25. The remainder of the distal ileal loops are decompressed. The colon is unremarkable. The patient is status post appendectomy. Vascular/Lymphatic: There are no enlarged abdominal or pelvic lymph nodes. No significant gross vascular findings are present. Reproductive: IUD seen within the endometrial canal. Other: No evidence of abdominal wall mass or hernia. Musculoskeletal: No acute or significant osseous findings. IMPRESSION: Findings suggestive of partial small bowel obstruction with a focal area of narrowing seen within the distal ileal loops as  described above. No focal area wall thickening or surrounding fat stranding changes. This could be due to adhesions. Electronically Signed   By: Prudencio Pair M.D.   On: 05/09/2020 22:05   DG Abd Portable 1V  Result Date: 05/11/2020 CLINICAL DATA:  Partial small bowel obstruction. EXAM: PORTABLE ABDOMEN - 1 VIEW COMPARISON:  CT abdomen and pelvis 05/09/2020 FINDINGS: Gas and stool are present in nondilated colon. No dilated small bowel loops are evident. A surgical clip projects in the left lower quadrant. No acute osseous abnormality is seen. IMPRESSION: No evidence of bowel obstruction.  Electronically Signed   By: Logan Bores M.D.   On: 05/11/2020 08:22    Microbiology: Recent Results (from the past 240 hour(s))  SARS Coronavirus 2 by RT PCR (hospital order, performed in [facility] hospital lab) Nasopharyngeal Nasopharyngeal Swab     Status: None   Collection Time: 05/09/20 11:16 PM   Specimen: Nasopharyngeal Swab  Result Value Ref Range Status   SARS Coronavirus 2 NEGATIVE NEGATIVE Final    Comment: (NOTE) SARS-CoV-2 target nucleic acids are NOT DETECTED.  The SARS-CoV-2 RNA is generally detectable in upper and lower respiratory specimens during the acute phase of infection. The lowest concentration of SARS-CoV-2 viral copies this assay can detect is 250 copies / mL. A negative result does not preclude SARS-CoV-2 infection and should not be used as the sole basis for treatment or other patient management decisions.  A negative result may occur with improper specimen collection / handling, submission of specimen other than nasopharyngeal swab, presence of viral mutation(s) within the areas targeted by this assay, and inadequate number of viral copies (<250 copies / mL). A negative result must be combined with clinical observations, patient history, and epidemiological information.  Fact Sheet for Patients:   StrictlyIdeas.no  Fact Sheet for Healthcare Providers: BankingDealers.co.za  This test is not yet approved or  cleared by the [place] FDA and has been authorized for detection and/or diagnosis of SARS-CoV-2 by FDA under an Emergency Use Authorization (EUA).  This EUA will remain in effect (meaning this test can be used) for the duration of the COVID-19 declaration under Section 564(b)(1) of the Act, 21 U.S.C. section 360bbb-3(b)(1), unless the authorization is terminated or revoked sooner.  Performed at [facility], [address] [place], [place] [address]      Labs: Basic Metabolic  Panel: Recent Labs  Lab 05/09/20 1933 05/10/20 1127 05/11/20 0447 05/12/20 0343  NA 132*  --  141 138  K 3.4*  --  3.1* 3.4*  CL 96*  --  104 103  CO2 26  --  26 26  GLUCOSE 467*  --  138* 126*  BUN 9  --  8 12  CREATININE 0.55 0.48 0.50 0.61  CALCIUM 8.6*  --  8.9 8.9   Liver Function Tests: Recent Labs  Lab 05/09/20 1933 05/11/20 0447  AST 16 18  ALT 15 15  ALKPHOS 75 59  BILITOT 0.4 0.8  PROT 7.0 6.9  ALBUMIN 3.3* 3.5   Recent Labs  Lab 05/09/20 1933  LIPASE 32   CBC: Recent Labs  Lab 05/09/20 1933 05/10/20 1127 05/11/20 0447  WBC 5.1 3.9* 4.0  HGB 13.3 12.8 12.5  HCT 40.2 39.6 40.3  MCV 83.1 85.2 88.2  PLT 295 241 270   CBG: Recent Labs  Lab 05/11/20 2048 05/11/20 2336 05/12/20 0358 05/12/20 0738 05/12/20 1236  GLUCAP 289* 314* 114* 142* 196*    Principal Problem:  Partial small bowel obstruction (HCC) Active Problems:   Type 2 diabetes mellitus, without long-term current use of insulin (HCC)   Obesity (BMI 30-39.9)   Pelvic adhesive disease   Chronic hypertension   Controlled type 2 diabetes mellitus with hyperglycemia ([place])   Time coordinating discharge: 35 minutes  Signed:  Murray Hodgkins, MD  [facility]  05/12/2020, 6:55 PM

## 2020-05-12 NOTE — Progress Notes (Addendum)
[facility] Progress Note     Subjective: CC:  Jodi Mcgee. Tolerating SOFT diet without nausea/emesis. Reports she ate a lot yesterday because she was very hungry and it did cause some mild cramping pain but she is not having pain currently. Reports a small, non-bloody BM yesterday but feels constipated. Still having flatus.   Objective: Vital signs in last 24 hours: Temp:  [98 F (36.7 C)-99.1 F (37.3 C)] 98 F (36.7 C) (07/21 0359) Pulse Rate:  [63-74] 71 (07/21 0359) Resp:  [15-18] 15 (07/21 0359) BP: (112-131)/(77-83) 131/83 (07/21 0359) SpO2:  [97 %-100 %] 97 % (07/21 0359) Last BM Date: 05/11/20  Intake/Output from previous day: 07/20 0701 - 07/21 0700 In: 2300.9 [I.V.:2300.9] Out: -  Intake/Output this shift: No intake/output data recorded.  PE: Gen:  Alert, NAD, pleasant Card:  Regular rate and rhythm, pedal pulses 2+ BL  Pulm:  Normal effort, clear to auscultation bilaterally Abd: Soft, nontender, nondistended, no rebound, guarding, or peritonitis. Skin: warm and dry, no rashes  Psych: A&Ox3   Lab Results:  Recent Labs    05/10/20 1127 05/11/20 0447  WBC 3.9* 4.0  HGB 12.8 12.5  HCT 39.6 40.3  PLT 241 270   BMET Recent Labs    05/11/20 0447 05/12/20 0343  NA 141 138  K 3.1* 3.4*  CL 104 103  CO2 26 26  GLUCOSE 138* 126*  BUN 8 12  CREATININE 0.50 0.61  CALCIUM 8.9 8.9   PT/INR No results for input(s): LABPROT, INR in the last 72 hours. CMP     Component Value Date/Time   NA 138 05/12/2020 0343   NA 138 11/12/2017 0955   NA 143 06/26/2014 0116   K 3.4 (L) 05/12/2020 0343   K 3.4 (L) 06/26/2014 0116   CL 103 05/12/2020 0343   CL 106 06/26/2014 0116   CO2 26 05/12/2020 0343   CO2 29 06/26/2014 0116   GLUCOSE 126 (H) 05/12/2020 0343   GLUCOSE 91 06/26/2014 0116   BUN 12 05/12/2020 0343   BUN 7 11/12/2017 0955   BUN 10 06/26/2014 0116   CREATININE 0.61 05/12/2020 0343   CREATININE 0.89 06/26/2014 0116   CALCIUM 8.9 05/12/2020  0343   CALCIUM 8.6 06/26/2014 0116   PROT 6.9 05/11/2020 0447   PROT 6.2 11/12/2017 0955   ALBUMIN 3.5 05/11/2020 0447   ALBUMIN 3.1 (L) 11/12/2017 0955   AST 18 05/11/2020 0447   ALT 15 05/11/2020 0447   ALKPHOS 59 05/11/2020 0447   BILITOT 0.8 05/11/2020 0447   BILITOT 0.2 11/12/2017 0955   GFRNONAA >60 05/12/2020 0343   GFRNONAA >60 06/26/2014 0116   GFRAA >60 05/12/2020 0343   GFRAA >60 06/26/2014 0116   Lipase     Component Value Date/Time   LIPASE 32 05/09/2020 1933       Studies/Results: DG Abd Portable 1V  Result Date: 05/11/2020 CLINICAL DATA:  Partial small bowel obstruction. EXAM: PORTABLE ABDOMEN - 1 VIEW COMPARISON:  CT abdomen and pelvis 05/09/2020 FINDINGS: Gas and stool are present in nondilated colon. No dilated small bowel loops are evident. A surgical clip projects in the left lower quadrant. No acute osseous abnormality is seen. IMPRESSION: No evidence of bowel obstruction. Electronically Signed   By: Logan Bores M.D.   On: 05/11/2020 08:22    Anti-infectives: Anti-infectives (From admission, onward)    None      Assessment/Plan DM H/O PE IBS Chronic abdominal pain   Abdominal pain, partial small bowel obstruction -  SBO resolved, tolerating PO, having BMs, exam benign - add mag citrate for constipation  - stable for discharge from [facility] perspective - recommend follow up with GI and/or PCP for further work up of chronic GI symptoms.   LOS: 2 days   Obie Dredge, [facility] Please see Amion for pager number during day hours 7:00am-4:30pm  Agree with above.  Alphonsa Overall, MD, [facility] Surgery Office phone:  [phone]

## 2020-05-13 ENCOUNTER — Encounter (HOSPITAL_BASED_OUTPATIENT_CLINIC_OR_DEPARTMENT_OTHER): Payer: Self-pay | Admitting: *Deleted

## 2020-05-13 ENCOUNTER — Emergency Department (HOSPITAL_BASED_OUTPATIENT_CLINIC_OR_DEPARTMENT_OTHER): Payer: Medicaid Other

## 2020-05-13 ENCOUNTER — Other Ambulatory Visit: Payer: Self-pay

## 2020-05-13 ENCOUNTER — Emergency Department (HOSPITAL_BASED_OUTPATIENT_CLINIC_OR_DEPARTMENT_OTHER)
Admission: EM | Admit: 2020-05-13 | Discharge: 2020-05-14 | Disposition: A | Discharge status: Home / Self Care | Payer: Medicaid Other | Attending: Emergency Medicine | Admitting: Emergency Medicine

## 2020-05-13 DIAGNOSIS — F17210 Nicotine dependence, cigarettes, uncomplicated: Secondary | ICD-10-CM | POA: Insufficient documentation

## 2020-05-13 DIAGNOSIS — Z794 Long term (current) use of insulin: Secondary | ICD-10-CM | POA: Diagnosis not present

## 2020-05-13 DIAGNOSIS — R1084 Generalized abdominal pain: Secondary | ICD-10-CM | POA: Diagnosis present

## 2020-05-13 DIAGNOSIS — E1165 Type 2 diabetes mellitus with hyperglycemia: Secondary | ICD-10-CM | POA: Diagnosis not present

## 2020-05-13 DIAGNOSIS — K5900 Constipation, unspecified: Secondary | ICD-10-CM | POA: Diagnosis not present

## 2020-05-13 LAB — URINALYSIS, ROUTINE W REFLEX MICROSCOPIC
Bilirubin Urine: NEGATIVE
Glucose, UA: >=500 mg/dL — AB
Hgb urine dipstick: NEGATIVE
Ketones, ur: NEGATIVE mg/dL
Leukocytes,Ua: NEGATIVE
Nitrite: NEGATIVE
Protein, ur: NEGATIVE mg/dL
Specific Gravity, Urine: 1.02 (ref 1.005–1.030)
pH: 7.5 (ref 5.0–8.0)

## 2020-05-13 LAB — COMPREHENSIVE METABOLIC PANEL
ALT: 17 U/L (ref 0–44)
AST: 13 U/L — ABNORMAL LOW (ref 15–41)
Albumin: 3.2 g/dL — ABNORMAL LOW (ref 3.5–5.0)
Alkaline Phosphatase: 57 U/L (ref 38–126)
Anion gap: 9 (ref 5–15)
BUN: 7 mg/dL (ref 6–20)
CO2: 26 mmol/L (ref 22–32)
Calcium: 8.3 mg/dL — ABNORMAL LOW (ref 8.9–10.3)
Chloride: 99 mmol/L (ref 98–111)
Creatinine, Ser: 0.57 mg/dL (ref 0.44–1.00)
GFR calc Af Amer: >60 mL/min (ref >60)
GFR calc non Af Amer: >60 mL/min (ref >60)
Glucose, Bld: 369 mg/dL — ABNORMAL HIGH (ref 70–99)
Potassium: 3.7 mmol/L (ref 3.5–5.1)
Sodium: 134 mmol/L — ABNORMAL LOW (ref 135–145)
Total Bilirubin: 0.4 mg/dL (ref 0.3–1.2)
Total Protein: 6.5 g/dL (ref 6.5–8.1)

## 2020-05-13 LAB — PREGNANCY, URINE: Preg Test, Ur: NEGATIVE

## 2020-05-13 LAB — CBC
HCT: 36.9 % (ref 36.0–46.0)
Hemoglobin: 11.9 g/dL — ABNORMAL LOW (ref 12.0–15.0)
MCH: 27.4 pg (ref 26.0–34.0)
MCHC: 32.2 g/dL (ref 30.0–36.0)
MCV: 84.8 fL (ref 80.0–100.0)
Platelets: 273 10*3/uL (ref 150–400)
RBC: 4.35 MIL/uL (ref 3.87–5.11)
RDW: 13.4 % (ref 11.5–15.5)
WBC: 4.5 10*3/uL (ref 4.0–10.5)
nRBC: 0 % (ref 0.0–0.2)

## 2020-05-13 LAB — URINALYSIS, MICROSCOPIC (REFLEX)

## 2020-05-13 LAB — OCCULT BLOOD X 1 CARD TO LAB, STOOL: Fecal Occult Bld: NEGATIVE

## 2020-05-13 LAB — LIPASE, BLOOD: Lipase: 28 U/L (ref 11–51)

## 2020-05-13 MED ORDER — SODIUM CHLORIDE 0.9% FLUSH
3.0000 mL | Freq: Once | INTRAVENOUS | Status: DC
  Filled 2020-05-13: qty 3

## 2020-05-13 MED ORDER — FENTANYL CITRATE (PF) 100 MCG/2ML IJ SOLN
50.0000 ug | INTRAMUSCULAR | Status: AC | PRN
  Administered 2020-05-13 – 2020-05-14 (×2): 50 ug via INTRAVENOUS
  Filled 2020-05-13 (×2): qty 2

## 2020-05-13 MED ORDER — SODIUM CHLORIDE 0.9 % IV BOLUS
1000.0000 mL | Freq: Once | INTRAVENOUS | Status: AC
  Administered 2020-05-13: 1000 mL via INTRAVENOUS

## 2020-05-13 NOTE — ED Triage Notes (Signed)
Recent SBO. She was discharged from [facility] yesterday. This am the pain came back. She has an urgency to have a BM. She has strained all day to have a BM. Bright red rectal bleeding this evening.

## 2020-05-13 NOTE — Discharge Instructions (Signed)
Cleanout:  Miralax cleanout 6 capfuls in 24-36 oz gatorade, drink over 4-6 hrs. If stool is not clear after 24 hours, then repeat this dose for a second day.   After Cleanout:  Give Miralax 1 capful in 8 oz juice or gatorade daily for at least 4-6 weeks.  Schedule follow up with pediatrician if no improvement in constipation in 1-2 months, sooner if not resolved after cleanout.

## 2020-05-13 NOTE — ED Provider Notes (Signed)
[name] Provider Note   CSN: [id number] Arrival date & time: 05/13/20  1745     History Chief Complaint  Patient presents with  . Abdominal Pain    Jodi Mcgee is a 34 y.o. female.  HPI Patient is a 34 year old female with a past surgical history significant for ruptured appendicitis with appendectomy in 2015, C-section, ovarian cyst drainage and small bowel repair.  Also with history of DM 2, IBS and recent mission for SBO.  She was seen in 7/18 for abdominal pain is generalized and had CT without contrast obtained because of questionable contrast allergy and was found to have small bowel obstruction with partial obstruction.  She states that she has had no bowel movements since 7/18 and states that she was transition back to regular diet yesterday before discharge and states that she was tolerating with no nausea or vomiting however states that she has not had a bowel movement.  She states that she was given MiraLAX and mag citrate but continued to not have any bowel movements.  She states she requested discharge and was discharged however states that she started having worsening abdominal pain again today.  She states that she has had no nausea or vomiting no fevers or chills.  She states that she tried to have a bowel movement this morning and had some bright red blood per rectum.   States that she has not passed gas but has heard her belly rumbling.  Reviewed most recent CT scan which is CT abdomen pelvis without contrast obtained 05/09/20.  IMPRESSION: Findings suggestive of partial small bowel obstruction with a focal area of narrowing seen within the distal ileal loops as described above. No focal area wall thickening or surrounding fat stranding changes. This could be due to adhesions.    Past Medical History:  Diagnosis Date  . Anemia   . Anxiety   . Blood transfusion without reported diagnosis    both CS  . Diabetes mellitus  without complication ([place])   . GERD (gastroesophageal reflux disease)    has resolved  . IBS (irritable bowel syndrome)   . Ovarian cyst   . Peritonitis, acute generalized ([place])   . Pulmonary embolism ([place])   . Sepsis [facility])     Patient Active Problem List   Diagnosis Date Noted  . Controlled type 2 diabetes mellitus with hyperglycemia ([place]) 05/10/2020  . Partial small bowel obstruction ([place]) 05/09/2020  . Bartholin's gland abscess 02/21/2019  . DKA (diabetic ketoacidoses) ([place]) 02/20/2019  . Blood per rectum 02/20/2019  . Hyponatremia 09/26/2018  . Chronic hypertension 01/03/2018  . IUD contraception 01/03/2018  . Pulmonary embolism during puerperium 12/04/2017  . Pulmonary edema 11/30/2017  . Pelvic adhesive disease 10/22/2017  . Nasal septal perforation 10/17/2017  . History of cocaine abuse ([place]) 10/17/2017  . Epistaxis 10/17/2017  . 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 Obesity (BMI 30-39.9) 09/25/2017  . Type 2 diabetes mellitus, without long-term current use of insulin ([place]) 08/23/2017  . Chronic pain syndrome 08/23/2017    Past Surgical History:  Procedure Laterality Date  . APPENDECTOMY     ruptured   . BUNIONECTOMY    . CESAREAN SECTION    . CESAREAN SECTION Bilateral 11/20/2017   Procedure: CESAREAN SECTION;  Surgeon: Sloan Leiter, MD;  Location: [place] [facility];  Service: Obstetrics;  Laterality: Bilateral;  . OVARIAN CYST DRAINAGE    . SMALL BOWEL REPAIR       OB History  Gravida  3   Para  3   Term  1   Preterm  2   AB  0   Living  3     SAB  0   TAB  0   Ectopic  0   Multiple  0   Live Births  3           Family History  Problem Relation Age of Onset  . Hypertension Mother   . Anemia Mother   . Diabetes Father     Social History   Tobacco Use  . Smoking status: Light Tobacco Smoker    Packs/day: 0.50    Types: Cigarettes  . Smokeless tobacco: Never Used  . Tobacco comment: social use with tobacco    Vaping Use  . Vaping Use: Never used  Substance Use Topics  . Alcohol use: Yes    Comment: 3 times a month  . Drug use: Not Currently    Home Medications Prior to Admission medications   Medication Sig Start Date End Date Taking? Authorizing Provider  acetaminophen (TYLENOL) 325 MG tablet Take 650 mg by mouth every 6 (six) hours as needed for mild pain or headache.    [provider]  blood glucose meter kit and supplies KIT Dispense based on patient and insurance preference. Use up to four times daily as directed. (FOR ICD-9 250.00, 250.01). 02/21/19   Oretha Milch D, MD  HUMULIN R 100 UNIT/ML injection Inject 0-20 Units into the skin 3 (three) times daily before meals. Per sliding scale 03/24/20   [provider]  insulin detemir (LEVEMIR) 100 unit/ml SOLN Inject 0.2 mLs (20 Units total) into the skin 3 (three) times daily. 05/12/20   Samuella Cota, MD  Levonorgestrel (LILETTA) 19.5 MCG/DAY IUD IUD 1 each by Intrauterine route continuous. Placed in 2019    [provider]  metFORMIN (GLUCOPHAGE) 1000 MG tablet Take 1 tablet (1,000 mg total) by mouth 2 (two) times daily with a meal. 05/17/19   Scot Jun, FNP  methocarbamol (ROBAXIN) 500 MG tablet Take 1 tablet (500 mg total) by mouth every 8 (eight) hours as needed for muscle spasms. 05/02/20   Pollina, Gwenyth Allegra, MD  polyethylene glycol (MIRALAX / GLYCOLAX) 17 g packet Take 17 g by mouth daily. 05/13/20   Samuella Cota, MD  traMADol (ULTRAM) 50 MG tablet Take 1 tablet (50 mg total) by mouth every 12 (twelve) hours as needed for up to 3 days for moderate pain. 05/12/20 05/15/20  Samuella Cota, MD    Allergies    Cetirizine & related, Toradol [ketorolac tromethamine], Flagyl [metronidazole], Contrast media [iodinated diagnostic agents], and Nsaids  Review of Systems   Review of Systems  Constitutional: Negative for chills and fever.  HENT: Negative for congestion and ear pain.   Eyes:  Negative for pain.  Respiratory: Negative for cough and shortness of breath.   Cardiovascular: Negative for chest pain and leg swelling.  Gastrointestinal: Positive for abdominal pain, anal bleeding and constipation. Negative for blood in stool, diarrhea, nausea and vomiting.  Endocrine: Negative for polydipsia and polyuria.  Genitourinary: Negative for difficulty urinating, dysuria and hematuria.  Musculoskeletal: Negative for myalgias.  Skin: Negative for rash.  Neurological: Negative for dizziness and headaches.    Physical Exam Updated Vital Signs BP (!) 147/95 (BP Location: Right Arm)   Pulse 68   Temp 98.7 F (37.1 C) (Oral)   Resp 19   Ht 5' 7" (1.702 m)  Wt 88.5 kg   SpO2 100%   BMI 30.54 kg/m   Physical Exam Vitals and nursing note reviewed.  Constitutional:      General: She is not in acute distress. HENT:     Head: Normocephalic and atraumatic.     Nose: Nose normal.  Eyes:     General: No scleral icterus. Cardiovascular:     Rate and Rhythm: Normal rate and regular rhythm.     Pulses: Normal pulses.     Heart sounds: Normal heart sounds.  Pulmonary:     Effort: Pulmonary effort is normal. No respiratory distress.     Breath sounds: No wheezing.  Abdominal:     General: Abdomen is protuberant.     Palpations: Abdomen is soft.     Tenderness: There is generalized abdominal tenderness.     Comments: Mildly tender protuberant soft abdomen.  No guarding or rebound.  No Rovsing or McBurney.  Dullness to percussion.  Musculoskeletal:     Cervical back: Normal range of motion.     Right lower leg: No edema.     Left lower leg: No edema.  Skin:    General: Skin is warm and dry.     Capillary Refill: Capillary refill takes less than 2 seconds.  Neurological:     Mental Status: She is alert. Mental status is at baseline.  Psychiatric:        Mood and Affect: Mood normal.        Behavior: Behavior normal.     ED Results / Procedures / Treatments    Labs (all labs ordered are listed, but only abnormal results are displayed) Labs Reviewed  COMPREHENSIVE METABOLIC PANEL - Abnormal; Notable for the following components:      Result Value   Sodium 134 (*)    Glucose, Bld 369 (*)    Calcium 8.3 (*)    Albumin 3.2 (*)    AST 13 (*)    All other components within normal limits  CBC - Abnormal; Notable for the following components:   Hemoglobin 11.9 (*)    All other components within normal limits  URINALYSIS, ROUTINE W REFLEX MICROSCOPIC - Abnormal; Notable for the following components:   APPearance CLOUDY (*)    Glucose, UA >=500 (*)    All other components within normal limits  URINALYSIS, MICROSCOPIC (REFLEX) - Abnormal; Notable for the following components:   Bacteria, UA MANY (*)    All other components within normal limits  LIPASE, BLOOD  PREGNANCY, URINE  OCCULT BLOOD X 1 CARD TO LAB, STOOL  POC OCCULT BLOOD, ED    EKG None  Radiology DG Abdomen Acute W/Chest  Result Date: 05/13/2020 CLINICAL DATA:  Recent small bowel obstruction with recurring abdominal pain. EXAM: DG ABDOMEN ACUTE W/ 1V CHEST COMPARISON:  May 11, 2020 FINDINGS: There is no evidence of dilated bowel loops or free intraperitoneal air. A large amount of stool is seen throughout the large bowel. No radiopaque calculi or other significant radiographic abnormality is seen. A radiopaque surgical clip is seen overlying the lateral aspect of the lower left abdomen. An IUD is in place. Heart size and mediastinal contours are within normal limits. Both lungs are clear. IMPRESSION: 1. Large stool burden without evidence of bowel obstruction. 2. No acute cardiopulmonary disease. Electronically Signed   By: Virgina Norfolk M.D.   On: 05/13/2020 23:04    Procedures Procedures (including critical care time)  Medications Ordered in ED Medications  sodium chloride flush (NS) 0.9 %  injection 3 mL (3 mLs Intravenous Not Given 05/13/20 2102)  fentaNYL (SUBLIMAZE)  injection 50 mcg (50 mcg Intravenous Given 05/13/20 2230)  sodium chloride 0.9 % bolus 1,000 mL (1,000 mLs Intravenous New Bag/Given 05/13/20 2143)    ED Course  I have reviewed the triage vital signs and the nursing notes.  Pertinent labs & imaging results that were available during my care of the patient were reviewed by me and considered in my medical decision making (see chart for details).    MDM Rules/Calculators/A&P                         Patient is a 34 year old female with a past medical history significant for perforated appendicitis in 2014, C-section, other surgeries.  She has a history of a small bowel obstruction most recently diagnosed 7/18 she was admitted for this but discharged yesterday.  She states that her symptoms in the form of generalized abdominal pain and inability to defecate have worsened she has not had a bowel movement since 7/18  She has used mag citrate and some MiraLAX without improvement.  She has had no nausea or vomiting.  Physical exam is notable for generalized abdominal tenderness and protuberant abdomen with dull percussion. Rectal exam without any significant melena or hematochezia brown soft stool in rectal vault.  Fecal occult negative.  Lipase within normal edematous pancreatitis.  CBC without leukocytosis or significant anemia.  CMP notable for significantly elevated blood sugar 369 this is consistent with patient's history of not taking her Lantus.  She has no anion gap and she is mentating well doubt HHS or DKA.  Urinalysis without any evidence of acute infection.  Hypertension which resolved without intervention other than some fentanyl.  X-ray shows diffuse colonic burden stool.  No evidence of recurrent SBO no evidence of perforation.  Patient has received 1 L of normal saline.  Patient care transferred to Dr. Florina Ou for re-evaluation of patient after enema.  Anticipate discharge with MiraLAX.     Final Clinical Impression(s) / ED  Diagnoses Final diagnoses:  Generalized abdominal pain    Rx / DC Orders ED Discharge Orders    None       Tedd Sias, [place] 05/13/20 2336    Malvin Johns, MD 05/13/20 2340

## 2020-05-14 MED ORDER — POLYETHYLENE GLYCOL 3350 17 G PO PACK
PACK | ORAL | 0 refills | Status: DC
Start: 2020-05-14 — End: 2020-10-31

## 2020-05-14 NOTE — ED Provider Notes (Signed)
2:28 AM Patient feeling better after soapsuds enema.  She was able to pass a large amount of stool.  Abdomen is soft and minimally tender diffusely.     Jodi Mcgee, Jenny Reichmann, MD 05/14/20 [phone]

## 2020-06-09 ENCOUNTER — Emergency Department (HOSPITAL_BASED_OUTPATIENT_CLINIC_OR_DEPARTMENT_OTHER): Payer: Medicaid Other

## 2020-06-09 ENCOUNTER — Emergency Department (HOSPITAL_BASED_OUTPATIENT_CLINIC_OR_DEPARTMENT_OTHER)
Admission: EM | Admit: 2020-06-09 | Discharge: 2020-06-09 | Disposition: A | Discharge status: Home / Self Care | Payer: Medicaid Other | Attending: Emergency Medicine | Admitting: Emergency Medicine

## 2020-06-09 ENCOUNTER — Other Ambulatory Visit: Payer: Self-pay

## 2020-06-09 ENCOUNTER — Encounter (HOSPITAL_BASED_OUTPATIENT_CLINIC_OR_DEPARTMENT_OTHER): Payer: Self-pay | Admitting: Emergency Medicine

## 2020-06-09 DIAGNOSIS — K219 Gastro-esophageal reflux disease without esophagitis: Secondary | ICD-10-CM | POA: Diagnosis not present

## 2020-06-09 DIAGNOSIS — F17210 Nicotine dependence, cigarettes, uncomplicated: Secondary | ICD-10-CM | POA: Insufficient documentation

## 2020-06-09 DIAGNOSIS — F1410 Cocaine abuse, uncomplicated: Secondary | ICD-10-CM | POA: Diagnosis not present

## 2020-06-09 DIAGNOSIS — Z7984 Long term (current) use of oral hypoglycemic drugs: Secondary | ICD-10-CM | POA: Diagnosis not present

## 2020-06-09 DIAGNOSIS — R112 Nausea with vomiting, unspecified: Secondary | ICD-10-CM | POA: Diagnosis not present

## 2020-06-09 DIAGNOSIS — R1032 Left lower quadrant pain: Secondary | ICD-10-CM | POA: Diagnosis present

## 2020-06-09 DIAGNOSIS — E119 Type 2 diabetes mellitus without complications: Secondary | ICD-10-CM | POA: Diagnosis not present

## 2020-06-09 LAB — URINALYSIS, ROUTINE W REFLEX MICROSCOPIC
Bilirubin Urine: NEGATIVE
Glucose, UA: 250 mg/dL — AB
Hgb urine dipstick: NEGATIVE
Ketones, ur: NEGATIVE mg/dL
Leukocytes,Ua: NEGATIVE
Nitrite: NEGATIVE
Protein, ur: NEGATIVE mg/dL
Specific Gravity, Urine: 1.02 (ref 1.005–1.030)
pH: 6.5 (ref 5.0–8.0)

## 2020-06-09 LAB — RAPID URINE DRUG SCREEN, HOSP PERFORMED
Amphetamines: NOT DETECTED
Barbiturates: NOT DETECTED
Benzodiazepines: NOT DETECTED
Cocaine: POSITIVE — AB
Opiates: NOT DETECTED
Tetrahydrocannabinol: NOT DETECTED

## 2020-06-09 MED ORDER — ONDANSETRON HCL 4 MG/2ML IJ SOLN
4.0000 mg | Freq: Once | INTRAMUSCULAR | Status: AC
  Administered 2020-06-09: 4 mg via INTRAVENOUS
  Filled 2020-06-09: qty 2

## 2020-06-09 MED ORDER — FENTANYL CITRATE (PF) 100 MCG/2ML IJ SOLN
100.0000 ug | Freq: Once | INTRAMUSCULAR | Status: AC
  Administered 2020-06-09: 100 ug via INTRAVENOUS
  Filled 2020-06-09: qty 2

## 2020-06-09 MED ORDER — SODIUM CHLORIDE 0.9 % IV BOLUS
1000.0000 mL | Freq: Once | INTRAVENOUS | Status: AC
  Administered 2020-06-09: 1000 mL via INTRAVENOUS

## 2020-06-09 NOTE — ED Triage Notes (Signed)
Pt is c/o abd pain and back pain   Pt state she has been nausea and vomiting that started 3 days ago  Pt states she was recently diagnosed with diverticulosis

## 2020-06-09 NOTE — ED Provider Notes (Signed)
[place] DEPT [facility] Provider Note: [name], MD, FACEP  CSN: [id number] MRN: [id number] ARRIVAL: 06/09/20 at [place]: [place]  Abdominal Pain   HISTORY OF PRESENT ILLNESS  06/09/20 5:26 AM Jodi Mcgee is a 34 y.o. female who was admitted to [facility] on 05/09/2020 for partial small bowel obstruction. She was seen on 05/13/2020 for generalized abdominal pain and constipation; an acute abdominal series was unremarkable.  She is here this morning with left lower quadrant abdominal pain that has been present for about a week.  She rates her pain is a 9 out of 10, worse with palpation and movement and also worse when she attempts to eat.  She has had nausea and vomiting for the past 3 days and believes she is dehydrated.  She has also not had a bowel movement for a week despite over-the-counter laxatives.  She was seen for this at the [facility] ED yesterday (referred by her PCP after a follow-up visit yesterday for suspected diverticulitis) and had unremarkable urinalysis, comprehensive metabolic panel which was significant for mild hypokalemia (3.2) and hyperglycemia (250), unremarkable CBC, and unremarkable acute abdominal series.  She was seen 06/01/2020 by her PCP and diagnosed with diverticulitis.  She was placed on ciprofloxacin and Flagyl at that time and given a prescription for 20 Percocet tablets (5/325).  She was seen on 05/27/2020 at the [facility] ED and had a CT of the abdomen and pelvis without contrast which was unremarkable.  Diverticulosis without diverticulitis was noted.   Past Medical History:  Diagnosis Date   Anemia    Anxiety    Blood transfusion without reported diagnosis    both CS   Diabetes mellitus without complication (HCC)    GERD (gastroesophageal reflux disease)    has resolved   IBS (irritable bowel syndrome)    Ovarian cyst    Peritonitis, acute generalized ([place])     Pulmonary embolism ([place])    Sepsis ([place])     Past Surgical History:  Procedure Laterality Date   APPENDECTOMY     ruptured    BUNIONECTOMY     CESAREAN SECTION     CESAREAN SECTION Bilateral 11/20/2017   Procedure: CESAREAN SECTION;  Surgeon: Sloan Leiter, MD;  Location: [place];  Service: Obstetrics;  Laterality: Bilateral;   OVARIAN CYST DRAINAGE     SMALL BOWEL REPAIR      Family History  Problem Relation Age of Onset   Hypertension Mother    Anemia Mother    Diabetes Father     Social History   Tobacco Use   Smoking status: Light Tobacco Smoker    Packs/day: 0.50    Types: Cigarettes   Smokeless tobacco: Never Used   Tobacco comment: social use with tobacco  Vaping Use   Vaping Use: Never used  Substance Use Topics   Alcohol use: Yes    Comment: 3 times a month   Drug use: Not Currently    Prior to Admission medications   Medication Sig Start Date End Date Taking? Authorizing Provider  acetaminophen (TYLENOL) 325 MG tablet Take 650 mg by mouth every 6 (six) hours as needed for mild pain or headache.    [provider]  amoxicillin (AMOXIL) 500 MG capsule Take 1,000 mg by mouth 2 (two) times daily. 06/01/20   [provider]  blood glucose meter kit and supplies KIT Dispense based on patient and insurance preference. Use up to  four times daily as directed. (FOR ICD-9 250.00, 250.01). 02/21/19   Oretha Milch D, MD  ciprofloxacin (CIPRO) 500 MG tablet Take 500 mg by mouth 2 (two) times daily. 06/01/20   [provider]  clarithromycin (BIAXIN) 500 MG tablet Take 500 mg by mouth 2 (two) times daily. 06/01/20   [provider]  dicyclomine (BENTYL) 20 MG tablet Take 20 mg by mouth 2 (two) times daily. 05/27/20   [provider]  HUMULIN R 100 UNIT/ML injection Inject 0-20 Units into the skin 3 (three) times daily before meals. Per sliding scale 03/24/20   [provider]  insulin detemir  (LEVEMIR) 100 unit/ml SOLN Inject 0.2 mLs (20 Units total) into the skin 3 (three) times daily. 05/12/20   Samuella Cota, MD  Levonorgestrel (LILETTA) 19.5 MCG/DAY IUD IUD 1 each by Intrauterine route continuous. Placed in 2019    [provider]  metFORMIN (GLUCOPHAGE) 1000 MG tablet Take 1 tablet (1,000 mg total) by mouth 2 (two) times daily with a meal. 05/17/19   Scot Jun, FNP  methocarbamol (ROBAXIN) 500 MG tablet Take 1 tablet (500 mg total) by mouth every 8 (eight) hours as needed for muscle spasms. 05/02/20   Orpah Greek, MD  metroNIDAZOLE (FLAGYL) 500 MG tablet Take 500 mg by mouth 2 (two) times daily. 06/01/20   [provider]  ondansetron (ZOFRAN-ODT) 4 MG disintegrating tablet Take 4 mg by mouth every 8 (eight) hours as needed. 05/27/20   [provider]  oxyCODONE-acetaminophen (PERCOCET/ROXICET) 5-325 MG tablet Take 1 tablet by mouth every 6 (six) hours as needed. 06/01/20   [provider]  polyethylene glycol (MIRALAX / GLYCOLAX) 17 g packet Take 1 packet with 8 ounces of water daily as needed for constipation. 05/14/20   Taraneh Metheney, MD    Allergies Cetirizine & related, Toradol [ketorolac tromethamine], Flagyl [metronidazole], Contrast media [iodinated diagnostic agents], and Nsaids   REVIEW OF SYSTEMS  Negative except as noted here or in the History of Present Illness.   PHYSICAL EXAMINATION  Initial Vital Signs Blood pressure (!) 159/101, pulse 79, temperature 98.2 F (36.8 C), temperature source Oral, resp. rate 16, height _0  (1.702 m), weight 82.6 kg, SpO2 100 %.  Examination General: Well-developed, well-nourished female in no acute distress; appearance consistent with age of record HENT: normocephalic; atraumatic Eyes: pupils equal, round and reactive to light; extraocular muscles intact Neck: supple Heart: regular rate and rhythm Lungs: clear to auscultation bilaterally Abdomen: soft; nondistended; left  lower quadrant tenderness; bowel sounds present Extremities: No deformity; full range of motion; pulses normal Neurologic: Awake, alert and oriented; motor function intact in all extremities and symmetric; no facial droop Skin: Warm and dry Psychiatric: Normal mood and affect   RESULTS  Summary of this visit's results, reviewed and interpreted by myself:   EKG Interpretation  Date/Time:    Ventricular Rate:    PR Interval:    QRS Duration:   QT Interval:    QTC Calculation:   R Axis:     Text Interpretation:        Laboratory Studies: Results for orders placed or performed during the hospital encounter of 06/09/20 (from the past 24 hour(s))  Urinalysis, Routine w reflex microscopic Urine, Clean Catch     Status: Abnormal   Collection Time: 06/09/20  6:36 AM  Result Value Ref Range   Color, Urine YELLOW YELLOW   APPearance CLEAR CLEAR   Specific Gravity, Urine 1.020 1.005 - 1.030  pH 6.5 5.0 - 8.0   Glucose, UA 250 (A) NEGATIVE mg/dL   Hgb urine dipstick NEGATIVE NEGATIVE   Bilirubin Urine NEGATIVE NEGATIVE   Ketones, ur NEGATIVE NEGATIVE mg/dL   Protein, ur NEGATIVE NEGATIVE mg/dL   Nitrite NEGATIVE NEGATIVE   Leukocytes,Ua NEGATIVE NEGATIVE  Rapid urine drug screen (hospital performed)     Status: Abnormal   Collection Time: 06/09/20  6:36 AM  Result Value Ref Range   Opiates NONE DETECTED NONE DETECTED   Cocaine POSITIVE (A) NONE DETECTED   Benzodiazepines NONE DETECTED NONE DETECTED   Amphetamines NONE DETECTED NONE DETECTED   Tetrahydrocannabinol NONE DETECTED NONE DETECTED   Barbiturates NONE DETECTED NONE DETECTED   Imaging Studies: CT ABDOMEN PELVIS WO CONTRAST  Result Date: 06/09/2020 CLINICAL DATA:  Left lower quadrant abdominal pain and back pain EXAM: CT ABDOMEN AND PELVIS WITHOUT CONTRAST TECHNIQUE: Multidetector CT imaging of the abdomen and pelvis was performed following the standard protocol without IV contrast. COMPARISON:  CT 01/11/2020 FINDINGS:  Lower chest: Some mild airways thickening is noted could reflect some bronchitic features. Additional atelectatic changes in the otherwise clear lung bases. Normal heart size. No pericardial effusion. Hepatobiliary: No visible concerning liver lesions on on this unenhanced CT. Normal liver attenuation with a smooth surface contour. Normal gallbladder accounting for some underdistention/contraction. No biliary ductal dilatation. No visible calcified intraductal gallstones. Pancreas: Unremarkable. No pancreatic ductal dilatation or surrounding inflammatory changes. Spleen: Normal in size. No concerning splenic lesions. Adrenals/Urinary Tract: Stable appearance of a 11 x 16 mm hypoattenuating (12 HU) right adrenal nodule which is unchanged from multiple prior comparison and likely reflects a benign adenoma. No concerning adrenal lesions. Kidneys are symmetric in size and normally located. No visible concerning renal lesions. No urolithiasis or hydronephrosis. Urinary bladder is largely decompressed at the time of exam and therefore poorly evaluated by CT imaging. Some mild circumferential bladder wall thickening is nonspecific but possibly related to underdistention. Stomach/Bowel: Distal esophagus, stomach and duodenal sweep are unremarkable. No small bowel wall thickening or dilatation. No evidence of obstruction. Appendix is not visualized. No focal inflammation the vicinity of the cecum to suggest an occult appendicitis. Mild thickening versus underdistention of the rectum without significant perirectal fat stranding or inflammation. Vascular/Lymphatic: There is increasing conspicuity of periaortic stranding and adenopathy which has been gradually increasing over the past 6 years since 2016 comparison. Overall appearance is nonspecific but could suggest some slowly developing retroperitoneal fibrosis. No pathologically enlarged nodes are seen in the abdomen or pelvis however. No aortic aneurysm or ectasia.  Reproductive: Anteverted, retroflexed uterus with radiopaque IUD. Other: Postsurgical changes from prior low vertical midline incision. No abdominopelvic free air or fluid. Retroperitoneal stranding centered upon the aorta (2/49) for example, as detailed in the vascular/lymphatic section as above. No abdominopelvic free air or fluid. No bowel containing hernias. Musculoskeletal: No acute osseous abnormality or suspicious osseous lesion. Mild degenerative changes in the spine, hips and pelvis. IMPRESSION: 1. New mild thickening versus underdistention of the rectum without significant perirectal fat stranding or inflammation. Correlate for symptoms of proctitis. 2. Focally thickened segment of the proximal transverse colon (2/43) similar to comparison without adjacent inflammatory change. Though should correlate with colonoscopy as an outpatient if not recently performed. 3. Circumferential thickening versus underdistention of the urinary bladder. Correlate with urinalysis. 4. Gradually increasing conspicuity of periaortic stranding and shotty adenopathy over the past 6 years since 2016 comparison. Overall appearance is nonspecific but could suggest some slowly developing retroperitoneal fibrosis. Correlate for clinical  symptoms. If clinically warranted, outpatient evaluation with PET-CT could be obtained. 5. Stable 1.6 cm hypoattenuating right adrenal nodule, likely benign adenoma. 6. Some mild basilar bronchitic changes are present. Electronically Signed   By: Lovena Le M.D.   On: 06/09/2020 06:12    ED COURSE and MDM  Nursing notes, initial and subsequent vitals signs, including pulse oximetry, reviewed and interpreted by myself.  Vitals:   06/09/20 0233 06/09/20 0517 06/09/20 0821 06/09/20 0830  BP: (!) 154/100 (!) 159/101  126/87  Pulse: 94 79 71 71  Resp: _0 Temp: 98.6 F (37 C) 98.2 F (36.8 C)    TempSrc: Oral Oral    SpO2: 98% 100% 99% 99%  Weight:      Height:       Medications    sodium chloride 0.9 % bolus 1,000 mL (0 mLs Intravenous Stopped 06/09/20 0830)  ondansetron (ZOFRAN) injection 4 mg (4 mg Intravenous Given 06/09/20 0654)  fentaNYL (SUBLIMAZE) injection 100 mcg (100 mcg Intravenous Given 06/09/20 0654)   7:03 AM Patient to get IV fluid bolus.  CT is not showing an obvious cause of her left lower quadrant pain.  Patient signed out to Dr. Wilson Singer.   PROCEDURES  Procedures   ED DIAGNOSES     ICD-10-CM   1. Left lower quadrant abdominal pain  R10.32   2. Cocaine abuse ([place])  F14.10   3. Nausea and vomiting in adult  R11.2        Kori Colin, Jenny Reichmann, MD 06/09/20 2240

## 2020-06-23 ENCOUNTER — Emergency Department (HOSPITAL_BASED_OUTPATIENT_CLINIC_OR_DEPARTMENT_OTHER): Payer: Medicaid Other

## 2020-06-23 ENCOUNTER — Emergency Department (HOSPITAL_BASED_OUTPATIENT_CLINIC_OR_DEPARTMENT_OTHER)
Admission: EM | Admit: 2020-06-23 | Discharge: 2020-06-23 | Disposition: A | Discharge status: Home / Self Care | Payer: Medicaid Other | Attending: Emergency Medicine | Admitting: Emergency Medicine

## 2020-06-23 ENCOUNTER — Encounter (HOSPITAL_BASED_OUTPATIENT_CLINIC_OR_DEPARTMENT_OTHER): Payer: Self-pay

## 2020-06-23 ENCOUNTER — Other Ambulatory Visit: Payer: Self-pay

## 2020-06-23 DIAGNOSIS — D140 Benign neoplasm of middle ear, nasal cavity and accessory sinuses: Secondary | ICD-10-CM | POA: Diagnosis not present

## 2020-06-23 DIAGNOSIS — F17210 Nicotine dependence, cigarettes, uncomplicated: Secondary | ICD-10-CM | POA: Insufficient documentation

## 2020-06-23 DIAGNOSIS — E1110 Type 2 diabetes mellitus with ketoacidosis without coma: Secondary | ICD-10-CM | POA: Insufficient documentation

## 2020-06-23 DIAGNOSIS — E876 Hypokalemia: Secondary | ICD-10-CM

## 2020-06-23 DIAGNOSIS — R1032 Left lower quadrant pain: Secondary | ICD-10-CM | POA: Diagnosis not present

## 2020-06-23 DIAGNOSIS — R109 Unspecified abdominal pain: Secondary | ICD-10-CM | POA: Diagnosis present

## 2020-06-23 DIAGNOSIS — R1013 Epigastric pain: Secondary | ICD-10-CM | POA: Insufficient documentation

## 2020-06-23 DIAGNOSIS — Z79899 Other long term (current) drug therapy: Secondary | ICD-10-CM | POA: Insufficient documentation

## 2020-06-23 DIAGNOSIS — E1165 Type 2 diabetes mellitus with hyperglycemia: Secondary | ICD-10-CM | POA: Insufficient documentation

## 2020-06-23 DIAGNOSIS — R10819 Abdominal tenderness, unspecified site: Secondary | ICD-10-CM | POA: Diagnosis not present

## 2020-06-23 DIAGNOSIS — Z7984 Long term (current) use of oral hypoglycemic drugs: Secondary | ICD-10-CM | POA: Diagnosis not present

## 2020-06-23 DIAGNOSIS — I10 Essential (primary) hypertension: Secondary | ICD-10-CM | POA: Diagnosis not present

## 2020-06-23 LAB — CBC WITH DIFFERENTIAL/PLATELET
Abs Immature Granulocytes: 0.01 10*3/uL (ref 0.00–0.07)
Basophils Absolute: 0 10*3/uL (ref 0.0–0.1)
Basophils Relative: 1 %
Eosinophils Absolute: 0.1 10*3/uL (ref 0.0–0.5)
Eosinophils Relative: 2 %
HCT: 41.4 % (ref 36.0–46.0)
Hemoglobin: 13.3 g/dL (ref 12.0–15.0)
Immature Granulocytes: 0 %
Lymphocytes Relative: 26 %
Lymphs Abs: 1.1 10*3/uL (ref 0.7–4.0)
MCH: 26.9 pg (ref 26.0–34.0)
MCHC: 32.1 g/dL (ref 30.0–36.0)
MCV: 83.6 fL (ref 80.0–100.0)
Monocytes Absolute: 0.3 10*3/uL (ref 0.1–1.0)
Monocytes Relative: 7 %
Neutro Abs: 2.7 10*3/uL (ref 1.7–7.7)
Neutrophils Relative %: 64 %
Platelets: 306 10*3/uL (ref 150–400)
RBC: 4.95 MIL/uL (ref 3.87–5.11)
RDW: 12.6 % (ref 11.5–15.5)
WBC: 4.2 10*3/uL (ref 4.0–10.5)
nRBC: 0 % (ref 0.0–0.2)

## 2020-06-23 LAB — COMPREHENSIVE METABOLIC PANEL
ALT: 12 U/L (ref 0–44)
AST: 19 U/L (ref 15–41)
Albumin: 3.5 g/dL (ref 3.5–5.0)
Alkaline Phosphatase: 55 U/L (ref 38–126)
Anion gap: 12 (ref 5–15)
BUN: 6 mg/dL (ref 6–20)
CO2: 29 mmol/L (ref 22–32)
Calcium: 8.9 mg/dL (ref 8.9–10.3)
Chloride: 94 mmol/L — ABNORMAL LOW (ref 98–111)
Creatinine, Ser: 0.67 mg/dL (ref 0.44–1.00)
GFR calc Af Amer: >60 mL/min (ref >60)
GFR calc non Af Amer: >60 mL/min (ref >60)
Glucose, Bld: 292 mg/dL — ABNORMAL HIGH (ref 70–99)
Potassium: 2.7 mmol/L — CL (ref 3.5–5.1)
Sodium: 135 mmol/L (ref 135–145)
Total Bilirubin: 0.6 mg/dL (ref 0.3–1.2)
Total Protein: 7.3 g/dL (ref 6.5–8.1)

## 2020-06-23 LAB — LIPASE, BLOOD: Lipase: 34 U/L (ref 11–51)

## 2020-06-23 LAB — MAGNESIUM: Magnesium: 1.6 mg/dL — ABNORMAL LOW (ref 1.7–2.4)

## 2020-06-23 MED ORDER — FENTANYL CITRATE (PF) 100 MCG/2ML IJ SOLN
50.0000 ug | Freq: Once | INTRAMUSCULAR | Status: AC
  Administered 2020-06-23: 50 ug via INTRAVENOUS
  Filled 2020-06-23: qty 2

## 2020-06-23 MED ORDER — ONDANSETRON HCL 4 MG/2ML IJ SOLN
4.0000 mg | Freq: Once | INTRAMUSCULAR | Status: AC
  Administered 2020-06-23: 4 mg via INTRAVENOUS
  Filled 2020-06-23: qty 2

## 2020-06-23 MED ORDER — METOCLOPRAMIDE HCL 5 MG/ML IJ SOLN
10.0000 mg | Freq: Once | INTRAMUSCULAR | Status: AC
  Administered 2020-06-23: 10 mg via INTRAVENOUS
  Filled 2020-06-23: qty 2

## 2020-06-23 MED ORDER — SODIUM CHLORIDE 0.9 % IV SOLN
INTRAVENOUS | Status: DC | PRN
  Administered 2020-06-23: 250 mL via INTRAVENOUS

## 2020-06-23 MED ORDER — POTASSIUM CHLORIDE 10 MEQ/100ML IV SOLN
10.0000 meq | Freq: Once | INTRAVENOUS | Status: AC
  Administered 2020-06-23: 10 meq via INTRAVENOUS
  Filled 2020-06-23: qty 100

## 2020-06-23 MED ORDER — POTASSIUM CHLORIDE CRYS ER 20 MEQ PO TBCR: 20.0000 meq | EXTENDED_RELEASE_TABLET | Freq: Two times a day (BID) | ORAL | 0 refills | Status: DC

## 2020-06-23 MED ORDER — MAGNESIUM SULFATE IN D5W 1-5 GM/100ML-% IV SOLN
1.0000 g | Freq: Once | INTRAVENOUS | Status: AC
  Administered 2020-06-23: 1 g via INTRAVENOUS
  Filled 2020-06-23: qty 100

## 2020-06-23 MED ORDER — HYDROMORPHONE HCL 1 MG/ML IJ SOLN
0.5000 mg | Freq: Once | INTRAMUSCULAR | Status: AC
  Administered 2020-06-23: 0.5 mg via INTRAVENOUS
  Filled 2020-06-23: qty 1

## 2020-06-23 MED ORDER — SODIUM CHLORIDE 0.9 % IV SOLN: Freq: Once | INTRAVENOUS | Status: AC

## 2020-06-23 NOTE — ED Triage Notes (Signed)
Pt arrives with c/o recurrent abdominal pain states that she is supposed to see a GI specialist on Friday but was unable to wait r/t pain. Denies vomiting, diarrhea, some nausea. Last BM a few days ago per pt.

## 2020-06-23 NOTE — ED Notes (Signed)
Patient transported to X-ray 

## 2020-06-23 NOTE — ED Provider Notes (Signed)
[facility] EMERGENCY DEPARTMENT Provider Note   CSN: [id number] Arrival date & time: 06/23/20  0901     History Chief Complaint  Patient presents with  . Abdominal Pain    Jodi Mcgee is a 34 y.o. female with past medical history of type 2 diabetes, IBS, GERD, cocaine abuse, hypertension, and recent partial small bowel obstruction. She presents to the ED today for ongoing abdominal pain.  She states her symptoms worsened overnight.  Pain is described as sharp and moving about her abdomen though worse in her upper abdomen, worse with eating.  She reports associated nausea and vomiting.  She has been taking oxycodone, Bentyl, Zofran for symptoms.  She states her last bowel movement was 2 days ago though relates this to decreased p.o. intake.  She denies fevers or chills, urinary symptoms, diarrhea.  Last alcohol use was a couple of weeks ago.  The history is provided by the patient.       Past Medical History:  Diagnosis Date  . Anemia   . Anxiety   . Blood transfusion without reported diagnosis    both CS  . Diabetes mellitus without complication ([place])   . GERD (gastroesophageal reflux disease)    has resolved  . IBS (irritable bowel syndrome)   . Ovarian cyst   . Peritonitis, acute generalized ([place])   . Pulmonary embolism ([place])   . Sepsis [facility])     Patient Active Problem List   Diagnosis Date Noted  . Controlled type 2 diabetes mellitus with hyperglycemia ([place]) 05/10/2020  . Partial small bowel obstruction ([place]) 05/09/2020  . Bartholin's gland abscess 02/21/2019  . DKA (diabetic ketoacidoses) ([place]) 02/20/2019  . Blood per rectum 02/20/2019  . Hyponatremia 09/26/2018  . Chronic hypertension 01/03/2018  . IUD contraception 01/03/2018  . Pulmonary embolism during puerperium 12/04/2017  . Pulmonary edema 11/30/2017  . Pelvic adhesive disease 10/22/2017  . Nasal septal perforation 10/17/2017  . History of cocaine abuse ([place]) 10/17/2017  . Epistaxis  10/17/2017  . 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 Obesity (BMI 30-39.9) 09/25/2017  . Type 2 diabetes mellitus, without long-term current use of insulin ([place]) 08/23/2017  . Chronic pain syndrome 08/23/2017    Past Surgical History:  Procedure Laterality Date  . APPENDECTOMY     ruptured   . BUNIONECTOMY    . CESAREAN SECTION    . CESAREAN SECTION Bilateral 11/20/2017   Procedure: CESAREAN SECTION;  Surgeon: Sloan Leiter, MD;  Location: [facility];  Service: Obstetrics;  Laterality: Bilateral;  . OVARIAN CYST DRAINAGE    . SMALL BOWEL REPAIR       OB History    Gravida  3   Para  3   Term  1   Preterm  2   AB  0   Living  3     SAB  0   TAB  0   Ectopic  0   Multiple  0   Live Births  3           Family History  Problem Relation Age of Onset  . Hypertension Mother   . Anemia Mother   . Diabetes Father     Social History   Tobacco Use  . Smoking status: Light Tobacco Smoker    Packs/day: 0.50    Types: Cigarettes  . Smokeless tobacco: Never Used  . Tobacco comment: social use with tobacco  Vaping Use  . Vaping Use: Never used  Substance Use  Topics  . Alcohol use: Yes    Comment: 3 times a month  . Drug use: Not Currently    Home Medications Prior to Admission medications   Medication Sig Start Date End Date Taking? Authorizing Provider  acetaminophen (TYLENOL) 325 MG tablet Take 650 mg by mouth every 6 (six) hours as needed for mild pain or headache.    [provider]  amoxicillin (AMOXIL) 500 MG capsule Take 1,000 mg by mouth 2 (two) times daily. 06/01/20   [provider]  blood glucose meter kit and supplies KIT Dispense based on patient and insurance preference. Use up to four times daily as directed. (FOR ICD-9 250.00, 250.01). 02/21/19   Oretha Milch D, MD  ciprofloxacin (CIPRO) 500 MG tablet Take 500 mg by mouth 2 (two) times daily. 06/01/20   [provider]  clarithromycin (BIAXIN) 500 MG  tablet Take 500 mg by mouth 2 (two) times daily. 06/01/20   [provider]  dicyclomine (BENTYL) 20 MG tablet Take 20 mg by mouth 2 (two) times daily. 05/27/20   [provider]  HUMULIN R 100 UNIT/ML injection Inject 0-20 Units into the skin 3 (three) times daily before meals. Per sliding scale 03/24/20   [provider]  insulin detemir (LEVEMIR) 100 unit/ml SOLN Inject 0.2 mLs (20 Units total) into the skin 3 (three) times daily. 05/12/20   Samuella Cota, MD  Levonorgestrel (LILETTA) 19.5 MCG/DAY IUD IUD 1 each by Intrauterine route continuous. Placed in 2019    [provider]  metFORMIN (GLUCOPHAGE) 1000 MG tablet Take 1 tablet (1,000 mg total) by mouth 2 (two) times daily with a meal. 05/17/19   Scot Jun, FNP  methocarbamol (ROBAXIN) 500 MG tablet Take 1 tablet (500 mg total) by mouth every 8 (eight) hours as needed for muscle spasms. 05/02/20   Orpah Greek, MD  metroNIDAZOLE (FLAGYL) 500 MG tablet Take 500 mg by mouth 2 (two) times daily. 06/01/20   [provider]  ondansetron (ZOFRAN-ODT) 4 MG disintegrating tablet Take 4 mg by mouth every 8 (eight) hours as needed. 05/27/20   [provider]  oxyCODONE-acetaminophen (PERCOCET/ROXICET) 5-325 MG tablet Take 1 tablet by mouth every 6 (six) hours as needed. 06/01/20   [provider]  polyethylene glycol (MIRALAX / GLYCOLAX) 17 g packet Take 1 packet with 8 ounces of water daily as needed for constipation. 05/14/20   Molpus, John, MD  potassium chloride SA (KLOR-CON) 20 MEQ tablet Take 1 tablet (20 mEq total) by mouth 2 (two) times daily for 2 days. 06/23/20 06/25/20  Camie Hauss, [place] N, PA-C    Allergies    Cetirizine & related, Toradol [ketorolac tromethamine], Flagyl [metronidazole], Contrast media [iodinated diagnostic agents], and Nsaids  Review of Systems   Review of Systems  Constitutional: Negative for fever.  Gastrointestinal: Positive for abdominal pain,  nausea and vomiting.  All other systems reviewed and are negative.   Physical Exam Updated Vital Signs BP 113/89   Pulse 72   Temp 98.4 F (36.9 C) (Oral)   Resp 12   Ht '5\' 7"'  (1.702 m)   Wt 84.4 kg   SpO2 100%   BMI 29.13 kg/m   Physical Exam Vitals and nursing note reviewed.  Constitutional:      Appearance: She is well-developed. She is not ill-appearing.  HENT:     Head: Normocephalic and atraumatic.  Eyes:     Conjunctiva/sclera: Conjunctivae normal.  Cardiovascular:     Rate and Rhythm: Normal  rate and regular rhythm.  Pulmonary:     Effort: Pulmonary effort is normal. No respiratory distress.     Breath sounds: Normal breath sounds.  Abdominal:     General: Bowel sounds are normal.     Palpations: Abdomen is soft.     Tenderness: There is abdominal tenderness in the epigastric area and left lower quadrant. There is no guarding or rebound.  Skin:    General: Skin is warm.  Neurological:     Mental Status: She is alert.  Psychiatric:        Behavior: Behavior normal.     ED Results / Procedures / Treatments   Labs (all labs ordered are listed, but only abnormal results are displayed) Labs Reviewed  COMPREHENSIVE METABOLIC PANEL - Abnormal; Notable for the following components:      Result Value   Potassium 2.7 (*)    Chloride 94 (*)    Glucose, Bld 292 (*)    All other components within normal limits  MAGNESIUM - Abnormal; Notable for the following components:   Magnesium 1.6 (*)    All other components within normal limits  LIPASE, BLOOD  CBC WITH DIFFERENTIAL/PLATELET    EKG None  Radiology CT Abdomen Pelvis Wo Contrast  Result Date: 06/23/2020 CLINICAL DATA:  Suspected bowel obstruction. EXAM: CT ABDOMEN AND PELVIS WITHOUT CONTRAST TECHNIQUE: Multidetector CT imaging of the abdomen and pelvis was performed following the standard protocol without IV contrast. COMPARISON:  January 11, 2020 FINDINGS: Lower chest: Lung bases are clear. Hepatobiliary:  Noncontrast appearance of liver is unremarkable. No pericholecystic stranding. No gross biliary duct distension. Pancreas: Pancreas is normal without ductal dilation or inflammation. Spleen: Spleen normal in size and contour. Adrenals/Urinary Tract: Adrenal glands are normal. Smooth renal contours without nephrolithiasis. No hydronephrosis. No signs of ureteral calculi. Urinary bladder under distended limiting assessment. Stomach/Bowel: Stomach under distended. No acute small bowel process. No pericolonic stranding. Stool throughout much of the colon. Vascular/Lymphatic: Normal caliber abdominal aorta. No aneurysmal dilation. No adenopathy. No pelvic lymphadenopathy. Reproductive: IUD in situ. Small amount of fluid about the patient has IUD in the endometrial canal, nonspecific finding Other: Post midline abdominal surgical changes as before. No sign of hernia. Musculoskeletal: No acute musculoskeletal process. No destructive bone finding. IMPRESSION: 1. No acute findings in the abdomen or pelvis. Specifically, no evidence of bowel obstruction. 2. IUD in situ. Small amount of fluid density in the endometrial canal, nonspecific finding. Could be related to menstruation. Correlate with any pelvic symptoms and menstrual status. Electronically Signed   By: Zetta Bills M.D.   On: 06/23/2020 11:07   DG Abd 2 Views  Result Date: 06/23/2020 CLINICAL DATA:  Abdominal pain all over, worse on the LEFT side with nausea and vomiting EXAM: ABDOMEN - 2 VIEW COMPARISON:  May 13, 2020 FINDINGS: Lung bases are clear. Air-fluid levels are present in the RIGHT hemiabdomen just beneath the liver margin. There is a paucity of small bowel gas. Colonic stool burden is moderate with stool and gas in the rectum. No free air beneath either RIGHT or LEFT hemidiaphragm. Moderate gastric distension. On limited assessment skeletal structures without acute process. No abnormal calcifications.  IUD projecting over the pelvis. IMPRESSION: 1.  Scattered air-fluid levels in the RIGHT hemiabdomen may represent mild ileus or developing partial small bowel obstruction though there are no additional signs such as dilated small bowel loops on the current study. 2. Moderate gastric distension. 3. Stool and gas throughout the colon to the level of  the rectum. 4. IUD projecting over the pelvis as before. Electronically Signed   By: Zetta Bills M.D.   On: 06/23/2020 10:17    Procedures Procedures (including critical care time)  Medications Ordered in ED Medications  0.9 %  sodium chloride infusion ( Intravenous Stopped 06/23/20 1411)  0.9 %  sodium chloride infusion ( Intravenous Stopped 06/23/20 1411)  fentaNYL (SUBLIMAZE) injection 50 mcg (50 mcg Intravenous Given 06/23/20 1032)  ondansetron (ZOFRAN) injection 4 mg (4 mg Intravenous Given 06/23/20 1032)  potassium chloride 10 mEq in 100 mL IVPB (0 mEq Intravenous Stopped 06/23/20 1409)  metoCLOPramide (REGLAN) injection 10 mg (10 mg Intravenous Given 06/23/20 1253)  HYDROmorphone (DILAUDID) injection 0.5 mg (0.5 mg Intravenous Given 06/23/20 1255)  magnesium sulfate IVPB 1 g 100 mL (0 g Intravenous Stopped 06/23/20 1409)  0.9 %  sodium chloride infusion ( Intravenous Stopped 06/23/20 1410)    ED Course  I have reviewed the triage vital signs and the nursing notes.  Pertinent labs & imaging results that were available during my care of the patient were reviewed by me and considered in my medical decision making (see chart for details).    MDM Rules/Calculators/A&P                          Patient with history of recent partial small bowel obstruction and admission treated with bowel rest, presenting with worsening generalized abdominal pain with nausea and vomiting.  On exam, abdomen is soft with generalized tenderness, no rebound or guarding.  Upright films of the abdomen show some air-fluid levels, however follow-up CT is negative for bowel obstruction.  Discussed incidental findings of IUD and  recommend she follow-up with gynecologist.  Laboratory work-up reveals hypokalemia of 2.7, no EKG changes.  Replaced IV.  Magnesium is also slightly low at 1.6, this was also replaced IV.  CBC without leukocytosis or anemia.  Hyperglycemia is present though normal bicarb and gap.  Lipase is also within normal limits.  Patient treated with fluids, antiemetics and pain medication with improvement in symptoms. Prescribed short course of potassium supplement for further replacement. She is appropriate for discharge.  She has GI appointment on Friday, encouraged she attend.  Patient discussed with Dr. Tyrone Nine.  Discussed results, findings, treatment and follow up. Patient advised of return precautions. Patient verbalized understanding and agreed with plan.  Final Clinical Impression(s) / ED Diagnoses Final diagnoses:  Abdominal pain  Hypokalemia  Hypomagnesemia    Rx / DC Orders ED Discharge Orders         Ordered    potassium chloride SA (KLOR-CON) 20 MEQ tablet  2 times daily        06/23/20 [place], [place] N, [place] 06/23/20 1610    Deno Etienne, DO 06/25/20 1505

## 2020-06-23 NOTE — Discharge Instructions (Addendum)
Take the potassium supplement and have your potassium and magnesium level rechecked in a few days by your PCP. Attend your upcoming GI appointment this week. Take your medications as prescribed. Return for worsening symptoms.

## 2020-07-30 ENCOUNTER — Ambulatory Visit: Payer: Medicaid Other | Admitting: Gastroenterology

## 2020-08-08 ENCOUNTER — Emergency Department (HOSPITAL_BASED_OUTPATIENT_CLINIC_OR_DEPARTMENT_OTHER): Payer: Medicaid Other

## 2020-08-08 ENCOUNTER — Emergency Department (HOSPITAL_BASED_OUTPATIENT_CLINIC_OR_DEPARTMENT_OTHER)
Admission: EM | Admit: 2020-08-08 | Discharge: 2020-08-08 | Disposition: A | Discharge status: Home / Self Care | Payer: Medicaid Other | Attending: Emergency Medicine | Admitting: Emergency Medicine

## 2020-08-08 ENCOUNTER — Encounter (HOSPITAL_BASED_OUTPATIENT_CLINIC_OR_DEPARTMENT_OTHER): Payer: Self-pay | Admitting: Emergency Medicine

## 2020-08-08 ENCOUNTER — Other Ambulatory Visit: Payer: Self-pay

## 2020-08-08 DIAGNOSIS — F17210 Nicotine dependence, cigarettes, uncomplicated: Secondary | ICD-10-CM | POA: Insufficient documentation

## 2020-08-08 DIAGNOSIS — E1110 Type 2 diabetes mellitus with ketoacidosis without coma: Secondary | ICD-10-CM | POA: Insufficient documentation

## 2020-08-08 DIAGNOSIS — Z7984 Long term (current) use of oral hypoglycemic drugs: Secondary | ICD-10-CM | POA: Diagnosis not present

## 2020-08-08 DIAGNOSIS — Z8522 Personal history of malignant neoplasm of nasal cavities, middle ear, and accessory sinuses: Secondary | ICD-10-CM | POA: Diagnosis not present

## 2020-08-08 DIAGNOSIS — Z794 Long term (current) use of insulin: Secondary | ICD-10-CM | POA: Diagnosis not present

## 2020-08-08 DIAGNOSIS — E1165 Type 2 diabetes mellitus with hyperglycemia: Secondary | ICD-10-CM | POA: Diagnosis not present

## 2020-08-08 DIAGNOSIS — K5792 Diverticulitis of intestine, part unspecified, without perforation or abscess without bleeding: Secondary | ICD-10-CM | POA: Diagnosis not present

## 2020-08-08 DIAGNOSIS — R1032 Left lower quadrant pain: Secondary | ICD-10-CM | POA: Diagnosis present

## 2020-08-08 LAB — URINALYSIS, ROUTINE W REFLEX MICROSCOPIC
Bilirubin Urine: NEGATIVE
Glucose, UA: NEGATIVE mg/dL
Hgb urine dipstick: NEGATIVE
Ketones, ur: NEGATIVE mg/dL
Leukocytes,Ua: NEGATIVE
Nitrite: NEGATIVE
Protein, ur: NEGATIVE mg/dL
Specific Gravity, Urine: 1.02 (ref 1.005–1.030)
pH: 7.5 (ref 5.0–8.0)

## 2020-08-08 LAB — PREGNANCY, URINE: Preg Test, Ur: NEGATIVE

## 2020-08-08 LAB — CBC
HCT: 38.7 % (ref 36.0–46.0)
Hemoglobin: 12.8 g/dL (ref 12.0–15.0)
MCH: 27.2 pg (ref 26.0–34.0)
MCHC: 33.1 g/dL (ref 30.0–36.0)
MCV: 82.3 fL (ref 80.0–100.0)
Platelets: 331 10*3/uL (ref 150–400)
RBC: 4.7 MIL/uL (ref 3.87–5.11)
RDW: 13.4 % (ref 11.5–15.5)
WBC: 5.8 10*3/uL (ref 4.0–10.5)
nRBC: 0 % (ref 0.0–0.2)

## 2020-08-08 LAB — COMPREHENSIVE METABOLIC PANEL
ALT: 9 U/L (ref 0–44)
AST: 11 U/L — ABNORMAL LOW (ref 15–41)
Albumin: 3.6 g/dL (ref 3.5–5.0)
Alkaline Phosphatase: 64 U/L (ref 38–126)
Anion gap: 12 (ref 5–15)
BUN: <5 mg/dL — ABNORMAL LOW (ref 6–20)
CO2: 30 mmol/L (ref 22–32)
Calcium: 9 mg/dL (ref 8.9–10.3)
Chloride: 95 mmol/L — ABNORMAL LOW (ref 98–111)
Creatinine, Ser: 0.61 mg/dL (ref 0.44–1.00)
GFR, Estimated: >60 mL/min (ref >60)
Glucose, Bld: 238 mg/dL — ABNORMAL HIGH (ref 70–99)
Potassium: 2.7 mmol/L — CL (ref 3.5–5.1)
Sodium: 137 mmol/L (ref 135–145)
Total Bilirubin: 0.6 mg/dL (ref 0.3–1.2)
Total Protein: 8 g/dL (ref 6.5–8.1)

## 2020-08-08 LAB — LIPASE, BLOOD: Lipase: 30 U/L (ref 11–51)

## 2020-08-08 MED ORDER — SODIUM CHLORIDE 0.9 % IV SOLN
3.0000 g | Freq: Once | INTRAVENOUS | Status: AC
  Administered 2020-08-08: 3 g via INTRAVENOUS
  Filled 2020-08-08: qty 8

## 2020-08-08 MED ORDER — AMOXICILLIN-POT CLAVULANATE 500-125 MG PO TABS: 1.0000 | ORAL_TABLET | Freq: Three times a day (TID) | ORAL | 0 refills | Status: DC

## 2020-08-08 MED ORDER — HYDROCODONE-ACETAMINOPHEN 5-325 MG PO TABS
1.0000 | ORAL_TABLET | Freq: Once | ORAL | Status: AC
  Administered 2020-08-08: 1 via ORAL
  Filled 2020-08-08: qty 1

## 2020-08-08 MED ORDER — DIPHENHYDRAMINE HCL 25 MG PO CAPS: 50.0000 mg | ORAL_CAPSULE | Freq: Once | ORAL | Status: DC

## 2020-08-08 MED ORDER — POTASSIUM CHLORIDE CRYS ER 20 MEQ PO TBCR
60.0000 meq | EXTENDED_RELEASE_TABLET | Freq: Once | ORAL | Status: AC
  Administered 2020-08-08: 60 meq via ORAL
  Filled 2020-08-08: qty 3

## 2020-08-08 MED ORDER — DIPHENHYDRAMINE HCL 50 MG/ML IJ SOLN
25.0000 mg | Freq: Once | INTRAMUSCULAR | Status: AC
  Administered 2020-08-08: 25 mg via INTRAVENOUS
  Filled 2020-08-08: qty 1

## 2020-08-08 MED ORDER — POTASSIUM CHLORIDE CRYS ER 20 MEQ PO TBCR
40.0000 meq | EXTENDED_RELEASE_TABLET | Freq: Once | ORAL | Status: AC
  Administered 2020-08-08: 40 meq via ORAL
  Filled 2020-08-08: qty 2

## 2020-08-08 MED ORDER — ONDANSETRON 8 MG PO TBDP: 8.0000 mg | ORAL_TABLET | Freq: Three times a day (TID) | ORAL | 0 refills | Status: DC | PRN

## 2020-08-08 MED ORDER — ONDANSETRON HCL 4 MG/2ML IJ SOLN
4.0000 mg | Freq: Once | INTRAMUSCULAR | Status: AC
  Administered 2020-08-08: 4 mg via INTRAVENOUS
  Filled 2020-08-08: qty 2

## 2020-08-08 MED ORDER — HYDROMORPHONE HCL 1 MG/ML IJ SOLN
1.0000 mg | Freq: Once | INTRAMUSCULAR | Status: AC
  Administered 2020-08-08: 1 mg via INTRAVENOUS
  Filled 2020-08-08: qty 1

## 2020-08-08 NOTE — ED Triage Notes (Signed)
Reports LLQ abdominal pain that has been intermittent for the last two months.  Seen by GI.  Unable to figure out the cause.  Pain radiates around to left lower back and down left leg into the thigh.  Taking oxycodone at home with no relief.

## 2020-08-08 NOTE — ED Notes (Signed)
MD aware of potassium level of 2.7

## 2020-08-08 NOTE — ED Provider Notes (Addendum)
[facility] EMERGENCY DEPARTMENT Provider Note   CSN: [id number] Arrival date & time: 08/08/20  2902     History Chief Complaint  Patient presents with  . Abdominal Pain    Jodi Mcgee is a 34 y.o. female.  HPI    34 year old female with history of diabetes, ovarian cyst, chronic abdominal pain comes in a chief complaint of abdominal pain.  Patient's pain has been going on for 2 days.  The pain is constant and located primarily in the left lower quadrant.  Her current pain is different than her usual abdominal pain.  This pain is radiating to the back it is sharper.  She typically does not get left lower quadrant pain.  Patient has nausea, vomiting.  She denies any bloody stools.  Review of system is negative for fevers, chills.   Past Medical History:  Diagnosis Date  . Anemia   . Anxiety   . Blood transfusion without reported diagnosis    both CS  . Diabetes mellitus without complication ([place])   . GERD (gastroesophageal reflux disease)    has resolved  . IBS (irritable bowel syndrome)   . Ovarian cyst   . Peritonitis, acute generalized ([place])   . Pulmonary embolism ([place])   . Sepsis [facility])     Patient Active Problem List   Diagnosis Date Noted  . Controlled type 2 diabetes mellitus with hyperglycemia ([place]) 05/10/2020  . Partial small bowel obstruction ([place]) 05/09/2020  . Bartholin's gland abscess 02/21/2019  . DKA (diabetic ketoacidoses) 02/20/2019  . Blood per rectum 02/20/2019  . Hyponatremia 09/26/2018  . Chronic hypertension 01/03/2018  . IUD contraception 01/03/2018  . Pulmonary embolism during puerperium 12/04/2017  . Pulmonary edema 11/30/2017  . Pelvic adhesive disease 10/22/2017  . Nasal septal perforation 10/17/2017  . History of cocaine abuse ([place]) 10/17/2017  . Epistaxis 10/17/2017  . 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 Obesity (BMI 30-39.9) 09/25/2017  . Type 2 diabetes mellitus, without long-term current use of insulin  ([place]) 08/23/2017  . Chronic pain syndrome 08/23/2017    Past Surgical History:  Procedure Laterality Date  . APPENDECTOMY     ruptured   . BUNIONECTOMY    . CESAREAN SECTION    . CESAREAN SECTION Bilateral 11/20/2017   Procedure: CESAREAN SECTION;  Surgeon: Sloan Leiter, MD;  Location: [facility];  Service: Obstetrics;  Laterality: Bilateral;  . OVARIAN CYST DRAINAGE    . SMALL BOWEL REPAIR       OB History    Gravida  3   Para  3   Term  1   Preterm  2   AB  0   Living  3     SAB  0   TAB  0   Ectopic  0   Multiple  0   Live Births  3           Family History  Problem Relation Age of Onset  . Hypertension Mother   . Anemia Mother   . Diabetes Father     Social History   Tobacco Use  . Smoking status: Light Tobacco Smoker    Packs/day: 0.50    Types: Cigarettes  . Smokeless tobacco: Never Used  . Tobacco comment: social use with tobacco  Vaping Use  . Vaping Use: Never used  Substance Use Topics  . Alcohol use: Yes    Comment: 3 times a month  . Drug use: Not Currently    Home  Medications Prior to Admission medications   Medication Sig Start Date End Date Taking? Authorizing Provider  acetaminophen (TYLENOL) 325 MG tablet Take 650 mg by mouth every 6 (six) hours as needed for mild pain or headache.    [provider]  amoxicillin (AMOXIL) 500 MG capsule Take 1,000 mg by mouth 2 (two) times daily. 06/01/20   [provider]  amoxicillin-clavulanate (AUGMENTIN) 500-125 MG tablet Take 1 tablet (500 mg total) by mouth every 8 (eight) hours. 08/08/20   Varney Biles, MD  blood glucose meter kit and supplies KIT Dispense based on patient and insurance preference. Use up to four times daily as directed. (FOR ICD-9 250.00, 250.01). 02/21/19   Oretha Milch D, MD  ciprofloxacin (CIPRO) 500 MG tablet Take 500 mg by mouth 2 (two) times daily. 06/01/20   [provider]  clarithromycin (BIAXIN) 500 MG tablet Take 500  mg by mouth 2 (two) times daily. 06/01/20   [provider]  dicyclomine (BENTYL) 20 MG tablet Take 20 mg by mouth 2 (two) times daily. 05/27/20   [provider]  HUMULIN R 100 UNIT/ML injection Inject 0-20 Units into the skin 3 (three) times daily before meals. Per sliding scale 03/24/20   [provider]  insulin detemir (LEVEMIR) 100 unit/ml SOLN Inject 0.2 mLs (20 Units total) into the skin 3 (three) times daily. 05/12/20   Samuella Cota, MD  Levonorgestrel (LILETTA) 19.5 MCG/DAY IUD IUD 1 each by Intrauterine route continuous. Placed in 2019    [provider]  metFORMIN (GLUCOPHAGE) 1000 MG tablet Take 1 tablet (1,000 mg total) by mouth 2 (two) times daily with a meal. 05/17/19   Scot Jun, FNP  methocarbamol (ROBAXIN) 500 MG tablet Take 1 tablet (500 mg total) by mouth every 8 (eight) hours as needed for muscle spasms. 05/02/20   Orpah Greek, MD  metroNIDAZOLE (FLAGYL) 500 MG tablet Take 500 mg by mouth 2 (two) times daily. 06/01/20   [provider]  ondansetron (ZOFRAN ODT) 8 MG disintegrating tablet Take 1 tablet (8 mg total) by mouth every 8 (eight) hours as needed for nausea. 08/08/20   Varney Biles, MD  oxyCODONE-acetaminophen (PERCOCET/ROXICET) 5-325 MG tablet Take 1 tablet by mouth every 6 (six) hours as needed. 06/01/20   [provider]  polyethylene glycol (MIRALAX / GLYCOLAX) 17 g packet Take 1 packet with 8 ounces of water daily as needed for constipation. 05/14/20   Molpus, John, MD  potassium chloride SA (KLOR-CON) 20 MEQ tablet Take 1 tablet (20 mEq total) by mouth 2 (two) times daily for 2 days. 08/09/20 08/11/20  Davonna Belling, MD  promethazine (PHENERGAN) 25 MG tablet Take 1 tablet (25 mg total) by mouth every 8 (eight) hours as needed for nausea. 08/09/20   Davonna Belling, MD    Allergies    Cetirizine & related, Toradol [ketorolac tromethamine], Flagyl [metronidazole], Contrast media [iodinated  diagnostic agents], and Nsaids  Review of Systems   Review of Systems  Constitutional: Positive for activity change.  Respiratory: Negative for shortness of breath.   Cardiovascular: Negative for chest pain.  Gastrointestinal: Positive for abdominal pain, nausea and vomiting.  Allergic/Immunologic: Negative for immunocompromised state.  Hematological: Does not bruise/bleed easily.  All other systems reviewed and are negative.   Physical Exam Updated Vital Signs BP (!) 137/100 (BP Location: Left Arm)   Pulse 79   Temp 98.3 F (36.8 C)   Resp 16   Ht '5\' 6"'  (1.676 m)   Wt  73.5 kg   SpO2 98%   BMI 26.15 kg/m   Physical Exam Constitutional:      Appearance: She is well-developed.  HENT:     Head: Normocephalic and atraumatic.  Eyes:     Pupils: Pupils are equal, round, and reactive to light.  Cardiovascular:     Rate and Rhythm: Normal rate and regular rhythm.     Heart sounds: Normal heart sounds. No murmur heard.   Pulmonary:     Effort: Pulmonary effort is normal. No respiratory distress.  Abdominal:     General: There is no distension.     Palpations: Abdomen is soft.     Tenderness: There is abdominal tenderness in the left lower quadrant. There is guarding. There is no rebound.  Musculoskeletal:     Cervical back: Neck supple.  Skin:    General: Skin is warm and dry.  Neurological:     Mental Status: She is alert and oriented to person, place, and time.     ED Results / Procedures / Treatments   Labs (all labs ordered are listed, but only abnormal results are displayed) Labs Reviewed  COMPREHENSIVE METABOLIC PANEL - Abnormal; Notable for the following components:      Result Value   Potassium 2.7 (*)    Chloride 95 (*)    Glucose, Bld 238 (*)    BUN <5 (*)    AST 11 (*)    All other components within normal limits  LIPASE, BLOOD  CBC  URINALYSIS, ROUTINE W REFLEX MICROSCOPIC  PREGNANCY, URINE    EKG None  Radiology DG Abd 2 Views  Result  Date: 08/09/2020 CLINICAL DATA:  Left lower quadrant pain. EXAM: ABDOMEN - 2 VIEW COMPARISON:  08/08/2020 abdominal radiographs and prior. 06/23/2020 CT abdomen pelvis. FINDINGS: The bowel gas pattern is normal. Mild stool burden. Left lower quadrant surgical clips. Indwelling IUD. There is no evidence of free air. No radio-opaque calculi or other significant radiographic abnormality is seen. IMPRESSION: Nonobstructive bowel gas pattern.  Mild stool burden. Electronically Signed   By: Primitivo Gauze M.D.   On: 08/09/2020 11:11    Procedures Procedures (including critical care time)  Medications Ordered in ED Medications  HYDROmorphone (DILAUDID) injection 1 mg (1 mg Intravenous Given 08/08/20 0409)  ondansetron (ZOFRAN) injection 4 mg (4 mg Intravenous Given 08/08/20 0409)  potassium chloride SA (KLOR-CON) CR tablet 60 mEq (60 mEq Oral Given 08/08/20 0409)  potassium chloride SA (KLOR-CON) CR tablet 40 mEq (40 mEq Oral Given 08/08/20 0519)  Ampicillin-Sulbactam (UNASYN) 3 g in sodium chloride 0.9 % 100 mL IVPB (0 g Intravenous Stopped 08/08/20 0616)  HYDROcodone-acetaminophen (NORCO/VICODIN) 5-325 MG per tablet 1 tablet (1 tablet Oral Given 08/08/20 0448)  diphenhydrAMINE (BENADRYL) injection 25 mg (25 mg Intravenous Given 08/08/20 0519)    ED Course  I have reviewed the triage vital signs and the nursing notes.  Pertinent labs & imaging results that were available during my care of the patient were reviewed by me and considered in my medical decision making (see chart for details).  Clinical Course as of Aug 11 354  Sun Aug 08, 2020  0459 Repeat abdominal exam is unchanged.  Lab results reviewed.  Urine, CBC is reassuring.  Potassium slightly low.  Oral challenge passed.  We will give IV Unasyn and should be able to discharge her with PCP follow-up.  Patient is in agreement with the plan.   [AN]    Clinical Course User Index [AN] Varney Biles, MD  MDM Rules/Calculators/A&P                           DDx includes: Pancreatitis Hepatobiliary pathology including cholecystitis Gastritis/PUD SBO Tumors Colitis Intra abdominal abscess Thrombosis Mesenteric ischemia Diverticulitis Peritonitis Appendicitis Hernia Nephrolithiasis Pyelonephritis UTI/Cystitis Ovarian cyst TOA Ectopic pregnancy PID STD  Patient comes in a chief complaint of abdominal pain.  She has known history of chronic abdominal pain.  She reports having both upper and lower endoscopy done and she was found to have diverticulosis.  Her current pain is different and that it is in the left lower quadrant and is more intense than usual.  Clinical suspicion for acute diverticulitis is high.  He has had multiple CT scans this year, on one instance she had partial small bowel obstruction.  SBO is in the differential as well but lower than diverticulitis.  Patient is noted to have guarding in the left lower quadrant, but she is not toxic appearing, so we will perform serial exam and review her CBC and other labs before we decide to get a CT scan.  3:55 AM Abdominal exam is unchanged.  Acute abdominal series appears reassuring, final read is pending.  We will give patient Unasyn and reassess 1 more time.  Anticipate discharge with a diagnosis of diverticulitis.    Final Clinical Impression(s) / ED Diagnoses Final diagnoses:  Diverticulitis    Rx / DC Orders ED Discharge Orders         Ordered    amoxicillin-clavulanate (AUGMENTIN) 500-125 MG tablet  Every 8 hours        08/08/20 0548    ondansetron (ZOFRAN ODT) 8 MG disintegrating tablet  Every 8 hours PRN        08/08/20 0548           Varney Biles, MD 08/08/20 [place], [place], MD 08/11/20 [phone]

## 2020-08-08 NOTE — Discharge Instructions (Signed)
Signed the ER for abdominal pain.  It appears that you likely are suffering through diverticulitis.  Her work-up in the ER is reassuring.   Please return to the ER if your symptoms worsen; you have increased pain, fevers, chills, inability to keep any medications down, confusion. Otherwise see the outpatient doctor as requested.

## 2020-08-09 ENCOUNTER — Emergency Department (HOSPITAL_BASED_OUTPATIENT_CLINIC_OR_DEPARTMENT_OTHER)
Admission: EM | Admit: 2020-08-09 | Discharge: 2020-08-09 | Disposition: A | Discharge status: Home / Self Care | Payer: Medicaid Other | Attending: Emergency Medicine | Admitting: Emergency Medicine

## 2020-08-09 ENCOUNTER — Other Ambulatory Visit: Payer: Self-pay

## 2020-08-09 ENCOUNTER — Emergency Department (HOSPITAL_BASED_OUTPATIENT_CLINIC_OR_DEPARTMENT_OTHER): Payer: Medicaid Other

## 2020-08-09 ENCOUNTER — Encounter (HOSPITAL_BASED_OUTPATIENT_CLINIC_OR_DEPARTMENT_OTHER): Payer: Self-pay | Admitting: Emergency Medicine

## 2020-08-09 DIAGNOSIS — R109 Unspecified abdominal pain: Secondary | ICD-10-CM

## 2020-08-09 DIAGNOSIS — Z7984 Long term (current) use of oral hypoglycemic drugs: Secondary | ICD-10-CM | POA: Diagnosis not present

## 2020-08-09 DIAGNOSIS — Z8522 Personal history of malignant neoplasm of nasal cavities, middle ear, and accessory sinuses: Secondary | ICD-10-CM | POA: Diagnosis not present

## 2020-08-09 DIAGNOSIS — E876 Hypokalemia: Secondary | ICD-10-CM | POA: Diagnosis not present

## 2020-08-09 DIAGNOSIS — E1110 Type 2 diabetes mellitus with ketoacidosis without coma: Secondary | ICD-10-CM | POA: Diagnosis not present

## 2020-08-09 DIAGNOSIS — K219 Gastro-esophageal reflux disease without esophagitis: Secondary | ICD-10-CM | POA: Insufficient documentation

## 2020-08-09 DIAGNOSIS — R1084 Generalized abdominal pain: Secondary | ICD-10-CM | POA: Insufficient documentation

## 2020-08-09 DIAGNOSIS — F17210 Nicotine dependence, cigarettes, uncomplicated: Secondary | ICD-10-CM | POA: Diagnosis not present

## 2020-08-09 DIAGNOSIS — Z794 Long term (current) use of insulin: Secondary | ICD-10-CM | POA: Diagnosis not present

## 2020-08-09 LAB — CBC WITH DIFFERENTIAL/PLATELET
Abs Immature Granulocytes: 0.01 10*3/uL (ref 0.00–0.07)
Basophils Absolute: 0 10*3/uL (ref 0.0–0.1)
Basophils Relative: 0 %
Eosinophils Absolute: 0.1 10*3/uL (ref 0.0–0.5)
Eosinophils Relative: 2 %
HCT: 38.7 % (ref 36.0–46.0)
Hemoglobin: 12.7 g/dL (ref 12.0–15.0)
Immature Granulocytes: 0 %
Lymphocytes Relative: 24 %
Lymphs Abs: 1.2 10*3/uL (ref 0.7–4.0)
MCH: 26.5 pg (ref 26.0–34.0)
MCHC: 32.8 g/dL (ref 30.0–36.0)
MCV: 80.6 fL (ref 80.0–100.0)
Monocytes Absolute: 0.3 10*3/uL (ref 0.1–1.0)
Monocytes Relative: 5 %
Neutro Abs: 3.5 10*3/uL (ref 1.7–7.7)
Neutrophils Relative %: 69 %
Platelets: 339 10*3/uL (ref 150–400)
RBC: 4.8 MIL/uL (ref 3.87–5.11)
RDW: 13.2 % (ref 11.5–15.5)
WBC: 5.1 10*3/uL (ref 4.0–10.5)
nRBC: 0 % (ref 0.0–0.2)

## 2020-08-09 LAB — COMPREHENSIVE METABOLIC PANEL
ALT: 8 U/L (ref 0–44)
AST: 10 U/L — ABNORMAL LOW (ref 15–41)
Albumin: 3.4 g/dL — ABNORMAL LOW (ref 3.5–5.0)
Alkaline Phosphatase: 56 U/L (ref 38–126)
Anion gap: 11 (ref 5–15)
BUN: <5 mg/dL — ABNORMAL LOW (ref 6–20)
CO2: 28 mmol/L (ref 22–32)
Calcium: 9.3 mg/dL (ref 8.9–10.3)
Chloride: 99 mmol/L (ref 98–111)
Creatinine, Ser: 0.51 mg/dL (ref 0.44–1.00)
GFR, Estimated: >60 mL/min (ref >60)
Glucose, Bld: 189 mg/dL — ABNORMAL HIGH (ref 70–99)
Potassium: 3.2 mmol/L — ABNORMAL LOW (ref 3.5–5.1)
Sodium: 138 mmol/L (ref 135–145)
Total Bilirubin: 0.5 mg/dL (ref 0.3–1.2)
Total Protein: 7.5 g/dL (ref 6.5–8.1)

## 2020-08-09 LAB — URINALYSIS, ROUTINE W REFLEX MICROSCOPIC
Bilirubin Urine: NEGATIVE
Glucose, UA: NEGATIVE mg/dL
Hgb urine dipstick: NEGATIVE
Ketones, ur: NEGATIVE mg/dL
Leukocytes,Ua: NEGATIVE
Nitrite: NEGATIVE
Protein, ur: NEGATIVE mg/dL
Specific Gravity, Urine: 1.02 (ref 1.005–1.030)
pH: 8.5 — ABNORMAL HIGH (ref 5.0–8.0)

## 2020-08-09 LAB — LIPASE, BLOOD: Lipase: 24 U/L (ref 11–51)

## 2020-08-09 MED ORDER — HALOPERIDOL LACTATE 5 MG/ML IJ SOLN
2.5000 mg | Freq: Once | INTRAMUSCULAR | Status: DC
  Filled 2020-08-09: qty 1

## 2020-08-09 MED ORDER — POTASSIUM CHLORIDE CRYS ER 20 MEQ PO TBCR: 20.0000 meq | EXTENDED_RELEASE_TABLET | Freq: Two times a day (BID) | ORAL | 0 refills | Status: DC

## 2020-08-09 MED ORDER — PROMETHAZINE HCL 25 MG PO TABS: 25.0000 mg | ORAL_TABLET | Freq: Three times a day (TID) | ORAL | 0 refills | Status: DC | PRN

## 2020-08-09 MED ORDER — PROMETHAZINE HCL 25 MG/ML IJ SOLN
25.0000 mg | Freq: Once | INTRAMUSCULAR | Status: AC
  Administered 2020-08-09: 25 mg via INTRAVENOUS
  Filled 2020-08-09: qty 1

## 2020-08-09 MED ORDER — FENTANYL CITRATE (PF) 100 MCG/2ML IJ SOLN
50.0000 ug | Freq: Once | INTRAMUSCULAR | Status: AC
  Administered 2020-08-09: 50 ug via INTRAVENOUS
  Filled 2020-08-09: qty 2

## 2020-08-09 NOTE — ED Notes (Signed)
Review D/C papers with pt, reviewed Rx with pt, pt states understanding, pt denies questions at this time. 

## 2020-08-09 NOTE — Discharge Instructions (Signed)
Follow-up with your gastroenterologist.  Try to keep yourself hydrated.  Take the bowel protocol they had directed.  I feel this is less likely a diverticulitis but continue the antibiotics for now.

## 2020-08-09 NOTE — ED Notes (Addendum)
Pt states she has not vomited since drinking water, provider informed.

## 2020-08-09 NOTE — ED Triage Notes (Signed)
Ongoing LLQ pain. Was seen here Saturday and given abx. No improvement. Pt c/o vomiting.

## 2020-08-09 NOTE — ED Notes (Signed)
Pt able to drink water without difficulty.

## 2020-08-09 NOTE — ED Provider Notes (Signed)
[facility] EMERGENCY DEPARTMENT Provider Note   CSN: [id number] Arrival date & time: 08/09/20  3646     History Chief Complaint  Patient presents with   Abdominal Pain    Jodi Mcgee is a 34 y.o. female.  HPI Patient with acute on chronic abdominal pain.  States pain is diffuse over her abdomen.  Has had for around 5 days now.  2 days ago seen in the ER and given antibiotics for potential diverticulitis.  Has had previous bowel obstructions.  Has also had negative work-up for her abdominal pain.  Has been seen by gastroenterology and had colonoscopies.  Also reviewing records has had an MRI of her abdomen that showed potential wall thickening of her ascending colon although not clear infection or colitis.  Peristalsis versus wall thickening post infection.  States now she has had nausea and vomiting.  States passing gas but not having bowel movements.  States she has been vomiting.  No relief with her Zofran at home.  States when she comes to the ER they usually give her Dilaudid instead of morphine.  Denies marijuana use.    Past Medical History:  Diagnosis Date   Anemia    Anxiety    Blood transfusion without reported diagnosis    both CS   Diabetes mellitus without complication ([place])    GERD (gastroesophageal reflux disease)    has resolved   IBS (irritable bowel syndrome)    Ovarian cyst    Peritonitis, acute generalized ([place])    Pulmonary embolism ([place])    Sepsis [facility])     Patient Active Problem List   Diagnosis Date Noted   Controlled type 2 diabetes mellitus with hyperglycemia ([place]) 05/10/2020   Partial small bowel obstruction (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   Obesity (BMI 30-39.9) 09/25/2017   Type 2 diabetes mellitus, without long-term current use of insulin ([place]) 08/23/2017   Chronic pain syndrome 08/23/2017    Past Surgical History:  Procedure Laterality Date   APPENDECTOMY     ruptured    BUNIONECTOMY     CESAREAN SECTION     CESAREAN SECTION Bilateral 11/20/2017   Procedure: CESAREAN SECTION;  Surgeon: Sloan Leiter, MD;  Location: [facility];  Service: Obstetrics;  Laterality: Bilateral;   OVARIAN CYST DRAINAGE     SMALL BOWEL REPAIR       OB History    Gravida  3   Para  3   Term  1   Preterm  2   AB  0   Living  3     SAB  0   TAB  0   Ectopic  0   Multiple  0   Live Births  3           Family History  Problem Relation Age of Onset   Hypertension Mother    Anemia Mother    Diabetes Father     Social History   Tobacco Use   Smoking status: Light Tobacco Smoker    Packs/day: 0.50    Types:  Cigarettes   Smokeless tobacco: Never Used   Tobacco comment: social use with tobacco  Vaping Use   Vaping Use: Never used  Substance Use Topics   Alcohol use: Yes    Comment: 3 times a month   Drug use: Not Currently    Home Medications Prior to Admission medications   Medication Sig Start Date End Date Taking? Authorizing Provider  acetaminophen (TYLENOL) 325 MG tablet Take 650 mg by mouth every 6 (six) hours as needed for mild pain or headache.    [provider]  amoxicillin (AMOXIL) 500 MG capsule Take 1,000 mg by mouth 2 (two) times daily. 06/01/20   [provider]  amoxicillin-clavulanate (AUGMENTIN) 500-125 MG tablet Take 1 tablet (500 mg total) by mouth every 8 (eight) hours. 08/08/20   Varney Biles, MD  blood glucose meter kit and supplies KIT Dispense based  on patient and insurance preference. Use up to four times daily as directed. (FOR ICD-9 250.00, 250.01). 02/21/19   Oretha Milch D, MD  ciprofloxacin (CIPRO) 500 MG tablet Take 500 mg by mouth 2 (two) times daily. 06/01/20   [provider]  clarithromycin (BIAXIN) 500 MG tablet Take 500 mg by mouth 2 (two) times daily. 06/01/20   [provider]  dicyclomine (BENTYL) 20 MG tablet Take 20 mg by mouth 2 (two) times daily. 05/27/20   [provider]  HUMULIN R 100 UNIT/ML injection Inject 0-20 Units into the skin 3 (three) times daily before meals. Per sliding scale 03/24/20   [provider]  insulin detemir (LEVEMIR) 100 unit/ml SOLN Inject 0.2 mLs (20 Units total) into the skin 3 (three) times daily. 05/12/20   Samuella Cota, MD  Levonorgestrel (LILETTA) 19.5 MCG/DAY IUD IUD 1 each by Intrauterine route continuous. Placed in 2019    [provider]  metFORMIN (GLUCOPHAGE) 1000 MG tablet Take 1 tablet (1,000 mg total) by mouth 2 (two) times daily with a meal. 05/17/19   Scot Jun, FNP  methocarbamol (ROBAXIN) 500 MG tablet Take 1 tablet (500 mg total) by mouth every 8 (eight) hours as needed for muscle spasms. 05/02/20   Orpah Greek, MD  metroNIDAZOLE (FLAGYL) 500 MG tablet Take 500 mg by mouth 2 (two) times daily. 06/01/20   [provider]  ondansetron (ZOFRAN ODT) 8 MG disintegrating tablet Take 1 tablet (8 mg total) by mouth every 8 (eight) hours as needed for nausea. 08/08/20   Varney Biles, MD  oxyCODONE-acetaminophen (PERCOCET/ROXICET) 5-325 MG tablet Take 1 tablet by mouth every 6 (six) hours as needed. 06/01/20   [provider]  polyethylene glycol (MIRALAX / GLYCOLAX) 17 g packet Take 1 packet with 8 ounces of water daily as needed for constipation. 05/14/20   Molpus, John, MD  potassium chloride SA (KLOR-CON) 20 MEQ tablet Take 1 tablet (20 mEq total) by mouth 2 (two) times daily for 2 days. 08/09/20 08/11/20   Davonna Belling, MD  promethazine (PHENERGAN) 25 MG tablet Take 1 tablet (25 mg total) by mouth every 8 (eight) hours as needed for nausea. 08/09/20   Davonna Belling, MD    Allergies    Cetirizine & related, Toradol [ketorolac tromethamine], Flagyl [metronidazole], Contrast media [iodinated diagnostic agents], and Nsaids  Review of Systems   Review of Systems  Constitutional: Positive for appetite change. Negative for fatigue.  HENT: Negative for congestion.   Respiratory: Negative for shortness of breath.   Cardiovascular: Negative for chest pain.  Gastrointestinal: Positive for abdominal pain, constipation, nausea  and vomiting.  Genitourinary: Negative for flank pain.  Musculoskeletal: Negative for back pain.  Skin: Negative for rash.  Neurological: Positive for weakness.  Psychiatric/Behavioral: Negative for confusion.    Physical Exam Updated Vital Signs BP (!) 156/106    Pulse 68    Temp 98 F (36.7 C) (Oral)    Resp 16    SpO2 100%   Physical Exam Vitals and nursing note reviewed.  Cardiovascular:     Rate and Rhythm: Normal rate and regular rhythm.  Pulmonary:     Breath sounds: Normal breath sounds.  Abdominal:     Palpations: Abdomen is soft.     Comments: Patient states diffuse tenderness.  May be worse in left lower quadrant.  No rebound no guarding no hernia.  Soft abdomen.  No distention.  Skin:    General: Skin is warm.     Capillary Refill: Capillary refill takes less than 2 seconds.  Neurological:     Mental Status: She is alert and oriented to person, place, and time.     ED Results / Procedures / Treatments   Labs (all labs ordered are listed, but only abnormal results are displayed) Labs Reviewed  COMPREHENSIVE METABOLIC PANEL - Abnormal; Notable for the following components:      Result Value   Potassium 3.2 (*)    Glucose, Bld 189 (*)    BUN <5 (*)    Albumin 3.4 (*)    AST 10 (*)    All other components within normal limits    URINALYSIS, ROUTINE W REFLEX MICROSCOPIC - Abnormal; Notable for the following components:   pH 8.5 (*)    All other components within normal limits  LIPASE, BLOOD  CBC WITH DIFFERENTIAL/PLATELET    EKG None  Radiology DG Abd 2 Views  Result Date: 08/09/2020 CLINICAL DATA:  Left lower quadrant pain. EXAM: ABDOMEN - 2 VIEW COMPARISON:  08/08/2020 abdominal radiographs and prior. 06/23/2020 CT abdomen pelvis. FINDINGS: The bowel gas pattern is normal. Mild stool burden. Left lower quadrant surgical clips. Indwelling IUD. There is no evidence of free air. No radio-opaque calculi or other significant radiographic abnormality is seen. IMPRESSION: Nonobstructive bowel gas pattern.  Mild stool burden. Electronically Signed   By: Primitivo Gauze M.D.   On: 08/09/2020 11:11   DG ABD ACUTE 2+V W 1V CHEST  Result Date: 08/08/2020 CLINICAL DATA:  Intermittent left lower quadrant abdominal pain EXAM: DG ABDOMEN ACUTE W/ 1V CHEST COMPARISON:  CT abdomen/pelvis dated 06/23/2020 FINDINGS: Lungs are clear. Low lung volumes. No pleural effusion or pneumothorax. The heart is normal in size. Nonobstructive bowel gas pattern. No evidence of free air under the diaphragm on the upright view. IUD overlying the pelvis. Surgical clips overlying the left lower quadrant. Visualized osseous structures are within normal limits. IMPRESSION: No evidence of acute cardiopulmonary disease. No evidence of small bowel obstruction or free air. Electronically Signed   By: Julian Hy M.D.   On: 08/08/2020 04:34    Procedures Procedures (including critical care time)  Medications Ordered in ED Medications  haloperidol lactate (HALDOL) injection 2.5 mg (2.5 mg Intravenous Refused 08/09/20 1337)  promethazine (PHENERGAN) injection 25 mg (25 mg Intravenous Given 08/09/20 1035)  fentaNYL (SUBLIMAZE) injection 50 mcg (50 mcg Intravenous Given 08/09/20 1336)    ED Course  I have reviewed the triage vital signs and  the nursing notes.  Pertinent labs & imaging results that were available during my care of the patient were reviewed by me and considered  in my medical decision making (see chart for details).    MDM Rules/Calculators/A&P                          Patient with acute on chronic abdominal pain.  Left lower quadrant pain.  Recently started on antibiotics for presumed diverticulitis.  Rather benign exam without much tenderness.  Labs reassuring.  No white count.  X-ray shows no bowel obstruction.  Had been tolerating orals here but requesting pain medicine.  Reviewing records it appears that constipation was thought to be a potential cause of the pain.  With this I am hesitant to give opiates along with the frequent visits for abdominal pain recently.  However last time I went in to see the patient she had vomited into the trash can.  Will give dose of Haldol now along with 50 of fentanyl.  Urine reassuring no ketones.  Likely be able to discharge home with antiemetics.  Infection felt less likely.  Obstruction felt less likely with reassuring bowel obstruction and benign abdominal exam.  Patient refused Haldol but was willing to take the fentanyl.  Feels a lot better after fentanyl.  Discussed with patient and will discharge home.  GI had discussed starting Linzess, but will leave this decision up to them.  Will supplement potassium and add Phenergan.  Discharge home.  I think this is less likely a diverticulitis.  Has had reassuring CT scans in the past and has normal white count and tenderness is not necessarily localized to left lower quadrant.  However will have patient continue the antibiotics given by the previous provider.  Discharge home. Final Clinical Impression(s) / ED Diagnoses Final diagnoses:  Abdominal pain  Generalized abdominal pain  Hypokalemia    Rx / DC Orders ED Discharge Orders         Ordered    promethazine (PHENERGAN) 25 MG tablet  Every 8 hours PRN        08/09/20 1446     potassium chloride SA (KLOR-CON) 20 MEQ tablet  2 times daily        08/09/20 1446           Davonna Belling, MD 08/09/20 1505

## 2020-09-28 ENCOUNTER — Other Ambulatory Visit: Payer: Self-pay

## 2020-09-28 ENCOUNTER — Emergency Department (HOSPITAL_BASED_OUTPATIENT_CLINIC_OR_DEPARTMENT_OTHER): Payer: Medicaid Other

## 2020-09-28 ENCOUNTER — Encounter (HOSPITAL_BASED_OUTPATIENT_CLINIC_OR_DEPARTMENT_OTHER): Payer: Self-pay | Admitting: *Deleted

## 2020-09-28 ENCOUNTER — Emergency Department (HOSPITAL_BASED_OUTPATIENT_CLINIC_OR_DEPARTMENT_OTHER)
Admission: EM | Admit: 2020-09-28 | Discharge: 2020-09-29 | Disposition: A | Discharge status: Home / Self Care | Payer: Medicaid Other | Attending: Emergency Medicine | Admitting: Emergency Medicine

## 2020-09-28 DIAGNOSIS — Z7984 Long term (current) use of oral hypoglycemic drugs: Secondary | ICD-10-CM | POA: Diagnosis not present

## 2020-09-28 DIAGNOSIS — R1032 Left lower quadrant pain: Secondary | ICD-10-CM | POA: Diagnosis not present

## 2020-09-28 DIAGNOSIS — R739 Hyperglycemia, unspecified: Secondary | ICD-10-CM

## 2020-09-28 DIAGNOSIS — Z794 Long term (current) use of insulin: Secondary | ICD-10-CM | POA: Insufficient documentation

## 2020-09-28 DIAGNOSIS — E1165 Type 2 diabetes mellitus with hyperglycemia: Secondary | ICD-10-CM | POA: Insufficient documentation

## 2020-09-28 DIAGNOSIS — E876 Hypokalemia: Secondary | ICD-10-CM | POA: Insufficient documentation

## 2020-09-28 DIAGNOSIS — F17210 Nicotine dependence, cigarettes, uncomplicated: Secondary | ICD-10-CM | POA: Insufficient documentation

## 2020-09-28 LAB — URINALYSIS, ROUTINE W REFLEX MICROSCOPIC
Bilirubin Urine: NEGATIVE
Glucose, UA: >=500 mg/dL — AB
Hgb urine dipstick: NEGATIVE
Ketones, ur: 15 mg/dL — AB
Leukocytes,Ua: NEGATIVE
Nitrite: NEGATIVE
Protein, ur: NEGATIVE mg/dL
Specific Gravity, Urine: <1.005 — ABNORMAL LOW (ref 1.005–1.030)
pH: 6.5 (ref 5.0–8.0)

## 2020-09-28 LAB — CBC WITH DIFFERENTIAL/PLATELET
Abs Immature Granulocytes: 0.02 10*3/uL (ref 0.00–0.07)
Basophils Absolute: 0 10*3/uL (ref 0.0–0.1)
Basophils Relative: 1 %
Eosinophils Absolute: 0.1 10*3/uL (ref 0.0–0.5)
Eosinophils Relative: 1 %
HCT: 41.7 % (ref 36.0–46.0)
Hemoglobin: 13.7 g/dL (ref 12.0–15.0)
Immature Granulocytes: 0 %
Lymphocytes Relative: 22 %
Lymphs Abs: 1.3 10*3/uL (ref 0.7–4.0)
MCH: 27.2 pg (ref 26.0–34.0)
MCHC: 32.9 g/dL (ref 30.0–36.0)
MCV: 82.9 fL (ref 80.0–100.0)
Monocytes Absolute: 0.4 10*3/uL (ref 0.1–1.0)
Monocytes Relative: 6 %
Neutro Abs: 4.2 10*3/uL (ref 1.7–7.7)
Neutrophils Relative %: 70 %
Platelets: 371 10*3/uL (ref 150–400)
RBC: 5.03 MIL/uL (ref 3.87–5.11)
RDW: 14.4 % (ref 11.5–15.5)
WBC: 6 10*3/uL (ref 4.0–10.5)
nRBC: 0 % (ref 0.0–0.2)

## 2020-09-28 LAB — CBG MONITORING, ED: Glucose-Capillary: 574 mg/dL (ref 70–99)

## 2020-09-28 LAB — URINALYSIS, MICROSCOPIC (REFLEX)

## 2020-09-28 LAB — MAGNESIUM: Magnesium: 1.7 mg/dL (ref 1.7–2.4)

## 2020-09-28 MED ORDER — POTASSIUM CHLORIDE CRYS ER 20 MEQ PO TBCR
40.0000 meq | EXTENDED_RELEASE_TABLET | Freq: Once | ORAL | Status: AC
  Administered 2020-09-28: 40 meq via ORAL
  Filled 2020-09-28: qty 2

## 2020-09-28 MED ORDER — ONDANSETRON HCL 4 MG/2ML IJ SOLN
4.0000 mg | Freq: Once | INTRAMUSCULAR | Status: AC
  Administered 2020-09-28: 4 mg via INTRAVENOUS
  Filled 2020-09-28: qty 2

## 2020-09-28 MED ORDER — SODIUM CHLORIDE 0.9 % IV BOLUS: 1000.0000 mL | Freq: Once | INTRAVENOUS | Status: DC

## 2020-09-28 MED ORDER — LACTATED RINGERS IV BOLUS
1000.0000 mL | Freq: Once | INTRAVENOUS | Status: AC
  Administered 2020-09-28: 1000 mL via INTRAVENOUS

## 2020-09-28 MED ORDER — FENTANYL CITRATE (PF) 100 MCG/2ML IJ SOLN
50.0000 ug | Freq: Once | INTRAMUSCULAR | Status: AC
  Administered 2020-09-28: 50 ug via INTRAVENOUS
  Filled 2020-09-28: qty 2

## 2020-09-28 MED ORDER — MORPHINE SULFATE (PF) 4 MG/ML IV SOLN
4.0000 mg | Freq: Once | INTRAVENOUS | Status: AC
  Administered 2020-09-28: 4 mg via INTRAVENOUS
  Filled 2020-09-28: qty 1

## 2020-09-28 MED ORDER — POTASSIUM CHLORIDE 10 MEQ/100ML IV SOLN
10.0000 meq | INTRAVENOUS | Status: AC
  Administered 2020-09-28 – 2020-09-29 (×2): 10 meq via INTRAVENOUS
  Filled 2020-09-28 (×2): qty 100

## 2020-09-28 MED ORDER — INSULIN ASPART 100 UNIT/ML ~~LOC~~ SOLN
10.0000 [IU] | Freq: Once | SUBCUTANEOUS | Status: AC
  Administered 2020-09-28: 10 [IU] via SUBCUTANEOUS
  Filled 2020-09-28: qty 10

## 2020-09-28 NOTE — ED Notes (Signed)
Date and time results received: 09/28/20 2235 (use smartphrase ".now" to insert current time)  Test: Potassium Critical Value: 2.7  Name of Provider Notified: Marijean Bravo, [place]   Orders Received? Or Actions Taken?: none

## 2020-09-28 NOTE — ED Notes (Signed)
Date and time results received: 09/28/20 2235 (use smartphrase ".now" to insert current time)  Test: Glucose Critical Value: 509  Name of Provider Notified: Marijean Bravo, [place]   Orders Received? Or Actions Taken?: none

## 2020-09-28 NOTE — ED Triage Notes (Signed)
Hyperglycemia >800 at doctor office today. Pt has been noncompliant with insulin x several days. Also reports that she may have a diverticulitis flare up with LLQ pain.

## 2020-09-28 NOTE — ED Notes (Signed)
EDP notified of patient's request for more pain meds.

## 2020-09-28 NOTE — ED Provider Notes (Signed)
[facility] EMERGENCY DEPARTMENT Provider Note   CSN: [id number] Arrival date & time: 09/28/20  2043     History Chief Complaint  Patient presents with  . Hyperglycemia    Jodi Mcgee is a 34 y.o. female.  Jodi Mcgee is a 34 y.o. female with a history of diabetes, GERD, IBS, anxiety, PE, who presents to the emergency department for evaluation of hyperglycemia, nausea, vomiting and abdominal pain.  Patient reports that she went to see her PCP today due to worsening left lower quadrant pain, she states she has been dealing with this pain for the past 2 months after she had an episode of diverticulitis that was treated with antibiotics and seemed to resolve but she is continued to have pain, this pain has been worsening over the past 3 days prompting her visit to the PCP, they checked lab work clear and then she was called and directed to the emergency department due to hyperglycemia with a glucose of 800.  Patient reports that she has not been taking her insulin over the past 3 to 4 days, but did have some improvement in her car and after they called her she gave herself 30 units of insulin prior to coming in.  She reports intermittent episodes of vomiting over the past few days.  No fevers or chills but reports feeling malaise.  No chest pain, shortness of breath.  Patient denies diarrhea, constipation or blood in the stool.  No dysuria or urinary frequency.  No other aggravating or alleviating factors.        Past Medical History:  Diagnosis Date  . Anemia   . Anxiety   . Blood transfusion without reported diagnosis    both CS  . Diabetes mellitus without complication ([place])   . GERD (gastroesophageal reflux disease)    has resolved  . IBS (irritable bowel syndrome)   . Ovarian cyst   . Peritonitis, acute generalized ([place])   . Pulmonary embolism ([place])   . Sepsis [facility])     Patient Active Problem List   Diagnosis Date Noted  . Controlled type 2 diabetes  mellitus with hyperglycemia ([place]) 05/10/2020  . Partial small bowel obstruction ([place]) 05/09/2020  . Bartholin's gland abscess 02/21/2019  . DKA (diabetic ketoacidoses) 02/20/2019  . Blood per rectum 02/20/2019  . Hyponatremia 09/26/2018  . Chronic hypertension 01/03/2018  . IUD contraception 01/03/2018  . Pulmonary embolism during puerperium 12/04/2017  . Pulmonary edema 11/30/2017  . Pelvic adhesive disease 10/22/2017  . Nasal septal perforation 10/17/2017  . History of cocaine abuse ([place]) 10/17/2017  . Epistaxis 10/17/2017  . 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 Obesity (BMI 30-39.9) 09/25/2017  . Type 2 diabetes mellitus, without long-term current use of insulin ([place]) 08/23/2017  . Chronic pain syndrome 08/23/2017    Past Surgical History:  Procedure Laterality Date  . APPENDECTOMY     ruptured   . BUNIONECTOMY    . CESAREAN SECTION    . CESAREAN SECTION Bilateral 11/20/2017   Procedure: CESAREAN SECTION;  Surgeon: Sloan Leiter, MD;  Location: [facility];  Service: Obstetrics;  Laterality: Bilateral;  . OVARIAN CYST DRAINAGE    . SMALL BOWEL REPAIR       OB History    Gravida  3   Para  3   Term  1   Preterm  2   AB  0   Living  3     SAB  0   TAB  0   Ectopic  0   Multiple  0   Live Births  3           Family History  Problem Relation Age of Onset  . Hypertension Mother   . Anemia Mother   . Diabetes Father     Social History   Tobacco Use  . Smoking status: Light Tobacco Smoker    Packs/day: 0.50    Types: Cigarettes  . Smokeless tobacco: Never Used  . Tobacco comment: social use with tobacco  Vaping Use  . Vaping Use: Never used  Substance Use Topics  . Alcohol use: Yes    Comment: 3 times a month  . Drug use: Not Currently    Home Medications Prior to Admission medications   Medication Sig Start Date End Date Taking? Authorizing Provider  acetaminophen (TYLENOL) 325 MG tablet Take 650 mg by mouth every 6  (six) hours as needed for mild pain or headache.    [provider]  amoxicillin (AMOXIL) 500 MG capsule Take 1,000 mg by mouth 2 (two) times daily. 06/01/20   [provider]  amoxicillin-clavulanate (AUGMENTIN) 500-125 MG tablet Take 1 tablet (500 mg total) by mouth every 8 (eight) hours. 08/08/20   Varney Biles, MD  blood glucose meter kit and supplies KIT Dispense based on patient and insurance preference. Use up to four times daily as directed. (FOR ICD-9 250.00, 250.01). 02/21/19   Oretha Milch D, MD  ciprofloxacin (CIPRO) 500 MG tablet Take 500 mg by mouth 2 (two) times daily. 06/01/20   [provider]  clarithromycin (BIAXIN) 500 MG tablet Take 500 mg by mouth 2 (two) times daily. 06/01/20   [provider]  dicyclomine (BENTYL) 20 MG tablet Take 20 mg by mouth 2 (two) times daily. 05/27/20   [provider]  HUMULIN R 100 UNIT/ML injection Inject 0-20 Units into the skin 3 (three) times daily before meals. Per sliding scale 03/24/20   [provider]  insulin detemir (LEVEMIR) 100 unit/ml SOLN Inject 0.2 mLs (20 Units total) into the skin 3 (three) times daily. 05/12/20   Samuella Cota, MD  Levonorgestrel (LILETTA) 19.5 MCG/DAY IUD IUD 1 each by Intrauterine route continuous. Placed in 2019    [provider]  metFORMIN (GLUCOPHAGE) 1000 MG tablet Take 1 tablet (1,000 mg total) by mouth 2 (two) times daily with a meal. 05/17/19   Scot Jun, FNP  methocarbamol (ROBAXIN) 500 MG tablet Take 1 tablet (500 mg total) by mouth every 8 (eight) hours as needed for muscle spasms. 05/02/20   Orpah Greek, MD  metroNIDAZOLE (FLAGYL) 500 MG tablet Take 500 mg by mouth 2 (two) times daily. 06/01/20   [provider]  ondansetron (ZOFRAN ODT) 8 MG disintegrating tablet Take 1 tablet (8 mg total) by mouth every 8 (eight) hours as needed for nausea. 08/08/20   Varney Biles, MD  oxyCODONE-acetaminophen (PERCOCET/ROXICET)  5-325 MG tablet Take 1 tablet by mouth every 6 (six) hours as needed. 06/01/20   [provider]  polyethylene glycol (MIRALAX / GLYCOLAX) 17 g packet Take 1 packet with 8 ounces of water daily as needed for constipation. 05/14/20   Molpus, John, MD  potassium chloride SA (KLOR-CON) 20 MEQ tablet Take 1 tablet (20 mEq total) by mouth daily. 09/29/20   Jacqlyn Larsen, PA-C  promethazine (PHENERGAN) 25 MG tablet Take 1 tablet (25 mg total) by mouth every 8 (eight) hours as needed for nausea. 08/09/20   Alvino Chapel,  Ovid Curd, MD    Allergies    Cetirizine & related, Toradol [ketorolac tromethamine], Flagyl [metronidazole], Contrast media [iodinated diagnostic agents], and Nsaids  Review of Systems   Review of Systems  Constitutional: Positive for fatigue. Negative for chills and fever.  HENT: Negative.   Respiratory: Negative for cough and shortness of breath.   Cardiovascular: Negative for chest pain.  Gastrointestinal: Positive for abdominal pain, nausea and vomiting. Negative for constipation and diarrhea.  Genitourinary: Negative for dysuria and frequency.  Musculoskeletal: Negative for arthralgias and myalgias.  Skin: Negative for color change and rash.  Neurological: Negative for dizziness, syncope and light-headedness.    Physical Exam Updated Vital Signs BP (!) 113/99   Pulse 99   Temp 98.5 F (36.9 C) (Oral)   Resp 16   Ht $R'5\' 7"'Za$  (1.702 m)   Wt 70.8 kg   SpO2 92%   BMI 24.43 kg/m   Physical Exam Vitals and nursing note reviewed.  Constitutional:      General: She is not in acute distress.    Appearance: Normal appearance. She is well-developed and well-nourished. She is not diaphoretic.     Comments: Chronically ill-appearing but in no acute distress  HENT:     Head: Normocephalic and atraumatic.     Mouth/Throat:     Mouth: Oropharynx is clear and moist. Mucous membranes are dry.     Pharynx: Oropharynx is clear.  Eyes:     General:        Right eye: No  discharge.        Left eye: No discharge.     Extraocular Movements: EOM normal.  Cardiovascular:     Rate and Rhythm: Normal rate and regular rhythm.     Pulses: Normal pulses and intact distal pulses.     Heart sounds: Normal heart sounds. No murmur heard. No friction rub. No gallop.   Pulmonary:     Effort: Pulmonary effort is normal. No respiratory distress.     Breath sounds: Normal breath sounds. No wheezing or rales.     Comments: Respirations equal and unlabored, patient able to speak in full sentences, lungs clear to auscultation bilaterally Abdominal:     General: Bowel sounds are normal. There is no distension.     Palpations: Abdomen is soft. There is no mass.     Tenderness: There is abdominal tenderness. There is no guarding.     Comments: Abdomen is soft, nondistended, bowel sounds present throughout, there is focal tenderness in the left lower quadrant without guarding, all other quadrants nontender to palpation, no CVA tenderness bilaterally.  Musculoskeletal:        General: No deformity or edema.     Cervical back: Neck supple.  Skin:    General: Skin is warm and dry.     Capillary Refill: Capillary refill takes less than 2 seconds.  Neurological:     Mental Status: She is alert and oriented to person, place, and time.     Coordination: Coordination normal.     Comments: Speech is clear, able to follow commands Moves extremities without ataxia, coordination intact  Psychiatric:        Mood and Affect: Mood normal.        Behavior: Behavior normal.     ED Results / Procedures / Treatments   Labs (all labs ordered are listed, but only abnormal results are displayed) Labs Reviewed  URINALYSIS, ROUTINE W REFLEX MICROSCOPIC - Abnormal; Notable for the following components:  Result Value   Specific Gravity, Urine <1.005 (*)    Glucose, UA >=500 (*)    Ketones, ur 15 (*)    All other components within normal limits  COMPREHENSIVE METABOLIC PANEL - Abnormal;  Notable for the following components:   Sodium 132 (*)    Potassium 2.7 (*)    Chloride 90 (*)    Glucose, Bld 509 (*)    BUN 5 (*)    AST 12 (*)    All other components within normal limits  URINALYSIS, MICROSCOPIC (REFLEX) - Abnormal; Notable for the following components:   Bacteria, UA FEW (*)    All other components within normal limits  CBG MONITORING, ED - Abnormal; Notable for the following components:   Glucose-Capillary 574 (*)    All other components within normal limits  CBG MONITORING, ED - Abnormal; Notable for the following components:   Glucose-Capillary 314 (*)    All other components within normal limits  CBC WITH DIFFERENTIAL/PLATELET  MAGNESIUM    EKG None  Radiology CT Abdomen Pelvis Wo Contrast  Result Date: 09/28/2020 CLINICAL DATA:  Hyperglycemia and left lower quadrant pain. EXAM: CT ABDOMEN AND PELVIS WITHOUT CONTRAST TECHNIQUE: Multidetector CT imaging of the abdomen and pelvis was performed following the standard protocol without IV contrast. COMPARISON:  June 23, 2028 FINDINGS: Lower chest: No acute abnormality. Hepatobiliary: No focal liver abnormality is seen. No gallstones, gallbladder wall thickening, or biliary dilatation. Pancreas: Unremarkable. No pancreatic ductal dilatation or surrounding inflammatory changes. Spleen: Normal in size without focal abnormality. Adrenals/Urinary Tract: There is a stable 2.0 cm x 1.1 cm low-attenuation right adrenal mass. The left adrenal gland is normal in appearance. Kidneys are normal, without renal calculi, focal lesion, or hydronephrosis. Bladder is unremarkable. Stomach/Bowel: Stomach is within normal limits. The appendix is surgically absent. No evidence of bowel wall thickening, distention, or inflammatory changes. Vascular/Lymphatic: No significant vascular findings are present. No enlarged abdominal or pelvic lymph nodes. Reproductive: An IUD is seen within an otherwise normal appearing uterus. The bilateral  adnexa are unremarkable. Other: No abdominal wall hernia or abnormality. No abdominopelvic ascites. Musculoskeletal: No acute or significant osseous findings. IMPRESSION: 1. 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 2. IUD in place. Electronically Signed   By: Aram Candela M.D.   On: 09/28/2020 22:16    Procedures Procedures (including critical care time)  Medications Ordered in ED Medications  potassium chloride 10 mEq in 100 mL IVPB (10 mEq Intravenous New Bag/Given 09/28/20 2314)  lactated ringers bolus 1,000 mL (0 mLs Intravenous Stopped 09/28/20 2305)  ondansetron (ZOFRAN) injection 4 mg (4 mg Intravenous Given 09/28/20 2147)  morphine 4 MG/ML injection 4 mg (4 mg Intravenous Given 09/28/20 2148)  lactated ringers bolus 1,000 mL (0 mLs Intravenous Stopped 09/28/20 2353)  insulin aspart (novoLOG) injection 10 Units (10 Units Subcutaneous Given 09/28/20 2246)  morphine 4 MG/ML injection 4 mg (4 mg Intravenous Given 09/28/20 2246)  fentaNYL (SUBLIMAZE) injection 50 mcg (50 mcg Intravenous Given 09/28/20 2310)  potassium chloride SA (KLOR-CON) CR tablet 40 mEq (40 mEq Oral Given 09/28/20 2314)    ED Course  I have reviewed the triage vital signs and the nursing notes.  Pertinent labs & imaging results that were available during my care of the patient were reviewed by me and considered in my medical decision making (see chart for details).  Clinical Course as of 09/30/20 2215  Thu Sep 30, 2020  2212 HCT: 41.7 [KF]    Clinical  Course User Index [KF] Janet Berlin   MDM Rules/Calculators/A&P                          34 year old female who arrives with hyperglycemia, nausea, vomiting and left lower quadrant abdominal pain.  Saw her PCP today due to worsening abdominal pain and was found to have blood sugars of 800, has not been taking any of her insulin over the past 3 to 4 days.  Has been dealing with 2 months of abdominal pain but states it  is worsened over the past few days she is focally tender in the left lower quadrant.  Will check basic labs, blood sugar of 574 initially on arrival.  Patient is mentating well, no coo small breathing.  We will also check CT abdomen pelvis to rule out flare of diverticulitis.  IV fluids, pain and nausea medication given.  I have independently ordered, reviewed and interpreted all labs and imaging: CBC: No leukocytosis, normal hemoglobin CMP: Glucose of 509, but no anion gap, mild hyponatremia as expected in the setting of elevated glucose.  Potassium of 2.7, but normal magnesium.  Normal CO2.  No signs of DKA. UA: No evidence of infection.  Patient given additional liter of fluids and 10 units of insulin.  CT abdomen pelvis with no evidence of diverticulitis or other acute intra-abdominal problem.  There is a stable low-attenuation right adrenal mass, likely representing an adrenal adenoma, IUD is in place.  No other findings noted.  Pain is improved, recommend that patient follow-up with her GI doctor for continued evaluation of this left lower quadrant pain.  Her blood sugar has improved into the 300s.  She has been given p.o. potassium replacement as well as IV replacement, and prescribed potassium to take over the next few days.  Will have to have this rechecked with primary doctor.  At this time she is stable for discharge home, return precautions discussed.  Patient expresses understanding and agreement.  Final Clinical Impression(s) / ED Diagnoses Final diagnoses:  Hyperglycemia  Hypokalemia  LLQ pain    Rx / DC Orders ED Discharge Orders         Ordered    potassium chloride SA (KLOR-CON) 20 MEQ tablet  Daily        09/29/20 0027           Jacqlyn Larsen, PA-C 09/30/20 2215    Quintella Reichert, MD 10/03/20 1857

## 2020-09-29 LAB — COMPREHENSIVE METABOLIC PANEL
ALT: 12 U/L (ref 0–44)
AST: 12 U/L — ABNORMAL LOW (ref 15–41)
Albumin: 3.6 g/dL (ref 3.5–5.0)
Alkaline Phosphatase: 81 U/L (ref 38–126)
Anion gap: 12 (ref 5–15)
BUN: 5 mg/dL — ABNORMAL LOW (ref 6–20)
CO2: 30 mmol/L (ref 22–32)
Calcium: 9.1 mg/dL (ref 8.9–10.3)
Chloride: 90 mmol/L — ABNORMAL LOW (ref 98–111)
Creatinine, Ser: 0.59 mg/dL (ref 0.44–1.00)
GFR, Estimated: >60 mL/min (ref >60)
Glucose, Bld: 509 mg/dL (ref 70–99)
Potassium: 2.7 mmol/L — CL (ref 3.5–5.1)
Sodium: 132 mmol/L — ABNORMAL LOW (ref 135–145)
Total Bilirubin: 0.8 mg/dL (ref 0.3–1.2)
Total Protein: 7.7 g/dL (ref 6.5–8.1)

## 2020-09-29 LAB — CBG MONITORING, ED: Glucose-Capillary: 314 mg/dL — ABNORMAL HIGH (ref 70–99)

## 2020-09-29 MED ORDER — DIPHENHYDRAMINE HCL 25 MG PO CAPS
25.0000 mg | ORAL_CAPSULE | Freq: Once | ORAL | Status: AC
  Administered 2020-09-29: 25 mg via ORAL
  Filled 2020-09-29: qty 1

## 2020-09-29 MED ORDER — POTASSIUM CHLORIDE CRYS ER 20 MEQ PO TBCR: 20.0000 meq | EXTENDED_RELEASE_TABLET | Freq: Every day | ORAL | 0 refills | Status: DC

## 2020-09-29 NOTE — Discharge Instructions (Signed)
Your CT did not show any evidence of diverticulitis, continue to follow-up with your GI doctor regarding this pain. Your blood sugars have improved is extremely important that you take your insulin regularly as prescribed. Your potassium was low today, please take potassium tablets for the next 5 days and have this rechecked with your regular doctor.  Return for any new or worsening symptoms.

## 2020-10-31 ENCOUNTER — Emergency Department (HOSPITAL_BASED_OUTPATIENT_CLINIC_OR_DEPARTMENT_OTHER)
Admission: EM | Admit: 2020-10-31 | Discharge: 2020-10-31 | Disposition: A | Discharge status: Home / Self Care | Payer: Medicaid Other | Attending: Emergency Medicine | Admitting: Emergency Medicine

## 2020-10-31 ENCOUNTER — Emergency Department (HOSPITAL_BASED_OUTPATIENT_CLINIC_OR_DEPARTMENT_OTHER): Payer: Medicaid Other

## 2020-10-31 ENCOUNTER — Other Ambulatory Visit: Payer: Self-pay

## 2020-10-31 ENCOUNTER — Encounter (HOSPITAL_BASED_OUTPATIENT_CLINIC_OR_DEPARTMENT_OTHER): Payer: Self-pay | Admitting: *Deleted

## 2020-10-31 DIAGNOSIS — Z794 Long term (current) use of insulin: Secondary | ICD-10-CM | POA: Diagnosis not present

## 2020-10-31 DIAGNOSIS — Z79899 Other long term (current) drug therapy: Secondary | ICD-10-CM | POA: Insufficient documentation

## 2020-10-31 DIAGNOSIS — R1032 Left lower quadrant pain: Secondary | ICD-10-CM | POA: Diagnosis not present

## 2020-10-31 DIAGNOSIS — R1012 Left upper quadrant pain: Secondary | ICD-10-CM | POA: Diagnosis not present

## 2020-10-31 DIAGNOSIS — F17210 Nicotine dependence, cigarettes, uncomplicated: Secondary | ICD-10-CM | POA: Insufficient documentation

## 2020-10-31 DIAGNOSIS — R1013 Epigastric pain: Secondary | ICD-10-CM | POA: Insufficient documentation

## 2020-10-31 DIAGNOSIS — E119 Type 2 diabetes mellitus without complications: Secondary | ICD-10-CM | POA: Diagnosis not present

## 2020-10-31 LAB — CBC WITH DIFFERENTIAL/PLATELET
Abs Immature Granulocytes: 0.01 10*3/uL (ref 0.00–0.07)
Basophils Absolute: 0 10*3/uL (ref 0.0–0.1)
Basophils Relative: 0 %
Eosinophils Absolute: 0.1 10*3/uL (ref 0.0–0.5)
Eosinophils Relative: 1 %
HCT: 41.3 % (ref 36.0–46.0)
Hemoglobin: 13.9 g/dL (ref 12.0–15.0)
Immature Granulocytes: 0 %
Lymphocytes Relative: 30 %
Lymphs Abs: 1.5 10*3/uL (ref 0.7–4.0)
MCH: 28.1 pg (ref 26.0–34.0)
MCHC: 33.7 g/dL (ref 30.0–36.0)
MCV: 83.4 fL (ref 80.0–100.0)
Monocytes Absolute: 0.3 10*3/uL (ref 0.1–1.0)
Monocytes Relative: 6 %
Neutro Abs: 3.3 10*3/uL (ref 1.7–7.7)
Neutrophils Relative %: 63 %
Platelets: 365 10*3/uL (ref 150–400)
RBC: 4.95 MIL/uL (ref 3.87–5.11)
RDW: 13.6 % (ref 11.5–15.5)
WBC: 5.2 10*3/uL (ref 4.0–10.5)
nRBC: 0 % (ref 0.0–0.2)

## 2020-10-31 LAB — COMPREHENSIVE METABOLIC PANEL
ALT: 11 U/L (ref 0–44)
AST: 16 U/L (ref 15–41)
Albumin: 3.7 g/dL (ref 3.5–5.0)
Alkaline Phosphatase: 77 U/L (ref 38–126)
Anion gap: 13 (ref 5–15)
BUN: 9 mg/dL (ref 6–20)
CO2: 22 mmol/L (ref 22–32)
Calcium: 9.3 mg/dL (ref 8.9–10.3)
Chloride: 90 mmol/L — ABNORMAL LOW (ref 98–111)
Creatinine, Ser: 0.75 mg/dL (ref 0.44–1.00)
GFR, Estimated: >60 mL/min (ref >60)
Glucose, Bld: 547 mg/dL (ref 70–99)
Potassium: 4 mmol/L (ref 3.5–5.1)
Sodium: 125 mmol/L — ABNORMAL LOW (ref 135–145)
Total Bilirubin: 0.7 mg/dL (ref 0.3–1.2)
Total Protein: 7.7 g/dL (ref 6.5–8.1)

## 2020-10-31 LAB — URINALYSIS, ROUTINE W REFLEX MICROSCOPIC
Bilirubin Urine: NEGATIVE
Glucose, UA: >=500 mg/dL — AB
Hgb urine dipstick: NEGATIVE
Ketones, ur: NEGATIVE mg/dL
Leukocytes,Ua: NEGATIVE
Nitrite: NEGATIVE
Protein, ur: NEGATIVE mg/dL
Specific Gravity, Urine: 1.01 (ref 1.005–1.030)
pH: 5 (ref 5.0–8.0)

## 2020-10-31 LAB — URINALYSIS, MICROSCOPIC (REFLEX)

## 2020-10-31 LAB — CBG MONITORING, ED: Glucose-Capillary: 381 mg/dL — ABNORMAL HIGH (ref 70–99)

## 2020-10-31 LAB — HCG, SERUM, QUALITATIVE: Preg, Serum: NEGATIVE

## 2020-10-31 LAB — LIPASE, BLOOD: Lipase: 29 U/L (ref 11–51)

## 2020-10-31 MED ORDER — DIPHENHYDRAMINE HCL 50 MG/ML IJ SOLN
25.0000 mg | Freq: Once | INTRAMUSCULAR | Status: AC
  Administered 2020-10-31: 25 mg via INTRAVENOUS
  Filled 2020-10-31: qty 1

## 2020-10-31 MED ORDER — MORPHINE SULFATE (PF) 4 MG/ML IV SOLN
4.0000 mg | Freq: Once | INTRAVENOUS | Status: AC
  Administered 2020-10-31: 4 mg via INTRAVENOUS
  Filled 2020-10-31: qty 1

## 2020-10-31 MED ORDER — SODIUM CHLORIDE 0.9 % IV BOLUS
1000.0000 mL | Freq: Once | INTRAVENOUS | Status: AC
  Administered 2020-10-31: 1000 mL via INTRAVENOUS

## 2020-10-31 MED ORDER — INSULIN ASPART 100 UNIT/ML ~~LOC~~ SOLN
10.0000 [IU] | Freq: Once | SUBCUTANEOUS | Status: AC
  Administered 2020-10-31: 10 [IU] via INTRAVENOUS
  Filled 2020-10-31: qty 10

## 2020-10-31 MED ORDER — ONDANSETRON HCL 4 MG/2ML IJ SOLN
4.0000 mg | Freq: Once | INTRAMUSCULAR | Status: AC
  Administered 2020-10-31: 4 mg via INTRAVENOUS
  Filled 2020-10-31: qty 2

## 2020-10-31 MED ORDER — DICYCLOMINE HCL 10 MG PO CAPS
10.0000 mg | ORAL_CAPSULE | Freq: Once | ORAL | Status: AC
  Administered 2020-10-31: 10 mg via ORAL
  Filled 2020-10-31: qty 1

## 2020-10-31 NOTE — Discharge Instructions (Addendum)
Continue medications as previously prescribed.  Follow-up with your gastroenterologist later this week, and return to the ER if symptoms significantly worsen or change.

## 2020-10-31 NOTE — ED Notes (Signed)
EDP at bedside  

## 2020-10-31 NOTE — ED Triage Notes (Signed)
Pt /co right upper abd pain that started one week ago. States pain comes and goes. Has not been taking anything for pain. C/o nausea. Denies diarrhea. Denies any fevers.

## 2020-10-31 NOTE — ED Provider Notes (Signed)
[facility] EMERGENCY DEPARTMENT Provider Note   CSN: [id number] Arrival date & time: 10/31/20  0304     History Chief Complaint  Patient presents with  . Abdominal Pain    Jodi Mcgee is a 35 y.o. female.  Patient is a 35 year old female with history of diabetes, GERD, prior pulmonary embolism, and chronic abdominal pain for which she is currently undergoing work-up.  She tells me she was to have a colonoscopy recently, however her bowel prep was not effective enough to proceed with the procedure.  She presents tonight with a 3-day history of pain to the epigastric region and left flank.  The pain radiates to her left lower quadrant and feels like "someone threw a brick at her".  She denies any bowel or bladder complaints.  She denies any bloody stool or vomit.  The history is provided by the patient.  Abdominal Pain Pain location:  LLQ, LUQ and epigastric Pain quality: cramping   Pain radiates to:  L flank Pain severity:  Moderate Onset quality:  Gradual Duration:  3 days Timing:  Constant Progression:  Worsening Chronicity:  Recurrent Relieved by:  Nothing Worsened by:  Movement and palpation      Past Medical History:  Diagnosis Date  . Anemia   . Anxiety   . Blood transfusion without reported diagnosis    both CS  . Diabetes mellitus without complication ([place])   . GERD (gastroesophageal reflux disease)    has resolved  . IBS (irritable bowel syndrome)   . Ovarian cyst   . Peritonitis, acute generalized ([place])   . Pulmonary embolism ([place])   . Sepsis [facility])     Patient Active Problem List   Diagnosis Date Noted  . Controlled type 2 diabetes mellitus with hyperglycemia ([place]) 05/10/2020  . Partial small bowel obstruction ([place]) 05/09/2020  . Bartholin's gland abscess 02/21/2019  . DKA (diabetic ketoacidoses) 02/20/2019  . Blood per rectum 02/20/2019  . Hyponatremia 09/26/2018  . Chronic hypertension 01/03/2018  . IUD contraception 01/03/2018   . Pulmonary embolism during puerperium 12/04/2017  . Pulmonary edema 11/30/2017  . Pelvic adhesive disease 10/22/2017  . Nasal septal perforation 10/17/2017  . History of cocaine abuse ([place]) 10/17/2017  . Epistaxis 10/17/2017  . 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 Obesity (BMI 30-39.9) 09/25/2017  . Type 2 diabetes mellitus, without long-term current use of insulin ([place]) 08/23/2017  . Chronic pain syndrome 08/23/2017    Past Surgical History:  Procedure Laterality Date  . APPENDECTOMY     ruptured   . BUNIONECTOMY    . CESAREAN SECTION    . CESAREAN SECTION Bilateral 11/20/2017   Procedure: CESAREAN SECTION;  Surgeon: Sloan Leiter, MD;  Location: [place];  Service: Obstetrics;  Laterality: Bilateral;  . OVARIAN CYST DRAINAGE    . SMALL BOWEL REPAIR       OB History    Gravida  3   Para  3   Term  1   Preterm  2   AB  0   Living  3     SAB  0   IAB  0   Ectopic  0   Multiple  0   Live Births  3           Family History  Problem Relation Age of Onset  . Hypertension Mother   . Anemia Mother   . Diabetes Father     Social History   Tobacco Use  . Smoking  status: Light Tobacco Smoker    Packs/day: 0.50    Types: Cigarettes  . Smokeless tobacco: Never Used  . Tobacco comment: social use with tobacco  Vaping Use  . Vaping Use: Never used  Substance Use Topics  . Alcohol use: Yes    Comment: 3 times a month  . Drug use: Not Currently    Home Medications Prior to Admission medications   Medication Sig Start Date End Date Taking? Authorizing Provider  blood glucose meter kit and supplies KIT Dispense based on patient and insurance preference. Use up to four times daily as directed. (FOR ICD-9 250.00, 250.01). 02/21/19   Oretha Milch D, MD  dicyclomine (BENTYL) 20 MG tablet Take 20 mg by mouth 2 (two) times daily. 05/27/20   [provider]  HUMULIN R 100 UNIT/ML injection Inject 0-20 Units into the skin 3 (three)  times daily before meals. Per sliding scale 03/24/20   [provider]  HYDROcodone-acetaminophen (NORCO/VICODIN) 5-325 MG tablet Take 1 tablet by mouth every 6 (six) hours as needed. 06/09/20   [provider]  insulin detemir (LEVEMIR) 100 unit/ml SOLN Inject 0.2 mLs (20 Units total) into the skin 3 (three) times daily. 05/12/20   Samuella Cota, MD  Levonorgestrel (LILETTA) 19.5 MCG/DAY IUD IUD 1 each by Intrauterine route continuous. Placed in 2019    [provider]  methocarbamol (ROBAXIN) 500 MG tablet Take 1 tablet (500 mg total) by mouth every 8 (eight) hours as needed for muscle spasms. 05/02/20   Orpah Greek, MD  ondansetron (ZOFRAN ODT) 8 MG disintegrating tablet Take 1 tablet (8 mg total) by mouth every 8 (eight) hours as needed for nausea. 08/08/20   Varney Biles, MD  oxyCODONE-acetaminophen (PERCOCET/ROXICET) 5-325 MG tablet Take 1 tablet by mouth every 6 (six) hours as needed. 06/01/20   [provider]  potassium chloride SA (KLOR-CON) 20 MEQ tablet Take 1 tablet (20 mEq total) by mouth daily. 09/29/20   Jacqlyn Larsen, PA-C    Allergies    Cetirizine & related, Toradol [ketorolac tromethamine], Flagyl [metronidazole], Contrast media [iodinated diagnostic agents], and Nsaids  Review of Systems   Review of Systems  Gastrointestinal: Positive for abdominal pain.  All other systems reviewed and are negative.   Physical Exam Updated Vital Signs BP 121/75   Pulse (!) 112   Temp 98 F (36.7 C)   Resp 18   Ht '5\' 7"'  (1.702 m)   Wt 67 kg   SpO2 98%   BMI 23.15 kg/m   Physical Exam Vitals and nursing note reviewed.  Constitutional:      General: She is not in acute distress.    Appearance: She is well-developed and well-nourished. She is not diaphoretic.  HENT:     Head: Normocephalic and atraumatic.  Cardiovascular:     Rate and Rhythm: Normal rate and regular rhythm.     Heart sounds: No murmur heard. No friction rub.  No gallop.   Pulmonary:     Effort: Pulmonary effort is normal. No respiratory distress.     Breath sounds: Normal breath sounds. No wheezing.  Abdominal:     General: Bowel sounds are normal. There is no distension.     Palpations: Abdomen is soft.     Tenderness: There is abdominal tenderness in the epigastric area, left upper quadrant and left lower quadrant. There is left CVA tenderness. There is no right CVA tenderness, guarding or rebound.  Musculoskeletal:  General: Normal range of motion.     Cervical back: Normal range of motion and neck supple.  Skin:    General: Skin is warm and dry.  Neurological:     Mental Status: She is alert and oriented to person, place, and time.     ED Results / Procedures / Treatments   Labs (all labs ordered are listed, but only abnormal results are displayed) Labs Reviewed  COMPREHENSIVE METABOLIC PANEL  CBC WITH DIFFERENTIAL/PLATELET  LIPASE, BLOOD  URINALYSIS, ROUTINE W REFLEX MICROSCOPIC  HCG, SERUM, QUALITATIVE    EKG None  Radiology No results found.  Procedures Procedures (including critical care time)  Medications Ordered in ED Medications  sodium chloride 0.9 % bolus 1,000 mL (has no administration in time range)  ondansetron (ZOFRAN) injection 4 mg (has no administration in time range)  morphine 4 MG/ML injection 4 mg (has no administration in time range)    ED Course  I have reviewed the triage vital signs and the nursing notes.  Pertinent labs & imaging results that were available during my care of the patient were reviewed by me and considered in my medical decision making (see chart for details).    MDM Rules/Calculators/A&P  Patient is a 35 year old female with history of chronic abdominal pain presenting with left-sided and epigastric pain, the etiology of which I am uncertain.  Patient's work-up shows unremarkable laboratory studies and CT scan showing no acute process.  Patient feeling better after  receiving IV fluids and medications.  Remainder of the work-up was unremarkable with the exception of a blood sugar of 500.  She was given intravenous insulin and her sugars have improved into the 300s.  At this point, patient seems appropriate for discharge.  She is to follow-up with her gastroenterologist in the near future.  Final Clinical Impression(s) / ED Diagnoses Final diagnoses:  None    Rx / DC Orders ED Discharge Orders    None       Veryl Speak, MD 11/01/20 [phone]

## 2020-11-03 ENCOUNTER — Other Ambulatory Visit: Payer: Self-pay

## 2020-11-03 DIAGNOSIS — E1165 Type 2 diabetes mellitus with hyperglycemia: Secondary | ICD-10-CM | POA: Diagnosis not present

## 2020-11-03 DIAGNOSIS — F17210 Nicotine dependence, cigarettes, uncomplicated: Secondary | ICD-10-CM | POA: Insufficient documentation

## 2020-11-03 DIAGNOSIS — Z794 Long term (current) use of insulin: Secondary | ICD-10-CM | POA: Diagnosis not present

## 2020-11-03 DIAGNOSIS — R1012 Left upper quadrant pain: Secondary | ICD-10-CM | POA: Diagnosis present

## 2020-11-03 MED ORDER — FENTANYL CITRATE (PF) 100 MCG/2ML IJ SOLN
50.0000 ug | INTRAMUSCULAR | Status: AC | PRN
  Administered 2020-11-04 (×2): 50 ug via INTRAVENOUS
  Filled 2020-11-03 (×2): qty 2

## 2020-11-03 MED ORDER — ONDANSETRON HCL 4 MG/2ML IJ SOLN
4.0000 mg | Freq: Once | INTRAMUSCULAR | Status: AC | PRN
  Administered 2020-11-04: 4 mg via INTRAVENOUS
  Filled 2020-11-03: qty 2

## 2020-11-03 NOTE — ED Triage Notes (Signed)
Pt seen on 1/9 for abd pain. Today pt states symptoms have significantly worsened. Pt reports LUQ abd pain, N/V, oilly diarrhea. Pt also reports her BGL has been running high at home. Denies any fever.

## 2020-11-04 ENCOUNTER — Encounter (HOSPITAL_BASED_OUTPATIENT_CLINIC_OR_DEPARTMENT_OTHER): Payer: Self-pay

## 2020-11-04 ENCOUNTER — Emergency Department (HOSPITAL_BASED_OUTPATIENT_CLINIC_OR_DEPARTMENT_OTHER)
Admission: EM | Admit: 2020-11-04 | Discharge: 2020-11-04 | Disposition: A | Discharge status: Home / Self Care | Payer: Medicaid Other | Attending: Emergency Medicine | Admitting: Emergency Medicine

## 2020-11-04 DIAGNOSIS — R1012 Left upper quadrant pain: Secondary | ICD-10-CM

## 2020-11-04 DIAGNOSIS — R739 Hyperglycemia, unspecified: Secondary | ICD-10-CM

## 2020-11-04 HISTORY — DX: Unspecified intestinal obstruction, unspecified as to partial versus complete obstruction: K56.609

## 2020-11-04 LAB — I-STAT VENOUS BLOOD GAS, ED
Acid-Base Excess: 3 mmol/L — ABNORMAL HIGH (ref 0.0–2.0)
Acid-Base Excess: 3 mmol/L — ABNORMAL HIGH (ref 0.0–2.0)
Bicarbonate: 31 mmol/L — ABNORMAL HIGH (ref 20.0–28.0)
Bicarbonate: 31.1 mmol/L — ABNORMAL HIGH (ref 20.0–28.0)
Calcium, Ion: 1.29 mmol/L (ref 1.15–1.40)
Calcium, Ion: 1.29 mmol/L (ref 1.15–1.40)
HCT: 38 % (ref 36.0–46.0)
HCT: 38 % (ref 36.0–46.0)
Hemoglobin: 12.9 g/dL (ref 12.0–15.0)
Hemoglobin: 12.9 g/dL (ref 12.0–15.0)
O2 Saturation: 25 %
O2 Saturation: 26 %
Patient temperature: 98.3
Patient temperature: 98.3
Potassium: 3.1 mmol/L — ABNORMAL LOW (ref 3.5–5.1)
Potassium: 3.1 mmol/L — ABNORMAL LOW (ref 3.5–5.1)
Sodium: 137 mmol/L (ref 135–145)
Sodium: 137 mmol/L (ref 135–145)
TCO2: 33 mmol/L — ABNORMAL HIGH (ref 22–32)
TCO2: 33 mmol/L — ABNORMAL HIGH (ref 22–32)
pCO2, Ven: 60.7 mmHg — ABNORMAL HIGH (ref 44.0–60.0)
pCO2, Ven: 61.2 mmHg — ABNORMAL HIGH (ref 44.0–60.0)
pH, Ven: 7.312 (ref 7.250–7.430)
pH, Ven: 7.317 (ref 7.250–7.430)
pO2, Ven: 19 mmHg — CL (ref 32.0–45.0)
pO2, Ven: 19 mmHg — CL (ref 32.0–45.0)

## 2020-11-04 LAB — CBG MONITORING, ED
Glucose-Capillary: 120 mg/dL — ABNORMAL HIGH (ref 70–99)
Glucose-Capillary: 201 mg/dL — ABNORMAL HIGH (ref 70–99)
Glucose-Capillary: 344 mg/dL — ABNORMAL HIGH (ref 70–99)
Glucose-Capillary: 521 mg/dL (ref 70–99)
Glucose-Capillary: >600 mg/dL (ref 70–99)

## 2020-11-04 LAB — COMPREHENSIVE METABOLIC PANEL
ALT: 10 U/L (ref 0–44)
AST: 12 U/L — ABNORMAL LOW (ref 15–41)
Albumin: 3.7 g/dL (ref 3.5–5.0)
Alkaline Phosphatase: 68 U/L (ref 38–126)
Anion gap: 10 (ref 5–15)
BUN: 8 mg/dL (ref 6–20)
CO2: 26 mmol/L (ref 22–32)
Calcium: 9 mg/dL (ref 8.9–10.3)
Chloride: 91 mmol/L — ABNORMAL LOW (ref 98–111)
Creatinine, Ser: 0.63 mg/dL (ref 0.44–1.00)
GFR, Estimated: >60 mL/min (ref >60)
Glucose, Bld: 633 mg/dL (ref 70–99)
Potassium: 3.6 mmol/L (ref 3.5–5.1)
Sodium: 127 mmol/L — ABNORMAL LOW (ref 135–145)
Total Bilirubin: 0.8 mg/dL (ref 0.3–1.2)
Total Protein: 7.6 g/dL (ref 6.5–8.1)

## 2020-11-04 LAB — URINALYSIS, MICROSCOPIC (REFLEX): RBC / HPF: NONE SEEN RBC/hpf (ref 0–5)

## 2020-11-04 LAB — URINALYSIS, ROUTINE W REFLEX MICROSCOPIC
Bilirubin Urine: NEGATIVE
Glucose, UA: >=500 mg/dL — AB
Hgb urine dipstick: NEGATIVE
Ketones, ur: 5 mg/dL — AB
Leukocytes,Ua: NEGATIVE
Nitrite: NEGATIVE
Protein, ur: NEGATIVE mg/dL
Specific Gravity, Urine: 1.005 (ref 1.005–1.030)
pH: 5 (ref 5.0–8.0)

## 2020-11-04 LAB — CBC
HCT: 40.1 % (ref 36.0–46.0)
Hemoglobin: 13.2 g/dL (ref 12.0–15.0)
MCH: 28.1 pg (ref 26.0–34.0)
MCHC: 32.9 g/dL (ref 30.0–36.0)
MCV: 85.3 fL (ref 80.0–100.0)
Platelets: 329 10*3/uL (ref 150–400)
RBC: 4.7 MIL/uL (ref 3.87–5.11)
RDW: 13.4 % (ref 11.5–15.5)
WBC: 4.8 10*3/uL (ref 4.0–10.5)
nRBC: 0 % (ref 0.0–0.2)

## 2020-11-04 LAB — PREGNANCY, URINE: Preg Test, Ur: NEGATIVE

## 2020-11-04 LAB — LIPASE, BLOOD: Lipase: 31 U/L (ref 11–51)

## 2020-11-04 MED ORDER — DEXTROSE-NACL 5-0.9 % IV SOLN: INTRAVENOUS | Status: DC

## 2020-11-04 MED ORDER — DEXTROSE IN LACTATED RINGERS 5 % IV SOLN: INTRAVENOUS | Status: DC

## 2020-11-04 MED ORDER — FENTANYL CITRATE (PF) 100 MCG/2ML IJ SOLN
100.0000 ug | Freq: Once | INTRAMUSCULAR | Status: AC
  Administered 2020-11-04: 100 ug via INTRAVENOUS
  Filled 2020-11-04: qty 2

## 2020-11-04 MED ORDER — LACTATED RINGERS IV BOLUS
1000.0000 mL | Freq: Once | INTRAVENOUS | Status: AC
  Administered 2020-11-04: 1000 mL via INTRAVENOUS

## 2020-11-04 MED ORDER — SUCRALFATE 1 GM/10ML PO SUSP
1.0000 g | Freq: Once | ORAL | Status: AC
  Administered 2020-11-04: 1 g via ORAL
  Filled 2020-11-04: qty 10

## 2020-11-04 MED ORDER — PANTOPRAZOLE SODIUM 40 MG IV SOLR
40.0000 mg | Freq: Once | INTRAVENOUS | Status: AC
  Administered 2020-11-04: 40 mg via INTRAVENOUS
  Filled 2020-11-04: qty 40

## 2020-11-04 MED ORDER — OMEPRAZOLE 40 MG PO CPDR: 40.0000 mg | DELAYED_RELEASE_CAPSULE | Freq: Every day | ORAL | 0 refills | Status: DC

## 2020-11-04 MED ORDER — DIPHENHYDRAMINE HCL 50 MG/ML IJ SOLN
25.0000 mg | Freq: Once | INTRAMUSCULAR | Status: AC
  Administered 2020-11-04: 25 mg via INTRAVENOUS
  Filled 2020-11-04: qty 1

## 2020-11-04 MED ORDER — LACTATED RINGERS IV SOLN: INTRAVENOUS | Status: DC

## 2020-11-04 MED ORDER — SUCRALFATE 1 G PO TABS: 1.0000 g | ORAL_TABLET | Freq: Three times a day (TID) | ORAL | 1 refills | Status: DC

## 2020-11-04 MED ORDER — SODIUM CHLORIDE 0.9 % IV BOLUS
1000.0000 mL | Freq: Once | INTRAVENOUS | Status: AC
  Administered 2020-11-04: 1000 mL via INTRAVENOUS

## 2020-11-04 MED ORDER — DEXTROSE 50 % IV SOLN: 0.0000 mL | INTRAVENOUS | Status: DC | PRN

## 2020-11-04 MED ORDER — INSULIN REGULAR(HUMAN) IN NACL 100-0.9 UT/100ML-% IV SOLN
INTRAVENOUS | Status: DC
  Administered 2020-11-04: 14 [IU]/h via INTRAVENOUS
  Filled 2020-11-04: qty 100

## 2020-11-04 NOTE — ED Provider Notes (Signed)
[facility] DEPT [facility] Provider Note: Jodi Spurling, MD, FACEP  CSN: [id number] MRN: [id number] ARRIVAL: 11/03/20 at [place]: [place]  Abdominal Pain   HISTORY OF PRESENT ILLNESS  11/04/20 12:41 AM Jodi Mcgee is a 35 y.o. female with diabetes.  She is here with epigastric and left upper quadrant abdominal pain that began several days ago.  She was seen for this on 10/31/2020 and had CT stone study which was unremarkable except for prominent stool in the colon.  She returns with worsening pain.  It is sharp in nature and radiates into her back.  She rates it as a 9 out of 10, worse with movement or palpation.  She has had associated nausea and vomiting as well as oily diarrhea.  She has no history of steatorrhea.  He was given 50 mcg of fentanyl and 4 mg of Zofran IV in triage and an IV fluid bolus of 1000 was started.  Her sugar was noted to be over 600.  She feels her abdomen is distended.  She is also having new onset numbness of the soles of her feet.   Past Medical History:  Diagnosis Date  . Anemia   . Anxiety   . Blood transfusion without reported diagnosis    both CS  . Diabetes mellitus without complication ([place])   . GERD (gastroesophageal reflux disease)    has resolved  . IBS (irritable bowel syndrome)   . Ovarian cyst   . Peritonitis, acute generalized ([place])   . Pulmonary embolism ([place])   . SBO (small bowel obstruction) ([place])   . Sepsis [facility])     Past Surgical History:  Procedure Laterality Date  . APPENDECTOMY     ruptured   . BUNIONECTOMY    . CESAREAN SECTION    . CESAREAN SECTION Bilateral 11/20/2017   Procedure: CESAREAN SECTION;  Surgeon: Sloan Leiter, MD;  Location: [facility];  Service: Obstetrics;  Laterality: Bilateral;  . OVARIAN CYST DRAINAGE    . SMALL BOWEL REPAIR      Family History  Problem Relation Age of Onset  . Hypertension Mother   . Anemia Mother   . Diabetes Father     Social History    Tobacco Use  . Smoking status: Light Tobacco Smoker    Packs/day: 0.50    Types: Cigarettes  . Smokeless tobacco: Never Used  . Tobacco comment: social use with tobacco  Vaping Use  . Vaping Use: Some days  Substance Use Topics  . Alcohol use: Yes    Comment: 3 times a month  . Drug use: Not Currently    Prior to Admission medications   Medication Sig Start Date End Date Taking? Authorizing Provider  omeprazole (PRILOSEC) 40 MG capsule Take 1 capsule (40 mg total) by mouth daily. 11/04/20  Yes Adrienne Delay, MD  sucralfate (CARAFATE) 1 g tablet Take 1 tablet (1 g total) by mouth 4 (four) times daily -  with meals and at bedtime. 11/04/20  Yes Zerick Prevette, MD  blood glucose meter kit and supplies KIT Dispense based on patient and insurance preference. Use up to four times daily as directed. (FOR ICD-9 250.00, 250.01). 02/21/19   Oretha Milch D, MD  dicyclomine (BENTYL) 20 MG tablet Take 20 mg by mouth 2 (two) times daily. 05/27/20   [provider]  HUMULIN R 100 UNIT/ML injection Inject 0-20 Units into the skin 3 (three) times daily before meals. Per sliding scale 03/24/20  [provider]  HYDROcodone-acetaminophen (NORCO/VICODIN) 5-325 MG tablet Take 1 tablet by mouth every 6 (six) hours as needed. 06/09/20   [provider]  insulin detemir (LEVEMIR) 100 unit/ml SOLN Inject 0.2 mLs (20 Units total) into the skin 3 (three) times daily. 05/12/20   Samuella Cota, MD  Levonorgestrel (LILETTA) 19.5 MCG/DAY IUD IUD 1 each by Intrauterine route continuous. Placed in 2019    [provider]  methocarbamol (ROBAXIN) 500 MG tablet Take 1 tablet (500 mg total) by mouth every 8 (eight) hours as needed for muscle spasms. 05/02/20   Orpah Greek, MD  ondansetron (ZOFRAN ODT) 8 MG disintegrating tablet Take 1 tablet (8 mg total) by mouth every 8 (eight) hours as needed for nausea. 08/08/20   Varney Biles, MD  oxyCODONE-acetaminophen (PERCOCET/ROXICET)  5-325 MG tablet Take 1 tablet by mouth every 6 (six) hours as needed. 06/01/20   [provider]  potassium chloride SA (KLOR-CON) 20 MEQ tablet Take 1 tablet (20 mEq total) by mouth daily. 09/29/20   Jacqlyn Larsen, PA-C    Allergies Cetirizine & related, Toradol [ketorolac tromethamine], Flagyl [metronidazole], Contrast media [iodinated diagnostic agents], and Nsaids   REVIEW OF SYSTEMS  Negative except as noted here or in the History of Present Illness.   PHYSICAL EXAMINATION  Initial Vital Signs Blood pressure 108/87, pulse (!) 126, temperature 98.9 F (37.2 C), temperature source Oral, resp. rate 20, height '5\' 7"'  (1.702 m), weight 64.4 kg, SpO2 98 %.  Examination General: Well-developed, well-nourished female in no acute distress; appearance consistent with age of record HENT: normocephalic; atraumatic Eyes: pupils equal, round and reactive to light; extraocular muscles intact Neck: supple Heart: regular rate and rhythm; tachycardia Lungs: clear to auscultation bilaterally Abdomen: soft; mildly distended; epigastric and left upper quadrant tenderness; bowel sounds present Extremities: No deformity; full range of motion; pulses normal Neurologic: Awake, alert and oriented; motor function intact in all extremities and symmetric; no facial droop Skin: Warm and dry Psychiatric: Normal mood and affect   RESULTS  Summary of this visit's results, reviewed and interpreted by myself:   EKG Interpretation  Date/Time:    Ventricular Rate:    PR Interval:    QRS Duration:   QT Interval:    QTC Calculation:   R Axis:     Text Interpretation:        Laboratory Studies: Results for orders placed or performed during the hospital encounter of 11/04/20 (from the past 24 hour(s))  CBG monitoring, ED     Status: Abnormal   Collection Time: 11/04/20 12:10 AM  Result Value Ref Range   Glucose-Capillary >600 (HH) 70 - 99 mg/dL  Lipase, blood     Status: None   Collection  Time: 11/04/20 12:19 AM  Result Value Ref Range   Lipase 31 11 - 51 U/L  Comprehensive metabolic panel     Status: Abnormal   Collection Time: 11/04/20 12:19 AM  Result Value Ref Range   Sodium 127 (L) 135 - 145 mmol/L   Potassium 3.6 3.5 - 5.1 mmol/L   Chloride 91 (L) 98 - 111 mmol/L   CO2 26 22 - 32 mmol/L   Glucose, Bld 633 (HH) 70 - 99 mg/dL   BUN 8 6 - 20 mg/dL   Creatinine, Ser 0.63 0.44 - 1.00 mg/dL   Calcium 9.0 8.9 - 10.3 mg/dL   Total Protein 7.6 6.5 - 8.1 g/dL   Albumin 3.7 3.5 - 5.0 g/dL   AST 12 (L)  15 - 41 U/L   ALT 10 0 - 44 U/L   Alkaline Phosphatase 68 38 - 126 U/L   Total Bilirubin 0.8 0.3 - 1.2 mg/dL   GFR, Estimated >60 >60 mL/min   Anion gap 10 5 - 15  CBC     Status: None   Collection Time: 11/04/20 12:19 AM  Result Value Ref Range   WBC 4.8 4.0 - 10.5 K/uL   RBC 4.70 3.87 - 5.11 MIL/uL   Hemoglobin 13.2 12.0 - 15.0 g/dL   HCT 40.1 36.0 - 46.0 %   MCV 85.3 80.0 - 100.0 fL   MCH 28.1 26.0 - 34.0 pg   MCHC 32.9 30.0 - 36.0 g/dL   RDW 13.4 11.5 - 15.5 %   Platelets 329 150 - 400 K/uL   nRBC 0.0 0.0 - 0.2 %  Urinalysis, Routine w reflex microscopic Urine, Clean Catch     Status: Abnormal   Collection Time: 11/04/20 12:19 AM  Result Value Ref Range   Color, Urine STRAW (A) YELLOW   APPearance CLEAR CLEAR   Specific Gravity, Urine 1.005 1.005 - 1.030   pH 5.0 5.0 - 8.0   Glucose, UA >=500 (A) NEGATIVE mg/dL   Hgb urine dipstick NEGATIVE NEGATIVE   Bilirubin Urine NEGATIVE NEGATIVE   Ketones, ur 5 (A) NEGATIVE mg/dL   Protein, ur NEGATIVE NEGATIVE mg/dL   Nitrite NEGATIVE NEGATIVE   Leukocytes,Ua NEGATIVE NEGATIVE  Pregnancy, urine     Status: None   Collection Time: 11/04/20 12:19 AM  Result Value Ref Range   Preg Test, Ur NEGATIVE NEGATIVE  Urinalysis, Microscopic (reflex)     Status: Abnormal   Collection Time: 11/04/20 12:19 AM  Result Value Ref Range   RBC / HPF NONE SEEN 0 - 5 RBC/hpf   WBC, UA 0-5 0 - 5 WBC/hpf   Bacteria, UA RARE (A)  NONE SEEN   Squamous Epithelial / LPF 0-5 0 - 5   Budding Yeast PRESENT   CBG monitoring, ED     Status: Abnormal   Collection Time: 11/04/20  1:16 AM  Result Value Ref Range   Glucose-Capillary 521 (HH) 70 - 99 mg/dL   Comment 1 Notify RN   CBG monitoring, ED     Status: Abnormal   Collection Time: 11/04/20  1:52 AM  Result Value Ref Range   Glucose-Capillary 344 (H) 70 - 99 mg/dL  I-Stat venous blood gas, ED     Status: Abnormal   Collection Time: 11/04/20  1:59 AM  Result Value Ref Range   pH, Ven 7.317 7.250 - 7.430   pCO2, Ven 60.7 (H) 44.0 - 60.0 mmHg   pO2, Ven 19.0 (LL) 32.0 - 45.0 mmHg   Bicarbonate 31.1 (H) 20.0 - 28.0 mmol/L   TCO2 33 (H) 22 - 32 mmol/L   O2 Saturation 26.0 %   Acid-Base Excess 3.0 (H) 0.0 - 2.0 mmol/L   Sodium 137 135 - 145 mmol/L   Potassium 3.1 (L) 3.5 - 5.1 mmol/L   Calcium, Ion 1.29 1.15 - 1.40 mmol/L   HCT 38.0 36.0 - 46.0 %   Hemoglobin 12.9 12.0 - 15.0 g/dL   Patient temperature 98.3 F    Sample type VENOUS    Comment NOTIFIED PHYSICIAN   I-Stat venous blood gas, ED     Status: Abnormal   Collection Time: 11/04/20  2:05 AM  Result Value Ref Range   pH, Ven 7.312 7.250 - 7.430   pCO2, Ven 61.2 (H) 44.0 -  60.0 mmHg   pO2, Ven 19.0 (LL) 32.0 - 45.0 mmHg   Bicarbonate 31.0 (H) 20.0 - 28.0 mmol/L   TCO2 33 (H) 22 - 32 mmol/L   O2 Saturation 25.0 %   Acid-Base Excess 3.0 (H) 0.0 - 2.0 mmol/L   Sodium 137 135 - 145 mmol/L   Potassium 3.1 (L) 3.5 - 5.1 mmol/L   Calcium, Ion 1.29 1.15 - 1.40 mmol/L   HCT 38.0 36.0 - 46.0 %   Hemoglobin 12.9 12.0 - 15.0 g/dL   Patient temperature 98.3 F    Sample type VENOUS    Comment NOTIFIED PHYSICIAN   CBG monitoring, ED     Status: Abnormal   Collection Time: 11/04/20  2:49 AM  Result Value Ref Range   Glucose-Capillary 201 (H) 70 - 99 mg/dL  CBG monitoring, ED     Status: Abnormal   Collection Time: 11/04/20  3:53 AM  Result Value Ref Range   Glucose-Capillary 120 (H) 70 - 99 mg/dL   Imaging  Studies: No results found.  ED COURSE and MDM  Nursing notes, initial and subsequent vitals signs, including pulse oximetry, reviewed and interpreted by myself.  Vitals:   11/04/20 0003 11/04/20 0130 11/04/20 0254 11/04/20 0300  BP:  103/69 106/69 107/64  Pulse:  99 89 86  Resp:  '19 17 17  ' Temp:   98.7 F (37.1 C)   TempSrc:   Oral   SpO2: 98% 97% 99% 98%  Weight:      Height:       Medications  lactated ringers infusion ( Intravenous New Bag/Given 11/04/20 0138)  dextrose 50 % solution 0-50 mL (has no administration in time range)  ondansetron (ZOFRAN) injection 4 mg (4 mg Intravenous Given 11/04/20 0025)  fentaNYL (SUBLIMAZE) injection 50 mcg (50 mcg Intravenous Given 11/04/20 0402)  sodium chloride 0.9 % bolus 1,000 mL (0 mLs Intravenous Stopped 11/04/20 0135)  fentaNYL (SUBLIMAZE) injection 100 mcg (100 mcg Intravenous Given 11/04/20 0111)  diphenhydrAMINE (BENADRYL) injection 25 mg (25 mg Intravenous Given 11/04/20 0147)  pantoprazole (PROTONIX) injection 40 mg (40 mg Intravenous Given 11/04/20 0246)  sucralfate (CARAFATE) 1 GM/10ML suspension 1 g (1 g Oral Given 11/04/20 0247)  lactated ringers bolus 1,000 mL (0 mLs Intravenous Stopped 11/04/20 0358)   4:02 AM Sugar down to 120 after IV fluid bolus and insulin infusion.  The patient states she has had abdominal pain and other related problems (constipation) and is followed by gastroenterologist at [facility].  She was scheduled for colonoscopy previously but her prep was inadequate.  She was advised she needs to follow-up regarding her suspected steatorrhea.  Her abdominal pain is improved after IV Protonix and oral Carafate.  This could represent gastritis or early ulcer.  We will start her on home PPI and Carafate.   PROCEDURES  Procedures   ED DIAGNOSES     ICD-10-CM   1. Left upper quadrant abdominal pain  R10.12   2. Hyperglycemia  R73.9        Valisa Karpel, Jenny Reichmann, MD 11/04/20 [phone]

## 2020-11-04 NOTE — ED Notes (Signed)
Pt hand off to [place], Therapist, sports. Updated that pt needs repeat BGL in 1 hr.

## 2020-11-04 NOTE — ED Notes (Signed)
Pt verbalized understanding of f/u info, gave scripts, left ED to get ride w family member. Vitals WNL.

## 2020-11-04 NOTE — ED Notes (Signed)
Date and time results received: 11/04/20 0107 (use smartphrase ".now" to insert current time)  Test: Glucose  Critical Value: 633  Name of Provider Notified: Dr Florina Ou   Orders Received? Or Actions Taken?: None

## 2020-11-15 ENCOUNTER — Emergency Department (HOSPITAL_BASED_OUTPATIENT_CLINIC_OR_DEPARTMENT_OTHER)
Admission: EM | Admit: 2020-11-15 | Discharge: 2020-11-15 | Disposition: A | Discharge status: Home / Self Care | Payer: Medicaid Other | Attending: Emergency Medicine | Admitting: Emergency Medicine

## 2020-11-15 ENCOUNTER — Other Ambulatory Visit: Payer: Self-pay

## 2020-11-15 ENCOUNTER — Encounter (HOSPITAL_BASED_OUTPATIENT_CLINIC_OR_DEPARTMENT_OTHER): Payer: Self-pay | Admitting: *Deleted

## 2020-11-15 DIAGNOSIS — R1110 Vomiting, unspecified: Secondary | ICD-10-CM | POA: Diagnosis not present

## 2020-11-15 DIAGNOSIS — Z5321 Procedure and treatment not carried out due to patient leaving prior to being seen by health care provider: Secondary | ICD-10-CM | POA: Diagnosis not present

## 2020-11-15 DIAGNOSIS — R197 Diarrhea, unspecified: Secondary | ICD-10-CM | POA: Insufficient documentation

## 2020-11-15 DIAGNOSIS — R109 Unspecified abdominal pain: Secondary | ICD-10-CM | POA: Diagnosis present

## 2020-11-15 LAB — URINALYSIS, ROUTINE W REFLEX MICROSCOPIC
Bilirubin Urine: NEGATIVE
Glucose, UA: >=500 mg/dL — AB
Hgb urine dipstick: NEGATIVE
Ketones, ur: NEGATIVE mg/dL
Leukocytes,Ua: NEGATIVE
Nitrite: NEGATIVE
Protein, ur: NEGATIVE mg/dL
Specific Gravity, Urine: 1.01 (ref 1.005–1.030)
pH: 6 (ref 5.0–8.0)

## 2020-11-15 LAB — LIPASE, BLOOD: Lipase: 30 U/L (ref 11–51)

## 2020-11-15 LAB — CBC
HCT: 43.9 % (ref 36.0–46.0)
Hemoglobin: 14.4 g/dL (ref 12.0–15.0)
MCH: 27.7 pg (ref 26.0–34.0)
MCHC: 32.8 g/dL (ref 30.0–36.0)
MCV: 84.6 fL (ref 80.0–100.0)
Platelets: 368 10*3/uL (ref 150–400)
RBC: 5.19 MIL/uL — ABNORMAL HIGH (ref 3.87–5.11)
RDW: 13.2 % (ref 11.5–15.5)
WBC: 6 10*3/uL (ref 4.0–10.5)
nRBC: 0 % (ref 0.0–0.2)

## 2020-11-15 LAB — COMPREHENSIVE METABOLIC PANEL
ALT: 13 U/L (ref 0–44)
AST: 11 U/L — ABNORMAL LOW (ref 15–41)
Albumin: 4.2 g/dL (ref 3.5–5.0)
Alkaline Phosphatase: 87 U/L (ref 38–126)
Anion gap: 11 (ref 5–15)
BUN: 9 mg/dL (ref 6–20)
CO2: 28 mmol/L (ref 22–32)
Calcium: 9.5 mg/dL (ref 8.9–10.3)
Chloride: 89 mmol/L — ABNORMAL LOW (ref 98–111)
Creatinine, Ser: 0.76 mg/dL (ref 0.44–1.00)
GFR, Estimated: >60 mL/min (ref >60)
Glucose, Bld: 587 mg/dL (ref 70–99)
Potassium: 3.7 mmol/L (ref 3.5–5.1)
Sodium: 128 mmol/L — ABNORMAL LOW (ref 135–145)
Total Bilirubin: 0.4 mg/dL (ref 0.3–1.2)
Total Protein: 8.4 g/dL — ABNORMAL HIGH (ref 6.5–8.1)

## 2020-11-15 LAB — URINALYSIS, MICROSCOPIC (REFLEX)

## 2020-11-15 LAB — PREGNANCY, URINE: Preg Test, Ur: NEGATIVE

## 2020-11-15 MED ORDER — ACETAMINOPHEN 325 MG PO TABS
650.0000 mg | ORAL_TABLET | Freq: Once | ORAL | Status: AC
  Administered 2020-11-15: 650 mg via ORAL
  Filled 2020-11-15: qty 2

## 2020-11-15 NOTE — ED Triage Notes (Signed)
Abdominal pain. She has a hx of pancreatitis. Diarrhea and vomiting x 4 days.

## 2020-12-14 ENCOUNTER — Other Ambulatory Visit: Payer: Self-pay

## 2020-12-14 DIAGNOSIS — R112 Nausea with vomiting, unspecified: Secondary | ICD-10-CM | POA: Insufficient documentation

## 2020-12-14 DIAGNOSIS — Z794 Long term (current) use of insulin: Secondary | ICD-10-CM | POA: Insufficient documentation

## 2020-12-14 DIAGNOSIS — E1165 Type 2 diabetes mellitus with hyperglycemia: Secondary | ICD-10-CM | POA: Diagnosis not present

## 2020-12-14 DIAGNOSIS — F17210 Nicotine dependence, cigarettes, uncomplicated: Secondary | ICD-10-CM | POA: Diagnosis not present

## 2020-12-14 DIAGNOSIS — R1012 Left upper quadrant pain: Secondary | ICD-10-CM | POA: Diagnosis present

## 2020-12-14 DIAGNOSIS — R197 Diarrhea, unspecified: Secondary | ICD-10-CM | POA: Diagnosis not present

## 2020-12-14 NOTE — ED Triage Notes (Signed)
Pt reports severe abd pain, N/V/D for two days; pt reports hx of DM and "intestinal problems"; pain indicated in LLQ

## 2020-12-15 ENCOUNTER — Encounter (HOSPITAL_BASED_OUTPATIENT_CLINIC_OR_DEPARTMENT_OTHER): Payer: Self-pay | Admitting: Emergency Medicine

## 2020-12-15 ENCOUNTER — Emergency Department (HOSPITAL_BASED_OUTPATIENT_CLINIC_OR_DEPARTMENT_OTHER)
Admission: EM | Admit: 2020-12-15 | Discharge: 2020-12-15 | Disposition: A | Discharge status: Home / Self Care | Payer: Medicaid Other | Attending: Emergency Medicine | Admitting: Emergency Medicine

## 2020-12-15 DIAGNOSIS — R1012 Left upper quadrant pain: Secondary | ICD-10-CM

## 2020-12-15 DIAGNOSIS — R197 Diarrhea, unspecified: Secondary | ICD-10-CM

## 2020-12-15 DIAGNOSIS — R112 Nausea with vomiting, unspecified: Secondary | ICD-10-CM

## 2020-12-15 HISTORY — DX: Cocaine abuse, uncomplicated: F14.10

## 2020-12-15 LAB — COMPREHENSIVE METABOLIC PANEL
ALT: 9 U/L (ref 0–44)
AST: 9 U/L — ABNORMAL LOW (ref 15–41)
Albumin: 3.5 g/dL (ref 3.5–5.0)
Alkaline Phosphatase: 72 U/L (ref 38–126)
Anion gap: 9 (ref 5–15)
BUN: 7 mg/dL (ref 6–20)
CO2: 26 mmol/L (ref 22–32)
Calcium: 8.8 mg/dL — ABNORMAL LOW (ref 8.9–10.3)
Chloride: 99 mmol/L (ref 98–111)
Creatinine, Ser: 0.51 mg/dL (ref 0.44–1.00)
GFR, Estimated: >60 mL/min (ref >60)
Glucose, Bld: 288 mg/dL — ABNORMAL HIGH (ref 70–99)
Potassium: 2.8 mmol/L — ABNORMAL LOW (ref 3.5–5.1)
Sodium: 134 mmol/L — ABNORMAL LOW (ref 135–145)
Total Bilirubin: 0.8 mg/dL (ref 0.3–1.2)
Total Protein: 7.2 g/dL (ref 6.5–8.1)

## 2020-12-15 LAB — CBC
HCT: 39.4 % (ref 36.0–46.0)
Hemoglobin: 13.3 g/dL (ref 12.0–15.0)
MCH: 28 pg (ref 26.0–34.0)
MCHC: 33.8 g/dL (ref 30.0–36.0)
MCV: 82.9 fL (ref 80.0–100.0)
Platelets: 359 10*3/uL (ref 150–400)
RBC: 4.75 MIL/uL (ref 3.87–5.11)
RDW: 13.1 % (ref 11.5–15.5)
WBC: 5.1 10*3/uL (ref 4.0–10.5)
nRBC: 0 % (ref 0.0–0.2)

## 2020-12-15 LAB — URINALYSIS, ROUTINE W REFLEX MICROSCOPIC
Bilirubin Urine: NEGATIVE
Glucose, UA: >=500 mg/dL — AB
Hgb urine dipstick: NEGATIVE
Ketones, ur: NEGATIVE mg/dL
Leukocytes,Ua: NEGATIVE
Nitrite: NEGATIVE
Protein, ur: NEGATIVE mg/dL
Specific Gravity, Urine: 1.02 (ref 1.005–1.030)
pH: 6.5 (ref 5.0–8.0)

## 2020-12-15 LAB — URINALYSIS, MICROSCOPIC (REFLEX): WBC, UA: NONE SEEN WBC/hpf (ref 0–5)

## 2020-12-15 LAB — CBG MONITORING, ED: Glucose-Capillary: 267 mg/dL — ABNORMAL HIGH (ref 70–99)

## 2020-12-15 LAB — PREGNANCY, URINE: Preg Test, Ur: NEGATIVE

## 2020-12-15 LAB — LIPASE, BLOOD: Lipase: 31 U/L (ref 11–51)

## 2020-12-15 MED ORDER — SODIUM CHLORIDE 0.9 % IV BOLUS (SEPSIS)
1000.0000 mL | Freq: Once | INTRAVENOUS | Status: AC
  Administered 2020-12-15: 1000 mL via INTRAVENOUS

## 2020-12-15 MED ORDER — HYOSCYAMINE SULFATE SL 0.125 MG SL SUBL: 1.0000 | SUBLINGUAL_TABLET | Freq: Four times a day (QID) | SUBLINGUAL | 0 refills | Status: DC | PRN

## 2020-12-15 MED ORDER — ALUM & MAG HYDROXIDE-SIMETH 200-200-20 MG/5ML PO SUSP
30.0000 mL | Freq: Once | ORAL | Status: AC
  Administered 2020-12-15: 30 mL via ORAL
  Filled 2020-12-15: qty 30

## 2020-12-15 MED ORDER — HYOSCYAMINE SULFATE 0.125 MG SL SUBL
0.1250 mg | SUBLINGUAL_TABLET | Freq: Once | SUBLINGUAL | Status: AC
  Administered 2020-12-15: 0.125 mg via SUBLINGUAL
  Filled 2020-12-15: qty 1

## 2020-12-15 MED ORDER — POTASSIUM CHLORIDE 10 MEQ/100ML IV SOLN
10.0000 meq | Freq: Once | INTRAVENOUS | Status: AC
  Administered 2020-12-15: 10 meq via INTRAVENOUS
  Filled 2020-12-15: qty 100

## 2020-12-15 MED ORDER — SODIUM CHLORIDE 0.9 % IV SOLN
1000.0000 mL | INTRAVENOUS | Status: DC
  Administered 2020-12-15: 1000 mL via INTRAVENOUS

## 2020-12-15 MED ORDER — PROMETHAZINE HCL 25 MG PO TABS: 25.0000 mg | ORAL_TABLET | Freq: Four times a day (QID) | ORAL | 0 refills | Status: DC | PRN

## 2020-12-15 MED ORDER — PROCHLORPERAZINE EDISYLATE 10 MG/2ML IJ SOLN
10.0000 mg | Freq: Once | INTRAMUSCULAR | Status: AC
  Administered 2020-12-15: 10 mg via INTRAVENOUS
  Filled 2020-12-15: qty 2

## 2020-12-15 MED ORDER — DIPHENHYDRAMINE HCL 50 MG/ML IJ SOLN
12.5000 mg | Freq: Once | INTRAMUSCULAR | Status: AC
  Administered 2020-12-15: 12.5 mg via INTRAVENOUS
  Filled 2020-12-15: qty 1

## 2020-12-15 MED ORDER — METOCLOPRAMIDE HCL 5 MG/ML IJ SOLN
10.0000 mg | Freq: Once | INTRAMUSCULAR | Status: AC
  Administered 2020-12-15: 10 mg via INTRAVENOUS
  Filled 2020-12-15: qty 2

## 2020-12-15 NOTE — ED Provider Notes (Signed)
[facility] EMERGENCY DEPARTMENT Provider Note  CSN: [id number] Arrival date & time: 12/14/20 2348  Chief Complaint(s) Abdominal Pain  HPI Jodi Mcgee is a 35 y.o. female with a past medical history listed below including diabetes and IBS who presents to the emergency department with 2 days of nausea, nonbloody nonbilious emesis and diarrhea.  She reports that her sister died 1 week ago and has been receiving food from friends and neighbors.   Abdominal pain is cramping in nature in the left upper quadrant.  Exacerbated with emesis.  No alleviating factors.  Patient has tried taking her home Zofran without relief.  Patient also reports watery diarrhea nonbloody.  Denies any other physical complaints.  HPI  Past Medical History Past Medical History:  Diagnosis Date  . Anemia   . Anxiety   . Blood transfusion without reported diagnosis    both CS  . Cocaine abuse ([place])   . Diabetes mellitus without complication ([place])   . GERD (gastroesophageal reflux disease)    has resolved  . IBS (irritable bowel syndrome)   . Ovarian cyst   . Peritonitis, acute generalized ([place])   . Pulmonary embolism ([place])   . SBO (small bowel obstruction) ([place])   . Sepsis [facility])    Patient Active Problem List   Diagnosis Date Noted  . Controlled type 2 diabetes mellitus with hyperglycemia ([place]) 05/10/2020  . Partial small bowel obstruction ([place]) 05/09/2020  . Bartholin's gland abscess 02/21/2019  . DKA (diabetic ketoacidoses) 02/20/2019  . Blood per rectum 02/20/2019  . Hyponatremia 09/26/2018  . Chronic hypertension 01/03/2018  . IUD contraception 01/03/2018  . Pulmonary embolism during puerperium 12/04/2017  . Pulmonary edema 11/30/2017  . Pelvic adhesive disease 10/22/2017  . Nasal septal perforation 10/17/2017  . History of cocaine abuse ([place]) 10/17/2017  . Epistaxis 10/17/2017  . 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 Obesity (BMI 30-39.9) 09/25/2017  . Type 2  diabetes mellitus, without long-term current use of insulin ([place]) 08/23/2017  . Chronic pain syndrome 08/23/2017   Home Medication(s) Prior to Admission medications   Medication Sig Start Date End Date Taking? Authorizing Provider  Hyoscyamine Sulfate SL (LEVSIN/SL) 0.125 MG SUBL Place 1 each under the tongue 4 (four) times daily as needed for up to 5 days. 12/15/20 12/20/20 Yes Garek Schuneman, Grayce Sessions, MD  promethazine (PHENERGAN) 25 MG tablet Take 1 tablet (25 mg total) by mouth every 6 (six) hours as needed for nausea or vomiting. 12/15/20  Yes Heaton Sarin, Grayce Sessions, MD  blood glucose meter kit and supplies KIT Dispense based on patient and insurance preference. Use up to four times daily as directed. (FOR ICD-9 250.00, 250.01). 02/21/19   Oretha Milch D, MD  dicyclomine (BENTYL) 20 MG tablet Take 20 mg by mouth 2 (two) times daily. 05/27/20   [provider]  HUMULIN R 100 UNIT/ML injection Inject 0-20 Units into the skin 3 (three) times daily before meals. Per sliding scale 03/24/20   [provider]  HYDROcodone-acetaminophen (NORCO/VICODIN) 5-325 MG tablet Take 1 tablet by mouth every 6 (six) hours as needed. 06/09/20   [provider]  insulin detemir (LEVEMIR) 100 unit/ml SOLN Inject 0.2 mLs (20 Units total) into the skin 3 (three) times daily. 05/12/20   Samuella Cota, MD  Levonorgestrel (LILETTA) 19.5 MCG/DAY IUD IUD 1 each by Intrauterine route continuous. Placed in 2019    [provider]  methocarbamol (ROBAXIN) 500 MG tablet Take 1 tablet (500 mg total) by mouth every  8 (eight) hours as needed for muscle spasms. 05/02/20   Orpah Greek, MD  omeprazole (PRILOSEC) 40 MG capsule Take 1 capsule (40 mg total) by mouth daily. 11/04/20   Molpus, John, MD  ondansetron (ZOFRAN ODT) 8 MG disintegrating tablet Take 1 tablet (8 mg total) by mouth every 8 (eight) hours as needed for nausea. 08/08/20   Varney Biles, MD  oxyCODONE-acetaminophen  (PERCOCET/ROXICET) 5-325 MG tablet Take 1 tablet by mouth every 6 (six) hours as needed. 06/01/20   [provider]  potassium chloride SA (KLOR-CON) 20 MEQ tablet Take 1 tablet (20 mEq total) by mouth daily. 09/29/20   Jacqlyn Larsen, PA-C  sucralfate (CARAFATE) 1 g tablet Take 1 tablet (1 g total) by mouth 4 (four) times daily -  with meals and at bedtime. 11/04/20   Molpus, John, MD                                                                                                                                    Past Surgical History Past Surgical History:  Procedure Laterality Date  . APPENDECTOMY     ruptured   . BUNIONECTOMY    . CESAREAN SECTION    . CESAREAN SECTION Bilateral 11/20/2017   Procedure: CESAREAN SECTION;  Surgeon: Sloan Leiter, MD;  Location: [place];  Service: Obstetrics;  Laterality: Bilateral;  . OVARIAN CYST DRAINAGE    . SMALL BOWEL REPAIR     Family History Family History  Problem Relation Age of Onset  . Hypertension Mother   . Anemia Mother   . Diabetes Father     Social History Social History   Tobacco Use  . Smoking status: Light Tobacco Smoker    Packs/day: 0.50    Types: Cigarettes  . Smokeless tobacco: Never Used  . Tobacco comment: social use with tobacco  Vaping Use  . Vaping Use: Some days  Substance Use Topics  . Alcohol use: Yes    Comment: 3 times a month  . Drug use: Not Currently   Allergies Hydrocodone, Cetirizine & related, Toradol [ketorolac tromethamine], Flagyl [metronidazole], Contrast media [iodinated diagnostic agents], and Nsaids  Review of Systems Review of Systems All other systems are reviewed and are negative for acute change except as noted in the HPI  Physical Exam Vital Signs  I have reviewed the triage vital signs BP (!) 144/99 (BP Location: Left Arm)   Pulse 97   Temp 98.7 F (37.1 C) (Oral)   Resp 16   Ht $R'5\' 7"'mr$  (1.702 m)   Wt 70.8 kg   SpO2 99%   BMI 24.43 kg/m   Physical  Exam Vitals reviewed.  Constitutional:      General: She is not in acute distress.    Appearance: She is well-developed and well-nourished. She is not diaphoretic.  HENT:     Head: Normocephalic and atraumatic.     Right Ear: External  ear normal.     Left Ear: External ear normal.     Nose: Nose normal.  Eyes:     General: No scleral icterus.    Extraocular Movements: EOM normal.     Conjunctiva/sclera: Conjunctivae normal.  Neck:     Trachea: Phonation normal.  Cardiovascular:     Rate and Rhythm: Normal rate and regular rhythm.  Pulmonary:     Effort: Pulmonary effort is normal. No respiratory distress.     Breath sounds: No stridor.  Abdominal:     General: There is no distension.     Tenderness: There is abdominal tenderness (mild discomfort) in the left upper quadrant. There is no guarding or rebound.  Musculoskeletal:        General: No edema. Normal range of motion.     Cervical back: Normal range of motion.  Neurological:     Mental Status: She is alert and oriented to person, place, and time.  Psychiatric:        Mood and Affect: Mood and affect normal.        Behavior: Behavior normal.     ED Results and Treatments Labs (all labs ordered are listed, but only abnormal results are displayed) Labs Reviewed  COMPREHENSIVE METABOLIC PANEL - Abnormal; Notable for the following components:      Result Value   Sodium 134 (*)    Potassium 2.8 (*)    Glucose, Bld 288 (*)    Calcium 8.8 (*)    AST 9 (*)    All other components within normal limits  URINALYSIS, ROUTINE W REFLEX MICROSCOPIC - Abnormal; Notable for the following components:   Glucose, UA >=500 (*)    All other components within normal limits  URINALYSIS, MICROSCOPIC (REFLEX) - Abnormal; Notable for the following components:   Bacteria, UA FEW (*)    All other components within normal limits  CBG MONITORING, ED - Abnormal; Notable for the following components:   Glucose-Capillary 267 (*)    All other  components within normal limits  LIPASE, BLOOD  CBC  PREGNANCY, URINE                                                                                                                         EKG  EKG Interpretation  Date/Time:    Ventricular Rate:    PR Interval:    QRS Duration:   QT Interval:    QTC Calculation:   R Axis:     Text Interpretation:        Radiology No results found.  Pertinent labs & imaging results that were available during my care of the patient were reviewed by me and considered in my medical decision making (see chart for details).  Medications Ordered in ED Medications  sodium chloride 0.9 % bolus 1,000 mL (0 mLs Intravenous Stopped 12/15/20 0518)    Followed by  0.9 %  sodium chloride infusion (0 mLs Intravenous Stopped 12/15/20 0725)  metoCLOPramide (REGLAN) injection  10 mg (10 mg Intravenous Given 12/15/20 0346)  diphenhydrAMINE (BENADRYL) injection 12.5 mg (12.5 mg Intravenous Given 12/15/20 0347)  hyoscyamine (LEVSIN SL) SL tablet 0.125 mg (0.125 mg Sublingual Given 12/15/20 0349)  alum & mag hydroxide-simeth (MAALOX/MYLANTA) 200-200-20 MG/5ML suspension 30 mL (30 mLs Oral Given 12/15/20 0348)  potassium chloride 10 mEq in 100 mL IVPB (0 mEq Intravenous Stopped 12/15/20 0617)  hyoscyamine (LEVSIN SL) SL tablet 0.125 mg (0.125 mg Sublingual Given 12/15/20 0615)  prochlorperazine (COMPAZINE) injection 10 mg (10 mg Intravenous Given 12/15/20 0615)                                                                                                                                    Procedures Procedures  (including critical care time)  Medical Decision Making / ED Course I have reviewed the nursing notes for this encounter and the patient's prior records (if available in EHR or on provided paperwork).   Jodi Mcgee was evaluated in Emergency Department on 12/15/2020 for the symptoms described in the history of present illness. She was evaluated in the  context of the global COVID-19 pandemic, which necessitated consideration that the patient might be at risk for infection with the SARS-CoV-2 virus that causes COVID-19. Institutional protocols and algorithms that pertain to the evaluation of patients at risk for COVID-19 are in a state of rapid change based on information released by regulatory bodies including the CDC and federal and state organizations. These policies and algorithms were followed during the patient's care in the ED.  35 y.o. female presents with vomiting, diarrhea, and abd discomfort for 2 days.  possible suspicious food intake.  decrased oral tolerance. Rest of history as above.  Patient appears well, not in distress, and with no signs of toxicity or dehydration. Abdomen benign.  Rest of the exam as above  Labs grossly reassuring without leukocytosis or anemia.  Patient does have mild hypokalemia likely secondary to her diarrhea.  Patient also reports that she is supposed to be on potassium supplements but she does not take them.  She also has hyperglycemia without evidence of DKA.  UA without evidence of infection.  No biliary obstruction or pancreatitis.  Most consistent with viral gastroenteritis vs food poisoning.   Doubt appendicitis, diverticulitis, severe colitis, dysentery.    Able to tolerate oral intake in the ED.  Discussed symptomatic treatment with the patient and they will follow closely with their PCP.       Final Clinical Impression(s) / ED Diagnoses Final diagnoses:  Left upper quadrant abdominal pain  Nausea vomiting and diarrhea    The patient appears reasonably screened and/or stabilized for discharge and I doubt any other medical condition or other [facility] requiring further screening, evaluation, or treatment in the ED at this time prior to discharge. Safe for discharge with strict return precautions.  Disposition: Discharge  Condition: Good  I have discussed the results, Dx and Tx  plan with the  patient/family who expressed understanding and agree(s) with the plan. Discharge instructions discussed at length. The patient/family was given strict return precautions who verbalized understanding of the instructions. No further questions at time of discharge.    ED Discharge Orders         Ordered    promethazine (PHENERGAN) 25 MG tablet  Every 6 hours PRN        12/15/20 0705    Hyoscyamine Sulfate SL (LEVSIN/SL) 0.125 MG SUBL  4 times daily PRN        12/15/20 2659         Follow Up: [facility] [address] [place] [id number] [phone]  Call  to schedule an appointment for close follow up, if symptoms do not improve or  worsen in 3-5 days     This chart was dictated using voice recognition software.  Despite best efforts to proofread,  errors can occur which can change the documentation meaning.   Fatima Blank, MD 12/15/20 0730

## 2021-03-02 ENCOUNTER — Encounter (HOSPITAL_BASED_OUTPATIENT_CLINIC_OR_DEPARTMENT_OTHER): Payer: Self-pay | Admitting: *Deleted

## 2021-03-02 ENCOUNTER — Emergency Department (HOSPITAL_BASED_OUTPATIENT_CLINIC_OR_DEPARTMENT_OTHER)
Admission: EM | Admit: 2021-03-02 | Discharge: 2021-03-02 | Disposition: A | Discharge status: Home / Self Care | Payer: Medicaid Other | Attending: Emergency Medicine | Admitting: Emergency Medicine

## 2021-03-02 ENCOUNTER — Other Ambulatory Visit: Payer: Self-pay

## 2021-03-02 DIAGNOSIS — R739 Hyperglycemia, unspecified: Secondary | ICD-10-CM

## 2021-03-02 DIAGNOSIS — Z794 Long term (current) use of insulin: Secondary | ICD-10-CM | POA: Insufficient documentation

## 2021-03-02 DIAGNOSIS — Z7984 Long term (current) use of oral hypoglycemic drugs: Secondary | ICD-10-CM | POA: Insufficient documentation

## 2021-03-02 DIAGNOSIS — E1165 Type 2 diabetes mellitus with hyperglycemia: Secondary | ICD-10-CM | POA: Diagnosis not present

## 2021-03-02 DIAGNOSIS — F17210 Nicotine dependence, cigarettes, uncomplicated: Secondary | ICD-10-CM | POA: Insufficient documentation

## 2021-03-02 DIAGNOSIS — R1084 Generalized abdominal pain: Secondary | ICD-10-CM | POA: Diagnosis not present

## 2021-03-02 DIAGNOSIS — Z8522 Personal history of malignant neoplasm of nasal cavities, middle ear, and accessory sinuses: Secondary | ICD-10-CM | POA: Insufficient documentation

## 2021-03-02 DIAGNOSIS — R112 Nausea with vomiting, unspecified: Secondary | ICD-10-CM | POA: Insufficient documentation

## 2021-03-02 DIAGNOSIS — K219 Gastro-esophageal reflux disease without esophagitis: Secondary | ICD-10-CM | POA: Diagnosis not present

## 2021-03-02 DIAGNOSIS — R197 Diarrhea, unspecified: Secondary | ICD-10-CM | POA: Insufficient documentation

## 2021-03-02 DIAGNOSIS — R1012 Left upper quadrant pain: Secondary | ICD-10-CM | POA: Diagnosis present

## 2021-03-02 DIAGNOSIS — R000 Tachycardia, unspecified: Secondary | ICD-10-CM | POA: Diagnosis not present

## 2021-03-02 LAB — COMPREHENSIVE METABOLIC PANEL
ALT: 12 U/L (ref 0–44)
AST: 14 U/L — ABNORMAL LOW (ref 15–41)
Albumin: 3.2 g/dL — ABNORMAL LOW (ref 3.5–5.0)
Alkaline Phosphatase: 77 U/L (ref 38–126)
Anion gap: 12 (ref 5–15)
BUN: 10 mg/dL (ref 6–20)
CO2: 26 mmol/L (ref 22–32)
Calcium: 8.7 mg/dL — ABNORMAL LOW (ref 8.9–10.3)
Chloride: 90 mmol/L — ABNORMAL LOW (ref 98–111)
Creatinine, Ser: 0.66 mg/dL (ref 0.44–1.00)
GFR, Estimated: >60 mL/min (ref >60)
Glucose, Bld: 612 mg/dL (ref 70–99)
Potassium: 3 mmol/L — ABNORMAL LOW (ref 3.5–5.1)
Sodium: 128 mmol/L — ABNORMAL LOW (ref 135–145)
Total Bilirubin: 0.7 mg/dL (ref 0.3–1.2)
Total Protein: 7.1 g/dL (ref 6.5–8.1)

## 2021-03-02 LAB — CBC WITH DIFFERENTIAL/PLATELET
Abs Immature Granulocytes: 0.02 10*3/uL (ref 0.00–0.07)
Basophils Absolute: 0 10*3/uL (ref 0.0–0.1)
Basophils Relative: 0 %
Eosinophils Absolute: 0 10*3/uL (ref 0.0–0.5)
Eosinophils Relative: 1 %
HCT: 40.6 % (ref 36.0–46.0)
Hemoglobin: 13.6 g/dL (ref 12.0–15.0)
Immature Granulocytes: 0 %
Lymphocytes Relative: 21 %
Lymphs Abs: 1.5 10*3/uL (ref 0.7–4.0)
MCH: 27.8 pg (ref 26.0–34.0)
MCHC: 33.5 g/dL (ref 30.0–36.0)
MCV: 83 fL (ref 80.0–100.0)
Monocytes Absolute: 0.5 10*3/uL (ref 0.1–1.0)
Monocytes Relative: 6 %
Neutro Abs: 5.3 10*3/uL (ref 1.7–7.7)
Neutrophils Relative %: 72 %
Platelets: 317 10*3/uL (ref 150–400)
RBC: 4.89 MIL/uL (ref 3.87–5.11)
RDW: 14.3 % (ref 11.5–15.5)
WBC: 7.3 10*3/uL (ref 4.0–10.5)
nRBC: 0 % (ref 0.0–0.2)

## 2021-03-02 LAB — CBG MONITORING, ED
Glucose-Capillary: 569 mg/dL (ref 70–99)
Glucose-Capillary: 331 mg/dL — ABNORMAL HIGH (ref 70–99)

## 2021-03-02 LAB — BETA-HYDROXYBUTYRIC ACID: Beta-Hydroxybutyric Acid: 0.17 mmol/L (ref 0.05–0.27)

## 2021-03-02 LAB — LIPASE, BLOOD: Lipase: 27 U/L (ref 11–51)

## 2021-03-02 MED ORDER — INSULIN GLARGINE 100 UNIT/ML ~~LOC~~ SOLN
20.0000 [IU] | Freq: Once | SUBCUTANEOUS | Status: AC
  Administered 2021-03-02: 20 [IU] via SUBCUTANEOUS
  Filled 2021-03-02: qty 1

## 2021-03-02 MED ORDER — POTASSIUM CHLORIDE CRYS ER 20 MEQ PO TBCR
40.0000 meq | EXTENDED_RELEASE_TABLET | Freq: Once | ORAL | Status: AC
  Administered 2021-03-02: 40 meq via ORAL
  Filled 2021-03-02: qty 2

## 2021-03-02 MED ORDER — INSULIN REGULAR HUMAN 100 UNIT/ML IJ SOLN
10.0000 [IU] | Freq: Once | INTRAMUSCULAR | Status: DC
  Filled 2021-03-02: qty 3

## 2021-03-02 MED ORDER — INSULIN ASPART PROT & ASPART (70-30 MIX) 100 UNIT/ML ~~LOC~~ SUSP
10.0000 [IU] | Freq: Once | SUBCUTANEOUS | Status: AC
  Administered 2021-03-02: 10 [IU] via SUBCUTANEOUS
  Filled 2021-03-02: qty 10

## 2021-03-02 MED ORDER — LACTATED RINGERS IV BOLUS
1000.0000 mL | Freq: Once | INTRAVENOUS | Status: AC
  Administered 2021-03-02: 1000 mL via INTRAVENOUS

## 2021-03-02 MED ORDER — INSULIN DETEMIR 100 UNIT/ML FLEXPEN
20.0000 [IU] | Freq: Three times a day (TID) | SUBCUTANEOUS | 0 refills | Status: DC
Start: 2021-03-02 — End: 2021-03-02

## 2021-03-02 MED ORDER — POTASSIUM CHLORIDE 10 MEQ/100ML IV SOLN
10.0000 meq | INTRAVENOUS | Status: AC
  Administered 2021-03-02 (×2): 10 meq via INTRAVENOUS
  Filled 2021-03-02 (×2): qty 100

## 2021-03-02 MED ORDER — OXYCODONE HCL 5 MG PO TABS
5.0000 mg | ORAL_TABLET | Freq: Once | ORAL | Status: AC
  Administered 2021-03-02: 5 mg via ORAL
  Filled 2021-03-02: qty 1

## 2021-03-02 MED ORDER — INSULIN DETEMIR 100 UNIT/ML FLEXPEN: 20.0000 [IU] | Freq: Three times a day (TID) | SUBCUTANEOUS | 0 refills | Status: DC

## 2021-03-02 NOTE — ED Notes (Signed)
RN Deja informed of cbg reading of 569

## 2021-03-02 NOTE — ED Provider Notes (Signed)
[facility] EMERGENCY DEPARTMENT Provider Note   CSN: [id number] Arrival date & time: 03/02/21  1636     History Chief Complaint  Patient presents with  . Abdominal Pain    Jodi Mcgee is a 35 y.o. female.  Jodi Mcgee is a 35 year old woman who presents ED with 3 to 4 days of worsening abdominal pain.  She has a previous medical history notable for type 2 diabetes with multiple ED visits for hyperglycemia.  She reports that she has taken her diabetes medication roughly 8 days out of the past 2 weeks.  She has not taken any of her diabetes medication today.  She notes that she has been having slowly worsening abdominal pain for roughly the past 4 days which has been getting worse.  She has had N/V/NB emesis in addition to some loose stools.  She has diffuse abdominal pain which is slightly worse in the left upper quadrant.  When she checked her blood sugar at home, the glucometer simply read high she then took some of her insulin and her glucometer continue to read high.  This must of been yesterday because she reports she has not taken any insulin today.  She continued to feel worse today and has had increasing abdominal pain with some radiation to her left middle back and decided to present to the ED for further evaluation.  She reports that she has been depressed recently because her twin sister passed away about 1.5 months ago.  She has been having trouble feeling the motivation to take her medication regularly.  Her diabetic medication includes insulin, metformin and Jardiance.  She has not taken any medication at all today.        Past Medical History:  Diagnosis Date  . Anemia   . Anxiety   . Blood transfusion without reported diagnosis    both CS  . Cocaine abuse ([place])   . Diabetes mellitus without complication ([place])   . GERD (gastroesophageal reflux disease)    has resolved  . IBS (irritable bowel syndrome)   . Ovarian cyst   . Peritonitis, acute  generalized ([place])   . Pulmonary embolism ([place])   . SBO (small bowel obstruction) ([place])   . Sepsis [facility])     Patient Active Problem List   Diagnosis Date Noted  . Controlled type 2 diabetes mellitus with hyperglycemia ([place]) 05/10/2020  . Partial small bowel obstruction ([place]) 05/09/2020  . Bartholin's gland abscess 02/21/2019  . DKA (diabetic ketoacidoses) 02/20/2019  . Blood per rectum 02/20/2019  . Hyponatremia 09/26/2018  . Chronic hypertension 01/03/2018  . IUD contraception 01/03/2018  . Pulmonary embolism during puerperium 12/04/2017  . Pulmonary edema 11/30/2017  . Pelvic adhesive disease 10/22/2017  . Nasal septal perforation 10/17/2017  . History of cocaine abuse ([place]) 10/17/2017  . Epistaxis 10/17/2017  . 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 Obesity (BMI 30-39.9) 09/25/2017  . Type 2 diabetes mellitus, without long-term current use of insulin ([place]) 08/23/2017  . Chronic pain syndrome 08/23/2017    Past Surgical History:  Procedure Laterality Date  . APPENDECTOMY     ruptured   . BUNIONECTOMY    . CESAREAN SECTION    . CESAREAN SECTION Bilateral 11/20/2017   Procedure: CESAREAN SECTION;  Surgeon: Sloan Leiter, MD;  Location: [place];  Service: Obstetrics;  Laterality: Bilateral;  . OVARIAN CYST DRAINAGE    . SMALL BOWEL REPAIR       OB History    [place]  3   Para  3   Term  1   Preterm  2   AB  0   Living  3     SAB  0   IAB  0   Ectopic  0   Multiple  0   Live Births  3           Family History  Problem Relation Age of Onset  . Hypertension Mother   . Anemia Mother   . Diabetes Father     Social History   Tobacco Use  . Smoking status: Light Tobacco Smoker    Packs/day: 0.50    Types: Cigarettes  . Smokeless tobacco: Never Used  . Tobacco comment: social use with tobacco  Vaping Use  . Vaping Use: Some days  Substance Use Topics  . Alcohol use: Yes    Comment: 3 times a month  . Drug use: Not  Currently    Home Medications Prior to Admission medications   Medication Sig Start Date End Date Taking? Authorizing Provider  blood glucose meter kit and supplies KIT Dispense based on patient and insurance preference. Use up to four times daily as directed. (FOR ICD-9 250.00, 250.01). 02/21/19   Oretha Milch D, MD  dicyclomine (BENTYL) 20 MG tablet Take 20 mg by mouth 2 (two) times daily. 05/27/20   [provider]  HUMULIN R 100 UNIT/ML injection Inject 0-20 Units into the skin 3 (three) times daily before meals. Per sliding scale 03/24/20   [provider]  HYDROcodone-acetaminophen (NORCO/VICODIN) 5-325 MG tablet Take 1 tablet by mouth every 6 (six) hours as needed. 06/09/20   [provider]  Hyoscyamine Sulfate SL (LEVSIN/SL) 0.125 MG SUBL Place 1 each under the tongue 4 (four) times daily as needed for up to 5 days. 12/15/20 12/20/20  Fatima Blank, MD  insulin detemir (LEVEMIR) 100 unit/ml SOLN Inject 0.2 mLs (20 Units total) into the skin 3 (three) times daily. 05/12/20   Samuella Cota, MD  Levonorgestrel (LILETTA) 19.5 MCG/DAY IUD IUD 1 each by Intrauterine route continuous. Placed in 2019    [provider]  methocarbamol (ROBAXIN) 500 MG tablet Take 1 tablet (500 mg total) by mouth every 8 (eight) hours as needed for muscle spasms. 05/02/20   Orpah Greek, MD  omeprazole (PRILOSEC) 40 MG capsule Take 1 capsule (40 mg total) by mouth daily. 11/04/20   Molpus, John, MD  ondansetron (ZOFRAN ODT) 8 MG disintegrating tablet Take 1 tablet (8 mg total) by mouth every 8 (eight) hours as needed for nausea. 08/08/20   Varney Biles, MD  oxyCODONE-acetaminophen (PERCOCET/ROXICET) 5-325 MG tablet Take 1 tablet by mouth every 6 (six) hours as needed. 06/01/20   [provider]  potassium chloride SA (KLOR-CON) 20 MEQ tablet Take 1 tablet (20 mEq total) by mouth daily. 09/29/20   Jacqlyn Larsen, PA-C  promethazine (PHENERGAN) 25 MG tablet  Take 1 tablet (25 mg total) by mouth every 6 (six) hours as needed for nausea or vomiting. 12/15/20   Cardama, Grayce Sessions, MD  sucralfate (CARAFATE) 1 g tablet Take 1 tablet (1 g total) by mouth 4 (four) times daily -  with meals and at bedtime. 11/04/20   Molpus, Jenny Reichmann, MD    Allergies    Hydrocodone, Cetirizine & related, Toradol [ketorolac tromethamine], Flagyl [metronidazole], Contrast media [iodinated diagnostic agents], and Nsaids  Review of Systems   Review of Systems  Constitutional: Negative for fever.  HENT: Negative for sore throat.  Respiratory: Negative for cough and shortness of breath.   Gastrointestinal: Positive for diarrhea, nausea and vomiting.  Endocrine: Positive for polydipsia and polyuria.  Skin: Negative for pallor and rash.    Physical Exam Updated Vital Signs BP 121/82 (BP Location: Left Arm)   Pulse (!) 117   Temp 98.7 F (37.1 C) (Oral)   Resp 18   Ht _0  (1.702 m)   SpO2 97%   BMI 24.43 kg/m   Physical Exam Constitutional:      Appearance: She is ill-appearing.  HENT:     Head: Normocephalic.     Mouth/Throat:     Comments: Dry mucous membranes. Cardiovascular:     Rate and Rhythm: Regular rhythm. Tachycardia present.     Heart sounds: Normal heart sounds.  Pulmonary:     Effort: Pulmonary effort is normal.     Breath sounds: Normal breath sounds.  Abdominal:     General: Bowel sounds are normal.     Palpations: Abdomen is soft.     Tenderness: There is generalized abdominal tenderness and tenderness in the left upper quadrant.     Comments: Mildly distended.  Soft.  Diffusely tender to superficial palpation.  Most tender in the left upper quadrant.  Skin:    General: Skin is warm and dry.  Neurological:     General: No focal deficit present.     Mental Status: She is alert.     ED Results / Procedures / Treatments   Labs (all labs ordered are listed, but only abnormal results are displayed) Labs Reviewed  BASIC METABOLIC  PANEL  LIPASE, BLOOD  CBC WITH DIFFERENTIAL/PLATELET  PREGNANCY, URINE  URINALYSIS, ROUTINE W REFLEX MICROSCOPIC  RAPID URINE DRUG SCREEN, HOSP PERFORMED  CBG MONITORING, ED    EKG None  Radiology No results found.  Procedures Procedures   Medications Ordered in ED Medications  lactated ringers bolus 1,000 mL (has no administration in time range)  oxyCODONE (Oxy IR/ROXICODONE) immediate release tablet 5 mg (has no administration in time range)    ED Course  I have reviewed the triage vital signs and the nursing notes.  Pertinent labs & imaging results that were available during my care of the patient were reviewed by me and considered in my medical decision making (see chart for details).    MDM Rules/Calculators/A&P                          This 35 year old woman is presenting the ED with 3-4 days of increasing abdominal pain.  She is history notable for type 2 diabetes with multiple presentations to the ED for hyperglycemia.  She notes she is not been taking her medication recently and only has been taking her medication maybe 8 days out of the past 2 weeks.  At this time, very high suspicion for hyperglycemia.  We will move forward with labs and rule out DKA.  We will also rule out ectopic pregnancy, pancreatitis and gallbladder pathology.  Labs so far indicate hyperglycemia without an anion gap.  Low suspicion for DKA at this time.  She also has a significantly low potassium at 3.0.  We will replete her potassium and then provide short acting insulin to lower her blood sugar safely to the point where she can go home.   She continues to note left side pain.  We will continue to assess his provide additional fluids and insulin.  If her pain persists, will consider additional imaging  if although, at this time, I think hyperglycemia is a more likely cause of her abdominal discomfort.  She continues to express concern about her left-sided flank pain.  Overall, sounds very  similar to her previous presentation of hyperglycemia in January.  At the January visit, CT abdomen did not show any evidence of renal abnormality or acute intra abdominal findings.  We will follow-up with a UA, if there are no concerning findings on her UA, we will not move forward with CT abdomen at this time.    She has received fluids and insulin and potassium so far.  She reports that she does not have any long-acting insulin at home.  We will make sure that she receives long-acting insulin before discharging.  Repeat CBG showed improved blood glucose 330.  On reassessment, she reported feeling improved although not totally back to her baseline.  She reported that she felt more awake and animated.  She was feeling ready to return home to care for her children.  We discussed the importance of maintaining a consistent insulin regimen for improved blood sugar control.  Her home long-acting insulin was refilled.  She was encouraged to follow-up with her primary care doctor within the week.  Final Clinical Impression(s) / ED Diagnoses Final diagnoses:  None    Rx / DC Orders ED Discharge Orders    None       Matilde Haymaker, MD 03/02/21 2142    Margette Fast, MD 03/07/21 [phone]

## 2021-03-02 NOTE — Discharge Instructions (Addendum)
I am sorry that you have been feeling so unwell lately.  I think all of your symptoms are related to your elevated blood sugar.  Sure that has been difficult to consistently take all of your medications.  I think it would be very important to consistently take your insulin in order to improve your symptoms and avoid further complications.  I do not think additional abdominal imaging would be helpful at this time based on the information that we have.  I have sent in a refill of your long-acting insulin.  I recommend that you follow-up with your primary care doctor within the week for improved blood sugar control.

## 2021-03-02 NOTE — ED Triage Notes (Signed)
C/o abd pain n/v/d x 4 days

## 2021-03-20 ENCOUNTER — Other Ambulatory Visit: Payer: Self-pay

## 2021-03-20 ENCOUNTER — Emergency Department (HOSPITAL_COMMUNITY)
Admission: EM | Admit: 2021-03-20 | Discharge: 2021-03-20 | Disposition: A | Discharge status: Home / Self Care | Payer: Medicaid Other | Source: Home / Self Care | Attending: Emergency Medicine | Admitting: Emergency Medicine

## 2021-03-20 ENCOUNTER — Emergency Department (HOSPITAL_COMMUNITY): Payer: Medicaid Other

## 2021-03-20 ENCOUNTER — Encounter (HOSPITAL_COMMUNITY): Payer: Self-pay | Admitting: Emergency Medicine

## 2021-03-20 DIAGNOSIS — R0602 Shortness of breath: Secondary | ICD-10-CM

## 2021-03-20 DIAGNOSIS — R079 Chest pain, unspecified: Secondary | ICD-10-CM

## 2021-03-20 DIAGNOSIS — F17210 Nicotine dependence, cigarettes, uncomplicated: Secondary | ICD-10-CM | POA: Insufficient documentation

## 2021-03-20 DIAGNOSIS — R0789 Other chest pain: Secondary | ICD-10-CM | POA: Insufficient documentation

## 2021-03-20 DIAGNOSIS — E1110 Type 2 diabetes mellitus with ketoacidosis without coma: Secondary | ICD-10-CM | POA: Insufficient documentation

## 2021-03-20 DIAGNOSIS — Z8522 Personal history of malignant neoplasm of nasal cavities, middle ear, and accessory sinuses: Secondary | ICD-10-CM | POA: Insufficient documentation

## 2021-03-20 DIAGNOSIS — Z794 Long term (current) use of insulin: Secondary | ICD-10-CM | POA: Insufficient documentation

## 2021-03-20 DIAGNOSIS — E1165 Type 2 diabetes mellitus with hyperglycemia: Secondary | ICD-10-CM | POA: Insufficient documentation

## 2021-03-20 DIAGNOSIS — I10 Essential (primary) hypertension: Secondary | ICD-10-CM | POA: Insufficient documentation

## 2021-03-20 DIAGNOSIS — R112 Nausea with vomiting, unspecified: Secondary | ICD-10-CM

## 2021-03-20 DIAGNOSIS — R4182 Altered mental status, unspecified: Secondary | ICD-10-CM | POA: Insufficient documentation

## 2021-03-20 DIAGNOSIS — E876 Hypokalemia: Secondary | ICD-10-CM

## 2021-03-20 DIAGNOSIS — R739 Hyperglycemia, unspecified: Secondary | ICD-10-CM

## 2021-03-20 LAB — CBC WITH DIFFERENTIAL/PLATELET
Abs Immature Granulocytes: 0.02 10*3/uL (ref 0.00–0.07)
Basophils Absolute: 0 10*3/uL (ref 0.0–0.1)
Basophils Relative: 0 %
Eosinophils Absolute: 0 10*3/uL (ref 0.0–0.5)
Eosinophils Relative: 0 %
HCT: 43.9 % (ref 36.0–46.0)
Hemoglobin: 14.4 g/dL (ref 12.0–15.0)
Immature Granulocytes: 0 %
Lymphocytes Relative: 9 %
Lymphs Abs: 0.6 10*3/uL — ABNORMAL LOW (ref 0.7–4.0)
MCH: 27.4 pg (ref 26.0–34.0)
MCHC: 32.8 g/dL (ref 30.0–36.0)
MCV: 83.6 fL (ref 80.0–100.0)
Monocytes Absolute: 0.3 10*3/uL (ref 0.1–1.0)
Monocytes Relative: 5 %
Neutro Abs: 5.8 10*3/uL (ref 1.7–7.7)
Neutrophils Relative %: 86 %
Platelets: 307 10*3/uL (ref 150–400)
RBC: 5.25 MIL/uL — ABNORMAL HIGH (ref 3.87–5.11)
RDW: 14.2 % (ref 11.5–15.5)
WBC: 6.8 10*3/uL (ref 4.0–10.5)
nRBC: 0 % (ref 0.0–0.2)

## 2021-03-20 LAB — URINALYSIS, ROUTINE W REFLEX MICROSCOPIC
Bilirubin Urine: NEGATIVE
Glucose, UA: >=500 mg/dL — AB
Ketones, ur: 80 mg/dL — AB
Nitrite: NEGATIVE
Protein, ur: 100 mg/dL — AB
Specific Gravity, Urine: 1.027 (ref 1.005–1.030)
WBC, UA: >50 WBC/hpf — ABNORMAL HIGH (ref 0–5)
pH: 6 (ref 5.0–8.0)

## 2021-03-20 LAB — BASIC METABOLIC PANEL
Anion gap: 14 (ref 5–15)
BUN: 10 mg/dL (ref 6–20)
CO2: 25 mmol/L (ref 22–32)
Calcium: 9.3 mg/dL (ref 8.9–10.3)
Chloride: 97 mmol/L — ABNORMAL LOW (ref 98–111)
Creatinine, Ser: 0.55 mg/dL (ref 0.44–1.00)
Glucose, Bld: 369 mg/dL — ABNORMAL HIGH (ref 70–99)
Potassium: 2.6 mmol/L — CL (ref 3.5–5.1)
Sodium: 136 mmol/L (ref 135–145)

## 2021-03-20 LAB — TROPONIN I (HIGH SENSITIVITY): Troponin I (High Sensitivity): 4 ng/L (ref <18)

## 2021-03-20 LAB — I-STAT BETA HCG BLOOD, ED (MC, WL, AP ONLY): I-stat hCG, quantitative: <5 m[IU]/mL (ref <5)

## 2021-03-20 LAB — CBG MONITORING, ED: Glucose-Capillary: 318 mg/dL — ABNORMAL HIGH (ref 70–99)

## 2021-03-20 MED ORDER — MAGNESIUM OXIDE -MG SUPPLEMENT 400 (240 MG) MG PO TABS
800.0000 mg | ORAL_TABLET | Freq: Once | ORAL | Status: AC
  Administered 2021-03-20: 800 mg via ORAL
  Filled 2021-03-20: qty 2

## 2021-03-20 MED ORDER — ONDANSETRON HCL 4 MG/2ML IJ SOLN
4.0000 mg | Freq: Once | INTRAMUSCULAR | Status: AC
  Administered 2021-03-20: 4 mg via INTRAVENOUS
  Filled 2021-03-20: qty 2

## 2021-03-20 MED ORDER — POTASSIUM CHLORIDE 10 MEQ/100ML IV SOLN
10.0000 meq | INTRAVENOUS | Status: AC
  Administered 2021-03-20 (×3): 10 meq via INTRAVENOUS
  Filled 2021-03-20 (×3): qty 100

## 2021-03-20 MED ORDER — POTASSIUM CHLORIDE CRYS ER 20 MEQ PO TBCR
40.0000 meq | EXTENDED_RELEASE_TABLET | ORAL | Status: AC
  Administered 2021-03-20 (×2): 40 meq via ORAL
  Filled 2021-03-20 (×2): qty 2

## 2021-03-20 MED ORDER — SODIUM CHLORIDE 0.9 % IV BOLUS
1000.0000 mL | Freq: Once | INTRAVENOUS | Status: AC
  Administered 2021-03-20: 1000 mL via INTRAVENOUS

## 2021-03-20 MED ORDER — PROMETHAZINE HCL 25 MG RE SUPP: 25.0000 mg | Freq: Four times a day (QID) | RECTAL | 0 refills | Status: DC | PRN

## 2021-03-20 MED ORDER — ONDANSETRON 4 MG PO TBDP: ORAL_TABLET | ORAL | 0 refills | Status: DC

## 2021-03-20 NOTE — ED Notes (Signed)
X-ray at bedside

## 2021-03-20 NOTE — ED Triage Notes (Addendum)
Pt BIB [facility] for AMS. CBG 428. Was tachypneic with EMS. Patient has already adjusted her bed when she though no one was looking. 400 mL NS en route. 20g IV in L forearm.

## 2021-03-20 NOTE — ED Notes (Signed)
Pt unable to provide a urine specimen at this time

## 2021-03-20 NOTE — ED Notes (Signed)
Pt instructed to provide a urine specimen when ready.

## 2021-03-20 NOTE — ED Notes (Signed)
Critical potassium called.  Result 2.6.

## 2021-03-20 NOTE — Discharge Instructions (Addendum)
Follow up with your family doc. Return for worsening symptoms.  °

## 2021-03-20 NOTE — ED Provider Notes (Signed)
[facility] DEPT Provider Note   CSN: [id number] Arrival date & time: 03/20/21  0006     History Chief Complaint  Patient presents with  . Chest Pain    Jodi Mcgee is a 35 y.o. female.  35 yo F with a chief complaint of chest pain.  This been going on just today.  Has had some shortness of breath with it as well.  She is not sure what makes it better or worse.  Denies cough congestion or fever denies trauma denies abdominal pain.  Pain is left-sided and pinpoint just underneath the left breast.  Denies radiation.  The history is provided by the patient and the EMS personnel.  Altered Mental Status Associated symptoms: no fever, no headaches, no nausea, no palpitations and no vomiting   Illness Severity:  Moderate Onset quality:  Gradual Duration:  12 hours Timing:  Constant Progression:  Unchanged Chronicity:  New Associated symptoms: chest pain and shortness of breath   Associated symptoms: no congestion, no fever, no headaches, no myalgias, no nausea, no rhinorrhea, no vomiting and no wheezing        Past Medical History:  Diagnosis Date  . Anemia   . Anxiety   . Blood transfusion without reported diagnosis    both CS  . Cocaine abuse ([place])   . Diabetes mellitus without complication ([place])   . GERD (gastroesophageal reflux disease)    has resolved  . IBS (irritable bowel syndrome)   . Ovarian cyst   . Peritonitis, acute generalized ([place])   . Pulmonary embolism ([place])   . SBO (small bowel obstruction) ([place])   . Sepsis [facility])     Patient Active Problem List   Diagnosis Date Noted  . Controlled type 2 diabetes mellitus with hyperglycemia ([place]) 05/10/2020  . Partial small bowel obstruction ([place]) 05/09/2020  . Bartholin's gland abscess 02/21/2019  . DKA (diabetic ketoacidoses) 02/20/2019  . Blood per rectum 02/20/2019  . Hyponatremia 09/26/2018  . Chronic hypertension 01/03/2018  . IUD contraception 01/03/2018  . Pulmonary  embolism during puerperium 12/04/2017  . Pulmonary edema 11/30/2017  . Pelvic adhesive disease 10/22/2017  . Nasal septal perforation 10/17/2017  . History of cocaine abuse ([place]) 10/17/2017  . Epistaxis 10/17/2017  . 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 Obesity (BMI 30-39.9) 09/25/2017  . Type 2 diabetes mellitus, without long-term current use of insulin ([place]) 08/23/2017  . Chronic pain syndrome 08/23/2017    Past Surgical History:  Procedure Laterality Date  . APPENDECTOMY     ruptured   . BUNIONECTOMY    . CESAREAN SECTION    . CESAREAN SECTION Bilateral 11/20/2017   Procedure: CESAREAN SECTION;  Surgeon: Sloan Leiter, MD;  Location: [facility];  Service: Obstetrics;  Laterality: Bilateral;  . OVARIAN CYST DRAINAGE    . SMALL BOWEL REPAIR       OB History    Gravida  3   Para  3   Term  1   Preterm  2   AB  0   Living  3     SAB  0   IAB  0   Ectopic  0   Multiple  0   Live Births  3           Family History  Problem Relation Age of Onset  . Hypertension Mother   . Anemia Mother   . Diabetes Father     Social History   Tobacco Use  .  Smoking status: Light Tobacco Smoker    Packs/day: 0.50    Types: Cigarettes  . Smokeless tobacco: Never Used  . Tobacco comment: social use with tobacco  Vaping Use  . Vaping Use: Some days  Substance Use Topics  . Alcohol use: Yes    Comment: 3 times a month  . Drug use: Not Currently    Home Medications Prior to Admission medications   Medication Sig Start Date End Date Taking? Authorizing Provider  ondansetron (ZOFRAN ODT) 4 MG disintegrating tablet 54m ODT q4 hours prn nausea/vomit 03/20/21  Yes FDeno Etienne DO  promethazine (PHENERGAN) 25 MG suppository Place 1 suppository (25 mg total) rectally every 6 (six) hours as needed for nausea or vomiting. 03/20/21  Yes FDeno Etienne DO  blood glucose meter kit and supplies KIT Dispense based on patient and insurance preference. Use up to four  times daily as directed. (FOR ICD-9 250.00, 250.01). 02/21/19   NOretha MilchD, MD  dicyclomine (BENTYL) 20 MG tablet Take 20 mg by mouth 2 (two) times daily. 05/27/20   [provider]  HUMULIN R 100 UNIT/ML injection Inject 0-20 Units into the skin 3 (three) times daily before meals. Per sliding scale 03/24/20   [provider]  HYDROcodone-acetaminophen (NORCO/VICODIN) 5-325 MG tablet Take 1 tablet by mouth every 6 (six) hours as needed. 06/09/20   [provider]  Hyoscyamine Sulfate SL (LEVSIN/SL) 0.125 MG SUBL Place 1 each under the tongue 4 (four) times daily as needed for up to 5 days. 12/15/20 12/20/20  CFatima Blank MD  insulin detemir (LEVEMIR) 100 unit/ml SOLN Inject 20 Units into the skin 3 (three) times daily. 03/02/21   FMatilde Haymaker MD  Levonorgestrel (LILETTA) 19.5 MCG/DAY IUD IUD 1 each by Intrauterine route continuous. Placed in 2019    [provider]  methocarbamol (ROBAXIN) 500 MG tablet Take 1 tablet (500 mg total) by mouth every 8 (eight) hours as needed for muscle spasms. 05/02/20   POrpah Greek MD  omeprazole (PRILOSEC) 40 MG capsule Take 1 capsule (40 mg total) by mouth daily. 11/04/20   Molpus, John, MD  oxyCODONE-acetaminophen (PERCOCET/ROXICET) 5-325 MG tablet Take 1 tablet by mouth every 6 (six) hours as needed. 06/01/20   [provider]  potassium chloride SA (KLOR-CON) 20 MEQ tablet Take 1 tablet (20 mEq total) by mouth daily. 09/29/20   FJacqlyn Larsen PA-C  sucralfate (CARAFATE) 1 g tablet Take 1 tablet (1 g total) by mouth 4 (four) times daily -  with meals and at bedtime. 11/04/20   Molpus, John, MD    Allergies    Hydrocodone, Cetirizine & related, Toradol [ketorolac tromethamine], Flagyl [metronidazole], Contrast media [iodinated diagnostic agents], and Nsaids  Review of Systems   Review of Systems  Constitutional: Negative for chills and fever.  HENT: Negative for congestion and rhinorrhea.   Eyes:  Negative for redness and visual disturbance.  Respiratory: Positive for shortness of breath. Negative for wheezing.   Cardiovascular: Positive for chest pain. Negative for palpitations.  Gastrointestinal: Negative for nausea and vomiting.  Genitourinary: Negative for dysuria and urgency.  Musculoskeletal: Negative for arthralgias and myalgias.  Skin: Negative for pallor and wound.  Neurological: Negative for dizziness and headaches.    Physical Exam Updated Vital Signs BP (!) 177/121 (BP Location: Right Arm)   Pulse 99   Temp 99.1 F (37.3 C) (Oral)   Resp (!) 21   Ht '5\' 7"'  (1.702 m)   Wt 70.3 kg   SpO2  97%   BMI 24.28 kg/m   Physical Exam Vitals and nursing note reviewed.  Constitutional:      General: She is not in acute distress.    Appearance: She is well-developed. She is not diaphoretic.  HENT:     Head: Normocephalic and atraumatic.  Eyes:     Pupils: Pupils are equal, round, and reactive to light.  Cardiovascular:     Rate and Rhythm: Normal rate and regular rhythm.     Heart sounds: No murmur heard. No friction rub. No gallop.   Pulmonary:     Effort: Pulmonary effort is normal.     Breath sounds: No wheezing or rales.  Chest:     Chest wall: Tenderness (pinpoint to the left chest wall about the lateral clavicular line rib 7-8) present.  Abdominal:     General: There is no distension.     Palpations: Abdomen is soft.     Tenderness: There is no abdominal tenderness.  Musculoskeletal:        General: No tenderness.     Cervical back: Normal range of motion and neck supple.  Skin:    General: Skin is warm and dry.  Neurological:     Mental Status: She is alert and oriented to person, place, and time.  Psychiatric:        Behavior: Behavior normal.     ED Results / Procedures / Treatments   Labs (all labs ordered are listed, but only abnormal results are displayed) Labs Reviewed  CBC WITH DIFFERENTIAL/PLATELET - Abnormal; Notable for the following  components:      Result Value   RBC 5.25 (*)    Lymphs Abs 0.6 (*)    All other components within normal limits  BASIC METABOLIC PANEL - Abnormal; Notable for the following components:   Potassium 2.6 (*)    Chloride 97 (*)    Glucose, Bld 369 (*)    All other components within normal limits  URINALYSIS, ROUTINE W REFLEX MICROSCOPIC - Abnormal; Notable for the following components:   APPearance CLOUDY (*)    Glucose, UA >=500 (*)    Hgb urine dipstick MODERATE (*)    Ketones, ur 80 (*)    Protein, ur 100 (*)    Leukocytes,Ua LARGE (*)    WBC, UA >50 (*)    Bacteria, UA RARE (*)    All other components within normal limits  CBG MONITORING, ED - Abnormal; Notable for the following components:   Glucose-Capillary 318 (*)    All other components within normal limits  I-STAT BETA HCG BLOOD, ED ([facility], [facility], [facility] ONLY)  TROPONIN I (HIGH SENSITIVITY)    EKG EKG Interpretation  Date/Time:  Sunday Mar 20 2021 00:41:18 EDT Ventricular Rate:  101 PR Interval:  146 QRS Duration: 92 QT Interval:  355 QTC Calculation: 461 R Axis:   67 Text Interpretation: Sinus tachycardia Left atrial enlargement No significant change since last tracing Confirmed by Deno Etienne [phone]) on 03/20/2021 1:09:04 AM   Radiology DG Chest Port 1 View  Result Date: 03/20/2021 CLINICAL DATA:  Chest pain EXAM: PORTABLE CHEST 1 VIEW COMPARISON:  08/22/2019 FINDINGS: Single frontal view of the chest demonstrates an unremarkable cardiac silhouette. No acute airspace disease, effusion, or pneumothorax. No acute bony abnormalities. IMPRESSION: 1. No acute intrathoracic process. Electronically Signed   By: Randa Ngo M.D.   On: 03/20/2021 00:42    Procedures Procedures   Medications Ordered in ED Medications  ondansetron (ZOFRAN) injection 4 mg (4  mg Intravenous Given 03/20/21 0059)  potassium chloride 10 mEq in 100 mL IVPB (10 mEq Intravenous New Bag/Given 03/20/21 0541)  potassium chloride SA (KLOR-CON) CR tablet 40  mEq (40 mEq Oral Given 03/20/21 0435)  magnesium oxide (MAG-OX) tablet 800 mg (800 mg Oral Given 03/20/21 0314)  sodium chloride 0.9 % bolus 1,000 mL (1,000 mLs Intravenous New Bag/Given 03/20/21 0433)    ED Course  I have reviewed the triage vital signs and the nursing notes.  Pertinent labs & imaging results that were available during my care of the patient were reviewed by me and considered in my medical decision making (see chart for details).    MDM Rules/Calculators/A&P                          35 yo F with a chief complaints of chest pain and shortness of breath.  Atypical in nature and reproduced on exam.  Will obtain a laboratory evaluation.  Chest x-ray blood work EKG.  Potassium 2.6 without ecg changes.   Will replenish here.  Some vomiting, improved with antiemetics.  Tolerating PO.   Trop negative, symptoms lasting longer than 6 hours without change will not delta.  Completely atypical of PE>   6:44 AM:  I have discussed the diagnosis/risks/treatment options with the patient and believe the pt to be eligible for discharge home to follow-up with PCP. We also discussed returning to the ED immediately if new or worsening sx occur. We discussed the sx which are most concerning (e.g., sudden worsening pain, fever, inability to tolerate by mouth) that necessitate immediate return. Medications administered to the patient during their visit and any new prescriptions provided to the patient are listed below.  Medications given during this visit Medications  ondansetron (ZOFRAN) injection 4 mg (4 mg Intravenous Given 03/20/21 0059)  potassium chloride 10 mEq in 100 mL IVPB (10 mEq Intravenous New Bag/Given 03/20/21 0541)  potassium chloride SA (KLOR-CON) CR tablet 40 mEq (40 mEq Oral Given 03/20/21 0435)  magnesium oxide (MAG-OX) tablet 800 mg (800 mg Oral Given 03/20/21 0314)  sodium chloride 0.9 % bolus 1,000 mL (1,000 mLs Intravenous New Bag/Given 03/20/21 0433)     The patient appears  reasonably screen and/or stabilized for discharge and I doubt any other medical condition or other [facility] requiring further screening, evaluation, or treatment in the ED at this time prior to discharge.   Final Clinical Impression(s) / ED Diagnoses Final diagnoses:  Nonspecific chest pain  Shortness of breath  Hyperglycemia  Nausea and vomiting in adult    Rx / DC Orders ED Discharge Orders         Ordered    ondansetron (ZOFRAN ODT) 4 MG disintegrating tablet        03/20/21 0642    promethazine (PHENERGAN) 25 MG suppository  Every 6 hours PRN        03/20/21 [place], [place], DO 03/20/21 [id number]

## 2021-03-21 ENCOUNTER — Inpatient Hospital Stay (HOSPITAL_COMMUNITY)
Admission: EM | Admit: 2021-03-21 | Discharge: 2021-03-25 | DRG: 690 | Disposition: A | Discharge status: Home / Self Care | Payer: Medicaid Other | Attending: Internal Medicine | Admitting: Internal Medicine

## 2021-03-21 ENCOUNTER — Emergency Department (HOSPITAL_COMMUNITY): Payer: Medicaid Other

## 2021-03-21 ENCOUNTER — Other Ambulatory Visit: Payer: Self-pay

## 2021-03-21 ENCOUNTER — Encounter (HOSPITAL_COMMUNITY): Payer: Self-pay | Admitting: Emergency Medicine

## 2021-03-21 DIAGNOSIS — E669 Obesity, unspecified: Secondary | ICD-10-CM | POA: Diagnosis present

## 2021-03-21 DIAGNOSIS — N10 Acute tubulo-interstitial nephritis: Secondary | ICD-10-CM | POA: Diagnosis present

## 2021-03-21 DIAGNOSIS — Z886 Allergy status to analgesic agent status: Secondary | ICD-10-CM

## 2021-03-21 DIAGNOSIS — K5900 Constipation, unspecified: Secondary | ICD-10-CM | POA: Diagnosis present

## 2021-03-21 DIAGNOSIS — E1100 Type 2 diabetes mellitus with hyperosmolarity without nonketotic hyperglycemic-hyperosmolar coma (NKHHC): Secondary | ICD-10-CM | POA: Diagnosis not present

## 2021-03-21 DIAGNOSIS — R17 Unspecified jaundice: Secondary | ICD-10-CM | POA: Diagnosis present

## 2021-03-21 DIAGNOSIS — Z91041 Radiographic dye allergy status: Secondary | ICD-10-CM

## 2021-03-21 DIAGNOSIS — Z794 Long term (current) use of insulin: Secondary | ICD-10-CM | POA: Diagnosis not present

## 2021-03-21 DIAGNOSIS — Z9109 Other allergy status, other than to drugs and biological substances: Secondary | ICD-10-CM

## 2021-03-21 DIAGNOSIS — E876 Hypokalemia: Secondary | ICD-10-CM

## 2021-03-21 DIAGNOSIS — Z833 Family history of diabetes mellitus: Secondary | ICD-10-CM

## 2021-03-21 DIAGNOSIS — I10 Essential (primary) hypertension: Secondary | ICD-10-CM | POA: Diagnosis not present

## 2021-03-21 DIAGNOSIS — R319 Hematuria, unspecified: Secondary | ICD-10-CM | POA: Diagnosis not present

## 2021-03-21 DIAGNOSIS — L299 Pruritus, unspecified: Secondary | ICD-10-CM | POA: Diagnosis not present

## 2021-03-21 DIAGNOSIS — Z8249 Family history of ischemic heart disease and other diseases of the circulatory system: Secondary | ICD-10-CM | POA: Diagnosis not present

## 2021-03-21 DIAGNOSIS — B9620 Unspecified Escherichia coli [E. coli] as the cause of diseases classified elsewhere: Secondary | ICD-10-CM | POA: Diagnosis present

## 2021-03-21 DIAGNOSIS — Z975 Presence of (intrauterine) contraceptive device: Secondary | ICD-10-CM

## 2021-03-21 DIAGNOSIS — F17210 Nicotine dependence, cigarettes, uncomplicated: Secondary | ICD-10-CM | POA: Diagnosis present

## 2021-03-21 DIAGNOSIS — Z20822 Contact with and (suspected) exposure to covid-19: Secondary | ICD-10-CM | POA: Diagnosis present

## 2021-03-21 DIAGNOSIS — K219 Gastro-esophageal reflux disease without esophagitis: Secondary | ICD-10-CM

## 2021-03-21 DIAGNOSIS — Z6824 Body mass index (BMI) 24.0-24.9, adult: Secondary | ICD-10-CM

## 2021-03-21 DIAGNOSIS — G894 Chronic pain syndrome: Secondary | ICD-10-CM | POA: Diagnosis present

## 2021-03-21 DIAGNOSIS — Z885 Allergy status to narcotic agent status: Secondary | ICD-10-CM

## 2021-03-21 DIAGNOSIS — Z79899 Other long term (current) drug therapy: Secondary | ICD-10-CM

## 2021-03-21 DIAGNOSIS — E1165 Type 2 diabetes mellitus with hyperglycemia: Secondary | ICD-10-CM | POA: Diagnosis present

## 2021-03-21 DIAGNOSIS — Z881 Allergy status to other antibiotic agents status: Secondary | ICD-10-CM

## 2021-03-21 DIAGNOSIS — R079 Chest pain, unspecified: Secondary | ICD-10-CM | POA: Diagnosis present

## 2021-03-21 DIAGNOSIS — F419 Anxiety disorder, unspecified: Secondary | ICD-10-CM | POA: Diagnosis present

## 2021-03-21 DIAGNOSIS — K589 Irritable bowel syndrome without diarrhea: Secondary | ICD-10-CM | POA: Diagnosis present

## 2021-03-21 DIAGNOSIS — R112 Nausea with vomiting, unspecified: Secondary | ICD-10-CM

## 2021-03-21 DIAGNOSIS — N12 Tubulo-interstitial nephritis, not specified as acute or chronic: Secondary | ICD-10-CM | POA: Diagnosis present

## 2021-03-21 DIAGNOSIS — E119 Type 2 diabetes mellitus without complications: Secondary | ICD-10-CM

## 2021-03-21 DIAGNOSIS — T402X5A Adverse effect of other opioids, initial encounter: Secondary | ICD-10-CM | POA: Diagnosis not present

## 2021-03-21 DIAGNOSIS — Z86711 Personal history of pulmonary embolism: Secondary | ICD-10-CM

## 2021-03-21 LAB — CBC WITH DIFFERENTIAL/PLATELET
Abs Immature Granulocytes: 0.04 10*3/uL (ref 0.00–0.07)
Basophils Absolute: 0 10*3/uL (ref 0.0–0.1)
Basophils Relative: 0 %
Eosinophils Absolute: 0 10*3/uL (ref 0.0–0.5)
Eosinophils Relative: 0 %
HCT: 42.9 % (ref 36.0–46.0)
Hemoglobin: 14.1 g/dL (ref 12.0–15.0)
Immature Granulocytes: 1 %
Lymphocytes Relative: 5 %
Lymphs Abs: 0.4 10*3/uL — ABNORMAL LOW (ref 0.7–4.0)
MCH: 27.7 pg (ref 26.0–34.0)
MCHC: 32.9 g/dL (ref 30.0–36.0)
MCV: 84.3 fL (ref 80.0–100.0)
Monocytes Absolute: 0.8 10*3/uL (ref 0.1–1.0)
Monocytes Relative: 9 %
Neutro Abs: 7.2 10*3/uL (ref 1.7–7.7)
Neutrophils Relative %: 85 %
Platelets: 241 10*3/uL (ref 150–400)
RBC: 5.09 MIL/uL (ref 3.87–5.11)
RDW: 14.5 % (ref 11.5–15.5)
WBC: 8.4 10*3/uL (ref 4.0–10.5)
nRBC: 0 % (ref 0.0–0.2)

## 2021-03-21 LAB — COMPREHENSIVE METABOLIC PANEL
ALT: 12 U/L (ref 0–44)
AST: 25 U/L (ref 15–41)
Albumin: 3.5 g/dL (ref 3.5–5.0)
Alkaline Phosphatase: 91 U/L (ref 38–126)
Anion gap: 12 (ref 5–15)
BUN: 8 mg/dL (ref 6–20)
CO2: 24 mmol/L (ref 22–32)
Calcium: 9.4 mg/dL (ref 8.9–10.3)
Chloride: 98 mmol/L (ref 98–111)
Creatinine, Ser: 0.62 mg/dL (ref 0.44–1.00)
GFR, Estimated: >60 mL/min (ref >60)
Glucose, Bld: 339 mg/dL — ABNORMAL HIGH (ref 70–99)
Potassium: 3.9 mmol/L (ref 3.5–5.1)
Sodium: 134 mmol/L — ABNORMAL LOW (ref 135–145)
Total Bilirubin: 1.7 mg/dL — ABNORMAL HIGH (ref 0.3–1.2)
Total Protein: 8.1 g/dL (ref 6.5–8.1)

## 2021-03-21 LAB — URINALYSIS, ROUTINE W REFLEX MICROSCOPIC
Bilirubin Urine: NEGATIVE
Glucose, UA: >=500 mg/dL — AB
Ketones, ur: 80 mg/dL — AB
Nitrite: POSITIVE — AB
Protein, ur: 100 mg/dL — AB
Specific Gravity, Urine: 1.022 (ref 1.005–1.030)
WBC, UA: >50 WBC/hpf — ABNORMAL HIGH (ref 0–5)
pH: 6 (ref 5.0–8.0)

## 2021-03-21 LAB — RESP PANEL BY RT-PCR (FLU A&B, COVID) ARPGX2
Influenza A by PCR: NEGATIVE
Influenza B by PCR: NEGATIVE
SARS Coronavirus 2 by RT PCR: NEGATIVE

## 2021-03-21 LAB — MAGNESIUM: Magnesium: 1.6 mg/dL — ABNORMAL LOW (ref 1.7–2.4)

## 2021-03-21 LAB — BILIRUBIN, FRACTIONATED(TOT/DIR/INDIR)
Bilirubin, Direct: 0.1 mg/dL (ref 0.0–0.2)
Indirect Bilirubin: 1 mg/dL — ABNORMAL HIGH (ref 0.3–0.9)
Total Bilirubin: 1.1 mg/dL (ref 0.3–1.2)

## 2021-03-21 LAB — POTASSIUM: Potassium: 3.2 mmol/L — ABNORMAL LOW (ref 3.5–5.1)

## 2021-03-21 LAB — I-STAT BETA HCG BLOOD, ED (MC, WL, AP ONLY): I-stat hCG, quantitative: <5 m[IU]/mL (ref <5)

## 2021-03-21 LAB — GLUCOSE, CAPILLARY
Glucose-Capillary: 300 mg/dL — ABNORMAL HIGH (ref 70–99)
Glucose-Capillary: 193 mg/dL — ABNORMAL HIGH (ref 70–99)

## 2021-03-21 MED ORDER — HYDROMORPHONE HCL 1 MG/ML IJ SOLN
0.5000 mg | Freq: Once | INTRAMUSCULAR | Status: AC
Start: 2021-03-21 — End: 2021-03-21
  Administered 2021-03-21: 0.5 mg via INTRAVENOUS
  Filled 2021-03-21: qty 1

## 2021-03-21 MED ORDER — ONDANSETRON HCL 4 MG/2ML IJ SOLN
4.0000 mg | Freq: Four times a day (QID) | INTRAMUSCULAR | Status: DC | PRN
  Administered 2021-03-21 – 2021-03-23 (×4): 4 mg via INTRAVENOUS
  Filled 2021-03-21 (×4): qty 2

## 2021-03-21 MED ORDER — OXYCODONE-ACETAMINOPHEN 5-325 MG PO TABS
1.0000 | ORAL_TABLET | Freq: Once | ORAL | Status: AC
  Administered 2021-03-21: 1 via ORAL
  Filled 2021-03-21: qty 1

## 2021-03-21 MED ORDER — OXYCODONE HCL 5 MG PO TABS
5.0000 mg | ORAL_TABLET | ORAL | Status: DC | PRN
  Administered 2021-03-23 – 2021-03-25 (×7): 5 mg via ORAL
  Filled 2021-03-21 (×7): qty 1

## 2021-03-21 MED ORDER — SODIUM CHLORIDE 0.9 % IV SOLN
25.0000 mg | Freq: Four times a day (QID) | INTRAVENOUS | Status: DC | PRN
  Administered 2021-03-21: 25 mg via INTRAVENOUS
  Filled 2021-03-21: qty 25

## 2021-03-21 MED ORDER — POTASSIUM CHLORIDE CRYS ER 20 MEQ PO TBCR
40.0000 meq | EXTENDED_RELEASE_TABLET | Freq: Two times a day (BID) | ORAL | Status: DC
  Administered 2021-03-21 – 2021-03-22 (×2): 40 meq via ORAL
  Filled 2021-03-21 (×2): qty 2

## 2021-03-21 MED ORDER — SODIUM CHLORIDE 0.9 % IV SOLN
2.0000 g | INTRAVENOUS | Status: DC
  Administered 2021-03-22 – 2021-03-23 (×2): 2 g via INTRAVENOUS
  Filled 2021-03-21 (×2): qty 20

## 2021-03-21 MED ORDER — INSULIN DETEMIR 100 UNIT/ML ~~LOC~~ SOLN
20.0000 [IU] | Freq: Two times a day (BID) | SUBCUTANEOUS | Status: DC
  Administered 2021-03-21 – 2021-03-22 (×2): 20 [IU] via SUBCUTANEOUS
  Filled 2021-03-21 (×3): qty 0.2

## 2021-03-21 MED ORDER — FENTANYL CITRATE (PF) 100 MCG/2ML IJ SOLN
100.0000 ug | Freq: Once | INTRAMUSCULAR | Status: AC
  Administered 2021-03-21: 100 ug via INTRAVENOUS
  Filled 2021-03-21: qty 2

## 2021-03-21 MED ORDER — ONDANSETRON HCL 4 MG/2ML IJ SOLN
4.0000 mg | Freq: Once | INTRAMUSCULAR | Status: AC
  Administered 2021-03-21: 4 mg via INTRAVENOUS
  Filled 2021-03-21: qty 2

## 2021-03-21 MED ORDER — ACETAMINOPHEN 325 MG PO TABS
650.0000 mg | ORAL_TABLET | Freq: Four times a day (QID) | ORAL | Status: DC | PRN
  Administered 2021-03-23: 650 mg via ORAL
  Filled 2021-03-21: qty 2

## 2021-03-21 MED ORDER — SODIUM CHLORIDE 0.9 % IV BOLUS
1000.0000 mL | Freq: Once | INTRAVENOUS | Status: AC
  Administered 2021-03-21: 1000 mL via INTRAVENOUS

## 2021-03-21 MED ORDER — ONDANSETRON 4 MG PO TBDP
4.0000 mg | ORAL_TABLET | Freq: Once | ORAL | Status: AC
  Administered 2021-03-21: 4 mg via ORAL
  Filled 2021-03-21: qty 1

## 2021-03-21 MED ORDER — SODIUM CHLORIDE 0.9 % IV SOLN
1.0000 g | Freq: Once | INTRAVENOUS | Status: AC
  Administered 2021-03-21: 1 g via INTRAVENOUS
  Filled 2021-03-21: qty 10

## 2021-03-21 MED ORDER — SODIUM CHLORIDE 0.9 % IV SOLN: INTRAVENOUS | Status: DC

## 2021-03-21 MED ORDER — MORPHINE SULFATE (PF) 2 MG/ML IV SOLN
2.0000 mg | INTRAVENOUS | Status: DC | PRN
  Administered 2021-03-21 – 2021-03-22 (×6): 2 mg via INTRAVENOUS
  Filled 2021-03-21 (×6): qty 1

## 2021-03-21 MED ORDER — ONDANSETRON HCL 4 MG PO TABS
4.0000 mg | ORAL_TABLET | Freq: Four times a day (QID) | ORAL | Status: DC | PRN
  Administered 2021-03-24: 4 mg via ORAL
  Filled 2021-03-21: qty 1

## 2021-03-21 MED ORDER — AMLODIPINE BESYLATE 5 MG PO TABS
5.0000 mg | ORAL_TABLET | Freq: Every day | ORAL | Status: DC
  Administered 2021-03-21 – 2021-03-25 (×5): 5 mg via ORAL
  Filled 2021-03-21 (×5): qty 1

## 2021-03-21 MED ORDER — ACETAMINOPHEN 650 MG RE SUPP: 650.0000 mg | Freq: Four times a day (QID) | RECTAL | Status: DC | PRN

## 2021-03-21 MED ORDER — INSULIN ASPART 100 UNIT/ML IJ SOLN
0.0000 [IU] | Freq: Every day | INTRAMUSCULAR | Status: DC
  Administered 2021-03-22: 2 [IU] via SUBCUTANEOUS
  Administered 2021-03-23: 3 [IU] via SUBCUTANEOUS
  Administered 2021-03-24: 2 [IU] via SUBCUTANEOUS

## 2021-03-21 MED ORDER — INSULIN ASPART 100 UNIT/ML IJ SOLN
0.0000 [IU] | Freq: Three times a day (TID) | INTRAMUSCULAR | Status: DC
  Administered 2021-03-21: 11 [IU] via SUBCUTANEOUS
  Administered 2021-03-22: 4 [IU] via SUBCUTANEOUS
  Administered 2021-03-22: 3 [IU] via SUBCUTANEOUS
  Administered 2021-03-22 – 2021-03-23 (×2): 4 [IU] via SUBCUTANEOUS
  Administered 2021-03-23: 15 [IU] via SUBCUTANEOUS
  Administered 2021-03-24: 3 [IU] via SUBCUTANEOUS
  Administered 2021-03-24 (×2): 7 [IU] via SUBCUTANEOUS
  Administered 2021-03-25: 15 [IU] via SUBCUTANEOUS

## 2021-03-21 NOTE — H&P (Signed)
History and Physical    Jodi Mcgee ZRA:[id number] DOB: [birth year] DOA: 03/21/2021  PCP: [facility]  Patient coming from: Home  Chief Complaint: back pain and abdominal pain  HPI: Jodi Mcgee is a 35 y.o. female with medical history significant of anxiety, DM2, GERD. Presenting with back pain. She says she woke up with the pain yesterday. It's stabbing pain in the low back and wrapping to her left flank. OTC meds at home did not help. She noticed that her urine was darker yesterday and that she had some burning w/ urination. She tried to sleep last night but had difficulty. When she woke this morning, her pain now included the lower left quadrant of the abdomen. She became concerned and came to the ED. She denies any other aggravating or alleviating factors.   ED Course: UA was dirty. CT renal study was negative for stones. She was started on rocephin for pyelonephritis. TRH was called for admission.   Review of Systems:  Review of systems is otherwise negative for all not mentioned in HPI.   PMHx Past Medical History:  Diagnosis Date  . Anemia   . Anxiety   . Blood transfusion without reported diagnosis    both CS  . Cocaine abuse ([place])   . Diabetes mellitus without complication ([place])   . GERD (gastroesophageal reflux disease)    has resolved  . IBS (irritable bowel syndrome)   . Ovarian cyst   . Peritonitis, acute generalized ([place])   . Pulmonary embolism ([place])   . SBO (small bowel obstruction) ([place])   . Sepsis ([place])     PSHx Past Surgical History:  Procedure Laterality Date  . APPENDECTOMY     ruptured   . BUNIONECTOMY    . CESAREAN SECTION    . CESAREAN SECTION Bilateral 11/20/2017   Procedure: CESAREAN SECTION;  Surgeon: Sloan Leiter, MD;  Location: [facility];  Service: Obstetrics;  Laterality: Bilateral;  . OVARIAN CYST DRAINAGE    . SMALL BOWEL REPAIR      SocHx  reports that she has been smoking  cigarettes. She has been smoking about 0.50 packs per day. She has never used smokeless tobacco. She reports current alcohol use. She reports previous drug use.  Allergies  Allergen Reactions  . Hydrocodone Itching  . Cetirizine & Related Itching and Swelling  . Toradol [Ketorolac Tromethamine] Hives and Itching  . Flagyl [Metronidazole] Itching and Swelling  . Contrast Media [Iodinated Diagnostic Agents] Itching and Other (See Comments)    Mild itching after IV contrast on 06/01/2017No urticaria visible, wheezing, or angioedema   . Nsaids Rash    FamHx Family History  Problem Relation Age of Onset  . Hypertension Mother   . Anemia Mother   . Diabetes Father     Prior to Admission medications   Medication Sig Start Date End Date Taking? Authorizing Provider  blood glucose meter kit and supplies KIT Dispense based on patient and insurance preference. Use up to four times daily as directed. (FOR ICD-9 250.00, 250.01). 02/21/19   Oretha Milch D, MD  dicyclomine (BENTYL) 20 MG tablet Take 20 mg by mouth 2 (two) times daily. 05/27/20   [provider]  HUMULIN R 100 UNIT/ML injection Inject 0-20 Units into the skin 3 (three) times daily before meals. Per sliding scale 03/24/20   [provider]  HYDROcodone-acetaminophen (NORCO/VICODIN) 5-325 MG tablet Take 1 tablet by mouth every 6 (six) hours as needed. 06/09/20  [provider]  Hyoscyamine Sulfate SL (LEVSIN/SL) 0.125 MG SUBL Place 1 each under the tongue 4 (four) times daily as needed for up to 5 days. 12/15/20 12/20/20  Fatima Blank, MD  insulin detemir (LEVEMIR) 100 unit/ml SOLN Inject 20 Units into the skin 3 (three) times daily. 03/02/21   Matilde Haymaker, MD  Levonorgestrel (LILETTA) 19.5 MCG/DAY IUD IUD 1 each by Intrauterine route continuous. Placed in 2019    [provider]  methocarbamol (ROBAXIN) 500 MG tablet Take 1 tablet (500 mg total) by mouth every 8 (eight) hours as needed for muscle  spasms. 05/02/20   Orpah Greek, MD  omeprazole (PRILOSEC) 40 MG capsule Take 1 capsule (40 mg total) by mouth daily. 11/04/20   Molpus, John, MD  ondansetron (ZOFRAN ODT) 4 MG disintegrating tablet 47m ODT q4 hours prn nausea/vomit 03/20/21   FDeno Etienne DO  oxyCODONE-acetaminophen (PERCOCET/ROXICET) 5-325 MG tablet Take 1 tablet by mouth every 6 (six) hours as needed. 06/01/20   [provider]  potassium chloride SA (KLOR-CON) 20 MEQ tablet Take 1 tablet (20 mEq total) by mouth daily. 09/29/20   FJacqlyn Larsen PA-C  promethazine (PHENERGAN) 25 MG suppository Place 1 suppository (25 mg total) rectally every 6 (six) hours as needed for nausea or vomiting. 03/20/21   FDeno Etienne DO  sucralfate (CARAFATE) 1 g tablet Take 1 tablet (1 g total) by mouth 4 (four) times daily -  with meals and at bedtime. 11/04/20   Molpus, JJenny Reichmann MD    Physical Exam: Vitals:   03/21/21 1230 03/21/21 1245 03/21/21 1430 03/21/21 1530  BP: (!) 145/95 (!) 145/92 (!) 153/111 (!) 140/99  Pulse: (!) 101 (!) 106 (!) 105 (!) 116  Resp:  17  18  Temp:      TempSrc:      SpO2: 100% 99% 98% 98%  Weight:      Height:        General: 35y.o. female resting in bed in NAD Eyes: PERRL, normal sclera ENMT: Nares patent w/o discharge, orophaynx clear, dentition normal, ears w/o discharge/lesions/ulcers Neck: Supple, trachea midline Cardiovascular: tachy, +S1, S2, no m/g/r, equal pulses throughout Respiratory: CTABL, no w/r/r, normal WOB GI: BS+, NDNT, no masses noted, no organomegaly noted MSK: No e/c/c, left CVAT  Skin: No rashes, bruises, ulcerations noted Neuro: A&O x 3, no focal deficits Psyc: Appropriate interaction and affect, calm/cooperative  Labs on Admission: I have personally reviewed following labs and imaging studies  CBC: Recent Labs  Lab 03/20/21 0100 03/21/21 0859  WBC 6.8 8.4  NEUTROABS 5.8 7.2  HGB 14.4 14.1  HCT 43.9 42.9  MCV 83.6 84.3  PLT 307 2025  Basic Metabolic  Panel: Recent Labs  Lab 03/20/21 0100 03/21/21 0859  NA 136 134*  K 2.6* 3.9  CL 97* 98  CO2 25 24  GLUCOSE 369* 339*  BUN 10 8  CREATININE 0.55 0.62  CALCIUM 9.3 9.4   GFR: Estimated Creatinine Clearance: 95.4 mL/min (by C-G formula based on SCr of 0.62 mg/dL). Liver Function Tests: Recent Labs  Lab 03/21/21 0859  AST 25  ALT 12  ALKPHOS 91  BILITOT 1.7*  PROT 8.1  ALBUMIN 3.5   No results for input(s): LIPASE, AMYLASE in the last 168 hours. No results for input(s): AMMONIA in the last 168 hours. Coagulation Profile: No results for input(s): INR, PROTIME in the last 168 hours. Cardiac Enzymes: No results for input(s): CKTOTAL, CKMB, CKMBINDEX, TROPONINI in the last 168 hours.  BNP (last 3 results) No results for input(s): PROBNP in the last 8760 hours. HbA1C: No results for input(s): HGBA1C in the last 72 hours. CBG: Recent Labs  Lab 03/20/21 0444  GLUCAP 318*   Lipid Profile: No results for input(s): CHOL, HDL, LDLCALC, TRIG, CHOLHDL, LDLDIRECT in the last 72 hours. Thyroid Function Tests: No results for input(s): TSH, T4TOTAL, FREET4, T3FREE, THYROIDAB in the last 72 hours. Anemia Panel: No results for input(s): VITAMINB12, FOLATE, FERRITIN, TIBC, IRON, RETICCTPCT in the last 72 hours. Urine analysis:    Component Value Date/Time   COLORURINE YELLOW 03/21/2021 0859   APPEARANCEUR CLOUDY (A) 03/21/2021 0859   APPEARANCEUR Clear 06/26/2014 0116   LABSPEC 1.022 03/21/2021 0859   LABSPEC 1.031 06/26/2014 0116   PHURINE 6.0 03/21/2021 0859   GLUCOSEU >=500 (A) 03/21/2021 0859   GLUCOSEU Negative 06/26/2014 0116   HGBUR MODERATE (A) 03/21/2021 0859   BILIRUBINUR NEGATIVE 03/21/2021 0859   BILIRUBINUR Negative 06/26/2014 0116   KETONESUR 80 (A) 03/21/2021 0859   PROTEINUR 100 (A) 03/21/2021 0859   UROBILINOGEN 0.2 05/17/2019 1326   NITRITE POSITIVE (A) 03/21/2021 0859   LEUKOCYTESUR LARGE (A) 03/21/2021 0859   LEUKOCYTESUR Trace 06/26/2014 0116     Radiological Exams on Admission: DG Chest Port 1 View  Result Date: 03/20/2021 CLINICAL DATA:  Chest pain EXAM: PORTABLE CHEST 1 VIEW COMPARISON:  08/22/2019 FINDINGS: Single frontal view of the chest demonstrates an unremarkable cardiac silhouette. No acute airspace disease, effusion, or pneumothorax. No acute bony abnormalities. IMPRESSION: 1. No acute intrathoracic process. Electronically Signed   By: Randa Ngo M.D.   On: 03/20/2021 00:42   CT Renal Stone Study  Result Date: 03/21/2021 CLINICAL DATA:  UTI, flank pain EXAM: CT ABDOMEN AND PELVIS WITHOUT CONTRAST TECHNIQUE: Multidetector CT imaging of the abdomen and pelvis was performed following the standard protocol without IV contrast. COMPARISON:  10/31/2020 FINDINGS: Lower chest: No acute abnormality. Hepatobiliary: No focal liver abnormality is seen. No gallstones, gallbladder wall thickening, or biliary dilatation. Pancreas: Unremarkable. No pancreatic ductal dilatation or surrounding inflammatory changes. Spleen: Normal in size without focal abnormality. Adrenals/Urinary Tract: Adrenal glands are unremarkable. Kidneys are normal, without renal calculi, focal lesion, or hydronephrosis. Bladder is unremarkable. Stomach/Bowel: Fluid-filled stomach. No bowel dilatation to suggest obstruction. No bowel wall thickening. Moderate amount of stool in the ascending and transverse colon. No pneumatosis, pneumoperitoneum or portal venous gas. Vascular/Lymphatic: No significant vascular findings are present. No enlarged abdominal or pelvic lymph nodes. Reproductive: Normal uterus. Intrauterine device in the uterus. 4 cm cystic right adnexal mass likely rising from the right ovary. No further evaluation recommended at this time. Other: No abdominal wall hernia or abnormality. No abdominopelvic ascites. Musculoskeletal: No acute osseous abnormality. No aggressive osseous lesion IMPRESSION: 1. Fluid-filled stomach which may reflect recently ingested  fluid versus gastroenteritis. 2. No urolithiasis or obstructive uropathy. 3. Moderate amount of stool in the ascending and transverse colon. Electronically Signed   By: Kathreen Devoid   On: 03/21/2021 15:30    EKG: Independently reviewed. Sinus tach, no st elevation  Assessment/Plan UTI/Pyelonephrtitis     - admit to inpt, med-surg     - follow UCx     - rocephin 2g IV qday     - fluids, pain control, anti-emetics  DM2     - levemir, SSI, glucose checks, A1c, carb mod diet      HTN      - amlodipine  Hypokalemia     - replace K+, check Mg2+  Elevated bilirubin     - get fractionated bili  GERD     - protonix  DVT prophylaxis: SCDs  Code Status: FULL  Family Communication: None at bedside  Consults called: None   Status is: Inpatient  Remains inpatient appropriate because:Inpatient level of care appropriate due to severity of illness   Dispo: The patient is from: Home              Anticipated d/c is to: Home              Patient currently is not medically stable to d/c.   Difficult to place patient No  Time spent coordinating admission: 70 minutes  [facility]  If 7PM-7AM, please contact night-coverage [email or web address]  03/21/2021, 3:57 PM

## 2021-03-21 NOTE — ED Notes (Signed)
ED TO INPATIENT HANDOFF REPORT  Name/Age/Gender Jodi Mcgee 35 y.o. female  Code Status Code Status History    Date Active Date Inactive Code Status Order ID Comments User Context   05/10/2020 1100 05/12/2020 1924 Full Code [id number]  Donne Hazel, MD Inpatient   02/20/2019 0053 02/21/2019 1635 Full Code [id number]  Lenore Cordia, MD ED   09/26/2018 1904 09/27/2018 1633 Full Code [id number]  Charlynne Cousins, MD ED   11/30/2017 1851 12/02/2017 2151 Full Code [id number]  Tresea Mall, CNM Inpatient   11/21/2017 0008 11/23/2017 1717 Full Code [id number]  Osborne Oman, MD Inpatient   11/16/2017 0933 11/17/2017 1222 Full Code [id number]  Aletha Halim, MD Inpatient   11/05/2017 2257 11/08/2017 1708 Full Code [id number]  [name], MD Inpatient   Advance Care Planning Activity    Questions for Most Recent Historical Code Status (Order [id number])       Home/SNF/Other Home  Chief Complaint Pyelonephritis [N12]  Level of Care/Admitting Diagnosis ED Disposition    ED Disposition Condition [place]: [facility] [100102]  Level of Care: Med-Surg [16]  May admit patient to [facility] or [facility] if equivalent level of care is available:: No  Covid Evaluation: Confirmed COVID Negative  Diagnosis: Pyelonephritis [993716]  Admitting Physician: Jonnie Finner [[id number]]  Attending Physician: Jonnie Finner [[id number]]  Estimated length of stay: past midnight tomorrow  Certification:: I certify this patient will need inpatient services for at least 2 midnights       Medical History Past Medical History:  Diagnosis Date  . Anemia   . Anxiety   . Blood transfusion without reported diagnosis    both CS  . Cocaine abuse ([place])   . Diabetes mellitus without complication ([place])   . GERD (gastroesophageal reflux disease)    has resolved  . IBS (irritable bowel syndrome)   . Ovarian cyst   . Peritonitis, acute generalized ([place])   .  Pulmonary embolism ([place])   . SBO (small bowel obstruction) ([place])   . Sepsis ([place])     Allergies Allergies  Allergen Reactions  . Hydrocodone Itching  . Cetirizine & Related Itching and Swelling  . Toradol [Ketorolac Tromethamine] Hives and Itching  . Flagyl [Metronidazole] Itching and Swelling  . Contrast Media [Iodinated Diagnostic Agents] Itching and Other (See Comments)    Mild itching after IV contrast on 06/01/2017No urticaria visible, wheezing, or angioedema   . Nsaids Rash    IV Location/Drains/Wounds Patient Lines/Drains/Airways Status    Active Line/Drains/Airways    Name Placement date Placement time Site Days   Peripheral IV 03/21/21 20 G Right Antecubital 03/21/21  0940  Antecubital  less than 1          Labs/Imaging Results for orders placed or performed during the hospital encounter of 03/21/21 (from the past 48 hour(s))  CBC with Differential/Platelet     Status: Abnormal   Collection Time: 03/21/21  8:59 AM  Result Value Ref Range   WBC 8.4 4.0 - 10.5 K/uL   RBC 5.09 3.87 - 5.11 MIL/uL   Hemoglobin 14.1 12.0 - 15.0 g/dL   HCT 42.9 36.0 - 46.0 %   MCV 84.3 80.0 - 100.0 fL   MCH 27.7 26.0 - 34.0 pg   MCHC 32.9 30.0 - 36.0 g/dL   RDW 14.5 11.5 - 15.5 %   Platelets 241 150 - 400 K/uL   nRBC 0.0 0.0 - 0.2 %  Neutrophils Relative % 85 %   Neutro Abs 7.2 1.7 - 7.7 K/uL   Lymphocytes Relative 5 %   Lymphs Abs 0.4 (L) 0.7 - 4.0 K/uL   Monocytes Relative 9 %   Monocytes Absolute 0.8 0.1 - 1.0 K/uL   Eosinophils Relative 0 %   Eosinophils Absolute 0.0 0.0 - 0.5 K/uL   Basophils Relative 0 %   Basophils Absolute 0.0 0.0 - 0.1 K/uL   Immature Granulocytes 1 %   Abs Immature Granulocytes 0.04 0.00 - 0.07 K/uL    Comment: Performed at [facility], [place] [address] [place], [place] [address]  Comprehensive metabolic panel     Status: Abnormal   Collection Time: 03/21/21  8:59 AM  Result Value Ref Range   Sodium 134 (L) 135 - 145 mmol/L    Potassium 3.9 3.5 - 5.1 mmol/L    Comment: DELTA CHECK NOTED MODERATE HEMOLYSIS    Chloride 98 98 - 111 mmol/L   CO2 24 22 - 32 mmol/L   Glucose, Bld 339 (H) 70 - 99 mg/dL    Comment: Glucose reference range applies only to samples taken after fasting for at least 8 hours.   BUN 8 6 - 20 mg/dL   Creatinine, Ser 0.62 0.44 - 1.00 mg/dL   Calcium 9.4 8.9 - 10.3 mg/dL   Total Protein 8.1 6.5 - 8.1 g/dL   Albumin 3.5 3.5 - 5.0 g/dL   AST 25 15 - 41 U/L   ALT 12 0 - 44 U/L   Alkaline Phosphatase 91 38 - 126 U/L   Total Bilirubin 1.7 (H) 0.3 - 1.2 mg/dL   GFR, Estimated >60 >60 mL/min    Comment: (NOTE) Calculated using the CKD-EPI Creatinine Equation (2021)    Anion gap 12 5 - 15    Comment: Performed at [facility], [place] [address] [place], [place] [address]  Urinalysis, Routine w reflex microscopic Urine, Clean Catch     Status: Abnormal   Collection Time: 03/21/21  8:59 AM  Result Value Ref Range   Color, Urine YELLOW YELLOW   APPearance CLOUDY (A) CLEAR   Specific Gravity, Urine 1.022 1.005 - 1.030   pH 6.0 5.0 - 8.0   Glucose, UA >=500 (A) NEGATIVE mg/dL   Hgb urine dipstick MODERATE (A) NEGATIVE   Bilirubin Urine NEGATIVE NEGATIVE   Ketones, ur 80 (A) NEGATIVE mg/dL   Protein, ur 100 (A) NEGATIVE mg/dL   Nitrite POSITIVE (A) NEGATIVE   Leukocytes,Ua LARGE (A) NEGATIVE   RBC / HPF 11-20 0 - 5 RBC/hpf   WBC, UA >50 (H) 0 - 5 WBC/hpf   Bacteria, UA RARE (A) NONE SEEN   Squamous Epithelial / LPF 11-20 0 - 5    Comment: Performed at [facility], [place] [address] [place], [place] [address]  I-Stat Beta hCG blood, ED ([facility], [facility], [facility] only)     Status: None   Collection Time: 03/21/21  9:27 AM  Result Value Ref Range   I-stat hCG, quantitative <5.0 <5 mIU/mL   Comment 3            Comment:   GEST. AGE      CONC.  (mIU/mL)   <=1 WEEK        5 - 50     2 WEEKS       50 - 500     3 WEEKS       100 - 10,000  4 WEEKS     1,000 - 30,000         FEMALE AND NON-PREGNANT FEMALE:     LESS THAN 5 mIU/mL   Resp Panel by RT-PCR (Flu A&B, Covid) Nasopharyngeal Swab     Status: None   Collection Time: 03/21/21  2:44 PM   Specimen: Nasopharyngeal Swab; Nasopharyngeal(NP) swabs in vial transport medium  Result Value Ref Range   SARS Coronavirus 2 by RT PCR NEGATIVE NEGATIVE    Comment: (NOTE) SARS-CoV-2 target nucleic acids are NOT DETECTED.  The SARS-CoV-2 RNA is generally detectable in upper respiratory specimens during the acute phase of infection. The lowest concentration of SARS-CoV-2 viral copies this assay can detect is 138 copies/mL. A negative result does not preclude SARS-Cov-2 infection and should not be used as the sole basis for treatment or other patient management decisions. A negative result may occur with  improper specimen collection/handling, submission of specimen other than nasopharyngeal swab, presence of viral mutation(s) within the areas targeted by this assay, and inadequate number of viral copies(<138 copies/mL). A negative result must be combined with clinical observations, patient history, and epidemiological information. The expected result is Negative.  Fact Sheet for Patients:  EntrepreneurPulse.com.au  Fact Sheet for Healthcare Providers:  IncredibleEmployment.be  This test is no t yet approved or cleared by the [place] FDA and  has been authorized for detection and/or diagnosis of SARS-CoV-2 by FDA under an Emergency Use Authorization (EUA). This EUA will remain  in effect (meaning this test can be used) for the duration of the COVID-19 declaration under Section 564(b)(1) of the Act, 21 U.S.C.section 360bbb-3(b)(1), unless the authorization is terminated  or revoked sooner.       Influenza A by PCR NEGATIVE NEGATIVE   Influenza B by PCR NEGATIVE NEGATIVE    Comment: (NOTE) The Xpert Xpress SARS-CoV-2/FLU/RSV plus assay is intended as an aid in the  diagnosis of influenza from Nasopharyngeal swab specimens and should not be used as a sole basis for treatment. Nasal washings and aspirates are unacceptable for Xpert Xpress SARS-CoV-2/FLU/RSV testing.  Fact Sheet for Patients: EntrepreneurPulse.com.au  Fact Sheet for Healthcare Providers: IncredibleEmployment.be  This test is not yet approved or cleared by the [place] FDA and has been authorized for detection and/or diagnosis of SARS-CoV-2 by FDA under an Emergency Use Authorization (EUA). This EUA will remain in effect (meaning this test can be used) for the duration of the COVID-19 declaration under Section 564(b)(1) of the Act, 21 U.S.C. section 360bbb-3(b)(1), unless the authorization is terminated or revoked.  Performed at [facility], [place] [address] [place], [place] [address]   Potassium     Status: Abnormal   Collection Time: 03/21/21  3:33 PM  Result Value Ref Range   Potassium 3.2 (L) 3.5 - 5.1 mmol/L    Comment: DELTA CHECK NOTED Performed at [place] [address], [place], [place] [address]    DG Chest Port 1 View  Result Date: 03/20/2021 CLINICAL DATA:  Chest pain EXAM: PORTABLE CHEST 1 VIEW COMPARISON:  08/22/2019 FINDINGS: Single frontal view of the chest demonstrates an unremarkable cardiac silhouette. No acute airspace disease, effusion, or pneumothorax. No acute bony abnormalities. IMPRESSION: 1. No acute intrathoracic process. Electronically Signed   By: Randa Ngo M.D.   On: 03/20/2021 00:42   CT Renal Stone Study  Result Date: 03/21/2021 CLINICAL DATA:  UTI, flank pain EXAM: CT ABDOMEN AND PELVIS WITHOUT CONTRAST TECHNIQUE: Multidetector CT imaging of the abdomen  and pelvis was performed following the standard protocol without IV contrast. COMPARISON:  10/31/2020 FINDINGS: Lower chest: No acute abnormality. Hepatobiliary: No focal liver abnormality is seen. No  gallstones, gallbladder wall thickening, or biliary dilatation. Pancreas: Unremarkable. No pancreatic ductal dilatation or surrounding inflammatory changes. Spleen: Normal in size without focal abnormality. Adrenals/Urinary Tract: Adrenal glands are unremarkable. Kidneys are normal, without renal calculi, focal lesion, or hydronephrosis. Bladder is unremarkable. Stomach/Bowel: Fluid-filled stomach. No bowel dilatation to suggest obstruction. No bowel wall thickening. Moderate amount of stool in the ascending and transverse colon. No pneumatosis, pneumoperitoneum or portal venous gas. Vascular/Lymphatic: No significant vascular findings are present. No enlarged abdominal or pelvic lymph nodes. Reproductive: Normal uterus. Intrauterine device in the uterus. 4 cm cystic right adnexal mass likely rising from the right ovary. No further evaluation recommended at this time. Other: No abdominal wall hernia or abnormality. No abdominopelvic ascites. Musculoskeletal: No acute osseous abnormality. No aggressive osseous lesion IMPRESSION: 1. Fluid-filled stomach which may reflect recently ingested fluid versus gastroenteritis. 2. No urolithiasis or obstructive uropathy. 3. Moderate amount of stool in the ascending and transverse colon. Electronically Signed   By: Kathreen Devoid   On: 03/21/2021 15:30    Pending Labs Unresulted Labs (From admission, onward)          Start     Ordered   03/21/21 0859  Urine Culture  Once,   STAT        03/21/21 0859          Vitals/Pain Today's Vitals   03/21/21 1545 03/21/21 1600 03/21/21 1614 03/21/21 1615  BP: 115/75 124/77  (!) 133/99  Pulse: (!) 101 (!) 102  (!) 113  Resp:    15  Temp:      TempSrc:      SpO2: 97% 96%  99%  Weight:      Height:      PainSc:   4      Isolation Precautions No active isolations  Medications Medications  promethazine (PHENERGAN) 25 mg in sodium chloride 0.9 % 50 mL IVPB (0 mg Intravenous Stopped 03/21/21 1649)  cefTRIAXone  (ROCEPHIN) 1 g in sodium chloride 0.9 % 100 mL IVPB (0 g Intravenous Stopped 03/21/21 1032)  HYDROmorphone (DILAUDID) injection 0.5 mg (0.5 mg Intravenous Given 03/21/21 0948)  ondansetron (ZOFRAN) injection 4 mg (4 mg Intravenous Given 03/21/21 0948)  sodium chloride 0.9 % bolus 1,000 mL (0 mLs Intravenous Stopped 03/21/21 1109)  sodium chloride 0.9 % bolus 1,000 mL (0 mLs Intravenous Stopped 03/21/21 1613)  ondansetron (ZOFRAN-ODT) disintegrating tablet 4 mg (4 mg Oral Given 03/21/21 1422)  oxyCODONE-acetaminophen (PERCOCET/ROXICET) 5-325 MG per tablet 1 tablet (1 tablet Oral Given 03/21/21 1421)  fentaNYL (SUBLIMAZE) injection 100 mcg (100 mcg Intravenous Given 03/21/21 1519)    Mobility walks

## 2021-03-21 NOTE — ED Notes (Signed)
Pt tolerating oral fluid

## 2021-03-21 NOTE — ED Provider Notes (Signed)
[facility] DEPT Provider Note   CSN: [id number] Arrival date & time: 03/21/21  9201     History Chief Complaint  Patient presents with  . Back Pain    Jodi Mcgee is a 35 y.o. female.  Patient with history of diabetes, PE in 2019 10 days postpartum, not currently on anticoagulation --presents to the emergency department for evaluation of back pain.  Patient states that she woke with severe pain today.  She also has some abdominal pain.  Patient was seen earlier yesterday morning in the emergency department for chest pain.  She was told that her blood sugar was elevated at that time.  She states that her back pain was not present then.  Urine with signs of infection yesterday, however no treatment due to lack of symptoms.  Patient reports some burning with urination.  No fevers or vomiting but she has been nauseous.  Abdomen is generally tender without radiation.  No hematuria, increased frequency or urgency.  She does not complain today of chest pain today or shortness of breath.  No treatments prior to arrival.  The onset of this condition was acute. The course is constant. Aggravating factors: none. Alleviating factors: none. Patient denies warning symptoms of back pain including: fecal incontinence, urinary retention or overflow incontinence, night sweats, waking from sleep with back pain, unexplained fevers or weight loss, h/o cancer, IVDU, recent trauma.            Past Medical History:  Diagnosis Date  . Anemia   . Anxiety   . Blood transfusion without reported diagnosis    both CS  . Cocaine abuse ([place])   . Diabetes mellitus without complication ([place])   . GERD (gastroesophageal reflux disease)    has resolved  . IBS (irritable bowel syndrome)   . Ovarian cyst   . Peritonitis, acute generalized ([place])   . Pulmonary embolism ([place])   . SBO (small bowel obstruction) ([place])   . Sepsis [facility])     Patient Active Problem List   Diagnosis  Date Noted  . Controlled type 2 diabetes mellitus with hyperglycemia ([place]) 05/10/2020  . Partial small bowel obstruction ([place]) 05/09/2020  . Bartholin's gland abscess 02/21/2019  . DKA (diabetic ketoacidoses) 02/20/2019  . Blood per rectum 02/20/2019  . Hyponatremia 09/26/2018  . Chronic hypertension 01/03/2018  . IUD contraception 01/03/2018  . Pulmonary embolism during puerperium 12/04/2017  . Pulmonary edema 11/30/2017  . Pelvic adhesive disease 10/22/2017  . Nasal septal perforation 10/17/2017  . History of cocaine abuse ([place]) 10/17/2017  . Epistaxis 10/17/2017  . 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 Obesity (BMI 30-39.9) 09/25/2017  . Type 2 diabetes mellitus, without long-term current use of insulin ([place]) 08/23/2017  . Chronic pain syndrome 08/23/2017    Past Surgical History:  Procedure Laterality Date  . APPENDECTOMY     ruptured   . BUNIONECTOMY    . CESAREAN SECTION    . CESAREAN SECTION Bilateral 11/20/2017   Procedure: CESAREAN SECTION;  Surgeon: Sloan Leiter, MD;  Location: [facility];  Service: Obstetrics;  Laterality: Bilateral;  . OVARIAN CYST DRAINAGE    . SMALL BOWEL REPAIR       OB History    Gravida  3   Para  3   Term  1   Preterm  2   AB  0   Living  3     SAB  0   IAB  0   Ectopic  0   Multiple  0   Live Births  3           Family History  Problem Relation Age of Onset  . Hypertension Mother   . Anemia Mother   . Diabetes Father     Social History   Tobacco Use  . Smoking status: Light Tobacco Smoker    Packs/day: 0.50    Types: Cigarettes  . Smokeless tobacco: Never Used  . Tobacco comment: social use with tobacco  Vaping Use  . Vaping Use: Some days  Substance Use Topics  . Alcohol use: Yes    Comment: 3 times a month  . Drug use: Not Currently    Home Medications Prior to Admission medications   Medication Sig Start Date End Date Taking? Authorizing Provider  blood glucose meter kit and  supplies KIT Dispense based on patient and insurance preference. Use up to four times daily as directed. (FOR ICD-9 250.00, 250.01). 02/21/19   Oretha Milch D, MD  dicyclomine (BENTYL) 20 MG tablet Take 20 mg by mouth 2 (two) times daily. 05/27/20   [provider]  HUMULIN R 100 UNIT/ML injection Inject 0-20 Units into the skin 3 (three) times daily before meals. Per sliding scale 03/24/20   [provider]  HYDROcodone-acetaminophen (NORCO/VICODIN) 5-325 MG tablet Take 1 tablet by mouth every 6 (six) hours as needed. 06/09/20   [provider]  Hyoscyamine Sulfate SL (LEVSIN/SL) 0.125 MG SUBL Place 1 each under the tongue 4 (four) times daily as needed for up to 5 days. 12/15/20 12/20/20  Fatima Blank, MD  insulin detemir (LEVEMIR) 100 unit/ml SOLN Inject 20 Units into the skin 3 (three) times daily. 03/02/21   Matilde Haymaker, MD  Levonorgestrel (LILETTA) 19.5 MCG/DAY IUD IUD 1 each by Intrauterine route continuous. Placed in 2019    [provider]  methocarbamol (ROBAXIN) 500 MG tablet Take 1 tablet (500 mg total) by mouth every 8 (eight) hours as needed for muscle spasms. 05/02/20   Orpah Greek, MD  omeprazole (PRILOSEC) 40 MG capsule Take 1 capsule (40 mg total) by mouth daily. 11/04/20   Molpus, John, MD  ondansetron (ZOFRAN ODT) 4 MG disintegrating tablet 67m ODT q4 hours prn nausea/vomit 03/20/21   FDeno Etienne DO  oxyCODONE-acetaminophen (PERCOCET/ROXICET) 5-325 MG tablet Take 1 tablet by mouth every 6 (six) hours as needed. 06/01/20   [provider]  potassium chloride SA (KLOR-CON) 20 MEQ tablet Take 1 tablet (20 mEq total) by mouth daily. 09/29/20   FJacqlyn Larsen PA-C  promethazine (PHENERGAN) 25 MG suppository Place 1 suppository (25 mg total) rectally every 6 (six) hours as needed for nausea or vomiting. 03/20/21   FDeno Etienne DO  sucralfate (CARAFATE) 1 g tablet Take 1 tablet (1 g total) by mouth 4 (four) times daily -  with meals and  at bedtime. 11/04/20   Molpus, JJenny Reichmann MD    Allergies    Hydrocodone, Cetirizine & related, Toradol [ketorolac tromethamine], Flagyl [metronidazole], Contrast media [iodinated diagnostic agents], and Nsaids  Review of Systems   Review of Systems  Constitutional: Negative for fever.  HENT: Negative for rhinorrhea and sore throat.   Eyes: Negative for redness.  Respiratory: Negative for cough.   Cardiovascular: Negative for chest pain.  Gastrointestinal: Negative for abdominal pain, diarrhea, nausea and vomiting.  Genitourinary: Positive for dysuria. Negative for frequency, hematuria and urgency.  Musculoskeletal: Positive for back pain. Negative for myalgias.  Skin: Negative for rash.  Neurological: Negative  for headaches.    Physical Exam Updated Vital Signs BP (!) 142/100 (BP Location: Left Arm)   Pulse (!) 109   Temp (!) 97.4 F (36.3 C) (Oral)   Resp 18   Ht _0  (1.702 m)   Wt 63.5 kg   SpO2 100%   BMI 21.93 kg/m   Physical Exam Vitals and nursing note reviewed.  Constitutional:      General: She is in acute distress.     Appearance: She is well-developed. She is not ill-appearing or diaphoretic.     Comments: Patient crying in pain, rolling on the bed during exam  HENT:     Head: Normocephalic and atraumatic.     Right Ear: External ear normal.     Left Ear: External ear normal.     Nose: Nose normal.  Eyes:     Conjunctiva/sclera: Conjunctivae normal.  Cardiovascular:     Rate and Rhythm: Normal rate and regular rhythm.     Heart sounds: No murmur heard.   Pulmonary:     Effort: No respiratory distress.     Breath sounds: No wheezing, rhonchi or rales.  Abdominal:     Palpations: Abdomen is soft.     Tenderness: There is abdominal tenderness. There is no guarding or rebound.     Comments: Patient moans with palpation anywhere on her abdomen.  Musculoskeletal:     Cervical back: Normal range of motion and neck supple.     Right lower leg: No edema.      Left lower leg: No edema.     Comments: There is tenderness to palpation of the bilateral lumbar paraspinous areas.  No skin findings.  Patient reports that the pain is worse on the left than the right.  Skin:    General: Skin is warm and dry.     Findings: No rash.  Neurological:     General: No focal deficit present.     Mental Status: She is alert. Mental status is at baseline.     Motor: No weakness.  Psychiatric:        Mood and Affect: Mood normal.     ED Results / Procedures / Treatments   Labs (all labs ordered are listed, but only abnormal results are displayed) Labs Reviewed  CBC WITH DIFFERENTIAL/PLATELET - Abnormal; Notable for the following components:      Result Value   Lymphs Abs 0.4 (*)    All other components within normal limits  COMPREHENSIVE METABOLIC PANEL - Abnormal; Notable for the following components:   Sodium 134 (*)    Glucose, Bld 339 (*)    Total Bilirubin 1.7 (*)    All other components within normal limits  URINALYSIS, ROUTINE W REFLEX MICROSCOPIC - Abnormal; Notable for the following components:   APPearance CLOUDY (*)    Glucose, UA >=500 (*)    Hgb urine dipstick MODERATE (*)    Ketones, ur 80 (*)    Protein, ur 100 (*)    Nitrite POSITIVE (*)    Leukocytes,Ua LARGE (*)    WBC, UA >50 (*)    Bacteria, UA RARE (*)    All other components within normal limits  RESP PANEL BY RT-PCR (FLU A&B, COVID) ARPGX2  URINE CULTURE  POTASSIUM  I-STAT BETA HCG BLOOD, ED ([facility], [facility], [facility] ONLY)    EKG None  Radiology DG Chest Port 1 View  Result Date: 03/20/2021 CLINICAL DATA:  Chest pain EXAM: PORTABLE CHEST 1 VIEW COMPARISON:  08/22/2019 FINDINGS:  Single frontal view of the chest demonstrates an unremarkable cardiac silhouette. No acute airspace disease, effusion, or pneumothorax. No acute bony abnormalities. IMPRESSION: 1. No acute intrathoracic process. Electronically Signed   By: Randa Ngo M.D.   On: 03/20/2021 00:42    Procedures Procedures    Medications Ordered in ED Medications  promethazine (PHENERGAN) 25 mg in sodium chloride 0.9 % 50 mL IVPB (has no administration in time range)  cefTRIAXone (ROCEPHIN) 1 g in sodium chloride 0.9 % 100 mL IVPB (0 g Intravenous Stopped 03/21/21 1032)  HYDROmorphone (DILAUDID) injection 0.5 mg (0.5 mg Intravenous Given 03/21/21 0948)  ondansetron (ZOFRAN) injection 4 mg (4 mg Intravenous Given 03/21/21 0948)  sodium chloride 0.9 % bolus 1,000 mL (0 mLs Intravenous Stopped 03/21/21 1109)  sodium chloride 0.9 % bolus 1,000 mL (1,000 mLs Intravenous New Bag/Given 03/21/21 1110)  ondansetron (ZOFRAN-ODT) disintegrating tablet 4 mg (4 mg Oral Given 03/21/21 1422)  oxyCODONE-acetaminophen (PERCOCET/ROXICET) 5-325 MG per tablet 1 tablet (1 tablet Oral Given 03/21/21 1421)  fentaNYL (SUBLIMAZE) injection 100 mcg (100 mcg Intravenous Given 03/21/21 1519)    ED Course  I have reviewed the triage vital signs and the nursing notes.  Pertinent labs & imaging results that were available during my care of the patient were reviewed by me and considered in my medical decision making (see chart for details).  Patient seen and examined.  Reviewed visit from yesterday as well as testing in 2019 when she was diagnosed with a PE.  Symptoms are atypical for PE today.  New symptoms since yesterday include back pain, nausea and abdominal pain.  Work-up initiated. Medications ordered.   Vital signs reviewed and are as follows: BP (!) 142/100 (BP Location: Left Arm)   Pulse (!) 109   Temp (!) 97.4 F (36.3 C) (Oral)   Resp 18   Ht _0  (1.702 m)   Wt 63.5 kg   SpO2 100%   BMI 21.93 kg/m   11:07 AM patient states that she is feeling a bit better.  She no longer is crying or in apparent distress.  Her IV fluids have just finished.  We will give another 1 L due to her diabetes and ketones in the urine.  Her anion gap is normal and I do not suspect DKA.  She will be fluid challenged.  She has received Keflex for possible  [place].  She states that she would like to go home today.  We will continue to get her feeling as good as possible.  1:19 PM patient rechecked.  She is up in the room trying to disconnect herself from IV tubing so she can use the restroom.  She reports increasing pain in her lower back.  She states that she vomited once after drinking liquid in the room.  We will give ODT Zofran and dose of oral pain medication.  Will need reassessment, suspected pyelonephritis.  If she cannot tolerate fluids, may need admission to the hospital.  3:29 PM patient reevaluated earlier.  Her pain has returned.  She looks very uncomfortable.  Symptoms are not controlled with oral medications.  I am concerned that she will not be able to tolerate p.o. meds if discharged.  She is in agreement.  COVID ordered.  Will obtain renal contrast CT to rule out stone given extent of pain.  COVID/flu negative.    3:35 PM CT neg. Signout to [employer] who will admit.      MDM Rules/Calculators/A&P  Admit.  Complicated UTI in a diabetic patient.  Intractable vomiting.   Final Clinical Impression(s) / ED Diagnoses Final diagnoses:  Acute pyelonephritis  Intractable vomiting with nausea, unspecified vomiting type    Rx / DC Orders ED Discharge Orders    None       [place]isle Cater, Hershal Coria 03/21/21 1535    Lacretia Leigh, MD 03/25/21 [phone]

## 2021-03-21 NOTE — ED Notes (Signed)
Pt ambulatory to restroom without any assistance.

## 2021-03-21 NOTE — ED Notes (Signed)
Pt used w/c to transfer to and from restroom. Pt provided urine sample. Pts urine is notably cloudy, placed at bedside awaiting order.

## 2021-03-21 NOTE — ED Triage Notes (Signed)
Pt from home and reports the following:  Pt woke up this morning with 10/10 lower left back pain. Pt said she has not taken dibestes medication today.

## 2021-03-21 NOTE — ED Provider Notes (Signed)
Care of the patient received from [facility].  In short, 35 year old female who presents with back pain over the last 24 hours.  UA consistent with UTI, CT renal study without evidence of renal stones.  Patient symptoms likely due to pyelonephritis, however patient has been unable to tolerate p.o. fluids.  Received care pending hospitalist admission call.  I spoke with Dr. Marylyn Ishihara with the hospitalist team will admit the patient for further evaluation and treatment.  Patient received 1 g of Rocephin IV.  Stable for admission.  Results for orders placed or performed during the hospital encounter of 03/21/21  Resp Panel by RT-PCR (Flu A&B, Covid) Nasopharyngeal Swab   Specimen: Nasopharyngeal Swab; Nasopharyngeal(NP) swabs in vial transport medium  Result Value Ref Range   SARS Coronavirus 2 by RT PCR NEGATIVE NEGATIVE   Influenza A by PCR NEGATIVE NEGATIVE   Influenza B by PCR NEGATIVE NEGATIVE  CBC with Differential/Platelet  Result Value Ref Range   WBC 8.4 4.0 - 10.5 K/uL   RBC 5.09 3.87 - 5.11 MIL/uL   Hemoglobin 14.1 12.0 - 15.0 g/dL   HCT 42.9 36.0 - 46.0 %   MCV 84.3 80.0 - 100.0 fL   MCH 27.7 26.0 - 34.0 pg   MCHC 32.9 30.0 - 36.0 g/dL   RDW 14.5 11.5 - 15.5 %   Platelets 241 150 - 400 K/uL   nRBC 0.0 0.0 - 0.2 %   Neutrophils Relative % 85 %   Neutro Abs 7.2 1.7 - 7.7 K/uL   Lymphocytes Relative 5 %   Lymphs Abs 0.4 (L) 0.7 - 4.0 K/uL   Monocytes Relative 9 %   Monocytes Absolute 0.8 0.1 - 1.0 K/uL   Eosinophils Relative 0 %   Eosinophils Absolute 0.0 0.0 - 0.5 K/uL   Basophils Relative 0 %   Basophils Absolute 0.0 0.0 - 0.1 K/uL   Immature Granulocytes 1 %   Abs Immature Granulocytes 0.04 0.00 - 0.07 K/uL  Comprehensive metabolic panel  Result Value Ref Range   Sodium 134 (L) 135 - 145 mmol/L   Potassium 3.9 3.5 - 5.1 mmol/L   Chloride 98 98 - 111 mmol/L   CO2 24 22 - 32 mmol/L   Glucose, Bld 339 (H) 70 - 99 mg/dL   BUN 8 6 - 20 mg/dL   Creatinine, Ser 0.62 0.44 - 1.00  mg/dL   Calcium 9.4 8.9 - 10.3 mg/dL   Total Protein 8.1 6.5 - 8.1 g/dL   Albumin 3.5 3.5 - 5.0 g/dL   AST 25 15 - 41 U/L   ALT 12 0 - 44 U/L   Alkaline Phosphatase 91 38 - 126 U/L   Total Bilirubin 1.7 (H) 0.3 - 1.2 mg/dL   GFR, Estimated >60 >60 mL/min   Anion gap 12 5 - 15  Urinalysis, Routine w reflex microscopic Urine, Clean Catch  Result Value Ref Range   Color, Urine YELLOW YELLOW   APPearance CLOUDY (A) CLEAR   Specific Gravity, Urine 1.022 1.005 - 1.030   pH 6.0 5.0 - 8.0   Glucose, UA >=500 (A) NEGATIVE mg/dL   Hgb urine dipstick MODERATE (A) NEGATIVE   Bilirubin Urine NEGATIVE NEGATIVE   Ketones, ur 80 (A) NEGATIVE mg/dL   Protein, ur 100 (A) NEGATIVE mg/dL   Nitrite POSITIVE (A) NEGATIVE   Leukocytes,Ua LARGE (A) NEGATIVE   RBC / HPF 11-20 0 - 5 RBC/hpf   WBC, UA >50 (H) 0 - 5 WBC/hpf   Bacteria, UA RARE (A)  NONE SEEN   Squamous Epithelial / LPF 11-20 0 - 5  I-Stat Beta hCG blood, ED ([facility], [facility], [facility] only)  Result Value Ref Range   I-stat hCG, quantitative <5.0 <5 mIU/mL   Comment 3           DG Chest Port 1 View  Result Date: 03/20/2021 CLINICAL DATA:  Chest pain EXAM: PORTABLE CHEST 1 VIEW COMPARISON:  08/22/2019 FINDINGS: Single frontal view of the chest demonstrates an unremarkable cardiac silhouette. No acute airspace disease, effusion, or pneumothorax. No acute bony abnormalities. IMPRESSION: 1. No acute intrathoracic process. Electronically Signed   By: Randa Ngo M.D.   On: 03/20/2021 00:42   CT Renal Stone Study  Result Date: 03/21/2021 CLINICAL DATA:  UTI, flank pain EXAM: CT ABDOMEN AND PELVIS WITHOUT CONTRAST TECHNIQUE: Multidetector CT imaging of the abdomen and pelvis was performed following the standard protocol without IV contrast. COMPARISON:  10/31/2020 FINDINGS: Lower chest: No acute abnormality. Hepatobiliary: No focal liver abnormality is seen. No gallstones, gallbladder wall thickening, or biliary dilatation. Pancreas: Unremarkable. No pancreatic  ductal dilatation or surrounding inflammatory changes. Spleen: Normal in size without focal abnormality. Adrenals/Urinary Tract: Adrenal glands are unremarkable. Kidneys are normal, without renal calculi, focal lesion, or hydronephrosis. Bladder is unremarkable. Stomach/Bowel: Fluid-filled stomach. No bowel dilatation to suggest obstruction. No bowel wall thickening. Moderate amount of stool in the ascending and transverse colon. No pneumatosis, pneumoperitoneum or portal venous gas. Vascular/Lymphatic: No significant vascular findings are present. No enlarged abdominal or pelvic lymph nodes. Reproductive: Normal uterus. Intrauterine device in the uterus. 4 cm cystic right adnexal mass likely rising from the right ovary. No further evaluation recommended at this time. Other: No abdominal wall hernia or abnormality. No abdominopelvic ascites. Musculoskeletal: No acute osseous abnormality. No aggressive osseous lesion IMPRESSION: 1. Fluid-filled stomach which may reflect recently ingested fluid versus gastroenteritis. 2. No urolithiasis or obstructive uropathy. 3. Moderate amount of stool in the ascending and transverse colon. Electronically Signed   By: Kathreen Devoid   On: 03/21/2021 15:30      Garald Balding, PA-C 03/21/21 [place], [place], DO 03/21/21 2153

## 2021-03-22 DIAGNOSIS — E1100 Type 2 diabetes mellitus with hyperosmolarity without nonketotic hyperglycemic-hyperosmolar coma (NKHHC): Secondary | ICD-10-CM

## 2021-03-22 DIAGNOSIS — K219 Gastro-esophageal reflux disease without esophagitis: Secondary | ICD-10-CM

## 2021-03-22 DIAGNOSIS — I10 Essential (primary) hypertension: Secondary | ICD-10-CM

## 2021-03-22 DIAGNOSIS — E876 Hypokalemia: Secondary | ICD-10-CM

## 2021-03-22 LAB — COMPREHENSIVE METABOLIC PANEL
ALT: 9 U/L (ref 0–44)
AST: 11 U/L — ABNORMAL LOW (ref 15–41)
Albumin: 3.1 g/dL — ABNORMAL LOW (ref 3.5–5.0)
Alkaline Phosphatase: 71 U/L (ref 38–126)
Anion gap: 7 (ref 5–15)
BUN: 6 mg/dL (ref 6–20)
CO2: 30 mmol/L (ref 22–32)
Calcium: 8.8 mg/dL — ABNORMAL LOW (ref 8.9–10.3)
Chloride: 102 mmol/L (ref 98–111)
Creatinine, Ser: 0.5 mg/dL (ref 0.44–1.00)
GFR, Estimated: >60 mL/min (ref >60)
Glucose, Bld: 160 mg/dL — ABNORMAL HIGH (ref 70–99)
Potassium: 3.1 mmol/L — ABNORMAL LOW (ref 3.5–5.1)
Sodium: 139 mmol/L (ref 135–145)
Total Bilirubin: 0.7 mg/dL (ref 0.3–1.2)
Total Protein: 7.2 g/dL (ref 6.5–8.1)

## 2021-03-22 LAB — CBC
HCT: 42.4 % (ref 36.0–46.0)
Hemoglobin: 13.6 g/dL (ref 12.0–15.0)
MCH: 27.6 pg (ref 26.0–34.0)
MCHC: 32.1 g/dL (ref 30.0–36.0)
MCV: 86.2 fL (ref 80.0–100.0)
Platelets: 258 10*3/uL (ref 150–400)
RBC: 4.92 MIL/uL (ref 3.87–5.11)
RDW: 14.6 % (ref 11.5–15.5)
WBC: 6.4 10*3/uL (ref 4.0–10.5)
nRBC: 0 % (ref 0.0–0.2)

## 2021-03-22 LAB — GLUCOSE, CAPILLARY
Glucose-Capillary: 166 mg/dL — ABNORMAL HIGH (ref 70–99)
Glucose-Capillary: 121 mg/dL — ABNORMAL HIGH (ref 70–99)
Glucose-Capillary: 191 mg/dL — ABNORMAL HIGH (ref 70–99)
Glucose-Capillary: 229 mg/dL — ABNORMAL HIGH (ref 70–99)

## 2021-03-22 MED ORDER — POTASSIUM CHLORIDE CRYS ER 20 MEQ PO TBCR
40.0000 meq | EXTENDED_RELEASE_TABLET | Freq: Once | ORAL | Status: AC
  Administered 2021-03-22: 40 meq via ORAL
  Filled 2021-03-22: qty 2

## 2021-03-22 MED ORDER — POLYETHYLENE GLYCOL 3350 17 G PO PACK
17.0000 g | PACK | Freq: Every day | ORAL | Status: DC
  Administered 2021-03-22 – 2021-03-25 (×4): 17 g via ORAL
  Filled 2021-03-22 (×4): qty 1

## 2021-03-22 MED ORDER — HYDROMORPHONE HCL 1 MG/ML IJ SOLN
1.0000 mg | INTRAMUSCULAR | Status: DC | PRN
  Administered 2021-03-22 – 2021-03-23 (×4): 1 mg via INTRAVENOUS
  Filled 2021-03-22 (×4): qty 1

## 2021-03-22 MED ORDER — DIPHENHYDRAMINE HCL 25 MG PO CAPS
25.0000 mg | ORAL_CAPSULE | Freq: Four times a day (QID) | ORAL | Status: DC | PRN
  Administered 2021-03-22 – 2021-03-23 (×3): 25 mg via ORAL
  Filled 2021-03-22 (×3): qty 1

## 2021-03-22 MED ORDER — INSULIN DETEMIR 100 UNIT/ML ~~LOC~~ SOLN
20.0000 [IU] | Freq: Every day | SUBCUTANEOUS | Status: DC
  Administered 2021-03-23: 20 [IU] via SUBCUTANEOUS
  Filled 2021-03-22: qty 0.2

## 2021-03-22 NOTE — Plan of Care (Signed)

## 2021-03-22 NOTE — Progress Notes (Signed)
PROGRESS NOTE    Jodi Mcgee  JAS:[id number] DOB: 05/02/1986 DOA: 03/21/2021 PCP: [facility]    Brief Narrative:  Jodi Mcgee was admitted to the hospital with the working diagnosis of pyelonephritis.   35 year old female past medical history for type 2 diabetes mellitus, GERD and anxiety who presented with back pain/abdominal pain.  Reported severe low back pain for 24 hours, radiates to the left flank, associated with dark urine and dysuria.  Because of persistent and severe symptoms she came to the hospital for further evaluation.  On her initial physical examination blood pressure 145/95, heart rate 116, respiratory rate 17, oxygen saturation 98%, her lungs were clear to auscultation bilaterally, heart S1-S2, present, rhythmic, tachycardic abdomen nontender to palpation, no lower extremity edema.  Sodium 134, potassium 3.9, chloride 98, bicarb 24, glucose 339, BUN 8, creatinine 0.62, anion gap 12, white count 8.4, hemoglobin 14.1, hematocrit 42.9, platelets 241. SARS COVID-19 negative.  Urinalysis specific gravity 1.022, 100 protein, 11-20 red cells, > 50 white cells.  Renal stone study CT with fluid-filled stomach, no urolithiasis or obstructive uropathy.  Moderate amount of stool in the ascending and transverse colon.  Chest radiograph with right hemidiaphragm elevation.  EKG 92 bpm, normal axis, normal intervals, sinus rhythm, no ST segment T wave changes.  Assessment & Plan:   Principal Problem:   Pyelonephritis Active Problems:   Type 2 diabetes mellitus, without long-term current use of insulin (HCC)   Chronic hypertension   Hypokalemia   GERD (gastroesophageal reflux disease)   1. Acute pyelonephritis (not sepsis patient continue to have back pain despite IV morphine.  Mild nausea but not vomiting.  Wbc is 6,4 and patient has been afebrile.   Continue antibiotic therapy with IV ceftriaxone and IV fluids with isotonic saline  (decrease rate to 75 ml per H). Change morphine to hydromorphone for better pain control, continue with antiacids and antiemetics.   2. Hypokalemia, stable renal function with serum cr at 0,50, K is 3,1 and serum bicarbonate is 30,. Continue K correction with Kcl 40 meq x2 doses and follow up renal function in am, continue hydration with isotonic saline.   3. T2DM. Continue glucose cover and monitoring with insulin sliding scale. Decrease basal insulin to 20 units daily to prevent hypoglycemia.   4. HTN. Continue blood pressure control with amlodipine. Decrease IV fluids to 75 ml per H.  5. GERD. Continue with antiacid therapy.    Patient continue to be at high risk for worsening pyelonephritis.   Status is: Inpatient  Remains inpatient appropriate because:IV treatments appropriate due to intensity of illness or inability to take PO   Dispo: The patient is from: Home              Anticipated d/c is to: Home              Patient currently is not medically stable to d/c.   Difficult to place patient No   DVT prophylaxis: Enoxaparin   Code Status:   full  Family Communication:  No family at the bedside       Antimicrobials:   Ceftriaxone     Subjective: Patient is having persistent severe back pain, despite morphine, no vomiting but some nausea., no chest pain or dyspnea.   Objective: Vitals:   03/21/21 2009 03/22/21 0200 03/22/21 0510 03/22/21 0816  BP: (!) 162/86 (!) 138/99 (!) 160/98 (!) 154/106  Pulse: 100 (!) 103 97 90  Resp: 17 17 17  16  Temp: 98.4 F (36.9 C) 98.5 F (36.9 C) 98 F (36.7 C) 98.5 F (36.9 C)  TempSrc: Oral Oral Oral Oral  SpO2: 98% 100% 100% 100%  Weight:      Height:        Intake/Output Summary (Last 24 hours) at 03/22/2021 1222 Last data filed at 03/22/2021 0900 Gross per 24 hour  Intake 2992.53 ml  Output --  Net 2992.53 ml   Filed Weights   03/21/21 0846  Weight: 63.5 kg    Examination:   General: Not in pain or dyspnea,  deconditioned  Neurology: Awake and alert, non focal  E ENT: no pallor, no icterus, oral mucosa moist Cardiovascular: No JVD. S1-S2 present, rhythmic, no gallops, rubs, or murmurs. No lower extremity edema. Pulmonary: positive breath sounds bilaterally, Gastrointestinal. Abdomen soft and non tender. Positive bilateral costovertebral tenderness.  Skin. No rashes Musculoskeletal: no joint deformities     Data Reviewed: I have personally reviewed following labs and imaging studies  CBC: Recent Labs  Lab 03/20/21 0100 03/21/21 0859 03/22/21 0253  WBC 6.8 8.4 6.4  NEUTROABS 5.8 7.2  --   HGB 14.4 14.1 13.6  HCT 43.9 42.9 42.4  MCV 83.6 84.3 86.2  PLT 307 241 102   Basic Metabolic Panel: Recent Labs  Lab 03/20/21 0100 03/21/21 0859 03/21/21 1533 03/21/21 1823 03/22/21 0253  NA 136 134*  --   --  139  K 2.6* 3.9 3.2*  --  3.1*  CL 97* 98  --   --  102  CO2 25 24  --   --  30  GLUCOSE 369* 339*  --   --  160*  BUN 10 8  --   --  6  CREATININE 0.55 0.62  --   --  0.50  CALCIUM 9.3 9.4  --   --  8.8*  MG  --   --   --  1.6*  --    GFR: Estimated Creatinine Clearance: 95.4 mL/min (by C-G formula based on SCr of 0.5 mg/dL). Liver Function Tests: Recent Labs  Lab 03/21/21 0859 03/21/21 1823 03/22/21 0253  AST 25  --  11*  ALT 12  --  9  ALKPHOS 91  --  71  BILITOT 1.7* 1.1 0.7  PROT 8.1  --  7.2  ALBUMIN 3.5  --  3.1*   No results for input(s): LIPASE, AMYLASE in the last 168 hours. No results for input(s): AMMONIA in the last 168 hours. Coagulation Profile: No results for input(s): INR, PROTIME in the last 168 hours. Cardiac Enzymes: No results for input(s): CKTOTAL, CKMB, CKMBINDEX, TROPONINI in the last 168 hours. BNP (last 3 results) No results for input(s): PROBNP in the last 8760 hours. HbA1C: No results for input(s): HGBA1C in the last 72 hours. CBG: Recent Labs  Lab 03/20/21 0444 03/21/21 2005 03/21/21 2243 03/22/21 0742 03/22/21 1133  GLUCAP  318* 300* 193* 166* 121*   Lipid Profile: No results for input(s): CHOL, HDL, LDLCALC, TRIG, CHOLHDL, LDLDIRECT in the last 72 hours. Thyroid Function Tests: No results for input(s): TSH, T4TOTAL, FREET4, T3FREE, THYROIDAB in the last 72 hours. Anemia Panel: No results for input(s): VITAMINB12, FOLATE, FERRITIN, TIBC, IRON, RETICCTPCT in the last 72 hours.    Radiology Studies: I have reviewed all of the imaging during this hospital visit personally     Scheduled Meds: . amLODipine  5 mg Oral Daily  . insulin aspart  0-20 Units Subcutaneous TID WC  . insulin aspart  0-5 Units Subcutaneous QHS  . insulin detemir  20 Units Subcutaneous BID  . potassium chloride  40 mEq Oral BID   Continuous Infusions: . sodium chloride 100 mL/hr at 03/22/21 0840  . cefTRIAXone (ROCEPHIN)  IV 2 g (03/22/21 1002)     LOS: 1 day        Arlinda Barcelona Gerome Apley, MD

## 2021-03-23 LAB — GLUCOSE, CAPILLARY
Glucose-Capillary: 339 mg/dL — ABNORMAL HIGH (ref 70–99)
Glucose-Capillary: 81 mg/dL (ref 70–99)
Glucose-Capillary: 197 mg/dL — ABNORMAL HIGH (ref 70–99)
Glucose-Capillary: 257 mg/dL — ABNORMAL HIGH (ref 70–99)

## 2021-03-23 LAB — CBC WITH DIFFERENTIAL/PLATELET
Abs Immature Granulocytes: 0.01 10*3/uL (ref 0.00–0.07)
Basophils Absolute: 0 10*3/uL (ref 0.0–0.1)
Basophils Relative: 0 %
Eosinophils Absolute: 0.1 10*3/uL (ref 0.0–0.5)
Eosinophils Relative: 1 %
HCT: 36.3 % (ref 36.0–46.0)
Hemoglobin: 11.5 g/dL — ABNORMAL LOW (ref 12.0–15.0)
Immature Granulocytes: 0 %
Lymphocytes Relative: 24 %
Lymphs Abs: 0.9 10*3/uL (ref 0.7–4.0)
MCH: 27.8 pg (ref 26.0–34.0)
MCHC: 31.7 g/dL (ref 30.0–36.0)
MCV: 87.7 fL (ref 80.0–100.0)
Monocytes Absolute: 0.6 10*3/uL (ref 0.1–1.0)
Monocytes Relative: 15 %
Neutro Abs: 2.1 10*3/uL (ref 1.7–7.7)
Neutrophils Relative %: 60 %
Platelets: 211 10*3/uL (ref 150–400)
RBC: 4.14 MIL/uL (ref 3.87–5.11)
RDW: 14.6 % (ref 11.5–15.5)
WBC: 3.6 10*3/uL — ABNORMAL LOW (ref 4.0–10.5)
nRBC: 0 % (ref 0.0–0.2)

## 2021-03-23 LAB — URINE CULTURE: Culture: >=100000 — AB

## 2021-03-23 LAB — BASIC METABOLIC PANEL
Anion gap: 5 (ref 5–15)
BUN: 8 mg/dL (ref 6–20)
CO2: 30 mmol/L (ref 22–32)
Calcium: 8.4 mg/dL — ABNORMAL LOW (ref 8.9–10.3)
Chloride: 100 mmol/L (ref 98–111)
Creatinine, Ser: 0.58 mg/dL (ref 0.44–1.00)
GFR, Estimated: >60 mL/min (ref >60)
Glucose, Bld: 305 mg/dL — ABNORMAL HIGH (ref 70–99)
Potassium: 3.5 mmol/L (ref 3.5–5.1)
Sodium: 135 mmol/L (ref 135–145)

## 2021-03-23 LAB — HEMOGLOBIN A1C
Hgb A1c MFr Bld: 12.9 % — ABNORMAL HIGH (ref 4.8–5.6)
Mean Plasma Glucose: 324 mg/dL

## 2021-03-23 MED ORDER — INSULIN DETEMIR 100 UNIT/ML ~~LOC~~ SOLN
15.0000 [IU] | Freq: Two times a day (BID) | SUBCUTANEOUS | Status: DC
  Administered 2021-03-23 – 2021-03-25 (×4): 15 [IU] via SUBCUTANEOUS
  Filled 2021-03-23 (×4): qty 0.15

## 2021-03-23 MED ORDER — LORATADINE 10 MG PO TABS
10.0000 mg | ORAL_TABLET | Freq: Every day | ORAL | Status: DC
  Administered 2021-03-23 – 2021-03-25 (×3): 10 mg via ORAL
  Filled 2021-03-23 (×3): qty 1

## 2021-03-23 MED ORDER — CEFAZOLIN SODIUM-DEXTROSE 2-4 GM/100ML-% IV SOLN
2.0000 g | Freq: Three times a day (TID) | INTRAVENOUS | Status: DC
  Administered 2021-03-24: 2 g via INTRAVENOUS
  Filled 2021-03-23: qty 100

## 2021-03-23 MED ORDER — HYDROMORPHONE HCL 1 MG/ML IJ SOLN
1.0000 mg | Freq: Three times a day (TID) | INTRAMUSCULAR | Status: DC | PRN
Start: 2021-03-23 — End: 2021-03-24
  Administered 2021-03-23 – 2021-03-24 (×2): 1 mg via INTRAVENOUS
  Filled 2021-03-23 (×2): qty 1

## 2021-03-23 NOTE — Progress Notes (Signed)
PROGRESS NOTE    Jodi Mcgee  IEP:[id number] DOB: [birth year] DOA: 03/21/2021 PCP: [facility]    Brief Narrative:  Mrs. Centanni was admitted to the hospital with the working diagnosis of pyelonephritis.   35 year old female past medical history for type 2 diabetes mellitus, GERD and anxiety who presented with back pain/abdominal pain.  Reported severe low back pain for 24 hours, radiates to the left flank, associated with dark urine and dysuria.  Because of persistent and severe symptoms she came to the hospital for further evaluation.  On her initial physical examination blood pressure 145/95, heart rate 116, respiratory rate 17, oxygen saturation 98%, her lungs were clear to auscultation bilaterally, heart S1-S2, present, rhythmic, tachycardic abdomen nontender to palpation, no lower extremity edema.  Sodium 134, potassium 3.9, chloride 98, bicarb 24, glucose 339, BUN 8, creatinine 0.62, anion gap 12, white count 8.4, hemoglobin 14.1, hematocrit 42.9, platelets 241. SARS COVID-19 negative.  Urinalysis specific gravity 1.022, 100 protein, 11-20 red cells, > 50 white cells.  Renal stone study CT with fluid-filled stomach, no urolithiasis or obstructive uropathy.  Moderate amount of stool in the ascending and transverse colon.  Chest radiograph with right hemidiaphragm elevation.  EKG 92 bpm, normal axis, normal intervals, sinus rhythm, no ST segment T wave changes.  Patient placed on IV fluids, IV analgesics and IV antibiotic therapy with ceftriaxone.   Urine culture positive for E coli, pan sensitive, change to Cefazolin.    Assessment & Plan:   Principal Problem:   Pyelonephritis Active Problems:   Type 2 diabetes mellitus, without long-term current use of insulin (HCC)   Chronic hypertension   Hypokalemia   GERD (gastroesophageal reflux disease)    1. Acute pyelonephritis (not sepsis). Back pain improved with analgesic, continue  to have nausea but not vomiting. Improved appetite.  Wbc is 3,6 this am. Urine culture positive for E coli pan sensitive.    Plan to narrow antibiotic therapy to Cefazolin. Continue pain control with oxycodone and hydromorphone, try to give oxycodone first and limit IV opiates. Decrease frequency of IV hydromorphone to q 8 hrs as needed.  Out of bed to chair tid with meals.  Discontinue benadryl and add loratadine  2. Hypokalemia.  Patient with nausea but not vomiting, K is 3,5 with serum cr at 0.58 and bicarbonate at 30. Will continue close monitoring of renal function and electrolytes.   3. T2DM. Uncontrolled hyperglycemia. Fasting glucose this am is 305. Increase basal insulin to 15 units bid (continue with poor oral intake), continue with insulin sliding scale.  4. HTN. Blood pressure control with amlodipine, discontinue IV fluids.   5. GERD. On pantoprazole    Status is: Inpatient  Remains inpatient appropriate because:IV treatments appropriate due to intensity of illness or inability to take PO   Dispo: The patient is from: Home              Anticipated d/c is to: Home              Patient currently is not medically stable to d/c. if no further nausea and improved back pain, plan for dc home on 06/02.    Difficult to place patient No   DVT prophylaxis: Enoxaparin   Code Status:   full  Family Communication:   no family at the bedside      Antimicrobials:   Cefazolin     Subjective: Patient with nausea but not vomiting, continue to have back pain, that is  improved with analgesics, positive pruritus with IV hydromorphone and requesting IV benadryl.   Objective: Vitals:   03/22/21 0816 03/22/21 1341 03/22/21 2048 03/23/21 0517  BP: (!) 154/106 119/78 (!) 133/98 (!) 123/92  Pulse: 90 97 90 79  Resp: 16 18 20  (!) 22  Temp: 98.5 F (36.9 C) 98.8 F (37.1 C) 99.2 F (37.3 C) 98.5 F (36.9 C)  TempSrc: Oral Oral  Oral  SpO2: 100% 100% 100% 100%  Weight:       Height:        Intake/Output Summary (Last 24 hours) at 03/23/2021 1330 Last data filed at 03/23/2021 1235 Gross per 24 hour  Intake 3653.29 ml  Output --  Net 3653.29 ml   Filed Weights   03/21/21 0846  Weight: 63.5 kg    Examination:   General: Not in pain or dyspnea Neurology: Awake and alert, non focal  E ENT: mild pallor, no icterus, oral mucosa moist Cardiovascular: No JVD. S1-S2 present, rhythmic, no gallops, rubs, or murmurs. No lower extremity edema. Pulmonary: positive breath sounds bilaterally, adequate air movement, no wheezing, rhonchi or rales. Gastrointestinal. Abdomen soft and non tender Skin. No rashes Musculoskeletal: no joint deformities     Data Reviewed: I have personally reviewed following labs and imaging studies  CBC: Recent Labs  Lab 03/20/21 0100 03/21/21 0859 03/22/21 0253 03/23/21 0315  WBC 6.8 8.4 6.4 3.6*  NEUTROABS 5.8 7.2  --  2.1  HGB 14.4 14.1 13.6 11.5*  HCT 43.9 42.9 42.4 36.3  MCV 83.6 84.3 86.2 87.7  PLT 307 241 258 989   Basic Metabolic Panel: Recent Labs  Lab 03/20/21 0100 03/21/21 0859 03/21/21 1533 03/21/21 1823 03/22/21 0253 03/23/21 0315  NA 136 134*  --   --  139 135  K 2.6* 3.9 3.2*  --  3.1* 3.5  CL 97* 98  --   --  102 100  CO2 25 24  --   --  30 30  GLUCOSE 369* 339*  --   --  160* 305*  BUN 10 8  --   --  6 8  CREATININE 0.55 0.62  --   --  0.50 0.58  CALCIUM 9.3 9.4  --   --  8.8* 8.4*  MG  --   --   --  1.6*  --   --    GFR: Estimated Creatinine Clearance: 95.4 mL/min (by C-G formula based on SCr of 0.58 mg/dL). Liver Function Tests: Recent Labs  Lab 03/21/21 0859 03/21/21 1823 03/22/21 0253  AST 25  --  11*  ALT 12  --  9  ALKPHOS 91  --  71  BILITOT 1.7* 1.1 0.7  PROT 8.1  --  7.2  ALBUMIN 3.5  --  3.1*   No results for input(s): LIPASE, AMYLASE in the last 168 hours. No results for input(s): AMMONIA in the last 168 hours. Coagulation Profile: No results for input(s): INR, PROTIME in  the last 168 hours. Cardiac Enzymes: No results for input(s): CKTOTAL, CKMB, CKMBINDEX, TROPONINI in the last 168 hours. BNP (last 3 results) No results for input(s): PROBNP in the last 8760 hours. HbA1C: Recent Labs    03/21/21 1823  HGBA1C 12.9*   CBG: Recent Labs  Lab 03/22/21 1133 03/22/21 1653 03/22/21 2050 03/23/21 0709 03/23/21 1223  GLUCAP 121* 191* 229* 339* 81   Lipid Profile: No results for input(s): CHOL, HDL, LDLCALC, TRIG, CHOLHDL, LDLDIRECT in the last 72 hours. Thyroid Function Tests: No results for  input(s): TSH, T4TOTAL, FREET4, T3FREE, THYROIDAB in the last 72 hours. Anemia Panel: No results for input(s): VITAMINB12, FOLATE, FERRITIN, TIBC, IRON, RETICCTPCT in the last 72 hours.    Radiology Studies: I have reviewed all of the imaging during this hospital visit personally     Scheduled Meds: . amLODipine  5 mg Oral Daily  . insulin aspart  0-20 Units Subcutaneous TID WC  . insulin aspart  0-5 Units Subcutaneous QHS  . insulin detemir  20 Units Subcutaneous Daily  . polyethylene glycol  17 g Oral Daily   Continuous Infusions: . sodium chloride 75 mL/hr at 03/23/21 1235  . [START ON 03/24/2021]  ceFAZolin (ANCEF) IV       LOS: 2 days        Ashia Dehner Gerome Apley, MD

## 2021-03-23 NOTE — Plan of Care (Signed)
  Problem: Coping: Goal: Level of anxiety will decrease Outcome: Progressing   Problem: Elimination: Goal: Will not experience complications related to bowel motility Outcome: Progressing Goal: Will not experience complications related to urinary retention Outcome: Progressing   Problem: Pain Managment: Goal: General experience of comfort will improve Outcome: Progressing   

## 2021-03-23 NOTE — Progress Notes (Addendum)
Inpatient Diabetes Program Recommendations  AACE/ADA: New Consensus Statement on Inpatient Glycemic Control (2015)  Target Ranges:  Prepandial:   less than 140 mg/dL      Peak postprandial:   less than 180 mg/dL (1-2 hours)      Critically ill patients:  140 - 180 mg/dL   Lab Results  Component Value Date   GLUCAP 339 (H) 03/23/2021   HGBA1C 12.9 (H) 03/21/2021    Review of Glycemic Control Results for Jodi Mcgee, Jodi Mcgee (MRN [id number]) as of 03/23/2021 09:18  Ref. Range 03/22/2021 07:42 03/22/2021 11:33 03/22/2021 16:53 03/22/2021 20:50 03/23/2021 07:09  Glucose-Capillary Latest Ref Range: 70 - 99 mg/dL 166 (H) 121 (H) 191 (H) 229 (H) 339 (H)   Diabetes history: DM 2 Outpatient Diabetes medications: Levemir 20 units tid, Novolog 0-20 units tid Current orders for Inpatient glycemic control:  Levemir 20 units Daily Novolog 0-20 units tid + hs  A1c 12.9% on 5/30  Inpatient Diabetes Program Recommendations:    -  Increase Levemir to 20 units bid  Will see pt today.   Addendum 1035 am: Spoke with pt at bedside regarding A1c level of 12.9%. pt reports sometimes the pharmacy does not have her insulin and at times she needed a pre authorization for it. Pt reports not issues the last time she picked up her refill. Pt reports drinking juices and sodas after lunch and that is why her doctor said to take Levemir tid. Pt has had a lot of social issues going on with the passing on her sister and change in job schedule. She has an appointment with an Endocrinologist with [place] [facility] in the near future. Discussed dietary changes for future health and better glycemic control.   Thanks,  Tama Headings RN, MSN, BC-ADM Inpatient Diabetes Coordinator Team Pager [phone] (8a-5p)

## 2021-03-23 NOTE — Plan of Care (Signed)

## 2021-03-24 LAB — BASIC METABOLIC PANEL
Anion gap: 7 (ref 5–15)
BUN: 17 mg/dL (ref 6–20)
CO2: 29 mmol/L (ref 22–32)
Calcium: 8.7 mg/dL — ABNORMAL LOW (ref 8.9–10.3)
Chloride: 101 mmol/L (ref 98–111)
Creatinine, Ser: 0.6 mg/dL (ref 0.44–1.00)
GFR, Estimated: >60 mL/min (ref >60)
Glucose, Bld: 182 mg/dL — ABNORMAL HIGH (ref 70–99)
Potassium: 3.5 mmol/L (ref 3.5–5.1)
Sodium: 137 mmol/L (ref 135–145)

## 2021-03-24 LAB — CBC WITH DIFFERENTIAL/PLATELET
Abs Immature Granulocytes: 0.03 10*3/uL (ref 0.00–0.07)
Basophils Absolute: 0 10*3/uL (ref 0.0–0.1)
Basophils Relative: 0 %
Eosinophils Absolute: 0.1 10*3/uL (ref 0.0–0.5)
Eosinophils Relative: 1 %
HCT: 34.2 % — ABNORMAL LOW (ref 36.0–46.0)
Hemoglobin: 10.7 g/dL — ABNORMAL LOW (ref 12.0–15.0)
Immature Granulocytes: 1 %
Lymphocytes Relative: 26 %
Lymphs Abs: 1.2 10*3/uL (ref 0.7–4.0)
MCH: 27.2 pg (ref 26.0–34.0)
MCHC: 31.3 g/dL (ref 30.0–36.0)
MCV: 87 fL (ref 80.0–100.0)
Monocytes Absolute: 0.6 10*3/uL (ref 0.1–1.0)
Monocytes Relative: 13 %
Neutro Abs: 2.7 10*3/uL (ref 1.7–7.7)
Neutrophils Relative %: 59 %
Platelets: 218 10*3/uL (ref 150–400)
RBC: 3.93 MIL/uL (ref 3.87–5.11)
RDW: 14.5 % (ref 11.5–15.5)
WBC: 4.6 10*3/uL (ref 4.0–10.5)
nRBC: 0 % (ref 0.0–0.2)

## 2021-03-24 LAB — GLUCOSE, CAPILLARY
Glucose-Capillary: 274 mg/dL — ABNORMAL HIGH (ref 70–99)
Glucose-Capillary: 149 mg/dL — ABNORMAL HIGH (ref 70–99)
Glucose-Capillary: 241 mg/dL — ABNORMAL HIGH (ref 70–99)
Glucose-Capillary: 214 mg/dL — ABNORMAL HIGH (ref 70–99)

## 2021-03-24 MED ORDER — CYCLOBENZAPRINE HCL 5 MG PO TABS
5.0000 mg | ORAL_TABLET | Freq: Three times a day (TID) | ORAL | Status: DC
  Administered 2021-03-24 – 2021-03-25 (×4): 5 mg via ORAL
  Filled 2021-03-24 (×4): qty 1

## 2021-03-24 MED ORDER — CEPHALEXIN 500 MG PO CAPS
500.0000 mg | ORAL_CAPSULE | Freq: Three times a day (TID) | ORAL | Status: DC
  Administered 2021-03-24 – 2021-03-25 (×3): 500 mg via ORAL
  Filled 2021-03-24 (×3): qty 1

## 2021-03-24 MED ORDER — ACETAMINOPHEN 325 MG PO TABS
650.0000 mg | ORAL_TABLET | Freq: Four times a day (QID) | ORAL | Status: DC
  Administered 2021-03-24 – 2021-03-25 (×4): 650 mg via ORAL
  Filled 2021-03-24 (×4): qty 2

## 2021-03-24 MED ORDER — HYDROMORPHONE HCL 1 MG/ML IJ SOLN
1.0000 mg | Freq: Four times a day (QID) | INTRAMUSCULAR | Status: DC | PRN
  Administered 2021-03-24 – 2021-03-25 (×4): 1 mg via INTRAVENOUS
  Filled 2021-03-24 (×5): qty 1

## 2021-03-24 NOTE — Discharge Summary (Signed)
Physician Discharge Summary  Jodi Mcgee ZMO:[id number] DOB: Nov 01, 1985 DOA: 03/21/2021  PCP: [facility]  Admit date: 03/21/2021 Discharge date: 03/25/2021  Admitted From: Home  Disposition:  Home   Recommendations for Outpatient Follow-up and new medication changes:  1. Follow up with [facility] [place] in 7 to 10 days.  2. Continue antibiotic therapy with Cephalexin for 3 more days.  3. Added flexeril as needed for back muscle spasms.   Home Health: no   Equipment/Devices: no    Discharge Condition: stable  CODE STATUS: full  Diet recommendation: heart health and diabetic prudent.   Brief/Interim Summary: Jodi Mcgee was admitted to the hospital with the working diagnosis of pyelonephritis.  35 year old female past medical history for type 2 diabetes mellitus, GERD and anxiety who presented with back pain/abdominal pain. Reported severe low back pain for 24 hours, radiates to the left flank, associated with dark urine and dysuria. Because of persistent and severe symptoms she came to the hospital for further evaluation. On her initial physical examination blood pressure 145/95, heart rate 116, respiratory rate 17, oxygen saturation 98%, her lungs were clear to auscultation bilaterally, heart S1-S2, present, rhythmic, tachycardic abdomen nontender to palpation, no lower extremity edema. Positive costovertebral angle tenderness.  Sodium 134, potassium 3.9, chloride 98, bicarb 24, glucose 339, BUN 8, creatinine 0.62, anion gap 12, white count 8.4, hemoglobin 14.1, hematocrit 42.9, platelets 241. SARS COVID-19 negative.  Urinalysis specific gravity 1.022, 100 protein, 11-20 red cells,>50 white cells.  Renal stone study CT with fluid-filled stomach, no urolithiasis or obstructive uropathy. Moderate amount of stool in the ascending and transverse colon.  Chest radiograph with right hemidiaphragm elevation.  EKG 92 bpm, normal  axis, normal intervals, sinus rhythm, no ST segment T wave changes.  Patient placed on IV fluids, IV analgesics and IV antibiotic therapy with ceftriaxone.   Urine culture positive for E coli, pan sensitive, change to IV Cefazolin.   Clinical improvement, transitioned to oral Cephalexin for 3 more days.   1. Acute pyelonephritis, E coli pan-sensitive (no sepsis). Patient was admitted to the medical war, responded well to antibiotic therapy.  Antibiotic therapy was narrowed with good toleration, she will continue taking cephalexin for 3 more days. Follow up as outpatient.   For back pain added flexeril for muscle contractions with improvement of her back pain,.   2. Hypokalemia. Renal function remained stable, K was corrected with Kcl with good toleration. At discharge her renal function showed a serum cr of 0,60, K 3,5, Na 137 and bicarbonate at 29.  Patient now tolerating po well.  3. T2Dm with uncontrolled hyperglycemia. Patient was placed on insulin sliding scale and basal insulin. Her glucose remained elevated. At home will resume her usual dose of insulin and plan to follow up as outpatient.   4. HTN. Continue blood pressure control with amlodipine.   5. GERD/ Constipation. Continue proton pump inhibitors, bowel regime with miralax and as needed dulcolax.    Discharge Diagnoses:  Principal Problem:   Pyelonephritis Active Problems:   Type 2 diabetes mellitus, without long-term current use of insulin (HCC)   Chronic hypertension   Hypokalemia   GERD (gastroesophageal reflux disease)    Discharge Instructions   Allergies as of 03/25/2021      Reactions   Hydrocodone Itching   Cetirizine & Related Itching, Swelling   Toradol [ketorolac Tromethamine] Hives, Itching   Flagyl [metronidazole] Itching, Swelling   Contrast Media [iodinated Diagnostic Agents] Itching, Other (See Comments)  Mild itching after IV contrast on 06/01/2017No urticaria visible, wheezing, or  angioedema   Nsaids Rash      Medication List    STOP taking these medications   Hyoscyamine Sulfate SL 0.125 MG Subl Commonly known as: Levsin/SL   omeprazole 40 MG capsule Commonly known as: PRILOSEC   ondansetron 4 MG disintegrating tablet Commonly known as: Zofran ODT   potassium chloride SA 20 MEQ tablet Commonly known as: KLOR-CON   promethazine 25 MG suppository Commonly known as: PHENERGAN   sucralfate 1 g tablet Commonly known as: Carafate     TAKE these medications   acetaminophen 325 MG tablet Commonly known as: TYLENOL Take 650 mg by mouth every 6 (six) hours as needed for mild pain, fever or headache.   bisacodyl 5 MG EC tablet Commonly known as: DULCOLAX Take 1 tablet (5 mg total) by mouth daily as needed for moderate constipation or severe constipation.   blood glucose meter kit and supplies Kit Dispense based on patient and insurance preference. Use up to four times daily as directed. (FOR ICD-9 250.00, 250.01).   cephALEXin 500 MG capsule Commonly known as: KEFLEX Take 1 capsule (500 mg total) by mouth every 8 (eight) hours for 3 days.   cyclobenzaprine 5 MG tablet Commonly known as: FLEXERIL Take 1 tablet (5 mg total) by mouth 3 (three) times daily as needed for muscle spasms.   HumuLIN R 100 units/mL injection Generic drug: insulin regular Inject 0-20 Units into the skin 3 (three) times daily before meals. Per sliding scale   insulin detemir 100 unit/ml Soln Commonly known as: LEVEMIR Inject 20 Units into the skin 3 (three) times daily.   levonorgestrel 19.5 MCG/DAY Iud IUD Commonly known as: LILETTA 1 each by Intrauterine route continuous. Placed in 2019   oxyCODONE-acetaminophen 5-325 MG tablet Commonly known as: PERCOCET/ROXICET Take 1 tablet by mouth every 6 (six) hours as needed.   polyethylene glycol 17 g packet Commonly known as: MIRALAX / GLYCOLAX Take 17 g by mouth daily. Start taking on: March 26, 2021       Follow-up  Information    [facility] Follow up in 1 week(s).   Specialty: Family Medicine Contact information: [address] [place] [address] [phone]              Allergies  Allergen Reactions  . Hydrocodone Itching  . Cetirizine & Related Itching and Swelling  . Toradol [Ketorolac Tromethamine] Hives and Itching  . Flagyl [Metronidazole] Itching and Swelling  . Contrast Media [Iodinated Diagnostic Agents] Itching and Other (See Comments)    Mild itching after IV contrast on 06/01/2017No urticaria visible, wheezing, or angioedema   . Nsaids Rash      Procedures/Studies: DG Chest Port 1 View  Result Date: 03/20/2021 CLINICAL DATA:  Chest pain EXAM: PORTABLE CHEST 1 VIEW COMPARISON:  08/22/2019 FINDINGS: Single frontal view of the chest demonstrates an unremarkable cardiac silhouette. No acute airspace disease, effusion, or pneumothorax. No acute bony abnormalities. IMPRESSION: 1. No acute intrathoracic process. Electronically Signed   By: Randa Ngo M.D.   On: 03/20/2021 00:42   CT Renal Stone Study  Result Date: 03/21/2021 CLINICAL DATA:  UTI, flank pain EXAM: CT ABDOMEN AND PELVIS WITHOUT CONTRAST TECHNIQUE: Multidetector CT imaging of the abdomen and pelvis was performed following the standard protocol without IV contrast. COMPARISON:  10/31/2020 FINDINGS: Lower chest: No acute abnormality. Hepatobiliary: No focal liver abnormality is seen. No gallstones, gallbladder wall  thickening, or biliary dilatation. Pancreas: Unremarkable. No pancreatic ductal dilatation or surrounding inflammatory changes. Spleen: Normal in size without focal abnormality. Adrenals/Urinary Tract: Adrenal glands are unremarkable. Kidneys are normal, without renal calculi, focal lesion, or hydronephrosis. Bladder is unremarkable. Stomach/Bowel: Fluid-filled stomach. No bowel dilatation to suggest obstruction. No bowel wall thickening. Moderate amount of  stool in the ascending and transverse colon. No pneumatosis, pneumoperitoneum or portal venous gas. Vascular/Lymphatic: No significant vascular findings are present. No enlarged abdominal or pelvic lymph nodes. Reproductive: Normal uterus. Intrauterine device in the uterus. 4 cm cystic right adnexal mass likely rising from the right ovary. No further evaluation recommended at this time. Other: No abdominal wall hernia or abnormality. No abdominopelvic ascites. Musculoskeletal: No acute osseous abnormality. No aggressive osseous lesion IMPRESSION: 1. Fluid-filled stomach which may reflect recently ingested fluid versus gastroenteritis. 2. No urolithiasis or obstructive uropathy. 3. Moderate amount of stool in the ascending and transverse colon. Electronically Signed   By: Kathreen Devoid   On: 03/21/2021 15:30      Subjective: Patient is feeling better, her back pain has improved, no further nausea or vomiting, no chest pain or dyspnea.   Discharge Exam: Vitals:   03/23/21 2053 03/24/21 0535  BP: 128/82 130/86  Pulse: 91 77  Resp: 16 16  Temp: 98.4 F (36.9 C) 98 F (36.7 C)  SpO2: 100% 97%   Vitals:   03/23/21 1355 03/23/21 1441 03/23/21 2053 03/24/21 0535  BP: (!) 136/123 (!) 125/93 128/82 130/86  Pulse: 83 89 91 77  Resp: '20 18 16 16  ' Temp: 98.3 F (36.8 C)  98.4 F (36.9 C) 98 F (36.7 C)  TempSrc: Oral  Oral Oral  SpO2: 100%  100% 97%  Weight:      Height:        General: Not in pain or dyspnea, Neurology: Awake and alert, non focal  E ENT: no pallor, no icterus, oral mucosa moist Cardiovascular: No JVD. S1-S2 present, rhythmic, no gallops, rubs, or murmurs. No lower extremity edema. Pulmonary: vesicular breath sounds bilaterally, adequate air movement, no wheezing, rhonchi or rales. Gastrointestinal. Abdomen soft and non tender Skin. No rashes Musculoskeletal: no joint deformities   The results of significant diagnostics from this hospitalization (including imaging,  microbiology, ancillary and laboratory) are listed below for reference.     Microbiology: Recent Results (from the past 240 hour(s))  Urine Culture     Status: Abnormal   Collection Time: 03/21/21  8:59 AM   Specimen: Urine, Random  Result Value Ref Range Status   Specimen Description   Final    URINE, RANDOM Performed at [facility] [address], [place], [place] [address]    Special Requests   Final    NONE Performed at [facility], [place] [address] [place], [place] [address]    Culture >=100,000 COLONIES/mL ESCHERICHIA COLI (A)  Final   Report Status 03/23/2021 FINAL  Final   Organism ID, Bacteria ESCHERICHIA COLI (A)  Final      Susceptibility   Escherichia coli - MIC*    AMPICILLIN <=2 SENSITIVE Sensitive     CEFAZOLIN <=4 SENSITIVE Sensitive     CEFEPIME <=0.12 SENSITIVE Sensitive     CEFTRIAXONE <=0.25 SENSITIVE Sensitive     CIPROFLOXACIN <=0.25 SENSITIVE Sensitive     GENTAMICIN <=1 SENSITIVE Sensitive     IMIPENEM <=0.25 SENSITIVE Sensitive     NITROFURANTOIN <=16 SENSITIVE Sensitive     TRIMETH/SULFA <=20 SENSITIVE Sensitive  AMPICILLIN/SULBACTAM <=2 SENSITIVE Sensitive     PIP/TAZO <=4 SENSITIVE Sensitive     * >=100,000 COLONIES/mL ESCHERICHIA COLI  Resp Panel by RT-PCR (Flu A&B, Covid) Nasopharyngeal Swab     Status: None   Collection Time: 03/21/21  2:44 PM   Specimen: Nasopharyngeal Swab; Nasopharyngeal(NP) swabs in vial transport medium  Result Value Ref Range Status   SARS Coronavirus 2 by RT PCR NEGATIVE NEGATIVE Final    Comment: (NOTE) SARS-CoV-2 target nucleic acids are NOT DETECTED.  The SARS-CoV-2 RNA is generally detectable in upper respiratory specimens during the acute phase of infection. The lowest concentration of SARS-CoV-2 viral copies this assay can detect is 138 copies/mL. A negative result does not preclude SARS-Cov-2 infection and should not be used as the sole basis for treatment or other  patient management decisions. A negative result may occur with  improper specimen collection/handling, submission of specimen other than nasopharyngeal swab, presence of viral mutation(s) within the areas targeted by this assay, and inadequate number of viral copies(<138 copies/mL). A negative result must be combined with clinical observations, patient history, and epidemiological information. The expected result is Negative.  Fact Sheet for Patients:  EntrepreneurPulse.com.au  Fact Sheet for Healthcare Providers:  IncredibleEmployment.be  This test is no t yet approved or cleared by the [place] FDA and  has been authorized for detection and/or diagnosis of SARS-CoV-2 by FDA under an Emergency Use Authorization (EUA). This EUA will remain  in effect (meaning this test can be used) for the duration of the COVID-19 declaration under Section 564(b)(1) of the Act, 21 U.S.C.section 360bbb-3(b)(1), unless the authorization is terminated  or revoked sooner.       Influenza A by PCR NEGATIVE NEGATIVE Final   Influenza B by PCR NEGATIVE NEGATIVE Final    Comment: (NOTE) The Xpert Xpress SARS-CoV-2/FLU/RSV plus assay is intended as an aid in the diagnosis of influenza from Nasopharyngeal swab specimens and should not be used as a sole basis for treatment. Nasal washings and aspirates are unacceptable for Xpert Xpress SARS-CoV-2/FLU/RSV testing.  Fact Sheet for Patients: EntrepreneurPulse.com.au  Fact Sheet for Healthcare Providers: IncredibleEmployment.be  This test is not yet approved or cleared by the [place] FDA and has been authorized for detection and/or diagnosis of SARS-CoV-2 by FDA under an Emergency Use Authorization (EUA). This EUA will remain in effect (meaning this test can be used) for the duration of the COVID-19 declaration under Section 564(b)(1) of the Act, 21 U.S.C. section  360bbb-3(b)(1), unless the authorization is terminated or revoked.  Performed at [facility], [place] [address] [place], [place] [address]      Labs: BNP (last 3 results) No results for input(s): BNP in the last 8760 hours. Basic Metabolic Panel: Recent Labs  Lab 03/20/21 0100 03/21/21 0859 03/21/21 1533 03/21/21 1823 03/22/21 0253 03/23/21 0315 03/24/21 0317  NA 136 134*  --   --  139 135 137  K 2.6* 3.9 3.2*  --  3.1* 3.5 3.5  CL 97* 98  --   --  102 100 101  CO2 25 24  --   --  '30 30 29  ' GLUCOSE 369* 339*  --   --  160* 305* 182*  BUN 10 8  --   --  '6 8 17  ' CREATININE 0.55 0.62  --   --  0.50 0.58 0.60  CALCIUM 9.3 9.4  --   --  8.8* 8.4* 8.7*  MG  --   --   --  1.6*  --   --   --    Liver Function Tests: Recent Labs  Lab 03/21/21 0859 03/21/21 1823 03/22/21 0253  AST 25  --  11*  ALT 12  --  9  ALKPHOS 91  --  71  BILITOT 1.7* 1.1 0.7  PROT 8.1  --  7.2  ALBUMIN 3.5  --  3.1*   No results for input(s): LIPASE, AMYLASE in the last 168 hours. No results for input(s): AMMONIA in the last 168 hours. CBC: Recent Labs  Lab 03/20/21 0100 03/21/21 0859 03/22/21 0253 03/23/21 0315 03/24/21 0317  WBC 6.8 8.4 6.4 3.6* 4.6  NEUTROABS 5.8 7.2  --  2.1 2.7  HGB 14.4 14.1 13.6 11.5* 10.7*  HCT 43.9 42.9 42.4 36.3 34.2*  MCV 83.6 84.3 86.2 87.7 87.0  PLT 307 241 258 211 218   Cardiac Enzymes: No results for input(s): CKTOTAL, CKMB, CKMBINDEX, TROPONINI in the last 168 hours. BNP: Invalid input(s): POCBNP CBG: Recent Labs  Lab 03/23/21 0709 03/23/21 1223 03/23/21 1716 03/23/21 2056 03/24/21 0740  GLUCAP 339* 81 197* 257* 274*   D-Dimer No results for input(s): DDIMER in the last 72 hours. Hgb A1c Recent Labs    03/21/21 1823  HGBA1C 12.9*   Lipid Profile No results for input(s): CHOL, HDL, LDLCALC, TRIG, CHOLHDL, LDLDIRECT in the last 72 hours. Thyroid function studies No results for input(s): TSH, T4TOTAL, T3FREE, THYROIDAB  in the last 72 hours.  Invalid input(s): FREET3 Anemia work up No results for input(s): VITAMINB12, FOLATE, FERRITIN, TIBC, IRON, RETICCTPCT in the last 72 hours. Urinalysis    Component Value Date/Time   COLORURINE YELLOW 03/21/2021 0859   APPEARANCEUR CLOUDY (A) 03/21/2021 0859   APPEARANCEUR Clear 06/26/2014 0116   LABSPEC 1.022 03/21/2021 0859   LABSPEC 1.031 06/26/2014 0116   PHURINE 6.0 03/21/2021 0859   GLUCOSEU >=500 (A) 03/21/2021 0859   GLUCOSEU Negative 06/26/2014 0116   HGBUR MODERATE (A) 03/21/2021 0859   BILIRUBINUR NEGATIVE 03/21/2021 0859   BILIRUBINUR Negative 06/26/2014 0116   KETONESUR 80 (A) 03/21/2021 0859   PROTEINUR 100 (A) 03/21/2021 0859   UROBILINOGEN 0.2 05/17/2019 1326   NITRITE POSITIVE (A) 03/21/2021 0859   LEUKOCYTESUR LARGE (A) 03/21/2021 0859   LEUKOCYTESUR Trace 06/26/2014 0116   Sepsis Labs Invalid input(s): PROCALCITONIN,  WBC,  LACTICIDVEN Microbiology Recent Results (from the past 240 hour(s))  Urine Culture     Status: Abnormal   Collection Time: 03/21/21  8:59 AM   Specimen: Urine, Random  Result Value Ref Range Status   Specimen Description   Final    URINE, RANDOM Performed at [facility], [place] [address], [place], [place] [address]    Special Requests   Final    NONE Performed at [facility], [place] [address] [place],  [address]    Culture >=100,000 COLONIES/mL ESCHERICHIA COLI (A)  Final   Report Status 03/23/2021 FINAL  Final   Organism ID, Bacteria ESCHERICHIA COLI (A)  Final      Susceptibility   Escherichia coli - MIC*    AMPICILLIN <=2 SENSITIVE Sensitive     CEFAZOLIN <=4 SENSITIVE Sensitive     CEFEPIME <=0.12 SENSITIVE Sensitive     CEFTRIAXONE <=0.25 SENSITIVE Sensitive     CIPROFLOXACIN <=0.25 SENSITIVE Sensitive     GENTAMICIN <=1 SENSITIVE Sensitive     IMIPENEM <=0.25 SENSITIVE Sensitive     NITROFURANTOIN <=16 SENSITIVE Sensitive     TRIMETH/SULFA <=20  SENSITIVE Sensitive  AMPICILLIN/SULBACTAM <=2 SENSITIVE Sensitive     PIP/TAZO <=4 SENSITIVE Sensitive     * >=100,000 COLONIES/mL ESCHERICHIA COLI  Resp Panel by RT-PCR (Flu A&B, Covid) Nasopharyngeal Swab     Status: None   Collection Time: 03/21/21  2:44 PM   Specimen: Nasopharyngeal Swab; Nasopharyngeal(NP) swabs in vial transport medium  Result Value Ref Range Status   SARS Coronavirus 2 by RT PCR NEGATIVE NEGATIVE Final    Comment: (NOTE) SARS-CoV-2 target nucleic acids are NOT DETECTED.  The SARS-CoV-2 RNA is generally detectable in upper respiratory specimens during the acute phase of infection. The lowest concentration of SARS-CoV-2 viral copies this assay can detect is 138 copies/mL. A negative result does not preclude SARS-Cov-2 infection and should not be used as the sole basis for treatment or other patient management decisions. A negative result may occur with  improper specimen collection/handling, submission of specimen other than nasopharyngeal swab, presence of viral mutation(s) within the areas targeted by this assay, and inadequate number of viral copies(<138 copies/mL). A negative result must be combined with clinical observations, patient history, and epidemiological information. The expected result is Negative.  Fact Sheet for Patients:  EntrepreneurPulse.com.au  Fact Sheet for Healthcare Providers:  IncredibleEmployment.be  This test is no t yet approved or cleared by the [place] FDA and  has been authorized for detection and/or diagnosis of SARS-CoV-2 by FDA under an Emergency Use Authorization (EUA). This EUA will remain  in effect (meaning this test can be used) for the duration of the COVID-19 declaration under Section 564(b)(1) of the Act, 21 U.S.C.section 360bbb-3(b)(1), unless the authorization is terminated  or revoked sooner.       Influenza A by PCR NEGATIVE NEGATIVE Final   Influenza B by PCR  NEGATIVE NEGATIVE Final    Comment: (NOTE) The Xpert Xpress SARS-CoV-2/FLU/RSV plus assay is intended as an aid in the diagnosis of influenza from Nasopharyngeal swab specimens and should not be used as a sole basis for treatment. Nasal washings and aspirates are unacceptable for Xpert Xpress SARS-CoV-2/FLU/RSV testing.  Fact Sheet for Patients: EntrepreneurPulse.com.au  Fact Sheet for Healthcare Providers: IncredibleEmployment.be  This test is not yet approved or cleared by the [place] FDA and has been authorized for detection and/or diagnosis of SARS-CoV-2 by FDA under an Emergency Use Authorization (EUA). This EUA will remain in effect (meaning this test can be used) for the duration of the COVID-19 declaration under Section 564(b)(1) of the Act, 21 U.S.C. section 360bbb-3(b)(1), unless the authorization is terminated or revoked.  Performed at [facility], [place] [address], [place], [place] [address]      Time coordinating discharge: 45 minutes  SIGNED:   Tawni Millers, MD  [facility] 03/24/2021, 9:54 AM

## 2021-03-24 NOTE — Progress Notes (Signed)
PROGRESS NOTE    Jodi Mcgee  HYI:[id number] DOB: 10/20/1986 DOA: 03/21/2021 PCP: [facility]    Brief Narrative:  Jodi Mcgee was admitted to the hospital with the working diagnosis of pyelonephritis.  35 year old female past medical history for type 2 diabetes mellitus, GERD and anxiety who presented with back pain/abdominal pain. Reported severe low back pain for 24 hours, radiates to the left flank, associated with dark urine and dysuria. Because of persistent and severe symptoms she came to the hospital for further evaluation. On her initial physical examination blood pressure 145/95, heart rate 116, respiratory rate 17, oxygen saturation 98%, her lungs were clear to auscultation bilaterally, heart S1-S2, present, rhythmic, tachycardic abdomen nontender to palpation, no lower extremity edema. Positive costovertebral angle tenderness.  Sodium 134, potassium 3.9, chloride 98, bicarb 24, glucose 339, BUN 8, creatinine 0.62, anion gap 12, white count 8.4, hemoglobin 14.1, hematocrit 42.9, platelets 241. SARS COVID-19 negative.  Urinalysis specific gravity 1.022, 100 protein, 11-20 red cells,>50 white cells.  Renal stone study CT with fluid-filled stomach, no urolithiasis or obstructive uropathy. Moderate amount of stool in the ascending and transverse colon.  Chest radiograph with right hemidiaphragm elevation.  EKG 92 bpm, normal axis, normal intervals, sinus rhythm, no ST segment T wave changes.  Patient placed on IV fluids, IV analgesics and IV antibiotic therapy with ceftriaxone.   Urine culture positive for E coli, pan sensitive, change to IV Cefazolin.  Clinical improvement, transitioned to oral Cephalexin for 5 more days.  Discharge will be delayed for today due to back pain and nausea.    Assessment & Plan:   Principal Problem:   Pyelonephritis Active Problems:   Type 2 diabetes mellitus, without long-term current  use of insulin (HCC)   Chronic hypertension   Hypokalemia   GERD (gastroesophageal reflux disease)     1. Acute pyelonephritis (not sepsis). Pan-sensitive E coli.  Patient continue to afebrile. This am had episode of hematuria, and recurrent nausea.   Transition to oral antibiotic therapy with cephalexin in preparation for discharge tomorrow morning. She is having back pain and nausea that will delayed her discharge until tomorrow.   2. Hypokalemia.  patient with nausea but still tolerating po well, her renal function is stable with serum cr at 0,60, K is 3,5 and serum bicarbonate at 29.  3. T2DM. adjusted dose of insulin to 15 units bid, fasting glucose this am is 182, capillary 275. Continue with insulin sliding scale for glucose cover and monitoring. Patient with nausea but not vomiting.   4. HTN. Continue blood pressure control with amlodipine.   5. GERD. Continue with pantoprazole    Status is: Inpatient  Remains inpatient appropriate because:Inpatient level of care appropriate due to severity of illness   Dispo: The patient is from: Home              Anticipated d/c is to: Home              Patient currently is not medically stable to d/c. Plan for possible discharge tomorrow.    Difficult to place patient No   DVT prophylaxis: Enoxaparin   Code Status:   full  Family Communication:  No family at the bedside       Antimicrobials:   Ceftriaxone  Cefazolin   Cephalexin     Subjective: Patient this am with worsening back pain and nausea, positive one episode of hematuria, no vomiting, no chest pain or dyspnea. Back pain worse with  movement,   Objective: Vitals:   03/23/21 1355 03/23/21 1441 03/23/21 2053 03/24/21 0535  BP: (!) 136/123 (!) 125/93 128/82 130/86  Pulse: 83 89 91 77  Resp: 20 18 16 16   Temp: 98.3 F (36.8 C)  98.4 F (36.9 C) 98 F (36.7 C)  TempSrc: Oral  Oral Oral  SpO2: 100%  100% 97%  Weight:      Height:         Intake/Output Summary (Last 24 hours) at 03/24/2021 1024 Last data filed at 03/23/2021 1449 Gross per 24 hour  Intake 459.61 ml  Output --  Net 459.61 ml   Filed Weights   03/21/21 0846  Weight: 63.5 kg    Examination:   General: Not in pain or dyspnea, in pain  Neurology: Awake and alert, non focal  E ENT: no pallor, no icterus, oral mucosa moist Cardiovascular: No JVD. S1-S2 present, rhythmic, no gallops, rubs, or murmurs. No lower extremity edema. Pulmonary: positive breath sounds bilaterally, with wheezing, rhonchi or rales. Gastrointestinal. Abdomen soft and non tender Skin. No rashes Musculoskeletal: positive tender to palpation at the paraspinal muscles up to the thoracic spine bilaterally.      Data Reviewed: I have personally reviewed following labs and imaging studies  CBC: Recent Labs  Lab 03/20/21 0100 03/21/21 0859 03/22/21 0253 03/23/21 0315 03/24/21 0317  WBC 6.8 8.4 6.4 3.6* 4.6  NEUTROABS 5.8 7.2  --  2.1 2.7  HGB 14.4 14.1 13.6 11.5* 10.7*  HCT 43.9 42.9 42.4 36.3 34.2*  MCV 83.6 84.3 86.2 87.7 87.0  PLT 307 241 258 211 751   Basic Metabolic Panel: Recent Labs  Lab 03/20/21 0100 03/21/21 0859 03/21/21 1533 03/21/21 1823 03/22/21 0253 03/23/21 0315 03/24/21 0317  NA 136 134*  --   --  139 135 137  K 2.6* 3.9 3.2*  --  3.1* 3.5 3.5  CL 97* 98  --   --  102 100 101  CO2 25 24  --   --  30 30 29   GLUCOSE 369* 339*  --   --  160* 305* 182*  BUN 10 8  --   --  6 8 17   CREATININE 0.55 0.62  --   --  0.50 0.58 0.60  CALCIUM 9.3 9.4  --   --  8.8* 8.4* 8.7*  MG  --   --   --  1.6*  --   --   --    GFR: Estimated Creatinine Clearance: 95.4 mL/min (by C-G formula based on SCr of 0.6 mg/dL). Liver Function Tests: Recent Labs  Lab 03/21/21 0859 03/21/21 1823 03/22/21 0253  AST 25  --  11*  ALT 12  --  9  ALKPHOS 91  --  71  BILITOT 1.7* 1.1 0.7  PROT 8.1  --  7.2  ALBUMIN 3.5  --  3.1*   No results for input(s): LIPASE, AMYLASE in  the last 168 hours. No results for input(s): AMMONIA in the last 168 hours. Coagulation Profile: No results for input(s): INR, PROTIME in the last 168 hours. Cardiac Enzymes: No results for input(s): CKTOTAL, CKMB, CKMBINDEX, TROPONINI in the last 168 hours. BNP (last 3 results) No results for input(s): PROBNP in the last 8760 hours. HbA1C: Recent Labs    03/21/21 1823  HGBA1C 12.9*   CBG: Recent Labs  Lab 03/23/21 0709 03/23/21 1223 03/23/21 1716 03/23/21 2056 03/24/21 0740  GLUCAP 339* 81 197* 257* 274*   Lipid Profile: No results for input(s):  CHOL, HDL, LDLCALC, TRIG, CHOLHDL, LDLDIRECT in the last 72 hours. Thyroid Function Tests: No results for input(s): TSH, T4TOTAL, FREET4, T3FREE, THYROIDAB in the last 72 hours. Anemia Panel: No results for input(s): VITAMINB12, FOLATE, FERRITIN, TIBC, IRON, RETICCTPCT in the last 72 hours.    Radiology Studies: I have reviewed all of the imaging during this hospital visit personally     Scheduled Meds: . amLODipine  5 mg Oral Daily  . cephALEXin  500 mg Oral Q8H  . cyclobenzaprine  5 mg Oral TID  . insulin aspart  0-20 Units Subcutaneous TID WC  . insulin aspart  0-5 Units Subcutaneous QHS  . insulin detemir  15 Units Subcutaneous BID  . loratadine  10 mg Oral Daily  . polyethylene glycol  17 g Oral Daily   Continuous Infusions:   LOS: 3 days        Shanitha Twining Gerome Apley, MD

## 2021-03-24 NOTE — Plan of Care (Signed)
  Problem: Clinical Measurements: Goal: Will remain free from infection Outcome: Progressing   Problem: Clinical Measurements: Goal: Diagnostic test results will improve Outcome: Progressing   Problem: Clinical Measurements: Goal: Respiratory complications will improve Outcome: Progressing   Problem: Activity: Goal: Risk for activity intolerance will decrease Outcome: Progressing   Problem: Coping: Goal: Level of anxiety will decrease Outcome: Progressing

## 2021-03-25 LAB — GLUCOSE, CAPILLARY: Glucose-Capillary: 334 mg/dL — ABNORMAL HIGH (ref 70–99)

## 2021-03-25 MED ORDER — CYCLOBENZAPRINE HCL 5 MG PO TABS: 5.0000 mg | ORAL_TABLET | Freq: Three times a day (TID) | ORAL | 0 refills | Status: DC | PRN

## 2021-03-25 MED ORDER — POLYETHYLENE GLYCOL 3350 17 G PO PACK: 17.0000 g | PACK | Freq: Every day | ORAL | 0 refills | Status: DC

## 2021-03-25 MED ORDER — CEPHALEXIN 500 MG PO CAPS: 500.0000 mg | ORAL_CAPSULE | Freq: Three times a day (TID) | ORAL | 0 refills | Status: AC

## 2021-03-25 MED ORDER — BISACODYL 5 MG PO TBEC: 5.0000 mg | DELAYED_RELEASE_TABLET | Freq: Every day | ORAL | Status: DC | PRN

## 2021-03-25 MED ORDER — BISACODYL 5 MG PO TBEC: 5.0000 mg | DELAYED_RELEASE_TABLET | Freq: Every day | ORAL | 0 refills | Status: DC | PRN

## 2021-03-25 NOTE — Plan of Care (Signed)
  Problem: Urinary Elimination: Goal: Signs and symptoms of infection will decrease Outcome: Progressing   Problem: Education: Goal: Knowledge of General Education information will improve Description: Including pain rating scale, medication(s)/side effects and non-pharmacologic comfort measures Outcome: Progressing   Problem: Elimination: Goal: Will not experience complications related to bowel motility Outcome: Progressing

## 2021-03-27 ENCOUNTER — Emergency Department (HOSPITAL_COMMUNITY)
Admission: EM | Admit: 2021-03-27 | Discharge: 2021-03-27 | Disposition: A | Discharge status: Home / Self Care | Payer: Medicaid Other | Attending: Emergency Medicine | Admitting: Emergency Medicine

## 2021-03-27 ENCOUNTER — Emergency Department (HOSPITAL_COMMUNITY): Payer: Medicaid Other

## 2021-03-27 ENCOUNTER — Other Ambulatory Visit: Payer: Self-pay

## 2021-03-27 ENCOUNTER — Encounter (HOSPITAL_COMMUNITY): Payer: Self-pay | Admitting: Emergency Medicine

## 2021-03-27 DIAGNOSIS — R059 Cough, unspecified: Secondary | ICD-10-CM | POA: Insufficient documentation

## 2021-03-27 DIAGNOSIS — M546 Pain in thoracic spine: Secondary | ICD-10-CM | POA: Diagnosis present

## 2021-03-27 DIAGNOSIS — E1110 Type 2 diabetes mellitus with ketoacidosis without coma: Secondary | ICD-10-CM | POA: Diagnosis not present

## 2021-03-27 DIAGNOSIS — R197 Diarrhea, unspecified: Secondary | ICD-10-CM | POA: Diagnosis not present

## 2021-03-27 DIAGNOSIS — R110 Nausea: Secondary | ICD-10-CM | POA: Insufficient documentation

## 2021-03-27 DIAGNOSIS — Z794 Long term (current) use of insulin: Secondary | ICD-10-CM | POA: Diagnosis not present

## 2021-03-27 DIAGNOSIS — F17210 Nicotine dependence, cigarettes, uncomplicated: Secondary | ICD-10-CM | POA: Diagnosis not present

## 2021-03-27 DIAGNOSIS — M545 Low back pain, unspecified: Secondary | ICD-10-CM

## 2021-03-27 LAB — COMPREHENSIVE METABOLIC PANEL
ALT: 26 U/L (ref 0–44)
AST: 21 U/L (ref 15–41)
Albumin: 3.6 g/dL (ref 3.5–5.0)
Alkaline Phosphatase: 82 U/L (ref 38–126)
Anion gap: 11 (ref 5–15)
BUN: 8 mg/dL (ref 6–20)
CO2: 27 mmol/L (ref 22–32)
Calcium: 9.4 mg/dL (ref 8.9–10.3)
Chloride: 97 mmol/L — ABNORMAL LOW (ref 98–111)
Creatinine, Ser: 0.62 mg/dL (ref 0.44–1.00)
GFR, Estimated: >60 mL/min (ref >60)
Glucose, Bld: 437 mg/dL — ABNORMAL HIGH (ref 70–99)
Potassium: 4 mmol/L (ref 3.5–5.1)
Sodium: 135 mmol/L (ref 135–145)
Total Bilirubin: 0.2 mg/dL — ABNORMAL LOW (ref 0.3–1.2)
Total Protein: 8 g/dL (ref 6.5–8.1)

## 2021-03-27 LAB — CBC
HCT: 40.5 % (ref 36.0–46.0)
Hemoglobin: 12.9 g/dL (ref 12.0–15.0)
MCH: 27.4 pg (ref 26.0–34.0)
MCHC: 31.9 g/dL (ref 30.0–36.0)
MCV: 86.2 fL (ref 80.0–100.0)
Platelets: 339 10*3/uL (ref 150–400)
RBC: 4.7 MIL/uL (ref 3.87–5.11)
RDW: 14.4 % (ref 11.5–15.5)
WBC: 5.6 10*3/uL (ref 4.0–10.5)
nRBC: 0 % (ref 0.0–0.2)

## 2021-03-27 LAB — URINALYSIS, ROUTINE W REFLEX MICROSCOPIC
Bacteria, UA: NONE SEEN
Bilirubin Urine: NEGATIVE
Glucose, UA: >=500 mg/dL — AB
Hgb urine dipstick: NEGATIVE
Ketones, ur: NEGATIVE mg/dL
Leukocytes,Ua: NEGATIVE
Nitrite: NEGATIVE
Protein, ur: NEGATIVE mg/dL
Specific Gravity, Urine: 1.025 (ref 1.005–1.030)
pH: 6 (ref 5.0–8.0)

## 2021-03-27 LAB — CBG MONITORING, ED
Glucose-Capillary: 485 mg/dL — ABNORMAL HIGH (ref 70–99)
Glucose-Capillary: 353 mg/dL — ABNORMAL HIGH (ref 70–99)

## 2021-03-27 LAB — D-DIMER, QUANTITATIVE: D-Dimer, Quant: 1.95 ug/mL-FEU — ABNORMAL HIGH (ref 0.00–0.50)

## 2021-03-27 MED ORDER — MORPHINE SULFATE (PF) 4 MG/ML IV SOLN
6.0000 mg | Freq: Once | INTRAVENOUS | Status: AC
Start: 2021-03-27 — End: 2021-03-27
  Administered 2021-03-27: 6 mg via INTRAVENOUS
  Filled 2021-03-27: qty 2

## 2021-03-27 MED ORDER — HYDROMORPHONE HCL 1 MG/ML IJ SOLN
1.0000 mg | Freq: Once | INTRAMUSCULAR | Status: AC
  Administered 2021-03-27: 1 mg via INTRAVENOUS
  Filled 2021-03-27: qty 1

## 2021-03-27 MED ORDER — HYDROCORTISONE NA SUCCINATE PF 250 MG IJ SOLR
200.0000 mg | Freq: Once | INTRAMUSCULAR | Status: AC
  Administered 2021-03-27: 200 mg via INTRAVENOUS
  Filled 2021-03-27: qty 200

## 2021-03-27 MED ORDER — DIPHENHYDRAMINE HCL 25 MG PO CAPS
50.0000 mg | ORAL_CAPSULE | Freq: Once | ORAL | Status: AC
  Administered 2021-03-27: 50 mg via ORAL
  Filled 2021-03-27: qty 2

## 2021-03-27 MED ORDER — LACTATED RINGERS IV SOLN: INTRAVENOUS | Status: DC

## 2021-03-27 MED ORDER — DIPHENHYDRAMINE HCL 50 MG/ML IJ SOLN: 50.0000 mg | Freq: Once | INTRAMUSCULAR | Status: AC

## 2021-03-27 MED ORDER — MORPHINE SULFATE (PF) 4 MG/ML IV SOLN
6.0000 mg | Freq: Once | INTRAVENOUS | Status: AC
  Administered 2021-03-27: 6 mg via INTRAVENOUS
  Filled 2021-03-27: qty 2

## 2021-03-27 MED ORDER — INSULIN ASPART 100 UNIT/ML IJ SOLN
10.0000 [IU] | Freq: Once | INTRAMUSCULAR | Status: AC
  Administered 2021-03-27: 10 [IU] via SUBCUTANEOUS
  Filled 2021-03-27: qty 0.1

## 2021-03-27 MED ORDER — LACTATED RINGERS IV BOLUS
1000.0000 mL | Freq: Once | INTRAVENOUS | Status: AC
  Administered 2021-03-27: 1000 mL via INTRAVENOUS

## 2021-03-27 MED ORDER — OXYCODONE-ACETAMINOPHEN 5-325 MG PO TABS
1.0000 | ORAL_TABLET | Freq: Once | ORAL | Status: AC
  Administered 2021-03-27: 1 via ORAL
  Filled 2021-03-27: qty 1

## 2021-03-27 MED ORDER — LORAZEPAM 2 MG/ML IJ SOLN: 1.0000 mg | Freq: Once | INTRAMUSCULAR | Status: DC

## 2021-03-27 MED ORDER — METHOCARBAMOL 500 MG PO TABS: 500.0000 mg | ORAL_TABLET | Freq: Two times a day (BID) | ORAL | 0 refills | Status: DC

## 2021-03-27 MED ORDER — IOHEXOL 350 MG/ML SOLN
80.0000 mL | Freq: Once | INTRAVENOUS | Status: AC | PRN
  Administered 2021-03-27: 80 mL via INTRAVENOUS

## 2021-03-27 MED ORDER — ONDANSETRON 4 MG PO TBDP
4.0000 mg | ORAL_TABLET | Freq: Once | ORAL | Status: AC
  Administered 2021-03-27: 4 mg via ORAL
  Filled 2021-03-27: qty 1

## 2021-03-27 NOTE — ED Provider Notes (Signed)
Emergency Medicine Provider Triage Evaluation Note  Jodi Mcgee , a 35 y.o. female  was evaluated in triage.  Pt complains of ? Kidney infection + diarrhea, nausea, urinary urgency, BL flank pain.On abx- not better,  Review of Systems  Positive: Back pain  Negative: Weakness, fever  Physical Exam  BP (!) 132/97 (BP Location: Left Arm)   Pulse 93   Temp 97.9 F (36.6 C) (Oral)   Resp 18   SpO2 100%  Gen:   Awake, no distress   Resp:  Normal effort  MSK:   Moves extremities without difficulty  Other:  CVA tenderness  Medical Decision Making  Medically screening exam initiated at 10:36 AM.  Appropriate orders placed.  Jodi Mcgee was informed that the remainder of the evaluation will be completed by another provider, this initial triage assessment does not replace that evaluation, and the importance of remaining in the ED until their evaluation is complete.  Labs, meds ordered   Margarita Mail, Hershal Coria 03/27/21 1040    Carmin Muskrat, MD 03/28/21 [phone]

## 2021-03-27 NOTE — ED Notes (Addendum)
Pt taken to CT at this time.

## 2021-03-27 NOTE — ED Notes (Signed)
Patient given DC instructions. Ride is on the way. Patient is using the restroom, walking independently.

## 2021-03-27 NOTE — ED Triage Notes (Signed)
Patient reports being discharged from the hospital d/t a kidney infection in which she was admitted for 6 days. She states she was d/c with antibiotics which she has been taking but states she has been feeling progressively worse over the weekend. She also states she has been checking her blood sugar at home which has been running high (exceeds number limit). She reports she has been taking her insulin. She reports n/v/d. Reports hx DM 2.

## 2021-03-27 NOTE — ED Notes (Signed)
Back from Ct. Patient is requesting pain medicine.

## 2021-03-27 NOTE — ED Provider Notes (Signed)
[facility] DEPT Provider Note   CSN: [id number] Arrival date & time: 03/27/21  0941     History Chief Complaint  Patient presents with  . Back Pain    Matti KEAJAH KILLOUGH is a 35 y.o. female.  35 year old female presents with continued upper thoracic back pain after pain discharge in the hospital 2 days ago where she was treated for antibiotics.  Denies any fever.  Does note some increased dyspnea.  Has had nonproductive cough.  Does have history of PE in the past about 2 years ago.  Was on Lovenox further at the time.  Has had diarrhea and some nausea.  Denies any urinary symptoms.        Past Medical History:  Diagnosis Date  . Anemia   . Anxiety   . Blood transfusion without reported diagnosis    both CS  . Cocaine abuse ([place])   . Diabetes mellitus without complication ([place])   . GERD (gastroesophageal reflux disease)    has resolved  . IBS (irritable bowel syndrome)   . Ovarian cyst   . Peritonitis, acute generalized ([place])   . Pulmonary embolism ([place])   . SBO (small bowel obstruction) ([place])   . Sepsis [facility])     Patient Active Problem List   Diagnosis Date Noted  . Hypokalemia 03/22/2021  . GERD (gastroesophageal reflux disease) 03/22/2021  . Pyelonephritis 03/21/2021  . Controlled type 2 diabetes mellitus with hyperglycemia ([place]) 05/10/2020  . Partial small bowel obstruction ([place]) 05/09/2020  . Bartholin's gland abscess 02/21/2019  . DKA (diabetic ketoacidoses) 02/20/2019  . Blood per rectum 02/20/2019  . Hyponatremia 09/26/2018  . Chronic hypertension 01/03/2018  . IUD contraception 01/03/2018  . Pulmonary embolism during puerperium 12/04/2017  . Pulmonary edema 11/30/2017  . Pelvic adhesive disease 10/22/2017  . Nasal septal perforation 10/17/2017  . History of cocaine abuse ([place]) 10/17/2017  . Epistaxis 10/17/2017  . 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 Obesity (BMI 30-39.9) 09/25/2017  . Type 2 diabetes  mellitus, without long-term current use of insulin ([place]) 08/23/2017  . Chronic pain syndrome 08/23/2017    Past Surgical History:  Procedure Laterality Date  . APPENDECTOMY     ruptured   . BUNIONECTOMY    . CESAREAN SECTION    . CESAREAN SECTION Bilateral 11/20/2017   Procedure: CESAREAN SECTION;  Surgeon: Sloan Leiter, MD;  Location: [facility];  Service: Obstetrics;  Laterality: Bilateral;  . OVARIAN CYST DRAINAGE    . SMALL BOWEL REPAIR       OB History    Gravida  3   Para  3   Term  1   Preterm  2   AB  0   Living  3     SAB  0   IAB  0   Ectopic  0   Multiple  0   Live Births  3           Family History  Problem Relation Age of Onset  . Hypertension Mother   . Anemia Mother   . Diabetes Father     Social History   Tobacco Use  . Smoking status: Light Tobacco Smoker    Packs/day: 0.50    Types: Cigarettes  . Smokeless tobacco: Never Used  . Tobacco comment: social use with tobacco  Vaping Use  . Vaping Use: Some days  Substance Use Topics  . Alcohol use: Yes    Comment: 3 times a month  .  Drug use: Not Currently    Home Medications Prior to Admission medications   Medication Sig Start Date End Date Taking? Authorizing Provider  acetaminophen (TYLENOL) 325 MG tablet Take 650 mg by mouth every 6 (six) hours as needed for mild pain, fever or headache.    [provider]  bisacodyl (DULCOLAX) 5 MG EC tablet Take 1 tablet (5 mg total) by mouth daily as needed for moderate constipation or severe constipation. 03/25/21   Arrien, Jimmy Picket, MD  blood glucose meter kit and supplies KIT Dispense based on patient and insurance preference. Use up to four times daily as directed. (FOR ICD-9 250.00, 250.01). 02/21/19   Oretha Milch D, MD  cephALEXin (KEFLEX) 500 MG capsule Take 1 capsule (500 mg total) by mouth every 8 (eight) hours for 3 days. 03/25/21 03/28/21  Arrien, Jimmy Picket, MD  cyclobenzaprine (FLEXERIL) 5 MG tablet  Take 1 tablet (5 mg total) by mouth 3 (three) times daily as needed for muscle spasms. 03/25/21   Arrien, Jimmy Picket, MD  HUMULIN R 100 UNIT/ML injection Inject 0-20 Units into the skin 3 (three) times daily before meals. Per sliding scale 03/24/20   [provider]  insulin detemir (LEVEMIR) 100 unit/ml SOLN Inject 20 Units into the skin 3 (three) times daily. 03/02/21   Matilde Haymaker, MD  Levonorgestrel (LILETTA) 19.5 MCG/DAY IUD IUD 1 each by Intrauterine route continuous. Placed in 2019    [provider]  oxyCODONE-acetaminophen (PERCOCET/ROXICET) 5-325 MG tablet Take 1 tablet by mouth every 6 (six) hours as needed. 06/01/20   [provider]  polyethylene glycol (MIRALAX / GLYCOLAX) 17 g packet Take 17 g by mouth daily. 03/26/21   Arrien, Jimmy Picket, MD    Allergies    Hydrocodone, Cetirizine & related, Toradol [ketorolac tromethamine], Flagyl [metronidazole], Contrast media [iodinated diagnostic agents], and Nsaids  Review of Systems   Review of Systems  All other systems reviewed and are negative.   Physical Exam Updated Vital Signs BP (!) 134/95   Pulse (!) 101   Temp 97.9 F (36.6 C) (Oral)   Resp (!) 21   SpO2 100%   Physical Exam Vitals and nursing note reviewed.  Constitutional:      General: She is not in acute distress.    Appearance: Normal appearance. She is well-developed. She is not toxic-appearing.  HENT:     Head: Normocephalic and atraumatic.  Eyes:     General: Lids are normal.     Conjunctiva/sclera: Conjunctivae normal.     Pupils: Pupils are equal, round, and reactive to light.  Neck:     Thyroid: No thyroid mass.     Trachea: No tracheal deviation.  Cardiovascular:     Rate and Rhythm: Normal rate and regular rhythm.     Heart sounds: Normal heart sounds. No murmur heard. No gallop.   Pulmonary:     Effort: Pulmonary effort is normal. No respiratory distress.     Breath sounds: Normal breath sounds. No stridor. No  decreased breath sounds, wheezing, rhonchi or rales.  Abdominal:     General: Bowel sounds are normal. There is no distension.     Palpations: Abdomen is soft.     Tenderness: There is no abdominal tenderness. There is no rebound.  Musculoskeletal:        General: No tenderness. Normal range of motion.     Cervical back: Normal range of motion and neck supple.  Skin:    General: Skin is warm and dry.  Findings: No abrasion or rash.  Neurological:     Mental Status: She is alert and oriented to person, place, and time.     GCS: GCS eye subscore is 4. GCS verbal subscore is 5. GCS motor subscore is 6.     Cranial Nerves: No cranial nerve deficit.     Sensory: No sensory deficit.  Psychiatric:        Speech: Speech normal.        Behavior: Behavior normal.     ED Results / Procedures / Treatments   Labs (all labs ordered are listed, but only abnormal results are displayed) Labs Reviewed  URINALYSIS, ROUTINE W REFLEX MICROSCOPIC - Abnormal; Notable for the following components:      Result Value   Color, Urine STRAW (*)    Glucose, UA >=500 (*)    All other components within normal limits  COMPREHENSIVE METABOLIC PANEL - Abnormal; Notable for the following components:   Chloride 97 (*)    Glucose, Bld 437 (*)    Total Bilirubin 0.2 (*)    All other components within normal limits  CBG MONITORING, ED - Abnormal; Notable for the following components:   Glucose-Capillary 485 (*)    All other components within normal limits  URINE CULTURE  CBC    EKG None  Radiology No results found.  Procedures Procedures   Medications Ordered in ED Medications  lactated ringers infusion (has no administration in time range)  lactated ringers bolus 1,000 mL (has no administration in time range)  insulin aspart (novoLOG) injection 10 Units (has no administration in time range)  morphine 4 MG/ML injection 6 mg (has no administration in time range)  ondansetron (ZOFRAN-ODT)  disintegrating tablet 4 mg (4 mg Oral Given 03/27/21 1049)  oxyCODONE-acetaminophen (PERCOCET/ROXICET) 5-325 MG per tablet 1 tablet (1 tablet Oral Given 03/27/21 1048)    ED Course  I have reviewed the triage vital signs and the nursing notes.  Pertinent labs & imaging results that were available during my care of the patient were reviewed by me and considered in my medical decision making (see chart for details).    MDM Rules/Calculators/A&P                          Patient's hyperglycemia treat with insulin and IV fluids and blood sugar has improved.  Elevated D-dimer noted and chest CT without PE.  Repeat urinalysis does not show any evidence of infection.  Patient medicated for pain and will be discharged home.  Suspect musculoskeletal etiology Final Clinical Impression(s) / ED Diagnoses Final diagnoses:  None    Rx / DC Orders ED Discharge Orders    None       Lacretia Leigh, MD 03/27/21 2004

## 2021-03-28 LAB — URINE CULTURE: Culture: NO GROWTH

## 2021-04-01 ENCOUNTER — Emergency Department (HOSPITAL_BASED_OUTPATIENT_CLINIC_OR_DEPARTMENT_OTHER)
Admission: EM | Admit: 2021-04-01 | Discharge: 2021-04-01 | Disposition: A | Discharge status: Home / Self Care | Payer: Medicaid Other | Attending: Emergency Medicine | Admitting: Emergency Medicine

## 2021-04-01 ENCOUNTER — Emergency Department (HOSPITAL_BASED_OUTPATIENT_CLINIC_OR_DEPARTMENT_OTHER): Payer: Medicaid Other

## 2021-04-01 ENCOUNTER — Other Ambulatory Visit: Payer: Self-pay

## 2021-04-01 DIAGNOSIS — R109 Unspecified abdominal pain: Secondary | ICD-10-CM | POA: Diagnosis present

## 2021-04-01 DIAGNOSIS — A599 Trichomoniasis, unspecified: Secondary | ICD-10-CM | POA: Diagnosis not present

## 2021-04-01 DIAGNOSIS — R197 Diarrhea, unspecified: Secondary | ICD-10-CM | POA: Diagnosis not present

## 2021-04-01 DIAGNOSIS — Z20822 Contact with and (suspected) exposure to covid-19: Secondary | ICD-10-CM | POA: Diagnosis not present

## 2021-04-01 DIAGNOSIS — R112 Nausea with vomiting, unspecified: Secondary | ICD-10-CM | POA: Diagnosis not present

## 2021-04-01 DIAGNOSIS — Z8719 Personal history of other diseases of the digestive system: Secondary | ICD-10-CM | POA: Insufficient documentation

## 2021-04-01 DIAGNOSIS — I10 Essential (primary) hypertension: Secondary | ICD-10-CM | POA: Insufficient documentation

## 2021-04-01 DIAGNOSIS — F17210 Nicotine dependence, cigarettes, uncomplicated: Secondary | ICD-10-CM | POA: Diagnosis not present

## 2021-04-01 DIAGNOSIS — E1165 Type 2 diabetes mellitus with hyperglycemia: Secondary | ICD-10-CM | POA: Diagnosis not present

## 2021-04-01 DIAGNOSIS — Z794 Long term (current) use of insulin: Secondary | ICD-10-CM | POA: Insufficient documentation

## 2021-04-01 DIAGNOSIS — R739 Hyperglycemia, unspecified: Secondary | ICD-10-CM

## 2021-04-01 LAB — CBC WITH DIFFERENTIAL/PLATELET
Abs Immature Granulocytes: 0.04 10*3/uL (ref 0.00–0.07)
Basophils Absolute: 0 10*3/uL (ref 0.0–0.1)
Basophils Relative: 0 %
Eosinophils Absolute: 0.1 10*3/uL (ref 0.0–0.5)
Eosinophils Relative: 1 %
HCT: 38.5 % (ref 36.0–46.0)
Hemoglobin: 12.8 g/dL (ref 12.0–15.0)
Immature Granulocytes: 1 %
Lymphocytes Relative: 17 %
Lymphs Abs: 1.3 10*3/uL (ref 0.7–4.0)
MCH: 27.6 pg (ref 26.0–34.0)
MCHC: 33.2 g/dL (ref 30.0–36.0)
MCV: 83.2 fL (ref 80.0–100.0)
Monocytes Absolute: 0.3 10*3/uL (ref 0.1–1.0)
Monocytes Relative: 4 %
Neutro Abs: 5.5 10*3/uL (ref 1.7–7.7)
Neutrophils Relative %: 77 %
Platelets: 366 10*3/uL (ref 150–400)
RBC: 4.63 MIL/uL (ref 3.87–5.11)
RDW: 14 % (ref 11.5–15.5)
WBC: 7.2 10*3/uL (ref 4.0–10.5)
nRBC: 0 % (ref 0.0–0.2)

## 2021-04-01 LAB — COMPREHENSIVE METABOLIC PANEL
ALT: 16 U/L (ref 0–44)
AST: 17 U/L (ref 15–41)
Albumin: 3.5 g/dL (ref 3.5–5.0)
Alkaline Phosphatase: 72 U/L (ref 38–126)
Anion gap: 9 (ref 5–15)
BUN: 6 mg/dL (ref 6–20)
CO2: 26 mmol/L (ref 22–32)
Calcium: 8.7 mg/dL — ABNORMAL LOW (ref 8.9–10.3)
Chloride: 93 mmol/L — ABNORMAL LOW (ref 98–111)
Creatinine, Ser: 0.57 mg/dL (ref 0.44–1.00)
GFR, Estimated: >60 mL/min (ref >60)
Glucose, Bld: 507 mg/dL (ref 70–99)
Potassium: 3.9 mmol/L (ref 3.5–5.1)
Sodium: 128 mmol/L — ABNORMAL LOW (ref 135–145)
Total Bilirubin: 0.5 mg/dL (ref 0.3–1.2)
Total Protein: 7.1 g/dL (ref 6.5–8.1)

## 2021-04-01 LAB — URINALYSIS, ROUTINE W REFLEX MICROSCOPIC
Bilirubin Urine: NEGATIVE
Glucose, UA: >=500 mg/dL — AB
Ketones, ur: NEGATIVE mg/dL
Leukocytes,Ua: NEGATIVE
Nitrite: NEGATIVE
Protein, ur: NEGATIVE mg/dL
Specific Gravity, Urine: <1.005 — ABNORMAL LOW (ref 1.005–1.030)
pH: 7 (ref 5.0–8.0)

## 2021-04-01 LAB — CBG MONITORING, ED: Glucose-Capillary: 427 mg/dL — ABNORMAL HIGH (ref 70–99)

## 2021-04-01 LAB — URINALYSIS, MICROSCOPIC (REFLEX)

## 2021-04-01 LAB — LIPASE, BLOOD: Lipase: 30 U/L (ref 11–51)

## 2021-04-01 LAB — SARS CORONAVIRUS 2 (TAT 6-24 HRS): SARS Coronavirus 2: NEGATIVE

## 2021-04-01 LAB — PREGNANCY, URINE: Preg Test, Ur: NEGATIVE

## 2021-04-01 MED ORDER — ONDANSETRON HCL 4 MG/2ML IJ SOLN
4.0000 mg | Freq: Once | INTRAMUSCULAR | Status: AC
  Administered 2021-04-01: 4 mg via INTRAVENOUS
  Filled 2021-04-01: qty 2

## 2021-04-01 MED ORDER — OXYCODONE-ACETAMINOPHEN 5-325 MG PO TABS
2.0000 | ORAL_TABLET | Freq: Once | ORAL | Status: AC
  Administered 2021-04-01: 2 via ORAL
  Filled 2021-04-01: qty 2

## 2021-04-01 MED ORDER — SODIUM CHLORIDE 0.9 % IV BOLUS
1000.0000 mL | Freq: Once | INTRAVENOUS | Status: AC
  Administered 2021-04-01: 1000 mL via INTRAVENOUS

## 2021-04-01 MED ORDER — MORPHINE SULFATE (PF) 4 MG/ML IV SOLN
4.0000 mg | Freq: Once | INTRAVENOUS | Status: AC
  Administered 2021-04-01: 4 mg via INTRAVENOUS
  Filled 2021-04-01: qty 1

## 2021-04-01 MED ORDER — CEPHALEXIN 500 MG PO CAPS: 500.0000 mg | ORAL_CAPSULE | Freq: Four times a day (QID) | ORAL | 0 refills | Status: DC

## 2021-04-01 MED ORDER — INSULIN ASPART PROT & ASPART (70-30 MIX) 100 UNIT/ML ~~LOC~~ SUSP
10.0000 [IU] | Freq: Once | SUBCUTANEOUS | Status: AC
  Administered 2021-04-01: 10 [IU] via SUBCUTANEOUS
  Filled 2021-04-01: qty 10

## 2021-04-01 NOTE — ED Triage Notes (Signed)
Bilateral flank pain , recent hospitalization for bilateral pylo x 7 days , DC on 03/25/2021. Denies urinary symptoms.

## 2021-04-01 NOTE — ED Provider Notes (Signed)
[place] EMERGENCY DEPARTMENT Provider Note   CSN: [id number] Arrival date & time: 04/01/21  1041     History Chief Complaint  Patient presents with   Flank Pain    bilateral    Jodi Mcgee is a 35 y.o. female.  She was recently admitted to [facility] for pyelonephritis.  She said she finished her antibiotics.  She continues to have flank pain left greater than right.  She was seen at [facility] again and had a CT angio chest that was negative for PE.  She has had nausea and vomiting and diarrhea since yesterday.  No fevers or chills.  No blood from above or below.  No chest pain or shortness of breath.  She was exposed to some people who ended up testing positive at work for [employer].  She is COVID vaccinated  The history is provided by the patient.  Flank Pain This is a new problem. The current episode started more than 1 week ago. The problem occurs constantly. The problem has not changed since onset.Associated symptoms include abdominal pain. Pertinent negatives include no chest pain, no headaches and no shortness of breath. Nothing aggravates the symptoms. Nothing relieves the symptoms. She has tried nothing for the symptoms. The treatment provided no relief.      Past Medical History:  Diagnosis Date   Anemia    Anxiety    Blood transfusion without reported diagnosis    both CS   Cocaine abuse ([place])    Diabetes mellitus without complication ([place])    GERD (gastroesophageal reflux disease)    has resolved   IBS (irritable bowel syndrome)    Ovarian cyst    Peritonitis, acute generalized ([place])    Pulmonary embolism ([facility])    SBO (small bowel obstruction) ([place])    Sepsis ([place])     Patient Active Problem List   Diagnosis Date Noted   Hypokalemia 03/22/2021   GERD (gastroesophageal reflux disease) 03/22/2021   Pyelonephritis 03/21/2021   Controlled type 2 diabetes mellitus with hyperglycemia ([place]) 05/10/2020   Partial small bowel obstruction ([place])  05/09/2020   Bartholin's gland abscess 02/21/2019   DKA (diabetic ketoacidoses) 02/20/2019   Blood per rectum 02/20/2019   Hyponatremia 09/26/2018   Chronic hypertension 01/03/2018   IUD contraception 01/03/2018   Pulmonary embolism during puerperium 12/04/2017   Pulmonary edema 11/30/2017   Pelvic adhesive disease 10/22/2017   Nasal septal perforation 10/17/2017   History of cocaine abuse ([place]) 10/17/2017   Epistaxis 10/17/2017   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   Obesity (BMI 30-39.9) 09/25/2017   Type 2 diabetes mellitus, without long-term current use of insulin ([place]) 08/23/2017   Chronic pain syndrome 08/23/2017    Past Surgical History:  Procedure Laterality Date   APPENDECTOMY     ruptured    BUNIONECTOMY     CESAREAN SECTION     CESAREAN SECTION Bilateral 11/20/2017   Procedure: CESAREAN SECTION;  Surgeon: Sloan Leiter, MD;  Location: [place];  Service: Obstetrics;  Laterality: Bilateral;   OVARIAN CYST DRAINAGE     SMALL BOWEL REPAIR       OB History     Gravida  3   Para  3   Term  1   Preterm  2   AB  0   Living  3      SAB  0   IAB  0   Ectopic  0   Multiple  0   Live Births  3           Family History  Problem Relation Age of Onset   Hypertension Mother    Anemia Mother    Diabetes Father     Social History   Tobacco Use   Smoking status: Light Smoker    Packs/day: 0.50    Pack years: 0.00    Types: Cigarettes   Smokeless tobacco: Never   Tobacco comments:    social use with tobacco  Vaping Use   Vaping Use: Some days  Substance Use Topics   Alcohol use: Yes    Comment: 3 times a month   Drug use: Not Currently    Home Medications Prior to Admission medications   Medication Sig Start Date End Date Taking? Authorizing Provider  acetaminophen (TYLENOL) 325 MG tablet Take 650 mg by mouth every 6 (six) hours as needed for mild pain, fever or headache.    [provider]  bisacodyl  (DULCOLAX) 5 MG EC tablet Take 1 tablet (5 mg total) by mouth daily as needed for moderate constipation or severe constipation. 03/25/21   Arrien, Jimmy Picket, MD  blood glucose meter kit and supplies KIT Dispense based on patient and insurance preference. Use up to four times daily as directed. (FOR ICD-9 250.00, 250.01). 02/21/19   Oretha Milch D, MD  cyclobenzaprine (FLEXERIL) 5 MG tablet Take 1 tablet (5 mg total) by mouth 3 (three) times daily as needed for muscle spasms. 03/25/21   Arrien, Jimmy Picket, MD  HUMULIN R 100 UNIT/ML injection Inject 20 Units into the skin 3 (three) times daily before meals. Per sliding scale 03/24/20   [provider]  insulin detemir (LEVEMIR) 100 unit/ml SOLN Inject 20 Units into the skin 3 (three) times daily. 03/02/21   Matilde Haymaker, MD  Levonorgestrel (LILETTA) 19.5 MCG/DAY IUD IUD 1 each by Intrauterine route continuous. Placed in 2019    [provider]  methocarbamol (ROBAXIN) 500 MG tablet Take 1 tablet (500 mg total) by mouth 2 (two) times daily. 03/27/21   Lacretia Leigh, MD  oxyCODONE-acetaminophen (PERCOCET/ROXICET) 5-325 MG tablet Take 1 tablet by mouth every 6 (six) hours as needed for moderate pain or severe pain. 06/01/20   [provider]  polyethylene glycol (MIRALAX / GLYCOLAX) 17 g packet Take 17 g by mouth daily. 03/26/21   Arrien, Jimmy Picket, MD    Allergies    Hydrocodone, Cetirizine & related, Toradol [ketorolac tromethamine], Flagyl [metronidazole], Contrast media [iodinated diagnostic agents], and Nsaids  Review of Systems   Review of Systems  Constitutional:  Negative for fever.  HENT:  Negative for sore throat.   Eyes:  Negative for visual disturbance.  Respiratory:  Negative for shortness of breath.   Cardiovascular:  Negative for chest pain.  Gastrointestinal:  Positive for abdominal pain, diarrhea, nausea and vomiting.  Genitourinary:  Positive for flank pain. Negative for dysuria.  Musculoskeletal:   Positive for back pain.  Skin:  Negative for rash.  Neurological:  Negative for headaches.   Physical Exam Updated Vital Signs BP (!) 173/113 (BP Location: Right Arm)   Pulse 99   Temp (!) 97.4 F (36.3 C) (Oral)   Resp 18   Ht '5\' 7"'  (1.702 m)   Wt 68 kg   SpO2 100%   BMI 23.49 kg/m   Physical Exam Vitals and nursing note reviewed.  Constitutional:      General: She is not in acute distress.    Appearance: Normal appearance. She is well-developed.  HENT:     Head: Normocephalic and atraumatic.  Eyes:     Conjunctiva/sclera: Conjunctivae normal.  Cardiovascular:     Rate and Rhythm: Normal rate and regular rhythm.     Heart sounds: No murmur heard. Pulmonary:     Effort: Pulmonary effort is normal. No respiratory distress.     Breath sounds: Normal breath sounds.  Abdominal:     Palpations: Abdomen is soft.     Tenderness: There is no abdominal tenderness. There is no guarding or rebound.  Musculoskeletal:        General: No deformity or signs of injury. Normal range of motion.     Cervical back: Neck supple.  Skin:    General: Skin is warm and dry.  Neurological:     General: No focal deficit present.     Mental Status: She is alert.    ED Results / Procedures / Treatments   Labs (all labs ordered are listed, but only abnormal results are displayed) Labs Reviewed  COMPREHENSIVE METABOLIC PANEL - Abnormal; Notable for the following components:      Result Value   Sodium 128 (*)    Chloride 93 (*)    Glucose, Bld 507 (*)    Calcium 8.7 (*)    All other components within normal limits  URINALYSIS, ROUTINE W REFLEX MICROSCOPIC - Abnormal; Notable for the following components:   Color, Urine STRAW (*)    Specific Gravity, Urine <1.005 (*)    Glucose, UA >=500 (*)    Hgb urine dipstick SMALL (*)    All other components within normal limits  URINALYSIS, MICROSCOPIC (REFLEX) - Abnormal; Notable for the following components:   Bacteria, UA RARE (*)     Trichomonas, UA PRESENT (*)    All other components within normal limits  CBG MONITORING, ED - Abnormal; Notable for the following components:   Glucose-Capillary 427 (*)    All other components within normal limits  URINE CULTURE  SARS CORONAVIRUS 2 (TAT 6-24 HRS)  LIPASE, BLOOD  CBC WITH DIFFERENTIAL/PLATELET  PREGNANCY, URINE    EKG None  Radiology CT Renal Stone Study  Result Date: 04/01/2021 CLINICAL DATA:  Flank pain. Reported recent bilateral pyelonephritis EXAM: CT ABDOMEN AND PELVIS WITHOUT CONTRAST TECHNIQUE: Multidetector CT imaging of the abdomen and pelvis was performed following the standard protocol without oral or IV contrast. COMPARISON:  Mar 21, 2021 FINDINGS: Lower chest: There is slight bibasilar atelectasis. Lung bases otherwise are clear. Hepatobiliary: No focal liver lesions are appreciable on this noncontrast enhanced study. The gallbladder wall is not appreciably thickened. There is no biliary duct dilatation. Pancreas: There is no appreciable pancreatic mass or inflammatory focus. Spleen: No splenic lesions are evident. Adrenals/Urinary Tract: Adrenals bilaterally appear normal. There is no appreciable renal mass on either side. There is slight fullness of each renal collecting system. No perinephric fluid or soft tissue stranding on either side. No appreciable renal or ureteral calculus on either side. Urinary bladder is midline with wall thickness within normal limits. Stomach/Bowel: There is no appreciable bowel wall or mesenteric thickening. There is no evident bowel obstruction. Terminal ileum appears normal. Appendix reportedly absent. No periappendiceal region inflammation. Vascular/Lymphatic: No abdominal aortic aneurysm. No vascular lesions are appreciable on this noncontrast enhanced study. There is no evident adenopathy in abdomen or pelvis. Reproductive: Uterus is anteverted. There is an intrauterine device positioned within the endometrium. No uterine or  adnexal masses. Other: No abscess or ascites evident in the abdomen or pelvis. Musculoskeletal: There  are no blastic or lytic bone lesions. No abdominal wall or intramuscular lesions. IMPRESSION: 1. Slight fullness of each renal collecting system without renal or ureteral calculus on each side. This appearance could represent residua of pyelonephritis. Note that there is no perinephric fluid or evidence of abscess involving either kidney. Urinary bladder wall is not thickened. 2. No bowel wall thickening or bowel obstruction. No abscess in the abdomen or pelvis. Absent appendix. 3.  Intrauterine device within the endometrium. Electronically Signed   By: Lowella Grip III M.D.   On: 04/01/2021 14:23    Procedures Procedures   Medications Ordered in ED Medications  morphine 4 MG/ML injection 4 mg (4 mg Intravenous Given 04/01/21 1141)  ondansetron (ZOFRAN) injection 4 mg (4 mg Intravenous Given 04/01/21 1139)  sodium chloride 0.9 % bolus 1,000 mL (0 mLs Intravenous Stopped 04/01/21 1331)  insulin aspart protamine- aspart (NOVOLOG MIX 70/30) injection 10 Units (10 Units Subcutaneous Given 04/01/21 1258)  morphine 4 MG/ML injection 4 mg (4 mg Intravenous Given 04/01/21 1329)  oxyCODONE-acetaminophen (PERCOCET/ROXICET) 5-325 MG per tablet 2 tablet (2 tablets Oral Given 04/01/21 1457)    ED Course  I have reviewed the triage vital signs and the nursing notes.  Pertinent labs & imaging results that were available during my care of the patient were reviewed by me and considered in my medical decision making (see chart for details).    MDM Rules/Calculators/A&P                         This patient complains of bilateral flank pain, nausea vomiting diarrhea, elevated blood sugars, elevated blood pressure; this involves an extensive number of treatment Options and is a complaint that carries with it a high risk of complications and Morbidity. The differential includes UTI, pyelonephritis, metabolic  derangement, DKA, renal failure, COVID  I ordered, reviewed and interpreted labs, which included CBC with normal white count normal hemoglobin, chemistries with a low sodium elevated glucose reflecting some pseudohyponatremia normal gap making DKA less likely, urinalysis without obvious signs of infection although trichomonas are present, COVID test pending at time of discharge I ordered medication IV fluids, subcu insulin, IV pain medication oral pain medication I ordered imaging studies which included CT renal and I independently    visualized and interpreted imaging which showed some slight fullness of the renal collecting system although no discrete stones evidence of pyelonephritis Previous records obtained and reviewed in epic including recent admission and ED visit for similar complaint  After the interventions stated above, I reevaluated the patient and found patient to be feeling better.  Blood pressure still elevated although marginally improved.  She is comfortable plan for outpatient antibiotics.  She will be in contact with her primary care doctor regarding further pain medication.  Return instructions discussed   Final Clinical Impression(s) / ED Diagnoses Final diagnoses:  Flank pain  Primary hypertension  Hyperglycemia  Trichomonal infection    Rx / DC Orders ED Discharge Orders          Ordered    cephALEXin (KEFLEX) 500 MG capsule  4 times daily        04/01/21 1435             Hayden Rasmussen, MD 04/01/21 1735

## 2021-04-01 NOTE — Discharge Instructions (Signed)
You were seen in the emergency department for continued flank pain.  Your blood pressure was elevated here and this will need to be followed with a primary care doctor.  Your lab work was unremarkable and your urine did not look like an obvious infection.  Your CT showed some inflammation around the tubes from the kidney to the bladder so we are putting you back on some antibiotics.  Please contact your primary care doctor for close follow-up.

## 2021-04-02 LAB — URINE CULTURE: Culture: 30000 — AB

## 2021-04-03 ENCOUNTER — Telehealth: Payer: Self-pay | Admitting: Emergency Medicine

## 2021-04-03 NOTE — Telephone Encounter (Signed)
Post ED Visit - Positive Culture Follow-up  Culture report reviewed by antimicrobial stewardship pharmacist: [place] Team []  Elenor Quinones, Pharm.D. []  Heide Guile, Pharm.D., BCPS AQ-ID []  Parks Neptune, Pharm.D., BCPS []  Alycia Rossetti, Pharm.D., BCPS []  [place], Pharm.D., BCPS, AAHIVP []  Legrand Como, Pharm.D., BCPS, AAHIVP []  Salome Arnt, PharmD, BCPS []  Johnnette Gourd, PharmD, BCPS []  Hughes Better, PharmD, BCPS []  Leeroy Cha, PharmD []  Laqueta Linden, PharmD, BCPS [x]  Albertina Parr, PharmD  [place] Team []  Leodis Sias, PharmD []  Lindell Spar, PharmD []  Royetta Asal, PharmD []  Graylin Shiver, Rph []  Rema Fendt) Glennon Mac, PharmD []  Arlyn Dunning, PharmD []  Netta Cedars, PharmD []  Dia Sitter, PharmD []  Leone Haven, PharmD []  Gretta Arab, PharmD []  Theodis Shove, PharmD []  Peggyann Juba, PharmD []  Reuel Boom, PharmD   Positive urine culture Treated with Cephalexin, organism sensitive to the same and no further patient follow-up is required at this time.  [name] [name] 04/03/2021, 1:21 PM

## 2021-04-08 ENCOUNTER — Emergency Department (HOSPITAL_BASED_OUTPATIENT_CLINIC_OR_DEPARTMENT_OTHER)
Admission: EM | Admit: 2021-04-08 | Discharge: 2021-04-08 | Disposition: A | Discharge status: Home / Self Care | Payer: Medicaid Other | Attending: Emergency Medicine | Admitting: Emergency Medicine

## 2021-04-08 ENCOUNTER — Other Ambulatory Visit: Payer: Self-pay

## 2021-04-08 ENCOUNTER — Encounter (HOSPITAL_BASED_OUTPATIENT_CLINIC_OR_DEPARTMENT_OTHER): Payer: Self-pay | Admitting: Emergency Medicine

## 2021-04-08 ENCOUNTER — Emergency Department (HOSPITAL_BASED_OUTPATIENT_CLINIC_OR_DEPARTMENT_OTHER): Payer: Medicaid Other

## 2021-04-08 DIAGNOSIS — Z79899 Other long term (current) drug therapy: Secondary | ICD-10-CM | POA: Insufficient documentation

## 2021-04-08 DIAGNOSIS — I10 Essential (primary) hypertension: Secondary | ICD-10-CM | POA: Insufficient documentation

## 2021-04-08 DIAGNOSIS — Z794 Long term (current) use of insulin: Secondary | ICD-10-CM | POA: Insufficient documentation

## 2021-04-08 DIAGNOSIS — R1084 Generalized abdominal pain: Secondary | ICD-10-CM | POA: Insufficient documentation

## 2021-04-08 DIAGNOSIS — F17210 Nicotine dependence, cigarettes, uncomplicated: Secondary | ICD-10-CM | POA: Insufficient documentation

## 2021-04-08 DIAGNOSIS — E119 Type 2 diabetes mellitus without complications: Secondary | ICD-10-CM | POA: Insufficient documentation

## 2021-04-08 DIAGNOSIS — K219 Gastro-esophageal reflux disease without esophagitis: Secondary | ICD-10-CM | POA: Diagnosis not present

## 2021-04-08 DIAGNOSIS — Z20822 Contact with and (suspected) exposure to covid-19: Secondary | ICD-10-CM | POA: Diagnosis not present

## 2021-04-08 DIAGNOSIS — R197 Diarrhea, unspecified: Secondary | ICD-10-CM | POA: Diagnosis not present

## 2021-04-08 DIAGNOSIS — Z86018 Personal history of other benign neoplasm: Secondary | ICD-10-CM | POA: Diagnosis not present

## 2021-04-08 DIAGNOSIS — R110 Nausea: Secondary | ICD-10-CM | POA: Insufficient documentation

## 2021-04-08 LAB — URINALYSIS, ROUTINE W REFLEX MICROSCOPIC
Bilirubin Urine: NEGATIVE
Glucose, UA: >=500 mg/dL — AB
Hgb urine dipstick: NEGATIVE
Ketones, ur: NEGATIVE mg/dL
Leukocytes,Ua: NEGATIVE
Nitrite: NEGATIVE
Protein, ur: NEGATIVE mg/dL
Specific Gravity, Urine: 1.01 (ref 1.005–1.030)
pH: 6.5 (ref 5.0–8.0)

## 2021-04-08 LAB — CBC
HCT: 39.8 % (ref 36.0–46.0)
Hemoglobin: 13.1 g/dL (ref 12.0–15.0)
MCH: 27.3 pg (ref 26.0–34.0)
MCHC: 32.9 g/dL (ref 30.0–36.0)
MCV: 82.9 fL (ref 80.0–100.0)
Platelets: 367 10*3/uL (ref 150–400)
RBC: 4.8 MIL/uL (ref 3.87–5.11)
RDW: 13.9 % (ref 11.5–15.5)
WBC: 6.4 10*3/uL (ref 4.0–10.5)
nRBC: 0 % (ref 0.0–0.2)

## 2021-04-08 LAB — LIPASE, BLOOD: Lipase: 27 U/L (ref 11–51)

## 2021-04-08 LAB — COMPREHENSIVE METABOLIC PANEL
ALT: 11 U/L (ref 0–44)
AST: 13 U/L — ABNORMAL LOW (ref 15–41)
Albumin: 3.6 g/dL (ref 3.5–5.0)
Alkaline Phosphatase: 80 U/L (ref 38–126)
Anion gap: 8 (ref 5–15)
BUN: 7 mg/dL (ref 6–20)
CO2: 30 mmol/L (ref 22–32)
Calcium: 8.8 mg/dL — ABNORMAL LOW (ref 8.9–10.3)
Chloride: 93 mmol/L — ABNORMAL LOW (ref 98–111)
Creatinine, Ser: 0.47 mg/dL (ref 0.44–1.00)
GFR, Estimated: >60 mL/min (ref >60)
Glucose, Bld: 405 mg/dL — ABNORMAL HIGH (ref 70–99)
Potassium: 3.1 mmol/L — ABNORMAL LOW (ref 3.5–5.1)
Sodium: 131 mmol/L — ABNORMAL LOW (ref 135–145)
Total Bilirubin: 0.5 mg/dL (ref 0.3–1.2)
Total Protein: 7.5 g/dL (ref 6.5–8.1)

## 2021-04-08 LAB — URINALYSIS, MICROSCOPIC (REFLEX)

## 2021-04-08 LAB — RESP PANEL BY RT-PCR (FLU A&B, COVID) ARPGX2
Influenza A by PCR: NEGATIVE
Influenza B by PCR: NEGATIVE
SARS Coronavirus 2 by RT PCR: NEGATIVE

## 2021-04-08 LAB — CBG MONITORING, ED: Glucose-Capillary: 368 mg/dL — ABNORMAL HIGH (ref 70–99)

## 2021-04-08 LAB — PREGNANCY, URINE: Preg Test, Ur: NEGATIVE

## 2021-04-08 MED ORDER — ACETAMINOPHEN 325 MG PO TABS
650.0000 mg | ORAL_TABLET | Freq: Once | ORAL | Status: AC
  Administered 2021-04-08: 650 mg via ORAL
  Filled 2021-04-08: qty 2

## 2021-04-08 MED ORDER — ONDANSETRON HCL 4 MG/2ML IJ SOLN
4.0000 mg | Freq: Once | INTRAMUSCULAR | Status: AC
  Administered 2021-04-08: 4 mg via INTRAVENOUS
  Filled 2021-04-08: qty 2

## 2021-04-08 MED ORDER — DICYCLOMINE HCL 20 MG PO TABS: 20.0000 mg | ORAL_TABLET | Freq: Two times a day (BID) | ORAL | 0 refills | Status: DC

## 2021-04-08 MED ORDER — MORPHINE SULFATE (PF) 2 MG/ML IV SOLN
2.0000 mg | Freq: Once | INTRAVENOUS | Status: AC
  Administered 2021-04-08: 2 mg via INTRAVENOUS
  Filled 2021-04-08: qty 1

## 2021-04-08 MED ORDER — ONDANSETRON 4 MG PO TBDP: 4.0000 mg | ORAL_TABLET | Freq: Three times a day (TID) | ORAL | 0 refills | Status: DC | PRN

## 2021-04-08 MED ORDER — LOPERAMIDE HCL 2 MG PO CAPS
4.0000 mg | ORAL_CAPSULE | Freq: Once | ORAL | Status: AC
  Administered 2021-04-08: 4 mg via ORAL
  Filled 2021-04-08: qty 2

## 2021-04-08 MED ORDER — DICYCLOMINE HCL 10 MG PO CAPS
20.0000 mg | ORAL_CAPSULE | Freq: Once | ORAL | Status: AC
  Administered 2021-04-08: 20 mg via ORAL
  Filled 2021-04-08: qty 2

## 2021-04-08 MED ORDER — POTASSIUM CHLORIDE CRYS ER 20 MEQ PO TBCR
40.0000 meq | EXTENDED_RELEASE_TABLET | Freq: Once | ORAL | Status: AC
  Administered 2021-04-08: 40 meq via ORAL
  Filled 2021-04-08: qty 2

## 2021-04-08 MED ORDER — SODIUM CHLORIDE 0.9 % IV BOLUS
1000.0000 mL | Freq: Once | INTRAVENOUS | Status: AC
  Administered 2021-04-08: 1000 mL via INTRAVENOUS

## 2021-04-08 MED ORDER — LOPERAMIDE HCL 2 MG PO CAPS: 2.0000 mg | ORAL_CAPSULE | Freq: Four times a day (QID) | ORAL | 0 refills | Status: DC | PRN

## 2021-04-08 MED ORDER — POTASSIUM CHLORIDE ER 10 MEQ PO TBCR: 10.0000 meq | EXTENDED_RELEASE_TABLET | Freq: Every day | ORAL | 0 refills | Status: DC

## 2021-04-08 MED ORDER — FAMOTIDINE IN NACL 20-0.9 MG/50ML-% IV SOLN
20.0000 mg | Freq: Once | INTRAVENOUS | Status: AC
  Administered 2021-04-08: 20 mg via INTRAVENOUS
  Filled 2021-04-08: qty 50

## 2021-04-08 NOTE — ED Triage Notes (Addendum)
Pt having upper abdominal pain with N/V/D for several days.  Pt recently treated for UTI

## 2021-04-08 NOTE — ED Provider Notes (Signed)
[facility] EMERGENCY DEPARTMENT Provider Note   CSN: [id number] Arrival date & time: 04/08/21  1033     History Chief Complaint  Patient presents with   Abdominal Pain    Jodi Mcgee is a 35 y.o. female.  HPI Patient is a 35 year old female past medical history detailed below.  She has a history notable for DM2 on insulin, anxiety, IBS, reflux, cocaine abuse  Patient is presented today with approximately 2 weeks of diarrhea.  She denies any vomiting but does state that she is occasionally nauseous.  Denies any fevers or chills.  States that she was admitted to the hospital for several days for pyelonephritis over 2 weeks ago.  She states that her diarrhea seem to start after she was discharged home.  I reviewed EMR it appears that she was only on Rocephin was never placed on broad-spectrum antibiotics.  She does not have any history of C. difficile.  She states that she ha no fever at home.  No other recent antibiotic use.  She does however describe diarrhea as watery.  States that she is having multiple episodes throughout the day seems to range between 3 and 6.  Denies any rectal pain.  Does have history of IBS.  States that she feels her symptoms are worse than typical IBS flare.  She is describing achy generalized abdominal pain that is all across her abdomen.  States that it is 7/10.  No other associate symptoms.  No aggravating mitigating factors.  Denies any urinary frequency urgency or dysuria.     Past Medical History:  Diagnosis Date   Anemia    Anxiety    Blood transfusion without reported diagnosis    both CS   Cocaine abuse ([place])    Diabetes mellitus without complication ([place])    GERD (gastroesophageal reflux disease)    has resolved   IBS (irritable bowel syndrome)    Ovarian cyst    Peritonitis, acute generalized ([place])    Pulmonary embolism (H[facility])    SBO (small bowel obstruction) ([place])    Sepsis ([place])     Patient Active Problem List    Diagnosis Date Noted   Hypokalemia 03/22/2021   GERD (gastroesophageal reflux disease) 03/22/2021   Pyelonephritis 03/21/2021   Controlled type 2 diabetes mellitus with hyperglycemia ([place]) 05/10/2020   Partial small bowel obstruction ([place]) 05/09/2020   Bartholin's gland abscess 02/21/2019   DKA (diabetic ketoacidoses) 02/20/2019   Blood per rectum 02/20/2019   Hyponatremia 09/26/2018   Chronic hypertension 01/03/2018   IUD contraception 01/03/2018   Pulmonary embolism during puerperium 12/04/2017   Pulmonary edema 11/30/2017   Pelvic adhesive disease 10/22/2017   Nasal septal perforation 10/17/2017   History of cocaine abuse ([place]) 10/17/2017   Epistaxis 10/17/2017   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   Obesity (BMI 30-39.9) 09/25/2017   Type 2 diabetes mellitus, without long-term current use of insulin ([place]) 08/23/2017   Chronic pain syndrome 08/23/2017    Past Surgical History:  Procedure Laterality Date   APPENDECTOMY     ruptured    BUNIONECTOMY     CESAREAN SECTION     CESAREAN SECTION Bilateral 11/20/2017   Procedure: CESAREAN SECTION;  Surgeon: Sloan Leiter, MD;  Location: [facility];  Service: Obstetrics;  Laterality: Bilateral;   OVARIAN CYST DRAINAGE     SMALL BOWEL REPAIR       OB History     Gravida  3   Para  3   Term  1   Preterm  2   AB  0   Living  3      SAB  0   IAB  0   Ectopic  0   Multiple  0   Live Births  3           Family History  Problem Relation Age of Onset   Hypertension Mother    Anemia Mother    Diabetes Father     Social History   Tobacco Use   Smoking status: Light Smoker    Packs/day: 0.50    Pack years: 0.00    Types: Cigarettes   Smokeless tobacco: Never   Tobacco comments:    social use with tobacco  Vaping Use   Vaping Use: Some days  Substance Use Topics   Alcohol use: Yes    Comment: 3 times a month   Drug use: Not Currently    Home Medications Prior to Admission  medications   Medication Sig Start Date End Date Taking? Authorizing Provider  dicyclomine (BENTYL) 20 MG tablet Take 1 tablet (20 mg total) by mouth 2 (two) times daily. 04/08/21  Yes Lendy Dittrich, Ova Freshwater S, PA  loperamide (IMODIUM) 2 MG capsule Take 1 capsule (2 mg total) by mouth 4 (four) times daily as needed for diarrhea or loose stools. 04/08/21  Yes Dereck Agerton S, PA  ondansetron (ZOFRAN ODT) 4 MG disintegrating tablet Take 1 tablet (4 mg total) by mouth every 8 (eight) hours as needed for nausea or vomiting. 04/08/21  Yes Mackenzye Mackel S, PA  potassium chloride (KLOR-CON) 10 MEQ tablet Take 1 tablet (10 mEq total) by mouth daily. 04/08/21  Yes Damean Poffenberger S, PA  blood glucose meter kit and supplies KIT Dispense based on patient and insurance preference. Use up to four times daily as directed. (FOR ICD-9 250.00, 250.01). 02/21/19   Oretha Milch D, MD  cephALEXin (KEFLEX) 500 MG capsule Take 1 capsule (500 mg total) by mouth 4 (four) times daily. 04/01/21   Hayden Rasmussen, MD  HUMULIN R 100 UNIT/ML injection Inject 20 Units into the skin 3 (three) times daily before meals. Per sliding scale 03/24/20   [provider]  insulin detemir (LEVEMIR) 100 unit/ml SOLN Inject 20 Units into the skin 3 (three) times daily. 03/02/21   Matilde Haymaker, MD  Levonorgestrel (LILETTA) 19.5 MCG/DAY IUD IUD 1 each by Intrauterine route continuous. Placed in 2019    [provider]  methocarbamol (ROBAXIN) 500 MG tablet Take 1 tablet (500 mg total) by mouth 2 (two) times daily. 03/27/21   Lacretia Leigh, MD  oxyCODONE-acetaminophen (PERCOCET/ROXICET) 5-325 MG tablet Take 1 tablet by mouth every 6 (six) hours as needed for moderate pain or severe pain. 06/01/20   [provider]  polyethylene glycol (MIRALAX / GLYCOLAX) 17 g packet Take 17 g by mouth daily. 03/26/21   Arrien, Jimmy Picket, MD    Allergies    Hydrocodone, Iodinated diagnostic agents, Cetirizine & related, Toradol [ketorolac  tromethamine], Flagyl [metronidazole], and Nsaids  Review of Systems   Review of Systems  Constitutional:  Positive for fatigue. Negative for chills and fever.  HENT:  Negative for congestion.   Eyes:  Negative for pain.  Respiratory:  Negative for cough and shortness of breath.   Cardiovascular:  Negative for chest pain and leg swelling.  Gastrointestinal:  Positive for abdominal pain, diarrhea and nausea. Negative for vomiting.  Genitourinary:  Negative for dysuria.  Musculoskeletal:  Negative for myalgias.  Skin:  Negative for rash.  Neurological:  Negative for dizziness and headaches.   Physical Exam Updated Vital Signs BP (!) 148/104 (BP Location: Right Arm)   Pulse (!) 109   Temp 98 F (36.7 C) (Oral)   Resp 18   Ht '5\' 7"'  (1.702 m)   Wt 69.9 kg   SpO2 99%   BMI 24.12 kg/m   Physical Exam Vitals and nursing note reviewed.  Constitutional:      General: She is not in acute distress.    Comments: Patient appears uncomfortable but is in no acute distress.  35 year old female appears stated age.  HENT:     Head: Normocephalic and atraumatic.     Nose: Nose normal.  Eyes:     General: No scleral icterus. Cardiovascular:     Rate and Rhythm: Normal rate and regular rhythm.     Pulses: Normal pulses.     Heart sounds: Normal heart sounds.  Pulmonary:     Effort: Pulmonary effort is normal. No respiratory distress.     Breath sounds: No wheezing.  Abdominal:     Palpations: Abdomen is soft.     Tenderness: There is abdominal tenderness.     Comments: Patient with soft nondistended abdomen.  No tenderness guarding or rebound.  No CVA tenderness.  Musculoskeletal:     Cervical back: Normal range of motion.     Right lower leg: No edema.     Left lower leg: No edema.  Skin:    General: Skin is warm and dry.     Capillary Refill: Capillary refill takes less than 2 seconds.  Neurological:     Mental Status: She is alert. Mental status is at baseline.  Psychiatric:         Mood and Affect: Mood normal.        Behavior: Behavior normal.    ED Results / Procedures / Treatments   Labs (all labs ordered are listed, but only abnormal results are displayed) Labs Reviewed  COMPREHENSIVE METABOLIC PANEL - Abnormal; Notable for the following components:      Result Value   Sodium 131 (*)    Potassium 3.1 (*)    Chloride 93 (*)    Glucose, Bld 405 (*)    Calcium 8.8 (*)    AST 13 (*)    All other components within normal limits  URINALYSIS, ROUTINE W REFLEX MICROSCOPIC - Abnormal; Notable for the following components:   Glucose, UA >=500 (*)    All other components within normal limits  URINALYSIS, MICROSCOPIC (REFLEX) - Abnormal; Notable for the following components:   Bacteria, UA RARE (*)    All other components within normal limits  CBG MONITORING, ED - Abnormal; Notable for the following components:   Glucose-Capillary 368 (*)    All other components within normal limits  RESP PANEL BY RT-PCR (FLU A&B, COVID) ARPGX2  URINE CULTURE  GASTROINTESTINAL PANEL BY PCR, STOOL (REPLACES STOOL CULTURE)  LIPASE, BLOOD  CBC  PREGNANCY, URINE    EKG None  Radiology CT ABDOMEN PELVIS WO CONTRAST  Result Date: 04/08/2021 CLINICAL DATA:  Left lower quadrant abdominal pain, nausea, vomiting, diarrhea. Recent UTI EXAM: CT ABDOMEN AND PELVIS WITHOUT CONTRAST TECHNIQUE: Multidetector CT imaging of the abdomen and pelvis was performed following the standard protocol without IV contrast. COMPARISON:  04/01/2016 FINDINGS: Lower chest: No acute abnormality. Hepatobiliary: Unremarkable unenhanced appearance of the liver. No focal liver lesion identified. Gallbladder within normal limits. No hyperdense gallstone. No biliary dilatation. Pancreas: Unremarkable.  No pancreatic ductal dilatation or surrounding inflammatory changes. Spleen: Normal in size without focal abnormality. Adrenals/Urinary Tract: Adrenal glands are unremarkable. Kidneys are normal, without renal  calculi, focal lesion, or hydronephrosis. Bladder is unremarkable. Stomach/Bowel: Evaluation of the bowel is limited in the absence of intravenous or oral contrast. A small amount of high-density material within the distal esophagus and stomach is noted, which may reflect a small amount of contrast or bismuth related product. Stomach within normal limits. No dilated loops of bowel. No focal bowel wall thickening or inflammatory changes. Vascular/Lymphatic: No significant vascular findings are present. No enlarged abdominal or pelvic lymph nodes. Reproductive: IUD present within the uterus.  No adnexal masses. Other: No free fluid. No abdominopelvic fluid collection. No pneumoperitoneum. No abdominal wall hernia. Musculoskeletal: No acute or significant osseous findings. IMPRESSION: No acute abdominopelvic findings. Electronically Signed   By: Davina Poke D.O.   On: 04/08/2021 15:21    Procedures Procedures   Medications Ordered in ED Medications  sodium chloride 0.9 % bolus 1,000 mL (0 mLs Intravenous Stopped 04/08/21 1330)  morphine 2 MG/ML injection 2 mg (2 mg Intravenous Given 04/08/21 1143)  ondansetron (ZOFRAN) injection 4 mg (4 mg Intravenous Given 04/08/21 1143)  famotidine (PEPCID) IVPB 20 mg premix (0 mg Intravenous Stopped 04/08/21 1218)  dicyclomine (BENTYL) capsule 20 mg (20 mg Oral Given 04/08/21 1203)  potassium chloride SA (KLOR-CON) CR tablet 40 mEq (40 mEq Oral Given 04/08/21 1354)  acetaminophen (TYLENOL) tablet 650 mg (650 mg Oral Given 04/08/21 1352)  loperamide (IMODIUM) capsule 4 mg (4 mg Oral Given 04/08/21 1352)  morphine 2 MG/ML injection 2 mg (2 mg Intravenous Given 04/08/21 1356)    ED Course  I have reviewed the triage vital signs and the nursing notes.  Pertinent labs & imaging results that were available during my care of the patient were reviewed by me and considered in my medical decision making (see chart for details).  Clinical Course as of 04/08/21 1610  Fri Apr 08, 2021  1036 Reviewed patient's history in EMR it appears that she was admitted to the hospital for pyelonephritis 2 weeks ago.  E. coli grew out in urine culture.  Seems that patient is a frequent visitor of emergency department. [WF]  1227 WBC: 6.4 Lab work notable for lack of leukocytosis.  Her symptoms have been ongoing for greater than a week at this point and I have low suspicion for bacterial infection given that she is afebrile without leukocytosis.  Suspect that her tachycardia at triage was likely due to pain and some dehydration. [WF]  1228 Patient does not have any red flag symptoms/red flag comorbidities for severe infection apart from recent antibiotics which may increase her risk of C. difficile. [WF]  1323 Urinalysis with rare bacteria negative leukocytes and nitrates.  No evidence of infection.  COVID influenza negative.  Urine pregnancy test negative. [WF]  1324 CMP with hyperglycemia of 405.  Patient is receiving fluids at this time.  We will recheck CBG when fluids are complete.  No anion gap bicarb within normal limits. [WF]  1610 Re-evaluated pt who is feeling improved but continues to be TTP more LLQ focality now. Will obtain CT WO contrast to eval for possible diverticulitis.  [WF]    Clinical Course User Index [WF] Tedd Sias, [place]   MDM Rules/Calculators/A&P                          CT scan  abdomen pelvis without contrast negative.  No explanation for patient's symptoms.  No evidence of diverticulitis.  Patient reevaluated she states that her pain is much improved.  On reevaluation radiation just prior to discharge patient's heart rate was 100 which is improved from 115 on presentation.  She has had some IV hydration she states she will restart her insulin her blood sugar is under 400 and will be readable on glucometer.  Suspect dehydration and hypokalemia due to GI losses.  Return precautions given and close follow-up instructions.  Will take loperamide, Bentyl,  potassium and Zofran.  Discussed with attending physician prior to discharge no further changes to plan made.  Patient is agreeable to plan.  Understanding of instructions.  Final Clinical Impression(s) / ED Diagnoses Final diagnoses:  Diarrhea, unspecified type  Generalized abdominal pain    Rx / DC Orders ED Discharge Orders          Ordered    ondansetron (ZOFRAN ODT) 4 MG disintegrating tablet  Every 8 hours PRN        04/08/21 1355    loperamide (IMODIUM) 2 MG capsule  4 times daily PRN        04/08/21 1355    dicyclomine (BENTYL) 20 MG tablet  2 times daily        04/08/21 1355    potassium chloride (KLOR-CON) 10 MEQ tablet  Daily        04/08/21 1355             Pati Gallo [place], [place] 04/08/21 1612    Luna Fuse, MD 04/10/21 1541

## 2021-04-08 NOTE — Discharge Instructions (Addendum)
Your work-up today was overall quite reassuring. Please drink plenty of water you may also take Pedialyte and for hydration.  You may use Zofran as needed for nausea which I prescribed you I also recommend using loperamide which is an over-the-counter medicine.  I have written a prescription for a prescription for you.  I have also written a prescription for Bentyl.  This should help with the abdominal pain.  If you are still taking Dulcolax please stop taking this.  I have also given you a prescription for potassium for 4 days of a low  dose -- given that you are likely losing some of this from your diarrhea.  Follow-up with your primary care provider you can have repeat labs checked to make sure your potassium is back within normal limits.  Your blood sugar was somewhat elevated today.  It is approximately 360 currently.  Please continue taking insulin.

## 2021-04-08 NOTE — ED Notes (Signed)
Pt had some diarrhea when she first arrived to ED but has not had any other episodes while in the ED

## 2021-04-10 LAB — URINE CULTURE

## 2021-04-15 ENCOUNTER — Emergency Department (HOSPITAL_COMMUNITY)
Admission: EM | Admit: 2021-04-15 | Discharge: 2021-04-15 | Disposition: A | Discharge status: Home / Self Care | Payer: Medicaid Other | Attending: Emergency Medicine | Admitting: Emergency Medicine

## 2021-04-15 ENCOUNTER — Encounter (HOSPITAL_COMMUNITY): Payer: Self-pay | Admitting: Emergency Medicine

## 2021-04-15 ENCOUNTER — Other Ambulatory Visit: Payer: Self-pay

## 2021-04-15 ENCOUNTER — Emergency Department (HOSPITAL_COMMUNITY): Payer: Medicaid Other

## 2021-04-15 DIAGNOSIS — R1012 Left upper quadrant pain: Secondary | ICD-10-CM | POA: Diagnosis not present

## 2021-04-15 DIAGNOSIS — F17290 Nicotine dependence, other tobacco product, uncomplicated: Secondary | ICD-10-CM | POA: Diagnosis not present

## 2021-04-15 DIAGNOSIS — R0789 Other chest pain: Secondary | ICD-10-CM | POA: Insufficient documentation

## 2021-04-15 DIAGNOSIS — R109 Unspecified abdominal pain: Secondary | ICD-10-CM | POA: Diagnosis present

## 2021-04-15 DIAGNOSIS — I10 Essential (primary) hypertension: Secondary | ICD-10-CM | POA: Insufficient documentation

## 2021-04-15 DIAGNOSIS — Z794 Long term (current) use of insulin: Secondary | ICD-10-CM | POA: Insufficient documentation

## 2021-04-15 DIAGNOSIS — R079 Chest pain, unspecified: Secondary | ICD-10-CM

## 2021-04-15 DIAGNOSIS — R112 Nausea with vomiting, unspecified: Secondary | ICD-10-CM | POA: Insufficient documentation

## 2021-04-15 DIAGNOSIS — E1110 Type 2 diabetes mellitus with ketoacidosis without coma: Secondary | ICD-10-CM | POA: Insufficient documentation

## 2021-04-15 LAB — CBC WITH DIFFERENTIAL/PLATELET
Abs Immature Granulocytes: 0.01 10*3/uL (ref 0.00–0.07)
Basophils Absolute: 0 10*3/uL (ref 0.0–0.1)
Basophils Relative: 1 %
Eosinophils Absolute: 0.1 10*3/uL (ref 0.0–0.5)
Eosinophils Relative: 2 %
HCT: 42.4 % (ref 36.0–46.0)
Hemoglobin: 13.6 g/dL (ref 12.0–15.0)
Immature Granulocytes: 0 %
Lymphocytes Relative: 27 %
Lymphs Abs: 1.3 10*3/uL (ref 0.7–4.0)
MCH: 27.1 pg (ref 26.0–34.0)
MCHC: 32.1 g/dL (ref 30.0–36.0)
MCV: 84.5 fL (ref 80.0–100.0)
Monocytes Absolute: 0.3 10*3/uL (ref 0.1–1.0)
Monocytes Relative: 7 %
Neutro Abs: 3.1 10*3/uL (ref 1.7–7.7)
Neutrophils Relative %: 63 %
Platelets: 286 10*3/uL (ref 150–400)
RBC: 5.02 MIL/uL (ref 3.87–5.11)
RDW: 14 % (ref 11.5–15.5)
WBC: 4.9 10*3/uL (ref 4.0–10.5)
nRBC: 0 % (ref 0.0–0.2)

## 2021-04-15 LAB — COMPREHENSIVE METABOLIC PANEL
ALT: 11 U/L (ref 0–44)
AST: 12 U/L — ABNORMAL LOW (ref 15–41)
Albumin: 3.9 g/dL (ref 3.5–5.0)
Alkaline Phosphatase: 88 U/L (ref 38–126)
Anion gap: 10 (ref 5–15)
BUN: 6 mg/dL (ref 6–20)
CO2: 27 mmol/L (ref 22–32)
Calcium: 9.1 mg/dL (ref 8.9–10.3)
Chloride: 97 mmol/L — ABNORMAL LOW (ref 98–111)
Creatinine, Ser: 0.54 mg/dL (ref 0.44–1.00)
GFR, Estimated: >60 mL/min (ref >60)
Glucose, Bld: 525 mg/dL (ref 70–99)
Potassium: 3.3 mmol/L — ABNORMAL LOW (ref 3.5–5.1)
Sodium: 134 mmol/L — ABNORMAL LOW (ref 135–145)
Total Bilirubin: 0.7 mg/dL (ref 0.3–1.2)
Total Protein: 7.7 g/dL (ref 6.5–8.1)

## 2021-04-15 LAB — LIPASE, BLOOD: Lipase: 30 U/L (ref 11–51)

## 2021-04-15 LAB — TROPONIN I (HIGH SENSITIVITY): Troponin I (High Sensitivity): 3 ng/L (ref <18)

## 2021-04-15 LAB — CBG MONITORING, ED: Glucose-Capillary: 184 mg/dL — ABNORMAL HIGH (ref 70–99)

## 2021-04-15 LAB — HCG, QUANTITATIVE, PREGNANCY: hCG, Beta Chain, Quant, S: <1 m[IU]/mL (ref <5)

## 2021-04-15 MED ORDER — OXYCODONE-ACETAMINOPHEN 5-325 MG PO TABS
1.0000 | ORAL_TABLET | Freq: Once | ORAL | Status: AC
  Administered 2021-04-15: 1 via ORAL
  Filled 2021-04-15: qty 1

## 2021-04-15 MED ORDER — DIPHENHYDRAMINE HCL 25 MG PO CAPS
25.0000 mg | ORAL_CAPSULE | Freq: Once | ORAL | Status: AC
  Administered 2021-04-15: 25 mg via ORAL
  Filled 2021-04-15: qty 1

## 2021-04-15 MED ORDER — DICYCLOMINE HCL 10 MG PO CAPS
10.0000 mg | ORAL_CAPSULE | Freq: Once | ORAL | Status: AC
  Administered 2021-04-15: 10 mg via ORAL
  Filled 2021-04-15: qty 1

## 2021-04-15 MED ORDER — PROMETHAZINE HCL 25 MG PO TABS: 25.0000 mg | ORAL_TABLET | Freq: Four times a day (QID) | ORAL | 0 refills | Status: DC | PRN

## 2021-04-15 MED ORDER — SODIUM CHLORIDE 0.9 % IV BOLUS: 1000.0000 mL | Freq: Once | INTRAVENOUS | Status: DC

## 2021-04-15 MED ORDER — INSULIN ASPART 100 UNIT/ML IJ SOLN
15.0000 [IU] | Freq: Once | INTRAMUSCULAR | Status: AC
  Administered 2021-04-15: 15 [IU] via SUBCUTANEOUS
  Filled 2021-04-15: qty 0.15

## 2021-04-15 MED ORDER — HYOSCYAMINE SULFATE 0.125 MG SL SUBL: 0.1250 mg | SUBLINGUAL_TABLET | SUBLINGUAL | 0 refills | Status: DC | PRN

## 2021-04-15 NOTE — ED Triage Notes (Signed)
Patient arrives complaining of continuing upper abdominal pain with radiation under her left breast after c. Diff from multiple abx use. Patient evaluated for same multiple times.

## 2021-04-15 NOTE — ED Provider Notes (Signed)
[facility]-EMERGENCY DEPT Provider Note   CSN: [id number] Arrival date & time: 04/15/21  0246     History Chief Complaint  Patient presents with   Abdominal Pain   Chest Pain    Jodi Mcgee is a 35 y.o. female.  Patient presents to the emergency department for evaluation of abdominal pain with nausea and vomiting.  Patient reports that the pain is mostly on the left side and goes up into the lung as well.  Patient has had nausea and vomiting associated with the pain.      Past Medical History:  Diagnosis Date   Anemia    Anxiety    Blood transfusion without reported diagnosis    both CS   Cocaine abuse (HCC)    Diabetes mellitus without complication (HCC)    GERD (gastroesophageal reflux disease)    has resolved   IBS (irritable bowel syndrome)    Ovarian cyst    Peritonitis, acute generalized (HCC)    Pulmonary embolism (HCC)    SBO (small bowel obstruction) (HCC)    Sepsis (HCC)     Patient Active Problem List   Diagnosis Date Noted   Hypokalemia 03/22/2021   GERD (gastroesophageal reflux disease) 03/22/2021   Pyelonephritis 03/21/2021   Controlled type 2 diabetes mellitus with hyperglycemia (HCC) 05/10/2020   Partial small bowel obstruction (HCC) 05/09/2020   Bartholin's gland abscess 02/21/2019   DKA (diabetic ketoacidoses) 02/20/2019   Blood per rectum 02/20/2019   Hyponatremia 09/26/2018   Chronic hypertension 01/03/2018   IUD contraception 01/03/2018   Pulmonary embolism during puerperium 12/04/2017   Pulmonary edema 11/30/2017   Pelvic adhesive disease 10/22/2017   Nasal septal perforation 10/17/2017   History of cocaine abuse (HCC) 10/17/2017   Epistaxis 10/17/2017   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   Obesity (BMI 30-39.9) 09/25/2017   Type 2 diabetes mellitus, without long-term current use of insulin (HCC) 08/23/2017   Chronic pain syndrome 08/23/2017    Past Surgical History:  Procedure Laterality Date    APPENDECTOMY     ruptured    BUNIONECTOMY     CESAREAN SECTION     CESAREAN SECTION Bilateral 11/20/2017   Procedure: CESAREAN SECTION;  Surgeon: Conan Bowens, MD;  Location: [facility] BIRTHING SUITES;  Service: Obstetrics;  Laterality: Bilateral;   OVARIAN CYST DRAINAGE     SMALL BOWEL REPAIR       OB History     Gravida  3   Para  3   Term  1   Preterm  2   AB  0   Living  3      SAB  0   IAB  0   Ectopic  0   Multiple  0   Live Births  3           Family History  Problem Relation Age of Onset   Hypertension Mother    Anemia Mother    Diabetes Father     Social History   Tobacco Use   Smoking status: Light Smoker    Packs/day: 0.50    Pack years: 0.00    Types: Cigarettes   Smokeless tobacco: Never   Tobacco comments:    social use with tobacco  Vaping Use   Vaping Use: Some days  Substance Use Topics   Alcohol use: Yes    Comment: 3 times a month   Drug use: Not Currently    Home Medications Prior to Admission medications  Medication Sig Start Date End Date Taking? Authorizing Provider  blood glucose meter kit and supplies KIT Dispense based on patient and insurance preference. Use up to four times daily as directed. (FOR ICD-9 250.00, 250.01). 02/21/19   Oretha Milch D, MD  cephALEXin (KEFLEX) 500 MG capsule Take 1 capsule (500 mg total) by mouth 4 (four) times daily. 04/01/21   Hayden Rasmussen, MD  HUMULIN R 100 UNIT/ML injection Inject 20 Units into the skin 3 (three) times daily before meals. Per sliding scale 03/24/20   [provider]  hyoscyamine (LEVSIN SL) 0.125 MG SL tablet Place 1 tablet (0.125 mg total) under the tongue every 4 (four) hours as needed for cramping (abdominal pain). 04/15/21  Yes Joahan Swatzell, Gwenyth Allegra, MD  insulin detemir (LEVEMIR) 100 unit/ml SOLN Inject 20 Units into the skin 3 (three) times daily. 03/02/21   Matilde Haymaker, MD  Levonorgestrel (LILETTA) 19.5 MCG/DAY IUD IUD 1 each by Intrauterine route  continuous. Placed in 2019    [provider]  loperamide (IMODIUM) 2 MG capsule Take 1 capsule (2 mg total) by mouth 4 (four) times daily as needed for diarrhea or loose stools. 04/08/21   Tedd Sias, PA  methocarbamol (ROBAXIN) 500 MG tablet Take 1 tablet (500 mg total) by mouth 2 (two) times daily. 03/27/21   Lacretia Leigh, MD  ondansetron (ZOFRAN ODT) 4 MG disintegrating tablet Take 1 tablet (4 mg total) by mouth every 8 (eight) hours as needed for nausea or vomiting. 04/08/21   Tedd Sias, PA  oxyCODONE-acetaminophen (PERCOCET/ROXICET) 5-325 MG tablet Take 1 tablet by mouth every 6 (six) hours as needed for moderate pain or severe pain. 06/01/20   [provider]  polyethylene glycol (MIRALAX / GLYCOLAX) 17 g packet Take 17 g by mouth daily. 03/26/21   Arrien, Jimmy Picket, MD  potassium chloride (KLOR-CON) 10 MEQ tablet Take 1 tablet (10 mEq total) by mouth daily. 04/08/21   Tedd Sias, PA  promethazine (PHENERGAN) 25 MG tablet Take 1 tablet (25 mg total) by mouth every 6 (six) hours as needed for nausea or vomiting. 04/15/21  Yes Gladys Deckard, Gwenyth Allegra, MD    Allergies    Hydrocodone, Iodinated diagnostic agents, Cetirizine & related, Toradol [ketorolac tromethamine], Flagyl [metronidazole], and Nsaids  Review of Systems   Review of Systems  Cardiovascular:  Positive for chest pain.  Gastrointestinal:  Positive for abdominal pain, nausea and vomiting.  All other systems reviewed and are negative.  Physical Exam Updated Vital Signs BP (!) 160/114 (BP Location: Left Arm)   Pulse (!) 103   Temp 98.4 F (36.9 C) (Oral)   Resp 16   Ht $R'5\' 7"'so$  (1.702 m)   Wt 68 kg   SpO2 100%   BMI 23.49 kg/m   Physical Exam Vitals and nursing note reviewed.  Constitutional:      General: She is not in acute distress.    Appearance: Normal appearance. She is well-developed.  HENT:     Head: Normocephalic and atraumatic.     Right Ear: Hearing normal.     Left Ear:  Hearing normal.     Nose: Nose normal.  Eyes:     Conjunctiva/sclera: Conjunctivae normal.     Pupils: Pupils are equal, round, and reactive to light.  Cardiovascular:     Rate and Rhythm: Regular rhythm.     Heart sounds: S1 normal and S2 normal. No murmur heard.   No friction rub. No gallop.  Pulmonary:  Effort: Pulmonary effort is normal. No respiratory distress.     Breath sounds: Normal breath sounds.  Chest:     Chest wall: Tenderness (Left-sided anterior) present.  Abdominal:     General: Bowel sounds are normal.     Palpations: Abdomen is soft.     Tenderness: There is abdominal tenderness in the left upper quadrant and left lower quadrant. There is no guarding or rebound. Negative signs include Murphy's sign and McBurney's sign.     Hernia: No hernia is present.  Musculoskeletal:        General: Normal range of motion.     Cervical back: Normal range of motion and neck supple.  Skin:    General: Skin is warm and dry.     Findings: No rash.  Neurological:     Mental Status: She is alert and oriented to person, place, and time.     GCS: GCS eye subscore is 4. GCS verbal subscore is 5. GCS motor subscore is 6.     Cranial Nerves: No cranial nerve deficit.     Sensory: No sensory deficit.     Coordination: Coordination normal.  Psychiatric:        Speech: Speech normal.        Behavior: Behavior normal.        Thought Content: Thought content normal.    ED Results / Procedures / Treatments   Labs (all labs ordered are listed, but only abnormal results are displayed) Labs Reviewed  COMPREHENSIVE METABOLIC PANEL - Abnormal; Notable for the following components:      Result Value   Sodium 134 (*)    Potassium 3.3 (*)    Chloride 97 (*)    Glucose, Bld 525 (*)    AST 12 (*)    All other components within normal limits  CBG MONITORING, ED - Abnormal; Notable for the following components:   Glucose-Capillary 184 (*)    All other components within normal limits   CBC WITH DIFFERENTIAL/PLATELET  LIPASE, BLOOD  HCG, QUANTITATIVE, PREGNANCY  TROPONIN I (HIGH SENSITIVITY)  TROPONIN I (HIGH SENSITIVITY)    EKG EKG Interpretation  Date/Time:  Friday April 15 2021 02:59:57 EDT Ventricular Rate:  99 PR Interval:  136 QRS Duration: 88 QT Interval:  336 QTC Calculation: 432 R Axis:   71 Text Interpretation: Sinus rhythm Probable left atrial enlargement 12 Lead; Mason-Likar Confirmed by Orpah Greek [phone]) on 04/15/2021 3:07:46 AM  Radiology DG ABD ACUTE 2+V W 1V CHEST  Result Date: 04/15/2021 CLINICAL DATA:  Abdominal pain. History of small-bowel obstruction. Nausea. EXAM: DG ABDOMEN ACUTE WITH 1 VIEW CHEST COMPARISON:  CT abdomen pelvis 04/08/2021 FINDINGS: There is no evidence of dilated bowel loops or free intraperitoneal air. Stool throughout the ascending, distal transverse, descending colon. No radiopaque calculi or other significant radiographic abnormality is seen. T-shaped intra device overlies pelvis. Surgical clips again noted along the distal descending colon. The heart size and mediastinal contours are within normal limits. No focal consolidation. No pulmonary edema. No pleural effusion. No pneumothorax. No acute osseous abnormality. IMPRESSION: 1. Nonobstructive bowel gas pattern. 2. T-shaped intrauterine device overlies the pelvis. 3. No acute cardiopulmonary disease. Electronically Signed   By: Iven Finn M.D.   On: 04/15/2021 06:01    Procedures Procedures   Medications Ordered in ED Medications  sodium chloride 0.9 % bolus 1,000 mL (has no administration in time range)  oxyCODONE-acetaminophen (PERCOCET/ROXICET) 5-325 MG per tablet 1 tablet (1 tablet Oral Given 04/15/21 0400)  dicyclomine (BENTYL) capsule 10 mg (10 mg Oral Given 04/15/21 0400)  diphenhydrAMINE (BENADRYL) capsule 25 mg (25 mg Oral Given 04/15/21 0400)  insulin aspart (novoLOG) injection 15 Units (15 Units Subcutaneous Given 04/15/21 0501)    ED Course   I have reviewed the triage vital signs and the nursing notes.  Pertinent labs & imaging results that were available during my care of the patient were reviewed by me and considered in my medical decision making (see chart for details).    MDM Rules/Calculators/A&P                          Patient presents with complaints of left-sided chest and abdominal pain.  Patient has been seen several times for this in the ED over the last week or so.  She has had a CT angiography and two CTs of the abdomen and pelvis.  These have all been normal.  Patient has undergone cardiac evaluation here and it has been reassuring.  No EKG changes, normal troponins.  An x-ray does not show any evidence of obstruction.  Patient treated with Percocet and Bentyl, has had some improvement.  Patient has been hyperglycemic.  This has resolved with administration of insulin.  Discharged with symptomatic treatment, follow-up with primary care.  Final Clinical Impression(s) / ED Diagnoses Final diagnoses:  Left upper quadrant abdominal pain  Chest pain, unspecified type    Rx / DC Orders ED Discharge Orders          Ordered    promethazine (PHENERGAN) 25 MG tablet  Every 6 hours PRN        04/15/21 0655    hyoscyamine (LEVSIN SL) 0.125 MG SL tablet  Every 4 hours PRN        04/15/21 0655             Orpah Greek, MD 04/15/21 [phone]

## 2021-04-15 NOTE — ED Provider Notes (Signed)
MSE was initiated and I personally evaluated the patient and placed orders (if any) at  3:09 AM on April 15, 2021.  Patient presents to the emergency department for evaluation of severe left-sided abdominal and chest pain.  Patient reports that she has been having problems since she was hospitalized for pyelonephritis.  Gen - appears uncomfortable Pulm - CTA Cardiac - nl heart sounds, borderline tachycardia Abd - soft, generalized tenderness, bowel sounds present  Patient seen in the ED several times for these complaints in the last 1 week or so.  Patient has had a CT angio, CT renal stone study, CT abdomen and pelvis over the last week without any acute findings.  Will perform basic cardiac and abdominal labs, plain film x-ray to rule out SBO as she does have a history.  The patient appears stable so that the remainder of the MSE may be completed by another provider.   Orpah Greek, MD 04/15/21 [phone]

## 2021-04-24 ENCOUNTER — Emergency Department (HOSPITAL_COMMUNITY): Payer: Medicaid Other

## 2021-04-24 ENCOUNTER — Emergency Department (HOSPITAL_COMMUNITY)
Admission: EM | Admit: 2021-04-24 | Discharge: 2021-04-25 | Disposition: A | Discharge status: Home / Self Care | Payer: Medicaid Other | Attending: Emergency Medicine | Admitting: Emergency Medicine

## 2021-04-24 ENCOUNTER — Encounter (HOSPITAL_COMMUNITY): Payer: Self-pay | Admitting: Emergency Medicine

## 2021-04-24 ENCOUNTER — Other Ambulatory Visit: Payer: Self-pay

## 2021-04-24 DIAGNOSIS — E1110 Type 2 diabetes mellitus with ketoacidosis without coma: Secondary | ICD-10-CM | POA: Insufficient documentation

## 2021-04-24 DIAGNOSIS — E669 Obesity, unspecified: Secondary | ICD-10-CM | POA: Diagnosis not present

## 2021-04-24 DIAGNOSIS — K219 Gastro-esophageal reflux disease without esophagitis: Secondary | ICD-10-CM | POA: Diagnosis not present

## 2021-04-24 DIAGNOSIS — I10 Essential (primary) hypertension: Secondary | ICD-10-CM | POA: Insufficient documentation

## 2021-04-24 DIAGNOSIS — E1169 Type 2 diabetes mellitus with other specified complication: Secondary | ICD-10-CM | POA: Insufficient documentation

## 2021-04-24 DIAGNOSIS — F17210 Nicotine dependence, cigarettes, uncomplicated: Secondary | ICD-10-CM | POA: Insufficient documentation

## 2021-04-24 DIAGNOSIS — N390 Urinary tract infection, site not specified: Secondary | ICD-10-CM

## 2021-04-24 DIAGNOSIS — Z794 Long term (current) use of insulin: Secondary | ICD-10-CM | POA: Diagnosis not present

## 2021-04-24 DIAGNOSIS — R1084 Generalized abdominal pain: Secondary | ICD-10-CM | POA: Diagnosis not present

## 2021-04-24 DIAGNOSIS — M545 Low back pain, unspecified: Secondary | ICD-10-CM | POA: Insufficient documentation

## 2021-04-24 DIAGNOSIS — R1012 Left upper quadrant pain: Secondary | ICD-10-CM | POA: Insufficient documentation

## 2021-04-24 DIAGNOSIS — E876 Hypokalemia: Secondary | ICD-10-CM

## 2021-04-24 DIAGNOSIS — R1032 Left lower quadrant pain: Secondary | ICD-10-CM | POA: Diagnosis present

## 2021-04-24 DIAGNOSIS — Z8522 Personal history of malignant neoplasm of nasal cavities, middle ear, and accessory sinuses: Secondary | ICD-10-CM | POA: Insufficient documentation

## 2021-04-24 DIAGNOSIS — R112 Nausea with vomiting, unspecified: Secondary | ICD-10-CM | POA: Insufficient documentation

## 2021-04-24 LAB — CBC
HCT: 42.4 % (ref 36.0–46.0)
Hemoglobin: 14.3 g/dL (ref 12.0–15.0)
MCH: 27.6 pg (ref 26.0–34.0)
MCHC: 33.7 g/dL (ref 30.0–36.0)
MCV: 81.9 fL (ref 80.0–100.0)
Platelets: 289 10*3/uL (ref 150–400)
RBC: 5.18 MIL/uL — ABNORMAL HIGH (ref 3.87–5.11)
RDW: 13.1 % (ref 11.5–15.5)
WBC: 6 10*3/uL (ref 4.0–10.5)
nRBC: 0 % (ref 0.0–0.2)

## 2021-04-24 LAB — COMPREHENSIVE METABOLIC PANEL
ALT: 10 U/L (ref 0–44)
AST: 12 U/L — ABNORMAL LOW (ref 15–41)
Albumin: 3.9 g/dL (ref 3.5–5.0)
Alkaline Phosphatase: 84 U/L (ref 38–126)
Anion gap: 12 (ref 5–15)
BUN: 8 mg/dL (ref 6–20)
CO2: 28 mmol/L (ref 22–32)
Calcium: 9.1 mg/dL (ref 8.9–10.3)
Chloride: 94 mmol/L — ABNORMAL LOW (ref 98–111)
Creatinine, Ser: 0.44 mg/dL (ref 0.44–1.00)
GFR, Estimated: >60 mL/min (ref >60)
Glucose, Bld: 352 mg/dL — ABNORMAL HIGH (ref 70–99)
Potassium: 3 mmol/L — ABNORMAL LOW (ref 3.5–5.1)
Sodium: 134 mmol/L — ABNORMAL LOW (ref 135–145)
Total Bilirubin: 0.8 mg/dL (ref 0.3–1.2)
Total Protein: 7.8 g/dL (ref 6.5–8.1)

## 2021-04-24 LAB — MAGNESIUM: Magnesium: 1.6 mg/dL — ABNORMAL LOW (ref 1.7–2.4)

## 2021-04-24 LAB — LIPASE, BLOOD: Lipase: 24 U/L (ref 11–51)

## 2021-04-24 LAB — I-STAT BETA HCG BLOOD, ED (MC, WL, AP ONLY): I-stat hCG, quantitative: <5 m[IU]/mL (ref <5)

## 2021-04-24 MED ORDER — POTASSIUM CHLORIDE CRYS ER 20 MEQ PO TBCR
40.0000 meq | EXTENDED_RELEASE_TABLET | Freq: Once | ORAL | Status: AC
  Administered 2021-04-25: 40 meq via ORAL
  Filled 2021-04-24: qty 2

## 2021-04-24 MED ORDER — METOCLOPRAMIDE HCL 5 MG/ML IJ SOLN
10.0000 mg | Freq: Once | INTRAMUSCULAR | Status: AC
  Administered 2021-04-24: 10 mg via INTRAVENOUS
  Filled 2021-04-24: qty 2

## 2021-04-24 MED ORDER — MAGNESIUM OXIDE -MG SUPPLEMENT 400 (240 MG) MG PO TABS: 400.0000 mg | ORAL_TABLET | Freq: Once | ORAL | Status: DC

## 2021-04-24 MED ORDER — HYDROMORPHONE HCL 1 MG/ML IJ SOLN
1.0000 mg | Freq: Once | INTRAMUSCULAR | Status: AC
  Administered 2021-04-24: 1 mg via INTRAVENOUS
  Filled 2021-04-24: qty 1

## 2021-04-24 MED ORDER — LACTATED RINGERS IV BOLUS
1000.0000 mL | Freq: Once | INTRAVENOUS | Status: AC
  Administered 2021-04-24: 1000 mL via INTRAVENOUS

## 2021-04-24 NOTE — ED Provider Notes (Signed)
[facility] DEPT Provider Note   CSN: [id number] Arrival date & time: 04/24/21  2027     History Chief Complaint  Patient presents with   Abdominal Pain    Jodi Mcgee is a 35 y.o. female.  HPI 35 year old female presents with abdominal pain and vomiting.  This has been severe for the last few days.  She has been having issues with her abdomen for a few weeks and she states may be for several months.  The pain right now is left low back and left lower abdomen.  She has been vomiting nonbloody emesis.  No diarrhea or bowel movements.  No dysuria or hematuria.  She has not had a fever.  The pain is currently sharp and severe.  She states this feels like prior diverticulitis.  Past Medical History:  Diagnosis Date   Anemia    Anxiety    Blood transfusion without reported diagnosis    both CS   Cocaine abuse ([place])    Diabetes mellitus without complication ([place])    GERD (gastroesophageal reflux disease)    has resolved   IBS (irritable bowel syndrome)    Ovarian cyst    Peritonitis, acute generalized ([place])    Pulmonary embolism (HCC)    SBO (small bowel obstruction) ([place])    Sepsis ([place])     Patient Active Problem List   Diagnosis Date Noted   Hypokalemia 03/22/2021   GERD (gastroesophageal reflux disease) 03/22/2021   Pyelonephritis 03/21/2021   Controlled type 2 diabetes mellitus with hyperglycemia ([place]) 05/10/2020   Partial small bowel obstruction ([place]) 05/09/2020   Bartholin's gland abscess 02/21/2019   DKA (diabetic ketoacidoses) 02/20/2019   Blood per rectum 02/20/2019   Hyponatremia 09/26/2018   Chronic hypertension 01/03/2018   IUD contraception 01/03/2018   Pulmonary embolism during puerperium 12/04/2017   Pulmonary edema 11/30/2017   Pelvic adhesive disease 10/22/2017   Nasal septal perforation 10/17/2017   History of cocaine abuse ([place]) 10/17/2017   Epistaxis 10/17/2017   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    Obesity (BMI 30-39.9) 09/25/2017   Type 2 diabetes mellitus, without long-term current use of insulin ([place]) 08/23/2017   Chronic pain syndrome 08/23/2017    Past Surgical History:  Procedure Laterality Date   APPENDECTOMY     ruptured    BUNIONECTOMY     CESAREAN SECTION     CESAREAN SECTION Bilateral 11/20/2017   Procedure: CESAREAN SECTION;  Surgeon: Sloan Leiter, MD;  Location: [facility];  Service: Obstetrics;  Laterality: Bilateral;   OVARIAN CYST DRAINAGE     SMALL BOWEL REPAIR       OB History     Gravida  3   Para  3   Term  1   Preterm  2   AB  0   Living  3      SAB  0   IAB  0   Ectopic  0   Multiple  0   Live Births  3           Family History  Problem Relation Age of Onset   Hypertension Mother    Anemia Mother    Diabetes Father     Social History   Tobacco Use   Smoking status: Light Smoker    Packs/day: 0.50    Pack years: 0.00    Types: Cigarettes   Smokeless tobacco: Never   Tobacco comments:    social use with tobacco  Vaping Use  Vaping Use: Some days  Substance Use Topics   Alcohol use: Yes    Comment: 3 times a month   Drug use: Not Currently    Home Medications Prior to Admission medications   Medication Sig Start Date End Date Taking? Authorizing Provider  blood glucose meter kit and supplies KIT Dispense based on patient and insurance preference. Use up to four times daily as directed. (FOR ICD-9 250.00, 250.01). 02/21/19   Oretha Milch D, MD  cephALEXin (KEFLEX) 500 MG capsule Take 1 capsule (500 mg total) by mouth 4 (four) times daily. 04/01/21   Hayden Rasmussen, MD  HUMULIN R 100 UNIT/ML injection Inject 20 Units into the skin 3 (three) times daily before meals. Per sliding scale 03/24/20   [provider]  hyoscyamine (LEVSIN SL) 0.125 MG SL tablet Place 1 tablet (0.125 mg total) under the tongue every 4 (four) hours as needed for cramping (abdominal pain). 04/15/21   Orpah Greek,  MD  insulin detemir (LEVEMIR) 100 unit/ml SOLN Inject 20 Units into the skin 3 (three) times daily. 03/02/21   Matilde Haymaker, MD  Levonorgestrel (LILETTA) 19.5 MCG/DAY IUD IUD 1 each by Intrauterine route continuous. Placed in 2019    [provider]  loperamide (IMODIUM) 2 MG capsule Take 1 capsule (2 mg total) by mouth 4 (four) times daily as needed for diarrhea or loose stools. 04/08/21   Tedd Sias, PA  methocarbamol (ROBAXIN) 500 MG tablet Take 1 tablet (500 mg total) by mouth 2 (two) times daily. 03/27/21   Lacretia Leigh, MD  ondansetron (ZOFRAN ODT) 4 MG disintegrating tablet Take 1 tablet (4 mg total) by mouth every 8 (eight) hours as needed for nausea or vomiting. 04/08/21   Tedd Sias, PA  oxyCODONE-acetaminophen (PERCOCET/ROXICET) 5-325 MG tablet Take 1 tablet by mouth every 6 (six) hours as needed for moderate pain or severe pain. 06/01/20   [provider]  polyethylene glycol (MIRALAX / GLYCOLAX) 17 g packet Take 17 g by mouth daily. 03/26/21   Arrien, Jimmy Picket, MD  potassium chloride (KLOR-CON) 10 MEQ tablet Take 1 tablet (10 mEq total) by mouth daily. 04/08/21   Tedd Sias, PA  promethazine (PHENERGAN) 25 MG tablet Take 1 tablet (25 mg total) by mouth every 6 (six) hours as needed for nausea or vomiting. 04/15/21   Pollina, Gwenyth Allegra, MD    Allergies    Hydrocodone, Iodinated diagnostic agents, Cetirizine & related, Toradol [ketorolac tromethamine], Flagyl [metronidazole], and Nsaids  Review of Systems   Review of Systems  Constitutional:  Negative for fever.  Gastrointestinal:  Positive for abdominal pain, nausea and vomiting.  Genitourinary:  Negative for dysuria and hematuria.  Musculoskeletal:  Positive for back pain.  All other systems reviewed and are negative.  Physical Exam Updated Vital Signs BP (!) 148/125   Pulse (!) 110   Temp 98.9 F (37.2 C) (Oral)   Resp 18   SpO2 100%   Physical Exam Vitals and nursing note  reviewed.  Constitutional:      Appearance: She is well-developed.  HENT:     Head: Normocephalic and atraumatic.     Right Ear: External ear normal.     Left Ear: External ear normal.     Nose: Nose normal.  Eyes:     General:        Right eye: No discharge.        Left eye: No discharge.  Cardiovascular:     Rate and Rhythm:  Normal rate and regular rhythm.     Heart sounds: Normal heart sounds.  Pulmonary:     Effort: Pulmonary effort is normal.     Breath sounds: Normal breath sounds.  Abdominal:     Palpations: Abdomen is soft.     Tenderness: There is abdominal tenderness (mild, hard to localize and reproduce when distracted) in the left upper quadrant and left lower quadrant.  Skin:    General: Skin is warm and dry.  Neurological:     Mental Status: She is alert.  Psychiatric:        Mood and Affect: Mood is not anxious.    ED Results / Procedures / Treatments   Labs (all labs ordered are listed, but only abnormal results are displayed) Labs Reviewed  COMPREHENSIVE METABOLIC PANEL - Abnormal; Notable for the following components:      Result Value   Sodium 134 (*)    Potassium 3.0 (*)    Chloride 94 (*)    Glucose, Bld 352 (*)    AST 12 (*)    All other components within normal limits  CBC - Abnormal; Notable for the following components:   RBC 5.18 (*)    All other components within normal limits  MAGNESIUM - Abnormal; Notable for the following components:   Magnesium 1.6 (*)    All other components within normal limits  LIPASE, BLOOD  URINALYSIS, ROUTINE W REFLEX MICROSCOPIC  I-STAT BETA HCG BLOOD, ED ([facility], [facility], [facility] ONLY)    EKG None  Radiology DG ABD ACUTE 2+V W 1V CHEST  Result Date: 04/24/2021 CLINICAL DATA:  Left-sided abdominal pain for 3 days.  Vomiting. EXAM: DG ABDOMEN ACUTE WITH 1 VIEW CHEST COMPARISON:  04/15/2021 FINDINGS: Shallow inspiration. Normal heart size and pulmonary vascularity. No focal airspace disease or consolidation in the lungs.  No blunting of costophrenic angles. No pneumothorax. Mediastinal contours appear intact. Scattered gas and stool throughout the colon. No small or large bowel distention. No free intra-abdominal air. No abnormal air-fluid levels. No radiopaque stones. Surgical clip in the left lower quadrant. Intrauterine device over the pelvis. Visualized bones and soft tissue contours appear intact. IMPRESSION: No evidence of active pulmonary disease. Normal nonobstructive bowel gas pattern. Electronically Signed   By: Lucienne Capers M.D.   On: 04/24/2021 22:47    Procedures Procedures   Medications Ordered in ED Medications  lactated ringers bolus 1,000 mL (1,000 mLs Intravenous New Bag/Given 04/24/21 2216)  HYDROmorphone (DILAUDID) injection 1 mg (1 mg Intravenous Given 04/24/21 2217)  metoCLOPramide (REGLAN) injection 10 mg (10 mg Intravenous Given 04/24/21 2217)    ED Course  I have reviewed the triage vital signs and the nursing notes.  Pertinent labs & imaging results that were available during my care of the patient were reviewed by me and considered in my medical decision making (see chart for details).    MDM Rules/Calculators/A&P                          Chart review shows that patient has had multiple CT scans in the past year or so.  None of these have seemed to clearly show diverticulitis so I think that is unlikely.  She is also had this left-sided abdominal pain on multiple evaluations including with a negative CT last month.  I do not think repeat imaging is warranted.  She does have a history of an SBO in the past so I will get an acute abdominal  series to screen.  Otherwise she will be given pain and nausea control and IV fluids.  Will need urinalysis given her history of kidney infections.  Will give p.o. challenge of potassium and magnesium.  She is feeling significantly better.  Care to Dr. Randal Buba. I suspect this is a flare of a chronic abdominal issue.  Final Clinical Impression(s) / ED  Diagnoses Final diagnoses:  None    Rx / DC Orders ED Discharge Orders     None        Sherwood Gambler, MD 04/24/21 2311

## 2021-04-24 NOTE — ED Triage Notes (Signed)
Pt reports L side abdominal pain for the last 3 days. States that it feels like one of her diverticulitis flares. Endorses vomiting, but denies diarrhea or bloody stools. A&Ox4. She is tearful in triage.

## 2021-04-25 ENCOUNTER — Encounter (HOSPITAL_COMMUNITY): Payer: Self-pay

## 2021-04-25 ENCOUNTER — Other Ambulatory Visit: Payer: Self-pay

## 2021-04-25 ENCOUNTER — Emergency Department (HOSPITAL_COMMUNITY)
Admission: EM | Admit: 2021-04-25 | Discharge: 2021-04-25 | Disposition: A | Discharge status: Home / Self Care | Payer: Medicaid Other | Source: Home / Self Care | Attending: Emergency Medicine | Admitting: Emergency Medicine

## 2021-04-25 DIAGNOSIS — F17210 Nicotine dependence, cigarettes, uncomplicated: Secondary | ICD-10-CM | POA: Insufficient documentation

## 2021-04-25 DIAGNOSIS — M545 Low back pain, unspecified: Secondary | ICD-10-CM | POA: Insufficient documentation

## 2021-04-25 DIAGNOSIS — R112 Nausea with vomiting, unspecified: Secondary | ICD-10-CM | POA: Insufficient documentation

## 2021-04-25 DIAGNOSIS — Z794 Long term (current) use of insulin: Secondary | ICD-10-CM | POA: Insufficient documentation

## 2021-04-25 DIAGNOSIS — E1169 Type 2 diabetes mellitus with other specified complication: Secondary | ICD-10-CM | POA: Insufficient documentation

## 2021-04-25 DIAGNOSIS — R1084 Generalized abdominal pain: Secondary | ICD-10-CM

## 2021-04-25 DIAGNOSIS — Z8522 Personal history of malignant neoplasm of nasal cavities, middle ear, and accessory sinuses: Secondary | ICD-10-CM | POA: Insufficient documentation

## 2021-04-25 DIAGNOSIS — K219 Gastro-esophageal reflux disease without esophagitis: Secondary | ICD-10-CM | POA: Insufficient documentation

## 2021-04-25 DIAGNOSIS — E669 Obesity, unspecified: Secondary | ICD-10-CM | POA: Insufficient documentation

## 2021-04-25 LAB — CBC WITH DIFFERENTIAL/PLATELET
Abs Immature Granulocytes: 0.01 10*3/uL (ref 0.00–0.07)
Basophils Absolute: 0 10*3/uL (ref 0.0–0.1)
Basophils Relative: 1 %
Eosinophils Absolute: 0 10*3/uL (ref 0.0–0.5)
Eosinophils Relative: 1 %
HCT: 46.9 % — ABNORMAL HIGH (ref 36.0–46.0)
Hemoglobin: 15.6 g/dL — ABNORMAL HIGH (ref 12.0–15.0)
Immature Granulocytes: 0 %
Lymphocytes Relative: 17 %
Lymphs Abs: 1 10*3/uL (ref 0.7–4.0)
MCH: 27.3 pg (ref 26.0–34.0)
MCHC: 33.3 g/dL (ref 30.0–36.0)
MCV: 82 fL (ref 80.0–100.0)
Monocytes Absolute: 0.2 10*3/uL (ref 0.1–1.0)
Monocytes Relative: 4 %
Neutro Abs: 4.3 10*3/uL (ref 1.7–7.7)
Neutrophils Relative %: 77 %
Platelets: 289 10*3/uL (ref 150–400)
RBC: 5.72 MIL/uL — ABNORMAL HIGH (ref 3.87–5.11)
RDW: 12.9 % (ref 11.5–15.5)
WBC: 5.6 10*3/uL (ref 4.0–10.5)
nRBC: 0 % (ref 0.0–0.2)

## 2021-04-25 LAB — URINALYSIS, ROUTINE W REFLEX MICROSCOPIC
Bacteria, UA: NONE SEEN
Bilirubin Urine: NEGATIVE
Bilirubin Urine: NEGATIVE
Glucose, UA: 150 mg/dL — AB
Glucose, UA: >=500 mg/dL — AB
Hgb urine dipstick: NEGATIVE
Hgb urine dipstick: NEGATIVE
Ketones, ur: 5 mg/dL — AB
Ketones, ur: 20 mg/dL — AB
Nitrite: NEGATIVE
Nitrite: NEGATIVE
Protein, ur: 30 mg/dL — AB
Protein, ur: 100 mg/dL — AB
Specific Gravity, Urine: 1.019 (ref 1.005–1.030)
Specific Gravity, Urine: 1.015 (ref 1.005–1.030)
WBC, UA: >50 WBC/hpf — ABNORMAL HIGH (ref 0–5)
WBC, UA: >50 WBC/hpf — ABNORMAL HIGH (ref 0–5)
pH: 6 (ref 5.0–8.0)
pH: 8 (ref 5.0–8.0)

## 2021-04-25 LAB — RAPID URINE DRUG SCREEN, HOSP PERFORMED
Amphetamines: NOT DETECTED
Barbiturates: NOT DETECTED
Benzodiazepines: NOT DETECTED
Cocaine: POSITIVE — AB
Opiates: NOT DETECTED
Tetrahydrocannabinol: NOT DETECTED

## 2021-04-25 LAB — COMPREHENSIVE METABOLIC PANEL
ALT: 12 U/L (ref 0–44)
AST: 19 U/L (ref 15–41)
Albumin: 4.1 g/dL (ref 3.5–5.0)
Alkaline Phosphatase: 85 U/L (ref 38–126)
Anion gap: 13 (ref 5–15)
BUN: 9 mg/dL (ref 6–20)
CO2: 26 mmol/L (ref 22–32)
Calcium: 9.4 mg/dL (ref 8.9–10.3)
Chloride: 96 mmol/L — ABNORMAL LOW (ref 98–111)
Creatinine, Ser: 0.44 mg/dL (ref 0.44–1.00)
GFR, Estimated: >60 mL/min (ref >60)
Glucose, Bld: 335 mg/dL — ABNORMAL HIGH (ref 70–99)
Potassium: 3.8 mmol/L (ref 3.5–5.1)
Sodium: 135 mmol/L (ref 135–145)
Total Bilirubin: 0.9 mg/dL (ref 0.3–1.2)
Total Protein: 8.3 g/dL — ABNORMAL HIGH (ref 6.5–8.1)

## 2021-04-25 LAB — CBG MONITORING, ED: Glucose-Capillary: 346 mg/dL — ABNORMAL HIGH (ref 70–99)

## 2021-04-25 LAB — I-STAT BETA HCG BLOOD, ED (MC, WL, AP ONLY): I-stat hCG, quantitative: <5 m[IU]/mL (ref <5)

## 2021-04-25 LAB — LIPASE, BLOOD: Lipase: 23 U/L (ref 11–51)

## 2021-04-25 MED ORDER — CEPHALEXIN 500 MG PO CAPS
500.0000 mg | ORAL_CAPSULE | Freq: Four times a day (QID) | ORAL | 0 refills | Status: DC
Start: 2021-04-25 — End: 2021-08-13

## 2021-04-25 MED ORDER — PROMETHAZINE HCL 25 MG RE SUPP: 25.0000 mg | Freq: Four times a day (QID) | RECTAL | 0 refills | Status: DC | PRN

## 2021-04-25 MED ORDER — CEFTRIAXONE SODIUM 1 G IJ SOLR
1.0000 g | Freq: Once | INTRAMUSCULAR | Status: AC
  Administered 2021-04-25: 1 g via INTRAMUSCULAR
  Filled 2021-04-25: qty 10

## 2021-04-25 MED ORDER — ACETAMINOPHEN 500 MG PO TABS
1000.0000 mg | ORAL_TABLET | Freq: Once | ORAL | Status: AC
  Administered 2021-04-25: 1000 mg via ORAL
  Filled 2021-04-25: qty 2

## 2021-04-25 MED ORDER — DROPERIDOL 2.5 MG/ML IJ SOLN
1.2500 mg | Freq: Once | INTRAMUSCULAR | Status: AC
  Administered 2021-04-25: 1.25 mg via INTRAVENOUS
  Filled 2021-04-25: qty 2

## 2021-04-25 MED ORDER — ONDANSETRON 8 MG PO TBDP: ORAL_TABLET | ORAL | 0 refills | Status: DC

## 2021-04-25 MED ORDER — ONDANSETRON 4 MG PO TBDP
4.0000 mg | ORAL_TABLET | Freq: Once | ORAL | Status: AC
  Administered 2021-04-25: 4 mg via ORAL
  Filled 2021-04-25: qty 1

## 2021-04-25 MED ORDER — ONDANSETRON 4 MG PO TBDP: 4.0000 mg | ORAL_TABLET | Freq: Three times a day (TID) | ORAL | 0 refills | Status: DC | PRN

## 2021-04-25 MED ORDER — SODIUM CHLORIDE 0.9 % IV BOLUS
1000.0000 mL | Freq: Once | INTRAVENOUS | Status: AC
  Administered 2021-04-25: 1000 mL via INTRAVENOUS

## 2021-04-25 MED ORDER — LIDOCAINE HCL 1 % IJ SOLN
INTRAMUSCULAR | Status: AC
  Filled 2021-04-25: qty 20

## 2021-04-25 MED ORDER — DROPERIDOL 2.5 MG/ML IJ SOLN: 2.5000 mg | Freq: Once | INTRAMUSCULAR | Status: DC

## 2021-04-25 MED ORDER — MAGNESIUM SULFATE 2 GM/50ML IV SOLN
2.0000 g | Freq: Once | INTRAVENOUS | Status: AC
  Administered 2021-04-25: 2 g via INTRAVENOUS
  Filled 2021-04-25: qty 50

## 2021-04-25 MED ORDER — POTASSIUM CHLORIDE ER 10 MEQ PO TBCR: 10.0000 meq | EXTENDED_RELEASE_TABLET | Freq: Two times a day (BID) | ORAL | 0 refills | Status: DC

## 2021-04-25 MED ORDER — ONDANSETRON 8 MG PO TBDP
8.0000 mg | ORAL_TABLET | Freq: Once | ORAL | Status: AC
  Administered 2021-04-25: 8 mg via ORAL
  Filled 2021-04-25: qty 1

## 2021-04-25 NOTE — ED Provider Notes (Signed)
Emergency Medicine Provider Triage Evaluation Note  Jodi Mcgee , a 35 y.o. female  was evaluated in triage.  Pt complains of back pain.  Was seen and evaluated yesterday diagnosed with a UTI and was given antibiotics.  Woke up this morning with worsening abdominal pain and back pain.  Concern for her kidneys.  Review of Systems  Positive: Abdominal pain, back pain Negative: Fever  Physical Exam  BP (!) 171/115 (BP Location: Right Arm)   Pulse 99   Temp 98.9 F (37.2 C) (Oral)   Resp 18   Ht 5\' 7"  (1.702 m)   Wt 63.5 kg   SpO2 100%   BMI 21.93 kg/m  Gen:   Awake, no distress   Resp:  Normal effort  MSK:   Moves extremities without difficulty  Other:  Lower lumbar tenderness without midline tenderness  Medical Decision Making  Medically screening exam initiated at 4:12 PM.  Appropriate orders placed.  Jodi Mcgee was informed that the remainder of the evaluation will be completed by another provider, this initial triage assessment does not replace that evaluation, and the importance of remaining in the ED until their evaluation is complete.  Lab work ordered   Delia Heady, PA-C 04/25/21 1613    Hayden Rasmussen, MD 04/26/21 1146

## 2021-04-25 NOTE — Discharge Instructions (Addendum)
Please pick up medications and take as prescribed. I would recommend using the Zofran first and if you continue to have nausea/vomiting to use the suppository.   Please follow up with your PCP regarding your ED visit today  Return to the ED for any new/worsening symptoms

## 2021-04-25 NOTE — ED Provider Notes (Signed)
[facility] DEPT Provider Note   CSN: [id number] Arrival date & time: 04/25/21  1514     History Chief Complaint  Patient presents with   Abdominal Pain    Jodi Mcgee is a 35 y.o. female with PMHx Diabetes, GERD, SBO who presents to the ED today with complaint of sudden onset, constant, worsening, diffuse lower back pain and abdominal pain that began earlier this morning. She was seen in the ED yesterday for same and diagnosed with a UTI. She was treated in the ED with IV reglan and dilaudid and states she felt well on her way home. This morning she woke up in severe pain prompting return to ED. She states she has picked up her antibiotic and is taking it as prescribed.   Per chart review this is pt's 12th ED visit in 6 months 10/31/20 for left flank/LUQ pain with negative CT scan 11/04/20 for left flank/LUQ pain 11/15/20 for left flank/LUQ pain with negative CT scan 12/06/20 for left flank pain 12/15/20 for LUQ pain 03/09/21 for hyperglycemia and abdominal pain 03/20/21 for chest pain 03/21/21 for abdominal pain; diagnosed with pyelo and admitted to the hospital for intractable nausea and vomiting 03/27/21 for back pain 04/01/21 for bilateral flank pain with  negative CT scan 04/08/21 for abdominal pain with negative CT scan  The history is provided by the patient and medical records.      Past Medical History:  Diagnosis Date   Anemia    Anxiety    Blood transfusion without reported diagnosis    both CS   Cocaine abuse ([place])    Diabetes mellitus without complication ([place])    GERD (gastroesophageal reflux disease)    has resolved   IBS (irritable bowel syndrome)    Ovarian cyst    Peritonitis, acute generalized ([place])    Pulmonary embolism ([facility])    SBO (small bowel obstruction) ([place])    Sepsis ([place])     Patient Active Problem List   Diagnosis Date Noted   Hypokalemia 03/22/2021   GERD (gastroesophageal reflux disease) 03/22/2021    Pyelonephritis 03/21/2021   Controlled type 2 diabetes mellitus with hyperglycemia ([place]) 05/10/2020   Partial small bowel obstruction ([place]) 05/09/2020   Bartholin's gland abscess 02/21/2019   DKA (diabetic ketoacidoses) 02/20/2019   Blood per rectum 02/20/2019   Hyponatremia 09/26/2018   Chronic hypertension 01/03/2018   IUD contraception 01/03/2018   Pulmonary embolism during puerperium 12/04/2017   Pulmonary edema 11/30/2017   Pelvic adhesive disease 10/22/2017   Nasal septal perforation 10/17/2017   History of cocaine abuse ([place]) 10/17/2017   Epistaxis 10/17/2017   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   Obesity (BMI 30-39.9) 09/25/2017   Type 2 diabetes mellitus, without long-term current use of insulin ([place]) 08/23/2017   Chronic pain syndrome 08/23/2017    Past Surgical History:  Procedure Laterality Date   APPENDECTOMY     ruptured    BUNIONECTOMY     CESAREAN SECTION     CESAREAN SECTION Bilateral 11/20/2017   Procedure: CESAREAN SECTION;  Surgeon: Sloan Leiter, MD;  Location: [facility];  Service: Obstetrics;  Laterality: Bilateral;   OVARIAN CYST DRAINAGE     SMALL BOWEL REPAIR       OB History     Gravida  3   Para  3   Term  1   Preterm  2   AB  0   Living  3      SAB  0   IAB  0   Ectopic  0   Multiple  0   Live Births  3           Family History  Problem Relation Age of Onset   Hypertension Mother    Anemia Mother    Diabetes Father     Social History   Tobacco Use   Smoking status: Light Smoker    Packs/day: 0.50    Pack years: 0.00    Types: Cigarettes   Smokeless tobacco: Never   Tobacco comments:    social use with tobacco  Vaping Use   Vaping Use: Some days  Substance Use Topics   Alcohol use: Yes    Comment: 3 times a month   Drug use: Not Currently    Home Medications Prior to Admission medications   Medication Sig Start Date End Date Taking? Authorizing Provider  ondansetron (ZOFRAN ODT) 4  MG disintegrating tablet Take 1 tablet (4 mg total) by mouth every 8 (eight) hours as needed for nausea or vomiting. 04/25/21  Yes Abron Neddo, PA-C  promethazine (PHENERGAN) 25 MG suppository Place 1 suppository (25 mg total) rectally every 6 (six) hours as needed for nausea or vomiting. 04/25/21  Yes Trachelle Low, PA-C  blood glucose meter kit and supplies KIT Dispense based on patient and insurance preference. Use up to four times daily as directed. (FOR ICD-9 250.00, 250.01). 02/21/19   Oretha Milch D, MD  cephALEXin (KEFLEX) 500 MG capsule Take 1 capsule (500 mg total) by mouth 4 (four) times daily. 04/01/21   Hayden Rasmussen, MD  cephALEXin (KEFLEX) 500 MG capsule Take 1 capsule (500 mg total) by mouth 4 (four) times daily. 04/25/21   Palumbo, April, MD  HUMULIN R 100 UNIT/ML injection Inject 20 Units into the skin 3 (three) times daily before meals. Per sliding scale 03/24/20   [provider]  hyoscyamine (LEVSIN SL) 0.125 MG SL tablet Place 1 tablet (0.125 mg total) under the tongue every 4 (four) hours as needed for cramping (abdominal pain). 04/15/21   Orpah Greek, MD  insulin detemir (LEVEMIR) 100 unit/ml SOLN Inject 20 Units into the skin 3 (three) times daily. 03/02/21   Matilde Haymaker, MD  Levonorgestrel (LILETTA) 19.5 MCG/DAY IUD IUD 1 each by Intrauterine route continuous. Placed in 2019    [provider]  loperamide (IMODIUM) 2 MG capsule Take 1 capsule (2 mg total) by mouth 4 (four) times daily as needed for diarrhea or loose stools. 04/08/21   Tedd Sias, PA  methocarbamol (ROBAXIN) 500 MG tablet Take 1 tablet (500 mg total) by mouth 2 (two) times daily. 03/27/21   Lacretia Leigh, MD  ondansetron (ZOFRAN ODT) 4 MG disintegrating tablet Take 1 tablet (4 mg total) by mouth every 8 (eight) hours as needed for nausea or vomiting. 04/08/21   Tedd Sias, PA  ondansetron (ZOFRAN ODT) 8 MG disintegrating tablet 17m ODT q8 hours prn nausea 04/25/21   Palumbo,  April, MD  oxyCODONE-acetaminophen (PERCOCET/ROXICET) 5-325 MG tablet Take 1 tablet by mouth every 6 (six) hours as needed for moderate pain or severe pain. 06/01/20   [provider]  polyethylene glycol (MIRALAX / GLYCOLAX) 17 g packet Take 17 g by mouth daily. 03/26/21   Arrien, MJimmy Picket MD  potassium chloride (KLOR-CON) 10 MEQ tablet Take 1 tablet (10 mEq total) by mouth daily. 04/08/21   FTedd Sias PA  potassium chloride (KLOR-CON) 10 MEQ tablet Take 1 tablet (10  mEq total) by mouth 2 (two) times daily. 04/25/21   Palumbo, April, MD  promethazine (PHENERGAN) 25 MG tablet Take 1 tablet (25 mg total) by mouth every 6 (six) hours as needed for nausea or vomiting. 04/15/21   Pollina, Gwenyth Allegra, MD    Allergies    Hydrocodone, Iodinated diagnostic agents, Cetirizine & related, Toradol [ketorolac tromethamine], Flagyl [metronidazole], and Nsaids  Review of Systems   Review of Systems  Constitutional:  Negative for chills and fever.  Gastrointestinal:  Positive for abdominal pain, nausea and vomiting. Negative for constipation and diarrhea.  Genitourinary:  Positive for flank pain.  All other systems reviewed and are negative.  Physical Exam Updated Vital Signs BP (!) 167/115   Pulse (!) 107   Temp 98.9 F (37.2 C) (Oral)   Resp 17   Ht '5\' 7"'  (1.702 m)   Wt 63.5 kg   SpO2 100%   BMI 21.93 kg/m   Physical Exam Vitals and nursing note reviewed.  Constitutional:      Appearance: She is not ill-appearing or diaphoretic.  HENT:     Head: Normocephalic and atraumatic.  Eyes:     Conjunctiva/sclera: Conjunctivae normal.  Cardiovascular:     Rate and Rhythm: Normal rate and regular rhythm.     Heart sounds: Normal heart sounds.  Pulmonary:     Effort: Pulmonary effort is normal.     Breath sounds: Normal breath sounds. No wheezing, rhonchi or rales.  Abdominal:     Palpations: Abdomen is soft.     Tenderness: There is abdominal tenderness in the suprapubic  area. There is right CVA tenderness and left CVA tenderness. There is no guarding or rebound.  Musculoskeletal:     Cervical back: Neck supple.  Skin:    General: Skin is warm and dry.  Neurological:     Mental Status: She is alert.    ED Results / Procedures / Treatments   Labs (all labs ordered are listed, but only abnormal results are displayed) Labs Reviewed  COMPREHENSIVE METABOLIC PANEL - Abnormal; Notable for the following components:      Result Value   Chloride 96 (*)    Glucose, Bld 335 (*)    Total Protein 8.3 (*)    All other components within normal limits  CBC WITH DIFFERENTIAL/PLATELET - Abnormal; Notable for the following components:   RBC 5.72 (*)    Hemoglobin 15.6 (*)    HCT 46.9 (*)    All other components within normal limits  URINALYSIS, ROUTINE W REFLEX MICROSCOPIC - Abnormal; Notable for the following components:   APPearance CLOUDY (*)    Glucose, UA >=500 (*)    Ketones, ur 20 (*)    Protein, ur 100 (*)    Leukocytes,Ua MODERATE (*)    WBC, UA >50 (*)    All other components within normal limits  RAPID URINE DRUG SCREEN, HOSP PERFORMED - Abnormal; Notable for the following components:   Cocaine POSITIVE (*)    All other components within normal limits  CBG MONITORING, ED - Abnormal; Notable for the following components:   Glucose-Capillary 346 (*)    All other components within normal limits  LIPASE, BLOOD  I-STAT BETA HCG BLOOD, ED ([facility], [facility], [facility] ONLY)    EKG EKG Interpretation  Date/Time:  Monday April 25 2021 18:28:06 EDT Ventricular Rate:  97 PR Interval:  144 QRS Duration: 90 QT Interval:  356 QTC Calculation: 452 R Axis:   69 Text Interpretation: Normal sinus rhythm Cannot  rule out Anterior infarct , age undetermined Abnormal ECG NSR Confirmed by Lavenia Atlas [phone]) on 04/25/2021 7:46:48 PM  Radiology DG ABD ACUTE 2+V W 1V CHEST  Result Date: 04/24/2021 CLINICAL DATA:  Left-sided abdominal pain for 3 days.  Vomiting. EXAM: DG  ABDOMEN ACUTE WITH 1 VIEW CHEST COMPARISON:  04/15/2021 FINDINGS: Shallow inspiration. Normal heart size and pulmonary vascularity. No focal airspace disease or consolidation in the lungs. No blunting of costophrenic angles. No pneumothorax. Mediastinal contours appear intact. Scattered gas and stool throughout the colon. No small or large bowel distention. No free intra-abdominal air. No abnormal air-fluid levels. No radiopaque stones. Surgical clip in the left lower quadrant. Intrauterine device over the pelvis. Visualized bones and soft tissue contours appear intact. IMPRESSION: No evidence of active pulmonary disease. Normal nonobstructive bowel gas pattern. Electronically Signed   By: Lucienne Capers M.D.   On: 04/24/2021 22:47    Procedures Procedures   Medications Ordered in ED Medications  ondansetron (ZOFRAN-ODT) disintegrating tablet 4 mg (4 mg Oral Given 04/25/21 1634)  sodium chloride 0.9 % bolus 1,000 mL (0 mLs Intravenous Stopped 04/25/21 1900)  droperidol (INAPSINE) 2.5 MG/ML injection 1.25 mg (1.25 mg Intravenous Given 04/25/21 1917)  ondansetron (ZOFRAN-ODT) disintegrating tablet 4 mg (4 mg Oral Given 04/25/21 2146)    ED Course  I have reviewed the triage vital signs and the nursing notes.  Pertinent labs & imaging results that were available during my care of the patient were reviewed by me and considered in my medical decision making (see chart for details).    MDM Rules/Calculators/A&P                          35 year old female who presents to the ED today with complaint of worsening back pain and abdominal pain that began this morning.  Seen in the ED yesterday for similar symptoms, diagnosed with UTI and discharged home.  Has been taking oral antibiotics since last night.  On arrival to the ED today patient is afebrile, nontachycardic and nontachypneic.  She is writhing around in bed and appears uncomfortable.  Blood pressure elevated at 171/115; likely s/2 pain. Does not  appear to be on antihypertensives.  He was provided with Zofran prior to being seen however had another episode of emesis.  On my exam she has bilateral CVA tenderness palpation as well as suprapubic abdominal tenderness palpation.  Per chart review this is patient's 13th ED visit in 6 months, mostly for abdominal pain/flank pain.  She has had multiple unequivocal CT scans with most recent being on 06/17.  We will plan for EKG at this time to assess QTC, question of droperidol would be beneficial for patient.  We will plan for IV fluids, labs including CBC, CMP, lipase as well as repeat urinalysis and UDS to assess for any CHS causing symptoms.   CBC without leukocytosis. Hgb, HCT, and RBC count slightly elevated suggesting dehydration. Fluids provided.  CMP with glucose 335; bicarb WNL at 26; no gap. Not consistent with DKA. Appears pt is always running in the 300s.  Lipase 23 U/A with continued moderate leuks and < 50 WBCs. On abx for same.  Beta hcg negative UDS positive for cocaine  EKG obtained with slightly increased qtc 452; will provide half dose of droperidol at this time.   On reevaluation after droperidol and rest pt resting comfortably. Will plan to fluid challenge and discharge home however pt requesting additional nausea meds prior to  fluid challenge. Provided ODT zofran prior to fluid challenge.   No more emesis in the ED. Stable for discharge home.   This note was prepared using Dragon voice recognition software and may include unintentional dictation errors due to the inherent limitations of voice recognition software.   Final Clinical Impression(s) / ED Diagnoses Final diagnoses:  Generalized abdominal pain  Nausea and vomiting, intractability of vomiting not specified, unspecified vomiting type    Rx / DC Orders ED Discharge Orders          Ordered    ondansetron (ZOFRAN ODT) 4 MG disintegrating tablet  Every 8 hours PRN        04/25/21 2209    promethazine  (PHENERGAN) 25 MG suppository  Every 6 hours PRN        04/25/21 2209             Discharge Instructions      Please pick up medications and take as prescribed. I would recommend using the Zofran first and if you continue to have nausea/vomiting to use the suppository.   Please follow up with your PCP regarding your ED visit today  Return to the ED for any new/worsening symptoms       Eustaquio Maize, PA-C 04/25/21 2211    Horton, Alvin Critchley, DO 04/25/21 2234

## 2021-04-25 NOTE — ED Triage Notes (Signed)
Pt reports severe abdominal and back pain that began this morning. Pt states she was here last night and was diagnosed with a UTI and given pain and nausea medicine, and an antibiotic injection. Pt reports feeling  much worse this morning and is writhing in pain.

## 2021-06-21 ENCOUNTER — Other Ambulatory Visit: Payer: Self-pay

## 2021-06-21 ENCOUNTER — Encounter (HOSPITAL_BASED_OUTPATIENT_CLINIC_OR_DEPARTMENT_OTHER): Payer: Self-pay

## 2021-06-21 ENCOUNTER — Emergency Department (HOSPITAL_BASED_OUTPATIENT_CLINIC_OR_DEPARTMENT_OTHER)
Admission: EM | Admit: 2021-06-21 | Discharge: 2021-06-21 | Disposition: A | Discharge status: Home / Self Care | Payer: Medicaid Other | Attending: Emergency Medicine | Admitting: Emergency Medicine

## 2021-06-21 DIAGNOSIS — R109 Unspecified abdominal pain: Secondary | ICD-10-CM | POA: Diagnosis present

## 2021-06-21 DIAGNOSIS — E119 Type 2 diabetes mellitus without complications: Secondary | ICD-10-CM | POA: Insufficient documentation

## 2021-06-21 DIAGNOSIS — R112 Nausea with vomiting, unspecified: Secondary | ICD-10-CM | POA: Insufficient documentation

## 2021-06-21 DIAGNOSIS — Z79899 Other long term (current) drug therapy: Secondary | ICD-10-CM | POA: Diagnosis not present

## 2021-06-21 DIAGNOSIS — F17210 Nicotine dependence, cigarettes, uncomplicated: Secondary | ICD-10-CM | POA: Diagnosis not present

## 2021-06-21 DIAGNOSIS — I10 Essential (primary) hypertension: Secondary | ICD-10-CM | POA: Diagnosis not present

## 2021-06-21 DIAGNOSIS — R1030 Lower abdominal pain, unspecified: Secondary | ICD-10-CM | POA: Diagnosis not present

## 2021-06-21 DIAGNOSIS — Z85828 Personal history of other malignant neoplasm of skin: Secondary | ICD-10-CM | POA: Insufficient documentation

## 2021-06-21 DIAGNOSIS — Z794 Long term (current) use of insulin: Secondary | ICD-10-CM | POA: Diagnosis not present

## 2021-06-21 LAB — URINALYSIS, ROUTINE W REFLEX MICROSCOPIC
Bilirubin Urine: NEGATIVE
Glucose, UA: >=500 mg/dL — AB
Hgb urine dipstick: NEGATIVE
Ketones, ur: 15 mg/dL — AB
Leukocytes,Ua: NEGATIVE
Nitrite: NEGATIVE
Protein, ur: NEGATIVE mg/dL
Specific Gravity, Urine: 1.01 (ref 1.005–1.030)
pH: 6 (ref 5.0–8.0)

## 2021-06-21 LAB — CBC WITH DIFFERENTIAL/PLATELET
Abs Immature Granulocytes: 0.03 10*3/uL (ref 0.00–0.07)
Basophils Absolute: 0 10*3/uL (ref 0.0–0.1)
Basophils Relative: 1 %
Eosinophils Absolute: 0.1 10*3/uL (ref 0.0–0.5)
Eosinophils Relative: 2 %
HCT: 42 % (ref 36.0–46.0)
Hemoglobin: 13.9 g/dL (ref 12.0–15.0)
Immature Granulocytes: 1 %
Lymphocytes Relative: 23 %
Lymphs Abs: 1.5 10*3/uL (ref 0.7–4.0)
MCH: 27.3 pg (ref 26.0–34.0)
MCHC: 33.1 g/dL (ref 30.0–36.0)
MCV: 82.5 fL (ref 80.0–100.0)
Monocytes Absolute: 0.4 10*3/uL (ref 0.1–1.0)
Monocytes Relative: 7 %
Neutro Abs: 4.4 10*3/uL (ref 1.7–7.7)
Neutrophils Relative %: 66 %
Platelets: 314 10*3/uL (ref 150–400)
RBC: 5.09 MIL/uL (ref 3.87–5.11)
RDW: 13.6 % (ref 11.5–15.5)
WBC: 6.5 10*3/uL (ref 4.0–10.5)
nRBC: 0 % (ref 0.0–0.2)

## 2021-06-21 LAB — COMPREHENSIVE METABOLIC PANEL
ALT: 9 U/L (ref 0–44)
AST: 13 U/L — ABNORMAL LOW (ref 15–41)
Albumin: 4.1 g/dL (ref 3.5–5.0)
Alkaline Phosphatase: 77 U/L (ref 38–126)
Anion gap: 12 (ref 5–15)
BUN: 11 mg/dL (ref 6–20)
CO2: 28 mmol/L (ref 22–32)
Calcium: 9.5 mg/dL (ref 8.9–10.3)
Chloride: 92 mmol/L — ABNORMAL LOW (ref 98–111)
Creatinine, Ser: 0.58 mg/dL (ref 0.44–1.00)
GFR, Estimated: >60 mL/min (ref >60)
Glucose, Bld: 425 mg/dL — ABNORMAL HIGH (ref 70–99)
Potassium: 3.2 mmol/L — ABNORMAL LOW (ref 3.5–5.1)
Sodium: 132 mmol/L — ABNORMAL LOW (ref 135–145)
Total Bilirubin: 1.1 mg/dL (ref 0.3–1.2)
Total Protein: 8.1 g/dL (ref 6.5–8.1)

## 2021-06-21 LAB — URINALYSIS, MICROSCOPIC (REFLEX)

## 2021-06-21 LAB — RAPID URINE DRUG SCREEN, HOSP PERFORMED
Amphetamines: NOT DETECTED
Barbiturates: NOT DETECTED
Benzodiazepines: NOT DETECTED
Cocaine: POSITIVE — AB
Opiates: POSITIVE — AB
Tetrahydrocannabinol: NOT DETECTED

## 2021-06-21 LAB — LIPASE, BLOOD: Lipase: 26 U/L (ref 11–51)

## 2021-06-21 MED ORDER — METOCLOPRAMIDE HCL 10 MG PO TABS: 10.0000 mg | ORAL_TABLET | Freq: Four times a day (QID) | ORAL | 0 refills | Status: DC

## 2021-06-21 MED ORDER — ONDANSETRON HCL 4 MG/2ML IJ SOLN
4.0000 mg | Freq: Once | INTRAMUSCULAR | Status: AC
  Administered 2021-06-21: 4 mg via INTRAVENOUS
  Filled 2021-06-21: qty 2

## 2021-06-21 MED ORDER — SODIUM CHLORIDE 0.9 % IV BOLUS
1000.0000 mL | Freq: Once | INTRAVENOUS | Status: AC
  Administered 2021-06-21: 1000 mL via INTRAVENOUS

## 2021-06-21 MED ORDER — METOCLOPRAMIDE HCL 5 MG/ML IJ SOLN
10.0000 mg | Freq: Once | INTRAMUSCULAR | Status: AC
  Administered 2021-06-21: 10 mg via INTRAVENOUS
  Filled 2021-06-21: qty 2

## 2021-06-21 MED ORDER — MORPHINE SULFATE (PF) 4 MG/ML IV SOLN
4.0000 mg | Freq: Once | INTRAVENOUS | Status: AC
  Administered 2021-06-21: 4 mg via INTRAVENOUS
  Filled 2021-06-21: qty 1

## 2021-06-21 NOTE — ED Provider Notes (Signed)
Medical screening examination/treatment/procedure(s) were conducted as a shared visit with non-physician practitioner(s) and myself.  I personally evaluated the patient during the encounter.  Clinical Impression:   Final diagnoses:  Lower abdominal pain    Pt with chronic abd pain for a couple of months Has vomiting with this - neg w/u X many - UTI?  Has minimall tender abd - VS normal - no fever, Pt getting fluids and reglan - likely stable for d/c.     Noemi Chapel, MD 07/02/21 [id number]

## 2021-06-21 NOTE — ED Provider Notes (Signed)
[facility] Provider Note   CSN: [id number] Arrival date & time: 06/21/21  1105     History Chief Complaint  Patient presents with   Abdominal Pain    Jodi Mcgee is a 35 y.o. female with history of type 2 diabetes on insulin who presents with left-sided abdominal pain and left flank pain.  Patient was seen in ED on 8/26 for similar symptoms, she states that her pain went away initially upon returning home.  Pain then returned the following day with associated nausea and nonbloody, nonbilious emesis.  She describes the pain is constant, no aggravating or alleviating factors.  Patient states pain feels similar to the time she has had pyelonephritis prior.  Patient checks blood sugars at home, reports they have been in the 120s.  She denies fevers, chills, diarrhea, constipation, headache, chest pain, shortness of breath, dysuria, hematuria.  Patient reports smoking marijuana, denies other drug use.  Denies chance of pregnancy, patient has IUD in place.  Upon chart review, this is patient's 15th ED visit since January for similar symptoms.  Work-up on 8/26 included urinalysis and CT abdomen pelvis that were normal.     Abdominal Pain Associated symptoms: nausea and vomiting   Associated symptoms: no chest pain, no chills, no constipation, no diarrhea, no dysuria, no fever and no shortness of breath       Past Medical History:  Diagnosis Date   Anemia    Anxiety    Blood transfusion without reported diagnosis    both CS   Cocaine abuse ([place])    Diabetes mellitus without complication ([place])    GERD (gastroesophageal reflux disease)    has resolved   IBS (irritable bowel syndrome)    Ovarian cyst    Peritonitis, acute generalized ([place])    Pulmonary embolism ([place])    SBO (small bowel obstruction) ([place])    Sepsis ([place])     Patient Active Problem List   Diagnosis Date Noted   Hypokalemia 03/22/2021   GERD (gastroesophageal reflux disease)  03/22/2021   Pyelonephritis 03/21/2021   Controlled type 2 diabetes mellitus with hyperglycemia ([place]) 05/10/2020   Partial small bowel obstruction (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   Obesity (BMI 30-39.9) 09/25/2017   Type 2 diabetes mellitus, without long-term current use of insulin ([place]) 08/23/2017   Chronic pain syndrome 08/23/2017    Past Surgical History:  Procedure Laterality Date   APPENDECTOMY     ruptured    BUNIONECTOMY     CESAREAN SECTION     CESAREAN SECTION Bilateral 11/20/2017   Procedure: CESAREAN SECTION;  Surgeon: Sloan Leiter, MD;  Location: [facility];  Service: Obstetrics;  Laterality: Bilateral;   OVARIAN CYST DRAINAGE     SMALL BOWEL REPAIR       OB History     Gravida  3   Para  3   Term  1   Preterm  2   AB  0   Living  3      SAB  0   IAB  0  Ectopic  0   Multiple  0   Live Births  3           Family History  Problem Relation Age of Onset   Hypertension Mother    Anemia Mother    Diabetes Father     Social History   Tobacco Use   Smoking status: Some Days    Packs/day: 0.50    Types: Cigarettes   Smokeless tobacco: Never   Tobacco comments:    social use with tobacco  Vaping Use   Vaping Use: Former  Substance Use Topics   Alcohol use: Yes    Comment: occ   Drug use: Not Currently    Home Medications Prior to Admission medications   Medication Sig Start Date End Date Taking? Authorizing Provider  metoCLOPramide (REGLAN) 10 MG tablet Take 1 tablet  (10 mg total) by mouth every 6 (six) hours. 06/21/21  Yes Siddh Vandeventer T, PA-C  blood glucose meter kit and supplies KIT Dispense based on patient and insurance preference. Use up to four times daily as directed. (FOR ICD-9 250.00, 250.01). 02/21/19   Oretha Milch D, MD  cephALEXin (KEFLEX) 500 MG capsule Take 1 capsule (500 mg total) by mouth 4 (four) times daily. 04/01/21   Hayden Rasmussen, MD  cephALEXin (KEFLEX) 500 MG capsule Take 1 capsule (500 mg total) by mouth 4 (four) times daily. 04/25/21   Palumbo, April, MD  HUMULIN R 100 UNIT/ML injection Inject 20 Units into the skin 3 (three) times daily before meals. Per sliding scale 03/24/20   [provider]  hyoscyamine (LEVSIN SL) 0.125 MG SL tablet Place 1 tablet (0.125 mg total) under the tongue every 4 (four) hours as needed for cramping (abdominal pain). 04/15/21   Orpah Greek, MD  insulin detemir (LEVEMIR) 100 unit/ml SOLN Inject 20 Units into the skin 3 (three) times daily. 03/02/21   Matilde Haymaker, MD  Levonorgestrel (LILETTA) 19.5 MCG/DAY IUD IUD 1 each by Intrauterine route continuous. Placed in 2019    [provider]  loperamide (IMODIUM) 2 MG capsule Take 1 capsule (2 mg total) by mouth 4 (four) times daily as needed for diarrhea or loose stools. 04/08/21   Tedd Sias, PA  methocarbamol (ROBAXIN) 500 MG tablet Take 1 tablet (500 mg total) by mouth 2 (two) times daily. 03/27/21   Lacretia Leigh, MD  ondansetron (ZOFRAN ODT) 4 MG disintegrating tablet Take 1 tablet (4 mg total) by mouth every 8 (eight) hours as needed for nausea or vomiting. 04/08/21   Fondaw, Kathleene Hazel, PA  ondansetron (ZOFRAN ODT) 4 MG disintegrating tablet Take 1 tablet (4 mg total) by mouth every 8 (eight) hours as needed for nausea or vomiting. 04/25/21   Eustaquio Maize, PA-C  ondansetron (ZOFRAN ODT) 8 MG disintegrating tablet 8mg  ODT q8 hours prn nausea 04/25/21   Palumbo, April, MD  oxyCODONE-acetaminophen (PERCOCET/ROXICET) 5-325 MG tablet  Take 1 tablet by mouth every 6 (six) hours as needed for moderate pain or severe pain. 06/01/20   [provider]  polyethylene glycol (MIRALAX / GLYCOLAX) 17 g packet Take 17 g by mouth daily. 03/26/21   Arrien, Jimmy Picket, MD  potassium chloride (KLOR-CON) 10 MEQ tablet Take 1 tablet (10 mEq total) by mouth daily. 04/08/21   Tedd Sias, PA  potassium chloride (KLOR-CON) 10 MEQ tablet Take 1 tablet (10 mEq total) by mouth 2 (two) times daily. 04/25/21   Palumbo, April, MD  promethazine (PHENERGAN) 25 MG suppository Place 1  suppository (25 mg total) rectally every 6 (six) hours as needed for nausea or vomiting. 04/25/21   Eustaquio Maize, PA-C  promethazine (PHENERGAN) 25 MG tablet Take 1 tablet (25 mg total) by mouth every 6 (six) hours as needed for nausea or vomiting. 04/15/21   Pollina, Gwenyth Allegra, MD    Allergies    Hydrocodone, Iodinated diagnostic agents, Cetirizine & related, Toradol [ketorolac tromethamine], Flagyl [metronidazole], and Nsaids  Review of Systems   Review of Systems  Constitutional:  Negative for chills and fever.  Respiratory:  Negative for shortness of breath.   Cardiovascular:  Negative for chest pain.  Gastrointestinal:  Positive for abdominal pain, nausea and vomiting. Negative for constipation and diarrhea.  Genitourinary:  Positive for flank pain. Negative for dysuria.  All other systems reviewed and are negative.  Physical Exam Updated Vital Signs BP 117/73   Pulse (!) 110   Temp 98.4 F (36.9 C) (Oral)   Resp 18   Ht $R'5\' 7"'Lf$  (1.702 m)   Wt 63.5 kg   SpO2 100%   BMI 21.93 kg/m   Physical Exam Vitals and nursing note reviewed.  Constitutional:      Appearance: Normal appearance.  HENT:     Head: Normocephalic and atraumatic.  Eyes:     Conjunctiva/sclera: Conjunctivae normal.  Cardiovascular:     Rate and Rhythm: Normal rate and regular rhythm.  Pulmonary:     Effort: Pulmonary effort is normal. No respiratory distress.      Breath sounds: Normal breath sounds.  Abdominal:     General: There is no distension.     Palpations: Abdomen is soft.     Tenderness: There is abdominal tenderness in the suprapubic area. There is right CVA tenderness and left CVA tenderness.  Skin:    General: Skin is warm and dry.  Neurological:     General: No focal deficit present.     Mental Status: She is alert and oriented to person, place, and time.    ED Results / Procedures / Treatments   Labs (all labs ordered are listed, but only abnormal results are displayed) Labs Reviewed  COMPREHENSIVE METABOLIC PANEL - Abnormal; Notable for the following components:      Result Value   Sodium 132 (*)    Potassium 3.2 (*)    Chloride 92 (*)    Glucose, Bld 425 (*)    AST 13 (*)    All other components within normal limits  RAPID URINE DRUG SCREEN, HOSP PERFORMED - Abnormal; Notable for the following components:   Opiates POSITIVE (*)    Cocaine POSITIVE (*)    All other components within normal limits  URINALYSIS, ROUTINE W REFLEX MICROSCOPIC - Abnormal; Notable for the following components:   Glucose, UA >=500 (*)    Ketones, ur 15 (*)    All other components within normal limits  URINALYSIS, MICROSCOPIC (REFLEX) - Abnormal; Notable for the following components:   Bacteria, UA FEW (*)    All other components within normal limits  CBC WITH DIFFERENTIAL/PLATELET  LIPASE, BLOOD    EKG None  Radiology No results found.  Procedures Procedures   Medications Ordered in ED Medications  sodium chloride 0.9 % bolus 1,000 mL (0 mLs Intravenous Stopped 06/21/21 1308)  ondansetron (ZOFRAN) injection 4 mg (4 mg Intravenous Given 06/21/21 1203)  morphine 4 MG/ML injection 4 mg (4 mg Intravenous Given 06/21/21 1212)  metoCLOPramide (REGLAN) injection 10 mg (10 mg Intravenous Given 06/21/21 1246)    ED Course  I have reviewed the triage vital signs and the nursing notes.  Pertinent labs & imaging results that were available  during my care of the patient were reviewed by me and considered in my medical decision making (see chart for details).     MDM Rules/Calculators/A&P                           Patient is 35 y/o female who presents with abdominal pain and flank pain x 3 days. Patient was seen on 8/26 for similar symptoms, workup at that time including urinalysis and imaging was unremarkable and patient was discharged to home in stable condition.   On exam today patient has tenderness to palpation of suprapubic area. Given antiemetics and pain medication. Lab work positive for hyperglycemia without DKA, UDS positive for cocaine and opiates.  Urinalysis negative for signs of infection.    On reevaluation, patient reports marked improvement in her abdominal pain and nausea.  Patient states she has not been taking insulin as regularly as she is prescribed, because it is difficult to obtain with her living in a shelter.  Discussed importance of blood sugar management with her elevated levels today.  Discussed cessation of cocaine use, as it can exacerbate her symptoms.  She appears otherwise well and vitals are stable, she is stable for discharge. I do not believe she requires admission or inpatient treatment for her symptoms at this time.  I will write a prescription for metoclopramide for her to take outpatient. Discussed reasons for returning to the ED.  All questions answered, patient agreeable to plan.  Final Clinical Impression(s) / ED Diagnoses Final diagnoses:  Lower abdominal pain    Rx / DC Orders ED Discharge Orders          Ordered    metoCLOPramide (REGLAN) 10 MG tablet  Every 6 hours        06/21/21 1520             Yakelin Grenier T, PA-C 06/21/21 1543    Noemi Chapel, MD 07/02/21 [phone]

## 2021-06-21 NOTE — Discharge Instructions (Addendum)
Your blood work urine test today were unremarkable for infection.  I am reassured by the fact that you had a normal CT of your abdomen 4 days ago.  Your urine drug screen today was positive for cocaine.  As we discussed, taking cocaine can often make your abdominal pain worse.    I am writing a prescription today for Reglan which is an antinausea medication, that also has some benefits for abdominal pain.  It is important you continue checking your blood sugar and taking insulin.

## 2021-06-21 NOTE — ED Notes (Signed)
Patient Alert and oriented to baseline. Stable and ambulatory to baseline. Patient verbalized understanding of the discharge instructions.  Patient belongings were taken by the patient.   

## 2021-06-21 NOTE — ED Notes (Signed)
Pt agitated and writhing on str states needs pain meds and something for vomiting, 36 year old son with pt

## 2021-06-21 NOTE — ED Notes (Signed)
Pt up to ambulate with asssit to BR

## 2021-06-21 NOTE — ED Triage Notes (Signed)
Pt c/o left side abd/flank pain x 3-4 days-NAD-slow gait

## 2021-07-30 ENCOUNTER — Emergency Department (HOSPITAL_BASED_OUTPATIENT_CLINIC_OR_DEPARTMENT_OTHER)
Admission: EM | Admit: 2021-07-30 | Discharge: 2021-07-30 | Disposition: A | Discharge status: Home / Self Care | Payer: Medicaid Other | Attending: Emergency Medicine | Admitting: Emergency Medicine

## 2021-07-30 ENCOUNTER — Emergency Department (HOSPITAL_BASED_OUTPATIENT_CLINIC_OR_DEPARTMENT_OTHER): Payer: Medicaid Other

## 2021-07-30 ENCOUNTER — Other Ambulatory Visit: Payer: Self-pay

## 2021-07-30 ENCOUNTER — Encounter (HOSPITAL_BASED_OUTPATIENT_CLINIC_OR_DEPARTMENT_OTHER): Payer: Self-pay | Admitting: *Deleted

## 2021-07-30 DIAGNOSIS — Z794 Long term (current) use of insulin: Secondary | ICD-10-CM | POA: Insufficient documentation

## 2021-07-30 DIAGNOSIS — Z79899 Other long term (current) drug therapy: Secondary | ICD-10-CM | POA: Diagnosis not present

## 2021-07-30 DIAGNOSIS — M25512 Pain in left shoulder: Secondary | ICD-10-CM | POA: Insufficient documentation

## 2021-07-30 DIAGNOSIS — Z87891 Personal history of nicotine dependence: Secondary | ICD-10-CM | POA: Insufficient documentation

## 2021-07-30 DIAGNOSIS — M546 Pain in thoracic spine: Secondary | ICD-10-CM | POA: Diagnosis not present

## 2021-07-30 DIAGNOSIS — Z86018 Personal history of other benign neoplasm: Secondary | ICD-10-CM | POA: Diagnosis not present

## 2021-07-30 DIAGNOSIS — E119 Type 2 diabetes mellitus without complications: Secondary | ICD-10-CM | POA: Insufficient documentation

## 2021-07-30 DIAGNOSIS — Y92410 Unspecified street and highway as the place of occurrence of the external cause: Secondary | ICD-10-CM | POA: Insufficient documentation

## 2021-07-30 DIAGNOSIS — I10 Essential (primary) hypertension: Secondary | ICD-10-CM | POA: Diagnosis not present

## 2021-07-30 LAB — CBC WITH DIFFERENTIAL/PLATELET
Abs Immature Granulocytes: 0.02 10*3/uL (ref 0.00–0.07)
Basophils Absolute: 0 10*3/uL (ref 0.0–0.1)
Basophils Relative: 1 %
Eosinophils Absolute: 0.1 10*3/uL (ref 0.0–0.5)
Eosinophils Relative: 2 %
HCT: 36.8 % (ref 36.0–46.0)
Hemoglobin: 12.2 g/dL (ref 12.0–15.0)
Immature Granulocytes: 0 %
Lymphocytes Relative: 23 %
Lymphs Abs: 1 10*3/uL (ref 0.7–4.0)
MCH: 27.7 pg (ref 26.0–34.0)
MCHC: 33.2 g/dL (ref 30.0–36.0)
MCV: 83.4 fL (ref 80.0–100.0)
Monocytes Absolute: 0.2 10*3/uL (ref 0.1–1.0)
Monocytes Relative: 4 %
Neutro Abs: 3.1 10*3/uL (ref 1.7–7.7)
Neutrophils Relative %: 70 %
Platelets: 305 10*3/uL (ref 150–400)
RBC: 4.41 MIL/uL (ref 3.87–5.11)
RDW: 12.9 % (ref 11.5–15.5)
WBC: 4.5 10*3/uL (ref 4.0–10.5)
nRBC: 0 % (ref 0.0–0.2)

## 2021-07-30 LAB — BASIC METABOLIC PANEL
Anion gap: 7 (ref 5–15)
BUN: 10 mg/dL (ref 6–20)
CO2: 29 mmol/L (ref 22–32)
Calcium: 8.5 mg/dL — ABNORMAL LOW (ref 8.9–10.3)
Chloride: 96 mmol/L — ABNORMAL LOW (ref 98–111)
Creatinine, Ser: 0.74 mg/dL (ref 0.44–1.00)
GFR, Estimated: >60 mL/min (ref >60)
Glucose, Bld: 374 mg/dL — ABNORMAL HIGH (ref 70–99)
Potassium: 3.7 mmol/L (ref 3.5–5.1)
Sodium: 132 mmol/L — ABNORMAL LOW (ref 135–145)

## 2021-07-30 MED ORDER — MORPHINE SULFATE (PF) 2 MG/ML IV SOLN
2.0000 mg | Freq: Once | INTRAVENOUS | Status: AC
  Administered 2021-07-30: 2 mg via INTRAVENOUS
  Filled 2021-07-30: qty 1

## 2021-07-30 MED ORDER — INSULIN ASPART 100 UNIT/ML IJ SOLN
5.0000 [IU] | Freq: Once | INTRAMUSCULAR | Status: AC
  Administered 2021-07-30: 5 [IU] via SUBCUTANEOUS

## 2021-07-30 MED ORDER — CYCLOBENZAPRINE HCL 10 MG PO TABS: 10.0000 mg | ORAL_TABLET | Freq: Two times a day (BID) | ORAL | 0 refills | Status: DC | PRN

## 2021-07-30 MED ORDER — MORPHINE SULFATE (PF) 2 MG/ML IV SOLN
2.0000 mg | Freq: Once | INTRAVENOUS | Status: AC
Start: 2021-07-30 — End: 2021-07-30
  Administered 2021-07-30: 2 mg via INTRAVENOUS
  Filled 2021-07-30: qty 1

## 2021-07-30 MED ORDER — MORPHINE SULFATE (PF) 2 MG/ML IV SOLN
1.0000 mg | Freq: Once | INTRAVENOUS | Status: DC
  Filled 2021-07-30: qty 1

## 2021-07-30 MED ORDER — SODIUM CHLORIDE 0.9 % IV BOLUS
1000.0000 mL | Freq: Once | INTRAVENOUS | Status: AC
  Administered 2021-07-30: 1000 mL via INTRAVENOUS

## 2021-07-30 MED ORDER — MORPHINE SULFATE (PF) 4 MG/ML IV SOLN
4.0000 mg | Freq: Once | INTRAVENOUS | Status: DC
  Filled 2021-07-30: qty 1
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
## 2021-07-30 NOTE — ED Notes (Signed)
Pt aware we need a urine sample.  Sts she is unable to urinate at this time.

## 2021-07-30 NOTE — ED Notes (Signed)
Pain assessment at 1737 reflects initial pain score.  Unable to change time. Pt c/o 10/10 pain in L shoulder and back.

## 2021-07-30 NOTE — ED Notes (Signed)
Patient transported to X-ray 

## 2021-08-13 ENCOUNTER — Other Ambulatory Visit: Payer: Self-pay

## 2021-08-13 ENCOUNTER — Encounter (HOSPITAL_BASED_OUTPATIENT_CLINIC_OR_DEPARTMENT_OTHER): Payer: Self-pay

## 2021-08-13 ENCOUNTER — Emergency Department (HOSPITAL_BASED_OUTPATIENT_CLINIC_OR_DEPARTMENT_OTHER)
Admission: EM | Admit: 2021-08-13 | Discharge: 2021-08-13 | Disposition: A | Discharge status: Home / Self Care | Payer: Medicaid Other | Attending: Emergency Medicine | Admitting: Emergency Medicine

## 2021-08-13 DIAGNOSIS — E119 Type 2 diabetes mellitus without complications: Secondary | ICD-10-CM | POA: Insufficient documentation

## 2021-08-13 DIAGNOSIS — I10 Essential (primary) hypertension: Secondary | ICD-10-CM | POA: Insufficient documentation

## 2021-08-13 DIAGNOSIS — Z794 Long term (current) use of insulin: Secondary | ICD-10-CM | POA: Insufficient documentation

## 2021-08-13 DIAGNOSIS — N3000 Acute cystitis without hematuria: Secondary | ICD-10-CM | POA: Insufficient documentation

## 2021-08-13 DIAGNOSIS — Z87891 Personal history of nicotine dependence: Secondary | ICD-10-CM | POA: Insufficient documentation

## 2021-08-13 DIAGNOSIS — M546 Pain in thoracic spine: Secondary | ICD-10-CM | POA: Insufficient documentation

## 2021-08-13 DIAGNOSIS — Z79899 Other long term (current) drug therapy: Secondary | ICD-10-CM | POA: Insufficient documentation

## 2021-08-13 DIAGNOSIS — R109 Unspecified abdominal pain: Secondary | ICD-10-CM | POA: Diagnosis present

## 2021-08-13 LAB — URINALYSIS, ROUTINE W REFLEX MICROSCOPIC
Bilirubin Urine: NEGATIVE
Glucose, UA: >=500 mg/dL — AB
Hgb urine dipstick: NEGATIVE
Ketones, ur: NEGATIVE mg/dL
Leukocytes,Ua: NEGATIVE
Nitrite: POSITIVE — AB
Protein, ur: NEGATIVE mg/dL
Specific Gravity, Urine: 1.015 (ref 1.005–1.030)
pH: 7 (ref 5.0–8.0)

## 2021-08-13 LAB — URINALYSIS, MICROSCOPIC (REFLEX): RBC / HPF: NONE SEEN RBC/hpf (ref 0–5)

## 2021-08-13 LAB — PREGNANCY, URINE: Preg Test, Ur: NEGATIVE

## 2021-08-13 MED ORDER — OXYCODONE-ACETAMINOPHEN 5-325 MG PO TABS: 1.0000 | ORAL_TABLET | Freq: Once | ORAL | Status: DC

## 2021-08-13 MED ORDER — TIZANIDINE HCL 2 MG PO CAPS: 2.0000 mg | ORAL_CAPSULE | Freq: Three times a day (TID) | ORAL | 0 refills | Status: DC | PRN

## 2021-08-13 MED ORDER — HYDROMORPHONE HCL 1 MG/ML IJ SOLN
1.0000 mg | Freq: Once | INTRAMUSCULAR | Status: AC
  Administered 2021-08-13: 1 mg via INTRAMUSCULAR
  Filled 2021-08-13: qty 1

## 2021-08-13 MED ORDER — TIZANIDINE HCL 2 MG PO TABS: 2.0000 mg | ORAL_TABLET | Freq: Once | ORAL | Status: DC

## 2021-08-13 MED ORDER — SULFAMETHOXAZOLE-TRIMETHOPRIM 800-160 MG PO TABS: 1.0000 | ORAL_TABLET | Freq: Two times a day (BID) | ORAL | 0 refills | Status: AC

## 2021-08-13 MED ORDER — HYDROMORPHONE HCL 1 MG/ML IJ SOLN
0.5000 mg | Freq: Once | INTRAMUSCULAR | Status: DC
Start: 2021-08-13 — End: 2021-08-13

## 2021-08-13 NOTE — ED Notes (Signed)
Lab called to add on urine culture.  ?

## 2021-08-13 NOTE — ED Notes (Signed)
ED Provider at bedside. 
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
## 2021-08-13 NOTE — Discharge Instructions (Addendum)
Recommend continuing 1000 mg of Tylenol every 6 hours as needed for pain.  Recommend continuing using lidocaine patches over-the-counter.  Please try taking tizanidine instead of your Robaxin or Flexeril/cyclobenzaprine.  This is a different type of muscle relaxant that may be beneficial for you.  This medication can be titrated to a higher dose as you take it for a while.  Please talk to your primary care doctor about that as well as further pain management as discussed.  Urine study shows possible infection, will we will send a urine culture to confirm but at this time we will treat with antibiotics
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
## 2021-08-15 LAB — URINE CULTURE: Culture: >=100000 — AB

## 2021-09-03 ENCOUNTER — Other Ambulatory Visit: Payer: Self-pay

## 2021-09-03 ENCOUNTER — Encounter (HOSPITAL_BASED_OUTPATIENT_CLINIC_OR_DEPARTMENT_OTHER): Payer: Self-pay | Admitting: Emergency Medicine

## 2021-09-03 ENCOUNTER — Emergency Department (HOSPITAL_BASED_OUTPATIENT_CLINIC_OR_DEPARTMENT_OTHER): Payer: Medicaid Other

## 2021-09-03 ENCOUNTER — Emergency Department (HOSPITAL_BASED_OUTPATIENT_CLINIC_OR_DEPARTMENT_OTHER)
Admission: EM | Admit: 2021-09-03 | Discharge: 2021-09-04 | Disposition: A | Discharge status: Home / Self Care | Payer: Medicaid Other | Attending: Emergency Medicine | Admitting: Emergency Medicine

## 2021-09-03 DIAGNOSIS — E119 Type 2 diabetes mellitus without complications: Secondary | ICD-10-CM | POA: Diagnosis not present

## 2021-09-03 DIAGNOSIS — E876 Hypokalemia: Secondary | ICD-10-CM | POA: Diagnosis not present

## 2021-09-03 DIAGNOSIS — Z87891 Personal history of nicotine dependence: Secondary | ICD-10-CM | POA: Diagnosis not present

## 2021-09-03 DIAGNOSIS — Z794 Long term (current) use of insulin: Secondary | ICD-10-CM | POA: Diagnosis not present

## 2021-09-03 DIAGNOSIS — R109 Unspecified abdominal pain: Secondary | ICD-10-CM | POA: Diagnosis not present

## 2021-09-03 DIAGNOSIS — N12 Tubulo-interstitial nephritis, not specified as acute or chronic: Secondary | ICD-10-CM

## 2021-09-03 LAB — CBC WITH DIFFERENTIAL/PLATELET
Abs Immature Granulocytes: 0.01 10*3/uL (ref 0.00–0.07)
Basophils Absolute: 0 10*3/uL (ref 0.0–0.1)
Basophils Relative: 0 %
Eosinophils Absolute: 0.1 10*3/uL (ref 0.0–0.5)
Eosinophils Relative: 1 %
HCT: 39.8 % (ref 36.0–46.0)
Hemoglobin: 12.8 g/dL (ref 12.0–15.0)
Immature Granulocytes: 0 %
Lymphocytes Relative: 9 %
Lymphs Abs: 0.5 10*3/uL — ABNORMAL LOW (ref 0.7–4.0)
MCH: 26.9 pg (ref 26.0–34.0)
MCHC: 32.2 g/dL (ref 30.0–36.0)
MCV: 83.6 fL (ref 80.0–100.0)
Monocytes Absolute: 0.6 10*3/uL (ref 0.1–1.0)
Monocytes Relative: 11 %
Neutro Abs: 4.5 10*3/uL (ref 1.7–7.7)
Neutrophils Relative %: 79 %
Platelets: 324 10*3/uL (ref 150–400)
RBC: 4.76 MIL/uL (ref 3.87–5.11)
RDW: 13.8 % (ref 11.5–15.5)
WBC: 5.6 10*3/uL (ref 4.0–10.5)
nRBC: 0 % (ref 0.0–0.2)

## 2021-09-03 LAB — HEPATIC FUNCTION PANEL
ALT: 6 U/L (ref 0–44)
AST: 9 U/L — ABNORMAL LOW (ref 15–41)
Albumin: 4 g/dL (ref 3.5–5.0)
Alkaline Phosphatase: 58 U/L (ref 38–126)
Bilirubin, Direct: 0.2 mg/dL (ref 0.0–0.2)
Indirect Bilirubin: 0.8 mg/dL (ref 0.3–0.9)
Total Bilirubin: 1 mg/dL (ref 0.3–1.2)
Total Protein: 7.4 g/dL (ref 6.5–8.1)

## 2021-09-03 LAB — BASIC METABOLIC PANEL
Anion gap: 9 (ref 5–15)
BUN: 11 mg/dL (ref 6–20)
CO2: 33 mmol/L — ABNORMAL HIGH (ref 22–32)
Calcium: 9.4 mg/dL (ref 8.9–10.3)
Chloride: 96 mmol/L — ABNORMAL LOW (ref 98–111)
Creatinine, Ser: 0.75 mg/dL (ref 0.44–1.00)
GFR, Estimated: >60 mL/min (ref >60)
Glucose, Bld: 232 mg/dL — ABNORMAL HIGH (ref 70–99)
Potassium: 2.4 mmol/L — CL (ref 3.5–5.1)
Sodium: 138 mmol/L (ref 135–145)

## 2021-09-03 LAB — MAGNESIUM: Magnesium: 1.7 mg/dL (ref 1.7–2.4)

## 2021-09-03 MED ORDER — LACTATED RINGERS IV BOLUS
1000.0000 mL | Freq: Once | INTRAVENOUS | Status: AC
  Administered 2021-09-03: 1000 mL via INTRAVENOUS

## 2021-09-03 MED ORDER — POTASSIUM CHLORIDE CRYS ER 20 MEQ PO TBCR
40.0000 meq | EXTENDED_RELEASE_TABLET | Freq: Once | ORAL | Status: AC
  Administered 2021-09-03: 40 meq via ORAL
  Filled 2021-09-03: qty 2

## 2021-09-03 MED ORDER — POTASSIUM CHLORIDE 10 MEQ/100ML IV SOLN
10.0000 meq | INTRAVENOUS | Status: AC
  Administered 2021-09-03 – 2021-09-04 (×4): 10 meq via INTRAVENOUS
  Filled 2021-09-03 (×4): qty 100

## 2021-09-03 MED ORDER — ONDANSETRON HCL 4 MG/2ML IJ SOLN
4.0000 mg | Freq: Once | INTRAMUSCULAR | Status: AC
  Administered 2021-09-03: 4 mg via INTRAVENOUS
  Filled 2021-09-03: qty 2

## 2021-09-03 MED ORDER — FENTANYL CITRATE PF 50 MCG/ML IJ SOSY
50.0000 ug | PREFILLED_SYRINGE | Freq: Once | INTRAMUSCULAR | Status: AC
  Administered 2021-09-03: 50 ug via INTRAVENOUS
  Filled 2021-09-03: qty 1

## 2021-09-03 MED ORDER — HYDROMORPHONE HCL 1 MG/ML IJ SOLN
1.0000 mg | Freq: Once | INTRAMUSCULAR | Status: AC
Start: 2021-09-03 — End: 2021-09-03
  Administered 2021-09-03: 1 mg via INTRAVENOUS
  Filled 2021-09-03: qty 1

## 2021-09-03 NOTE — ED Notes (Signed)
Patient transported to CT 

## 2021-09-03 NOTE — ED Notes (Signed)
Patient attempted to collect urine, patient unable to void at this time.

## 2021-09-03 NOTE — ED Triage Notes (Signed)
Pt presents to ED POV. Pt c/o bilateral flank and back pain since yesterday. Reports hx of pylonephritis

## 2021-09-03 NOTE — ED Provider Notes (Signed)
[facility] EMERGENCY DEPT Provider Note   CSN: [id number] Arrival date & time: 09/03/21  1727     History Chief Complaint  Patient presents with   Flank Pain    Jodi Mcgee is a 35 y.o. female.  The history is provided by the patient. No language interpreter was used.  Flank Pain This is a new problem. The current episode started 6 to 12 hours ago. The problem occurs constantly. Associated symptoms include abdominal pain. Nothing aggravates the symptoms. She has tried nothing for the symptoms. The treatment provided no relief.    Pt complains of right flank pain.  Pt reports she has a history of kidney stones and kidney infections.  Pt reports she is diabetic   Past Medical History:  Diagnosis Date   Anemia    Anxiety    Blood transfusion without reported diagnosis    both CS   Cocaine abuse ([place])    Diabetes mellitus without complication ([place])    GERD (gastroesophageal reflux disease)    has resolved   IBS (irritable bowel syndrome)    Ovarian cyst    Peritonitis, acute generalized ([place])    Pulmonary embolism (HCC)    SBO (small bowel obstruction) ([place])    Sepsis ([place])     Patient Active Problem List   Diagnosis Date Noted   Hypokalemia 03/22/2021   GERD (gastroesophageal reflux disease) 03/22/2021   Pyelonephritis 03/21/2021   Controlled type 2 diabetes mellitus with hyperglycemia ([place]) 05/10/2020   Partial small bowel obstruction ([place]) 05/09/2020   Bartholin's gland abscess 02/21/2019   DKA (diabetic ketoacidoses) 02/20/2019   Blood per rectum 02/20/2019   Hyponatremia 09/26/2018   Chronic hypertension 01/03/2018   IUD contraception 01/03/2018   Pulmonary embolism during puerperium 12/04/2017   Pulmonary edema 11/30/2017   Pelvic adhesive disease 10/22/2017   Nasal septal perforation 10/17/2017   History of cocaine abuse ([place]) 10/17/2017   Epistaxis 10/17/2017   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   Obesity (BMI 30-39.9)  09/25/2017   Type 2 diabetes mellitus, without long-term current use of insulin ([place]) 08/23/2017   Chronic pain syndrome 08/23/2017    Past Surgical History:  Procedure Laterality Date   APPENDECTOMY     ruptured    BUNIONECTOMY     CESAREAN SECTION     CESAREAN SECTION Bilateral 11/20/2017   Procedure: CESAREAN SECTION;  Surgeon: Sloan Leiter, MD;  Location: [facility];  Service: Obstetrics;  Laterality: Bilateral;   OVARIAN CYST DRAINAGE     SMALL BOWEL REPAIR       OB History     Gravida  3   Para  3   Term  1   Preterm  2   AB  0   Living  3      SAB  0   IAB  0   Ectopic  0   Multiple  0   Live Births  3           Family History  Problem Relation Age of Onset   Hypertension Mother    Anemia Mother    Diabetes Father     Social History   Tobacco Use   Smoking status: Former    Packs/day: 0.50    Types: Cigarettes   Smokeless tobacco: Never   Tobacco comments:    social use with tobacco  Vaping Use   Vaping Use: Former  Substance Use Topics   Alcohol use: Yes    Comment: occ  Drug use: Not Currently    Home Medications Prior to Admission medications   Medication Sig Start Date End Date Taking? Authorizing Provider  blood glucose meter kit and supplies KIT Dispense based on patient and insurance preference. Use up to four times daily as directed. (FOR ICD-9 250.00, 250.01). 02/21/19   Oretha Milch D, MD  HUMULIN R 100 UNIT/ML injection Inject 20 Units into the skin 3 (three) times daily before meals. Per sliding scale 03/24/20   [provider]  hyoscyamine (LEVSIN SL) 0.125 MG SL tablet Place 1 tablet (0.125 mg total) under the tongue every 4 (four) hours as needed for cramping (abdominal pain). 04/15/21   Orpah Greek, MD  insulin detemir (LEVEMIR) 100 unit/ml SOLN Inject 20 Units into the skin 3 (three) times daily. 03/02/21   Matilde Haymaker, MD  Levonorgestrel (LILETTA) 19.5 MCG/DAY IUD IUD 1 each by  Intrauterine route continuous. Placed in 2019    [provider]  loperamide (IMODIUM) 2 MG capsule Take 1 capsule (2 mg total) by mouth 4 (four) times daily as needed for diarrhea or loose stools. 04/08/21   Tedd Sias, PA  oxyCODONE-acetaminophen (PERCOCET/ROXICET) 5-325 MG tablet Take 1 tablet by mouth every 6 (six) hours as needed for moderate pain or severe pain. 06/01/20   [provider]  polyethylene glycol (MIRALAX / GLYCOLAX) 17 g packet Take 17 g by mouth daily. 03/26/21   Arrien, Jimmy Picket, MD  potassium chloride (KLOR-CON) 10 MEQ tablet Take 1 tablet (10 mEq total) by mouth daily. 04/08/21   Tedd Sias, PA  potassium chloride (KLOR-CON) 10 MEQ tablet Take 1 tablet (10 mEq total) by mouth 2 (two) times daily. 04/25/21   Palumbo, April, MD  tizanidine (ZANAFLEX) 2 MG capsule Take 1 capsule (2 mg total) by mouth 3 (three) times daily as needed for up to 30 doses for muscle spasms. 08/13/21   [place], Adam, DO    Allergies    Hydrocodone, Iodinated diagnostic agents, Cetirizine & related, Toradol [ketorolac tromethamine], Flagyl [metronidazole], and Nsaids  Review of Systems   Review of Systems  Gastrointestinal:  Positive for abdominal pain.  Genitourinary:  Positive for flank pain.  All other systems reviewed and are negative.  Physical Exam Updated Vital Signs BP 124/75 (BP Location: Right Arm)   Pulse (!) 114   Temp 98.4 F (36.9 C) (Oral)   Resp 18   SpO2 100%   Physical Exam Vitals and nursing note reviewed.  Constitutional:      Appearance: She is well-developed.  HENT:     Head: Normocephalic.     Mouth/Throat:     Mouth: Mucous membranes are moist.  Eyes:     Pupils: Pupils are equal, round, and reactive to light.  Cardiovascular:     Rate and Rhythm: Normal rate.  Pulmonary:     Effort: Pulmonary effort is normal.  Abdominal:     General: There is no distension.     Comments: Tender low back diffusely    Musculoskeletal:         General: Normal range of motion.     Cervical back: Normal range of motion.  Skin:    General: Skin is warm.  Neurological:     General: No focal deficit present.     Mental Status: She is alert and oriented to person, place, and time.  Psychiatric:        Mood and Affect: Mood normal.    ED Results / Procedures / Treatments  Labs (all labs ordered are listed, but only abnormal results are displayed) Labs Reviewed  CBC WITH DIFFERENTIAL/PLATELET - Abnormal; Notable for the following components:      Result Value   Lymphs Abs 0.5 (*)    All other components within normal limits  BASIC METABOLIC PANEL - Abnormal; Notable for the following components:   Potassium 2.4 (*)    Chloride 96 (*)    CO2 33 (*)    Glucose, Bld 232 (*)    All other components within normal limits  MAGNESIUM  URINALYSIS, ROUTINE W REFLEX MICROSCOPIC  PREGNANCY, URINE  RAPID URINE DRUG SCREEN, HOSP PERFORMED    EKG None  Radiology No results found.  Procedures Procedures   Medications Ordered in ED Medications  potassium chloride 10 mEq in 100 mL IVPB (has no administration in time range)  HYDROmorphone (DILAUDID) injection 1 mg (1 mg Intravenous Given 09/03/21 2012)  ondansetron (ZOFRAN) injection 4 mg (4 mg Intravenous Given 09/03/21 2010)  potassium chloride SA (KLOR-CON) CR tablet 40 mEq (40 mEq Oral Given 09/03/21 2046)    ED Course  I have reviewed the triage vital signs and the nursing notes.  Pertinent labs & imaging results that were available during my care of the patient were reviewed by me and considered in my medical decision making (see chart for details).    MDM Rules/Calculators/A&P                           MDM:  Pt's labs returned potassium is 2.4.  Pt started on potassium IV.   Final Clinical Impression(s) / ED Diagnoses Final diagnoses:  Hypokalemia  Acute right flank pain    Rx / DC Orders ED Discharge Orders     None        Sidney Ace 09/03/21 2216    Sherwood Gambler, MD 09/04/21 [phone]

## 2021-09-03 NOTE — ED Provider Notes (Signed)
11:32 PM Assumed care from Dr. Regenia Skeeter, please see their note for full history, physical and decision making until this point. In brief this is a 35 y.o. year old female who presented to the ED tonight with Flank Pain     Patient found to be tachycardic, hypokalemic. Pending fluids/K/UA and reeval with repeat ECG.   Patient has pain. Feels similar to pyelo in past. HR has improved. Other VS WNL. Will add on LFT's as her pain is right sided. Will get another liter of fluids and reasses.   UA appears infected. Rocephin ordered.   Patient sleeping persistently. When awoken she will state that she has severe pain but is sleeping through the 10/10 pain and doesn't want tylenol. Stable for d/c.   Discharge instructions, including strict return precautions for new or worsening symptoms, given. Patient and/or family verbalized understanding and agreement with the plan as described.   Labs, studies and imaging reviewed by myself and considered in medical decision making if ordered. Imaging interpreted by radiology.  Labs Reviewed  CBC WITH DIFFERENTIAL/PLATELET - Abnormal; Notable for the following components:      Result Value   Lymphs Abs 0.5 (*)    All other components within normal limits  BASIC METABOLIC PANEL - Abnormal; Notable for the following components:   Potassium 2.4 (*)    Chloride 96 (*)    CO2 33 (*)    Glucose, Bld 232 (*)    All other components within normal limits  MAGNESIUM  URINALYSIS, ROUTINE W REFLEX MICROSCOPIC  PREGNANCY, URINE  RAPID URINE DRUG SCREEN, HOSP PERFORMED  HEPATIC FUNCTION PANEL    CT Renal Stone Study  Final Result      No follow-ups on file.    Everli Rother, Corene Cornea, MD 09/04/21 [phone]

## 2021-09-04 LAB — URINALYSIS, ROUTINE W REFLEX MICROSCOPIC
Bilirubin Urine: NEGATIVE
Glucose, UA: NEGATIVE mg/dL
Ketones, ur: NEGATIVE mg/dL
Nitrite: NEGATIVE
Protein, ur: 30 mg/dL — AB
Specific Gravity, Urine: 1.024 (ref 1.005–1.030)
pH: 6 (ref 5.0–8.0)

## 2021-09-04 LAB — RAPID URINE DRUG SCREEN, HOSP PERFORMED
Amphetamines: NOT DETECTED
Barbiturates: NOT DETECTED
Benzodiazepines: NOT DETECTED
Cocaine: POSITIVE — AB
Opiates: POSITIVE — AB
Tetrahydrocannabinol: NOT DETECTED

## 2021-09-04 LAB — PREGNANCY, URINE: Preg Test, Ur: NEGATIVE

## 2021-09-04 MED ORDER — ONDANSETRON 4 MG PO TBDP: ORAL_TABLET | ORAL | 0 refills | Status: DC

## 2021-09-04 MED ORDER — ACETAMINOPHEN 500 MG PO TABS
1000.0000 mg | ORAL_TABLET | Freq: Once | ORAL | Status: AC
  Administered 2021-09-04: 1000 mg via ORAL
  Filled 2021-09-04: qty 2

## 2021-09-04 MED ORDER — CEPHALEXIN 500 MG PO CAPS: 500.0000 mg | ORAL_CAPSULE | Freq: Four times a day (QID) | ORAL | 0 refills | Status: DC

## 2021-09-04 MED ORDER — OXYCODONE-ACETAMINOPHEN 5-325 MG PO TABS: 1.0000 | ORAL_TABLET | Freq: Three times a day (TID) | ORAL | 0 refills | Status: DC | PRN

## 2021-09-04 MED ORDER — SODIUM CHLORIDE 0.9 % IV SOLN
2.0000 g | Freq: Once | INTRAVENOUS | Status: AC
  Administered 2021-09-04: 2 g via INTRAVENOUS
  Filled 2021-09-04: qty 20

## 2021-09-05 ENCOUNTER — Other Ambulatory Visit: Payer: Self-pay

## 2021-09-05 ENCOUNTER — Encounter (HOSPITAL_BASED_OUTPATIENT_CLINIC_OR_DEPARTMENT_OTHER): Payer: Self-pay | Admitting: Obstetrics and Gynecology

## 2021-09-05 ENCOUNTER — Emergency Department (HOSPITAL_BASED_OUTPATIENT_CLINIC_OR_DEPARTMENT_OTHER)
Admission: EM | Admit: 2021-09-05 | Discharge: 2021-09-05 | Disposition: A | Discharge status: Home / Self Care | Payer: Medicaid Other | Attending: Emergency Medicine | Admitting: Emergency Medicine

## 2021-09-05 DIAGNOSIS — Z794 Long term (current) use of insulin: Secondary | ICD-10-CM | POA: Insufficient documentation

## 2021-09-05 DIAGNOSIS — Z20822 Contact with and (suspected) exposure to covid-19: Secondary | ICD-10-CM | POA: Diagnosis not present

## 2021-09-05 DIAGNOSIS — E1110 Type 2 diabetes mellitus with ketoacidosis without coma: Secondary | ICD-10-CM | POA: Insufficient documentation

## 2021-09-05 DIAGNOSIS — N12 Tubulo-interstitial nephritis, not specified as acute or chronic: Secondary | ICD-10-CM | POA: Insufficient documentation

## 2021-09-05 DIAGNOSIS — J101 Influenza due to other identified influenza virus with other respiratory manifestations: Secondary | ICD-10-CM | POA: Diagnosis not present

## 2021-09-05 DIAGNOSIS — Z86018 Personal history of other benign neoplasm: Secondary | ICD-10-CM | POA: Insufficient documentation

## 2021-09-05 DIAGNOSIS — R109 Unspecified abdominal pain: Secondary | ICD-10-CM | POA: Diagnosis present

## 2021-09-05 DIAGNOSIS — Z87891 Personal history of nicotine dependence: Secondary | ICD-10-CM | POA: Diagnosis not present

## 2021-09-05 LAB — CBC WITH DIFFERENTIAL/PLATELET
Abs Immature Granulocytes: 0.01 10*3/uL (ref 0.00–0.07)
Basophils Absolute: 0 10*3/uL (ref 0.0–0.1)
Basophils Relative: 0 %
Eosinophils Absolute: 0 10*3/uL (ref 0.0–0.5)
Eosinophils Relative: 1 %
HCT: 34.5 % — ABNORMAL LOW (ref 36.0–46.0)
Hemoglobin: 11.3 g/dL — ABNORMAL LOW (ref 12.0–15.0)
Immature Granulocytes: 0 %
Lymphocytes Relative: 27 %
Lymphs Abs: 0.9 10*3/uL (ref 0.7–4.0)
MCH: 27.4 pg (ref 26.0–34.0)
MCHC: 32.8 g/dL (ref 30.0–36.0)
MCV: 83.5 fL (ref 80.0–100.0)
Monocytes Absolute: 0.3 10*3/uL (ref 0.1–1.0)
Monocytes Relative: 10 %
Neutro Abs: 2 10*3/uL (ref 1.7–7.7)
Neutrophils Relative %: 62 %
Platelets: 223 10*3/uL (ref 150–400)
RBC: 4.13 MIL/uL (ref 3.87–5.11)
RDW: 13.3 % (ref 11.5–15.5)
WBC: 3.3 10*3/uL — ABNORMAL LOW (ref 4.0–10.5)
nRBC: 0 % (ref 0.0–0.2)

## 2021-09-05 LAB — COMPREHENSIVE METABOLIC PANEL
ALT: 6 U/L (ref 0–44)
AST: 9 U/L — ABNORMAL LOW (ref 15–41)
Albumin: 3.2 g/dL — ABNORMAL LOW (ref 3.5–5.0)
Alkaline Phosphatase: 46 U/L (ref 38–126)
Anion gap: 8 (ref 5–15)
BUN: <5 mg/dL — ABNORMAL LOW (ref 6–20)
CO2: 30 mmol/L (ref 22–32)
Calcium: 8.6 mg/dL — ABNORMAL LOW (ref 8.9–10.3)
Chloride: 99 mmol/L (ref 98–111)
Creatinine, Ser: 0.51 mg/dL (ref 0.44–1.00)
GFR, Estimated: >60 mL/min (ref >60)
Glucose, Bld: 262 mg/dL — ABNORMAL HIGH (ref 70–99)
Potassium: 3 mmol/L — ABNORMAL LOW (ref 3.5–5.1)
Sodium: 137 mmol/L (ref 135–145)
Total Bilirubin: 0.5 mg/dL (ref 0.3–1.2)
Total Protein: 6.4 g/dL — ABNORMAL LOW (ref 6.5–8.1)

## 2021-09-05 LAB — RESP PANEL BY RT-PCR (FLU A&B, COVID) ARPGX2
Influenza A by PCR: POSITIVE — AB
Influenza B by PCR: NEGATIVE
SARS Coronavirus 2 by RT PCR: NEGATIVE

## 2021-09-05 MED ORDER — POTASSIUM CHLORIDE CRYS ER 20 MEQ PO TBCR
40.0000 meq | EXTENDED_RELEASE_TABLET | Freq: Once | ORAL | Status: AC
  Administered 2021-09-05: 40 meq via ORAL
  Filled 2021-09-05: qty 2

## 2021-09-05 MED ORDER — SODIUM CHLORIDE 0.9 % IV SOLN
1.0000 g | Freq: Once | INTRAVENOUS | Status: AC
  Administered 2021-09-05: 1 g via INTRAVENOUS
  Filled 2021-09-05: qty 10

## 2021-09-05 MED ORDER — OXYCODONE-ACETAMINOPHEN 5-325 MG PO TABS
1.0000 | ORAL_TABLET | Freq: Once | ORAL | Status: AC
  Administered 2021-09-05: 1 via ORAL
  Filled 2021-09-05: qty 1

## 2021-09-05 MED ORDER — OSELTAMIVIR PHOSPHATE 75 MG PO CAPS: 75.0000 mg | ORAL_CAPSULE | Freq: Two times a day (BID) | ORAL | 0 refills | Status: DC

## 2021-09-05 MED ORDER — ACETAMINOPHEN 325 MG PO TABS
650.0000 mg | ORAL_TABLET | Freq: Once | ORAL | Status: AC
  Administered 2021-09-05: 650 mg via ORAL
  Filled 2021-09-05: qty 2

## 2021-09-05 NOTE — ED Notes (Signed)
Assisting primary RN - Patient called to ask for more pain medication -Per PA - will hold off at this time.

## 2021-09-05 NOTE — ED Triage Notes (Signed)
Patient reports to the ER for flank pain. Patient reports she was diagnosed with a kidney infection x2 days ago and it has gotten worse. Also reports she has cough and cold symptoms.

## 2021-09-05 NOTE — ED Notes (Signed)
Patient verbalizes understanding of discharge instructions. Opportunity for questioning and answers were provided. Patient discharged from ED.  °

## 2021-09-05 NOTE — ED Provider Notes (Signed)
[facility] EMERGENCY DEPT Provider Note   CSN: [id number] Arrival date & time: 09/05/21  1118     History Chief Complaint  Patient presents with   Flank Pain    Jodi Mcgee is a 35 y.o. female.  HPI 35 year old female past medical history significant for diabetes who presents to the emergency department today with family for evaluation of fevers, low back pain, urinary symptoms.  Patient was seen 2 days ago and diagnosed with a urine infection.  Patient reports that she has not picked up her antibiotics and her back pain including bilateral flank pain has worsened.  She reports ongoing urinary urgency or frequency.  She also reports fevers, vomiting and diarrhea.  Family at home has similar symptoms.  Patient and family has all tested positive for influenza today.  Patient has taken no medications for her pain she states that she has not picked up her Percocet from the pharmacy as well.  Patient does have a history of pyelonephritis.    Past Medical History:  Diagnosis Date   Anemia    Anxiety    Blood transfusion without reported diagnosis    both CS   Cocaine abuse ([place])    Diabetes mellitus without complication ([place])    GERD (gastroesophageal reflux disease)    has resolved   IBS (irritable bowel syndrome)    Ovarian cyst    Peritonitis, acute generalized ([place])    Pulmonary embolism (HCC)    SBO (small bowel obstruction) ([place])    Sepsis ([place])     Patient Active Problem List   Diagnosis Date Noted   Hypokalemia 03/22/2021   GERD (gastroesophageal reflux disease) 03/22/2021   Pyelonephritis 03/21/2021   Controlled type 2 diabetes mellitus with hyperglycemia (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   Obesity (BMI 30-39.9) 09/25/2017   Type 2 diabetes mellitus, without long-term current use of insulin ([place]) 08/23/2017   Chronic pain syndrome 08/23/2017    Past Surgical History:  Procedure Laterality Date   APPENDECTOMY     ruptured    BUNIONECTOMY     CESAREAN SECTION     CESAREAN SECTION Bilateral 11/20/2017   Procedure: CESAREAN SECTION;  Surgeon: Sloan Leiter, MD;  Location: [facility];  Service: Obstetrics;  Laterality: Bilateral;   OVARIAN CYST DRAINAGE     SMALL BOWEL REPAIR       OB History     Gravida  3   Para  3   Term  1   Preterm  2   AB  0   Living  3      SAB  0   IAB  0   Ectopic  0   Multiple  0   Live Births  3           Family History  Problem Relation Age of Onset   Hypertension Mother    Anemia Mother    Diabetes Father     Social History   Tobacco Use   Smoking status: Former  Packs/day: 0.50    Types: Cigarettes   Smokeless tobacco: Never   Tobacco comments:    social use with tobacco  Vaping Use   Vaping Use: Former  Substance Use Topics   Alcohol use: Yes    Comment: occ   Drug use: Not Currently    Home Medications Prior to Admission medications   Medication Sig Start Date End Date Taking? Authorizing Provider  oseltamivir (TAMIFLU) 75 MG capsule Take 1 capsule (75 mg total) by mouth every 12 (twelve) hours. 09/05/21  Yes Ocie Cornfield T, PA-C  blood glucose meter kit and supplies KIT Dispense based on patient and insurance preference. Use up to four times daily as directed. (FOR ICD-9 250.00, 250.01). 02/21/19   Oretha Milch D, MD  cephALEXin (KEFLEX) 500 MG capsule Take 1 capsule (500 mg total) by mouth 4 (four) times daily. 09/04/21   Mesner,  Corene Cornea, MD  HUMULIN R 100 UNIT/ML injection Inject 20 Units into the skin 3 (three) times daily before meals. Per sliding scale 03/24/20   [provider]  hyoscyamine (LEVSIN SL) 0.125 MG SL tablet Place 1 tablet (0.125 mg total) under the tongue every 4 (four) hours as needed for cramping (abdominal pain). 04/15/21   Orpah Greek, MD  insulin detemir (LEVEMIR) 100 unit/ml SOLN Inject 20 Units into the skin 3 (three) times daily. 03/02/21   Matilde Haymaker, MD  Levonorgestrel (LILETTA) 19.5 MCG/DAY IUD IUD 1 each by Intrauterine route continuous. Placed in 2019    [provider]  loperamide (IMODIUM) 2 MG capsule Take 1 capsule (2 mg total) by mouth 4 (four) times daily as needed for diarrhea or loose stools. 04/08/21   Tedd Sias, PA  ondansetron (ZOFRAN ODT) 4 MG disintegrating tablet 70m ODT q4 hours prn nausea/vomit 09/04/21   Mesner, JCorene Cornea MD  oxyCODONE-acetaminophen (PERCOCET) 5-325 MG tablet Take 1 tablet by mouth every 8 (eight) hours as needed for severe pain. 09/04/21   Mesner, JCorene Cornea MD  polyethylene glycol (MIRALAX / GLYCOLAX) 17 g packet Take 17 g by mouth daily. 03/26/21   Arrien, MJimmy Picket MD  potassium chloride (KLOR-CON) 10 MEQ tablet Take 1 tablet (10 mEq total) by mouth daily. 04/08/21   FTedd Sias PA  potassium chloride (KLOR-CON) 10 MEQ tablet Take 1 tablet (10 mEq total) by mouth 2 (two) times daily. 04/25/21   Palumbo, April, MD  tizanidine (ZANAFLEX) 2 MG capsule Take 1 capsule (2 mg total) by mouth 3 (three) times daily as needed for up to 30 doses for muscle spasms. 08/13/21   Curatolo, Adam, DO    Allergies    Hydrocodone, Iodinated diagnostic agents, Cetirizine & related, Toradol [ketorolac tromethamine], Flagyl [metronidazole], and Nsaids  Review of Systems   Review of Systems  Constitutional:  Positive for chills and fever. Negative for activity change and appetite change.  HENT:  Positive for congestion, rhinorrhea and sore  throat.   Respiratory:  Positive for cough.   Cardiovascular:  Negative for chest pain.  Gastrointestinal:  Positive for abdominal pain, diarrhea, nausea and vomiting.  Genitourinary:  Positive for flank pain, frequency and urgency. Negative for dysuria.  Musculoskeletal:  Positive for arthralgias, back pain and myalgias.  Skin:  Negative for rash.  Neurological:  Negative for weakness and numbness.  Psychiatric/Behavioral:  Negative for confusion.    Physical Exam Updated Vital Signs BP (!) 182/120 (BP Location: Left Arm)   Pulse 93   Temp 99.6 F (37.6 C) (Oral)   Resp 16  SpO2 98%   Physical Exam Vitals and nursing note reviewed.  Constitutional:      General: She is not in acute distress.    Appearance: She is well-developed. She is not ill-appearing or toxic-appearing.  HENT:     Head: Normocephalic and atraumatic.     Right Ear: Tympanic membrane, ear canal and external ear normal.     Left Ear: Tympanic membrane, ear canal and external ear normal.     Nose: Congestion and rhinorrhea present.     Mouth/Throat:     Mouth: Mucous membranes are moist.     Pharynx: Oropharynx is clear.  Eyes:     General: No scleral icterus.       Right eye: No discharge.        Left eye: No discharge.  Cardiovascular:     Rate and Rhythm: Normal rate and regular rhythm.     Pulses: Normal pulses.     Heart sounds: Normal heart sounds.  Pulmonary:     Effort: Pulmonary effort is normal. No respiratory distress.     Breath sounds: Normal breath sounds.  Abdominal:     General: Abdomen is flat. Bowel sounds are normal. There is no distension.     Palpations: Abdomen is soft. There is no mass.     Tenderness: There is no abdominal tenderness. There is right CVA tenderness and left CVA tenderness. There is no guarding or rebound.     Hernia: No hernia is present.  Musculoskeletal:        General: Normal range of motion.     Cervical back: Normal range of motion.  Skin:    General:  Skin is warm and dry.     Capillary Refill: Capillary refill takes less than 2 seconds.     Coloration: Skin is not pale.  Neurological:     Mental Status: She is alert.  Psychiatric:        Behavior: Behavior normal.        Thought Content: Thought content normal.        Judgment: Judgment normal.    ED Results / Procedures / Treatments   Labs (all labs ordered are listed, but only abnormal results are displayed) Labs Reviewed  RESP PANEL BY RT-PCR (FLU A&B, COVID) ARPGX2 - Abnormal; Notable for the following components:      Result Value   Influenza A by PCR POSITIVE (*)    All other components within normal limits  CBC WITH DIFFERENTIAL/PLATELET - Abnormal; Notable for the following components:   WBC 3.3 (*)    Hemoglobin 11.3 (*)    HCT 34.5 (*)    All other components within normal limits  COMPREHENSIVE METABOLIC PANEL - Abnormal; Notable for the following components:   Potassium 3.0 (*)    Glucose, Bld 262 (*)    BUN <5 (*)    Calcium 8.6 (*)    Total Protein 6.4 (*)    Albumin 3.2 (*)    AST 9 (*)    All other components within normal limits  URINALYSIS, ROUTINE W REFLEX MICROSCOPIC    EKG None  Radiology CT Renal Stone Study  Result Date: 09/03/2021 CLINICAL DATA:  Bilateral flank and back pain. EXAM: CT ABDOMEN AND PELVIS WITHOUT CONTRAST TECHNIQUE: Multidetector CT imaging of the abdomen and pelvis was performed following the standard protocol without IV contrast. COMPARISON:  Contrast-enhanced CT 04/08/2021 FINDINGS: Lower chest: No acute airspace disease or pleural effusion. Heart is normal in size. Hepatobiliary: No focal  liver lesion on this unenhanced exam. Gallbladder physiologically distended, no calcified stone. No biliary dilatation. Pancreas: Unremarkable. No pancreatic ductal dilatation or surrounding inflammatory changes. Spleen: Normal in size without focal abnormality. Adrenals/Urinary Tract: No adrenal nodule. No hydronephrosis or renal calculi. No  perinephric edema. No evidence of focal renal abnormality on this unenhanced exam. Partially distended urinary bladder which is unremarkable. Stomach/Bowel: Ingested material within the stomach. Small bowel obstruction or inflammation. Appendectomy per history. Small volume of colonic stool without colonic inflammation. Vascular/Lymphatic: Normal caliber abdominal aorta no portal venous or mesenteric gas. There is no bulky abdominopelvic adenopathy. Reproductive: IUD within the uterus. Anterior positioning of the uterus consistent with prior Caesarean section. No adnexal mass. Other: No free air or free fluid.  No abdominal wall hernia. Musculoskeletal: There are no acute or suspicious osseous abnormalities. No significant degenerative change in the spine to explain back pain. Normal appearance of paraspinal musculature. IMPRESSION: No renal stones or obstructive uropathy. No acute abnormality or explanation for flank pain. Electronically Signed   By: Keith Rake M.D.   On: 09/03/2021 21:19    Procedures Procedures   Medications Ordered in ED Medications  potassium chloride SA (KLOR-CON) CR tablet 40 mEq (has no administration in time range)  acetaminophen (TYLENOL) tablet 650 mg (has no administration in time range)  oxyCODONE-acetaminophen (PERCOCET/ROXICET) 5-325 MG per tablet 1 tablet (1 tablet Oral Given 09/05/21 1345)  cefTRIAXone (ROCEPHIN) 1 g in sodium chloride 0.9 % 100 mL IVPB (0 g Intravenous Stopped 09/05/21 1451)    ED Course  I have reviewed the triage vital signs and the nursing notes.  Pertinent labs & imaging results that were available during my care of the patient were reviewed by me and considered in my medical decision making (see chart for details).    MDM Rules/Calculators/A&P                           35 year old presents for ongoing bilateral flank and back pains with associated urinary symptoms, nausea and vomiting, cough, nasal congestion and fevers.  Family  has tested positive for influenza A today.  Patient is positive for influenza A.  Patient was diagnosed with UTI 2 days ago.  Patient has not picked up her antibiotics.  She was given Rocephin 2 days ago.  Urine culture does show growth but sensitivities are pending at this time.  Patient with low-grade fever in the ER.  Blood work shows leukopenia consistent with viral infection.  Normal kidney function.  Glucose elevated but no signs of DKA and consistent with patient's history of diabetes.  Hypokalemia noted with history of same patient provided oral potassium in the ER.  Patient given Percocet.  She was requesting for the pain medication although has an allergy to NSAIDs and Toradol.  Will not give additional narcotic pain medication as she does have prescription at the pharmacy.  Patient continuing Tylenol at home for her fevers.  Patient is not systemically ill today.  Patient had CT scan 2 days ago that showed no obstructive pathology.  Discussed primary care follow-up and reasons to return to the ER.  We will start patient on Tamiflu given patient's history of diabetes.  Pt is hemodynamically stable, in NAD, & able to ambulate in the ED. Evaluation does not show pathology that would require ongoing emergent intervention or inpatient treatment. I explained the diagnosis to the patient. Pain has been managed & has no complaints prior to dc. Pt is  comfortable with above plan and is stable for discharge at this time. All questions were answered prior to disposition. Strict return precautions for f/u to the ED were discussed. Encouraged follow up with PCP.  Final Clinical Impression(s) / ED Diagnoses Final diagnoses:  Influenza A  Pyelonephritis    Rx / DC Orders ED Discharge Orders          Ordered    oseltamivir (TAMIFLU) 75 MG capsule  Every 12 hours        09/05/21 1527             Doristine Devoid, PA-C 09/05/21 [place], DO 09/06/21 1512

## 2021-09-06 LAB — URINE CULTURE: Culture: 20000 — AB

## 2021-09-07 ENCOUNTER — Telehealth: Payer: Self-pay | Admitting: *Deleted

## 2021-09-07 NOTE — Telephone Encounter (Signed)
Post ED Visit - Positive Culture Follow-up  Culture report reviewed by antimicrobial stewardship pharmacist: [facility] Team []  Elenor Quinones, Pharm.D. []  Heide Guile, Pharm.D., BCPS AQ-ID []  Parks Neptune, Pharm.D., BCPS []  Alycia Rossetti, Pharm.D., BCPS []  [place], Pharm.D., BCPS, AAHIVP []  Legrand Como, Pharm.D., BCPS, AAHIVP []  Salome Arnt, PharmD, BCPS []  Johnnette Gourd, PharmD, BCPS []  Hughes Better, PharmD, BCPS []  Leeroy Cha, PharmD []  Laqueta Linden, PharmD, BCPS []  Albertina Parr, PharmD  [place] Team []  Leodis Sias, PharmD []  Lindell Spar, PharmD []  Royetta Asal, PharmD []  Graylin Shiver, Rph []  Rema Fendt) Glennon Mac, PharmD []  Arlyn Dunning, PharmD []  Netta Cedars, PharmD []  Dia Sitter, PharmD []  Leone Haven, PharmD []  Gretta Arab, PharmD []  Theodis Shove, PharmD []  Peggyann Juba, PharmD []  Reuel Boom, PharmD   Positive urine culture Treated with CEphalexin, organism sensitive to the same and no further patient follow-up is required at this time.  Cheron Every, PharmD  Harlon Flor Talley 09/07/2021, 10:01 AM

## 2021-09-17 ENCOUNTER — Emergency Department (HOSPITAL_BASED_OUTPATIENT_CLINIC_OR_DEPARTMENT_OTHER)
Admission: EM | Admit: 2021-09-17 | Discharge: 2021-09-17 | Disposition: A | Discharge status: Home / Self Care | Payer: Medicaid Other | Attending: Emergency Medicine | Admitting: Emergency Medicine

## 2021-09-17 ENCOUNTER — Other Ambulatory Visit: Payer: Self-pay

## 2021-09-17 ENCOUNTER — Encounter (HOSPITAL_BASED_OUTPATIENT_CLINIC_OR_DEPARTMENT_OTHER): Payer: Self-pay | Admitting: Emergency Medicine

## 2021-09-17 DIAGNOSIS — E1110 Type 2 diabetes mellitus with ketoacidosis without coma: Secondary | ICD-10-CM | POA: Insufficient documentation

## 2021-09-17 DIAGNOSIS — K219 Gastro-esophageal reflux disease without esophagitis: Secondary | ICD-10-CM | POA: Insufficient documentation

## 2021-09-17 DIAGNOSIS — Z794 Long term (current) use of insulin: Secondary | ICD-10-CM | POA: Diagnosis not present

## 2021-09-17 DIAGNOSIS — R109 Unspecified abdominal pain: Secondary | ICD-10-CM | POA: Diagnosis present

## 2021-09-17 DIAGNOSIS — R197 Diarrhea, unspecified: Secondary | ICD-10-CM | POA: Diagnosis not present

## 2021-09-17 DIAGNOSIS — Z87891 Personal history of nicotine dependence: Secondary | ICD-10-CM | POA: Diagnosis not present

## 2021-09-17 LAB — URINALYSIS, MICROSCOPIC (REFLEX)

## 2021-09-17 LAB — CBC WITH DIFFERENTIAL/PLATELET
Abs Immature Granulocytes: 0.02 10*3/uL (ref 0.00–0.07)
Basophils Absolute: 0 10*3/uL (ref 0.0–0.1)
Basophils Relative: 0 %
Eosinophils Absolute: 0.1 10*3/uL (ref 0.0–0.5)
Eosinophils Relative: 2 %
HCT: 32.3 % — ABNORMAL LOW (ref 36.0–46.0)
Hemoglobin: 10.8 g/dL — ABNORMAL LOW (ref 12.0–15.0)
Immature Granulocytes: 0 %
Lymphocytes Relative: 28 %
Lymphs Abs: 1.4 10*3/uL (ref 0.7–4.0)
MCH: 27.1 pg (ref 26.0–34.0)
MCHC: 33.4 g/dL (ref 30.0–36.0)
MCV: 81.2 fL (ref 80.0–100.0)
Monocytes Absolute: 0.3 10*3/uL (ref 0.1–1.0)
Monocytes Relative: 6 %
Neutro Abs: 3.2 10*3/uL (ref 1.7–7.7)
Neutrophils Relative %: 64 %
Platelets: 303 10*3/uL (ref 150–400)
RBC: 3.98 MIL/uL (ref 3.87–5.11)
RDW: 12.9 % (ref 11.5–15.5)
WBC: 5 10*3/uL (ref 4.0–10.5)
nRBC: 0 % (ref 0.0–0.2)

## 2021-09-17 LAB — URINALYSIS, ROUTINE W REFLEX MICROSCOPIC
Bilirubin Urine: NEGATIVE
Glucose, UA: >=500 mg/dL — AB
Hgb urine dipstick: NEGATIVE
Ketones, ur: NEGATIVE mg/dL
Leukocytes,Ua: NEGATIVE
Nitrite: NEGATIVE
Protein, ur: NEGATIVE mg/dL
Specific Gravity, Urine: 1.02 (ref 1.005–1.030)
pH: 7 (ref 5.0–8.0)

## 2021-09-17 LAB — COMPREHENSIVE METABOLIC PANEL
ALT: 11 U/L (ref 0–44)
AST: 16 U/L (ref 15–41)
Albumin: 2.9 g/dL — ABNORMAL LOW (ref 3.5–5.0)
Alkaline Phosphatase: 49 U/L (ref 38–126)
Anion gap: 7 (ref 5–15)
BUN: 9 mg/dL (ref 6–20)
CO2: 27 mmol/L (ref 22–32)
Calcium: 8.1 mg/dL — ABNORMAL LOW (ref 8.9–10.3)
Chloride: 98 mmol/L (ref 98–111)
Creatinine, Ser: 0.72 mg/dL (ref 0.44–1.00)
GFR, Estimated: >60 mL/min (ref >60)
Glucose, Bld: 359 mg/dL — ABNORMAL HIGH (ref 70–99)
Potassium: 2.7 mmol/L — CL (ref 3.5–5.1)
Sodium: 132 mmol/L — ABNORMAL LOW (ref 135–145)
Total Bilirubin: 0.1 mg/dL — ABNORMAL LOW (ref 0.3–1.2)
Total Protein: 6.3 g/dL — ABNORMAL LOW (ref 6.5–8.1)

## 2021-09-17 LAB — PREGNANCY, URINE: Preg Test, Ur: NEGATIVE

## 2021-09-17 LAB — LIPASE, BLOOD: Lipase: 32 U/L (ref 11–51)

## 2021-09-17 MED ORDER — SODIUM CHLORIDE 0.9 % IV BOLUS
1000.0000 mL | Freq: Once | INTRAVENOUS | Status: AC
  Administered 2021-09-17: 1000 mL via INTRAVENOUS

## 2021-09-17 MED ORDER — ONDANSETRON HCL 4 MG/2ML IJ SOLN
4.0000 mg | Freq: Once | INTRAMUSCULAR | Status: AC
  Administered 2021-09-17: 4 mg via INTRAVENOUS
  Filled 2021-09-17: qty 2

## 2021-09-17 MED ORDER — MORPHINE SULFATE (PF) 4 MG/ML IV SOLN
4.0000 mg | Freq: Once | INTRAVENOUS | Status: AC
  Administered 2021-09-17: 4 mg via INTRAVENOUS
  Filled 2021-09-17: qty 1

## 2021-09-17 MED ORDER — CYCLOBENZAPRINE HCL 10 MG PO TABS
10.0000 mg | ORAL_TABLET | Freq: Once | ORAL | Status: AC
  Administered 2021-09-17: 10 mg via ORAL
  Filled 2021-09-17: qty 1

## 2021-09-17 MED ORDER — CYCLOBENZAPRINE HCL 10 MG PO TABS: 10.0000 mg | ORAL_TABLET | Freq: Three times a day (TID) | ORAL | 0 refills | Status: DC | PRN

## 2021-09-17 MED ORDER — POTASSIUM CHLORIDE CRYS ER 20 MEQ PO TBCR
40.0000 meq | EXTENDED_RELEASE_TABLET | Freq: Once | ORAL | Status: AC
  Administered 2021-09-17: 40 meq via ORAL
  Filled 2021-09-17: qty 2

## 2021-09-17 NOTE — Discharge Instructions (Signed)
Begin taking Flexeril as prescribed.  Follow-up with your primary doctor in the next week if not improving.

## 2021-09-17 NOTE — ED Provider Notes (Signed)
[facility] EMERGENCY DEPARTMENT Provider Note   CSN: [id number] Arrival date & time: 09/17/21  0118     History Chief Complaint  Patient presents with   Abdominal Pain    Jodi Mcgee is a 35 y.o. female.  Patient is a 35 year old female with history of chronic abdominal pain, diabetes, anemia, GERD, irritable bowel, prior pulmonary embolism.  Patient presenting today with complaints of abdominal pain.  She describes pain to her left abdomen and left flank that has been ongoing for the past week.  She describes recurrent diarrhea that is nonbloody and nonmelanotic.  She denies to me that she is having any fevers.  Patient was seen here 2 weeks ago with similar complaints and underwent work-up that was basically unremarkable.  She was treated for a urinary tract infection.  She filled a prescription which she tells me caused her to itch and have a rash and was told to stop taking it.  The history is provided by the patient.  Abdominal Pain Pain location:  L flank Pain quality: throbbing   Pain radiates to:  LLQ Pain severity:  Moderate Onset quality:  Sudden Duration:  1 week Timing:  Constant Progression:  Worsening Chronicity:  New Relieved by:  Nothing Worsened by:  Nothing     Past Medical History:  Diagnosis Date   Anemia    Anxiety    Blood transfusion without reported diagnosis    both CS   Cocaine abuse ([place])    Diabetes mellitus without complication ([place])    GERD (gastroesophageal reflux disease)    has resolved   IBS (irritable bowel syndrome)    Ovarian cyst    Peritonitis, acute generalized ([place])    Pulmonary embolism (HCC)    SBO (small bowel obstruction) ([place])    Sepsis ([place])     Patient Active Problem List   Diagnosis Date Noted   Hypokalemia 03/22/2021   GERD (gastroesophageal reflux disease) 03/22/2021   Pyelonephritis 03/21/2021   Controlled type 2 diabetes mellitus with hyperglycemia ([place]) 05/10/2020   Partial small bowel  obstruction ([place]) 05/09/2020   Bartholin's gland abscess 02/21/2019   DKA (diabetic ketoacidoses) 02/20/2019   Blood per rectum 02/20/2019   Hyponatremia 09/26/2018   Chronic hypertension 01/03/2018   IUD contraception 01/03/2018   Pulmonary embolism during puerperium 12/04/2017   Pulmonary edema 11/30/2017   Pelvic adhesive disease 10/22/2017   Nasal septal perforation 10/17/2017   History of cocaine abuse ([place]) 10/17/2017   Epistaxis 10/17/2017   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   Obesity (BMI 30-39.9) 09/25/2017   Type 2 diabetes mellitus, without long-term current use of insulin ([place]) 08/23/2017   Chronic pain syndrome 08/23/2017    Past Surgical History:  Procedure Laterality Date   APPENDECTOMY     ruptured    BUNIONECTOMY     CESAREAN SECTION     CESAREAN SECTION Bilateral 11/20/2017   Procedure: CESAREAN SECTION;  Surgeon: Sloan Leiter, MD;  Location: [facility];  Service: Obstetrics;  Laterality: Bilateral;   OVARIAN CYST DRAINAGE     SMALL BOWEL REPAIR       OB History     Gravida  3   Para  3   Term  1   Preterm  2   AB  0   Living  3      SAB  0   IAB  0   Ectopic  0   Multiple  0   Live Births  3  Family History  Problem Relation Age of Onset   Hypertension Mother    Anemia Mother    Diabetes Father     Social History   Tobacco Use   Smoking status: Former    Packs/day: 0.50    Types: Cigarettes   Smokeless tobacco: Never   Tobacco comments:    social use with tobacco  Vaping Use   Vaping Use: Former  Substance Use Topics   Alcohol use: Yes    Comment: occ   Drug use: Not Currently    Home Medications Prior to Admission medications   Medication Sig Start Date End Date Taking? Authorizing Provider  blood glucose meter kit and supplies KIT Dispense based on patient and insurance preference. Use up to four times daily as directed. (FOR ICD-9 250.00, 250.01). 02/21/19   Oretha Milch D, MD   cephALEXin (KEFLEX) 500 MG capsule Take 1 capsule (500 mg total) by mouth 4 (four) times daily. 09/04/21   Mesner, Corene Cornea, MD  HUMULIN R 100 UNIT/ML injection Inject 20 Units into the skin 3 (three) times daily before meals. Per sliding scale 03/24/20   [provider]  hyoscyamine (LEVSIN SL) 0.125 MG SL tablet Place 1 tablet (0.125 mg total) under the tongue every 4 (four) hours as needed for cramping (abdominal pain). 04/15/21   Orpah Greek, MD  insulin detemir (LEVEMIR) 100 unit/ml SOLN Inject 20 Units into the skin 3 (three) times daily. 03/02/21   Matilde Haymaker, MD  Levonorgestrel (LILETTA) 19.5 MCG/DAY IUD IUD 1 each by Intrauterine route continuous. Placed in 2019    [provider]  loperamide (IMODIUM) 2 MG capsule Take 1 capsule (2 mg total) by mouth 4 (four) times daily as needed for diarrhea or loose stools. 04/08/21   Tedd Sias, PA  ondansetron (ZOFRAN ODT) 4 MG disintegrating tablet 3m ODT q4 hours prn nausea/vomit 09/04/21   Mesner, JCorene Cornea MD  oseltamivir (TAMIFLU) 75 MG capsule Take 1 capsule (75 mg total) by mouth every 12 (twelve) hours. 09/05/21   LDoristine Devoid PA-C  oxyCODONE-acetaminophen (PERCOCET) 5-325 MG tablet Take 1 tablet by mouth every 8 (eight) hours as needed for severe pain. 09/04/21   Mesner, JCorene Cornea MD  polyethylene glycol (MIRALAX / GLYCOLAX) 17 g packet Take 17 g by mouth daily. 03/26/21   Arrien, MJimmy Picket MD  potassium chloride (KLOR-CON) 10 MEQ tablet Take 1 tablet (10 mEq total) by mouth daily. 04/08/21   FTedd Sias PA  potassium chloride (KLOR-CON) 10 MEQ tablet Take 1 tablet (10 mEq total) by mouth 2 (two) times daily. 04/25/21   Palumbo, April, MD  tizanidine (ZANAFLEX) 2 MG capsule Take 1 capsule (2 mg total) by mouth 3 (three) times daily as needed for up to 30 doses for muscle spasms. 08/13/21   Curatolo, Adam, DO    Allergies    Hydrocodone, Iodinated diagnostic agents, Cetirizine & related, Toradol  [ketorolac tromethamine], Flagyl [metronidazole], and Nsaids  Review of Systems   Review of Systems  Gastrointestinal:  Positive for abdominal pain.  All other systems reviewed and are negative.  Physical Exam Updated Vital Signs BP (!) 156/97 (BP Location: Left Arm)   Pulse (!) 112   Temp 98.6 F (37 C) (Oral)   Resp 20   Ht _0  (1.702 m)   Wt 63.5 kg   SpO2 100%   BMI 21.93 kg/m   Physical Exam Vitals and nursing note reviewed.  Constitutional:      General: She is not  in acute distress.    Appearance: She is well-developed. She is not diaphoretic.  HENT:     Head: Normocephalic and atraumatic.  Cardiovascular:     Rate and Rhythm: Normal rate and regular rhythm.     Heart sounds: No murmur heard.   No friction rub. No gallop.  Pulmonary:     Effort: Pulmonary effort is normal. No respiratory distress.     Breath sounds: Normal breath sounds. No wheezing.  Abdominal:     General: Bowel sounds are normal. There is no distension.     Palpations: Abdomen is soft.     Tenderness: There is abdominal tenderness in the left lower quadrant. There is left CVA tenderness. There is no right CVA tenderness, guarding or rebound.  Musculoskeletal:        General: Normal range of motion.     Cervical back: Normal range of motion and neck supple.  Skin:    General: Skin is warm and dry.  Neurological:     General: No focal deficit present.     Mental Status: She is alert and oriented to person, place, and time.    ED Results / Procedures / Treatments   Labs (all labs ordered are listed, but only abnormal results are displayed) Labs Reviewed  COMPREHENSIVE METABOLIC PANEL  LIPASE, BLOOD  CBC WITH DIFFERENTIAL/PLATELET    EKG None  Radiology No results found.  Procedures Procedures   Medications Ordered in ED Medications  ondansetron (ZOFRAN) injection 4 mg (has no administration in time range)  sodium chloride 0.9 % bolus 1,000 mL (has no administration in time  range)  morphine 4 MG/ML injection 4 mg (has no administration in time range)    ED Course  I have reviewed the triage vital signs and the nursing notes.  Pertinent labs & imaging results that were available during my care of the patient were reviewed by me and considered in my medical decision making (see chart for details).    MDM Rules/Calculators/A&P  Patient presenting with complaints of left flank pain that started earlier this evening.  Patient seen 2 weeks ago with similar complaints and was diagnosed with urinary tract infection.  Patient complaining of stabbing pains to the left flank, the etiology of which I am uncertain.  Her work-up here is unremarkable.  She has no white count, normal electrolytes with the exception of a potassium of 2.7, and urinalysis which is clear.  CT scan visit 2 weeks ago showed no kidney stones.  Patient given normal saline along with medicine for her pain.  At this point, I see no indication for imaging studies.  Upon reviewing her record, she has had nearly 30 CAT scans in the past 5 years.  Patient to be discharged with Flexeril for what I believed to be musculoskeletal pain and follow-up with primary doctor.  She tells me she has seen a gastroenterologist at an outside facility recently, however her colonoscopy was inconclusive due to incomplete bowel prep.  Final Clinical Impression(s) / ED Diagnoses Final diagnoses:  None    Rx / DC Orders ED Discharge Orders     None        Veryl Speak, MD 09/17/21 [phone]

## 2021-09-17 NOTE — ED Notes (Signed)
Pain is still severe, Morphine did not help much.

## 2021-09-17 NOTE — ED Triage Notes (Signed)
Pt state left back central to buttocks. Was seen for same and treated for kidney infection now has abd pain with NVD. X 1week

## 2021-09-19 ENCOUNTER — Emergency Department (HOSPITAL_BASED_OUTPATIENT_CLINIC_OR_DEPARTMENT_OTHER)
Admission: EM | Admit: 2021-09-19 | Discharge: 2021-09-19 | Disposition: A | Discharge status: Home / Self Care | Payer: Medicaid Other | Attending: Emergency Medicine | Admitting: Emergency Medicine

## 2021-09-19 ENCOUNTER — Emergency Department (HOSPITAL_BASED_OUTPATIENT_CLINIC_OR_DEPARTMENT_OTHER): Payer: Medicaid Other

## 2021-09-19 ENCOUNTER — Other Ambulatory Visit: Payer: Self-pay

## 2021-09-19 ENCOUNTER — Encounter (HOSPITAL_BASED_OUTPATIENT_CLINIC_OR_DEPARTMENT_OTHER): Payer: Self-pay | Admitting: *Deleted

## 2021-09-19 DIAGNOSIS — Z87891 Personal history of nicotine dependence: Secondary | ICD-10-CM | POA: Insufficient documentation

## 2021-09-19 DIAGNOSIS — Z794 Long term (current) use of insulin: Secondary | ICD-10-CM | POA: Insufficient documentation

## 2021-09-19 DIAGNOSIS — K529 Noninfective gastroenteritis and colitis, unspecified: Secondary | ICD-10-CM | POA: Insufficient documentation

## 2021-09-19 DIAGNOSIS — K219 Gastro-esophageal reflux disease without esophagitis: Secondary | ICD-10-CM | POA: Diagnosis not present

## 2021-09-19 DIAGNOSIS — E119 Type 2 diabetes mellitus without complications: Secondary | ICD-10-CM | POA: Diagnosis not present

## 2021-09-19 DIAGNOSIS — Z86018 Personal history of other benign neoplasm: Secondary | ICD-10-CM | POA: Diagnosis not present

## 2021-09-19 DIAGNOSIS — Z7984 Long term (current) use of oral hypoglycemic drugs: Secondary | ICD-10-CM | POA: Insufficient documentation

## 2021-09-19 DIAGNOSIS — Z20822 Contact with and (suspected) exposure to covid-19: Secondary | ICD-10-CM | POA: Diagnosis not present

## 2021-09-19 DIAGNOSIS — R109 Unspecified abdominal pain: Secondary | ICD-10-CM | POA: Diagnosis present

## 2021-09-19 LAB — COMPREHENSIVE METABOLIC PANEL
ALT: 10 U/L (ref 0–44)
AST: 11 U/L — ABNORMAL LOW (ref 15–41)
Albumin: 3.8 g/dL (ref 3.5–5.0)
Alkaline Phosphatase: 57 U/L (ref 38–126)
Anion gap: 11 (ref 5–15)
BUN: 6 mg/dL (ref 6–20)
CO2: 28 mmol/L (ref 22–32)
Calcium: 9.5 mg/dL (ref 8.9–10.3)
Chloride: 97 mmol/L — ABNORMAL LOW (ref 98–111)
Creatinine, Ser: 0.53 mg/dL (ref 0.44–1.00)
GFR, Estimated: >60 mL/min (ref >60)
Glucose, Bld: 304 mg/dL — ABNORMAL HIGH (ref 70–99)
Potassium: 3.2 mmol/L — ABNORMAL LOW (ref 3.5–5.1)
Sodium: 136 mmol/L (ref 135–145)
Total Bilirubin: 0.5 mg/dL (ref 0.3–1.2)
Total Protein: 7.2 g/dL (ref 6.5–8.1)

## 2021-09-19 LAB — URINALYSIS, ROUTINE W REFLEX MICROSCOPIC
Bilirubin Urine: NEGATIVE
Glucose, UA: 500 mg/dL — AB
Hgb urine dipstick: NEGATIVE
Ketones, ur: NEGATIVE mg/dL
Leukocytes,Ua: NEGATIVE
Nitrite: NEGATIVE
Protein, ur: NEGATIVE mg/dL
Specific Gravity, Urine: 1.01 (ref 1.005–1.030)
pH: 7 (ref 5.0–8.0)

## 2021-09-19 LAB — CBC WITH DIFFERENTIAL/PLATELET
Abs Immature Granulocytes: 0.02 10*3/uL (ref 0.00–0.07)
Basophils Absolute: 0 10*3/uL (ref 0.0–0.1)
Basophils Relative: 1 %
Eosinophils Absolute: 0.1 10*3/uL (ref 0.0–0.5)
Eosinophils Relative: 1 %
HCT: 38.6 % (ref 36.0–46.0)
Hemoglobin: 12.8 g/dL (ref 12.0–15.0)
Immature Granulocytes: 0 %
Lymphocytes Relative: 17 %
Lymphs Abs: 1 10*3/uL (ref 0.7–4.0)
MCH: 27.2 pg (ref 26.0–34.0)
MCHC: 33.2 g/dL (ref 30.0–36.0)
MCV: 82.1 fL (ref 80.0–100.0)
Monocytes Absolute: 0.3 10*3/uL (ref 0.1–1.0)
Monocytes Relative: 4 %
Neutro Abs: 4.5 10*3/uL (ref 1.7–7.7)
Neutrophils Relative %: 77 %
Platelets: 353 10*3/uL (ref 150–400)
RBC: 4.7 MIL/uL (ref 3.87–5.11)
RDW: 13.9 % (ref 11.5–15.5)
WBC: 5.9 10*3/uL (ref 4.0–10.5)
nRBC: 0 % (ref 0.0–0.2)

## 2021-09-19 LAB — RESP PANEL BY RT-PCR (FLU A&B, COVID) ARPGX2
Influenza A by PCR: NEGATIVE
Influenza B by PCR: NEGATIVE
SARS Coronavirus 2 by RT PCR: NEGATIVE

## 2021-09-19 LAB — LIPASE, BLOOD: Lipase: 23 U/L (ref 11–51)

## 2021-09-19 LAB — PREGNANCY, URINE: Preg Test, Ur: NEGATIVE

## 2021-09-19 MED ORDER — OXYCODONE-ACETAMINOPHEN 5-325 MG PO TABS: 1.0000 | ORAL_TABLET | Freq: Three times a day (TID) | ORAL | 0 refills | Status: DC | PRN

## 2021-09-19 MED ORDER — SODIUM CHLORIDE 0.9 % IV BOLUS
1000.0000 mL | Freq: Once | INTRAVENOUS | Status: AC
  Administered 2021-09-19: 11:00:00 1000 mL via INTRAVENOUS

## 2021-09-19 MED ORDER — ONDANSETRON HCL 4 MG PO TABS: 4.0000 mg | ORAL_TABLET | Freq: Four times a day (QID) | ORAL | 0 refills | Status: DC

## 2021-09-19 MED ORDER — FENTANYL CITRATE PF 50 MCG/ML IJ SOSY
50.0000 ug | PREFILLED_SYRINGE | Freq: Once | INTRAMUSCULAR | Status: AC
  Administered 2021-09-19: 14:00:00 50 ug via INTRAVENOUS
  Filled 2021-09-19: qty 1

## 2021-09-19 MED ORDER — AMOXICILLIN-POT CLAVULANATE 875-125 MG PO TABS: 1.0000 | ORAL_TABLET | Freq: Two times a day (BID) | ORAL | 0 refills | Status: DC

## 2021-09-19 MED ORDER — FENTANYL CITRATE PF 50 MCG/ML IJ SOSY
50.0000 ug | PREFILLED_SYRINGE | Freq: Once | INTRAMUSCULAR | Status: AC
  Administered 2021-09-19: 11:00:00 50 ug via INTRAVENOUS
  Filled 2021-09-19: qty 1

## 2021-09-19 NOTE — ED Notes (Signed)
Informed [place] - RN of pt's BP and pain (10 out of 10)

## 2021-09-19 NOTE — ED Triage Notes (Signed)
Pt has been having back spasms for over a week, recent flu and also recent treatment for UTI which she had a reaction to. Her PCP told her to come in for re evaluation and treatment.

## 2021-09-19 NOTE — ED Notes (Signed)
Patient verbalizes understanding of discharge instructions. Opportunity for questioning and answers were provided. Patient discharged from ED.  °

## 2021-09-19 NOTE — ED Provider Notes (Signed)
[facility] EMERGENCY DEPT Provider Note   CSN: [id number] Arrival date & time: 09/19/21  1324     History Chief Complaint  Patient presents with   Back Pain    Jodi Mcgee is a 35 y.o. female.  The history is provided by the patient.  Flank Pain This is a recurrent problem. The problem occurs constantly. The problem has not changed since onset.Pertinent negatives include no chest pain, no abdominal pain, no headaches and no shortness of breath. Nothing aggravates the symptoms. Nothing relieves the symptoms. She has tried nothing for the symptoms. The treatment provided no relief.      Past Medical History:  Diagnosis Date   Anemia    Anxiety    Blood transfusion without reported diagnosis    both CS   Cocaine abuse ([place])    Diabetes mellitus without complication ([place])    GERD (gastroesophageal reflux disease)    has resolved   IBS (irritable bowel syndrome)    Ovarian cyst    Peritonitis, acute generalized ([place])    Pulmonary embolism ([facility])    SBO (small bowel obstruction) ([place])    Sepsis ([place])     Patient Active Problem List   Diagnosis Date Noted   Hypokalemia 03/22/2021   GERD (gastroesophageal reflux disease) 03/22/2021   Pyelonephritis 03/21/2021   Controlled type 2 diabetes mellitus with hyperglycemia ([place]) 05/10/2020   Partial small bowel obstruction ([place]) 05/09/2020   Bartholin's gland abscess 02/21/2019   DKA (diabetic ketoacidoses) 02/20/2019   Blood per rectum 02/20/2019   Hyponatremia 09/26/2018   Chronic hypertension 01/03/2018   IUD contraception 01/03/2018   Pulmonary embolism during puerperium 12/04/2017   Pulmonary edema 11/30/2017   Pelvic adhesive disease 10/22/2017   Nasal septal perforation 10/17/2017   History of cocaine abuse ([place]) 10/17/2017   Epistaxis 10/17/2017   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   Obesity (BMI 30-39.9) 09/25/2017   Type 2 diabetes mellitus, without long-term current use of insulin  ([place]) 08/23/2017   Chronic pain syndrome 08/23/2017    Past Surgical History:  Procedure Laterality Date   APPENDECTOMY     ruptured    BUNIONECTOMY     CESAREAN SECTION     CESAREAN SECTION Bilateral 11/20/2017   Procedure: CESAREAN SECTION;  Surgeon: Sloan Leiter, MD;  Location: [facility];  Service: Obstetrics;  Laterality: Bilateral;   OVARIAN CYST DRAINAGE     SMALL BOWEL REPAIR       OB History     Gravida  3   Para  3   Term  1   Preterm  2   AB  0   Living  3      SAB  0   IAB  0   Ectopic  0   Multiple  0   Live Births  3           Family History  Problem Relation Age of Onset   Hypertension Mother    Anemia Mother    Diabetes Father     Social History   Tobacco Use   Smoking status: Former    Packs/day: 0.50    Types: Cigarettes   Smokeless tobacco: Never   Tobacco comments:    social use with tobacco  Vaping Use   Vaping Use: Former  Substance Use Topics   Alcohol use: Yes    Comment: occ   Drug use: Not Currently    Home Medications Prior to Admission medications   Medication  Sig Start Date End Date Taking? Authorizing Provider  amoxicillin-clavulanate (AUGMENTIN) 875-125 MG tablet Take 1 tablet by mouth every 12 (twelve) hours. 09/19/21  Yes Wetzel Meester, DO  ondansetron (ZOFRAN) 4 MG tablet Take 1 tablet (4 mg total) by mouth every 6 (six) hours. 09/19/21  Yes Earlin Sweeden, DO  blood glucose meter kit and supplies KIT Dispense based on patient and insurance preference. Use up to four times daily as directed. (FOR ICD-9 250.00, 250.01). 02/21/19   Oretha Milch D, MD  cephALEXin (KEFLEX) 500 MG capsule Take 1 capsule (500 mg total) by mouth 4 (four) times daily. 09/04/21   Mesner, Corene Cornea, MD  cyclobenzaprine (FLEXERIL) 10 MG tablet Take 1 tablet (10 mg total) by mouth 3 (three) times daily as needed for muscle spasms. 09/17/21   Veryl Speak, MD  HUMULIN R 100 UNIT/ML injection Inject 20 Units into the skin 3  (three) times daily before meals. Per sliding scale 03/24/20   [provider]  hyoscyamine (LEVSIN SL) 0.125 MG SL tablet Place 1 tablet (0.125 mg total) under the tongue every 4 (four) hours as needed for cramping (abdominal pain). 04/15/21   Orpah Greek, MD  insulin detemir (LEVEMIR) 100 unit/ml SOLN Inject 20 Units into the skin 3 (three) times daily. 03/02/21   Matilde Haymaker, MD  Levonorgestrel (LILETTA) 19.5 MCG/DAY IUD IUD 1 each by Intrauterine route continuous. Placed in 2019    [provider]  loperamide (IMODIUM) 2 MG capsule Take 1 capsule (2 mg total) by mouth 4 (four) times daily as needed for diarrhea or loose stools. 04/08/21   Tedd Sias, PA  ondansetron (ZOFRAN ODT) 4 MG disintegrating tablet 80m ODT q4 hours prn nausea/vomit 09/04/21   Mesner, JCorene Cornea MD  oseltamivir (TAMIFLU) 75 MG capsule Take 1 capsule (75 mg total) by mouth every 12 (twelve) hours. 09/05/21   LDoristine Devoid PA-C  oxyCODONE-acetaminophen (PERCOCET) 5-325 MG tablet Take 1 tablet by mouth every 8 (eight) hours as needed for up to 10 doses for severe pain. 09/19/21   Kaina Orengo, DO  polyethylene glycol (MIRALAX / GLYCOLAX) 17 g packet Take 17 g by mouth daily. 03/26/21   Arrien, MJimmy Picket MD  potassium chloride (KLOR-CON) 10 MEQ tablet Take 1 tablet (10 mEq total) by mouth daily. 04/08/21   FTedd Sias PA  potassium chloride (KLOR-CON) 10 MEQ tablet Take 1 tablet (10 mEq total) by mouth 2 (two) times daily. 04/25/21   Palumbo, April, MD  tizanidine (ZANAFLEX) 2 MG capsule Take 1 capsule (2 mg total) by mouth 3 (three) times daily as needed for up to 30 doses for muscle spasms. 08/13/21   Addalyn Speedy, DO    Allergies    Hydrocodone, Iodinated diagnostic agents, Cetirizine & related, Toradol [ketorolac tromethamine], Flagyl [metronidazole], Keflex [cephalexin], and Nsaids  Review of Systems   Review of Systems  Constitutional:  Negative for chills and fever.   HENT:  Negative for ear pain and sore throat.   Eyes:  Negative for pain and visual disturbance.  Respiratory:  Negative for cough and shortness of breath.   Cardiovascular:  Negative for chest pain and palpitations.  Gastrointestinal:  Negative for abdominal pain and vomiting.  Genitourinary:  Positive for flank pain. Negative for dysuria and hematuria.  Musculoskeletal:  Negative for arthralgias and back pain.  Skin:  Negative for color change and rash.  Neurological:  Negative for seizures, syncope and headaches.  All other systems reviewed and are negative.  Physical Exam Updated  Vital Signs BP (!) 174/128   Pulse (!) 102   Temp 98.5 F (36.9 C)   Resp 16   Ht '5\' 7"'  (1.702 m)   Wt 63.5 kg   SpO2 100%   BMI 21.93 kg/m   Physical Exam Vitals and nursing note reviewed.  Constitutional:      General: She is not in acute distress.    Appearance: She is well-developed.  HENT:     Head: Normocephalic and atraumatic.     Nose: Nose normal.     Mouth/Throat:     Mouth: Mucous membranes are dry.  Eyes:     Extraocular Movements: Extraocular movements intact.     Conjunctiva/sclera: Conjunctivae normal.     Pupils: Pupils are equal, round, and reactive to light.  Cardiovascular:     Rate and Rhythm: Normal rate and regular rhythm.     Heart sounds: No murmur heard. Pulmonary:     Effort: Pulmonary effort is normal. No respiratory distress.     Breath sounds: Normal breath sounds.  Abdominal:     Palpations: Abdomen is soft.     Tenderness: There is no abdominal tenderness. There is left CVA tenderness.  Musculoskeletal:        General: No swelling or tenderness.     Cervical back: Normal range of motion and neck supple. No tenderness.     Comments: No midline spinal tenderness but tenderness to the left paraspinal muscles of the mid thoracic and lumbar region  Skin:    General: Skin is warm and dry.     Capillary Refill: Capillary refill takes less than 2 seconds.   Neurological:     General: No focal deficit present.     Mental Status: She is alert and oriented to person, place, and time.     Cranial Nerves: No cranial nerve deficit.     Sensory: No sensory deficit.     Motor: No weakness.     Coordination: Coordination normal.  Psychiatric:        Mood and Affect: Mood normal.    ED Results / Procedures / Treatments   Labs (all labs ordered are listed, but only abnormal results are displayed) Labs Reviewed  COMPREHENSIVE METABOLIC PANEL - Abnormal; Notable for the following components:      Result Value   Potassium 3.2 (*)    Chloride 97 (*)    Glucose, Bld 304 (*)    AST 11 (*)    All other components within normal limits  URINALYSIS, ROUTINE W REFLEX MICROSCOPIC - Abnormal; Notable for the following components:   APPearance HAZY (*)    Glucose, UA 500 (*)    Bacteria, UA RARE (*)    All other components within normal limits  RESP PANEL BY RT-PCR (FLU A&B, COVID) ARPGX2  CBC WITH DIFFERENTIAL/PLATELET  LIPASE, BLOOD  PREGNANCY, URINE    EKG None  Radiology CT Renal Stone Study  Result Date: 09/19/2021 CLINICAL DATA:  Left flank pain, back spasms, recent flu.  UTI. EXAM: CT ABDOMEN AND PELVIS WITHOUT CONTRAST TECHNIQUE: Multidetector CT imaging of the abdomen and pelvis was performed following the standard protocol without IV contrast. COMPARISON:  09/03/2021. FINDINGS: Lower chest: Not imaged. Hepatobiliary: Visualized portions of the liver and gallbladder are unremarkable. Pancreas: Negative. Spleen: Visualized portion is unremarkable. Adrenals/Urinary Tract: Adrenal glands are unremarkable. No urinary stones. Ureters are decompressed. Bladder is grossly unremarkable. Stomach/Bowel: Stomach and small bowel are unremarkable. Appendix is not visualized. The wall of the ascending and  transverse colon appears thickened with surrounding inflammatory haziness, new from 09/03/2021. Remainder of the colon is unremarkable.  Vascular/Lymphatic: Vascular structures are unremarkable. No pathologically enlarged lymph nodes. Reproductive: Uterus is visualized. Intrauterine contraceptive device is in place. No adnexal mass. Other: No free fluid. Mesenteries and peritoneum are otherwise unremarkable. Musculoskeletal: None. IMPRESSION: 1. No urinary stones or obstruction. 2. Wall thickening and slight pericolonic inflammatory haziness involving the ascending and transverse colon, indicative of an infectious/inflammatory colitis. Electronically Signed   By: Lorin Picket M.D.   On: 09/19/2021 12:01    Procedures Procedures   Medications Ordered in ED Medications  fentaNYL (SUBLIMAZE) injection 50 mcg (has no administration in time range)  fentaNYL (SUBLIMAZE) injection 50 mcg (50 mcg Intravenous Given 09/19/21 1123)  sodium chloride 0.9 % bolus 1,000 mL (0 mLs Intravenous Stopped 09/19/21 1304)    ED Course  I have reviewed the triage vital signs and the nursing notes.  Pertinent labs & imaging results that were available during my care of the patient were reviewed by me and considered in my medical decision making (see chart for details).    MDM Rules/Calculators/A&P                           Suha ARIJANA NARAYAN is here with diarrhea, left lower quadrant abdominal pain.  Patient with history of the same.  No significant leukocytosis, anemia, electrolyte abnormality except for mild elevation of blood sugar.  No evidence of DKA.  Concern for kidney stone versus colitis.  Will give IV fluids, IV fentanyl, IV Zofran and reevaluate.  We will get a CT scan abdomen pelvis including labs.  CT scan shows likely infectious versus inflammatory colitis.  Lab work is otherwise unremarkable.  We will treat with Augmentin, Zofran.  Will give narcotics for breakthrough pain.  Discharged in good condition.  Understands return precautions.  This chart was dictated using voice recognition software.  Despite best efforts to proofread,   errors can occur which can change the documentation meaning.   Final Clinical Impression(s) / ED Diagnoses Final diagnoses:  Colitis    Rx / DC Orders ED Discharge Orders          Ordered    amoxicillin-clavulanate (AUGMENTIN) 875-125 MG tablet  Every 12 hours        09/19/21 1328    oxyCODONE-acetaminophen (PERCOCET) 5-325 MG tablet  Every 8 hours PRN        09/19/21 1328    ondansetron (ZOFRAN) 4 MG tablet  Every 6 hours        09/19/21 [place], Alysa Duca, DO 09/19/21 1330

## 2021-09-27 ENCOUNTER — Other Ambulatory Visit: Payer: Self-pay

## 2021-09-27 ENCOUNTER — Encounter (HOSPITAL_COMMUNITY): Payer: Self-pay

## 2021-09-27 ENCOUNTER — Emergency Department (HOSPITAL_COMMUNITY)
Admission: EM | Admit: 2021-09-27 | Discharge: 2021-09-27 | Disposition: A | Discharge status: Home / Self Care | Payer: Medicaid Other | Attending: Emergency Medicine | Admitting: Emergency Medicine

## 2021-09-27 ENCOUNTER — Emergency Department (HOSPITAL_COMMUNITY): Payer: Medicaid Other

## 2021-09-27 DIAGNOSIS — I10 Essential (primary) hypertension: Secondary | ICD-10-CM | POA: Diagnosis not present

## 2021-09-27 DIAGNOSIS — Z794 Long term (current) use of insulin: Secondary | ICD-10-CM | POA: Diagnosis not present

## 2021-09-27 DIAGNOSIS — K625 Hemorrhage of anus and rectum: Secondary | ICD-10-CM | POA: Insufficient documentation

## 2021-09-27 DIAGNOSIS — Z79899 Other long term (current) drug therapy: Secondary | ICD-10-CM | POA: Insufficient documentation

## 2021-09-27 DIAGNOSIS — R1030 Lower abdominal pain, unspecified: Secondary | ICD-10-CM

## 2021-09-27 DIAGNOSIS — E119 Type 2 diabetes mellitus without complications: Secondary | ICD-10-CM | POA: Insufficient documentation

## 2021-09-27 DIAGNOSIS — F17210 Nicotine dependence, cigarettes, uncomplicated: Secondary | ICD-10-CM | POA: Diagnosis not present

## 2021-09-27 LAB — CBC WITH DIFFERENTIAL/PLATELET
Abs Immature Granulocytes: 0.01 10*3/uL (ref 0.00–0.07)
Basophils Absolute: 0 10*3/uL (ref 0.0–0.1)
Basophils Relative: 0 %
Eosinophils Absolute: 0.1 10*3/uL (ref 0.0–0.5)
Eosinophils Relative: 2 %
HCT: 36.6 % (ref 36.0–46.0)
Hemoglobin: 12.2 g/dL (ref 12.0–15.0)
Immature Granulocytes: 0 %
Lymphocytes Relative: 24 %
Lymphs Abs: 1.6 10*3/uL (ref 0.7–4.0)
MCH: 28.2 pg (ref 26.0–34.0)
MCHC: 33.3 g/dL (ref 30.0–36.0)
MCV: 84.5 fL (ref 80.0–100.0)
Monocytes Absolute: 0.5 10*3/uL (ref 0.1–1.0)
Monocytes Relative: 8 %
Neutro Abs: 4.6 10*3/uL (ref 1.7–7.7)
Neutrophils Relative %: 66 %
Platelets: 278 10*3/uL (ref 150–400)
RBC: 4.33 MIL/uL (ref 3.87–5.11)
RDW: 14.9 % (ref 11.5–15.5)
WBC: 6.8 10*3/uL (ref 4.0–10.5)
nRBC: 0 % (ref 0.0–0.2)

## 2021-09-27 LAB — COMPREHENSIVE METABOLIC PANEL
ALT: 11 U/L (ref 0–44)
AST: 11 U/L — ABNORMAL LOW (ref 15–41)
Albumin: 3.5 g/dL (ref 3.5–5.0)
Alkaline Phosphatase: 55 U/L (ref 38–126)
Anion gap: 9 (ref 5–15)
BUN: 14 mg/dL (ref 6–20)
CO2: 27 mmol/L (ref 22–32)
Calcium: 8.8 mg/dL — ABNORMAL LOW (ref 8.9–10.3)
Chloride: 102 mmol/L (ref 98–111)
Creatinine, Ser: 0.52 mg/dL (ref 0.44–1.00)
GFR, Estimated: >60 mL/min (ref >60)
Glucose, Bld: 260 mg/dL — ABNORMAL HIGH (ref 70–99)
Potassium: 3.1 mmol/L — ABNORMAL LOW (ref 3.5–5.1)
Sodium: 138 mmol/L (ref 135–145)
Total Bilirubin: 0.8 mg/dL (ref 0.3–1.2)
Total Protein: 7.2 g/dL (ref 6.5–8.1)

## 2021-09-27 LAB — I-STAT BETA HCG BLOOD, ED (MC, WL, AP ONLY): I-stat hCG, quantitative: <5 m[IU]/mL (ref <5)

## 2021-09-27 MED ORDER — MORPHINE SULFATE (PF) 4 MG/ML IV SOLN
4.0000 mg | Freq: Once | INTRAVENOUS | Status: AC
  Administered 2021-09-27: 4 mg via INTRAVENOUS
  Filled 2021-09-27: qty 1

## 2021-09-27 MED ORDER — ONDANSETRON HCL 4 MG/2ML IJ SOLN
4.0000 mg | Freq: Once | INTRAMUSCULAR | Status: AC
  Administered 2021-09-27: 4 mg via INTRAVENOUS
  Filled 2021-09-27: qty 2

## 2021-09-27 MED ORDER — SODIUM CHLORIDE 0.9 % IV BOLUS
1000.0000 mL | Freq: Once | INTRAVENOUS | Status: AC
  Administered 2021-09-27: 1000 mL via INTRAVENOUS

## 2021-09-27 MED ORDER — DICYCLOMINE HCL 20 MG PO TABS: 20.0000 mg | ORAL_TABLET | Freq: Two times a day (BID) | ORAL | 0 refills | Status: DC

## 2021-09-27 NOTE — ED Provider Notes (Signed)
Emergency Medicine Provider Triage Evaluation Note  Jodi Mcgee , a 35 y.o. female  was evaluated in triage.  Pt complains of abd pain, n/v. Pt states she is having worsened L sided abd pain. Today she had dark red bloody diarrhea. She was dx with colitis a few days ago. Given abx, which she has been taking, but sxs are worsening. No fever. Not on blood thinners  Review of Systems  Positive: LLQ abd pain, blood in stool Negative: fever  Physical Exam  BP 117/78 (BP Location: Left Arm)   Pulse 78   Temp 99 F (37.2 C) (Oral)   Resp 16   Ht 5\' 7"  (1.702 m)   Wt 59 kg   SpO2 100%   BMI 20.36 kg/m  Gen:   Awake, no distress   Resp:  Normal effort  MSK:   Moves extremities without difficulty  Other:  Diffuse ttp of the abd, worse in the LLQ and and L low back.   Medical Decision Making  Medically screening exam initiated at 10:26 AM.  Appropriate orders placed.  Jodi Mcgee was informed that the remainder of the evaluation will be completed by another provider, this initial triage assessment does not replace that evaluation, and the importance of remaining in the ED until their evaluation is complete.  Labs, ua   [place], [place], PA-C 09/27/21 [place], [place], DO 09/27/21 1440

## 2021-09-27 NOTE — ED Notes (Signed)
I attempted to collect labs and was unsuccessful. 

## 2021-09-27 NOTE — Discharge Instructions (Signed)
Take Imodium.  This is a medication you can get over-the-counter.  I will prescribe a medicine for your abdominal cramps.  Please follow-up with your gastroenterologist in the office.

## 2021-09-27 NOTE — ED Triage Notes (Signed)
Patient states she had dark red blood in her diarrheal stool today. No clots. Paient also c/o N/V Patient states that she is having LLQ and RLQ L>R. abdominal pain and back pain. Patient is lethargic in triage.

## 2021-09-27 NOTE — ED Provider Notes (Signed)
[facility] DEPT Provider Note   CSN: [id number] Arrival date & time: 09/27/21  0947     History Chief Complaint  Patient presents with   GI Bleeding    Jodi Mcgee is a 35 y.o. female.  35 yo F with a chief complaints of blood in her stool.  Is been going on for about 2 or 3 days.  Patient was recently diagnosed with colitis and started on antibiotics.  She feels like her pain is gotten a bit better.  No nausea or vomiting.  No fevers.  The history is provided by the patient.  Illness Severity:  Moderate Onset quality:  Gradual Duration:  2 days Timing:  Constant Progression:  Worsening Chronicity:  New Associated symptoms: abdominal pain   Associated symptoms: no chest pain, no congestion, no fever, no headaches, no myalgias, no nausea, no rhinorrhea, no shortness of breath, no vomiting and no wheezing       Past Medical History:  Diagnosis Date   Anemia    Anxiety    Blood transfusion without reported diagnosis    both CS   Cocaine abuse ([place])    Diabetes mellitus without complication ([place])    GERD (gastroesophageal reflux disease)    has resolved   IBS (irritable bowel syndrome)    Ovarian cyst    Peritonitis, acute generalized ([place])    Pulmonary embolism ([facility])    SBO (small bowel obstruction) ([place])    Sepsis ([place])     Patient Active Problem List   Diagnosis Date Noted   Hypokalemia 03/22/2021   GERD (gastroesophageal reflux disease) 03/22/2021   Pyelonephritis 03/21/2021   Controlled type 2 diabetes mellitus with hyperglycemia ([place]) 05/10/2020   Partial small bowel obstruction ([place]) 05/09/2020   Bartholin's gland abscess 02/21/2019   DKA (diabetic ketoacidoses) 02/20/2019   Blood per rectum 02/20/2019   Hyponatremia 09/26/2018   Chronic hypertension 01/03/2018   IUD contraception 01/03/2018   Pulmonary embolism during puerperium 12/04/2017   Pulmonary edema 11/30/2017   Pelvic adhesive disease 10/22/2017   Nasal  septal perforation 10/17/2017   History of cocaine abuse ([place]) 10/17/2017   Epistaxis 10/17/2017   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   Obesity (BMI 30-39.9) 09/25/2017   Type 2 diabetes mellitus, without long-term current use of insulin ([place]) 08/23/2017   Chronic pain syndrome 08/23/2017    Past Surgical History:  Procedure Laterality Date   APPENDECTOMY     ruptured    BUNIONECTOMY     CESAREAN SECTION     CESAREAN SECTION Bilateral 11/20/2017   Procedure: CESAREAN SECTION;  Surgeon: Sloan Leiter, MD;  Location: [facility];  Service: Obstetrics;  Laterality: Bilateral;   OVARIAN CYST DRAINAGE     SMALL BOWEL REPAIR       OB History     Gravida  3   Para  3   Term  1   Preterm  2   AB  0   Living  3      SAB  0   IAB  0   Ectopic  0   Multiple  0   Live Births  3           Family History  Problem Relation Age of Onset   Hypertension Mother    Anemia Mother    Diabetes Father     Social History   Tobacco Use   Smoking status: Every Day    Packs/day: 0.15    Types:  Cigarettes   Smokeless tobacco: Never   Tobacco comments:    social use with tobacco  Vaping Use   Vaping Use: Former  Substance Use Topics   Alcohol use: Not Currently    Comment: occ   Drug use: Not Currently    Home Medications Prior to Admission medications   Medication Sig Start Date End Date Taking? Authorizing Provider  dicyclomine (BENTYL) 20 MG tablet Take 1 tablet (20 mg total) by mouth 2 (two) times daily. 09/27/21  Yes Deno Etienne, DO  amoxicillin-clavulanate (AUGMENTIN) 875-125 MG tablet Take 1 tablet by mouth every 12 (twelve) hours. 09/19/21   Curatolo, Adam, DO  blood glucose meter kit and supplies KIT Dispense based on patient and insurance preference. Use up to four times daily as directed. (FOR ICD-9 250.00, 250.01). 02/21/19   Oretha Milch D, MD  cephALEXin (KEFLEX) 500 MG capsule Take 1 capsule (500 mg total) by mouth 4 (four) times daily.  09/04/21   Mesner, Corene Cornea, MD  cyclobenzaprine (FLEXERIL) 10 MG tablet Take 1 tablet (10 mg total) by mouth 3 (three) times daily as needed for muscle spasms. 09/17/21   Veryl Speak, MD  HUMULIN R 100 UNIT/ML injection Inject 20 Units into the skin 3 (three) times daily before meals. Per sliding scale 03/24/20   [provider]  hyoscyamine (LEVSIN SL) 0.125 MG SL tablet Place 1 tablet (0.125 mg total) under the tongue every 4 (four) hours as needed for cramping (abdominal pain). 04/15/21   Orpah Greek, MD  insulin detemir (LEVEMIR) 100 unit/ml SOLN Inject 20 Units into the skin 3 (three) times daily. 03/02/21   Matilde Haymaker, MD  Levonorgestrel (LILETTA) 19.5 MCG/DAY IUD IUD 1 each by Intrauterine route continuous. Placed in 2019    [provider]  loperamide (IMODIUM) 2 MG capsule Take 1 capsule (2 mg total) by mouth 4 (four) times daily as needed for diarrhea or loose stools. 04/08/21   Tedd Sias, PA  ondansetron (ZOFRAN ODT) 4 MG disintegrating tablet 36m ODT q4 hours prn nausea/vomit 09/04/21   Mesner, JCorene Cornea MD  ondansetron (ZOFRAN) 4 MG tablet Take 1 tablet (4 mg total) by mouth every 6 (six) hours. 09/19/21   Curatolo, Adam, DO  oseltamivir (TAMIFLU) 75 MG capsule Take 1 capsule (75 mg total) by mouth every 12 (twelve) hours. 09/05/21   LDoristine Devoid PA-C  oxyCODONE-acetaminophen (PERCOCET) 5-325 MG tablet Take 1 tablet by mouth every 8 (eight) hours as needed for up to 10 doses for severe pain. 09/19/21   Curatolo, Adam, DO  polyethylene glycol (MIRALAX / GLYCOLAX) 17 g packet Take 17 g by mouth daily. 03/26/21   Arrien, MJimmy Picket MD  potassium chloride (KLOR-CON) 10 MEQ tablet Take 1 tablet (10 mEq total) by mouth daily. 04/08/21   FTedd Sias PA  potassium chloride (KLOR-CON) 10 MEQ tablet Take 1 tablet (10 mEq total) by mouth 2 (two) times daily. 04/25/21   Palumbo, April, MD  tizanidine (ZANAFLEX) 2 MG capsule Take 1 capsule (2 mg total) by  mouth 3 (three) times daily as needed for up to 30 doses for muscle spasms. 08/13/21   Curatolo, Adam, DO    Allergies    Hydrocodone, Iodinated diagnostic agents, Cetirizine & related, Toradol [ketorolac tromethamine], Flagyl [metronidazole], Keflex [cephalexin], and Nsaids  Review of Systems   Review of Systems  Constitutional:  Negative for chills and fever.  HENT:  Negative for congestion and rhinorrhea.   Eyes:  Negative for redness and visual disturbance.  Respiratory:  Negative for shortness of breath and wheezing.   Cardiovascular:  Negative for chest pain and palpitations.  Gastrointestinal:  Positive for abdominal pain and blood in stool. Negative for nausea and vomiting.  Genitourinary:  Negative for dysuria and urgency.  Musculoskeletal:  Negative for arthralgias and myalgias.  Skin:  Negative for pallor and wound.  Neurological:  Negative for dizziness and headaches.   Physical Exam Updated Vital Signs BP 120/81 (BP Location: Right Arm)   Pulse 96   Temp 98.4 F (36.9 C) (Oral)   Resp 20   Ht '5\' 7"'  (1.702 m)   Wt 59 kg   SpO2 100%   BMI 20.36 kg/m   Physical Exam Vitals and nursing note reviewed.  Constitutional:      General: She is not in acute distress.    Appearance: She is well-developed. She is not diaphoretic.  HENT:     Head: Normocephalic and atraumatic.  Eyes:     Pupils: Pupils are equal, round, and reactive to light.  Cardiovascular:     Rate and Rhythm: Normal rate and regular rhythm.     Heart sounds: No murmur heard.   No friction rub. No gallop.  Pulmonary:     Effort: Pulmonary effort is normal.     Breath sounds: No wheezing or rales.  Abdominal:     General: There is no distension.     Palpations: Abdomen is soft.     Tenderness: There is no abdominal tenderness.  Musculoskeletal:        General: No tenderness.     Cervical back: Normal range of motion and neck supple.  Skin:    General: Skin is warm and dry.  Neurological:      Mental Status: She is alert and oriented to person, place, and time.  Psychiatric:        Behavior: Behavior normal.    ED Results / Procedures / Treatments   Labs (all labs ordered are listed, but only abnormal results are displayed) Labs Reviewed  COMPREHENSIVE METABOLIC PANEL - Abnormal; Notable for the following components:      Result Value   Potassium 3.1 (*)    Glucose, Bld 260 (*)    Calcium 8.8 (*)    AST 11 (*)    All other components within normal limits  CBC WITH DIFFERENTIAL/PLATELET  I-STAT BETA HCG BLOOD, ED ([facility], [facility], [facility] ONLY)    EKG None  Radiology CT ABDOMEN PELVIS WO CONTRAST  Result Date: 09/27/2021 CLINICAL DATA:  Abdominal pain with nausea, vomiting and bloody diarrhea. Diagnosed with colitis a few days ago. Infectious gastroenteritis or colitis. EXAM: CT ABDOMEN AND PELVIS WITHOUT CONTRAST TECHNIQUE: Multidetector CT imaging of the abdomen and pelvis was performed following the standard protocol without IV contrast. COMPARISON:  Abdominopelvic CT 09/19/2021 and 09/03/2021. FINDINGS: The current and recent prior studies were performed without intravenous and enteric contrast. Lower chest: Minimal right basilar atelectasis or scarring. The lung bases are otherwise clear. No significant pleural or pericardial effusion. Hepatobiliary: The liver appears unremarkable as imaged in the noncontrast state. The gallbladder is incompletely distended. No evidence of gallstones, gallbladder wall thickening or biliary dilatation. Pancreas: Unremarkable. No pancreatic ductal dilatation or surrounding inflammatory changes. Spleen: Normal in size without focal abnormality. Adrenals/Urinary Tract: The adrenal glands appear stable with a probable small fat containing right adrenal adenoma measuring 1.7 cm and 0 HU on image 31/2. Both kidneys appear unremarkable. No evidence of renal calculus, hydronephrosis or perinephric soft tissue stranding.  No ureteral calculi are evident. Small  retroperitoneal calcifications bilaterally are unchanged, likely phleboliths. The bladder appears unremarkable. Stomach/Bowel: No enteric contrast administered. Bowel assessment limited by the lack of intravenous and enteric contrast. The right and transverse colonic wall thickening and surrounding inflammatory changes seen on the most recent study have improved or resolved. The stomach appears unremarkable for its degree of distension. No evidence of bowel wall thickening, distention or surrounding inflammatory change. The appendix is not visualized. Vascular/Lymphatic: There are no enlarged abdominal or pelvic lymph nodes. No significant vascular findings on noncontrast imaging. Reproductive: Intrauterine device in place. No evidence of adnexal mass. Other: No ascites, free air or focal extraluminal fluid collection identified. Musculoskeletal: No acute or significant osseous findings. IMPRESSION: 1. The right and transverse colon wall thickening and surrounding inflammatory changes seen on the prior study of 8 days ago appear improved/resolved. 2. No new findings are identified. No evidence of bowel obstruction or perforation. Bowel assessment limited by the lack of oral and intravenous contrast. 3. No evidence of urinary tract calculus or hydronephrosis. 4. Probable small incidental right adrenal adenoma. Electronically Signed   By: Richardean Sale M.D.   On: 09/27/2021 14:41    Procedures Procedures   Medications Ordered in ED Medications  sodium chloride 0.9 % bolus 1,000 mL (0 mLs Intravenous Stopped 09/27/21 1711)  ondansetron (ZOFRAN) injection 4 mg (4 mg Intravenous Given 09/27/21 1307)  morphine 4 MG/ML injection 4 mg (4 mg Intravenous Given 09/27/21 1307)  sodium chloride 0.9 % bolus 1,000 mL (0 mLs Intravenous Stopped 09/27/21 1711)  morphine 4 MG/ML injection 4 mg (4 mg Intravenous Given 09/27/21 1527)  ondansetron (ZOFRAN) injection 4 mg (4 mg Intravenous Given 09/27/21 1527)    ED Course  I  have reviewed the triage vital signs and the nursing notes.  Pertinent labs & imaging results that were available during my care of the patient were reviewed by me and considered in my medical decision making (see chart for details).    MDM Rules/Calculators/A&P                           35 yo F with a chief complaints of rectal bleeding and worsening mild pain to the lower abdomen.  Was diagnosed with colitis recently and had been started on antibiotics.  She feels like this is gotten worse.  No obvious significant pain on exam but the patient is somewhat tachycardic.  Will obtain blood work CT scan and reassess.  CT with some improvement.  No signs of complication.  Patient feeling better on reassessment.  D/c home.  7:06 AM:  I have discussed the diagnosis/risks/treatment options with the patient and believe the pt to be eligible for discharge home to follow-up with PCP. We also discussed returning to the ED immediately if new or worsening sx occur. We discussed the sx which are most concerning (e.g., sudden worsening pain, fever, inability to tolerate by mouth) that necessitate immediate return. Medications administered to the patient during their visit and any new prescriptions provided to the patient are listed below.  Medications given during this visit Medications  sodium chloride 0.9 % bolus 1,000 mL (0 mLs Intravenous Stopped 09/27/21 1711)  ondansetron (ZOFRAN) injection 4 mg (4 mg Intravenous Given 09/27/21 1307)  morphine 4 MG/ML injection 4 mg (4 mg Intravenous Given 09/27/21 1307)  sodium chloride 0.9 % bolus 1,000 mL (0 mLs Intravenous Stopped 09/27/21 1711)  morphine 4 MG/ML injection 4 mg (  4 mg Intravenous Given 09/27/21 1527)  ondansetron (ZOFRAN) injection 4 mg (4 mg Intravenous Given 09/27/21 1527)     The patient appears reasonably screen and/or stabilized for discharge and I doubt any other medical condition or other [facility] requiring further screening, evaluation, or treatment  in the ED at this time prior to discharge.   Final Clinical Impression(s) / ED Diagnoses Final diagnoses:  Lower abdominal pain  Rectal bleeding    Rx / DC Orders ED Discharge Orders          Ordered    dicyclomine (BENTYL) 20 MG tablet  2 times daily        09/27/21 [place], [place], DO 09/28/21 [phone]

## 2021-10-02 ENCOUNTER — Emergency Department (HOSPITAL_BASED_OUTPATIENT_CLINIC_OR_DEPARTMENT_OTHER): Payer: Medicaid Other

## 2021-10-02 ENCOUNTER — Emergency Department (HOSPITAL_BASED_OUTPATIENT_CLINIC_OR_DEPARTMENT_OTHER)
Admission: EM | Admit: 2021-10-02 | Discharge: 2021-10-02 | Disposition: A | Discharge status: Home / Self Care | Payer: Medicaid Other | Attending: Emergency Medicine | Admitting: Emergency Medicine

## 2021-10-02 ENCOUNTER — Other Ambulatory Visit: Payer: Self-pay

## 2021-10-02 ENCOUNTER — Encounter (HOSPITAL_BASED_OUTPATIENT_CLINIC_OR_DEPARTMENT_OTHER): Payer: Self-pay

## 2021-10-02 DIAGNOSIS — I10 Essential (primary) hypertension: Secondary | ICD-10-CM | POA: Insufficient documentation

## 2021-10-02 DIAGNOSIS — Z794 Long term (current) use of insulin: Secondary | ICD-10-CM | POA: Insufficient documentation

## 2021-10-02 DIAGNOSIS — R112 Nausea with vomiting, unspecified: Secondary | ICD-10-CM | POA: Diagnosis not present

## 2021-10-02 DIAGNOSIS — R109 Unspecified abdominal pain: Secondary | ICD-10-CM | POA: Diagnosis present

## 2021-10-02 DIAGNOSIS — F17210 Nicotine dependence, cigarettes, uncomplicated: Secondary | ICD-10-CM | POA: Insufficient documentation

## 2021-10-02 DIAGNOSIS — E119 Type 2 diabetes mellitus without complications: Secondary | ICD-10-CM | POA: Insufficient documentation

## 2021-10-02 DIAGNOSIS — R1084 Generalized abdominal pain: Secondary | ICD-10-CM

## 2021-10-02 LAB — URINALYSIS, ROUTINE W REFLEX MICROSCOPIC
Bilirubin Urine: NEGATIVE
Glucose, UA: NEGATIVE mg/dL
Hgb urine dipstick: NEGATIVE
Ketones, ur: NEGATIVE mg/dL
Leukocytes,Ua: NEGATIVE
Nitrite: NEGATIVE
Protein, ur: NEGATIVE mg/dL
Specific Gravity, Urine: 1.015 (ref 1.005–1.030)
pH: 7 (ref 5.0–8.0)

## 2021-10-02 LAB — CBC WITH DIFFERENTIAL/PLATELET
Abs Immature Granulocytes: 0.01 10*3/uL (ref 0.00–0.07)
Basophils Absolute: 0 10*3/uL (ref 0.0–0.1)
Basophils Relative: 1 %
Eosinophils Absolute: 0.1 10*3/uL (ref 0.0–0.5)
Eosinophils Relative: 2 %
HCT: 41.3 % (ref 36.0–46.0)
Hemoglobin: 13.5 g/dL (ref 12.0–15.0)
Immature Granulocytes: 0 %
Lymphocytes Relative: 21 %
Lymphs Abs: 1.2 10*3/uL (ref 0.7–4.0)
MCH: 27.3 pg (ref 26.0–34.0)
MCHC: 32.7 g/dL (ref 30.0–36.0)
MCV: 83.4 fL (ref 80.0–100.0)
Monocytes Absolute: 0.3 10*3/uL (ref 0.1–1.0)
Monocytes Relative: 6 %
Neutro Abs: 4.1 10*3/uL (ref 1.7–7.7)
Neutrophils Relative %: 70 %
Platelets: 321 10*3/uL (ref 150–400)
RBC: 4.95 MIL/uL (ref 3.87–5.11)
RDW: 14.5 % (ref 11.5–15.5)
WBC: 5.8 10*3/uL (ref 4.0–10.5)
nRBC: 0 % (ref 0.0–0.2)

## 2021-10-02 LAB — COMPREHENSIVE METABOLIC PANEL
ALT: 12 U/L (ref 0–44)
AST: 14 U/L — ABNORMAL LOW (ref 15–41)
Albumin: 4 g/dL (ref 3.5–5.0)
Alkaline Phosphatase: 70 U/L (ref 38–126)
Anion gap: 10 (ref 5–15)
BUN: 11 mg/dL (ref 6–20)
CO2: 27 mmol/L (ref 22–32)
Calcium: 9.1 mg/dL (ref 8.9–10.3)
Chloride: 100 mmol/L (ref 98–111)
Creatinine, Ser: 0.65 mg/dL (ref 0.44–1.00)
GFR, Estimated: >60 mL/min (ref >60)
Glucose, Bld: 131 mg/dL — ABNORMAL HIGH (ref 70–99)
Potassium: 2.8 mmol/L — ABNORMAL LOW (ref 3.5–5.1)
Sodium: 137 mmol/L (ref 135–145)
Total Bilirubin: 0.7 mg/dL (ref 0.3–1.2)
Total Protein: 8 g/dL (ref 6.5–8.1)

## 2021-10-02 LAB — HCG, SERUM, QUALITATIVE: Preg, Serum: NEGATIVE

## 2021-10-02 LAB — LIPASE, BLOOD: Lipase: 29 U/L (ref 11–51)

## 2021-10-02 MED ORDER — POTASSIUM CHLORIDE CRYS ER 20 MEQ PO TBCR: 40.0000 meq | EXTENDED_RELEASE_TABLET | Freq: Every day | ORAL | 0 refills | Status: DC

## 2021-10-02 MED ORDER — SODIUM CHLORIDE 0.9 % IV BOLUS
1000.0000 mL | Freq: Once | INTRAVENOUS | Status: AC
  Administered 2021-10-02: 1000 mL via INTRAVENOUS

## 2021-10-02 MED ORDER — ONDANSETRON HCL 4 MG/2ML IJ SOLN
4.0000 mg | Freq: Once | INTRAMUSCULAR | Status: AC
  Administered 2021-10-02: 4 mg via INTRAVENOUS
  Filled 2021-10-02: qty 2

## 2021-10-02 MED ORDER — HYDROMORPHONE HCL 1 MG/ML IJ SOLN
0.5000 mg | Freq: Once | INTRAMUSCULAR | Status: AC
  Administered 2021-10-02: 0.5 mg via INTRAVENOUS

## 2021-10-02 MED ORDER — PANTOPRAZOLE SODIUM 20 MG PO TBEC: 20.0000 mg | DELAYED_RELEASE_TABLET | Freq: Every day | ORAL | 0 refills | Status: DC

## 2021-10-02 MED ORDER — DIPHENHYDRAMINE HCL 50 MG/ML IJ SOLN
12.5000 mg | Freq: Once | INTRAMUSCULAR | Status: AC
  Administered 2021-10-02: 12.5 mg via INTRAVENOUS
  Filled 2021-10-02: qty 1

## 2021-10-02 MED ORDER — HYDROMORPHONE HCL 1 MG/ML IJ SOLN
INTRAMUSCULAR | Status: AC
  Filled 2021-10-02: qty 1

## 2021-10-02 MED ORDER — HYDROMORPHONE HCL 1 MG/ML PO LIQD: 1.0000 mg | Freq: Once | ORAL | Status: DC

## 2021-10-02 MED ORDER — DICYCLOMINE HCL 10 MG PO CAPS
10.0000 mg | ORAL_CAPSULE | Freq: Once | ORAL | Status: AC
  Administered 2021-10-02: 10 mg via ORAL
  Filled 2021-10-02: qty 1

## 2021-10-02 MED ORDER — HYDROMORPHONE HCL 2 MG PO TABS
1.0000 mg | ORAL_TABLET | Freq: Once | ORAL | Status: DC
  Filled 2021-10-02: qty 1

## 2021-10-02 MED ORDER — DICYCLOMINE HCL 20 MG PO TABS: 20.0000 mg | ORAL_TABLET | Freq: Two times a day (BID) | ORAL | 0 refills | Status: DC

## 2021-10-02 NOTE — Discharge Instructions (Addendum)
Return to ED with any new or worsening symptoms such as dizziness, lightheadedness, weakness, gross blood in stool You can pick up your prescription medications that we discussed at your local pharmacy that I sent them into.  I have sent you in Protonix for your GI upset, Bentyl for your GI upset, potassium supplementation pills for your low potassium levels.  You will need to take these potassium pills and used to get them. Follow-up with the GI specialist in the next 2 to 3 weeks as discussed.

## 2021-10-02 NOTE — ED Triage Notes (Signed)
Pt reports abdominal pain beginning 2 weeks ago, has had multiple doctor visits for same.  Reports diarrhea with blood in stool.  Reports constant nausea.

## 2021-10-02 NOTE — Progress Notes (Signed)
CT waiting on hcg results.

## 2021-10-02 NOTE — ED Provider Notes (Signed)
[facility] Provider Note   CSN: [id number] Arrival date & time: 10/02/21  1622     History Chief Complaint  Patient presents with   Abdominal Pain    Jodi Mcgee is a 35 y.o. female with medical history significant for anemia, anxiety, DM, GERD, IBS, ovarian cyst.  Patient returns to ED for ongoing abdominal pain.  Patient was recently seen on 12/6 for the same complaint with a largely unremarkable work-up.  Patient also seen on 11/28 and diagnosed with colitis.  Patient prescribed Augmentin and Zofran and given narcotics for breakthrough pain.  Patient returns with ongoing abdominal pain located diffusely throughout the abdomen that she states radiates to her back.  Patient states that she has had blood in her stool for the last 3 days. Patient denies any urinary symptoms. Patient denies fever. Patient endorses nausea and vomiting.    Abdominal Pain Associated symptoms: nausea and vomiting   Associated symptoms: no chest pain, no fever, no hematuria, no shortness of breath and no vaginal bleeding       Past Medical History:  Diagnosis Date   Anemia    Anxiety    Blood transfusion without reported diagnosis    both CS   Cocaine abuse ([place])    Diabetes mellitus without complication ([place])    GERD (gastroesophageal reflux disease)    has resolved   IBS (irritable bowel syndrome)    Ovarian cyst    Peritonitis, acute generalized ([place])    Pulmonary embolism ([facility])    SBO (small bowel obstruction) ([place])    Sepsis ([place])     Patient Active Problem List   Diagnosis Date Noted   Hypokalemia 03/22/2021   GERD (gastroesophageal reflux disease) 03/22/2021   Pyelonephritis 03/21/2021   Controlled type 2 diabetes mellitus with hyperglycemia ([place]) 05/10/2020   Partial small bowel obstruction ([place]) 05/09/2020   Bartholin's gland abscess 02/21/2019   DKA (diabetic ketoacidoses) 02/20/2019   Blood per rectum 02/20/2019   Hyponatremia 09/26/2018    Chronic hypertension 01/03/2018   IUD contraception 01/03/2018   Pulmonary embolism during puerperium 12/04/2017   Pulmonary edema 11/30/2017   Pelvic adhesive disease 10/22/2017   Nasal septal perforation 10/17/2017   History of cocaine abuse ([place]) 10/17/2017   Epistaxis 10/17/2017   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   Obesity (BMI 30-39.9) 09/25/2017   Type 2 diabetes mellitus, without long-term current use of insulin ([place]) 08/23/2017   Chronic pain syndrome 08/23/2017    Past Surgical History:  Procedure Laterality Date   APPENDECTOMY     ruptured    BUNIONECTOMY     CESAREAN SECTION     CESAREAN SECTION Bilateral 11/20/2017   Procedure: CESAREAN SECTION;  Surgeon: Sloan Leiter, MD;  Location: [facility];  Service: Obstetrics;  Laterality: Bilateral;   OVARIAN CYST DRAINAGE     SMALL BOWEL REPAIR       OB History     Gravida  3   Para  3   Term  1   Preterm  2   AB  0   Living  3      SAB  0   IAB  0   Ectopic  0   Multiple  0   Live Births  3           Family History  Problem Relation Age of Onset   Hypertension Mother    Anemia Mother    Diabetes Father     Social  History   Tobacco Use   Smoking status: Every Day    Packs/day: 0.15    Types: Cigarettes   Smokeless tobacco: Never   Tobacco comments:    social use with tobacco  Vaping Use   Vaping Use: Former  Substance Use Topics   Alcohol use: Not Currently    Comment: occ   Drug use: Not Currently    Home Medications Prior to Admission medications   Medication Sig Start Date End Date Taking? Authorizing Provider  dicyclomine (BENTYL) 20 MG tablet Take 1 tablet (20 mg total) by mouth 2 (two) times daily. 10/02/21  Yes Azucena Cecil, PA-C  pantoprazole (PROTONIX) 20 MG tablet Take 1 tablet (20 mg total) by mouth daily. 10/02/21  Yes Genevive Bi F, PA-C  potassium chloride SA (KLOR-CON M) 20 MEQ tablet Take 2 tablets (40 mEq total) by mouth daily.  10/02/21  Yes Azucena Cecil, PA-C  amoxicillin-clavulanate (AUGMENTIN) 875-125 MG tablet Take 1 tablet by mouth every 12 (twelve) hours. 09/19/21   Curatolo, Adam, DO  blood glucose meter kit and supplies KIT Dispense based on patient and insurance preference. Use up to four times daily as directed. (FOR ICD-9 250.00, 250.01). 02/21/19   Oretha Milch D, MD  cephALEXin (KEFLEX) 500 MG capsule Take 1 capsule (500 mg total) by mouth 4 (four) times daily. 09/04/21   Mesner, Corene Cornea, MD  cyclobenzaprine (FLEXERIL) 10 MG tablet Take 1 tablet (10 mg total) by mouth 3 (three) times daily as needed for muscle spasms. 09/17/21   Veryl Speak, MD  HUMULIN R 100 UNIT/ML injection Inject 20 Units into the skin 3 (three) times daily before meals. Per sliding scale 03/24/20   [provider]  hyoscyamine (LEVSIN SL) 0.125 MG SL tablet Place 1 tablet (0.125 mg total) under the tongue every 4 (four) hours as needed for cramping (abdominal pain). 04/15/21   Orpah Greek, MD  insulin detemir (LEVEMIR) 100 unit/ml SOLN Inject 20 Units into the skin 3 (three) times daily. 03/02/21   Matilde Haymaker, MD  Levonorgestrel (LILETTA) 19.5 MCG/DAY IUD IUD 1 each by Intrauterine route continuous. Placed in 2019    [provider]  loperamide (IMODIUM) 2 MG capsule Take 1 capsule (2 mg total) by mouth 4 (four) times daily as needed for diarrhea or loose stools. 04/08/21   Tedd Sias, PA  ondansetron (ZOFRAN ODT) 4 MG disintegrating tablet 75m ODT q4 hours prn nausea/vomit 09/04/21   Mesner, JCorene Cornea MD  ondansetron (ZOFRAN) 4 MG tablet Take 1 tablet (4 mg total) by mouth every 6 (six) hours. 09/19/21   Curatolo, Adam, DO  oseltamivir (TAMIFLU) 75 MG capsule Take 1 capsule (75 mg total) by mouth every 12 (twelve) hours. 09/05/21   LDoristine Devoid PA-C  oxyCODONE-acetaminophen (PERCOCET) 5-325 MG tablet Take 1 tablet by mouth every 8 (eight) hours as needed for up to 10 doses for severe pain.  09/19/21   Curatolo, Adam, DO  polyethylene glycol (MIRALAX / GLYCOLAX) 17 g packet Take 17 g by mouth daily. 03/26/21   Arrien, MJimmy Picket MD  potassium chloride (KLOR-CON) 10 MEQ tablet Take 1 tablet (10 mEq total) by mouth daily. 04/08/21   FTedd Sias PA  potassium chloride (KLOR-CON) 10 MEQ tablet Take 1 tablet (10 mEq total) by mouth 2 (two) times daily. 04/25/21   Palumbo, April, MD  tizanidine (ZANAFLEX) 2 MG capsule Take 1 capsule (2 mg total) by mouth 3 (three) times daily as needed for up to 30  doses for muscle spasms. 08/13/21   Curatolo, Adam, DO    Allergies    Hydrocodone, Iodinated diagnostic agents, Cetirizine & related, Toradol [ketorolac tromethamine], Flagyl [metronidazole], Keflex [cephalexin], and Nsaids  Review of Systems   Review of Systems  Constitutional:  Negative for fever.  Respiratory:  Negative for shortness of breath.   Cardiovascular:  Negative for chest pain.  Gastrointestinal:  Positive for abdominal pain, blood in stool, nausea and vomiting.  Genitourinary:  Negative for hematuria and vaginal bleeding.  Neurological:  Negative for dizziness, syncope, weakness and light-headedness.  All other systems reviewed and are negative.  Physical Exam Updated Vital Signs BP (!) 151/104   Pulse 98   Temp 98.3 F (36.8 C) (Oral)   Resp 13   Ht '5\' 7"'  (1.702 m)   Wt 59 kg   SpO2 94%   BMI 20.36 kg/m   Physical Exam Constitutional:      General: She is in acute distress.     Appearance: She is normal weight. She is not toxic-appearing.  HENT:     Head: Normocephalic.  Eyes:     Extraocular Movements: Extraocular movements intact.  Cardiovascular:     Rate and Rhythm: Normal rate and regular rhythm.  Pulmonary:     Effort: Pulmonary effort is normal.     Breath sounds: Normal breath sounds. No wheezing.  Abdominal:     General: Abdomen is flat. Bowel sounds are normal.     Palpations: Abdomen is soft.     Tenderness: There is generalized  abdominal tenderness. There is right CVA tenderness and left CVA tenderness.  Skin:    General: Skin is warm and dry.     Capillary Refill: Capillary refill takes less than 2 seconds.  Neurological:     General: No focal deficit present.     Mental Status: She is alert.    ED Results / Procedures / Treatments   Labs (all labs ordered are listed, but only abnormal results are displayed) Labs Reviewed  COMPREHENSIVE METABOLIC PANEL - Abnormal; Notable for the following components:      Result Value   Potassium 2.8 (*)    Glucose, Bld 131 (*)    AST 14 (*)    All other components within normal limits  CBC WITH DIFFERENTIAL/PLATELET  LIPASE, BLOOD  URINALYSIS, ROUTINE W REFLEX MICROSCOPIC  HCG, SERUM, QUALITATIVE    EKG None  Radiology CT ABDOMEN PELVIS WO CONTRAST  Result Date: 10/02/2021 CLINICAL DATA:  Abdominal pain, diarrhea, nausea, blood in stool EXAM: CT ABDOMEN AND PELVIS WITHOUT CONTRAST TECHNIQUE: Multidetector CT imaging of the abdomen and pelvis was performed following the standard protocol without IV contrast. COMPARISON:  09/27/2021, 09/19/2021, and 09/03/2021 FINDINGS: Lower chest: Lung bases are clear. Hepatobiliary: Unenhanced liver is unremarkable. Gallbladder is unremarkable. No intrahepatic or extrahepatic ductal dilatation. Pancreas: Within normal limits. Spleen: Within normal limits. Adrenals/Urinary Tract: 2.1 cm right adrenal adenoma, benign. Left adrenal gland is within normal limits. Kidneys are within normal limits. No renal calculi or hydronephrosis. Bladder is within normal limits. Stomach/Bowel: Stomach is within normal limits. No evidence of bowel obstruction. Appendix is not discretely visualized. No bowel wall thickening or inflammatory changes. Vascular/Lymphatic: No evidence of abdominal aortic aneurysm. No suspicious abdominopelvic lymphadenopathy. Reproductive: Uterus is notable for an IUD in satisfactory position. No adnexal masses. Other: No  abdominopelvic ascites. Musculoskeletal: Visualized osseous structures are within normal limits. IMPRESSION: No interval change from recent CT. No bowel wall thickening or inflammatory changes. Additional stable ancillary  findings as above. Electronically Signed   By: Julian Hy M.D.   On: 10/02/2021 19:02    Procedures Procedures   Medications Ordered in ED Medications  diphenhydrAMINE (BENADRYL) injection 12.5 mg (12.5 mg Intravenous Given 10/02/21 1706)  sodium chloride 0.9 % bolus 1,000 mL (0 mLs Intravenous Stopped 10/02/21 2021)  ondansetron (ZOFRAN) injection 4 mg (4 mg Intravenous Given 10/02/21 1706)  HYDROmorphone (DILAUDID) injection 0.5 mg (0 mg Intravenous Not Given 10/02/21 1744)  dicyclomine (BENTYL) capsule 10 mg (10 mg Oral Given 10/02/21 2141)    ED Course  I have reviewed the triage vital signs and the nursing notes.  Pertinent labs & imaging results that were available during my care of the patient were reviewed by me and considered in my medical decision making (see chart for details).    MDM Rules/Calculators/A&P                          35 year old female presents for abdominal pain that began on 11/26.  Patient has been seen 2 previous times for same complaint.  Once on 11/28 is another time on 12/06.  On examination, patient is nonhypoxic, nontoxic-appearing, speaking full sentences, sitting upright in bed complaining of pain.  This patient's work-up included: Urinalysis, lipase, CMP, CBC, CT abdomen pelvis without contrast.  Patient UA unremarkable Patient lipase within normal limits Patient CMP with decreased potassium to 2.8.  I will prescribe this patient potassium repletion which she can pick up at her local pharmacy. Patient CBC unremarkable.  No elevated white count.  No decrease in hemoglobin.  Patient globin is a 13.5.  She denies dizziness, lightheadedness, weakness. Patient CT abdomen pelvis without contrast does not identify any cause of GI  bleeding or diffuse abdominal pain at this time.  When I went back into the patient's room to discuss her results with her, she was sleeping peacefully.  I discussed with this patient if she had GI follow-up which she confirmed that she did.  She states that she has an appointment in early January.  At this time this patient seems stable for discharge.  There is no identifiable cause of her abdominal pain, patient is euvolemic, not hypoxic.  I discussed this patient with Dr. Laverta Baltimore and he is in agreement with discharge and outpatient follow-up.  I have provided this patient with return precautions such as dizziness, lightheadedness, weakness, gross blood in stool.  She expresses understanding of these discharge instructions.  She states that she will follow-up with her GI specialist soon.  She is agreeable for discharge.  Patient stable on discharge  Final Clinical Impression(s) / ED Diagnoses Final diagnoses:  Generalized abdominal pain    Rx / DC Orders ED Discharge Orders          Ordered    dicyclomine (BENTYL) 20 MG tablet  2 times daily        10/02/21 2246    pantoprazole (PROTONIX) 20 MG tablet  Daily        10/02/21 2246    potassium chloride SA (KLOR-CON M) 20 MEQ tablet  Daily        10/02/21 2246             Azucena Cecil, PA-C 10/02/21 2317    Margette Fast, MD 10/06/21 [phone]

## 2021-10-22 ENCOUNTER — Inpatient Hospital Stay (HOSPITAL_BASED_OUTPATIENT_CLINIC_OR_DEPARTMENT_OTHER)
Admission: EM | Admit: 2021-10-22 | Discharge: 2021-10-28 | DRG: 392 | Disposition: A | Discharge status: Home / Self Care | Payer: Medicaid Other | Attending: Family Medicine | Admitting: Family Medicine

## 2021-10-22 ENCOUNTER — Encounter (HOSPITAL_BASED_OUTPATIENT_CLINIC_OR_DEPARTMENT_OTHER): Payer: Self-pay | Admitting: Emergency Medicine

## 2021-10-22 ENCOUNTER — Other Ambulatory Visit: Payer: Self-pay

## 2021-10-22 DIAGNOSIS — Z886 Allergy status to analgesic agent status: Secondary | ICD-10-CM

## 2021-10-22 DIAGNOSIS — E11649 Type 2 diabetes mellitus with hypoglycemia without coma: Secondary | ICD-10-CM | POA: Diagnosis not present

## 2021-10-22 DIAGNOSIS — Z888 Allergy status to other drugs, medicaments and biological substances status: Secondary | ICD-10-CM | POA: Diagnosis not present

## 2021-10-22 DIAGNOSIS — R112 Nausea with vomiting, unspecified: Secondary | ICD-10-CM | POA: Diagnosis not present

## 2021-10-22 DIAGNOSIS — Z20822 Contact with and (suspected) exposure to covid-19: Secondary | ICD-10-CM | POA: Diagnosis present

## 2021-10-22 DIAGNOSIS — R197 Diarrhea, unspecified: Secondary | ICD-10-CM | POA: Diagnosis not present

## 2021-10-22 DIAGNOSIS — F17210 Nicotine dependence, cigarettes, uncomplicated: Secondary | ICD-10-CM | POA: Diagnosis present

## 2021-10-22 DIAGNOSIS — K580 Irritable bowel syndrome with diarrhea: Secondary | ICD-10-CM | POA: Diagnosis present

## 2021-10-22 DIAGNOSIS — R109 Unspecified abdominal pain: Secondary | ICD-10-CM

## 2021-10-22 DIAGNOSIS — K921 Melena: Secondary | ICD-10-CM

## 2021-10-22 DIAGNOSIS — K640 First degree hemorrhoids: Secondary | ICD-10-CM | POA: Diagnosis present

## 2021-10-22 DIAGNOSIS — Z91041 Radiographic dye allergy status: Secondary | ICD-10-CM | POA: Diagnosis not present

## 2021-10-22 DIAGNOSIS — Z79899 Other long term (current) drug therapy: Secondary | ICD-10-CM

## 2021-10-22 DIAGNOSIS — E876 Hypokalemia: Secondary | ICD-10-CM | POA: Diagnosis not present

## 2021-10-22 DIAGNOSIS — E119 Type 2 diabetes mellitus without complications: Secondary | ICD-10-CM | POA: Diagnosis not present

## 2021-10-22 DIAGNOSIS — F1410 Cocaine abuse, uncomplicated: Secondary | ICD-10-CM | POA: Diagnosis present

## 2021-10-22 DIAGNOSIS — Z8249 Family history of ischemic heart disease and other diseases of the circulatory system: Secondary | ICD-10-CM

## 2021-10-22 DIAGNOSIS — N390 Urinary tract infection, site not specified: Secondary | ICD-10-CM | POA: Diagnosis present

## 2021-10-22 DIAGNOSIS — Z885 Allergy status to narcotic agent status: Secondary | ICD-10-CM

## 2021-10-22 DIAGNOSIS — K219 Gastro-esophageal reflux disease without esophagitis: Secondary | ICD-10-CM | POA: Diagnosis present

## 2021-10-22 DIAGNOSIS — Z86711 Personal history of pulmonary embolism: Secondary | ICD-10-CM | POA: Diagnosis not present

## 2021-10-22 DIAGNOSIS — R1032 Left lower quadrant pain: Secondary | ICD-10-CM

## 2021-10-22 DIAGNOSIS — A09 Infectious gastroenteritis and colitis, unspecified: Secondary | ICD-10-CM | POA: Diagnosis not present

## 2021-10-22 DIAGNOSIS — K529 Noninfective gastroenteritis and colitis, unspecified: Secondary | ICD-10-CM | POA: Diagnosis not present

## 2021-10-22 DIAGNOSIS — Z8379 Family history of other diseases of the digestive system: Secondary | ICD-10-CM | POA: Diagnosis not present

## 2021-10-22 DIAGNOSIS — Z794 Long term (current) use of insulin: Secondary | ICD-10-CM

## 2021-10-22 DIAGNOSIS — Z833 Family history of diabetes mellitus: Secondary | ICD-10-CM | POA: Diagnosis not present

## 2021-10-22 DIAGNOSIS — G8929 Other chronic pain: Secondary | ICD-10-CM

## 2021-10-22 DIAGNOSIS — F1411 Cocaine abuse, in remission: Secondary | ICD-10-CM | POA: Diagnosis not present

## 2021-10-22 DIAGNOSIS — G894 Chronic pain syndrome: Secondary | ICD-10-CM | POA: Diagnosis present

## 2021-10-22 DIAGNOSIS — I10 Essential (primary) hypertension: Secondary | ICD-10-CM | POA: Diagnosis present

## 2021-10-22 LAB — C DIFFICILE QUICK SCREEN W PCR REFLEX
C Diff antigen: NEGATIVE
C Diff interpretation: NOT DETECTED
C Diff toxin: NEGATIVE

## 2021-10-22 LAB — CBC WITH DIFFERENTIAL/PLATELET
Abs Immature Granulocytes: 0.02 10*3/uL (ref 0.00–0.07)
Basophils Absolute: 0 10*3/uL (ref 0.0–0.1)
Basophils Relative: 1 %
Eosinophils Absolute: 0.1 10*3/uL (ref 0.0–0.5)
Eosinophils Relative: 1 %
HCT: 38.3 % (ref 36.0–46.0)
Hemoglobin: 12.5 g/dL (ref 12.0–15.0)
Immature Granulocytes: 0 %
Lymphocytes Relative: 18 %
Lymphs Abs: 1.3 10*3/uL (ref 0.7–4.0)
MCH: 27.2 pg (ref 26.0–34.0)
MCHC: 32.6 g/dL (ref 30.0–36.0)
MCV: 83.3 fL (ref 80.0–100.0)
Monocytes Absolute: 0.4 10*3/uL (ref 0.1–1.0)
Monocytes Relative: 5 %
Neutro Abs: 5.4 10*3/uL (ref 1.7–7.7)
Neutrophils Relative %: 75 %
Platelets: 426 10*3/uL — ABNORMAL HIGH (ref 150–400)
RBC: 4.6 MIL/uL (ref 3.87–5.11)
RDW: 14.1 % (ref 11.5–15.5)
WBC: 7.2 10*3/uL (ref 4.0–10.5)
nRBC: 0 % (ref 0.0–0.2)

## 2021-10-22 LAB — COMPREHENSIVE METABOLIC PANEL
ALT: 10 U/L (ref 0–44)
AST: 10 U/L — ABNORMAL LOW (ref 15–41)
Albumin: 3.1 g/dL — ABNORMAL LOW (ref 3.5–5.0)
Alkaline Phosphatase: 64 U/L (ref 38–126)
Anion gap: 5 (ref 5–15)
BUN: 6 mg/dL (ref 6–20)
CO2: 27 mmol/L (ref 22–32)
Calcium: 8.3 mg/dL — ABNORMAL LOW (ref 8.9–10.3)
Chloride: 106 mmol/L (ref 98–111)
Creatinine, Ser: 0.57 mg/dL (ref 0.44–1.00)
GFR, Estimated: >60 mL/min (ref >60)
Glucose, Bld: 165 mg/dL — ABNORMAL HIGH (ref 70–99)
Potassium: 3.2 mmol/L — ABNORMAL LOW (ref 3.5–5.1)
Sodium: 138 mmol/L (ref 135–145)
Total Bilirubin: 0.5 mg/dL (ref 0.3–1.2)
Total Protein: 7 g/dL (ref 6.5–8.1)

## 2021-10-22 LAB — CBC
HCT: 37 % (ref 36.0–46.0)
Hemoglobin: 11.9 g/dL — ABNORMAL LOW (ref 12.0–15.0)
MCH: 27.5 pg (ref 26.0–34.0)
MCHC: 32.2 g/dL (ref 30.0–36.0)
MCV: 85.6 fL (ref 80.0–100.0)
Platelets: 301 10*3/uL (ref 150–400)
RBC: 4.32 MIL/uL (ref 3.87–5.11)
RDW: 13.9 % (ref 11.5–15.5)
WBC: 4.6 10*3/uL (ref 4.0–10.5)
nRBC: 0 % (ref 0.0–0.2)

## 2021-10-22 LAB — BASIC METABOLIC PANEL
Anion gap: 8 (ref 5–15)
BUN: 10 mg/dL (ref 6–20)
CO2: 30 mmol/L (ref 22–32)
Calcium: 8.7 mg/dL — ABNORMAL LOW (ref 8.9–10.3)
Chloride: 98 mmol/L (ref 98–111)
Creatinine, Ser: 0.62 mg/dL (ref 0.44–1.00)
GFR, Estimated: >60 mL/min (ref >60)
Glucose, Bld: 166 mg/dL — ABNORMAL HIGH (ref 70–99)
Potassium: 2.5 mmol/L — CL (ref 3.5–5.1)
Sodium: 136 mmol/L (ref 135–145)

## 2021-10-22 LAB — URINALYSIS, ROUTINE W REFLEX MICROSCOPIC
Bilirubin Urine: NEGATIVE
Glucose, UA: NEGATIVE mg/dL
Hgb urine dipstick: NEGATIVE
Ketones, ur: NEGATIVE mg/dL
Nitrite: POSITIVE — AB
Protein, ur: NEGATIVE mg/dL
Specific Gravity, Urine: 1.015 (ref 1.005–1.030)
pH: 6 (ref 5.0–8.0)

## 2021-10-22 LAB — URINALYSIS, MICROSCOPIC (REFLEX)

## 2021-10-22 LAB — MAGNESIUM: Magnesium: 1.9 mg/dL (ref 1.7–2.4)

## 2021-10-22 LAB — HCG, SERUM, QUALITATIVE: Preg, Serum: NEGATIVE

## 2021-10-22 LAB — RESP PANEL BY RT-PCR (FLU A&B, COVID) ARPGX2
Influenza A by PCR: NEGATIVE
Influenza B by PCR: NEGATIVE
SARS Coronavirus 2 by RT PCR: NEGATIVE

## 2021-10-22 LAB — CK: Total CK: 39 U/L (ref 38–234)

## 2021-10-22 LAB — LIPASE, BLOOD: Lipase: 31 U/L (ref 11–51)

## 2021-10-22 LAB — OCCULT BLOOD X 1 CARD TO LAB, STOOL: Fecal Occult Bld: POSITIVE — AB

## 2021-10-22 LAB — PHOSPHORUS: Phosphorus: 3.3 mg/dL (ref 2.5–4.6)

## 2021-10-22 MED ORDER — DIPHENHYDRAMINE HCL 50 MG/ML IJ SOLN
25.0000 mg | Freq: Once | INTRAMUSCULAR | Status: AC
  Administered 2021-10-22: 25 mg via INTRAVENOUS
  Filled 2021-10-22: qty 1

## 2021-10-22 MED ORDER — INSULIN ASPART 100 UNIT/ML IJ SOLN
0.0000 [IU] | INTRAMUSCULAR | Status: DC
  Administered 2021-10-23: 3 [IU] via SUBCUTANEOUS
  Administered 2021-10-24 – 2021-10-25 (×4): 1 [IU] via SUBCUTANEOUS

## 2021-10-22 MED ORDER — ONDANSETRON HCL 4 MG/2ML IJ SOLN
4.0000 mg | Freq: Once | INTRAMUSCULAR | Status: AC
  Administered 2021-10-22: 4 mg via INTRAVENOUS
  Filled 2021-10-22: qty 2

## 2021-10-22 MED ORDER — LACTATED RINGERS IV BOLUS
1000.0000 mL | Freq: Once | INTRAVENOUS | Status: AC
  Administered 2021-10-22: 1000 mL via INTRAVENOUS

## 2021-10-22 MED ORDER — POTASSIUM CHLORIDE 10 MEQ/100ML IV SOLN
10.0000 meq | INTRAVENOUS | Status: AC
  Administered 2021-10-22 (×4): 10 meq via INTRAVENOUS
  Filled 2021-10-22 (×4): qty 100

## 2021-10-22 MED ORDER — POTASSIUM CHLORIDE CRYS ER 20 MEQ PO TBCR
40.0000 meq | EXTENDED_RELEASE_TABLET | Freq: Once | ORAL | Status: AC
  Administered 2021-10-22: 40 meq via ORAL
  Filled 2021-10-22: qty 2

## 2021-10-22 MED ORDER — POTASSIUM CHLORIDE 10 MEQ/100ML IV SOLN
10.0000 meq | INTRAVENOUS | Status: AC
  Administered 2021-10-23 (×4): 10 meq via INTRAVENOUS
  Filled 2021-10-22 (×4): qty 100

## 2021-10-22 MED ORDER — HYDROMORPHONE HCL 1 MG/ML IJ SOLN
0.5000 mg | INTRAMUSCULAR | Status: DC | PRN
  Administered 2021-10-22 – 2021-10-26 (×28): 0.5 mg via INTRAVENOUS
  Filled 2021-10-22 (×2): qty 0.5
  Filled 2021-10-22 (×2): qty 1
  Filled 2021-10-22 (×17): qty 0.5
  Filled 2021-10-22: qty 1
  Filled 2021-10-22 (×6): qty 0.5

## 2021-10-22 MED ORDER — SODIUM CHLORIDE 0.9 % IV SOLN
1.0000 g | INTRAVENOUS | Status: DC
  Administered 2021-10-22: 1 g via INTRAVENOUS
  Filled 2021-10-22 (×2): qty 10

## 2021-10-22 MED ORDER — SODIUM CHLORIDE 0.9 % IV SOLN: Freq: Once | INTRAVENOUS | Status: AC

## 2021-10-22 MED ORDER — FENTANYL CITRATE PF 50 MCG/ML IJ SOSY
50.0000 ug | PREFILLED_SYRINGE | Freq: Once | INTRAMUSCULAR | Status: AC
  Administered 2021-10-22: 50 ug via INTRAVENOUS
  Filled 2021-10-22: qty 1

## 2021-10-22 MED ORDER — DIPHENHYDRAMINE HCL 50 MG/ML IJ SOLN
12.5000 mg | Freq: Once | INTRAMUSCULAR | Status: AC
  Administered 2021-10-22: 12.5 mg via INTRAVENOUS
  Filled 2021-10-22: qty 1

## 2021-10-22 MED ORDER — DICYCLOMINE HCL 10 MG/ML IM SOLN
20.0000 mg | Freq: Once | INTRAMUSCULAR | Status: AC
  Administered 2021-10-22: 20 mg via INTRAMUSCULAR
  Filled 2021-10-22: qty 2

## 2021-10-22 NOTE — ED Notes (Signed)
Notified from lab via phone of critical value of K+ 2.5. Verbalized results back to lab and notified Dr. Florina Ou of lab results.

## 2021-10-22 NOTE — Assessment & Plan Note (Addendum)
Order SSI hold po meds levemir 20 units

## 2021-10-22 NOTE — ED Triage Notes (Addendum)
Abdominal pain, back pain, and uncontrollable loose "jelly-like" gray/yellow stool x 1 month. Hx of colitis. Cannot fu with gastroenterology until after the new year. Recent abx use x 2 weeks.

## 2021-10-22 NOTE — H&P (Signed)
Jodi Mcgee UUV:[id number] DOB: [birth year] DOA: 10/22/2021    PCP: [facility]   Outpatient Specialists:     [facility]    Patient arrived to ER on 10/22/21 at 0213 Referred by Attending Howerter, Ethelda Chick, DO   Patient coming from: home Lives  With family    Chief Complaint:   Chief Complaint  Patient presents with   Abdominal Pain   Diarrhea    HPI: Jodi Mcgee is a 35 y.o. female with medical history significant of chronic pain and anxiety polysubstance abuse diabetes GERD irritable bowel syndrome history of small bowel obstruction and PE    Presented with   abdominal pain  Intermittent blood in her stool for past 2 weeks has been recently diagnosed with colitis and treated with antibiotics.  Augmentin in end of November still continue to have ongoing abdominal pain for the past 1 month.  With frequent visits to emergency department and intermittent bloody stools.  Also continues nausea and vomiting. CT abdomen done on 11 December did not show any evidence of colitis she has GI follow-up in January. Today patient was seen at [facility] she states that her stools was jellylike she continued to have significant abdominal pain which is sharp and cramping in her lower abdomen but also nausea vomiting decreased appetite.   Reports some back pain and pain in the bottom of her abd When she eats her BM and abd pain would get worse Some blood streaks  Occasional tobacco Etoh on occasion Reports cocaine was remote It made her abd pain feel better No fever or chills  Has  vaccinated against COVID    had   flu shot   Initial COVID TEST  NEGATIVE   Lab Results  Component Value Date   SARSCOV2NAA NEGATIVE 10/22/2021   SARSCOV2NAA NEGATIVE 09/19/2021   [place] NEGATIVE 09/05/2021   [place] NEGATIVE 04/08/2021     Regarding pertinent Chronic problems:       DM 2 -  Lab Results  Component Value Date    HGBA1C 12.9 (H) 03/21/2021   on insulin,  20 units of levemir        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  Given IV fluids fentanyl for pain C. difficile negative stool Hemoccult positive GI panel ordered.  Ordered    Following Medications were ordered in ER: Medications  HYDROmorphone (DILAUDID) injection 0.5 mg (0.5 mg Intravenous Given 10/22/21 1907)  lactated ringers bolus 1,000 mL (0 mLs Intravenous Stopped 10/22/21 0709)  ondansetron (ZOFRAN) injection 4 mg (4 mg Intravenous Given 10/22/21 0439)  dicyclomine (BENTYL) injection 20 mg (20 mg Intramuscular Given 10/22/21 0440)  fentaNYL (SUBLIMAZE) injection 50 mcg (50 mcg Intravenous Given 10/22/21 0440)  potassium chloride 10 mEq in 100 mL IVPB (0 mEq Intravenous Stopped 10/22/21 1336)  potassium chloride SA (KLOR-CON M) CR tablet 40 mEq (40 mEq Oral Given 10/22/21 0457)  diphenhydrAMINE (BENADRYL) injection 25 mg (25 mg Intravenous Given 10/22/21 0512)  0.9 %  sodium chloride infusion ( Intravenous New Bag/Given 10/22/21 1519)  diphenhydrAMINE (BENADRYL) injection 25 mg (25 mg Intravenous Given 10/22/21 1205)       ED Triage Vitals  Enc Vitals Group     BP 10/22/21 0230 (!) 170/111     Pulse Rate 10/22/21 0230 (!) 111  Resp 10/22/21 0230 16     Temp 10/22/21 0230 97.9 F (36.6 C)     Temp Source 10/22/21 0230 Oral     SpO2 10/22/21 0230 100 %     Weight 10/22/21 0327 130 lb 1.1 oz (59 kg)     Height 10/22/21 0327 '5\' 7"'  (1.702 m)     Head Circumference --      Peak Flow --      Pain Score 10/22/21 0232 10     Pain Loc --      Pain Edu? --      Excl. in [place]? --   TMAX(24)@     _________________________________________ Significant initial  Findings: Abnormal Labs Reviewed  URINALYSIS, ROUTINE W REFLEX MICROSCOPIC - Abnormal; Notable for the following components:      Result Value   Color, Urine STRAW (*)     APPearance HAZY (*)    Nitrite POSITIVE (*)    Leukocytes,Ua TRACE (*)    All other components within normal limits  CBC WITH DIFFERENTIAL/PLATELET - Abnormal; Notable for the following components:   Platelets 426 (*)    All other components within normal limits  OCCULT BLOOD X 1 CARD TO LAB, STOOL - Abnormal; Notable for the following components:   Fecal Occult Bld POSITIVE (*)    All other components within normal limits  BASIC METABOLIC PANEL - Abnormal; Notable for the following components:   Potassium 2.5 (*)    Glucose, Bld 166 (*)    Calcium 8.7 (*)    All other components within normal limits  URINALYSIS, MICROSCOPIC (REFLEX) - Abnormal; Notable for the following components:   Bacteria, UA MANY (*)    All other components within normal limits     ECG: Ordered Personally reviewed by me showing: HR :  102 Rhythm:  Sinus tachycardia    no evidence of ischemic changes QTC 459    The recent clinical data is shown below. Vitals:   10/22/21 1700 10/22/21 1830 10/22/21 1910 10/22/21 2100  BP: (!) 154/108 (!) 146/102 (!) 155/123 (!) 154/99  Pulse: (!) 102 99  94  Resp: '13 15 19 15  ' Temp:      TempSrc:      SpO2: 99% 96%  97%  Weight:      Height:        WBC     Component Value Date/Time   WBC 7.2 10/22/2021 0305   LYMPHSABS 1.3 10/22/2021 0305   LYMPHSABS 0.9 08/23/2017 1058   LYMPHSABS 1.7 06/26/2014 0116   MONOABS 0.4 10/22/2021 0305   MONOABS 0.4 06/26/2014 0116   EOSABS 0.1 10/22/2021 0305   EOSABS 0.1 08/23/2017 1058   EOSABS 0.1 06/26/2014 0116   BASOSABS 0.0 10/22/2021 0305   BASOSABS 0.0 08/23/2017 1058   BASOSABS 0.0 06/26/2014 0116      UA  evidence of UTI      Urine analysis:    Component Value Date/Time   COLORURINE STRAW (A) 10/22/2021 1036   APPEARANCEUR HAZY (A) 10/22/2021 1036   APPEARANCEUR Clear 06/26/2014 0116   LABSPEC 1.015 10/22/2021 1036   LABSPEC 1.031 06/26/2014 0116   PHURINE 6.0 10/22/2021 1036   GLUCOSEU NEGATIVE  10/22/2021 1036   GLUCOSEU Negative 06/26/2014 0116   HGBUR NEGATIVE 10/22/2021 [place] 10/22/2021 1036   BILIRUBINUR Negative 06/26/2014 0116   KETONESUR NEGATIVE 10/22/2021 1036   PROTEINUR NEGATIVE 10/22/2021 1036   UROBILINOGEN 0.2 05/17/2019 1326   NITRITE POSITIVE (A) 10/22/2021 1036  LEUKOCYTESUR TRACE (A) 10/22/2021 1036   LEUKOCYTESUR Trace 06/26/2014 0116    Results for orders placed or performed during the hospital encounter of 10/22/21  C Difficile Quick Screen w PCR reflex     Status: None   Collection Time: 10/22/21  3:18 AM   Specimen: STOOL  Result Value Ref Range Status   C Diff antigen NEGATIVE NEGATIVE Final   C Diff toxin NEGATIVE NEGATIVE Final   C Diff interpretation No C. difficile detected.  Final    Comment: Performed at [facility], [place] [address] [place], [place] [address]  Resp Panel by RT-PCR (Flu A&B, Covid) Nasopharyngeal Swab     Status: None   Collection Time: 10/22/21  4:51 AM   Specimen: Nasopharyngeal Swab; Nasopharyngeal(NP) swabs in vial transport medium  Result Value Ref Range Status   SARS Coronavirus 2 by RT PCR NEGATIVE NEGATIVE Final         Influenza A by PCR NEGATIVE NEGATIVE Final   Influenza B by PCR NEGATIVE NEGATIVE Final          _______________________________________________ Hospitalist was called for admission for hypokalemia  The following Work up has been ordered so far:  Orders Placed This Encounter  Procedures   Gastrointestinal Panel by PCR , Stool   C Difficile Quick Screen w PCR reflex   Resp Panel by RT-PCR (Flu A&B, Covid) Nasopharyngeal Swab   Lipase, blood   Urinalysis, Routine w reflex microscopic   CBC with Differential   Occult blood card to lab, stool RN will collect   Basic metabolic panel   hCG, serum, qualitative   Urinalysis, Microscopic (reflex)   Diet NPO time specified   Cardiac monitoring   Cardiac monitoring   Consult to hospitalist   EKG 12-Lead   EKG  12-Lead   Admit to Inpatient (patient's expected length of stay will be greater than 2 midnights or inpatient only procedure)     OTHER Significant initial  Findings:  labs showing:    Recent Labs  Lab 10/22/21 0316  NA 136  K 2.5*  CO2 30  GLUCOSE 166*  BUN 10  CREATININE 0.62  CALCIUM 8.7*    Cr   stable,    Lab Results  Component Value Date   CREATININE 0.62 10/22/2021   CREATININE 0.65 10/02/2021   CREATININE 0.52 09/27/2021    Recent Labs  Lab 10/22/21 2303  AST 10*  ALT 10  ALKPHOS 64  BILITOT 0.5  PROT 7.0  ALBUMIN 3.1*   Lab Results  Component Value Date   CALCIUM 8.7 (L) 10/22/2021    Plt: Lab Results  Component Value Date   PLT 426 (H) 10/22/2021        Recent Labs  Lab 10/22/21 0305  WBC 7.2  NEUTROABS 5.4  HGB 12.5  HCT 38.3  MCV 83.3  PLT 426*    HG/HCT   stable,     Component Value Date/Time   HGB 11.9 (L) 10/22/2021 2303   HGB 8.8 (L) 11/12/2017 0955   HCT 37.0 10/22/2021 2303   HCT 27.7 (L) 11/12/2017 0955   MCV 85.6 10/22/2021 2303   MCV 80 11/12/2017 0955   MCV 83 06/26/2014 0116      Recent Labs  Lab 10/22/21 0305  LIPASE 31   No results for input(s): AMMONIA in the last 168 hours.    Cardiac Panel (last 3 results) Recent Labs    10/22/21 2303  CKTOTAL 39    DM  labs:  HbA1C: Recent Labs    03/21/21 1823  HGBA1C 12.9*       CBG (last 3)  No results for input(s): GLUCAP in the last 72 hours.        Cultures:    Component Value Date/Time   SDES  09/04/2021 0103    URINE, CLEAN CATCH Performed at [facility], [address], [place], [place] [address]    [facility]  09/04/2021 0103    NONE Performed at [facility], [place], [place] [address]    CULT 20,000 [place] (A) 09/04/2021 0103   REPTSTATUS 09/06/2021 FINAL 09/04/2021 0103     Radiological Exams on Admission: No results  found. _______________________________________________________________________________________________________ Latest  Blood pressure (!) 154/99, pulse 94, temperature 98 F (36.7 C), temperature source Oral, resp. rate 15, height '5\' 7"'  (1.702 m), weight 59 kg, SpO2 97 %.   Vitals  labs and radiology finding personally reviewed  Review of Systems:    Pertinent positives include:   fatigue, abdominal pain, nausea, vomiting, diarrhea,  Constitutional:  No weight loss, night sweats, Fevers, chills,weight loss  HEENT:  No headaches, Difficulty swallowing,Tooth/dental problems,Sore throat,  No sneezing, itching, ear ache, nasal congestion, post nasal drip,  Cardio-vascular:  No chest pain, Orthopnea, PND, anasarca, dizziness, palpitations.no Bilateral lower extremity swelling  GI:  No heartburn, indigestion,  change in bowel habits, loss of appetite, melena, blood in stool, hematemesis Resp:  no shortness of breath at rest. No dyspnea on exertion, No excess mucus, no productive cough, No non-productive cough, No coughing up of blood.No change in color of mucus.No wheezing. Skin:  no rash or lesions. No jaundice GU:  no dysuria, change in color of urine, no urgency or frequency. No straining to urinate.  No flank pain.  Musculoskeletal:  No joint pain or no joint swelling. No decreased range of motion. No back pain.  Psych:  No change in mood or affect. No depression or anxiety. No memory loss.  Neuro: no localizing neurological complaints, no tingling, no weakness, no double vision, no gait abnormality, no slurred speech, no confusion  All systems reviewed and apart from [place] all are negative _______________________________________________________________________________________________ Past Medical History:   Past Medical History:  Diagnosis Date   Anemia    Anxiety    Blood transfusion without reported diagnosis    both CS   Cocaine abuse ([place])    Diabetes mellitus without  complication (HCC)    GERD (gastroesophageal reflux disease)    has resolved   IBS (irritable bowel syndrome)    Ovarian cyst    Peritonitis, acute generalized ([place])    Pulmonary embolism (HCC)    SBO (small bowel obstruction) ([place])    Sepsis ([place])      Past Surgical History:  Procedure Laterality Date   APPENDECTOMY     ruptured    BUNIONECTOMY     CESAREAN SECTION     CESAREAN SECTION Bilateral 11/20/2017   Procedure: CESAREAN SECTION;  Surgeon: Sloan Leiter, MD;  Location: [facility];  Service: Obstetrics;  Laterality: Bilateral;   OVARIAN CYST DRAINAGE     SMALL BOWEL REPAIR      Social History:  Ambulatory   independently       reports that she has been smoking cigarettes. She has been smoking an average of .15 packs per day. She has never used smokeless tobacco. She reports that she does not currently use alcohol. She reports that she does not currently use drugs.  Family History:   Family History  Problem Relation Age of Onset   Hypertension Mother    Anemia Mother    Diabetes Father    ______________________________________________________________________________________________ Allergies: Allergies  Allergen Reactions   Hydrocodone Itching   Iodinated Contrast Media Itching and Other (See Comments)    Mild itching after IV contrast on 06/01/2017No urticaria visible, wheezing, or angioedema  Mild itching after IV contrast on 06/01/2017No urticaria visible, wheezing, or angioedema   Cetirizine & Related Itching and Swelling   Toradol [Ketorolac Tromethamine] Hives and Itching   Flagyl [Metronidazole] Itching and Swelling   Keflex [Cephalexin]    Nsaids Rash     Prior to Admission medications   Medication Sig Start Date End Date Taking? Authorizing Provider  amoxicillin-clavulanate (AUGMENTIN) 875-125 MG tablet Take 1 tablet by mouth every 12 (twelve) hours. 09/19/21   Curatolo, Adam, DO  blood glucose meter kit and supplies KIT Dispense  based on patient and insurance preference. Use up to four times daily as directed. (FOR ICD-9 250.00, 250.01). 02/21/19   Oretha Milch D, MD  cephALEXin (KEFLEX) 500 MG capsule Take 1 capsule (500 mg total) by mouth 4 (four) times daily. 09/04/21   Mesner, Corene Cornea, MD  cyclobenzaprine (FLEXERIL) 10 MG tablet Take 1 tablet (10 mg total) by mouth 3 (three) times daily as needed for muscle spasms. 09/17/21   Veryl Speak, MD  dicyclomine (BENTYL) 20 MG tablet Take 1 tablet (20 mg total) by mouth 2 (two) times daily. 10/02/21   Azucena Cecil, PA-C  HUMULIN R 100 UNIT/ML injection Inject 20 Units into the skin 3 (three) times daily before meals. Per sliding scale 03/24/20   [provider]  hyoscyamine (LEVSIN SL) 0.125 MG SL tablet Place 1 tablet (0.125 mg total) under the tongue every 4 (four) hours as needed for cramping (abdominal pain). 04/15/21   Orpah Greek, MD  insulin detemir (LEVEMIR) 100 unit/ml SOLN Inject 20 Units into the skin 3 (three) times daily. 03/02/21   Matilde Haymaker, MD  Levonorgestrel (LILETTA) 19.5 MCG/DAY IUD IUD 1 each by Intrauterine route continuous. Placed in 2019    [provider]  loperamide (IMODIUM) 2 MG capsule Take 1 capsule (2 mg total) by mouth 4 (four) times daily as needed for diarrhea or loose stools. 04/08/21   Tedd Sias, PA  ondansetron (ZOFRAN ODT) 4 MG disintegrating tablet 8m ODT q4 hours prn nausea/vomit 09/04/21   Mesner, JCorene Cornea MD  ondansetron (ZOFRAN) 4 MG tablet Take 1 tablet (4 mg total) by mouth every 6 (six) hours. 09/19/21   Curatolo, Adam, DO  oseltamivir (TAMIFLU) 75 MG capsule Take 1 capsule (75 mg total) by mouth every 12 (twelve) hours. 09/05/21   LDoristine Devoid PA-C  oxyCODONE-acetaminophen (PERCOCET) 5-325 MG tablet Take 1 tablet by mouth every 8 (eight) hours as needed for up to 10 doses for severe pain. 09/19/21   Curatolo, Adam, DO  pantoprazole (PROTONIX) 20 MG tablet Take 1 tablet (20 mg total) by  mouth daily. 10/02/21   GAzucena Cecil PA-C  polyethylene glycol (MIRALAX / GLYCOLAX) 17 g packet Take 17 g by mouth daily. 03/26/21   Arrien, MJimmy Picket MD  potassium chloride (KLOR-CON) 10 MEQ tablet Take 1 tablet (10 mEq total) by mouth daily. 04/08/21   FTedd Sias PA  potassium chloride (KLOR-CON) 10 MEQ tablet Take 1 tablet (10 mEq total) by mouth 2 (two) times daily. 04/25/21   Palumbo, April, MD  potassium chloride SA (KLOR-CON M) 20  MEQ tablet Take 2 tablets (40 mEq total) by mouth daily. 10/02/21   Azucena Cecil, PA-C  tizanidine (ZANAFLEX) 2 MG capsule Take 1 capsule (2 mg total) by mouth 3 (three) times daily as needed for up to 30 doses for muscle spasms. 08/13/21   Lennice Sites, DO    ___________________________________________________________________________________________________ Physical Exam: Vitals with BMI 10/22/2021 10/22/2021 10/22/2021  Height - - -  Weight - - -  BMI - - -  Systolic 349 179 150  Diastolic 99 569 794  Pulse 94 - 99     1. General:  in No  Acute distress    Chronically ill   -appearing 2. Psychological: Alert and   Oriented 3. Head/ENT:   Dry Mucous Membranes                          Head Non traumatic, neck supple                           Poor Dentition 4. SKIN:    decreased Skin turgor,  Skin clean Dry and I area of induration on left leg 5. Heart: Regular rate and rhythm no  Murmur, no Rub or gallop 6. Lungs:  Clear to auscultation bilaterally, no wheezes or crackles   7. Abdomen: Soft,  lower abd tender, Non distended   obese  bowel sounds present 8. Lower extremities: no clubbing, cyanosis, no  edema 9. Neurologically Grossly intact, moving all 4 extremities equally   10. MSK: Normal range of motion    Chart has been reviewed  ______________________________________________________________________________________________  Assessment/Plan 35 y.o. female with medical history significant of chronic pain and  anxiety polysubstance abuse diabetes GERD irritable bowel syndrome history of small bowel obstruction and PE      Admitted for  abd pain and hypokalemia  Present on Admission:  Hypokalemia  Chronic pain syndrome  Chronic hypertension  History of cocaine abuse (HCC)  LLQ abdominal pain  Blood in stool  Acute lower UTI     Hypokalemia - will replace and repeat in AM,  check magnesium level and replace as needed   Type 2 diabetes mellitus, without long-term current use of insulin (HCC) Order SSI hold po meds levemir 20 units  Chronic pain syndrome Will need to follow-up with pain clinic  History of cocaine abuse ([place]) Check urine tox screen Discussed importance of obstaining  Chronic hypertension Not on home meds  LLQ abdominal pain Seems to be ongoing but now also radiating to the back given possibility of UTI will obtain CT abdomen to rule out any perinephric abscess as well as to evaluate further for any persistent colitis  Blood in stool Moderate persistent Hemoccult positive CBC stable.  Given ongoing diarrhea ongoing abdominal pain and bloody stools would benefit from GI consult  Acute lower UTI Order rocephin has tolerated it in the past we will continue.  Obtain urine culture   Other plan as per orders.  DVT prophylaxis:  SCD     Code Status:    Code Status: Prior FULL CODE   as per patient   I had personally discussed CODE STATUS with patient      Family Communication:   Family not at  Bedside    Disposition Plan:     To home once workup is complete and patient is stable   Following barriers for discharge:  Electrolytes corrected                               Anemia corrected                             Pain controlled with PO medications                               Afebrile, white count improving able to transition to PO antibiotics                             Will need to be able to tolerate PO                                                         Will need consultants to evaluate patient prior to discharge                       Consults called:  sent msg to [facility] gi  Admission status:  ED Disposition     ED Disposition  [place]: [facility] [[id number]]  Level of Care: Telemetry [5]  Admit to tele based on following criteria: Monitor for Ischemic changes  May admit patient to [facility] or [facility] if equivalent level of care is available:: No  Interfacility transfer: Yes  Covid Evaluation: Asymptomatic Screening Protocol (No Symptoms)  Diagnosis: Hypokalemia [[id number]]  Admitting Physician: Rhetta Mura [[id number]]  Attending Physician: Rhetta Mura [[id number]]  Estimated length of stay: past midnight tomorrow  Certification:: I certify this patient will need inpatient services for at least 2 midnights            inpatient     I Expect 2 midnight stay secondary to severity of patient's current illness need for inpatient interventions justified by the following:  Severe lab/radiological/exam abnormalities including:    Hypokalemia and extensive comorbidities including:  substance abuse   Chronic pain  DM2   Morbid Obesity    That are currently affecting medical management.   I expect  patient to be hospitalized for 2 midnights requiring inpatient medical care.  Patient is at high risk for adverse outcome (such as loss of life or disability) if not treated.  Indication for inpatient stay as follows:    severe pain requiring acute inpatient management,  inability to maintain oral hydration     Need for operative/procedural  intervention      Need for IV antibiotics, IV fluids,     Level of care     tele  For 12H     Lab Results  Component Value Date   [place] 10/22/2021     Precautions: admitted as   Covid Negative     Jann Milkovich 10/23/2021, 1:05 AM    [facility]      after 2 AM please page floor coverage PA If 7AM-7PM, please contact the day team taking care of the patient using [email or web address]   Patient was evaluated in the context of the global COVID-19 pandemic,  which necessitated consideration that the patient might be at risk for infection with the SARS-CoV-2 virus that causes COVID-19. Institutional protocols and algorithms that pertain to the evaluation of patients at risk for COVID-19 are in a state of rapid change based on information released by regulatory bodies including the CDC and federal and state organizations. These policies and algorithms were followed during the patient's care.

## 2021-10-22 NOTE — Subjective & Objective (Signed)
Intermittent blood in her stool for past 2 weeks has been recently diagnosed with colitis and treated with antibiotics.  Augmentin in end of November still continue to have ongoing abdominal pain for the past 1 month.  With frequent visits to emergency department and intermittent bloody stools.  Also continues nausea and vomiting. CT abdomen done on 11 December did not show any evidence of colitis she has GI follow-up in January. Today patient was seen at [facility] she states that her stools was jellylike she continued to have significant abdominal pain which is sharp and cramping in her lower abdomen but also nausea vomiting decreased appetite.

## 2021-10-22 NOTE — ED Notes (Signed)
Pt updated to current bed assignment status, provided with 2 hot packs.  Pt asking for more pain meds, and informed that they would be provided to her closer to 3 hour mark from previous administration. Pt agreeable to this plan, will continue to monitor, call bell within reach.

## 2021-10-22 NOTE — ED Notes (Signed)
[facility] at bedside 

## 2021-10-22 NOTE — Assessment & Plan Note (Signed)
Will need to follow-up with pain clinic

## 2021-10-22 NOTE — Progress Notes (Signed)
Patient admitted in to room 1608 with tele box 37, second verified with Joseph Art, RN.

## 2021-10-22 NOTE — ED Notes (Signed)
Pt provided with chicken noodle soup, crackers, ginger ale per her request. Pt provided with hygiene supplies, clean socks, BSC emptied. Pt on phone at this time, no needs expressed. Will continue to monitor.

## 2021-10-22 NOTE — ED Notes (Signed)
Decreased potassium rate per pt comfort

## 2021-10-22 NOTE — ED Provider Notes (Addendum)
[facility] Provider Note: [name], MD, FACEP  CSN: [id number] MRN: [id number] ARRIVAL: 10/22/21 at [place]: [place]  Abdominal Pain and Diarrhea   HISTORY OF PRESENT ILLNESS  10/22/21 2:49 AM Jodi Mcgee is a 35 y.o. female with a history of colitis and chronic abdominal pain.  She is here with 1 month of abdominal pain, back pain and loose "jellylike" stools.  Sometimes loose stools are yellow and sometimes they are darker.  She had green yesterday and her stools currently are darker.  Sometimes they appear to contain blood.  She has been seen in the ED for this 4 times since 09/17/2021.  CT of the abdomen and pelvis on 09/19/2021 showed wall thickening and slight pericolonic inflammatory haziness involving the ascending and transverse colon indicative of an infectious or inflammatory colitis.  CT of the abdomen and pelvis on 09/27/2021 showed similar findings.  She was discharged home on dicyclomine.  CT of the abdomen and pelvis on 10/02/2021 showed no bowel wall thickening.  She describes her abdominal pain as both sharp and cramping.  She rates it as a 10 out of 10.  It is located primarily in her lower abdomen.  She has also had nausea and vomiting and decreased appetite.  She was placed on Augmentin on 09/19/2021 for suspected colitis.   Past Medical History:  Diagnosis Date   Anemia    Anxiety    Blood transfusion without reported diagnosis    both CS   Cocaine abuse ([place])    Diabetes mellitus without complication (HCC)    GERD (gastroesophageal reflux disease)    has resolved   IBS (irritable bowel syndrome)    Ovarian cyst    Peritonitis, acute generalized ([place])    Pulmonary embolism (HCC)    SBO (small bowel obstruction) ([place])    Sepsis ([place])     Past Surgical History:  Procedure Laterality Date   APPENDECTOMY     ruptured    BUNIONECTOMY     CESAREAN SECTION     CESAREAN SECTION Bilateral 11/20/2017   Procedure:  CESAREAN SECTION;  Surgeon: Sloan Leiter, MD;  Location: [facility];  Service: Obstetrics;  Laterality: Bilateral;   OVARIAN CYST DRAINAGE     SMALL BOWEL REPAIR      Family History  Problem Relation Age of Onset   Hypertension Mother    Anemia Mother    Diabetes Father     Social History   Tobacco Use   Smoking status: Every Day    Packs/day: 0.15    Types: Cigarettes   Smokeless tobacco: Never   Tobacco comments:    social use with tobacco  Vaping Use   Vaping Use: Former  Substance Use Topics   Alcohol use: Not Currently    Comment: occ   Drug use: Not Currently    Prior to Admission medications   Medication Sig Start Date End Date Taking? Authorizing Provider  amoxicillin-clavulanate (AUGMENTIN) 875-125 MG tablet Take 1 tablet by mouth every 12 (twelve) hours. 09/19/21   Curatolo, Adam, DO  blood glucose meter kit and supplies KIT Dispense based on patient and insurance preference. Use up to four times daily as directed. (FOR ICD-9 250.00, 250.01). 02/21/19   Oretha Milch D, MD  cephALEXin (KEFLEX) 500 MG capsule Take 1 capsule (500 mg total) by mouth 4 (four) times daily. 09/04/21   Mesner, Corene Cornea, MD  cyclobenzaprine (FLEXERIL) 10 MG tablet Take 1 tablet (10 mg  total) by mouth 3 (three) times daily as needed for muscle spasms. 09/17/21   Veryl Speak, MD  dicyclomine (BENTYL) 20 MG tablet Take 1 tablet (20 mg total) by mouth 2 (two) times daily. 10/02/21   Azucena Cecil, PA-C  HUMULIN R 100 UNIT/ML injection Inject 20 Units into the skin 3 (three) times daily before meals. Per sliding scale 03/24/20   [provider]  hyoscyamine (LEVSIN SL) 0.125 MG SL tablet Place 1 tablet (0.125 mg total) under the tongue every 4 (four) hours as needed for cramping (abdominal pain). 04/15/21   Orpah Greek, MD  insulin detemir (LEVEMIR) 100 unit/ml SOLN Inject 20 Units into the skin 3 (three) times daily. 03/02/21   Matilde Haymaker, MD  Levonorgestrel  (LILETTA) 19.5 MCG/DAY IUD IUD 1 each by Intrauterine route continuous. Placed in 2019    [provider]  loperamide (IMODIUM) 2 MG capsule Take 1 capsule (2 mg total) by mouth 4 (four) times daily as needed for diarrhea or loose stools. 04/08/21   Tedd Sias, PA  ondansetron (ZOFRAN ODT) 4 MG disintegrating tablet 25m ODT q4 hours prn nausea/vomit 09/04/21   Mesner, JCorene Cornea MD  ondansetron (ZOFRAN) 4 MG tablet Take 1 tablet (4 mg total) by mouth every 6 (six) hours. 09/19/21   Curatolo, Adam, DO  oseltamivir (TAMIFLU) 75 MG capsule Take 1 capsule (75 mg total) by mouth every 12 (twelve) hours. 09/05/21   LDoristine Devoid PA-C  oxyCODONE-acetaminophen (PERCOCET) 5-325 MG tablet Take 1 tablet by mouth every 8 (eight) hours as needed for up to 10 doses for severe pain. 09/19/21   Curatolo, Adam, DO  pantoprazole (PROTONIX) 20 MG tablet Take 1 tablet (20 mg total) by mouth daily. 10/02/21   GAzucena Cecil PA-C  polyethylene glycol (MIRALAX / GLYCOLAX) 17 g packet Take 17 g by mouth daily. 03/26/21   Arrien, MJimmy Picket MD  potassium chloride (KLOR-CON) 10 MEQ tablet Take 1 tablet (10 mEq total) by mouth daily. 04/08/21   FTedd Sias PA  potassium chloride (KLOR-CON) 10 MEQ tablet Take 1 tablet (10 mEq total) by mouth 2 (two) times daily. 04/25/21   Palumbo, April, MD  potassium chloride SA (KLOR-CON M) 20 MEQ tablet Take 2 tablets (40 mEq total) by mouth daily. 10/02/21   GAzucena Cecil PA-C  tizanidine (ZANAFLEX) 2 MG capsule Take 1 capsule (2 mg total) by mouth 3 (three) times daily as needed for up to 30 doses for muscle spasms. 08/13/21   Curatolo, Adam, DO    Allergies Hydrocodone, Iodinated contrast media, Cetirizine & related, Toradol [ketorolac tromethamine], Flagyl [metronidazole], Keflex [cephalexin], and Nsaids   REVIEW OF SYSTEMS  Negative except as noted here or in the History of Present Illness.   PHYSICAL EXAMINATION  Initial Vital Signs Blood  pressure (!) 170/111, pulse (!) 111, temperature 97.9 F (36.6 C), temperature source Oral, resp. rate 16, SpO2 100 %.  Examination General: Well-developed, well-nourished female in no acute distress; appearance consistent with age of record HENT: normocephalic; atraumatic Eyes: Normal appearance Neck: supple Heart: regular rate and rhythm; tachycardia Lungs: clear to auscultation bilaterally Abdomen: soft; nondistended; lower abdominal tenderness; bowel sounds present; semiformed dark stool in bedside commode (sent for GI panel and C. difficile testing):    Extremities: No deformity; full range of motion; pulses normal Neurologic: Awake, alert and oriented; motor function intact in all extremities and symmetric; no facial droop Skin: Warm and dry Psychiatric: Normal mood and affect   RESULTS  Summary of this visit's results, reviewed and interpreted by myself:   EKG Interpretation  Date/Time:  Saturday October 22 2021 04:35:04 EST Ventricular Rate:  102 PR Interval:  154 QRS Duration: 92 QT Interval:  352 QTC Calculation: 459 R Axis:   97 Text Interpretation: Sinus tachycardia Borderline right axis deviation QT has shortened Confirmed by Shanon Rosser [phone]) on 10/22/2021 4:39:33 AM       Laboratory Studies: Results for orders placed or performed during the hospital encounter of 10/22/21 (from the past 24 hour(s))  Lipase, blood     Status: None   Collection Time: 10/22/21  3:05 AM  Result Value Ref Range   Lipase 31 11 - 51 U/L  CBC with Differential     Status: Abnormal   Collection Time: 10/22/21  3:05 AM  Result Value Ref Range   WBC 7.2 4.0 - 10.5 K/uL   RBC 4.60 3.87 - 5.11 MIL/uL   Hemoglobin 12.5 12.0 - 15.0 g/dL   HCT 38.3 36.0 - 46.0 %   MCV 83.3 80.0 - 100.0 fL   MCH 27.2 26.0 - 34.0 pg   MCHC 32.6 30.0 - 36.0 g/dL   RDW 14.1 11.5 - 15.5 %   Platelets 426 (H) 150 - 400 K/uL   nRBC 0.0 0.0 - 0.2 %   Neutrophils Relative % 75 %   Neutro Abs 5.4 1.7  - 7.7 K/uL   Lymphocytes Relative 18 %   Lymphs Abs 1.3 0.7 - 4.0 K/uL   Monocytes Relative 5 %   Monocytes Absolute 0.4 0.1 - 1.0 K/uL   Eosinophils Relative 1 %   Eosinophils Absolute 0.1 0.0 - 0.5 K/uL   Basophils Relative 1 %   Basophils Absolute 0.0 0.0 - 0.1 K/uL   Immature Granulocytes 0 %   Abs Immature Granulocytes 0.02 0.00 - 0.07 K/uL  Basic metabolic panel     Status: Abnormal   Collection Time: 10/22/21  3:16 AM  Result Value Ref Range   Sodium 136 135 - 145 mmol/L   Potassium 2.5 (LL) 3.5 - 5.1 mmol/L   Chloride 98 98 - 111 mmol/L   CO2 30 22 - 32 mmol/L   Glucose, Bld 166 (H) 70 - 99 mg/dL   BUN 10 6 - 20 mg/dL   Creatinine, Ser 0.62 0.44 - 1.00 mg/dL   Calcium 8.7 (L) 8.9 - 10.3 mg/dL   GFR, Estimated >60 >60 mL/min   Anion gap 8 5 - 15  Occult blood card to lab, stool RN will collect     Status: Abnormal   Collection Time: 10/22/21  3:18 AM  Result Value Ref Range   Fecal Occult Bld POSITIVE (A) NEGATIVE   Imaging Studies: No results found.  ED COURSE and MDM  Nursing notes, initial and subsequent vitals signs, including pulse oximetry, reviewed and interpreted by myself.  Vitals:   10/22/21 0230 10/22/21 0327  BP: (!) 170/111   Pulse: (!) 111   Resp: 16   Temp: 97.9 F (36.6 C)   TempSrc: Oral   SpO2: 100%   Weight:  59 kg  Height:  _0  (1.702 m)   Medications  potassium chloride 10 mEq in 100 mL IVPB (10 mEq Intravenous New Bag/Given 10/22/21 0501)  diphenhydrAMINE (BENADRYL) injection 25 mg (has no administration in time range)  lactated ringers bolus 1,000 mL (1,000 mLs Intravenous New Bag/Given 10/22/21 0444)  ondansetron (ZOFRAN) injection 4 mg (4 mg Intravenous Given 10/22/21 0439)  dicyclomine (BENTYL) injection 20  mg (20 mg Intramuscular Given 10/22/21 0440)  fentaNYL (SUBLIMAZE) injection 50 mcg (50 mcg Intravenous Given 10/22/21 0440)  potassium chloride SA (KLOR-CON M) CR tablet 40 mEq (40 mEq Oral Given 10/22/21 0457)   3:45  AM Potassium repletion initiated.  Stool sent for GI panel and C. difficile testing.  4:05 AM Dr. Velia Meyer to admit to hospitalist service.   PROCEDURES  Procedures   ED DIAGNOSES     ICD-10-CM   1. Hypokalemia due to excessive gastrointestinal loss of potassium  E87.6     2. Diarrhea of presumed infectious origin  R19.7     3. Chronic abdominal pain  R10.9    G89.29     4. Nausea and vomiting in adult  R11.2          Shanon Rosser, MD 10/22/21 0405    Shanon Rosser, MD 10/22/21 4327    Shanon Rosser, MD 10/22/21 [phone]

## 2021-10-22 NOTE — Progress Notes (Signed)
Patient: Jodi Mcgee, Jodi Mcgee  DOB: May 18, 1986 IWO:[id number] Transferring facility: [facility] Requesting provider: Dr Florina Ou (EDP at [facility]) Reason for transfer: admission for further evaluation and management of hypokalemia.  35 year old female who presented to [facility] ED complaining of several days of worsening diarrhea, with ensuing labs notable for potassium 2.5, which is new for her.  This is in the context of a diagnosis of colitis approximately 1 month ago, prompting  course of Augmentin.  C. difficile/GI panel by PCR have been ordered, with results currently pending.  She reportedly has history of chronic abdominal pain, and has undergone 3 CT abdomen/pelvis studies in the last month.  Consequently, she did not undergo CT abdomen/pelvis this evening.  Started on oral and IV potassium supplementation at [facility].  COVID-19/influenza PCR results currently pending.  Subsequently, I accepted this patient for transfer for inpatient admission to a med telemetry bed at [facility] for further work-up and management of presenting hypokalemia in setting of diarrhea.     Babs Bertin, DO Hospitalist

## 2021-10-22 NOTE — ED Notes (Signed)
Responded to room for call bell out of wall.  Found patient sitting on side of bed and moaning stating that her arm was hurting.  R arm IV found to be infiltrated.  IV catheter removed with catheter tip intact.  Attempted peripheral IV again; however, was unsuccessful.  Ultrasound IV requested.

## 2021-10-22 NOTE — Assessment & Plan Note (Signed)
-   will replace and repeat in AM,  check magnesium level and replace as needed ° °

## 2021-10-22 NOTE — ED Notes (Signed)
Pt provided with peanut butter crackers and diet ginger ale per her request. No further needs expressed, will continue to monitor.

## 2021-10-23 ENCOUNTER — Inpatient Hospital Stay (HOSPITAL_COMMUNITY): Payer: Medicaid Other

## 2021-10-23 DIAGNOSIS — K529 Noninfective gastroenteritis and colitis, unspecified: Secondary | ICD-10-CM

## 2021-10-23 DIAGNOSIS — K921 Melena: Secondary | ICD-10-CM | POA: Diagnosis present

## 2021-10-23 DIAGNOSIS — N390 Urinary tract infection, site not specified: Secondary | ICD-10-CM | POA: Diagnosis present

## 2021-10-23 DIAGNOSIS — R1032 Left lower quadrant pain: Secondary | ICD-10-CM | POA: Diagnosis present

## 2021-10-23 LAB — GASTROINTESTINAL PANEL BY PCR, STOOL (REPLACES STOOL CULTURE)

## 2021-10-23 LAB — GLUCOSE, CAPILLARY
Glucose-Capillary: 108 mg/dL — ABNORMAL HIGH (ref 70–99)
Glucose-Capillary: 113 mg/dL — ABNORMAL HIGH (ref 70–99)
Glucose-Capillary: 206 mg/dL — ABNORMAL HIGH (ref 70–99)
Glucose-Capillary: 62 mg/dL — ABNORMAL LOW (ref 70–99)
Glucose-Capillary: 71 mg/dL (ref 70–99)
Glucose-Capillary: 72 mg/dL (ref 70–99)
Glucose-Capillary: 84 mg/dL (ref 70–99)

## 2021-10-23 LAB — SEDIMENTATION RATE: Sed Rate: 26 mm/hr — ABNORMAL HIGH (ref 0–22)

## 2021-10-23 LAB — CBC WITH DIFFERENTIAL/PLATELET
Abs Immature Granulocytes: 0.01 10*3/uL (ref 0.00–0.07)
Basophils Absolute: 0 10*3/uL (ref 0.0–0.1)
Basophils Relative: 1 %
Eosinophils Absolute: 0.1 10*3/uL (ref 0.0–0.5)
Eosinophils Relative: 2 %
HCT: 36 % (ref 36.0–46.0)
Hemoglobin: 11.5 g/dL — ABNORMAL LOW (ref 12.0–15.0)
Immature Granulocytes: 0 %
Lymphocytes Relative: 33 %
Lymphs Abs: 1.6 10*3/uL (ref 0.7–4.0)
MCH: 27.6 pg (ref 26.0–34.0)
MCHC: 31.9 g/dL (ref 30.0–36.0)
MCV: 86.3 fL (ref 80.0–100.0)
Monocytes Absolute: 0.3 10*3/uL (ref 0.1–1.0)
Monocytes Relative: 6 %
Neutro Abs: 3 10*3/uL (ref 1.7–7.7)
Neutrophils Relative %: 58 %
Platelets: 338 10*3/uL (ref 150–400)
RBC: 4.17 MIL/uL (ref 3.87–5.11)
RDW: 13.9 % (ref 11.5–15.5)
WBC: 5 10*3/uL (ref 4.0–10.5)
nRBC: 0 % (ref 0.0–0.2)

## 2021-10-23 LAB — RETICULOCYTES
Immature Retic Fract: 20.4 % — ABNORMAL HIGH (ref 2.3–15.9)
RBC.: 4.08 MIL/uL (ref 3.87–5.11)
Retic Count, Absolute: 95.9 10*3/uL (ref 19.0–186.0)
Retic Ct Pct: 2.4 % (ref 0.4–3.1)

## 2021-10-23 LAB — COMPREHENSIVE METABOLIC PANEL
ALT: 10 U/L (ref 0–44)
AST: 10 U/L — ABNORMAL LOW (ref 15–41)
Albumin: 3 g/dL — ABNORMAL LOW (ref 3.5–5.0)
Alkaline Phosphatase: 57 U/L (ref 38–126)
Anion gap: 5 (ref 5–15)
BUN: <5 mg/dL — ABNORMAL LOW (ref 6–20)
CO2: 26 mmol/L (ref 22–32)
Calcium: 8.4 mg/dL — ABNORMAL LOW (ref 8.9–10.3)
Chloride: 108 mmol/L (ref 98–111)
Creatinine, Ser: 0.49 mg/dL (ref 0.44–1.00)
GFR, Estimated: >60 mL/min (ref >60)
Glucose, Bld: 73 mg/dL (ref 70–99)
Potassium: 3.2 mmol/L — ABNORMAL LOW (ref 3.5–5.1)
Sodium: 139 mmol/L (ref 135–145)
Total Bilirubin: 0.4 mg/dL (ref 0.3–1.2)
Total Protein: 6.7 g/dL (ref 6.5–8.1)

## 2021-10-23 LAB — PHOSPHORUS: Phosphorus: 3.4 mg/dL (ref 2.5–4.6)

## 2021-10-23 LAB — MAGNESIUM: Magnesium: 2 mg/dL (ref 1.7–2.4)

## 2021-10-23 LAB — IRON AND TIBC
Iron: 49 ug/dL (ref 28–170)
Saturation Ratios: 15 % (ref 10.4–31.8)
TIBC: 327 ug/dL (ref 250–450)
UIBC: 278 ug/dL

## 2021-10-23 LAB — TSH: TSH: 3.314 u[IU]/mL (ref 0.350–4.500)

## 2021-10-23 LAB — VITAMIN B12: Vitamin B-12: 430 pg/mL (ref 180–914)

## 2021-10-23 LAB — FOLATE: Folate: 4.6 ng/mL — ABNORMAL LOW (ref >5.9)

## 2021-10-23 LAB — HEMOGLOBIN A1C
Hgb A1c MFr Bld: 6.9 % — ABNORMAL HIGH (ref 4.8–5.6)
Mean Plasma Glucose: 151.33 mg/dL

## 2021-10-23 LAB — C-REACTIVE PROTEIN: CRP: 0.9 mg/dL (ref <1.0)

## 2021-10-23 LAB — HIV ANTIBODY (ROUTINE TESTING W REFLEX): HIV Screen 4th Generation wRfx: NONREACTIVE

## 2021-10-23 LAB — FERRITIN: Ferritin: 24 ng/mL (ref 11–307)

## 2021-10-23 MED ORDER — POTASSIUM CHLORIDE 2 MEQ/ML IV SOLN: INTRAVENOUS | Status: DC

## 2021-10-23 MED ORDER — PANTOPRAZOLE SODIUM 40 MG IV SOLR
40.0000 mg | INTRAVENOUS | Status: DC
  Administered 2021-10-23 – 2021-10-27 (×5): 40 mg via INTRAVENOUS
  Filled 2021-10-23 (×4): qty 40

## 2021-10-23 MED ORDER — DICYCLOMINE HCL 20 MG PO TABS
20.0000 mg | ORAL_TABLET | Freq: Two times a day (BID) | ORAL | Status: DC
  Administered 2021-10-23 – 2021-10-28 (×11): 20 mg via ORAL
  Filled 2021-10-23 (×12): qty 1

## 2021-10-23 MED ORDER — DIPHENHYDRAMINE HCL 25 MG PO CAPS
25.0000 mg | ORAL_CAPSULE | Freq: Four times a day (QID) | ORAL | Status: DC | PRN
  Administered 2021-10-23 (×2): 25 mg via ORAL
  Filled 2021-10-23 (×2): qty 1

## 2021-10-23 MED ORDER — INSULIN DETEMIR 100 UNIT/ML ~~LOC~~ SOLN
20.0000 [IU] | Freq: Every day | SUBCUTANEOUS | Status: DC
  Administered 2021-10-23: 20 [IU] via SUBCUTANEOUS
  Filled 2021-10-23: qty 0.2

## 2021-10-23 MED ORDER — CIPROFLOXACIN IN D5W 400 MG/200ML IV SOLN
400.0000 mg | Freq: Two times a day (BID) | INTRAVENOUS | Status: AC
  Administered 2021-10-23 – 2021-10-28 (×10): 400 mg via INTRAVENOUS
  Filled 2021-10-23 (×10): qty 200

## 2021-10-23 MED ORDER — NICOTINE 7 MG/24HR TD PT24
7.0000 mg | MEDICATED_PATCH | Freq: Every day | TRANSDERMAL | Status: DC
  Administered 2021-10-23 – 2021-10-28 (×6): 7 mg via TRANSDERMAL
  Filled 2021-10-23 (×6): qty 1

## 2021-10-23 MED ORDER — TIZANIDINE HCL 2 MG PO TABS
2.0000 mg | ORAL_TABLET | Freq: Three times a day (TID) | ORAL | Status: DC | PRN
  Administered 2021-10-27: 2 mg via ORAL
  Filled 2021-10-23 (×3): qty 1

## 2021-10-23 MED ORDER — SODIUM CHLORIDE 0.9 % IV SOLN: Freq: Once | INTRAVENOUS | Status: AC

## 2021-10-23 MED ORDER — ACETAMINOPHEN 650 MG RE SUPP: 650.0000 mg | Freq: Four times a day (QID) | RECTAL | Status: DC | PRN

## 2021-10-23 MED ORDER — OXYCODONE HCL 5 MG PO TABS
5.0000 mg | ORAL_TABLET | ORAL | Status: DC | PRN
  Administered 2021-10-23 – 2021-10-26 (×9): 5 mg via ORAL
  Filled 2021-10-23 (×14): qty 1

## 2021-10-23 MED ORDER — POTASSIUM CHLORIDE ER 10 MEQ PO TBCR
10.0000 meq | EXTENDED_RELEASE_TABLET | Freq: Every day | ORAL | Status: DC
  Administered 2021-10-23 – 2021-10-28 (×6): 10 meq via ORAL
  Filled 2021-10-23 (×12): qty 1

## 2021-10-23 MED ORDER — PANTOPRAZOLE SODIUM 20 MG PO TBEC
20.0000 mg | DELAYED_RELEASE_TABLET | Freq: Every day | ORAL | Status: DC
  Filled 2021-10-23: qty 1

## 2021-10-23 MED ORDER — KCL IN DEXTROSE-NACL 20-5-0.45 MEQ/L-%-% IV SOLN
INTRAVENOUS | Status: DC
  Administered 2021-10-23: 1000 mL via INTRAVENOUS
  Filled 2021-10-23 (×6): qty 1000

## 2021-10-23 MED ORDER — ACETAMINOPHEN 325 MG PO TABS
650.0000 mg | ORAL_TABLET | Freq: Four times a day (QID) | ORAL | Status: DC | PRN
  Filled 2021-10-23: qty 2

## 2021-10-23 NOTE — Assessment & Plan Note (Signed)
Seems to be ongoing but now also radiating to the back given possibility of UTI will obtain CT abdomen to rule out any perinephric abscess as well as to evaluate further for any persistent colitis

## 2021-10-23 NOTE — Progress Notes (Signed)
Hypoglycemic Event  CBG: 62  Treatment: 4 oz juice/soda  Symptoms: None  Follow-up CBG: Time:0410 CBG Result:72  Possible Reasons for Event: Medication regimen:    Comments/MD notified:    Jason Coop

## 2021-10-23 NOTE — Assessment & Plan Note (Addendum)
Check urine tox screen Discussed importance of obstaining

## 2021-10-23 NOTE — Assessment & Plan Note (Signed)
Moderate persistent Hemoccult positive CBC stable.  Given ongoing diarrhea ongoing abdominal pain and bloody stools would benefit from GI consult

## 2021-10-23 NOTE — Plan of Care (Signed)
  Problem: Education: Goal: Knowledge of General Education information will improve Description Including pain rating scale, medication(s)/side effects and non-pharmacologic comfort measures Outcome: Progressing   

## 2021-10-23 NOTE — Assessment & Plan Note (Signed)
Not on home meds

## 2021-10-23 NOTE — Progress Notes (Addendum)
PROGRESS NOTE    Jodi Mcgee  YDX:[id number] DOB: 1986/02/04 DOA: 10/22/2021 PCP: [facility]   Chief Complaint  Patient presents with   Abdominal Pain   Diarrhea   Brief Narrative/Hospital Course: Jodi Mcgee, 36 y.o. female with PMH of colitis, IBS with chronic abdominal pain, anxiety disorder, anemia remote history of cocaine abuse diabetes mellitus on insulin, history of SBO, history of peritonitis, history of PE who has been having abdominal pain and back pain and loose "jellylike" stool for a month, at times containing blood, has had multiple ED visits 4 times since 09/17/2021 and CT of the abdomen pelvis on 11/28 showed bowel wall thickening and slight pericolonic inflammatory haziness involving the ascending and transverse colon-infectious or inflammatory colitis, repeat CT on 12/6 similar finding, discharged on dicyclomine, Imodium.  CT on 12/11 showed no bowel wall thickening In the ED vitals stable lab with hypokalemia stool test negative for C. difficile, Hemoccult test positive GI panel sent out, CT abdomen pelvis with recurrent inflammatory changes with extensive proctocolitis progressive since 11/28, given recent antibiotic therapy pseudomembranous colitis should be considered, alternatively inflammatory condition like ulcerative colitis could appear in this fashion, minimal nodular infiltrate in the lingula. She was placed on IV fluids ceftriaxone and admitted for further management  Subjective: Seen and examined this morning. Overnight hypoglycemia 62. Received Lantus 20 units early morning Complains of abdominal pain  Assessment & Plan:  Proctocolitis Abdominal pain Hemoccult positive Blood in the stool: Recurrent ED visits, imaging with proctocolitis progressive since previous imaging. "Jellylike" stool, heme positive.  Differential includes inflammatory/infectious etiology, C. difficile is negative, GI panel pending,?  IBD.   Check ESR CRP.  GI has been consulted once GI panel lab is back GI- Dr Watt Climes will see the patient for possible sigmoidoscopy/colonoscopy we will keep on liquid diet.  Continue  pain control-p.o. oxy and IV Dilaudid PRN.  Hypokalemia in the setting of above replete with p.o. and IV fluids  Type 2 diabetes mellitus on insulin with hypoglycemia: A1c 6.9.  We will hold insulin, keep on IV fluids continue SSI every 4 hours Recent Labs  Lab 10/23/21 0034 10/23/21 0348 10/23/21 0410 10/23/21 0745  GLUCAP 206* 62* 72 71     Chronic pain syndrome-follow-up with pain clinic  History of cocaine abuse: She was counseled on admission.  UDS is pending.  She last used a week ago  Acute lower UTI continue ceftriaxone follow-up urine culture  DVT prophylaxis: SCDs Start: 10/23/21 0027 no chemical prophylaxis due to blood-tinged stool Code Status:   Code Status: Full Code Family Communication: plan of care discussed with patient at bedside. Status is: Inpatient Remains inpatient appropriate because: Ongoing abdominal pain and work-up of colitis Disposition: Currently not medically stable for discharge. Anticipated Disposition: Home  Objective: Vitals last 24 hrs: Vitals:   10/22/21 1910 10/22/21 2100 10/23/21 0144 10/23/21 0530  BP: (!) 155/123 (!) 154/99 (!) 143/95 (!) 139/107  Pulse:  94 95 94  Resp: _0 Temp:   98.1 F (36.7 C) 98.1 F (36.7 C)  TempSrc:   Oral Oral  SpO2:  97% 100% 100%  Weight:      Height:       Weight change:   Intake/Output Summary (Last 24 hours) at 10/23/2021 1022 Last data filed at 10/23/2021 0318 Gross per 24 hour  Intake 770.77 ml  Output --  Net 770.77 ml   Net IO Since Admission: 1,971.36 mL [10/23/21 1022]  Physical Examination: General exam: AA0x3, weak,older than stated age. HEENT:Oral mucosa moist, Ear/Nose WNL grossly,dentition normal. Respiratory system: B/l clear BS, no use of accessory muscle, non tender. Cardiovascular system: S1  & S2 +,No JVD. Gastrointestinal system: Abdomen soft, tender in lower abdomen,ND, BS+. Nervous System:Alert, awake, moving extremities. Extremities: edema none, distal peripheral pulses palpable.  Skin: No rashes, no icterus. MSK: Normal muscle bulk, tone, power.  Medications reviewed:  Scheduled Meds:  dicyclomine  20 mg Oral BID   insulin aspart  0-9 Units Subcutaneous Q4H   nicotine  7 mg Transdermal Daily   pantoprazole (PROTONIX) IV  40 mg Intravenous Q24H   potassium chloride  10 mEq Oral Daily   Continuous Infusions:  cefTRIAXone (ROCEPHIN)  IV Stopped (10/23/21 0002)   dextrose 5 % and 0.45 % NaCl with KCl 20 mEq/L 1,000 mL (10/23/21 0858)   Diet Order             Diet clear liquid Room service appropriate? Yes; Fluid consistency: Thin  Diet effective now                  Weight change:   Wt Readings from Last 3 Encounters:  10/22/21 59 kg  10/02/21 59 kg  09/27/21 59 kg  Consultants:see note  Procedures:see note Antimicrobials: Anti-infectives (From admission, onward)    Start     Dose/Rate Route Frequency Ordered Stop   10/22/21 2300  cefTRIAXone (ROCEPHIN) 1 g in sodium chloride 0.9 % 100 mL IVPB        1 g 200 mL/hr over 30 Minutes Intravenous Every 24 hours 10/22/21 2208        Culture/Microbiology Other culture-see note  Unresulted Labs (From admission, onward)     Start     Ordered   10/24/21 0500  CBC  Daily,   R     Question:  Specimen collection method  Answer:  Lab=Lab collect   10/23/21 0758   10/24/21 1245  Basic metabolic panel  Daily,   R     Question:  Specimen collection method  Answer:  Lab=Lab collect   10/23/21 0758   10/23/21 0759  C-reactive protein  Add-on,   AD        10/23/21 0758   10/23/21 0500  HIV Antibody (routine testing w rflx)  (HIV Antibody (Routine testing w reflex) panel)  Tomorrow morning,   R        10/23/21 0028   10/22/21 2206  Urine Culture  Once,   R       Question:  Indication  Answer:  Dysuria    10/22/21 2205   10/22/21 2204  Urine rapid drug screen (hosp performed)  ONCE - STAT,   STAT        10/22/21 2204   10/22/21 0302  Gastrointestinal Panel by PCR , Stool  (Gastrointestinal Panel by PCR, Stool                                                                                                                                                     **  Does Not include CLOSTRIDIUM DIFFICILE testing. **If CDIFF testing is needed, place order from the "C Difficile Testing" order set.**)  Once,   R        10/22/21 0302           Data Reviewed: I have personally reviewed following labs and imaging studies CBC: Recent Labs  Lab 10/22/21 0305 10/22/21 2303 10/23/21 0552  WBC 7.2 4.6 5.0  NEUTROABS 5.4  --  3.0  HGB 12.5 11.9* 11.5*  HCT 38.3 37.0 36.0  MCV 83.3 85.6 86.3  PLT 426* 301 062   Basic Metabolic Panel: Recent Labs  Lab 10/22/21 0316 10/22/21 2303 10/23/21 0552  NA 136 138 139  K 2.5* 3.2* 3.2*  CL 98 106 108  CO2 _0 GLUCOSE 166* 165* 73  BUN 10 6 <5*  CREATININE 0.62 0.57 0.49  CALCIUM 8.7* 8.3* 8.4*  MG  --  1.9 2.0  PHOS  --  3.3 3.4   GFR: Estimated Creatinine Clearance: 90.5 mL/min (by C-G formula based on SCr of 0.49 mg/dL). Liver Function Tests: Recent Labs  Lab 10/22/21 2303 10/23/21 0552  AST 10* 10*  ALT 10 10  ALKPHOS 64 57  BILITOT 0.5 0.4  PROT 7.0 6.7  ALBUMIN 3.1* 3.0*   Recent Labs  Lab 10/22/21 0305  LIPASE 31   No results for input(s): AMMONIA in the last 168 hours. Coagulation Profile: No results for input(s): INR, PROTIME in the last 168 hours. Cardiac Enzymes: Recent Labs  Lab 10/22/21 2303  CKTOTAL 39   BNP (last 3 results) No results for input(s): PROBNP in the last 8760 hours. HbA1C: Recent Labs    10/22/21 2348  HGBA1C 6.9*   CBG: Recent Labs  Lab 10/23/21 0034 10/23/21 0348 10/23/21 0410 10/23/21 0745  GLUCAP 206* 62* 72 71   Lipid Profile: No results for input(s): CHOL, HDL, LDLCALC, TRIG,  CHOLHDL, LDLDIRECT in the last 72 hours. Thyroid Function Tests: Recent Labs    10/23/21 0552  TSH 3.314   Anemia Panel: Recent Labs    10/23/21 0552  VITAMINB12 430  FOLATE 4.6*  FERRITIN 24  TIBC 327  IRON 49  RETICCTPCT 2.4   Sepsis Labs: No results for input(s): PROCALCITON, LATICACIDVEN in the last 168 hours.  Recent Results (from the past 240 hour(s))  C Difficile Quick Screen w PCR reflex     Status: None   Collection Time: 10/22/21  3:18 AM   Specimen: STOOL  Result Value Ref Range Status   C Diff antigen NEGATIVE NEGATIVE Final   C Diff toxin NEGATIVE NEGATIVE Final   C Diff interpretation No C. difficile detected.  Final    Comment: Performed at [facility], [place] [address], [place], [place] [address]  Resp Panel by RT-PCR (Flu A&B, Covid) Nasopharyngeal Swab     Status: None   Collection Time: 10/22/21  4:51 AM   Specimen: Nasopharyngeal Swab; Nasopharyngeal(NP) swabs in vial transport medium  Result Value Ref Range Status   SARS Coronavirus 2 by RT PCR NEGATIVE NEGATIVE Final    Comment: (NOTE) SARS-CoV-2 target nucleic acids are NOT DETECTED.  The SARS-CoV-2 RNA is generally detectable in upper respiratory specimens during the acute phase of infection. The lowest concentration of SARS-CoV-2 viral copies this assay can detect is 138 copies/mL. A negative result does not preclude SARS-Cov-2 infection and should not be used as the sole basis for treatment or other patient management decisions. A negative result may occur with  improper specimen collection/handling, submission of specimen other than nasopharyngeal swab, presence of viral mutation(s) within the areas targeted by this assay, and inadequate number of viral copies(<138 copies/mL). A negative result must be combined with clinical observations, patient history, and epidemiological information. The expected result is Negative.  Fact Sheet for Patients:   EntrepreneurPulse.com.au  Fact Sheet for Healthcare Providers:  IncredibleEmployment.be  This test is no t yet approved or cleared by the [place] FDA and  has been authorized for detection and/or diagnosis of SARS-CoV-2 by FDA under an Emergency Use Authorization (EUA). This EUA will remain  in effect (meaning this test can be used) for the duration of the COVID-19 declaration under Section 564(b)(1) of the Act, 21 U.S.C.section 360bbb-3(b)(1), unless the authorization is terminated  or revoked sooner.       Influenza A by PCR NEGATIVE NEGATIVE Final   Influenza B by PCR NEGATIVE NEGATIVE Final    Comment: (NOTE) The Xpert Xpress SARS-CoV-2/FLU/RSV plus assay is intended as an aid in the diagnosis of influenza from Nasopharyngeal swab specimens and should not be used as a sole basis for treatment. Nasal washings and aspirates are unacceptable for Xpert Xpress SARS-CoV-2/FLU/RSV testing.  Fact Sheet for Patients: EntrepreneurPulse.com.au  Fact Sheet for Healthcare Providers: IncredibleEmployment.be  This test is not yet approved or cleared by the [place] FDA and has been authorized for detection and/or diagnosis of SARS-CoV-2 by FDA under an Emergency Use Authorization (EUA). This EUA will remain in effect (meaning this test can be used) for the duration of the COVID-19 declaration under Section 564(b)(1) of the Act, 21 U.S.C. section 360bbb-3(b)(1), unless the authorization is terminated or revoked.  Performed at [facility], [address] [place], [place] [address]      Radiology Studies: CT ABDOMEN PELVIS WO CONTRAST  Result Date: 10/23/2021 CLINICAL DATA:  Left lower quadrant abdominal pain EXAM: CT ABDOMEN AND PELVIS WITHOUT CONTRAST TECHNIQUE: Multidetector CT imaging of the abdomen and pelvis was performed following the standard protocol without IV contrast. COMPARISON:   10/02/2021, 09/27/2021 FINDINGS: Lower chest: Nodular infiltrate has developed within the a lingula, best seen on image # 5/4, likely infectious or inflammatory. The visualized lung bases are otherwise clear. Visualized heart and pericardium are unremarkable. Hepatobiliary: No focal liver abnormality is seen. No gallstones, gallbladder wall thickening, or biliary dilatation. Pancreas: Unremarkable Spleen: Unremarkable Adrenals/Urinary Tract: Adrenal glands are unremarkable. Kidneys are normal, without renal calculi, focal lesion, or hydronephrosis. Bladder is unremarkable. Stomach/Bowel: Since the prior examination, there has developed extensive pericolonic inflammatory stranding involving the transverse, descending, and rectosigmoid colon in keeping with an infectious or inflammatory proctocolitis. These changes appear progressive since immediate prior examination and appear more severe than on remote prior examination of 09/19/2021 where this was first identified. No evidence of obstruction. No free intraperitoneal gas or fluid. Stomach and small bowel are unremarkable. The appendix is not clearly identified and may be absent. Vascular/Lymphatic: No significant vascular findings are present. No enlarged abdominal or pelvic lymph nodes. Reproductive: Intrauterine device in expected position within the endometrial cavity. The pelvic organs are otherwise unremarkable. Other: No abdominal wall hernia. Musculoskeletal: No acute bone abnormality. No lytic or blastic bone lesion. IMPRESSION: Recurrent inflammatory changes in keeping with extensive proctocolitis. This appears progressive since immediate prior examination and more severe than remote prior examination of 09/19/2021. Given the history of recent antibiotic therapy, pseudomembranous colitis should be considered. Alternatively, inflammatory conditions such as ulcerative colitis could appear in this fashion. No evidence  of obstruction or perforation. Minimal  nodular infiltrate within the lingula, nonspecific, possibly infectious or inflammatory. Electronically Signed   By: Fidela Salisbury M.D.   On: 10/23/2021 04:04     LOS: 1 day   Antonieta Pert, MD [facility]  10/23/2021, 10:22 AM

## 2021-10-23 NOTE — Assessment & Plan Note (Signed)
Order rocephin has tolerated it in the past we will continue.  Obtain urine culture

## 2021-10-24 DIAGNOSIS — K529 Noninfective gastroenteritis and colitis, unspecified: Secondary | ICD-10-CM | POA: Diagnosis not present

## 2021-10-24 LAB — CBC
HCT: 34.1 % — ABNORMAL LOW (ref 36.0–46.0)
Hemoglobin: 11 g/dL — ABNORMAL LOW (ref 12.0–15.0)
MCH: 27.5 pg (ref 26.0–34.0)
MCHC: 32.3 g/dL (ref 30.0–36.0)
MCV: 85.3 fL (ref 80.0–100.0)
Platelets: 275 10*3/uL (ref 150–400)
RBC: 4 MIL/uL (ref 3.87–5.11)
RDW: 13.8 % (ref 11.5–15.5)
WBC: 4.1 10*3/uL (ref 4.0–10.5)
nRBC: 0 % (ref 0.0–0.2)

## 2021-10-24 LAB — GLUCOSE, CAPILLARY
Glucose-Capillary: 104 mg/dL — ABNORMAL HIGH (ref 70–99)
Glucose-Capillary: 111 mg/dL — ABNORMAL HIGH (ref 70–99)
Glucose-Capillary: 115 mg/dL — ABNORMAL HIGH (ref 70–99)
Glucose-Capillary: 136 mg/dL — ABNORMAL HIGH (ref 70–99)
Glucose-Capillary: 138 mg/dL — ABNORMAL HIGH (ref 70–99)
Glucose-Capillary: 98 mg/dL (ref 70–99)

## 2021-10-24 LAB — BASIC METABOLIC PANEL
Anion gap: 7 (ref 5–15)
BUN: <5 mg/dL — ABNORMAL LOW (ref 6–20)
CO2: 24 mmol/L (ref 22–32)
Calcium: 8.5 mg/dL — ABNORMAL LOW (ref 8.9–10.3)
Chloride: 105 mmol/L (ref 98–111)
Creatinine, Ser: 0.6 mg/dL (ref 0.44–1.00)
GFR, Estimated: >60 mL/min (ref >60)
Glucose, Bld: 177 mg/dL — ABNORMAL HIGH (ref 70–99)
Potassium: 3.5 mmol/L (ref 3.5–5.1)
Sodium: 136 mmol/L (ref 135–145)

## 2021-10-24 MED ORDER — GLUCERNA SHAKE PO LIQD
237.0000 mL | Freq: Three times a day (TID) | ORAL | Status: DC
  Administered 2021-10-24 – 2021-10-28 (×4): 237 mL via ORAL
  Filled 2021-10-24 (×13): qty 237

## 2021-10-24 MED ORDER — DIPHENHYDRAMINE HCL 50 MG/ML IJ SOLN
12.5000 mg | Freq: Once | INTRAMUSCULAR | Status: AC
  Administered 2021-10-24: 12.5 mg via INTRAVENOUS
  Filled 2021-10-24: qty 1

## 2021-10-24 MED ORDER — PROSOURCE PLUS PO LIQD: 30.0000 mL | Freq: Three times a day (TID) | ORAL | Status: DC

## 2021-10-24 MED ORDER — HYDRALAZINE HCL 25 MG PO TABS
25.0000 mg | ORAL_TABLET | Freq: Four times a day (QID) | ORAL | Status: DC | PRN
  Administered 2021-10-24 – 2021-10-25 (×3): 25 mg via ORAL
  Filled 2021-10-24 (×3): qty 1

## 2021-10-24 NOTE — Progress Notes (Signed)
°  Transition of Care [facility]) Screening Note   Patient Details  Name: Jodi Mcgee Date of Birth: [birth year]   Transition of Care [facility] CM/SW Contact:    Lennart Pall, LCSW Phone Number: 10/24/2021, 4:06 PM    Transition of Care Department [facility]) has reviewed patient and no TOC needs have been identified at this time. We will continue to monitor patient advancement through interdisciplinary progression rounds. If new patient transition needs arise, please place a TOC consult.

## 2021-10-24 NOTE — Progress Notes (Signed)
Dr. Maren Beach notified of Jodi Mcgee's Bp- 172/110 in Lt arm and 170/120 in Rt. Hydralazine ordered, waiting for pharmacy to approve. Night nurse informed in report.

## 2021-10-24 NOTE — Progress Notes (Signed)
PROGRESS NOTE    Jodi Mcgee  ZOX:[id number] DOB: [birth year] DOA: 10/22/2021 PCP: [facility]   Chief Complaint  Patient presents with   Abdominal Pain   Diarrhea   Brief Narrative/Hospital Course: Jodi Mcgee, 36 y.o. female with PMH of colitis, IBS with chronic abdominal pain, anxiety disorder, anemia remote history of cocaine abuse diabetes mellitus on insulin, history of SBO, history of peritonitis, history of PE who has been having abdominal pain and back pain and loose "jellylike" stool for a month, at times containing blood, has had multiple ED visits 4 times since 09/17/2021 and CT of the abdomen pelvis on 11/28 showed bowel wall thickening and slight pericolonic inflammatory haziness involving the ascending and transverse colon-infectious or inflammatory colitis, repeat CT on 12/6 similar finding, discharged on dicyclomine, Imodium.  CT on 12/11 showed no bowel wall thickening In the ED vitals stable lab with hypokalemia stool test negative for C. difficile, Hemoccult test positive GI panel sent out, CT abdomen pelvis with recurrent inflammatory changes with extensive proctocolitis progressive since 11/28, given recent antibiotic therapy pseudomembranous colitis should be considered, alternatively inflammatory condition like ulcerative colitis could appear in this fashion, minimal nodular infiltrate in the lingula. She was placed on IV fluids ceftriaxone and admitted for further management  Subjective: Seen and examined this morning. Afebrile overnight, blood pressure stable  BM x6 yesterday-attempted some water and jellylike She has been hungry and requesting to increase the diet Complains of ongoing abdominal pain  Assessment & Plan:  Proctocolitis Abdominal pain Hemoccult positive Blood in the stool EAEC gastroenteritis: Imaging with proctocolitis could be infectious, inflammatory, IBD .recurrent ED visits, imaging with  proctocolitis progressive since previous imaging. "Jellylike" stool, heme positive.  C. difficile negative exam findings included negative E. coli, discussed with GI recommending antibiotic trial-placed on ciprofloxacin-we will discuss with ID Dr Linus Salmons continue diet as tolerated, continue supportive care.  Patient will need GI follow-up   Hypokalemia resolved.   T2DM on insulin with hypoglycemia eating x1: A1c 6.9.  No recurrence of hypoglycemia after holding Lantus, continue SSI coverage.  Monitor  Recent Labs  Lab 10/23/21 1545 10/23/21 2005 10/24/21 0004 10/24/21 0432 10/24/21 0719  GLUCAP 113* 84 115* 104* 111*    Chronic pain syndrome-follow-up with pain clinic.  Continue pain control  History of cocaine abuse: She was counseled on cessation.She last used a week ago  Acute lower UTI continue Cipro for EAEC   DVT prophylaxis: SCDs Start: 10/23/21 0027 no chemical prophylaxis due to blood-tinged stool Code Status:   Code Status: Full Code Family Communication: plan of care discussed with patient at bedside. Status is: Inpatient Remains inpatient appropriate because: Ongoing abdominal pain and work-up of colitis Disposition: Currently not medically stable for discharge. Anticipated Disposition: Home  Objective: Vitals last 24 hrs: Vitals:   10/23/21 1103 10/23/21 1442 10/23/21 2003 10/24/21 0427  BP: (!) 156/109 (!) 158/102 (!) 149/103 (!) 157/116  Pulse: 87 92 97 99  Resp: 16 18 16 15   Temp: 98 F (36.7 C) 98.2 F (36.8 C) 98.3 F (36.8 C) 97.9 F (36.6 C)  TempSrc: Oral Oral Oral Oral  SpO2: 100% 100% 100% 97%  Weight:      Height:       Weight change:   Intake/Output Summary (Last 24 hours) at 10/24/2021 1157 Last data filed at 10/24/2021 1100 Gross per 24 hour  Intake 1445.83 ml  Output --  Net 1445.83 ml   Net IO Since Admission: 4,251.36  mL [10/24/21 1157]   Physical Examination: General exam: AAOx 3older than stated age, weak appearing. HEENT:Oral mucosa  moist, Ear/Nose WNL grossly, dentition normal. Respiratory system: bilaterally diminished, no use of accessory muscle Cardiovascular system: S1 & S2 +, No JVD,. Gastrointestinal system: Abdomen soft, tender lower quadrant,ND, BS+ Nervous System:Alert, awake, moving extremities and grossly nonfocal Extremities: No edema, distal peripheral pulses palpable.  Skin: No rashes,no icterus. MSK: Normal muscle bulk,tone, power   Medications reviewed:  Scheduled Meds:  dicyclomine  20 mg Oral BID   insulin aspart  0-9 Units Subcutaneous Q4H   nicotine  7 mg Transdermal Daily   pantoprazole (PROTONIX) IV  40 mg Intravenous Q24H   potassium chloride  10 mEq Oral Daily   Continuous Infusions:  ciprofloxacin 400 mg (10/24/21 0428)   dextrose 5 % and 0.45 % NaCl with KCl 20 mEq/L 1,000 mL (10/23/21 0858)   Diet Order             DIET SOFT Room service appropriate? Yes; Fluid consistency: Thin  Diet effective now                  Weight change:   Wt Readings from Last 3 Encounters:  10/22/21 59 kg  10/02/21 59 kg  09/27/21 59 kg  Consultants:see note  Procedures:see note Antimicrobials: Anti-infectives (From admission, onward)    Start     Dose/Rate Route Frequency Ordered Stop   10/23/21 1700  ciprofloxacin (CIPRO) IVPB 400 mg        400 mg 200 mL/hr over 60 Minutes Intravenous Every 12 hours 10/23/21 1612 10/28/21 1659   10/22/21 2300  cefTRIAXone (ROCEPHIN) 1 g in sodium chloride 0.9 % 100 mL IVPB  Status:  Discontinued        1 g 200 mL/hr over 30 Minutes Intravenous Every 24 hours 10/22/21 2208 10/23/21 1612      Culture/Microbiology Other culture-see note  Unresulted Labs (From admission, onward)     Start     Ordered   10/24/21 1155  Glucose, capillary  Once,   R        10/24/21 1155   10/24/21 0500  CBC  Daily,   R     Question:  Specimen collection method  Answer:  Lab=Lab collect   10/23/21 0758   10/24/21 4970  Basic metabolic panel  Daily,   R      Question:  Specimen collection method  Answer:  Lab=Lab collect   10/23/21 0758   10/22/21 2206  Urine Culture  Once,   R       Question:  Indication  Answer:  Dysuria   10/22/21 2205   10/22/21 2204  Urine rapid drug screen (hosp performed)  ONCE - STAT,   STAT        10/22/21 2204           Data Reviewed: I have personally reviewed following labs and imaging studies CBC: Recent Labs  Lab 10/22/21 0305 10/22/21 2303 10/23/21 0552 10/24/21 0551  WBC 7.2 4.6 5.0 4.1  NEUTROABS 5.4  --  3.0  --   HGB 12.5 11.9* 11.5* 11.0*  HCT 38.3 37.0 36.0 34.1*  MCV 83.3 85.6 86.3 85.3  PLT 426* 301 338 263   Basic Metabolic Panel: Recent Labs  Lab 10/22/21 0316 10/22/21 2303 10/23/21 0552 10/24/21 0551  NA 136 138 139 136  K 2.5* 3.2* 3.2* 3.5  CL 98 106 108 105  CO2 30 27 26 24   GLUCOSE 166*  165* 73 177*  BUN 10 6 <5* <5*  CREATININE 0.62 0.57 0.49 0.60  CALCIUM 8.7* 8.3* 8.4* 8.5*  MG  --  1.9 2.0  --   PHOS  --  3.3 3.4  --    GFR: Estimated Creatinine Clearance: 90.5 mL/min (by C-G formula based on SCr of 0.6 mg/dL). Liver Function Tests: Recent Labs  Lab 10/22/21 2303 10/23/21 0552  AST 10* 10*  ALT 10 10  ALKPHOS 64 57  BILITOT 0.5 0.4  PROT 7.0 6.7  ALBUMIN 3.1* 3.0*   Recent Labs  Lab 10/22/21 0305  LIPASE 31   No results for input(s): AMMONIA in the last 168 hours. Coagulation Profile: No results for input(s): INR, PROTIME in the last 168 hours. Cardiac Enzymes: Recent Labs  Lab 10/22/21 2303  CKTOTAL 39   BNP (last 3 results) No results for input(s): PROBNP in the last 8760 hours. HbA1C: Recent Labs    10/22/21 2348  HGBA1C 6.9*   CBG: Recent Labs  Lab 10/23/21 1545 10/23/21 2005 10/24/21 0004 10/24/21 0432 10/24/21 0719  GLUCAP 113* 84 115* 104* 111*   Lipid Profile: No results for input(s): CHOL, HDL, LDLCALC, TRIG, CHOLHDL, LDLDIRECT in the last 72 hours. Thyroid Function Tests: Recent Labs    10/23/21 0552  TSH 3.314    Anemia Panel: Recent Labs    10/23/21 0552  VITAMINB12 430  FOLATE 4.6*  FERRITIN 24  TIBC 327  IRON 49  RETICCTPCT 2.4   Sepsis Labs: No results for input(s): PROCALCITON, LATICACIDVEN in the last 168 hours.  Recent Results (from the past 240 hour(s))  Gastrointestinal Panel by PCR , Stool     Status: Abnormal   Collection Time: 10/22/21  3:18 AM   Specimen: STOOL  Result Value Ref Range Status   Campylobacter species NOT DETECTED NOT DETECTED Final   Plesimonas shigelloides NOT DETECTED NOT DETECTED Final   Salmonella species NOT DETECTED NOT DETECTED Final   Yersinia enterocolitica NOT DETECTED NOT DETECTED Final   Vibrio species NOT DETECTED NOT DETECTED Final   Vibrio cholerae NOT DETECTED NOT DETECTED Final   Enteroaggregative E coli (EAEC) DETECTED (A) NOT DETECTED Final    Comment: CRITICAL RESULT CALLED TO, READ BACK BY AND VERIFIED WITH: CINDY MILLAR RN @1555  10/23/21 SCS    Enteropathogenic E coli (EPEC) NOT DETECTED NOT DETECTED Final   Enterotoxigenic E coli (ETEC) NOT DETECTED NOT DETECTED Final   Shiga like toxin producing E coli (STEC) NOT DETECTED NOT DETECTED Final   Shigella/Enteroinvasive E coli (EIEC) NOT DETECTED NOT DETECTED Final   Cryptosporidium NOT DETECTED NOT DETECTED Final   Cyclospora cayetanensis NOT DETECTED NOT DETECTED Final   Entamoeba histolytica NOT DETECTED NOT DETECTED Final   Giardia lamblia NOT DETECTED NOT DETECTED Final   Adenovirus F40/41 NOT DETECTED NOT DETECTED Final   Astrovirus NOT DETECTED NOT DETECTED Final   Norovirus GI/GII NOT DETECTED NOT DETECTED Final   Rotavirus A NOT DETECTED NOT DETECTED Final   Sapovirus (I, II, IV, and V) NOT DETECTED NOT DETECTED Final    Comment: Performed at [facility], [address] [place], [place] [address]  C Difficile Quick Screen w PCR reflex     Status: None   Collection Time: 10/22/21  3:18 AM   Specimen: STOOL  Result Value Ref Range Status   C Diff antigen  NEGATIVE NEGATIVE Final   C Diff toxin NEGATIVE NEGATIVE Final   C Diff interpretation No C. difficile detected.  Final  Comment: Performed at [facility], [place] [address] [place], [place] [address]  Resp Panel by RT-PCR (Flu A&B, Covid) Nasopharyngeal Swab     Status: None   Collection Time: 10/22/21  4:51 AM   Specimen: Nasopharyngeal Swab; Nasopharyngeal(NP) swabs in vial transport medium  Result Value Ref Range Status   SARS Coronavirus 2 by RT PCR NEGATIVE NEGATIVE Final    Comment: (NOTE) SARS-CoV-2 target nucleic acids are NOT DETECTED.  The SARS-CoV-2 RNA is generally detectable in upper respiratory specimens during the acute phase of infection. The lowest concentration of SARS-CoV-2 viral copies this assay can detect is 138 copies/mL. A negative result does not preclude SARS-Cov-2 infection and should not be used as the sole basis for treatment or other patient management decisions. A negative result may occur with  improper specimen collection/handling, submission of specimen other than nasopharyngeal swab, presence of viral mutation(s) within the areas targeted by this assay, and inadequate number of viral copies(<138 copies/mL). A negative result must be combined with clinical observations, patient history, and epidemiological information. The expected result is Negative.  Fact Sheet for Patients:  EntrepreneurPulse.com.au  Fact Sheet for Healthcare Providers:  IncredibleEmployment.be  This test is no t yet approved or cleared by the [place] FDA and  has been authorized for detection and/or diagnosis of SARS-CoV-2 by FDA under an Emergency Use Authorization (EUA). This EUA will remain  in effect (meaning this test can be used) for the duration of the COVID-19 declaration under Section 564(b)(1) of the Act, 21 U.S.C.section 360bbb-3(b)(1), unless the authorization is terminated  or revoked sooner.       Influenza A  by PCR NEGATIVE NEGATIVE Final   Influenza B by PCR NEGATIVE NEGATIVE Final    Comment: (NOTE) The Xpert Xpress SARS-CoV-2/FLU/RSV plus assay is intended as an aid in the diagnosis of influenza from Nasopharyngeal swab specimens and should not be used as a sole basis for treatment. Nasal washings and aspirates are unacceptable for Xpert Xpress SARS-CoV-2/FLU/RSV testing.  Fact Sheet for Patients: EntrepreneurPulse.com.au  Fact Sheet for Healthcare Providers: IncredibleEmployment.be  This test is not yet approved or cleared by the [place] FDA and has been authorized for detection and/or diagnosis of SARS-CoV-2 by FDA under an Emergency Use Authorization (EUA). This EUA will remain in effect (meaning this test can be used) for the duration of the COVID-19 declaration under Section 564(b)(1) of the Act, 21 U.S.C. section 360bbb-3(b)(1), unless the authorization is terminated or revoked.  Performed at [facility], [address] [place],  [address]      Radiology Studies: CT ABDOMEN PELVIS WO CONTRAST  Result Date: 10/23/2021 CLINICAL DATA:  Left lower quadrant abdominal pain EXAM: CT ABDOMEN AND PELVIS WITHOUT CONTRAST TECHNIQUE: Multidetector CT imaging of the abdomen and pelvis was performed following the standard protocol without IV contrast. COMPARISON:  10/02/2021, 09/27/2021 FINDINGS: Lower chest: Nodular infiltrate has developed within the a lingula, best seen on image # 5/4, likely infectious or inflammatory. The visualized lung bases are otherwise clear. Visualized heart and pericardium are unremarkable. Hepatobiliary: No focal liver abnormality is seen. No gallstones, gallbladder wall thickening, or biliary dilatation. Pancreas: Unremarkable Spleen: Unremarkable Adrenals/Urinary Tract: Adrenal glands are unremarkable. Kidneys are normal, without renal calculi, focal lesion, or hydronephrosis. Bladder is unremarkable.  Stomach/Bowel: Since the prior examination, there has developed extensive pericolonic inflammatory stranding involving the transverse, descending, and rectosigmoid colon in keeping with an infectious or inflammatory proctocolitis. These changes appear progressive since immediate prior  examination and appear more severe than on remote prior examination of 09/19/2021 where this was first identified. No evidence of obstruction. No free intraperitoneal gas or fluid. Stomach and small bowel are unremarkable. The appendix is not clearly identified and may be absent. Vascular/Lymphatic: No significant vascular findings are present. No enlarged abdominal or pelvic lymph nodes. Reproductive: Intrauterine device in expected position within the endometrial cavity. The pelvic organs are otherwise unremarkable. Other: No abdominal wall hernia. Musculoskeletal: No acute bone abnormality. No lytic or blastic bone lesion. IMPRESSION: Recurrent inflammatory changes in keeping with extensive proctocolitis. This appears progressive since immediate prior examination and more severe than remote prior examination of 09/19/2021. Given the history of recent antibiotic therapy, pseudomembranous colitis should be considered. Alternatively, inflammatory conditions such as ulcerative colitis could appear in this fashion. No evidence of obstruction or perforation. Minimal nodular infiltrate within the lingula, nonspecific, possibly infectious or inflammatory. Electronically Signed   By: Fidela Salisbury M.D.   On: 10/23/2021 04:04     LOS: 2 days   Antonieta Pert, MD [facility]  10/24/2021, 11:57 AM

## 2021-10-24 NOTE — Progress Notes (Signed)
Initial Nutrition Assessment  INTERVENTION:   -Prosource Plus PO TID, each provides 100 kcals and 15g protein  NUTRITION DIAGNOSIS:   Inadequate oral intake related to poor appetite (abdominal pain) as evidenced by per patient/family report.  GOAL:   Patient will meet greater than or equal to 90% of their needs  MONITOR:   PO intake, Supplement acceptance, Weight trends, I & O's, Labs  REASON FOR ASSESSMENT:   Consult Other (Comment)  ASSESSMENT:   36 y.o. female with PMH of colitis, IBS with chronic abdominal pain, anxiety disorder, anemia remote history of cocaine abuse diabetes mellitus on insulin, history of SBO, history of peritonitis, history of PE who has been having abdominal pain and back pain and loose "jellylike" stool for a month, at times containing blood, has had multiple ED visits 4 times since 09/17/2021 and CT of the abdomen pelvis on 11/28 showed bowel wall thickening and slight pericolonic inflammatory haziness involving the ascending and transverse colon-infectious or inflammatory colitis  Patient in room, very happy to be on a solid diet. Reports she still had diarrhea afterwards but she has had no nausea. Ate pot roast and mashed potatoes.  States she was managing her blood sugars well at home. Hesitant to try protein shakes but was agreeable to Prosource which is more like medicine.   Did review some foods for pt to try to help bulk up her stool.  Per weight records, pt has lost 19 lbs since 6/24 (12% wt loss x 6 months, significant for time frame).   Medications: Bentyl, KLOR-CON, D5 infusion  Labs reviewed: CBGs: 71-138  Low folate (4.6)  NUTRITION - FOCUSED PHYSICAL EXAM:  No depletions noted.  Diet Order:   Diet Order             DIET SOFT Room service appropriate? Yes; Fluid consistency: Thin  Diet effective now                   EDUCATION NEEDS:   Education needs have been addressed  Skin:  Skin Assessment: Reviewed RN  Assessment  Last BM:  1/2  Height:   Ht Readings from Last 1 Encounters:  10/22/21 5\' 7"  (1.702 m)    Weight:   Wt Readings from Last 1 Encounters:  10/22/21 59 kg    BMI:  Body mass index is 20.37 kg/m.  Estimated Nutritional Needs:   Kcal:  1550-1750  Protein:  70-85g  Fluid:  1.8L/day   Clayton Bibles, MS, RD, LDN Inpatient Clinical Dietitian Contact information available via Amion

## 2021-10-25 ENCOUNTER — Encounter (HOSPITAL_COMMUNITY): Payer: Self-pay | Admitting: Internal Medicine

## 2021-10-25 DIAGNOSIS — K529 Noninfective gastroenteritis and colitis, unspecified: Secondary | ICD-10-CM | POA: Diagnosis not present

## 2021-10-25 LAB — BASIC METABOLIC PANEL
Anion gap: 8 (ref 5–15)
BUN: 7 mg/dL (ref 6–20)
CO2: 25 mmol/L (ref 22–32)
Calcium: 8.7 mg/dL — ABNORMAL LOW (ref 8.9–10.3)
Chloride: 104 mmol/L (ref 98–111)
Creatinine, Ser: 0.84 mg/dL (ref 0.44–1.00)
GFR, Estimated: >60 mL/min (ref >60)
Glucose, Bld: 152 mg/dL — ABNORMAL HIGH (ref 70–99)
Potassium: 3.5 mmol/L (ref 3.5–5.1)
Sodium: 137 mmol/L (ref 135–145)

## 2021-10-25 LAB — CBC
HCT: 35.5 % — ABNORMAL LOW (ref 36.0–46.0)
Hemoglobin: 11.8 g/dL — ABNORMAL LOW (ref 12.0–15.0)
MCH: 27.5 pg (ref 26.0–34.0)
MCHC: 33.2 g/dL (ref 30.0–36.0)
MCV: 82.8 fL (ref 80.0–100.0)
Platelets: 313 10*3/uL (ref 150–400)
RBC: 4.29 MIL/uL (ref 3.87–5.11)
RDW: 13.7 % (ref 11.5–15.5)
WBC: 5.7 10*3/uL (ref 4.0–10.5)
nRBC: 0 % (ref 0.0–0.2)

## 2021-10-25 LAB — GLUCOSE, CAPILLARY
Glucose-Capillary: 120 mg/dL — ABNORMAL HIGH (ref 70–99)
Glucose-Capillary: 128 mg/dL — ABNORMAL HIGH (ref 70–99)
Glucose-Capillary: 135 mg/dL — ABNORMAL HIGH (ref 70–99)
Glucose-Capillary: 136 mg/dL — ABNORMAL HIGH (ref 70–99)
Glucose-Capillary: 142 mg/dL — ABNORMAL HIGH (ref 70–99)
Glucose-Capillary: 147 mg/dL — ABNORMAL HIGH (ref 70–99)

## 2021-10-25 MED ORDER — INSULIN ASPART 100 UNIT/ML IJ SOLN
0.0000 [IU] | Freq: Three times a day (TID) | INTRAMUSCULAR | Status: DC
  Administered 2021-10-25 (×2): 1 [IU] via SUBCUTANEOUS
  Administered 2021-10-26 – 2021-10-27 (×5): 2 [IU] via SUBCUTANEOUS
  Administered 2021-10-28: 1 [IU] via SUBCUTANEOUS

## 2021-10-25 MED ORDER — AMLODIPINE BESYLATE 5 MG PO TABS
5.0000 mg | ORAL_TABLET | Freq: Every day | ORAL | Status: DC
  Administered 2021-10-25 – 2021-10-28 (×4): 5 mg via ORAL
  Filled 2021-10-25 (×4): qty 1

## 2021-10-25 MED ORDER — INSULIN ASPART 100 UNIT/ML IJ SOLN: 0.0000 [IU] | Freq: Every day | INTRAMUSCULAR | Status: DC

## 2021-10-25 NOTE — Consult Note (Signed)
Reason for Consult: Questionable infectious colitis Referring Physician: Hospital team  Jodi Mcgee is an 36 y.o. female.  HPI: Patient seen and examined and hospital computer chart reviewed and case discussed multiple times with the hospital team and although her diarrhea does seem to be a little better today her abdominal pain is a little better and she wants to eat and she has a moderately long history of what was told IBS and they tried to do a colonoscopy about a year ago more for pain and constipation but she says her prep was not very good but no diagnosis was made but her mom does have Crohn's disease and her 3 previous colonoscopies all had different findings but no one else in the family has been sick lately and in between her ER visits she was okay for about a week and her diarrhea did not start until 2 weeks ago and she has no other complaints  Past Medical History:  Diagnosis Date   Anemia    Anxiety    Blood transfusion without reported diagnosis    both CS   Cocaine abuse ([place])    Diabetes mellitus without complication ([place])    GERD (gastroesophageal reflux disease)    has resolved   IBS (irritable bowel syndrome)    Ovarian cyst    Peritonitis, acute generalized ([place])    Pulmonary embolism ([place])    SBO (small bowel obstruction) ([place])    Sepsis ([place])     Past Surgical History:  Procedure Laterality Date   APPENDECTOMY     ruptured    BUNIONECTOMY     CESAREAN SECTION     CESAREAN SECTION Bilateral 11/20/2017   Procedure: CESAREAN SECTION;  Surgeon: Sloan Leiter, MD;  Location: [facility];  Service: Obstetrics;  Laterality: Bilateral;   OVARIAN CYST DRAINAGE     SMALL BOWEL REPAIR      Family History  Problem Relation Age of Onset   Hypertension Mother    Anemia Mother    Diabetes Father     Social History:  reports that she has been smoking cigarettes. She has been smoking an average of .15 packs per day. She has never used smokeless tobacco.  She reports that she does not currently use alcohol. She reports that she does not currently use drugs.  Allergies:  Allergies  Allergen Reactions   Hydrocodone Itching   Iodinated Contrast Media Itching and Other (See Comments)    Mild itching after IV contrast on 06/01/2017No urticaria visible, wheezing, or angioedema  Mild itching after IV contrast on 06/01/2017No urticaria visible, wheezing, or angioedema   Cetirizine & Related Itching and Swelling   Toradol [Ketorolac Tromethamine] Hives and Itching   Flagyl [Metronidazole] Itching and Swelling   Keflex [Cephalexin]    Nsaids Rash    Medications: I have reviewed the patient's current medications.  Results for orders placed or performed during the hospital encounter of 10/22/21 (from the past 48 hour(s))  Glucose, capillary     Status: Abnormal   Collection Time: 10/23/21 11:39 AM  Result Value Ref Range   Glucose-Capillary 108 (H) 70 - 99 mg/dL    Comment: Glucose reference range applies only to samples taken after fasting for at least 8 hours.  Glucose, capillary     Status: Abnormal   Collection Time: 10/23/21  3:45 PM  Result Value Ref Range   Glucose-Capillary 113 (H) 70 - 99 mg/dL    Comment: Glucose reference range applies only to samples taken  after fasting for at least 8 hours.  Glucose, capillary     Status: None   Collection Time: 10/23/21  8:05 PM  Result Value Ref Range   Glucose-Capillary 84 70 - 99 mg/dL    Comment: Glucose reference range applies only to samples taken after fasting for at least 8 hours.  Glucose, capillary     Status: Abnormal   Collection Time: 10/24/21 12:04 AM  Result Value Ref Range   Glucose-Capillary 115 (H) 70 - 99 mg/dL    Comment: Glucose reference range applies only to samples taken after fasting for at least 8 hours.  Glucose, capillary     Status: Abnormal   Collection Time: 10/24/21  4:32 AM  Result Value Ref Range   Glucose-Capillary 104 (H) 70 - 99 mg/dL    Comment:  Glucose reference range applies only to samples taken after fasting for at least 8 hours.  CBC     Status: Abnormal   Collection Time: 10/24/21  5:51 AM  Result Value Ref Range   WBC 4.1 4.0 - 10.5 K/uL   RBC 4.00 3.87 - 5.11 MIL/uL   Hemoglobin 11.0 (L) 12.0 - 15.0 g/dL   HCT 34.1 (L) 36.0 - 46.0 %   MCV 85.3 80.0 - 100.0 fL   MCH 27.5 26.0 - 34.0 pg   MCHC 32.3 30.0 - 36.0 g/dL   RDW 13.8 11.5 - 15.5 %   Platelets 275 150 - 400 K/uL   nRBC 0.0 0.0 - 0.2 %    Comment: Performed at [facility], [place] [address] [place], [place] [address]  Basic metabolic panel     Status: Abnormal   Collection Time: 10/24/21  5:51 AM  Result Value Ref Range   Sodium 136 135 - 145 mmol/L   Potassium 3.5 3.5 - 5.1 mmol/L   Chloride 105 98 - 111 mmol/L   CO2 24 22 - 32 mmol/L   Glucose, Bld 177 (H) 70 - 99 mg/dL    Comment: Glucose reference range applies only to samples taken after fasting for at least 8 hours.   BUN <5 (L) 6 - 20 mg/dL   Creatinine, Ser 0.60 0.44 - 1.00 mg/dL   Calcium 8.5 (L) 8.9 - 10.3 mg/dL   GFR, Estimated >60 >60 mL/min    Comment: (NOTE) Calculated using the CKD-EPI Creatinine Equation (2021)    Anion gap 7 5 - 15    Comment: Performed at [facility], [place] [address] [place], [place] [address]  Glucose, capillary     Status: Abnormal   Collection Time: 10/24/21  7:19 AM  Result Value Ref Range   Glucose-Capillary 111 (H) 70 - 99 mg/dL    Comment: Glucose reference range applies only to samples taken after fasting for at least 8 hours.  Glucose, capillary     Status: Abnormal   Collection Time: 10/24/21 11:55 AM  Result Value Ref Range   Glucose-Capillary 138 (H) 70 - 99 mg/dL    Comment: Glucose reference range applies only to samples taken after fasting for at least 8 hours.  Glucose, capillary     Status: None   Collection Time: 10/24/21  3:47 PM  Result Value Ref Range   Glucose-Capillary 98 70 - 99 mg/dL    Comment:  Glucose reference range applies only to samples taken after fasting for at least 8 hours.  Glucose, capillary     Status: Abnormal   Collection Time: 10/24/21  8:27 PM  Result  Value Ref Range   Glucose-Capillary 136 (H) 70 - 99 mg/dL    Comment: Glucose reference range applies only to samples taken after fasting for at least 8 hours.  Glucose, capillary     Status: Abnormal   Collection Time: 10/25/21 12:36 AM  Result Value Ref Range   Glucose-Capillary 142 (H) 70 - 99 mg/dL    Comment: Glucose reference range applies only to samples taken after fasting for at least 8 hours.  Glucose, capillary     Status: Abnormal   Collection Time: 10/25/21  4:12 AM  Result Value Ref Range   Glucose-Capillary 136 (H) 70 - 99 mg/dL    Comment: Glucose reference range applies only to samples taken after fasting for at least 8 hours.  CBC     Status: Abnormal   Collection Time: 10/25/21  4:53 AM  Result Value Ref Range   WBC 5.7 4.0 - 10.5 K/uL   RBC 4.29 3.87 - 5.11 MIL/uL   Hemoglobin 11.8 (L) 12.0 - 15.0 g/dL   HCT 35.5 (L) 36.0 - 46.0 %   MCV 82.8 80.0 - 100.0 fL   MCH 27.5 26.0 - 34.0 pg   MCHC 33.2 30.0 - 36.0 g/dL   RDW 13.7 11.5 - 15.5 %   Platelets 313 150 - 400 K/uL   nRBC 0.0 0.0 - 0.2 %    Comment: Performed at [facility], [place] [address] [place], [place] [address]  Basic metabolic panel     Status: Abnormal   Collection Time: 10/25/21  4:53 AM  Result Value Ref Range   Sodium 137 135 - 145 mmol/L   Potassium 3.5 3.5 - 5.1 mmol/L   Chloride 104 98 - 111 mmol/L   CO2 25 22 - 32 mmol/L   Glucose, Bld 152 (H) 70 - 99 mg/dL    Comment: Glucose reference range applies only to samples taken after fasting for at least 8 hours.   BUN 7 6 - 20 mg/dL   Creatinine, Ser 0.84 0.44 - 1.00 mg/dL   Calcium 8.7 (L) 8.9 - 10.3 mg/dL   GFR, Estimated >60 >60 mL/min    Comment: (NOTE) Calculated using the CKD-EPI Creatinine Equation (2021)    Anion gap 8 5 - 15    Comment:  Performed at [facility], [place] [address], [place], [place] [address]  Glucose, capillary     Status: Abnormal   Collection Time: 10/25/21  7:32 AM  Result Value Ref Range   Glucose-Capillary 120 (H) 70 - 99 mg/dL    Comment: Glucose reference range applies only to samples taken after fasting for at least 8 hours.   Comment 1 Notify RN    Comment 2 Document in Chart     No results found.  Review of Systems negative except above Blood pressure (!) 161/116, pulse 100, temperature 98.3 F (36.8 C), temperature source Oral, resp. rate 18, height 5\' 7"  (1.702 m), weight 59 kg, SpO2 100 %. Physical Exam vital signs stable afebrile no acute distress in good spirits abdomen is soft very minimal lower discomfort no guarding or rebound labs CT stool studies all reviewed  Assessment/Plan: Questionable infectious colitis versus underlying IBD Plan: We will asked my partner Dr. Michail Sermon to check on tomorrow and if no better proceed with flex sig or colonoscopy on Thursday otherwise if seemingly better can follow-up with her [facility] gastroenterologist as an outpatient but probably would benefit from a repeat colonoscopy at some point to specifically rule  out IBD and 1 could consider additional stool studies or even blood tests to rule that out  [facility] E 10/25/2021, 11:29 AM

## 2021-10-25 NOTE — Progress Notes (Signed)
PROGRESS NOTE    Jodi Mcgee  PPI:[id number] DOB: [birth year] DOA: 10/22/2021 PCP: [facility]   Chief Complaint  Patient presents with   Abdominal Pain   Diarrhea   Brief Narrative/Hospital Course: Jodi Mcgee, 36 y.o. female with PMH of colitis, IBS with chronic abdominal pain, anxiety disorder, anemia remote history of cocaine abuse diabetes mellitus on insulin, history of SBO, history of peritonitis, history of PE who has been having abdominal pain and back pain and loose "jellylike" stool for a month, at times containing blood, has had multiple ED visits 4 times since 09/17/2021 and CT of the abdomen pelvis on 11/28 showed bowel wall thickening and slight pericolonic inflammatory haziness involving the ascending and transverse colon-infectious or inflammatory colitis, repeat CT on 12/6 similar finding, discharged on dicyclomine, Imodium.  CT on 12/11 showed no bowel wall thickening In the ED vitals stable lab with hypokalemia stool test negative for C. difficile, Hemoccult test positive GI panel sent out, CT abdomen pelvis with recurrent inflammatory changes with extensive proctocolitis progressive since 11/28, given recent antibiotic therapy pseudomembranous colitis should be considered, alternatively inflammatory condition like ulcerative colitis could appear in this fashion, minimal nodular infiltrate in the lingula. She was placed on IV fluids ceftriaxone and admitted for further management  Subjective: Seen and examined this morning, has had multiple bowel movements No nausea vomiting, has ongoing abdominal pain  Assessment & Plan:  Proctocolitis Abdominal pain Hemoccult positive Blood in the stool EAEC gastroenteritis: Imaging with proctocolitis could be infectious, inflammatory, IBD .recurrent ED visits, imaging with proctocolitis progressive since previous imaging. "Jellylike" stool, heme positive.  C. difficile negative, GIP-  positive EAEC-discussed with Dr. Dwyane Dee continue with ciprofloxacin he advised for 3 days.given ongoing symptoms I have requested Dr. Watt Climes to see this patient.  Hypertension :blood pressure poorly controlled, started amlodipine 5 mg.  Patient reports she has not amlodipine previously-came up after blood pressure got better.  Hypokalemia resolved.   T2DM on insulin with hypoglycemia eating x1: A1c 6.9.  Recurrence of hypoglycemia after holding Lantus, continue SSI coverage.  Monitor  Recent Labs  Lab 10/24/21 1547 10/24/21 2027 10/25/21 0036 10/25/21 0412 10/25/21 0732  GLUCAP 98 136* 142* 136* 120*     Chronic pain syndrome-continue on pain management.  She will follow-up with pain clinic.  History of cocaine abuse: She was counseled on cessation.She last used a week ago.  Acute lower UTI: Continue Cipro for EAEC   DVT prophylaxis: SCDs Start: 10/23/21 0027 no chemical prophylaxis due to blood-tinged stool Code Status:   Code Status: Full Code Family Communication: plan of care discussed with patient at bedside. Status is: Inpatient Remains inpatient appropriate because: Ongoing abdominal pain and work-up of colitis Disposition: Currently not medically stable for discharge. Anticipated Disposition: Home  Objective: Vitals last 24 hrs: Vitals:   10/24/21 2019 10/25/21 0034 10/25/21 0409 10/25/21 1054  BP: (!) 167/107 (!) 159/113 (!) 165/114 (!) 161/116  Pulse: (!) 110 (!) 106 98 100  Resp: 15 18 18 18   Temp: 98.1 F (36.7 C) 98.3 F (36.8 C) 98 F (36.7 C) 98.3 F (36.8 C)  TempSrc: Oral Oral Oral Oral  SpO2: 100% 100% 99% 100%  Weight:      Height:       Weight change:   Intake/Output Summary (Last 24 hours) at 10/25/2021 1105 Last data filed at 10/25/2021 0129 Gross per 24 hour  Intake 400 ml  Output --  Net 400 ml  Net IO Since Admission: 4,651.36 mL [10/25/21 1105]   Physical Examination: General exam: AAOx 3,older than stated age, weak  appearing. HEENT:Oral mucosa moist, Ear/Nose WNL grossly, dentition normal. Respiratory system: bilaterally clear, no use of accessory muscle Cardiovascular system: S1 & S2 +, No JVD,. Gastrointestinal system: Abdomen soft,Tender lower abdomen,ND, BS+ Nervous System:Alert, awake, moving extremities and grossly nonfocal Extremities: no edema, distal peripheral pulses palpable.  Skin: No rashes,no icterus. MSK: Normal muscle bulk,tone, power    Medications reviewed:  Scheduled Meds:  amLODipine  5 mg Oral Daily   dicyclomine  20 mg Oral BID   feeding supplement (GLUCERNA SHAKE)  237 mL Oral TID BM   insulin aspart  0-5 Units Subcutaneous QHS   insulin aspart  0-9 Units Subcutaneous TID WC   nicotine  7 mg Transdermal Daily   pantoprazole (PROTONIX) IV  40 mg Intravenous Q24H   potassium chloride  10 mEq Oral Daily   Continuous Infusions:  ciprofloxacin 400 mg (10/25/21 0512)   dextrose 5 % and 0.45 % NaCl with KCl 20 mEq/L 50 mL/hr at 10/25/21 7681   Diet Order             DIET SOFT Room service appropriate? Yes; Fluid consistency: Thin  Diet effective now                  Weight change:   Wt Readings from Last 3 Encounters:  10/22/21 59 kg  10/02/21 59 kg  09/27/21 59 kg  Consultants:see note  Procedures:see note Antimicrobials: Anti-infectives (From admission, onward)    Start     Dose/Rate Route Frequency Ordered Stop   10/23/21 1700  ciprofloxacin (CIPRO) IVPB 400 mg        400 mg 200 mL/hr over 60 Minutes Intravenous Every 12 hours 10/23/21 1612 10/28/21 1659   10/22/21 2300  cefTRIAXone (ROCEPHIN) 1 g in sodium chloride 0.9 % 100 mL IVPB  Status:  Discontinued        1 g 200 mL/hr over 30 Minutes Intravenous Every 24 hours 10/22/21 2208 10/23/21 1612      Culture/Microbiology Other culture-see note  Unresulted Labs (From admission, onward)     Start     Ordered   10/24/21 0500  CBC  Daily,   R     Question:  Specimen collection method  Answer:   Lab=Lab collect   10/23/21 0758   10/24/21 1572  Basic metabolic panel  Daily,   R     Question:  Specimen collection method  Answer:  Lab=Lab collect   10/23/21 0758   10/22/21 2206  Urine Culture  Once,   R       Question:  Indication  Answer:  Dysuria   10/22/21 2205   10/22/21 2204  Urine rapid drug screen (hosp performed)  ONCE - STAT,   STAT        10/22/21 2204           Data Reviewed: I have personally reviewed following labs and imaging studies CBC: Recent Labs  Lab 10/22/21 0305 10/22/21 2303 10/23/21 0552 10/24/21 0551 10/25/21 0453  WBC 7.2 4.6 5.0 4.1 5.7  NEUTROABS 5.4  --  3.0  --   --   HGB 12.5 11.9* 11.5* 11.0* 11.8*  HCT 38.3 37.0 36.0 34.1* 35.5*  MCV 83.3 85.6 86.3 85.3 82.8  PLT 426* 301 338 275 620    Basic Metabolic Panel: Recent Labs  Lab 10/22/21 0316 10/22/21 2303 10/23/21 0552 10/24/21 0551 10/25/21  0453  NA 136 138 139 136 137  K 2.5* 3.2* 3.2* 3.5 3.5  CL 98 106 108 105 104  CO2 30 27 26 24 25   GLUCOSE 166* 165* 73 177* 152*  BUN 10 6 <5* <5* 7  CREATININE 0.62 0.57 0.49 0.60 0.84  CALCIUM 8.7* 8.3* 8.4* 8.5* 8.7*  MG  --  1.9 2.0  --   --   PHOS  --  3.3 3.4  --   --     GFR: Estimated Creatinine Clearance: 86.2 mL/min (by C-G formula based on SCr of 0.84 mg/dL). Liver Function Tests: Recent Labs  Lab 10/22/21 2303 10/23/21 0552  AST 10* 10*  ALT 10 10  ALKPHOS 64 57  BILITOT 0.5 0.4  PROT 7.0 6.7  ALBUMIN 3.1* 3.0*    Recent Labs  Lab 10/22/21 0305  LIPASE 31    No results for input(s): AMMONIA in the last 168 hours. Coagulation Profile: No results for input(s): INR, PROTIME in the last 168 hours. Cardiac Enzymes: Recent Labs  Lab 10/22/21 2303  CKTOTAL 39    BNP (last 3 results) No results for input(s): PROBNP in the last 8760 hours. HbA1C: Recent Labs    10/22/21 2348  HGBA1C 6.9*    CBG: Recent Labs  Lab 10/24/21 1547 10/24/21 2027 10/25/21 0036 10/25/21 0412 10/25/21 0732  GLUCAP  98 136* 142* 136* 120*    Lipid Profile: No results for input(s): CHOL, HDL, LDLCALC, TRIG, CHOLHDL, LDLDIRECT in the last 72 hours. Thyroid Function Tests: Recent Labs    10/23/21 0552  TSH 3.314    Anemia Panel: Recent Labs    10/23/21 0552  VITAMINB12 430  FOLATE 4.6*  FERRITIN 24  TIBC 327  IRON 49  RETICCTPCT 2.4    Sepsis Labs: No results for input(s): PROCALCITON, LATICACIDVEN in the last 168 hours.  Recent Results (from the past 240 hour(s))  Gastrointestinal Panel by PCR , Stool     Status: Abnormal   Collection Time: 10/22/21  3:18 AM   Specimen: STOOL  Result Value Ref Range Status   Campylobacter species NOT DETECTED NOT DETECTED Final   Plesimonas shigelloides NOT DETECTED NOT DETECTED Final   Salmonella species NOT DETECTED NOT DETECTED Final   Yersinia enterocolitica NOT DETECTED NOT DETECTED Final   Vibrio species NOT DETECTED NOT DETECTED Final   Vibrio cholerae NOT DETECTED NOT DETECTED Final   Enteroaggregative E coli (EAEC) DETECTED (A) NOT DETECTED Final    Comment: CRITICAL RESULT CALLED TO, READ BACK BY AND VERIFIED WITH: CINDY MILLAR RN @1555  10/23/21 SCS    Enteropathogenic E coli (EPEC) NOT DETECTED NOT DETECTED Final   Enterotoxigenic E coli (ETEC) NOT DETECTED NOT DETECTED Final   Shiga like toxin producing E coli (STEC) NOT DETECTED NOT DETECTED Final   Shigella/Enteroinvasive E coli (EIEC) NOT DETECTED NOT DETECTED Final   Cryptosporidium NOT DETECTED NOT DETECTED Final   Cyclospora cayetanensis NOT DETECTED NOT DETECTED Final   Entamoeba histolytica NOT DETECTED NOT DETECTED Final   Giardia lamblia NOT DETECTED NOT DETECTED Final   Adenovirus F40/41 NOT DETECTED NOT DETECTED Final   Astrovirus NOT DETECTED NOT DETECTED Final   Norovirus GI/GII NOT DETECTED NOT DETECTED Final   Rotavirus A NOT DETECTED NOT DETECTED Final   Sapovirus (I, II, IV, and V) NOT DETECTED NOT DETECTED Final    Comment: Performed at [facility], [address] [place], [place] [address]  C Difficile Quick Screen w PCR reflex  Status: None   Collection Time: 10/22/21  3:18 AM   Specimen: STOOL  Result Value Ref Range Status   C Diff antigen NEGATIVE NEGATIVE Final   C Diff toxin NEGATIVE NEGATIVE Final   C Diff interpretation No C. difficile detected.  Final    Comment: Performed at [facility], [place] [address] [place], [place] [address]  Resp Panel by RT-PCR (Flu A&B, Covid) Nasopharyngeal Swab     Status: None   Collection Time: 10/22/21  4:51 AM   Specimen: Nasopharyngeal Swab; Nasopharyngeal(NP) swabs in vial transport medium  Result Value Ref Range Status   SARS Coronavirus 2 by RT PCR NEGATIVE NEGATIVE Final    Comment: (NOTE) SARS-CoV-2 target nucleic acids are NOT DETECTED.  The SARS-CoV-2 RNA is generally detectable in upper respiratory specimens during the acute phase of infection. The lowest concentration of SARS-CoV-2 viral copies this assay can detect is 138 copies/mL. A negative result does not preclude SARS-Cov-2 infection and should not be used as the sole basis for treatment or other patient management decisions. A negative result may occur with  improper specimen collection/handling, submission of specimen other than nasopharyngeal swab, presence of viral mutation(s) within the areas targeted by this assay, and inadequate number of viral copies(<138 copies/mL). A negative result must be combined with clinical observations, patient history, and epidemiological information. The expected result is Negative.  Fact Sheet for Patients:  EntrepreneurPulse.com.au  Fact Sheet for Healthcare Providers:  IncredibleEmployment.be  This test is no t yet approved or cleared by the [place] FDA and  has been authorized for detection and/or diagnosis of SARS-CoV-2 by FDA under an Emergency Use Authorization (EUA). This EUA will remain  in effect (meaning this test can be  used) for the duration of the COVID-19 declaration under Section 564(b)(1) of the Act, 21 U.S.C.section 360bbb-3(b)(1), unless the authorization is terminated  or revoked sooner.       Influenza A by PCR NEGATIVE NEGATIVE Final   Influenza B by PCR NEGATIVE NEGATIVE Final    Comment: (NOTE) The Xpert Xpress SARS-CoV-2/FLU/RSV plus assay is intended as an aid in the diagnosis of influenza from Nasopharyngeal swab specimens and should not be used as a sole basis for treatment. Nasal washings and aspirates are unacceptable for Xpert Xpress SARS-CoV-2/FLU/RSV testing.  Fact Sheet for Patients: EntrepreneurPulse.com.au  Fact Sheet for Healthcare Providers: IncredibleEmployment.be  This test is not yet approved or cleared by the [place] FDA and has been authorized for detection and/or diagnosis of SARS-CoV-2 by FDA under an Emergency Use Authorization (EUA). This EUA will remain in effect (meaning this test can be used) for the duration of the COVID-19 declaration under Section 564(b)(1) of the Act, 21 U.S.C. section 360bbb-3(b)(1), unless the authorization is terminated or revoked.  Performed at [facility], [address] [place], [place] [address]       Radiology Studies: No results found.   LOS: 3 days   Antonieta Pert, MD [facility]  10/25/2021, 11:05 AM

## 2021-10-25 NOTE — Progress Notes (Signed)
Report given and care turned over to [facility] B. RN at this time.

## 2021-10-26 ENCOUNTER — Encounter (HOSPITAL_COMMUNITY): Payer: Self-pay | Admitting: Internal Medicine

## 2021-10-26 DIAGNOSIS — K529 Noninfective gastroenteritis and colitis, unspecified: Secondary | ICD-10-CM | POA: Diagnosis not present

## 2021-10-26 LAB — URINALYSIS, ROUTINE W REFLEX MICROSCOPIC
Bilirubin Urine: NEGATIVE
Glucose, UA: NEGATIVE mg/dL
Hgb urine dipstick: NEGATIVE
Ketones, ur: NEGATIVE mg/dL
Leukocytes,Ua: NEGATIVE
Nitrite: NEGATIVE
Protein, ur: NEGATIVE mg/dL
Specific Gravity, Urine: 1.01 (ref 1.005–1.030)
pH: 6 (ref 5.0–8.0)

## 2021-10-26 LAB — BASIC METABOLIC PANEL
Anion gap: 5 (ref 5–15)
BUN: 14 mg/dL (ref 6–20)
CO2: 28 mmol/L (ref 22–32)
Calcium: 8.8 mg/dL — ABNORMAL LOW (ref 8.9–10.3)
Chloride: 103 mmol/L (ref 98–111)
Creatinine, Ser: 0.65 mg/dL (ref 0.44–1.00)
GFR, Estimated: >60 mL/min (ref >60)
Glucose, Bld: 141 mg/dL — ABNORMAL HIGH (ref 70–99)
Potassium: 4 mmol/L (ref 3.5–5.1)
Sodium: 136 mmol/L (ref 135–145)

## 2021-10-26 LAB — CBC
HCT: 37.7 % (ref 36.0–46.0)
Hemoglobin: 12.1 g/dL (ref 12.0–15.0)
MCH: 27.6 pg (ref 26.0–34.0)
MCHC: 32.1 g/dL (ref 30.0–36.0)
MCV: 86.1 fL (ref 80.0–100.0)
Platelets: 315 10*3/uL (ref 150–400)
RBC: 4.38 MIL/uL (ref 3.87–5.11)
RDW: 13.9 % (ref 11.5–15.5)
WBC: 5.6 10*3/uL (ref 4.0–10.5)
nRBC: 0 % (ref 0.0–0.2)

## 2021-10-26 LAB — GLUCOSE, CAPILLARY
Glucose-Capillary: 165 mg/dL — ABNORMAL HIGH (ref 70–99)
Glucose-Capillary: 156 mg/dL — ABNORMAL HIGH (ref 70–99)
Glucose-Capillary: 196 mg/dL — ABNORMAL HIGH (ref 70–99)
Glucose-Capillary: 143 mg/dL — ABNORMAL HIGH (ref 70–99)

## 2021-10-26 MED ORDER — POLYETHYLENE GLYCOL 3350 17 GM/SCOOP PO POWD
1.0000 | Freq: Once | ORAL | Status: AC
  Administered 2021-10-26: 255 g via ORAL
  Filled 2021-10-26: qty 255

## 2021-10-26 MED ORDER — OXYCODONE HCL 5 MG PO TABS
5.0000 mg | ORAL_TABLET | ORAL | Status: DC | PRN
  Administered 2021-10-26 – 2021-10-28 (×12): 5 mg via ORAL
  Filled 2021-10-26 (×13): qty 1

## 2021-10-26 MED ORDER — SODIUM CHLORIDE 0.9 % IV SOLN: INTRAVENOUS | Status: DC

## 2021-10-26 NOTE — Plan of Care (Signed)
  Problem: Clinical Measurements: Goal: Diagnostic test results will improve Outcome: Progressing   Problem: Coping: Goal: Level of anxiety will decrease Outcome: Progressing   

## 2021-10-26 NOTE — Progress Notes (Signed)
[facility] Progress Note  Jodi Mcgee 36 y.o. [birth year]   Subjective: Reports profuse jelly-like stools without bleeding (not documented in chart). Ongoing abdominal pain.  Objective: Vital signs: Vitals:   10/26/21 0159 10/26/21 0546  BP: (!) 154/104 (!) 162/116  Pulse: (!) 108 (!) 110  Resp: 18 18  Temp: 97.8 F (36.6 C) 98.1 F (36.7 C)  SpO2: 99% 100%    Physical Exam: Gen: lethargic, well-nourished, no acute distress  HEENT: anicteric sclera CV: RRR Chest: CTA B Abd: diffuse tenderness with guarding, soft, nondistended, +BS Ext: no edema  Lab Results: Recent Labs    10/25/21 0453 10/26/21 0834  NA 137 136  K 3.5 4.0  CL 104 103  CO2 25 28  GLUCOSE 152* 141*  BUN 7 14  CREATININE 0.84 0.65  CALCIUM 8.7* 8.8*   No results for input(s): AST, ALT, ALKPHOS, BILITOT, PROT, ALBUMIN in the last 72 hours. Recent Labs    10/25/21 0453 10/26/21 0834  WBC 5.7 5.6  HGB 11.8* 12.1  HCT 35.5* 37.7  MCV 82.8 86.1  PLT 313 315      Assessment/Plan: Proctocolitis and Infectious colitis with EIEC gastroenteritis and question of inflammatory bowel disease. Reports that symptoms have not improved since admission. Ideally need staff to document her stools and encouraged patient to let staff know when she has a BM. Will give Miralax prep today and change to clear liquid diet and NPO p MN for colonoscopy tomorrow to further evaluate for inflammatory bowel disease. Ischemic colitis also possible with recent cocaine use. Supportive care. Colonoscopy tomorrow at noon.   Jodi Mcgee 10/26/2021, 2:24 PM  Questions please call [phone] Patient ID: Jodi Mcgee, female   DOB: Jul 02, 1986, 37 y.o.   MRN: [id number]

## 2021-10-26 NOTE — H&P (View-Only) (Signed)
[facility] Gastroenterology Progress Note  Ferne SUNDAY KLOS 36 y.o. 07/29/1986   Subjective: Reports profuse jelly-like stools without bleeding (not documented in chart). Ongoing abdominal pain.  Objective: Vital signs: Vitals:   10/26/21 0159 10/26/21 0546  BP: (!) 154/104 (!) 162/116  Pulse: (!) 108 (!) 110  Resp: 18 18  Temp: 97.8 F (36.6 C) 98.1 F (36.7 C)  SpO2: 99% 100%    Physical Exam: Gen: lethargic, well-nourished, no acute distress  HEENT: anicteric sclera CV: RRR Chest: CTA B Abd: diffuse tenderness with guarding, soft, nondistended, +BS Ext: no edema  Lab Results: Recent Labs    10/25/21 0453 10/26/21 0834  NA 137 136  K 3.5 4.0  CL 104 103  CO2 25 28  GLUCOSE 152* 141*  BUN 7 14  CREATININE 0.84 0.65  CALCIUM 8.7* 8.8*   No results for input(s): AST, ALT, ALKPHOS, BILITOT, PROT, ALBUMIN in the last 72 hours. Recent Labs    10/25/21 0453 10/26/21 0834  WBC 5.7 5.6  HGB 11.8* 12.1  HCT 35.5* 37.7  MCV 82.8 86.1  PLT 313 315      Assessment/Plan: Proctocolitis and Infectious colitis with EIEC gastroenteritis and question of inflammatory bowel disease. Reports that symptoms have not improved since admission. Ideally need staff to document her stools and encouraged patient to let staff know when she has a BM. Will give Miralax prep today and change to clear liquid diet and NPO p MN for colonoscopy tomorrow to further evaluate for inflammatory bowel disease. Ischemic colitis also possible with recent cocaine use. Supportive care. Colonoscopy tomorrow at noon.   Lear Ng 10/26/2021, 2:24 PM  Questions please call [phone] Patient ID: STINA GANE, female   DOB: 31-May-1986, 36 y.o.   MRN: [id number]

## 2021-10-26 NOTE — Progress Notes (Signed)
PROGRESS NOTE   Jodi Mcgee  UVO:[id number] DOB: 1986/03/23 DOA: 10/22/2021 PCP: [facility]  Brief Narrative:  36 year old black female Reported IBS/Family history of Crohn's disease Prior E. coli with acute pyelonephritis 03/25/2019  DM TY 2 Reflux   P SBO 05/11/2020 and the setting of prior ex lap 2015 for perforated appendicitis Conservatively managed  4 visits to ED since 09/17/2021 CT abdomen pelvis 1128 bowel wall thickening repeat CTs showed resolution-Hemoccult positive GI pathogen panel came back showing EEC gastroenteritis ID consulted recommended ciprofloxacin-Dr. Maggart GI saw patient-tentative flex sig  Hospital-Problem based course  Proctocolitis heme positive stool EIEC gastroenteritis Still claims to be having jellylike watery stool Complete a total of 5 days antibiotics Gastroenterology has seen the patient and they will determine if they will scope We have discontinued IV Dilaudid and we will place on p.o. meds more frequently No adjustment of meds until daytime staff round on the patient Hypertension Poor control-started on amlodipine 5 DM TY 2 A1c 6.9 Continue sliding scale coverage-trends are 140s to 160s she is eating 100% of meals Symptoms of UTI Treated in November for E. coli although only 20,000 colony-forming units Today has bilateral flank pain Repeat urinalysis and culture-already on Cipro which should cover Polysubstance use Last used cocaine a week before admission We will discuss with her pain management on discharge specifically it would be unlikely that we will prescribe any pain meds  DVT prophylaxis: SCD Code Status: Full Family Communication: None present Disposition:  Status is: Inpatient  Remains inpatient appropriate because: Sick       Consultants:  GI  Procedures: None  Antimicrobials: Cipro since 1/1   Subjective: Awake alert tells me he has back pain no fever no chills no  nausea tolerating diet She does have diarrhea however last one was at around 6 AM  Objective: Vitals:   10/25/21 1802 10/25/21 2122 10/26/21 0159 10/26/21 0546  BP: (!) 149/106 (!) 141/104 (!) 154/104 (!) 162/116  Pulse: (!) 108 (!) 108 (!) 108 (!) 110  Resp: 18 20 18 18   Temp: 98.5 F (36.9 C) 98.2 F (36.8 C) 97.8 F (36.6 C) 98.1 F (36.7 C)  TempSrc: Oral Oral Oral Oral  SpO2: 98% 100% 99% 100%  Weight:      Height:        Intake/Output Summary (Last 24 hours) at 10/26/2021 1221 Last data filed at 10/26/2021 1104 Gross per 24 hour  Intake 3626.84 ml  Output --  Net 3626.84 ml   Filed Weights   10/22/21 0327  Weight: 59 kg    Examination:  EOMI NCAT looks fair was ambulating in room when I saw her did not seem uncomfortable CTA B no added sound no rales no rhonchi Exam out of proportion to objective findings with regards to CVA tenderness No lower extremity edema Neuro intact  Data Reviewed: personally reviewed   CBC    Component Value Date/Time   WBC 5.6 10/26/2021 0834   RBC 4.38 10/26/2021 0834   HGB 12.1 10/26/2021 0834   HGB 8.8 (L) 11/12/2017 0955   HCT 37.7 10/26/2021 0834   HCT 27.7 (L) 11/12/2017 0955   PLT 315 10/26/2021 0834   PLT 170 11/12/2017 0955   MCV 86.1 10/26/2021 0834   MCV 80 11/12/2017 0955   MCV 83 06/26/2014 0116   MCH 27.6 10/26/2021 0834   MCHC 32.1 10/26/2021 0834   RDW 13.9 10/26/2021 0834   RDW 16.3 (H) 11/12/2017 7425  RDW 14.0 06/26/2014 0116   LYMPHSABS 1.6 10/23/2021 0552   LYMPHSABS 0.9 08/23/2017 1058   LYMPHSABS 1.7 06/26/2014 0116   MONOABS 0.3 10/23/2021 0552   MONOABS 0.4 06/26/2014 0116   EOSABS 0.1 10/23/2021 0552   EOSABS 0.1 08/23/2017 1058   EOSABS 0.1 06/26/2014 0116   BASOSABS 0.0 10/23/2021 0552   BASOSABS 0.0 08/23/2017 1058   BASOSABS 0.0 06/26/2014 0116   CMP Latest Ref Rng & Units 10/26/2021 10/25/2021 10/24/2021  Glucose 70 - 99 mg/dL 141(H) 152(H) 177(H)  BUN 6 - 20 mg/dL 14 7 <5(L)  Creatinine  0.44 - 1.00 mg/dL 0.65 0.84 0.60  Sodium 135 - 145 mmol/L 136 137 136  Potassium 3.5 - 5.1 mmol/L 4.0 3.5 3.5  Chloride 98 - 111 mmol/L 103 104 105  CO2 22 - 32 mmol/L 28 25 24   Calcium 8.9 - 10.3 mg/dL 8.8(L) 8.7(L) 8.5(L)  Total Protein 6.5 - 8.1 g/dL - - -  Total Bilirubin 0.3 - 1.2 mg/dL - - -  Alkaline Phos 38 - 126 U/L - - -  AST 15 - 41 U/L - - -  ALT 0 - 44 U/L - - -     Radiology Studies: No results found.   Scheduled Meds:  amLODipine  5 mg Oral Daily   dicyclomine  20 mg Oral BID   feeding supplement (GLUCERNA SHAKE)  237 mL Oral TID BM   insulin aspart  0-5 Units Subcutaneous QHS   insulin aspart  0-9 Units Subcutaneous TID WC   nicotine  7 mg Transdermal Daily   pantoprazole (PROTONIX) IV  40 mg Intravenous Q24H   potassium chloride  10 mEq Oral Daily   Continuous Infusions:  ciprofloxacin 200 mL/hr at 10/26/21 0501   dextrose 5 % and 0.45 % NaCl with KCl 20 mEq/L 50 mL/hr at 10/26/21 0016     LOS: 4 days   Time spent: 24  Nita Sells, MD [facility] To contact the attending provider between 7A-7P or the covering provider during after hours 7P-7A, please log into the web site [email or web address] and access using universal [place] password for that web site. If you do not have the password, please call the hospital operator.  10/26/2021, 12:21 PM

## 2021-10-27 ENCOUNTER — Inpatient Hospital Stay (HOSPITAL_COMMUNITY): Payer: Medicaid Other | Admitting: Anesthesiology

## 2021-10-27 ENCOUNTER — Encounter (HOSPITAL_COMMUNITY): Payer: Self-pay | Admitting: Internal Medicine

## 2021-10-27 ENCOUNTER — Encounter (HOSPITAL_COMMUNITY)
Admission: EM | Disposition: A | Discharge status: Home / Self Care | Payer: Self-pay | Source: Home / Self Care | Attending: Internal Medicine

## 2021-10-27 DIAGNOSIS — K529 Noninfective gastroenteritis and colitis, unspecified: Secondary | ICD-10-CM | POA: Diagnosis not present

## 2021-10-27 DIAGNOSIS — R197 Diarrhea, unspecified: Secondary | ICD-10-CM | POA: Diagnosis present

## 2021-10-27 HISTORY — PX: BIOPSY: SHX5522

## 2021-10-27 HISTORY — PX: COLONOSCOPY WITH PROPOFOL: SHX5780

## 2021-10-27 LAB — GLUCOSE, CAPILLARY
Glucose-Capillary: 132 mg/dL — ABNORMAL HIGH (ref 70–99)
Glucose-Capillary: 104 mg/dL — ABNORMAL HIGH (ref 70–99)
Glucose-Capillary: 91 mg/dL (ref 70–99)
Glucose-Capillary: 157 mg/dL — ABNORMAL HIGH (ref 70–99)
Glucose-Capillary: 119 mg/dL — ABNORMAL HIGH (ref 70–99)

## 2021-10-27 LAB — CBC WITH DIFFERENTIAL/PLATELET
Abs Immature Granulocytes: 0.01 10*3/uL (ref 0.00–0.07)
Basophils Absolute: 0 10*3/uL (ref 0.0–0.1)
Basophils Relative: 0 %
Eosinophils Absolute: 0.1 10*3/uL (ref 0.0–0.5)
Eosinophils Relative: 1 %
HCT: 36.6 % (ref 36.0–46.0)
Hemoglobin: 12 g/dL (ref 12.0–15.0)
Immature Granulocytes: 0 %
Lymphocytes Relative: 27 %
Lymphs Abs: 1.4 10*3/uL (ref 0.7–4.0)
MCH: 27 pg (ref 26.0–34.0)
MCHC: 32.8 g/dL (ref 30.0–36.0)
MCV: 82.2 fL (ref 80.0–100.0)
Monocytes Absolute: 0.4 10*3/uL (ref 0.1–1.0)
Monocytes Relative: 7 %
Neutro Abs: 3.3 10*3/uL (ref 1.7–7.7)
Neutrophils Relative %: 65 %
Platelets: 282 10*3/uL (ref 150–400)
RBC: 4.45 MIL/uL (ref 3.87–5.11)
RDW: 14 % (ref 11.5–15.5)
WBC: 5.1 10*3/uL (ref 4.0–10.5)
nRBC: 0 % (ref 0.0–0.2)

## 2021-10-27 LAB — BASIC METABOLIC PANEL
Anion gap: 7 (ref 5–15)
BUN: 7 mg/dL (ref 6–20)
CO2: 26 mmol/L (ref 22–32)
Calcium: 8.6 mg/dL — ABNORMAL LOW (ref 8.9–10.3)
Chloride: 103 mmol/L (ref 98–111)
Creatinine, Ser: 0.54 mg/dL (ref 0.44–1.00)
GFR, Estimated: >60 mL/min (ref >60)
Glucose, Bld: 141 mg/dL — ABNORMAL HIGH (ref 70–99)
Potassium: 3.7 mmol/L (ref 3.5–5.1)
Sodium: 136 mmol/L (ref 135–145)

## 2021-10-27 SURGERY — COLONOSCOPY WITH PROPOFOL
Anesthesia: Monitor Anesthesia Care

## 2021-10-27 MED ORDER — MIDAZOLAM HCL 5 MG/5ML IJ SOLN
INTRAMUSCULAR | Status: DC | PRN
  Administered 2021-10-27: 2 mg via INTRAVENOUS

## 2021-10-27 MED ORDER — PROPOFOL 1000 MG/100ML IV EMUL
INTRAVENOUS | Status: AC
  Filled 2021-10-27: qty 100

## 2021-10-27 MED ORDER — ONDANSETRON HCL 4 MG/2ML IJ SOLN
INTRAMUSCULAR | Status: DC | PRN
Start: 2021-10-27 — End: 2021-10-27
  Administered 2021-10-27: 4 mg via INTRAVENOUS

## 2021-10-27 MED ORDER — MORPHINE SULFATE (PF) 2 MG/ML IV SOLN
1.0000 mg | Freq: Once | INTRAVENOUS | Status: AC
  Administered 2021-10-27: 1 mg via INTRAVENOUS
  Filled 2021-10-27: qty 1

## 2021-10-27 MED ORDER — LACTATED RINGERS IV SOLN
INTRAVENOUS | Status: AC | PRN
  Administered 2021-10-27: 10 mL/h via INTRAVENOUS

## 2021-10-27 MED ORDER — LIDOCAINE HCL (CARDIAC) PF 100 MG/5ML IV SOSY
PREFILLED_SYRINGE | INTRAVENOUS | Status: DC | PRN
  Administered 2021-10-27: 80 mg via INTRAVENOUS

## 2021-10-27 MED ORDER — PROPOFOL 500 MG/50ML IV EMUL
INTRAVENOUS | Status: DC | PRN
  Administered 2021-10-27: 40 mg via INTRAVENOUS

## 2021-10-27 MED ORDER — MIDAZOLAM HCL 2 MG/2ML IJ SOLN
INTRAMUSCULAR | Status: AC
  Filled 2021-10-27: qty 2

## 2021-10-27 MED ORDER — PROPOFOL 500 MG/50ML IV EMUL
INTRAVENOUS | Status: DC | PRN
  Administered 2021-10-27: 155 ug/kg/min via INTRAVENOUS

## 2021-10-27 SURGICAL SUPPLY — 22 items

## 2021-10-27 NOTE — Interval H&P Note (Signed)
History and Physical Interval Note:  10/27/2021 11:02 AM  Jodi Mcgee  has presented today for surgery, with the diagnosis of diarrhea.  The various methods of treatment have been discussed with the patient and family. After consideration of risks, benefits and other options for treatment, the patient has consented to  Procedure(s): COLONOSCOPY WITH PROPOFOL (N/A) as a surgical intervention.  The patient's history has been reviewed, patient examined, no change in status, stable for surgery.  I have reviewed the patient's chart and labs.  Questions were answered to the patient's satisfaction.     Lear Ng

## 2021-10-27 NOTE — Anesthesia Preprocedure Evaluation (Signed)
Anesthesia Evaluation  Patient identified by MRN, date of birth, ID band Patient awake    Reviewed: Allergy & Precautions, NPO status , Patient's Chart, lab work & pertinent test results  Airway Mallampati: II  TM Distance: >3 FB     Dental   Pulmonary Current Smoker and Patient abstained from smoking.,    breath sounds clear to auscultation       Cardiovascular hypertension,  Rhythm:Regular Rate:Normal     Neuro/Psych    GI/Hepatic Neg liver ROS, GERD  ,  Endo/Other  diabetes  Renal/GU negative Renal ROS     Musculoskeletal   Abdominal   Peds  Hematology   Anesthesia Other Findings   Reproductive/Obstetrics                             Anesthesia Physical Anesthesia Plan  ASA: 3  Anesthesia Plan: MAC   Post-op Pain Management:    Induction: Intravenous  PONV Risk Score and Plan: Propofol infusion, Ondansetron and Midazolam  Airway Management Planned: Simple Face Mask  Additional Equipment:   Intra-op Plan:   Post-operative Plan:   Informed Consent: I have reviewed the patients History and Physical, chart, labs and discussed the procedure including the risks, benefits and alternatives for the proposed anesthesia with the patient or authorized representative who has indicated his/her understanding and acceptance.     Dental advisory given  Plan Discussed with: CRNA and Anesthesiologist  Anesthesia Plan Comments:         Anesthesia Quick Evaluation

## 2021-10-27 NOTE — Brief Op Note (Signed)
No visible inflammation seen on colonoscopy. No sign of inflammatory bowel disease. Biopsies taken to check for microscopic colitis and will f/u as an outpt. Carb modified diet. Ok to go home from GI standpoint. Office will arrange f/u for 4-6 weeks.

## 2021-10-27 NOTE — Plan of Care (Signed)
?  Problem: Clinical Measurements: ?Goal: Will remain free from infection ?Outcome: Progressing ?  ?Problem: Clinical Measurements: ?Goal: Diagnostic test results will improve ?Outcome: Progressing ?  ?

## 2021-10-27 NOTE — Anesthesia Postprocedure Evaluation (Signed)
Anesthesia Post Note  Patient: Jodi Mcgee  Procedure(s) Performed: COLONOSCOPY WITH PROPOFOL BIOPSY     Patient location during evaluation: Endoscopy Anesthesia Type: MAC Level of consciousness: awake Pain management: pain level controlled Vital Signs Assessment: post-procedure vital signs reviewed and stable Cardiovascular status: stable Postop Assessment: no apparent nausea or vomiting Anesthetic complications: no   No notable events documented.  Last Vitals:  Vitals:   10/27/21 1150 10/27/21 1208  BP: (!) 141/88 (!) 154/109  Pulse: 88 93  Resp: 17 18  Temp:  36.7 C  SpO2: 98% 100%    Last Pain:  Vitals:   10/27/21 1322  TempSrc:   PainSc: 6                  Deloras Reichard

## 2021-10-27 NOTE — Progress Notes (Signed)
PROGRESS NOTE   Jodi Mcgee  IRS:[id number] DOB: 1985/12/22 DOA: 10/22/2021 PCP: [facility]  Brief Narrative:  36 year old black female Reported IBS/Family history of Crohn's disease Prior E. coli with acute pyelonephritis 03/25/2019  DM TY 2 Reflux   P SBO 05/11/2020 and the setting of prior ex lap 2015 for perforated appendicitis Conservatively managed  4 visits to ED since 09/17/2021 CT abdomen pelvis 1128 bowel wall thickening repeat CTs showed resolution-Hemoccult positive GI pathogen panel came back showing EEC gastroenteritis ID consulted recommended ciprofloxacin-Dr. Maggart GI saw patient-tentative flex sig  Hospital-Problem based course  Proctocolitis heme positive stool EIEC gastroenteritis Complete a total of 5 days antibiotics ending 1/5 Sigmoidoscopy done 1/5 as below--OP follow up Dr. Michail Sermon discontinued IV Dilaudid --now on p.o. meds more frequently No adjustment of meds until daytime staff round on the patient Hypertension Poor control-started on amlodipine 5 DM TY 2 A1c 6.9 Continue sliding scale coverage-trends are 90s to 140s she is eating 100% of meals Symptoms of UTI Treated in November for E. coli although only 20,000 colony-forming units Today has bilateral flank pain await culture-already on Cipro which should cover Polysubstance use Last used cocaine a week before admission We will discuss with her pain management on discharge specifically it would be unlikely that we will prescribe any pain meds  DVT prophylaxis: SCD Code Status: Full Family Communication: None present Disposition:  Status is: Inpatient  Remains inpatient appropriate because: Sick       Consultants:  GI  Procedures:   Impression:               - Preparation of the colon was fair.                           - Normal mucosa in the entire examined colon.                            Biopsied.                           - Internal  hemorrhoids.                           - Stool in the entire examined colon.                           - The examined portion of the ileum was normal.                            Biopsied.                           - No inflammation seen endoscopically. Biopsies                            taken to check for microscopic colitis  Antimicrobials: Cipro since 1/1   Subjective:  Pain More so that diarr No cp fever no cough  Finished entire lunch tray  Objective: Vitals:   10/27/21 1130 10/27/21 1140 10/27/21 1150 10/27/21 1208  BP: 125/83 126/86 (!) 141/88 (!) 154/109  Pulse: (!) 108 94 88 93  Resp: 12 11 17 18   Temp: Marland Kitchen)  97.5 F (36.4 C)   98 F (36.7 C)  TempSrc: Temporal   Oral  SpO2: 99% 97% 98% 100%  Weight:      Height:        Intake/Output Summary (Last 24 hours) at 10/27/2021 1412 Last data filed at 10/27/2021 0941 Gross per 24 hour  Intake 1597.72 ml  Output --  Net 1597.72 ml    Filed Weights   10/22/21 0327 10/27/21 1023  Weight: 59 kg 63.5 kg    Examination:  EOMI NCAT looks fair  CTA B no added sound no rales no rhonchi Abd soft nt nd no rebound no gaurd No lower extremity edema Neuro intact  Data Reviewed: personally reviewed   CBC    Component Value Date/Time   WBC 5.1 10/27/2021 0731   RBC 4.45 10/27/2021 0731   HGB 12.0 10/27/2021 0731   HGB 8.8 (L) 11/12/2017 0955   HCT 36.6 10/27/2021 0731   HCT 27.7 (L) 11/12/2017 0955   PLT 282 10/27/2021 0731   PLT 170 11/12/2017 0955   MCV 82.2 10/27/2021 0731   MCV 80 11/12/2017 0955   MCV 83 06/26/2014 0116   MCH 27.0 10/27/2021 0731   MCHC 32.8 10/27/2021 0731   RDW 14.0 10/27/2021 0731   RDW 16.3 (H) 11/12/2017 0955   RDW 14.0 06/26/2014 0116   LYMPHSABS 1.4 10/27/2021 0731   LYMPHSABS 0.9 08/23/2017 1058   LYMPHSABS 1.7 06/26/2014 0116   MONOABS 0.4 10/27/2021 0731   MONOABS 0.4 06/26/2014 0116   EOSABS 0.1 10/27/2021 0731   EOSABS 0.1 08/23/2017 1058   EOSABS 0.1 06/26/2014 0116    BASOSABS 0.0 10/27/2021 0731   BASOSABS 0.0 08/23/2017 1058   BASOSABS 0.0 06/26/2014 0116   CMP Latest Ref Rng & Units 10/27/2021 10/26/2021 10/25/2021  Glucose 70 - 99 mg/dL 141(H) 141(H) 152(H)  BUN 6 - 20 mg/dL 7 14 7   Creatinine 0.44 - 1.00 mg/dL 0.54 0.65 0.84  Sodium 135 - 145 mmol/L 136 136 137  Potassium 3.5 - 5.1 mmol/L 3.7 4.0 3.5  Chloride 98 - 111 mmol/L 103 103 104  CO2 22 - 32 mmol/L 26 28 25   Calcium 8.9 - 10.3 mg/dL 8.6(L) 8.8(L) 8.7(L)  Total Protein 6.5 - 8.1 g/dL - - -  Total Bilirubin 0.3 - 1.2 mg/dL - - -  Alkaline Phos 38 - 126 U/L - - -  AST 15 - 41 U/L - - -  ALT 0 - 44 U/L - - -     Radiology Studies: No results found.   Scheduled Meds:  amLODipine  5 mg Oral Daily   dicyclomine  20 mg Oral BID   feeding supplement (GLUCERNA SHAKE)  237 mL Oral TID BM   insulin aspart  0-5 Units Subcutaneous QHS   insulin aspart  0-9 Units Subcutaneous TID WC   nicotine  7 mg Transdermal Daily   pantoprazole (PROTONIX) IV  40 mg Intravenous Q24H   potassium chloride  10 mEq Oral Daily   Continuous Infusions:  sodium chloride Stopped (10/27/21 0448)   ciprofloxacin Stopped (10/27/21 0513)   dextrose 5 % and 0.45 % NaCl with KCl 20 mEq/L Stopped (10/27/21 0513)     LOS: 5 days   Time spent: [place], MD [facility] To contact the attending provider between 7A-7P or the covering provider during after hours 7P-7A, please log into the web site [email or web address] and access using universal [place] password for that web site. If you do not  have the password, please call the hospital operator.  10/27/2021, 2:12 PM

## 2021-10-27 NOTE — Op Note (Signed)
[facility] Patient Name: Jodi Mcgee Procedure Date: 10/27/2021 MRN: [id number] Attending MD: Lear Ng , MD Date of Birth: 03-02-1986 CSN: [id number] Age: 36 Admit Type: Inpatient Procedure:                Colonoscopy Indications:              Last colonoscopy: date unknown, Diarrhea Providers:                Lear Ng, MD, Carlyn Reichert, RN, Hinton Dyer Technician, Technician, Arnoldo Hooker, CRNA Referring MD:             hospital team Medicines:                Propofol per Anesthesia, Monitored Anesthesia Care Complications:            No immediate complications. Estimated Blood Loss:     Estimated blood loss was minimal. Procedure:                Pre-Anesthesia Assessment:                           - Prior to the procedure, a History and Physical                            was performed, and patient medications and                            allergies were reviewed. The patient's tolerance of                            previous anesthesia was also reviewed. The risks                            and benefits of the procedure and the sedation                            options and risks were discussed with the patient.                            All questions were answered, and informed consent                            was obtained. Prior Anticoagulants: The patient has                            taken no previous anticoagulant or antiplatelet                            agents. ASA Grade Assessment: III - A patient with                            severe systemic disease. After reviewing the risks  and benefits, the patient was deemed in                            satisfactory condition to undergo the procedure.                           After obtaining informed consent, the colonoscope                            was passed under direct vision. Throughout the                            procedure,  the patient's blood pressure, pulse, and                            oxygen saturations were monitored continuously. The                            [id number]-HQ190L ([id number] Olympus colonoscope was                            introduced through the anus and advanced to the the                            cecum, identified by appendiceal orifice and                            ileocecal valve. The colonoscopy was performed                            without difficulty. The patient tolerated the                            procedure well. The quality of the bowel                            preparation was fair. The terminal ileum, ileocecal                            valve, appendiceal orifice, and rectum were                            photographed. Scope In: 11:10:27 AM Scope Out: 11:23:03 AM Scope Withdrawal Time: 0 hours 10 minutes 1 second  Total Procedure Duration: 0 hours 12 minutes 36 seconds  Findings:      The perianal and digital rectal examinations were normal.      Normal mucosa was found in the entire colon. Biopsies for histology were       taken with a cold forceps from the entire colon for evaluation of       microscopic colitis. Estimated blood loss was minimal.      Internal hemorrhoids were found during retroflexion. The hemorrhoids       were small and Grade I (internal hemorrhoids that do not prolapse).      A moderate amount of semi-liquid  semi-solid stool was found in the       entire colon, interfering with visualization.      The terminal ileum appeared normal. Biopsies were taken with a cold       forceps for histology. Estimated blood loss was minimal. Impression:               - Preparation of the colon was fair.                           - Normal mucosa in the entire examined colon.                            Biopsied.                           - Internal hemorrhoids.                           - Stool in the entire examined colon.                           - The  examined portion of the ileum was normal.                            Biopsied.                           - No inflammation seen endoscopically. Biopsies                            taken to check for microscopic colitis. Moderate Sedation:      N/A - MAC procedure Recommendation:           - Diabetic (ADA) diet.                           - Await pathology results.                           - Repeat colonoscopy for surveillance based on                            pathology results. Procedure Code(s):        --- Professional ---                           [phone], Colonoscopy, flexible; with biopsy, single                            or multiple Diagnosis Code(s):        --- Professional ---                           R19.7, Diarrhea, unspecified                           K64.0, First degree hemorrhoids CPT copyright 2019 [employer]. All rights reserved. The codes documented in this report are  preliminary and upon coder review may  be revised to meet current compliance requirements. Lear Ng, MD 10/27/2021 11:32:37 AM This report has been signed electronically. Number of Addenda: 0

## 2021-10-27 NOTE — Transfer of Care (Signed)
Immediate Anesthesia Transfer of Care Note  Patient: Jodi Mcgee  Procedure(s) Performed: Procedure(s): COLONOSCOPY WITH PROPOFOL (N/A) BIOPSY  Patient Location: PACU  Anesthesia Type:MAC  Level of Consciousness:  sedated, patient cooperative and responds to stimulation  Airway & Oxygen Therapy:Patient Spontanous Breathing and Patient connected to face mask oxgen  Post-op Assessment:  Report given to PACU RN and Post -op Vital signs reviewed and stable  Post vital signs:  Reviewed and stable  Last Vitals:  Vitals:   10/27/21 0442 10/27/21 1023  BP: (!) 131/92 (!) 145/91  Pulse: (!) 102 93  Resp: 17 10  Temp: 36.4 C 36.8 C  SpO2: 768% 088%    Complications: No apparent anesthesia complications

## 2021-10-28 ENCOUNTER — Encounter (HOSPITAL_COMMUNITY): Payer: Self-pay | Admitting: Gastroenterology

## 2021-10-28 DIAGNOSIS — K529 Noninfective gastroenteritis and colitis, unspecified: Secondary | ICD-10-CM | POA: Diagnosis not present

## 2021-10-28 LAB — URINE CULTURE: Culture: NO GROWTH

## 2021-10-28 LAB — SURGICAL PATHOLOGY

## 2021-10-28 LAB — GLUCOSE, CAPILLARY: Glucose-Capillary: 127 mg/dL — ABNORMAL HIGH (ref 70–99)

## 2021-10-28 MED ORDER — NICOTINE 7 MG/24HR TD PT24: 7.0000 mg | MEDICATED_PATCH | Freq: Every day | TRANSDERMAL | 0 refills | Status: DC

## 2021-10-28 MED ORDER — OXYCODONE HCL 5 MG PO TABS: 5.0000 mg | ORAL_TABLET | ORAL | 0 refills | Status: DC | PRN

## 2021-10-28 MED ORDER — PANTOPRAZOLE SODIUM 40 MG PO TBEC
40.0000 mg | DELAYED_RELEASE_TABLET | Freq: Every day | ORAL | Status: DC
  Administered 2021-10-28: 40 mg via ORAL
  Filled 2021-10-28: qty 1

## 2021-10-28 MED ORDER — AMLODIPINE BESYLATE 5 MG PO TABS: 5.0000 mg | ORAL_TABLET | Freq: Every day | ORAL | 11 refills | Status: DC

## 2021-10-28 NOTE — Plan of Care (Signed)
?  Problem: Clinical Measurements: ?Goal: Will remain free from infection ?Outcome: Progressing ?  ?Problem: Clinical Measurements: ?Goal: Diagnostic test results will improve ?Outcome: Progressing ?  ?

## 2021-10-28 NOTE — Discharge Summary (Addendum)
Physician Discharge Summary  Jodi Mcgee GHW:[id number] DOB: 1986/06/09 DOA: 10/22/2021  PCP: [facility] [place] date: 10/22/2021 Discharge date: 10/28/2021  Time spent: 26 minutes  Recommendations for Outpatient Follow-up:  Needs outpatient follow-up with Dr. 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 coli with acute pyelonephritis 03/25/2019  DM TY 2 Reflux    P SBO 05/11/2020 and the setting of prior ex lap 2015 for perforated appendicitis Conservatively managed   4 visits to ED since 09/17/2021 CT abdomen pelvis 1128 bowel wall thickening repeat CTs showed resolution-Hemoccult positive GI pathogen panel came back showing EAEC gastroenteritis ID consulted recommended ciprofloxacin-Dr. Watt Climes GI saw patient-and flex sig was performed Patient slowly improved and was discharged  Hospital Course:  Proctocolitis heme positive stool EIEC gastroenteritis Completed 5 days antibiotics ending 1/5 Sigmoidoscopy done 1/5 as below--OP  follow up Dr. Michail Sermon discontinued IV Dilaudid --now on p.o. meds more frequently Patient was placed on limited amount of pain medication on discharge I have encouraged her to use her Imodium and her fentanyl Hypertension Poor control-started on amlodipine 5 this admission and will need outpatient follow-up DM TY 2 A1c 6.9 Continue sliding scale coverage-trends are 90s to 140s she is eating 100% of meals Patient discharged on her usual insulin  Symptoms of UTI Treated in November for E. coli although only 20,000 colony-forming units had flank pain culture was culture negative had completed treatment Polysubstance use Last used cocaine a week before admission Patient was given very limited amount of pain meds on discharge--will require outpatient follow-up for refills  Procedures:    Impression:               - Preparation of the colon was fair.                           - Normal mucosa in the entire examined colon.                            Biopsied.                           - Internal hemorrhoids.                           - Stool in the entire examined colon.                           -  The examined portion of the ileum was normal.                            Biopsied.                           - No inflammation seen endoscopically. Biopsies                            taken to check for microscopic colitis   Antimicrobials: Cipro since 1/1  Discharge Exam: Vitals:   10/27/21 2147 10/28/21 0504  BP: (!) 140/101 (!) 172/95  Pulse: (!) 105 (!) 107  Resp: 18 18  Temp: 98.7 F (37.1 C) 98.5 F (36.9 C)  SpO2: 100% 100%    Subj on day of d/c   Awake coherent feels fair Some abdominal pain which is usual for her No chest pain no fever  General Exam on discharge  EOMI NCAT no focal deficit chest clear no added sound no rales no rhonchi Abdomen soft no rebound no guarding S1-S2 no murmur No lower extremity edema ROM intact Psych flat  Discharge  Instructions   Discharge Instructions     Diet - low sodium heart healthy   Complete by: As directed    Discharge instructions   Complete by: As directed    Continue your usual medications including Bentyl--please consider close follow-up with gastroenterology who will tell you what your biopsies show although there was nothing acutely found in your gut when they did the scope You probably had IBS as a result of the infectious diarrheal illness and this should resolve slowly please get labs in about 1 week in the outpatient setting a regular physician office  We will only prescribe a very limited amount of pain meds-you will need follow-up for further refills   Increase activity slowly   Complete by: As directed       Allergies as of 10/28/2021       Reactions   Hydrocodone Itching   Iodinated Contrast Media Itching, Other (See Comments)   Mild itching after IV contrast on 06/01/2017No urticaria visible, wheezing, or angioedema Mild itching after IV contrast on 06/01/2017No urticaria visible, wheezing, or angioedema   Cetirizine & Related Itching, Swelling   Toradol [ketorolac Tromethamine] Hives, Itching   Flagyl [metronidazole] Itching, Swelling   Keflex [cephalexin]    Nsaids Rash        Medication List     STOP taking these medications    amoxicillin-clavulanate 875-125 MG tablet Commonly known as: AUGMENTIN   cephALEXin 500 MG capsule Commonly known as: KEFLEX   cyclobenzaprine 10 MG tablet Commonly known as: FLEXERIL   hyoscyamine 0.125 MG SL tablet Commonly known as: LEVSIN SL   insulin detemir 100 unit/ml Soln Commonly known as: LEVEMIR   naloxone 4 MG/0.1ML Liqd nasal spray kit Commonly known as: NARCAN   ondansetron 4 MG tablet Commonly known as: ZOFRAN   oseltamivir 75 MG capsule Commonly known as: TAMIFLU   oxyCODONE-acetaminophen 5-325 MG tablet Commonly known as: Percocet   polyethylene glycol 17 g packet Commonly known as: MIRALAX /  GLYCOLAX   potassium chloride 10 MEQ tablet Commonly known as: KLOR-CON   potassium chloride SA 20 MEQ tablet Commonly known as: KLOR-CON M       TAKE these medications    amLODipine 5 MG tablet Commonly known as: NORVASC  Take 1 tablet (5 mg total) by mouth daily.   blood glucose meter kit and supplies Kit Dispense based on patient and insurance preference. Use up to four times daily as directed. (FOR ICD-9 250.00, 250.01).   dicyclomine 20 MG tablet Commonly known as: BENTYL Take 1 tablet (20 mg total) by mouth 2 (two) times daily.   HumuLIN R 100 units/mL injection Generic drug: insulin regular Inject 20 Units into the skin 3 (three) times daily before meals. Per sliding scale   levonorgestrel 19.5 MCG/DAY Iud IUD Commonly known as: LILETTA 1 each by Intrauterine route continuous. Placed in 2019   loperamide 2 MG capsule Commonly known as: IMODIUM Take 1 capsule (2 mg total) by mouth 4 (four) times daily as needed for diarrhea or loose stools.   nicotine 7 mg/24hr patch Commonly known as: NICODERM CQ - dosed in mg/24 hr Place 1 patch (7 mg total) onto the skin daily.   ondansetron 4 MG disintegrating tablet Commonly known as: Zofran ODT 52m ODT q4 hours prn nausea/vomit   oxyCODONE 5 MG immediate release tablet Commonly known as: Oxy IR/ROXICODONE Take 1 tablet (5 mg total) by mouth every 4 (four) hours as needed for severe pain.   pantoprazole 20 MG tablet Commonly known as: PROTONIX Take 1 tablet (20 mg total) by mouth daily.   tizanidine 2 MG capsule Commonly known as: Zanaflex Take 1 capsule (2 mg total) by mouth 3 (three) times daily as needed for up to 30 doses for muscle spasms.       Allergies  Allergen Reactions   Hydrocodone Itching   Iodinated Contrast Media Itching and Other (See Comments)    Mild itching after IV contrast on 06/01/2017No urticaria visible, wheezing, or angioedema  Mild itching after IV contrast on 06/01/2017No urticaria  visible, wheezing, or angioedema   Cetirizine & Related Itching and Swelling   Toradol [Ketorolac Tromethamine] Hives and Itching   Flagyl [Metronidazole] Itching and Swelling   Keflex [Cephalexin]    Nsaids Rash      The results of significant diagnostics from this hospitalization (including imaging, microbiology, ancillary and laboratory) are listed below for reference.    Significant Diagnostic Studies: CT ABDOMEN PELVIS WO CONTRAST  Result Date: 10/23/2021 CLINICAL DATA:  Left lower quadrant abdominal pain EXAM: CT ABDOMEN AND PELVIS WITHOUT CONTRAST TECHNIQUE: Multidetector CT imaging of the abdomen and pelvis was performed following the standard protocol without IV contrast. COMPARISON:  10/02/2021, 09/27/2021 FINDINGS: Lower chest: Nodular infiltrate has developed within the a lingula, best seen on image # 5/4, likely infectious or inflammatory. The visualized lung bases are otherwise clear. Visualized heart and pericardium are unremarkable. Hepatobiliary: No focal liver abnormality is seen. No gallstones, gallbladder wall thickening, or biliary dilatation. Pancreas: Unremarkable Spleen: Unremarkable Adrenals/Urinary Tract: Adrenal glands are unremarkable. Kidneys are normal, without renal calculi, focal lesion, or hydronephrosis. Bladder is unremarkable. Stomach/Bowel: Since the prior examination, there has developed extensive pericolonic inflammatory stranding involving the transverse, descending, and rectosigmoid colon in keeping with an infectious or inflammatory proctocolitis. These changes appear progressive since immediate prior examination and appear more severe than on remote prior examination of 09/19/2021 where this was first identified. No evidence of obstruction. No free intraperitoneal gas or fluid. Stomach and small bowel are unremarkable. The appendix is not clearly identified and may be absent. Vascular/Lymphatic: No significant vascular findings are present. No enlarged  abdominal or pelvic lymph nodes. Reproductive: Intrauterine device in expected position within the endometrial cavity. The pelvic organs are otherwise unremarkable.  Other: No abdominal wall hernia. Musculoskeletal: No acute bone abnormality. No lytic or blastic bone lesion. IMPRESSION: Recurrent inflammatory changes in keeping with extensive proctocolitis. This appears progressive since immediate prior examination and more severe than remote prior examination of 09/19/2021. Given the history of recent antibiotic therapy, pseudomembranous colitis should be considered. Alternatively, inflammatory conditions such as ulcerative colitis could appear in this fashion. No evidence of obstruction or perforation. Minimal nodular infiltrate within the lingula, nonspecific, possibly infectious or inflammatory. Electronically Signed   By: Fidela Salisbury M.D.   On: 10/23/2021 04:04   CT ABDOMEN PELVIS WO CONTRAST  Result Date: 10/02/2021 CLINICAL DATA:  Abdominal pain, diarrhea, nausea, blood in stool EXAM: CT ABDOMEN AND PELVIS WITHOUT CONTRAST TECHNIQUE: Multidetector CT imaging of the abdomen and pelvis was performed following the standard protocol without IV contrast. COMPARISON:  09/27/2021, 09/19/2021, and 09/03/2021 FINDINGS: Lower chest: Lung bases are clear. Hepatobiliary: Unenhanced liver is unremarkable. Gallbladder is unremarkable. No intrahepatic or extrahepatic ductal dilatation. Pancreas: Within normal limits. Spleen: Within normal limits. Adrenals/Urinary Tract: 2.1 cm right adrenal adenoma, benign. Left adrenal gland is within normal limits. Kidneys are within normal limits. No renal calculi or hydronephrosis. Bladder is within normal limits. Stomach/Bowel: Stomach is within normal limits. No evidence of bowel obstruction. Appendix is not discretely visualized. No bowel wall thickening or inflammatory changes. Vascular/Lymphatic: No evidence of abdominal aortic aneurysm. No suspicious abdominopelvic  lymphadenopathy. Reproductive: Uterus is notable for an IUD in satisfactory position. No adnexal masses. Other: No abdominopelvic ascites. Musculoskeletal: Visualized osseous structures are within normal limits. IMPRESSION: No interval change from recent CT. No bowel wall thickening or inflammatory changes. Additional stable ancillary findings as above. Electronically Signed   By: Julian Hy M.D.   On: 10/02/2021 19:02    Microbiology: Recent Results (from the past 240 hour(s))  Gastrointestinal Panel by PCR , Stool     Status: Abnormal   Collection Time: 10/22/21  3:18 AM   Specimen: STOOL  Result Value Ref Range Status   Campylobacter species NOT DETECTED NOT DETECTED Final   Plesimonas shigelloides NOT DETECTED NOT DETECTED Final   Salmonella species NOT DETECTED NOT DETECTED Final   Yersinia enterocolitica NOT DETECTED NOT DETECTED Final   Vibrio species NOT DETECTED NOT DETECTED Final   Vibrio cholerae NOT DETECTED NOT DETECTED Final   Enteroaggregative E coli (EAEC) DETECTED (A) NOT DETECTED Final    Comment: CRITICAL RESULT CALLED TO, READ BACK BY AND VERIFIED WITH: CINDY MILLAR RN _0  10/23/21 SCS    Enteropathogenic E coli (EPEC) NOT DETECTED NOT DETECTED Final   Enterotoxigenic E coli (ETEC) NOT DETECTED NOT DETECTED Final   Shiga like toxin producing E coli (STEC) NOT DETECTED NOT DETECTED Final   Shigella/Enteroinvasive E coli (EIEC) NOT DETECTED NOT DETECTED Final   Cryptosporidium NOT DETECTED NOT DETECTED Final   Cyclospora cayetanensis NOT DETECTED NOT DETECTED Final   Entamoeba histolytica NOT DETECTED NOT DETECTED Final   Giardia lamblia NOT DETECTED NOT DETECTED Final   Adenovirus F40/41 NOT DETECTED NOT DETECTED Final   Astrovirus NOT DETECTED NOT DETECTED Final   Norovirus GI/GII NOT DETECTED NOT DETECTED Final   Rotavirus A NOT DETECTED NOT DETECTED Final   Sapovirus (I, II, IV, and V) NOT DETECTED NOT DETECTED Final    Comment: Performed at [facility], [address], [place], [place] [address]  C Difficile Quick Screen w PCR reflex     Status: None   Collection Time: 10/22/21  3:18 AM  Specimen: STOOL  Result Value Ref Range Status   C Diff antigen NEGATIVE NEGATIVE Final   C Diff toxin NEGATIVE NEGATIVE Final   C Diff interpretation No C. difficile detected.  Final    Comment: Performed at [facility], [place] [address] [place], [place] [address]  Resp Panel by RT-PCR (Flu A&B, Covid) Nasopharyngeal Swab     Status: None   Collection Time: 10/22/21  4:51 AM   Specimen: Nasopharyngeal Swab; Nasopharyngeal(NP) swabs in vial transport medium  Result Value Ref Range Status   SARS Coronavirus 2 by RT PCR NEGATIVE NEGATIVE Final    Comment: (NOTE) SARS-CoV-2 target nucleic acids are NOT DETECTED.  The SARS-CoV-2 RNA is generally detectable in upper respiratory specimens during the acute phase of infection. The lowest concentration of SARS-CoV-2 viral copies this assay can detect is 138 copies/mL. A negative result does not preclude SARS-Cov-2 infection and should not be used as the sole basis for treatment or other patient management decisions. A negative result may occur with  improper specimen collection/handling, submission of specimen other than nasopharyngeal swab, presence of viral mutation(s) within the areas targeted by this assay, and inadequate number of viral copies(<138 copies/mL). A negative result must be combined with clinical observations, patient history, and epidemiological information. The expected result is Negative.  Fact Sheet for Patients:  EntrepreneurPulse.com.au  Fact Sheet for Healthcare Providers:  IncredibleEmployment.be  This test is no t yet approved or cleared by the [place] FDA and  has been authorized for detection and/or diagnosis of SARS-CoV-2 by FDA under an Emergency Use Authorization (EUA). This EUA will remain  in effect (meaning  this test can be used) for the duration of the COVID-19 declaration under Section 564(b)(1) of the Act, 21 U.S.C.section 360bbb-3(b)(1), unless the authorization is terminated  or revoked sooner.       Influenza A by PCR NEGATIVE NEGATIVE Final   Influenza B by PCR NEGATIVE NEGATIVE Final    Comment: (NOTE) The Xpert Xpress SARS-CoV-2/FLU/RSV plus assay is intended as an aid in the diagnosis of influenza from Nasopharyngeal swab specimens and should not be used as a sole basis for treatment. Nasal washings and aspirates are unacceptable for Xpert Xpress SARS-CoV-2/FLU/RSV testing.  Fact Sheet for Patients: EntrepreneurPulse.com.au  Fact Sheet for Healthcare Providers: IncredibleEmployment.be  This test is not yet approved or cleared by the [place] FDA and has been authorized for detection and/or diagnosis of SARS-CoV-2 by FDA under an Emergency Use Authorization (EUA). This EUA will remain in effect (meaning this test can be used) for the duration of the COVID-19 declaration under Section 564(b)(1) of the Act, 21 U.S.C. section 360bbb-3(b)(1), unless the authorization is terminated or revoked.  Performed at [facility], [address], [place], [place] [address]   Urine Culture     Status: None   Collection Time: 10/26/21  3:56 PM   Specimen: Urine, Clean Catch  Result Value Ref Range Status   Specimen Description   Final    URINE, CLEAN CATCH Performed at [facility], [place] [address] [place], [place] [address]    Special Requests   Final    NONE Performed at [facility], [place] [address], [place], [place] [address]    Culture   Final    NO GROWTH Performed at [facility], [place] [address], [place], [place] [address]    Report Status 10/28/2021 FINAL  Final     Labs: Basic Metabolic  Panel: Recent Labs  Lab 10/22/21 2303 10/23/21 0552 10/24/21 0551 10/25/21 0453  10/26/21 0834 10/27/21 0731  NA 138 139 136 137 136 136  K 3.2* 3.2* 3.5 3.5 4.0 3.7  CL 106 108 105 104 103 103  CO2 _0 GLUCOSE 165* 73 177* 152* 141* 141*  BUN 6 <5* <5* _1 CREATININE 0.57 0.49 0.60 0.84 0.65 0.54  CALCIUM 8.3* 8.4* 8.5* 8.7* 8.8* 8.6*  MG 1.9 2.0  --   --   --   --   PHOS 3.3 3.4  --   --   --   --    Liver Function Tests: Recent Labs  Lab 10/22/21 2303 10/23/21 0552  AST 10* 10*  ALT 10 10  ALKPHOS 64 57  BILITOT 0.5 0.4  PROT 7.0 6.7  ALBUMIN 3.1* 3.0*   Recent Labs  Lab 10/22/21 0305  LIPASE 31   No results for input(s): AMMONIA in the last 168 hours. CBC: Recent Labs  Lab 10/22/21 0305 10/22/21 2303 10/23/21 0552 10/24/21 0551 10/25/21 0453 10/26/21 0834 10/27/21 0731  WBC 7.2   < > 5.0 4.1 5.7 5.6 5.1  NEUTROABS 5.4  --  3.0  --   --   --  3.3  HGB 12.5   < > 11.5* 11.0* 11.8* 12.1 12.0  HCT 38.3   < > 36.0 34.1* 35.5* 37.7 36.6  MCV 83.3   < > 86.3 85.3 82.8 86.1 82.2  PLT 426*   < > 338 275 313 315 282   < > = values in this interval not displayed.   Cardiac Enzymes: Recent Labs  Lab 10/22/21 2303  CKTOTAL 39   BNP: BNP (last 3 results) No results for input(s): BNP in the last 8760 hours.  ProBNP (last 3 results) No results for input(s): PROBNP in the last 8760 hours.  CBG: Recent Labs  Lab 10/27/21 1044 10/27/21 1219 10/27/21 1645 10/27/21 2150 10/28/21 0811  GLUCAP 104* 91 157* 119* 127*       Signed:  Nita Sells MD   [facility] 10/28/2021, 9:40 AM

## 2021-11-03 ENCOUNTER — Other Ambulatory Visit: Payer: Self-pay

## 2021-11-03 ENCOUNTER — Encounter (HOSPITAL_BASED_OUTPATIENT_CLINIC_OR_DEPARTMENT_OTHER): Payer: Self-pay | Admitting: *Deleted

## 2021-11-03 ENCOUNTER — Inpatient Hospital Stay (HOSPITAL_BASED_OUTPATIENT_CLINIC_OR_DEPARTMENT_OTHER)
Admission: EM | Admit: 2021-11-03 | Discharge: 2021-11-05 | DRG: 373 | Disposition: A | Discharge status: Home / Self Care | Payer: Medicaid Other | Attending: Internal Medicine | Admitting: Internal Medicine

## 2021-11-03 ENCOUNTER — Emergency Department (HOSPITAL_BASED_OUTPATIENT_CLINIC_OR_DEPARTMENT_OTHER): Payer: Medicaid Other

## 2021-11-03 DIAGNOSIS — E119 Type 2 diabetes mellitus without complications: Secondary | ICD-10-CM | POA: Diagnosis present

## 2021-11-03 DIAGNOSIS — F419 Anxiety disorder, unspecified: Secondary | ICD-10-CM | POA: Diagnosis present

## 2021-11-03 DIAGNOSIS — F1411 Cocaine abuse, in remission: Secondary | ICD-10-CM | POA: Diagnosis not present

## 2021-11-03 DIAGNOSIS — Z886 Allergy status to analgesic agent status: Secondary | ICD-10-CM | POA: Diagnosis not present

## 2021-11-03 DIAGNOSIS — Z20822 Contact with and (suspected) exposure to covid-19: Secondary | ICD-10-CM | POA: Diagnosis present

## 2021-11-03 DIAGNOSIS — K589 Irritable bowel syndrome without diarrhea: Secondary | ICD-10-CM | POA: Diagnosis present

## 2021-11-03 DIAGNOSIS — Z833 Family history of diabetes mellitus: Secondary | ICD-10-CM

## 2021-11-03 DIAGNOSIS — A044 Other intestinal Escherichia coli infections: Principal | ICD-10-CM | POA: Diagnosis present

## 2021-11-03 DIAGNOSIS — K219 Gastro-esophageal reflux disease without esophagitis: Secondary | ICD-10-CM | POA: Diagnosis present

## 2021-11-03 DIAGNOSIS — R197 Diarrhea, unspecified: Secondary | ICD-10-CM | POA: Diagnosis not present

## 2021-11-03 DIAGNOSIS — Z9049 Acquired absence of other specified parts of digestive tract: Secondary | ICD-10-CM

## 2021-11-03 DIAGNOSIS — Z888 Allergy status to other drugs, medicaments and biological substances status: Secondary | ICD-10-CM | POA: Diagnosis not present

## 2021-11-03 DIAGNOSIS — Z91041 Radiographic dye allergy status: Secondary | ICD-10-CM | POA: Diagnosis not present

## 2021-11-03 DIAGNOSIS — Z794 Long term (current) use of insulin: Secondary | ICD-10-CM | POA: Diagnosis not present

## 2021-11-03 DIAGNOSIS — I10 Essential (primary) hypertension: Secondary | ICD-10-CM | POA: Diagnosis present

## 2021-11-03 DIAGNOSIS — R109 Unspecified abdominal pain: Secondary | ICD-10-CM | POA: Diagnosis present

## 2021-11-03 DIAGNOSIS — Z79899 Other long term (current) drug therapy: Secondary | ICD-10-CM | POA: Diagnosis not present

## 2021-11-03 DIAGNOSIS — G8929 Other chronic pain: Secondary | ICD-10-CM | POA: Diagnosis present

## 2021-11-03 DIAGNOSIS — Z885 Allergy status to narcotic agent status: Secondary | ICD-10-CM | POA: Diagnosis not present

## 2021-11-03 DIAGNOSIS — G894 Chronic pain syndrome: Secondary | ICD-10-CM

## 2021-11-03 DIAGNOSIS — Z86711 Personal history of pulmonary embolism: Secondary | ICD-10-CM

## 2021-11-03 DIAGNOSIS — E876 Hypokalemia: Secondary | ICD-10-CM | POA: Diagnosis present

## 2021-11-03 DIAGNOSIS — F17210 Nicotine dependence, cigarettes, uncomplicated: Secondary | ICD-10-CM | POA: Diagnosis present

## 2021-11-03 LAB — COMPREHENSIVE METABOLIC PANEL
ALT: 11 U/L (ref 0–44)
AST: 11 U/L — ABNORMAL LOW (ref 15–41)
Albumin: 4.5 g/dL (ref 3.5–5.0)
Alkaline Phosphatase: 65 U/L (ref 38–126)
Anion gap: 10 (ref 5–15)
BUN: 8 mg/dL (ref 6–20)
CO2: 29 mmol/L (ref 22–32)
Calcium: 9.9 mg/dL (ref 8.9–10.3)
Chloride: 99 mmol/L (ref 98–111)
Creatinine, Ser: 0.65 mg/dL (ref 0.44–1.00)
GFR, Estimated: >60 mL/min (ref >60)
Glucose, Bld: 131 mg/dL — ABNORMAL HIGH (ref 70–99)
Potassium: 3 mmol/L — ABNORMAL LOW (ref 3.5–5.1)
Sodium: 138 mmol/L (ref 135–145)
Total Bilirubin: 1 mg/dL (ref 0.3–1.2)
Total Protein: 8.5 g/dL — ABNORMAL HIGH (ref 6.5–8.1)

## 2021-11-03 LAB — RAPID URINE DRUG SCREEN, HOSP PERFORMED
Amphetamines: NOT DETECTED
Barbiturates: NOT DETECTED
Benzodiazepines: NOT DETECTED
Cocaine: POSITIVE — AB
Opiates: POSITIVE — AB
Tetrahydrocannabinol: NOT DETECTED

## 2021-11-03 LAB — CBC WITH DIFFERENTIAL/PLATELET
Abs Immature Granulocytes: 0.01 10*3/uL (ref 0.00–0.07)
Basophils Absolute: 0 10*3/uL (ref 0.0–0.1)
Basophils Relative: 1 %
Eosinophils Absolute: 0.1 10*3/uL (ref 0.0–0.5)
Eosinophils Relative: 1 %
HCT: 41.4 % (ref 36.0–46.0)
Hemoglobin: 13.3 g/dL (ref 12.0–15.0)
Immature Granulocytes: 0 %
Lymphocytes Relative: 19 %
Lymphs Abs: 1.5 10*3/uL (ref 0.7–4.0)
MCH: 27.1 pg (ref 26.0–34.0)
MCHC: 32.1 g/dL (ref 30.0–36.0)
MCV: 84.3 fL (ref 80.0–100.0)
Monocytes Absolute: 0.3 10*3/uL (ref 0.1–1.0)
Monocytes Relative: 4 %
Neutro Abs: 5.7 10*3/uL (ref 1.7–7.7)
Neutrophils Relative %: 75 %
Platelets: 352 10*3/uL (ref 150–400)
RBC: 4.91 MIL/uL (ref 3.87–5.11)
RDW: 14.6 % (ref 11.5–15.5)
WBC: 7.7 10*3/uL (ref 4.0–10.5)
nRBC: 0 % (ref 0.0–0.2)

## 2021-11-03 LAB — URINALYSIS, ROUTINE W REFLEX MICROSCOPIC
Bilirubin Urine: NEGATIVE
Glucose, UA: NEGATIVE mg/dL
Hgb urine dipstick: NEGATIVE
Ketones, ur: NEGATIVE mg/dL
Leukocytes,Ua: NEGATIVE
Nitrite: NEGATIVE
Protein, ur: NEGATIVE mg/dL
Specific Gravity, Urine: 1.011 (ref 1.005–1.030)
pH: 6 (ref 5.0–8.0)

## 2021-11-03 LAB — RESP PANEL BY RT-PCR (FLU A&B, COVID) ARPGX2
Influenza A by PCR: NEGATIVE
Influenza B by PCR: NEGATIVE
SARS Coronavirus 2 by RT PCR: NEGATIVE

## 2021-11-03 LAB — MAGNESIUM: Magnesium: 2 mg/dL (ref 1.7–2.4)

## 2021-11-03 LAB — GLUCOSE, CAPILLARY: Glucose-Capillary: 117 mg/dL — ABNORMAL HIGH (ref 70–99)

## 2021-11-03 LAB — PREGNANCY, URINE: Preg Test, Ur: NEGATIVE

## 2021-11-03 MED ORDER — DIPHENHYDRAMINE HCL 25 MG PO CAPS
25.0000 mg | ORAL_CAPSULE | ORAL | Status: DC | PRN
  Administered 2021-11-04: 25 mg via ORAL
  Filled 2021-11-03 (×2): qty 1

## 2021-11-03 MED ORDER — MORPHINE SULFATE (PF) 4 MG/ML IV SOLN
4.0000 mg | Freq: Once | INTRAVENOUS | Status: AC
  Administered 2021-11-03: 4 mg via INTRAVENOUS
  Filled 2021-11-03: qty 1

## 2021-11-03 MED ORDER — ACETAMINOPHEN 325 MG PO TABS
650.0000 mg | ORAL_TABLET | Freq: Four times a day (QID) | ORAL | Status: DC | PRN
  Filled 2021-11-03: qty 2

## 2021-11-03 MED ORDER — DIPHENHYDRAMINE HCL 50 MG/ML IJ SOLN
25.0000 mg | Freq: Once | INTRAMUSCULAR | Status: AC
  Administered 2021-11-03: 25 mg via INTRAVENOUS
  Filled 2021-11-03: qty 1

## 2021-11-03 MED ORDER — FENTANYL CITRATE PF 50 MCG/ML IJ SOSY
75.0000 ug | PREFILLED_SYRINGE | Freq: Once | INTRAMUSCULAR | Status: AC
  Administered 2021-11-03: 75 ug via INTRAVENOUS
  Filled 2021-11-03: qty 2

## 2021-11-03 MED ORDER — DICYCLOMINE HCL 20 MG PO TABS
20.0000 mg | ORAL_TABLET | Freq: Two times a day (BID) | ORAL | Status: DC
  Administered 2021-11-03 – 2021-11-05 (×3): 20 mg via ORAL
  Filled 2021-11-03 (×5): qty 1

## 2021-11-03 MED ORDER — INSULIN ASPART 100 UNIT/ML IJ SOLN
0.0000 [IU] | Freq: Three times a day (TID) | INTRAMUSCULAR | Status: DC
  Administered 2021-11-04 (×2): 1 [IU] via SUBCUTANEOUS

## 2021-11-03 MED ORDER — HYDROMORPHONE HCL 1 MG/ML IJ SOLN
1.0000 mg | Freq: Once | INTRAMUSCULAR | Status: AC
  Administered 2021-11-03: 1 mg via INTRAVENOUS
  Filled 2021-11-03: qty 1

## 2021-11-03 MED ORDER — HYDROCODONE-ACETAMINOPHEN 5-325 MG PO TABS
1.0000 | ORAL_TABLET | Freq: Once | ORAL | Status: DC
  Filled 2021-11-03: qty 1

## 2021-11-03 MED ORDER — ACETAMINOPHEN 650 MG RE SUPP: 650.0000 mg | Freq: Four times a day (QID) | RECTAL | Status: DC | PRN

## 2021-11-03 MED ORDER — POLYETHYLENE GLYCOL 3350 17 G PO PACK: 17.0000 g | PACK | Freq: Every day | ORAL | Status: DC | PRN

## 2021-11-03 MED ORDER — AMLODIPINE BESYLATE 5 MG PO TABS
5.0000 mg | ORAL_TABLET | Freq: Every day | ORAL | Status: DC
  Administered 2021-11-04 – 2021-11-05 (×2): 5 mg via ORAL
  Filled 2021-11-03 (×2): qty 1

## 2021-11-03 MED ORDER — ENOXAPARIN SODIUM 40 MG/0.4ML IJ SOSY
40.0000 mg | PREFILLED_SYRINGE | INTRAMUSCULAR | Status: DC
  Administered 2021-11-03: 40 mg via SUBCUTANEOUS
  Filled 2021-11-03 (×2): qty 0.4

## 2021-11-03 MED ORDER — DIPHENHYDRAMINE HCL 25 MG PO CAPS
25.0000 mg | ORAL_CAPSULE | Freq: Once | ORAL | Status: AC
  Administered 2021-11-03: 25 mg via ORAL
  Filled 2021-11-03: qty 1

## 2021-11-03 MED ORDER — NICOTINE 7 MG/24HR TD PT24
7.0000 mg | MEDICATED_PATCH | Freq: Every day | TRANSDERMAL | Status: DC
  Administered 2021-11-03 – 2021-11-04 (×2): 7 mg via TRANSDERMAL
  Filled 2021-11-03 (×2): qty 1

## 2021-11-03 MED ORDER — OXYCODONE HCL 5 MG PO TABS
5.0000 mg | ORAL_TABLET | ORAL | Status: DC | PRN
  Administered 2021-11-03 – 2021-11-05 (×7): 5 mg via ORAL
  Filled 2021-11-03 (×8): qty 1

## 2021-11-03 MED ORDER — SODIUM CHLORIDE 0.9% FLUSH
3.0000 mL | Freq: Two times a day (BID) | INTRAVENOUS | Status: DC
  Administered 2021-11-03 – 2021-11-04 (×3): 3 mL via INTRAVENOUS

## 2021-11-03 MED ORDER — POTASSIUM CHLORIDE 20 MEQ PO PACK
40.0000 meq | PACK | Freq: Two times a day (BID) | ORAL | Status: DC
  Administered 2021-11-03: 40 meq via ORAL
  Filled 2021-11-03 (×2): qty 2

## 2021-11-03 MED ORDER — ONDANSETRON HCL 4 MG/2ML IJ SOLN
4.0000 mg | Freq: Once | INTRAMUSCULAR | Status: AC
  Administered 2021-11-03: 4 mg via INTRAVENOUS
  Filled 2021-11-03: qty 2

## 2021-11-03 MED ORDER — SODIUM CHLORIDE 0.9 % IV BOLUS
1000.0000 mL | Freq: Once | INTRAVENOUS | Status: AC
  Administered 2021-11-03: 1000 mL via INTRAVENOUS

## 2021-11-03 MED ORDER — INSULIN ASPART 100 UNIT/ML IJ SOLN: 0.0000 [IU] | Freq: Every day | INTRAMUSCULAR | Status: DC

## 2021-11-03 MED ORDER — LOPERAMIDE HCL 2 MG PO CAPS: 2.0000 mg | ORAL_CAPSULE | Freq: Four times a day (QID) | ORAL | Status: DC | PRN

## 2021-11-03 MED ORDER — HYDROMORPHONE HCL 1 MG/ML IJ SOLN
0.5000 mg | INTRAMUSCULAR | Status: AC | PRN
  Administered 2021-11-03 – 2021-11-04 (×3): 1 mg via INTRAVENOUS
  Filled 2021-11-03 (×4): qty 1

## 2021-11-03 MED ORDER — PANTOPRAZOLE SODIUM 20 MG PO TBEC
20.0000 mg | DELAYED_RELEASE_TABLET | Freq: Every day | ORAL | Status: DC
  Administered 2021-11-05: 20 mg via ORAL
  Filled 2021-11-03 (×2): qty 1

## 2021-11-03 MED ORDER — DIPHENHYDRAMINE HCL 50 MG/ML IJ SOLN
12.5000 mg | Freq: Once | INTRAMUSCULAR | Status: AC
  Administered 2021-11-03: 12.5 mg via INTRAVENOUS
  Filled 2021-11-03: qty 1

## 2021-11-03 NOTE — ED Notes (Signed)
Pt declines in/out cath or bladder scan to obtain UA. Beverage provided per patient request. Specimen cup at bedside

## 2021-11-03 NOTE — ED Notes (Signed)
Paged Dr. Clayton Bibles to Dr. Stark Jock

## 2021-11-03 NOTE — ED Notes (Signed)
Unable to provide a urine sample at this time.

## 2021-11-03 NOTE — ED Notes (Signed)
Given supplies for perineal care in restroom, pt is independent with this. Ambulates independently. Aware of need for urine sample, with attempt at this time

## 2021-11-03 NOTE — ED Notes (Signed)
[facility] at bedside 

## 2021-11-03 NOTE — H&P (Addendum)
History and Physical   Jodi Mcgee ZOX:[id number] DOB: 19-Nov-1985 DOA: 11/03/2021  PCP: [facility]   Patient coming from: Home  Chief Complaint: Abdominal pain, Diarrhea  HPI: Jodi Mcgee is a 36 y.o. female with medical history significant of chronic pain, diabetes, pulmonary embolism, SBO, pelvic adhesions, cocaine use, GERD, anxiety, IBS presenting with ongoing abdominal pain.  Patient has had 2 days of abdominal pain states this feels different than her previous abdominal pain.  She was recently admitted from 12/31 until 1/6 due to infectious colitis secondary to E. coli.  Seen by GI endoscopy done at that time.  She was treated with a full course of ciprofloxacin.  As below CT shows inflammatory changes of the colon have resolved.  He states that she initially got better after her course of antibiotics for her colitis.  She states that she did not developed abdominal pain and significant diarrhea.  She states that her diarrhea is watery and has been occurring around 10 times a day.  She states she seems to have a bowel movement with every time she eats as well. She states she saw GI the day prior to presentation and they are getting stool cultures.  She denies fevers, chills, chest pain, shortness of breath,  constipation, nausea, vomiting, bloody stool   ED Course: Vital signs in the ED significant for heart rate in the 90s to 100s and blood pressure in the 811B to 147W systolic.  CMP with potassium of 3, glucose 131, protein 8.5.  CBC within normal limits.  Rest were panel flu COVID-negative.  UDS showing opiates and cocaine.  Urinalysis with out evidence of infection.  CT of the abdomen pelvis showed no acute abnormality to explain her symptoms, previous inflammatory changes of the colon resolved, stable changes otherwise.  Patient received multiple course of pain medication and Benadryl in the ED with persistent pain despite this.  Review of  Systems: As per HPI otherwise all other systems reviewed and are negative.  Past Medical History:  Diagnosis Date   Anemia    Anxiety    Blood transfusion without reported diagnosis    both CS   Cocaine abuse ([place])    Diabetes mellitus without complication (HCC)    GERD (gastroesophageal reflux disease)    has resolved   IBS (irritable bowel syndrome)    Ovarian cyst    Peritonitis, acute generalized ([place])    Pulmonary embolism (HCC)    SBO (small bowel obstruction) ([place])    Sepsis ([place])     Past Surgical History:  Procedure Laterality Date   APPENDECTOMY     ruptured    BIOPSY  10/27/2021   Procedure: BIOPSY;  Surgeon: Wilford Corner, MD;  Location: [facility];  Service: Endoscopy;;   BUNIONECTOMY     CESAREAN SECTION     CESAREAN SECTION Bilateral 11/20/2017   Procedure: CESAREAN SECTION;  Surgeon: Sloan Leiter, MD;  Location: [facility];  Service: Obstetrics;  Laterality: Bilateral;   COLONOSCOPY WITH PROPOFOL N/A 10/27/2021   Procedure: COLONOSCOPY WITH PROPOFOL;  Surgeon: Wilford Corner, MD;  Location: [facility];  Service: Endoscopy;  Laterality: N/A;   OVARIAN CYST DRAINAGE     SMALL BOWEL REPAIR      Social History  reports that she has been smoking cigarettes. She has been smoking an average of .15 packs per day. She has never used smokeless tobacco. She reports that she does not currently use alcohol. She reports that she  does not currently use drugs.  Allergies  Allergen Reactions   Hydrocodone Itching   Iodinated Contrast Media Itching and Other (See Comments)    Mild itching after IV contrast on 06/01/2017No urticaria visible, wheezing, or angioedema  Mild itching after IV contrast on 06/01/2017No urticaria visible, wheezing, or angioedema   Cetirizine & Related Itching and Swelling   Toradol [Ketorolac Tromethamine] Hives and Itching   Flagyl [Metronidazole] Itching and Swelling   Keflex [Cephalexin]    Nsaids Rash    Family History   Problem Relation Age of Onset   Hypertension Mother    Anemia Mother    Diabetes Father   Reviewed on admission  Prior to Admission medications   Medication Sig Start Date End Date Taking? Authorizing Provider  amLODipine (NORVASC) 5 MG tablet Take 1 tablet (5 mg total) by mouth daily. 10/28/21   Nita Sells, MD  blood glucose meter kit and supplies KIT Dispense based on patient and insurance preference. Use up to four times daily as directed. (FOR ICD-9 250.00, 250.01). 02/21/19   Oretha Milch D, MD  dicyclomine (BENTYL) 20 MG tablet Take 1 tablet (20 mg total) by mouth 2 (two) times daily. 10/02/21   Azucena Cecil, PA-C  HUMULIN R 100 UNIT/ML injection Inject 20 Units into the skin 3 (three) times daily before meals. Per sliding scale 03/24/20   [provider]  Levonorgestrel (LILETTA) 19.5 MCG/DAY IUD IUD 1 each by Intrauterine route continuous. Placed in 2019    [provider]  loperamide (IMODIUM) 2 MG capsule Take 1 capsule (2 mg total) by mouth 4 (four) times daily as needed for diarrhea or loose stools. 04/08/21   Tedd Sias, PA  nicotine (NICODERM CQ - DOSED IN MG/24 HR) 7 mg/24hr patch Place 1 patch (7 mg total) onto the skin daily. 10/28/21   Nita Sells, MD  ondansetron (ZOFRAN ODT) 4 MG disintegrating tablet 10m ODT q4 hours prn nausea/vomit Patient not taking: Reported on 10/23/2021 09/04/21   Mesner, JCorene Cornea MD  oxyCODONE (OXY IR/ROXICODONE) 5 MG immediate release tablet Take 1 tablet (5 mg total) by mouth every 4 (four) hours as needed for severe pain. 10/28/21   SNita Sells MD  pantoprazole (PROTONIX) 20 MG tablet Take 1 tablet (20 mg total) by mouth daily. Patient not taking: Reported on 10/23/2021 10/02/21   GAzucena Cecil PA-C  tizanidine (ZANAFLEX) 2 MG capsule Take 1 capsule (2 mg total) by mouth 3 (three) times daily as needed for up to 30 doses for muscle spasms. 08/13/21   CLennice Sites DO    Physical  Exam: Vitals:   11/03/21 0900 11/03/21 1247 11/03/21 1555 11/03/21 1751  BP: 127/87 (!) 144/128 (!) 143/98 124/88  Pulse: (!) 108 90 (!) 104 84  Resp: '17 16 16 17  ' Temp:  98.3 F (36.8 C)  98.1 F (36.7 C)  TempSrc:  Oral  Oral  SpO2: 100% 100% 100% 100%  Weight:      Height:       Physical Exam Constitutional:      General: She is not in acute distress.    Appearance: Normal appearance.  HENT:     Head: Normocephalic and atraumatic.     Mouth/Throat:     Mouth: Mucous membranes are moist.     Pharynx: Oropharynx is clear.  Eyes:     Extraocular Movements: Extraocular movements intact.     Pupils: Pupils are equal, round, and reactive to light.  Cardiovascular:  Rate and Rhythm: Normal rate and regular rhythm.     Pulses: Normal pulses.     Heart sounds: Normal heart sounds.  Pulmonary:     Effort: Pulmonary effort is normal. No respiratory distress.     Breath sounds: Normal breath sounds.  Abdominal:     General: Bowel sounds are normal. There is no distension.     Palpations: Abdomen is soft.     Tenderness: There is no abdominal tenderness (mild).  Musculoskeletal:        General: No swelling or deformity.  Skin:    General: Skin is warm and dry.  Neurological:     General: No focal deficit present.     Mental Status: Mental status is at baseline.   Labs on Admission: I have personally reviewed following labs and imaging studies  CBC: Recent Labs  Lab 11/03/21 0200  WBC 7.7  NEUTROABS 5.7  HGB 13.3  HCT 41.4  MCV 84.3  PLT 449    Basic Metabolic Panel: Recent Labs  Lab 11/03/21 0200  NA 138  K 3.0*  CL 99  CO2 29  GLUCOSE 131*  BUN 8  CREATININE 0.65  CALCIUM 9.9    GFR: Estimated Creatinine Clearance: 94.5 mL/min (by C-G formula based on SCr of 0.65 mg/dL).  Liver Function Tests: Recent Labs  Lab 11/03/21 0200  AST 11*  ALT 11  ALKPHOS 65  BILITOT 1.0  PROT 8.5*  ALBUMIN 4.5    Urine analysis:    Component Value  Date/Time   COLORURINE YELLOW 11/03/2021 1445   APPEARANCEUR CLEAR 11/03/2021 1445   APPEARANCEUR Clear 06/26/2014 0116   LABSPEC 1.011 11/03/2021 1445   LABSPEC 1.031 06/26/2014 0116   PHURINE 6.0 11/03/2021 1445   GLUCOSEU NEGATIVE 11/03/2021 1445   GLUCOSEU Negative 06/26/2014 0116   HGBUR NEGATIVE 11/03/2021 1445   BILIRUBINUR NEGATIVE 11/03/2021 1445   BILIRUBINUR Negative 06/26/2014 0116   KETONESUR NEGATIVE 11/03/2021 1445   PROTEINUR NEGATIVE 11/03/2021 1445   UROBILINOGEN 0.2 05/17/2019 1326   NITRITE NEGATIVE 11/03/2021 1445   LEUKOCYTESUR NEGATIVE 11/03/2021 1445   LEUKOCYTESUR Trace 06/26/2014 0116    Radiological Exams on Admission: CT ABDOMEN PELVIS WO CONTRAST  Result Date: 11/03/2021 CLINICAL DATA:  36 year old female with history of acute onset of nonlocalized abdominal pain radiating to the back. Uncontrolled diarrhea. EXAM: CT ABDOMEN AND PELVIS WITHOUT CONTRAST TECHNIQUE: Multidetector CT imaging of the abdomen and pelvis was performed following the standard protocol without IV contrast. RADIATION DOSE REDUCTION: This exam was performed according to the departmental dose-optimization program which includes automated exposure control, adjustment of the mA and/or kV according to patient size and/or use of iterative reconstruction technique. COMPARISON:  CT the abdomen and pelvis 10/23/2021. FINDINGS: Lower chest: Unremarkable. Hepatobiliary: No suspicious cystic or solid hepatic lesions are confidently identified on today's noncontrast CT examination. Unenhanced appearance of the gallbladder is normal. Pancreas: No definite pancreatic mass or peripancreatic fluid collections or inflammatory changes are confidently identified on today's noncontrast CT examination. Spleen: Unremarkable. Adrenals/Urinary Tract: No calcifications are identified within the collecting system of either kidney, along the course of either ureter, or within the lumen of the urinary bladder. No  hydroureteronephrosis. Bilateral kidneys and the left adrenal gland are normal in appearance. 2.2 x 1.2 cm intermediate attenuation (33 HU) right adrenal nodule, similar to recent prior studies, incompletely characterized, but potentially a lipid poor adenoma. Unenhanced appearance of the urinary bladder is unremarkable. Stomach/Bowel: Unenhanced appearance of the stomach is normal. No pathologic dilatation  of small bowel or colon. Previously noted pericolonic inflammatory changes have resolved. The appendix is not confidently identified and may be surgically absent. Regardless, there are no inflammatory changes noted adjacent to the cecum to suggest the presence of an acute appendicitis at this time. Vascular/Lymphatic: No atherosclerotic calcifications are noted in the abdominal aorta or pelvic vasculature. No lymphadenopathy identified in the abdomen or pelvis. Reproductive: IUD present in the uterus. Ovaries are unremarkable in appearance. Other: No significant volume of ascites.  No pneumoperitoneum. Musculoskeletal: There are no aggressive appearing lytic or blastic lesions noted in the visualized portions of the skeleton. IMPRESSION: 1. No acute abnormality in the abdomen or pelvis to account for the patient's symptoms. Previously noted pericolonic inflammatory changes have resolved. 2. Incidental findings, similar to prior studies, as above Electronically Signed   By: Vinnie Langton M.D.   On: 11/03/2021 05:13    EKG: Not performed in the ED.  Assessment/Plan Principal Problem:   Intractable abdominal pain Active Problems:   Type 2 diabetes mellitus, without long-term current use of insulin (HCC)   Chronic pain syndrome   Chronic hypertension   History of cocaine abuse (HCC)   Hypokalemia   Diarrhea  Nausea Intractable abdominal pain > Patient with a history of chronic pain and recent infectious colitis presenting with severe abdominal pain.  Pain unable to be improved with pain medication  in the ED and patient excepted for observation. > On my exam her pain has improved but she is more concerned about her diarrhea. > No evidence of colitis currently with CT scan showed no acute normality and previous inflammatory changes have improved.  No fever, no leukocytosis.  No bloody stool. > Does have history of IBS, pelvic adhesive disease, cocaine use.  These entities could be playing a role. > Given that her diarrhea initially improved and then worsened again after her treatment with ciprofloxacin for her colitis.  We will check C. difficile panel. - We will give couple doses of as needed Dilaudid with Benadryl available for itching - We will need to transfer to p.o. pain control - Continuous pulse ox - Soft diet - Check C. difficile - Monitor overnight  Hypokalemia > Potassium noted to be 3.0 in the ED - 40 mg p.o. potassium x2  - Check magnesium   Diabetes - SSI  Hypertension - Continue home amlodipine   History of cocaine use > UDS positive on admission.  Unclear how this could be contributing to her abdominal pain. - Encouraged cessation   IBS - Continue home Bentyl and as needed loperamide  DVT prophylaxis: Lovenox  Code Status:   Full  Family Communication:  None on admission  Disposition Plan:   Patient is from:  Home  Anticipated DC to:  Home  Anticipated DC date:  1 to 2 days  Anticipated DC barriers: None  Consults called:  None  Admission status:  Observation, MedSurg with continuous pulse ox  Severity of Illness: The appropriate patient status for this patient is OBSERVATION. Observation status is judged to be reasonable and necessary in order to provide the required intensity of service to ensure the patient's safety. The patient's presenting symptoms, physical exam findings, and initial radiographic and laboratory data in the context of their medical condition is felt to place them at decreased risk for further clinical deterioration. Furthermore, it  is anticipated that the patient will be medically stable for discharge from the hospital within 2 midnights of admission.    Marcelyn Bruins MD  [facility]  How to contact the [facility] Attending or Consulting provider [place] or covering provider during after hours [place], for this patient?   Check the care team in [facility] and look for a) attending/consulting [facility] provider listed and b) the [facility] team listed Log into [email or web address] and use [place]'s universal password to access. If you do not have the password, please contact the hospital operator. Locate the [facility] provider you are looking for under [facility] and page to a number that you can be directly reached. If you still have difficulty reaching the provider, please page the [facility] (Director on Call) for the Hospitalists listed on amion for assistance.  11/03/2021, 8:05 PM

## 2021-11-03 NOTE — ED Triage Notes (Signed)
Pt c/o abdominal pain that radiates around to her back and left side; pt states she has uncontrollable diarrhea

## 2021-11-03 NOTE — ED Notes (Addendum)
Handoff report given to [facility] and to Charity fundraiser at [employer] at [employer]

## 2021-11-03 NOTE — ED Provider Notes (Signed)
[facility] EMERGENCY DEPT Provider Note   CSN: [id number] Arrival date & time: 11/03/21  0146     History  Chief Complaint  Patient presents with   Abdominal Pain    Jodi Mcgee is a 36 y.o. female.  Morphine patient is a 36 year old female with past medical history of chronic abdominal pain and recent admission for enteroinvasive E. coli.  Patient presenting today with complaints of diarrhea and left-sided abdominal pain.  This started yesterday and is worsening.  Jodi Mcgee was at the GI office yesterday and tells me stools cultures were collected and are pending.  Jodi Mcgee denies fevers or chills.  Jodi Mcgee denies bloody stools.  The pain Jodi Mcgee feels is different from what Jodi Mcgee is experienced in the past.  Patient with multiple ER visits recently and has a history of multiple imaging studies  The history is provided by the patient.  Abdominal Pain Pain location:  L flank and LLQ Pain quality: stabbing   Pain radiates to:  Does not radiate Pain severity:  Severe Onset quality:  Sudden Duration:  2 days Timing:  Constant Progression:  Worsening Chronicity:  Recurrent Relieved by:  Nothing Worsened by:  Movement and palpation Ineffective treatments:  None tried     Home Medications Prior to Admission medications   Medication Sig Start Date End Date Taking? Authorizing Provider  amLODipine (NORVASC) 5 MG tablet Take 1 tablet (5 mg total) by mouth daily. 10/28/21   Nita Sells, MD  blood glucose meter kit and supplies KIT Dispense based on patient and insurance preference. Use up to four times daily as directed. (FOR ICD-9 250.00, 250.01). 02/21/19   Oretha Milch D, MD  dicyclomine (BENTYL) 20 MG tablet Take 1 tablet (20 mg total) by mouth 2 (two) times daily. 10/02/21   Azucena Cecil, PA-C  HUMULIN R 100 UNIT/ML injection Inject 20 Units into the skin 3 (three) times daily before meals. Per sliding scale 03/24/20   [provider]  Levonorgestrel  (LILETTA) 19.5 MCG/DAY IUD IUD 1 each by Intrauterine route continuous. Placed in 2019    [provider]  loperamide (IMODIUM) 2 MG capsule Take 1 capsule (2 mg total) by mouth 4 (four) times daily as needed for diarrhea or loose stools. 04/08/21   Tedd Sias, PA  nicotine (NICODERM CQ - DOSED IN MG/24 HR) 7 mg/24hr patch Place 1 patch (7 mg total) onto the skin daily. 10/28/21   Nita Sells, MD  ondansetron (ZOFRAN ODT) 4 MG disintegrating tablet 79m ODT q4 hours prn nausea/vomit Patient not taking: Reported on 10/23/2021 09/04/21   Mesner, JCorene Cornea MD  oxyCODONE (OXY IR/ROXICODONE) 5 MG immediate release tablet Take 1 tablet (5 mg total) by mouth every 4 (four) hours as needed for severe pain. 10/28/21   SNita Sells MD  pantoprazole (PROTONIX) 20 MG tablet Take 1 tablet (20 mg total) by mouth daily. Patient not taking: Reported on 10/23/2021 10/02/21   GAzucena Cecil PA-C  tizanidine (ZANAFLEX) 2 MG capsule Take 1 capsule (2 mg total) by mouth 3 (three) times daily as needed for up to 30 doses for muscle spasms. 08/13/21   Curatolo, Adam, DO      Allergies    Hydrocodone, Iodinated contrast media, Cetirizine & related, Toradol [ketorolac tromethamine], Flagyl [metronidazole], Keflex [cephalexin], and Nsaids    Review of Systems   Review of Systems  Gastrointestinal:  Positive for abdominal pain.  All other systems reviewed and are negative.  Physical Exam Updated Vital Signs BP (Marland Kitchen  129/97 (BP Location: Right Arm)    Pulse (!) 110    Temp 98.3 F (36.8 C) (Oral)    Resp 18    Ht '5\' 7"'  (1.702 m)    Wt 63.5 kg    SpO2 100%    BMI 21.93 kg/m  Physical Exam Vitals and nursing note reviewed.  Constitutional:      General: Jodi Mcgee is not in acute distress.    Appearance: Jodi Mcgee is well-developed. Jodi Mcgee is not diaphoretic.  HENT:     Head: Normocephalic and atraumatic.  Cardiovascular:     Rate and Rhythm: Normal rate and regular rhythm.     Heart sounds: No murmur  heard.   No friction rub. No gallop.  Pulmonary:     Effort: Pulmonary effort is normal. No respiratory distress.     Breath sounds: Normal breath sounds. No wheezing.  Abdominal:     General: Bowel sounds are normal. There is no distension.     Palpations: Abdomen is soft.     Tenderness: There is abdominal tenderness in the left lower quadrant. There is left CVA tenderness. There is no right CVA tenderness, guarding or rebound.  Musculoskeletal:        General: Normal range of motion.     Cervical back: Normal range of motion and neck supple.  Skin:    General: Skin is warm and dry.  Neurological:     General: No focal deficit present.     Mental Status: Jodi Mcgee is alert and oriented to person, place, and time.    ED Results / Procedures / Treatments   Labs (all labs ordered are listed, but only abnormal results are displayed) Labs Reviewed  COMPREHENSIVE METABOLIC PANEL  CBC WITH DIFFERENTIAL/PLATELET  URINALYSIS, ROUTINE W REFLEX MICROSCOPIC  PREGNANCY, URINE    EKG None  Radiology No results found.  Procedures Procedures    Medications Ordered in ED Medications  morphine 4 MG/ML injection 4 mg (has no administration in time range)  sodium chloride 0.9 % bolus 1,000 mL (has no administration in time range)  ondansetron (ZOFRAN) injection 4 mg (has no administration in time range)    ED Course/ Medical Decision Making/ A&P  This patient presents to the ED for concern of left-sided abdominal and flank pain, this involves an extensive number of treatment options, and is a complaint that carries with it a high risk of complications and morbidity.  The differential diagnosis includes renal calculus, bowel obstruction, colitis, urinary tract infection   Co morbidities that complicate the patient evaluation  Chronic, recurrent abdominal pain   Additional history obtained:  No additional history taken from other individuals and no external records are  necessary   Lab Tests:  I Ordered, and personally interpreted labs.  The pertinent results include: CBC, metabolic panel, both of which are unremarkable   Imaging Studies ordered:  I ordered imaging studies including CT scan of the abdomen and pelvis I independently visualized and interpreted imaging which showed no acute intra-abdominal process and actually shows resolution of the inflammatory changes seen last week I agree with the radiologist interpretation   Cardiac Monitoring:  No cardiac monitoring indicated or performed   Medicines ordered and prescription drug management:  I ordered medication including morphine for pain and Zofran for nausea  Reevaluation of the patient after these medicines showed that the patient stayed the same I have reviewed the patients home medicines and have made adjustments as needed   Test Considered:  No other test considered  or indicated   Critical Interventions:  IV fluids and medications   Consultations Obtained:  I requested consultation with the hospitalist, Dr. Cyd Silence,  and discussed lab and imaging findings as well as pertinent plan - they recommend: Admission for hydration and pain control   Problem List / ED Course:  Patient is a 36 year old female, well-known to the emergency department for recurrent visits involving recurrent abdominal pain.  Jodi Mcgee was recently admitted for enteroinvasive E. coli, but completed a course of Cipro.  Apparently pain and diarrhea has returned and Jodi Mcgee reports being incontinent of stool.  Jodi Mcgee was seen earlier today at the GI clinic and had stool cultures obtained which are currently pending.  Jodi Mcgee denies to me Jodi Mcgee is having any fevers or chills. Work-up initiated including basic laboratory studies and CT scan of the abdomen and pelvis.  These were all unremarkable. Patient's pain and nausea were treated as above. When I returned to discuss the results of the patient's tests, I informed her that I  felt as though discharge with outpatient follow-up with her gastroenterologist would be the most appropriate course of action.  I explained to her that I saw nothing on the results that would warrant admission, however this was unacceptable to her.  Patient became upset with this and became argumentative and insistent upon staying in the hospital.  Jodi Mcgee ridiculed the work-up I had performed and accused me of "just sending her home to die".  After much drama and discussion, I finally agreed to discuss the case with the hospitalist who also questioned the need for admission, but was willing to accept for pain control and hydration.  [facility] (Dr. Paulita Fujita) also informed of the patient's pending admission through instant message. Patient has multiple visits over the past several years regarding painful conditions which are out of proportion with work-up obtained, and this is the case again tonight.  Jodi Mcgee has history of cocaine abuse and consistently test positive for cocaine and opiates.  Jodi Mcgee requested pain medication on multiple occasions while in the ED which were given somewhat reluctantly.   Reevaluation:  After the interventions noted above, I reevaluated the patient and found that they have :stayed the same   Social Determinants of Health:  Cocaine abuse   Dispostion:  After consideration of the diagnostic results and the patients response to treatment, I feel that the patent would benefit from admission for pain control and hydration.    Final Clinical Impression(s) / ED Diagnoses Final diagnoses:  None    Rx / DC Orders ED Discharge Orders     None         Veryl Speak, MD 11/04/21 [phone]

## 2021-11-04 LAB — CBC
HCT: 38.8 % (ref 36.0–46.0)
Hemoglobin: 12.3 g/dL (ref 12.0–15.0)
MCH: 27.5 pg (ref 26.0–34.0)
MCHC: 31.7 g/dL (ref 30.0–36.0)
MCV: 86.6 fL (ref 80.0–100.0)
Platelets: 263 10*3/uL (ref 150–400)
RBC: 4.48 MIL/uL (ref 3.87–5.11)
RDW: 14 % (ref 11.5–15.5)
WBC: 3.4 10*3/uL — ABNORMAL LOW (ref 4.0–10.5)
nRBC: 0 % (ref 0.0–0.2)

## 2021-11-04 LAB — COMPREHENSIVE METABOLIC PANEL
ALT: 13 U/L (ref 0–44)
AST: 11 U/L — ABNORMAL LOW (ref 15–41)
Albumin: 3.6 g/dL (ref 3.5–5.0)
Alkaline Phosphatase: 58 U/L (ref 38–126)
Anion gap: 4 — ABNORMAL LOW (ref 5–15)
BUN: 8 mg/dL (ref 6–20)
CO2: 30 mmol/L (ref 22–32)
Calcium: 8.9 mg/dL (ref 8.9–10.3)
Chloride: 105 mmol/L (ref 98–111)
Creatinine, Ser: 0.6 mg/dL (ref 0.44–1.00)
GFR, Estimated: >60 mL/min (ref >60)
Glucose, Bld: 100 mg/dL — ABNORMAL HIGH (ref 70–99)
Potassium: 3.4 mmol/L — ABNORMAL LOW (ref 3.5–5.1)
Sodium: 139 mmol/L (ref 135–145)
Total Bilirubin: 0.6 mg/dL (ref 0.3–1.2)
Total Protein: 7.3 g/dL (ref 6.5–8.1)

## 2021-11-04 LAB — GLUCOSE, CAPILLARY
Glucose-Capillary: 127 mg/dL — ABNORMAL HIGH (ref 70–99)
Glucose-Capillary: 110 mg/dL — ABNORMAL HIGH (ref 70–99)
Glucose-Capillary: 139 mg/dL — ABNORMAL HIGH (ref 70–99)
Glucose-Capillary: 154 mg/dL — ABNORMAL HIGH (ref 70–99)

## 2021-11-04 LAB — C DIFFICILE QUICK SCREEN W PCR REFLEX
C Diff antigen: NEGATIVE
C Diff interpretation: NOT DETECTED
C Diff toxin: NEGATIVE

## 2021-11-04 MED ORDER — ONDANSETRON HCL 4 MG/2ML IJ SOLN
4.0000 mg | Freq: Four times a day (QID) | INTRAMUSCULAR | Status: DC | PRN
  Administered 2021-11-04 – 2021-11-05 (×2): 4 mg via INTRAVENOUS
  Filled 2021-11-04 (×2): qty 2

## 2021-11-04 MED ORDER — SACCHAROMYCES BOULARDII 250 MG PO CAPS
250.0000 mg | ORAL_CAPSULE | Freq: Two times a day (BID) | ORAL | Status: DC
  Administered 2021-11-05: 250 mg via ORAL
  Filled 2021-11-04 (×2): qty 1

## 2021-11-04 MED ORDER — POTASSIUM CHLORIDE 10 MEQ/100ML IV SOLN
10.0000 meq | INTRAVENOUS | Status: AC
  Administered 2021-11-04 (×4): 10 meq via INTRAVENOUS
  Filled 2021-11-04 (×4): qty 100

## 2021-11-04 MED ORDER — GLUCERNA SHAKE PO LIQD
237.0000 mL | Freq: Three times a day (TID) | ORAL | Status: DC
  Administered 2021-11-04: 237 mL via ORAL
  Filled 2021-11-04 (×5): qty 237
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
## 2021-11-04 NOTE — Progress Notes (Signed)
Initial Nutrition Assessment  INTERVENTION:   -Glucerna Shake po TID, each supplement provides 220 kcal and 10 grams of protein   NUTRITION DIAGNOSIS:   Inadequate oral intake related to diarrhea (abdominal pain) as evidenced by per patient/family report.  GOAL:   Patient will meet greater than or equal to 90% of their needs  MONITOR:   PO intake, Supplement acceptance, Labs, Weight trends, I & O's  REASON FOR ASSESSMENT:   Malnutrition Screening Tool    ASSESSMENT:   36 y.o. female with medical history significant of chronic pain, diabetes, pulmonary embolism, SBO, pelvic adhesions, cocaine use, GERD, anxiety, IBS presenting with ongoing abdominal pain.  Patient in room, crying and upset. Would not say what upset her. States she still has an appetite but she continues to have diarrhea and some abdominal pain following intakes. Pt eating 100% of her meals.  She started drinking Glucerna shakes at home. RD to order.  Per weight records, pt has gained weight since 10/27/21.   Medications: Bentyl, Florastor, KCl  Labs reviewed: CBGs: 110-127 Low K  UDS+ cocaine, opiates  NUTRITION - FOCUSED PHYSICAL EXAM:  Flowsheet Row Most Recent Value  Orbital Region No depletion  Upper Arm Region No depletion  Thoracic and Lumbar Region No depletion  Buccal Region No depletion  Temple Region No depletion  Clavicle Bone Region No depletion  Clavicle and Acromion Bone Region No depletion  Scapular Bone Region No depletion  Dorsal Hand No depletion  Patellar Region No depletion  Anterior Thigh Region No depletion  Posterior Calf Region No depletion  Edema (RD Assessment) None  Hair Unable to assess  [covered]  Eyes Reviewed  Mouth Reviewed  Skin Reviewed       Diet Order:   Diet Order             DIET SOFT Room service appropriate? Yes; Fluid consistency: Thin  Diet effective now                   EDUCATION NEEDS:   Education needs have been addressed  Skin:   Skin Assessment: Reviewed RN Assessment  Last BM:  1/12  Height:   Ht Readings from Last 1 Encounters:  11/03/21 5\' 7"  (1.702 m)    Weight:   Wt Readings from Last 1 Encounters:  11/04/21 70.4 kg    BMI:  Body mass index is 24.31 kg/m.  Estimated Nutritional Needs:   Kcal:  1550-1750  Protein:  70-85g  Fluid:  1.8L/day  Jodi Bibles, MS, RD, LDN Inpatient Clinical Dietitian Contact information available via Amion

## 2021-11-04 NOTE — Progress Notes (Signed)
PROGRESS NOTE  Jodi Mcgee:[id number] DOB: 09/28/1986 DOA: 11/03/2021 PCP: [facility]  Brief History    Jodi Mcgee is a 36 y.o. female with medical history significant of chronic pain, diabetes, pulmonary embolism, SBO, pelvic adhesions, cocaine use, GERD, anxiety, IBS presenting with ongoing abdominal pain.  Patient has had 2 days of abdominal pain states this feels different than her previous abdominal pain.  She was recently admitted from 12/31 until 1/6 due to infectious colitis secondary to E. coli.  Seen by GI endoscopy done at that time.  She was treated with a full course of ciprofloxacin.  As below CT shows inflammatory changes of the colon have resolved.   He states that she initially got better after her course of antibiotics for her colitis.  She states that she did not developed abdominal pain and significant diarrhea.  She states that her diarrhea is watery and has been occurring around 10 times a day.  She states she seems to have a bowel movement with every time she eats as well. She states she saw GI the day prior to presentation and they are getting stool cultures.   She denies fevers, chills, chest pain, shortness of breath,  constipation, nausea, vomiting, bloody stool    ED Course: Vital signs in the ED significant for heart rate in the 90s to 100s and blood pressure in the 151V to 616W systolic.  CMP with potassium of 3, glucose 131, protein 8.5.  CBC within normal limits.  Rest were panel flu COVID-negative.  UDS showing opiates and cocaine.  Urinalysis with out evidence of infection.  CT of the abdomen pelvis showed no acute abnormality to explain her symptoms, previous inflammatory changes of the colon resolved, stable changes otherwise.  Patient received multiple course of pain medication and Benadryl in the ED with persistent pain despite this.  UDS for this patient was positive for cocaine.   [facility] was consulted to admit  the patient for further evaluation and care. The patient has had 2 BM's so far today. GI enteric panel has been submitted. GI was consulted. Dr. Paulita Fujita has evaluated the patient. He recommends that the patient be discharged to home on florastor and antidiarrheal to follow up with GI as outpatient. 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  The patient will be discharged to home once GI panel has resulted.  Consultants  Gastroenterology  Procedures  None  Antibiotics   Anti-infectives (From admission, onward)    None      Subjective  The patient is up and walking around the room as I enter the room. She sits on the bed and moves around in bed without difficulty. She then tells me that her back pain is "excruciating" and asks to have IV dilaudid back. I decline this. She has had one BM at this point.   Objective   Vitals:  Vitals:   11/04/21 0540 11/04/21 1313  BP: 124/78 (!) 155/107  Pulse: 85 90  Resp: 20 20  Temp: 98.7 F (37.1 C) 98.1 F (36.7 C)  SpO2: 100% 100%    Exam:  Constitutional:  The patient is awake, alert, and oriented x 3. No acute distress. Respiratory:  No increased work of breathing. No wheezes, rales, or rhonchi No tactile fremitus Cardiovascular:  Regular rate and rhythm No murmurs, ectopy, or gallups. No lateral PMI. No thrills. Abdomen:  Abdomen is soft, non-tender, non-distended No hernias, masses, or  organomegaly Normoactive bowel sounds.  Musculoskeletal:  No cyanosis, clubbing, or edema Skin:  No rashes, lesions, ulcers palpation of skin: no induration or nodules Neurologic:  CN 2-12 intact Sensation all 4 extremities intact Psychiatric:  Mental status Mood, affect appropriate Orientation to person, place, time  judgment and insight appear intact  I have personally reviewed the following:   Today's Data  Vitals  Lab Data  CMP CBC  Micro Data  GI panel is  pending.  Imaging  CT abdomen and pelvis  Cardiology Data    Other Data    Scheduled Meds:  amLODipine  5 mg Oral Daily   dicyclomine  20 mg Oral BID   enoxaparin (LOVENOX) injection  40 mg Subcutaneous Q24H   feeding supplement (GLUCERNA SHAKE)  237 mL Oral TID BM   insulin aspart  0-5 Units Subcutaneous QHS   insulin aspart  0-9 Units Subcutaneous TID WC   nicotine  7 mg Transdermal Daily   pantoprazole  20 mg Oral Daily   saccharomyces boulardii  250 mg Oral BID   sodium chloride flush  3 mL Intravenous Q12H    potassium chloride 10 mEq (11/04/21 1608)    Principal Problem:   Intractable abdominal pain Active Problems:   Type 2 diabetes mellitus, without long-term current use of insulin (HCC)   Chronic pain syndrome   Chronic hypertension   History of cocaine abuse ([place])   Hypokalemia   Diarrhea   LOS: 1 day   A & P  Nausea Intractable abdominal pain > Patient with a history of chronic pain and recent infectious colitis presenting with severe abdominal pain.  Pain unable to be improved with pain medication in the ED and patient excepted for observation. > On my exam her pain has improved but she is more concerned about her diarrhea. > No evidence of colitis currently with CT scan showed no acute normality and previous inflammatory changes have improved.  No fever, no leukocytosis.  No bloody stool. > Does have history of IBS, pelvic adhesive disease, cocaine use.  These entities could be playing a role. > Given that her diarrhea initially improved and then worsened again after her treatment with ciprofloxacin for her colitis.  We will check C. difficile panel. - We will give couple doses of as needed Dilaudid with Benadryl available for itching - We will need to transfer to p.o. pain control - Continuous pulse ox - Soft diet - C Diff is negative -  Enteric panel is pending. - Monitor overnight   Hypokalemia > Potassium noted to be 3.0 in the ED - Supplement   - Magnesium is 2.0.   Diabetes - SSI   Hypertension - Continue home amlodipine   History of cocaine use > UDS positive on admission.  Unclear how this could be contributing to her abdominal pain. - Encouraged cessation    IBS - Continue home Bentyl and as needed loperamide  I have seen and examined this patient myself. I have spent 34 minutes in her evaluation and plan.   DVT prophylaxis:      Lovenox  Code Status:              Full  Family Communication:       None on admission  Disposition Plan:              Patient is from:  Home             Anticipated DC to:                   Home             Anticipated DC date:               1 to 2 days             Anticipated DC barriers:         None      Zuhayr Deeney, DO [facility] Direct contact: see [email or web address]  7PM-7AM contact night coverage as above 11/04/2021, 4:37 PM  LOS: 1 day

## 2021-11-05 LAB — GASTROINTESTINAL PANEL BY PCR, STOOL (REPLACES STOOL CULTURE)

## 2021-11-05 LAB — GLUCOSE, CAPILLARY
Glucose-Capillary: 136 mg/dL — ABNORMAL HIGH (ref 70–99)
Glucose-Capillary: 109 mg/dL — ABNORMAL HIGH (ref 70–99)

## 2021-11-05 MED ORDER — RIFAXIMIN 200 MG PO TABS: 200.0000 mg | ORAL_TABLET | Freq: Three times a day (TID) | ORAL | 0 refills | Status: AC

## 2021-11-05 MED ORDER — SACCHAROMYCES BOULARDII 250 MG PO CAPS: 250.0000 mg | ORAL_CAPSULE | Freq: Two times a day (BID) | ORAL | 0 refills | Status: DC

## 2021-11-05 MED ORDER — GLUCERNA SHAKE PO LIQD: 237.0000 mL | Freq: Three times a day (TID) | ORAL | 0 refills | Status: DC

## 2021-11-05 MED ORDER — CIPROFLOXACIN HCL 250 MG PO TABS: 750.0000 mg | ORAL_TABLET | Freq: Every day | ORAL | 0 refills | Status: AC

## 2021-11-05 NOTE — Progress Notes (Signed)
Patient discharged to home, discharge instructions reviewed with patient who verbalized understanding.  

## 2021-11-05 NOTE — Plan of Care (Signed)
°  Problem: Clinical Measurements: °Goal: Ability to maintain clinical measurements within normal limits will improve °Outcome: Progressing °  °Problem: Clinical Measurements: °Goal: Diagnostic test results will improve °Outcome: Progressing °  °

## 2021-11-05 NOTE — Progress Notes (Signed)
. °  Transition of Care [facility]) Screening Note   Patient Details  Name: Jodi Mcgee Date of Birth: 28-Nov-1985   Transition of Care [facility]) CM/SW Contact:    Illene Regulus, LCSW Phone Number: 11/05/2021, 2:55 PM    Transition of Care Department [facility]) has reviewed patient and no TOC needs have been identified at this time. We will continue to monitor patient advancement through interdisciplinary progression rounds. If new patient transition needs arise, please place a TOC consult.

## 2021-11-06 NOTE — Discharge Summary (Signed)
Physician Discharge Summary  Jodi Mcgee WCB:[id number] DOB: [birth year] DOA: 11/03/2021  PCP: [facility]  Admit date: 11/03/2021 Discharge date: 11/06/2021  Recommendations for Outpatient Follow-up:  Patient is discharged to home Follow up with PCP in 7-10 days. Chemistry to be drawn on that visit and reported to PCP. Stop cocaine use. Follow up with GI as needed.  Discharge Diagnoses: Principal diagnosis is #1 Enteroaggregative E coli colitis Abdominal pain N/V/D Cocaine abuse Hypokalemia Diabetees Hypertension Post-infectious IBS  Discharge Condition: Fair  Disposition: Home  Diet recommendation: Carbohydrate modified  Filed Weights   11/03/21 0200 11/04/21 0540  Weight: 63.5 kg 70.4 kg    History of present illness:   Jodi Mcgee is a 36 y.o. female with medical history significant of chronic pain, diabetes, pulmonary embolism, SBO, pelvic adhesions, cocaine use, GERD, anxiety, IBS presenting with ongoing abdominal pain.  Patient has had 2 days of abdominal pain states this feels different than her previous abdominal pain.  She was recently admitted from 12/31 until 1/6 due to infectious colitis secondary to E. coli.  Seen by GI endoscopy done at that time.  She was treated with a full course of ciprofloxacin.  As below CT shows inflammatory changes of the colon have resolved.   He states that she initially got better after her course of antibiotics for her colitis.  She states that she did not developed abdominal pain and significant diarrhea.  She states that her diarrhea is watery and has been occurring around 10 times a day.  She states she seems to have a bowel movement with every time she eats as well. She states she saw GI the day prior to presentation and they are getting stool cultures.   She denies fevers, chills, chest pain, shortness of breath,  constipation, nausea, vomiting, bloody stool    ED Course: Vital  signs in the ED significant for heart rate in the 90s to 100s and blood pressure in the 616W to 737T systolic.  CMP with potassium of 3, glucose 131, protein 8.5.  CBC within normal limits.  Rest were panel flu COVID-negative.  UDS showing opiates and cocaine.  Urinalysis with out evidence of infection.  CT of the abdomen pelvis showed no acute abnormality to explain her symptoms, previous inflammatory changes of the colon resolved, stable changes otherwise.  Patient received multiple course of pain medication and Benadryl in the ED with persistent pain despite this. UDS for this patient was positive for cocaine.    Hospital Course:  [facility] to admit the patient for further evaluation and care. The patient has had 2 BM's so far today. GI enteric panel has been submitted. GI was consulted. Dr. Paulita Fujita has evaluated the patient. He recommends that the patient be discharged to home on florastor and antidiarrheal to follow up with GI as outpatient. 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  The patient's diarrhea and vomiting resolved.Her Enteric pathogen panel returned positive for enteroaggragative e coli. The patient was discharged to home on rifaximin and cipro.   Today's assessment: S: The patient is sleeping. She is easily awoken. She states that she does not feel good. O: Vitals:  Vitals:   11/05/21 0552 11/05/21 1250  BP: (!) 164/125 (!) 161/103  Pulse: (!) 105 (!) 105  Resp: 17 20  Temp: 98.2 F (36.8 C) 98.3 F (36.8 C)  SpO2: 100% 100%    Exam:  Constitutional:  The  patient is awake, alert, and oriented x 3. No acute distress. Respiratory:  No increased work of breathing. No wheezes, rales, or rhonchi No tactile fremitus Cardiovascular:  Regular rate and rhythm No murmurs, ectopy, or gallups. No lateral PMI. No thrills. Abdomen:  Abdomen is soft, non-tender, non-distended No hernias, masses, or  organomegaly Normoactive bowel sounds.  Musculoskeletal:  No cyanosis, clubbing, or edema Skin:  No rashes, lesions, ulcers palpation of skin: no induration or nodules Neurologic:  CN 2-12 intact Sensation all 4 extremities intact Psychiatric:  Mental status Mood, affect appropriate Orientation to person, place, time  judgment and insight appear intact  Discharge Instructions  Discharge Instructions     Activity as tolerated - No restrictions   Complete by: As directed    Call MD for:  persistant nausea and vomiting   Complete by: As directed    Call MD for:  severe uncontrolled pain   Complete by: As directed    Diet - low sodium heart healthy   Complete by: As directed    Drink plenty of water.   Discharge instructions   Complete by: As directed    Discharge to home.  Follow up with PCP in 7-10 days. Have chemistry checked on that visit. Stop cocaine use. Follow up with GI as needed.   Increase activity slowly   Complete by: As directed       Allergies as of 11/05/2021       Reactions   Hydrocodone Itching   Iodinated Contrast Media Itching, Other (See Comments)   Mild itching after IV contrast on 06/01/2017No urticaria visible, wheezing, or angioedema Mild itching after IV contrast on 06/01/2017No urticaria visible, wheezing, or angioedema   Cephalexin Hives   Cetirizine & Related Itching, Swelling   Toradol [ketorolac Tromethamine] Hives, Itching   Flagyl [metronidazole] Itching, Swelling   Nsaids Rash        Medication List     STOP taking these medications    loperamide 2 MG capsule Commonly known as: IMODIUM   tizanidine 2 MG capsule Commonly known as: Zanaflex       TAKE these medications    amLODipine 5 MG tablet Commonly known as: NORVASC Take 1 tablet (5 mg total) by mouth daily.   blood glucose meter kit and supplies Kit Dispense based on patient and insurance preference. Use up to four times daily as directed. (FOR ICD-9 250.00,  250.01).   ciprofloxacin 250 MG tablet Commonly known as: CIPRO Take 3 tablets (750 mg total) by mouth daily for 3 days.   dicyclomine 20 MG tablet Commonly known as: BENTYL Take 1 tablet (20 mg total) by mouth 2 (two) times daily.   feeding supplement (GLUCERNA SHAKE) Liqd Take 237 mLs by mouth 3 (three) times daily between meals.   HumuLIN R 100 units/mL injection Generic drug: insulin regular Inject 20 Units into the skin 3 (three) times daily before meals. Per sliding scale   levonorgestrel 19.5 MCG/DAY Iud IUD Commonly known as: LILETTA 1 each by Intrauterine route continuous. Placed in 2019   nicotine 7 mg/24hr patch Commonly known as: NICODERM CQ - dosed in mg/24 hr Place 1 patch (7 mg total) onto the skin daily.   ondansetron 4 MG disintegrating tablet Commonly known as: Zofran ODT 47m ODT q4 hours prn nausea/vomit What changed:  how much to take how to take this when to take this reasons to take this additional instructions   oxyCODONE 5 MG immediate release tablet Commonly known as: Oxy IR/ROXICODONE  Take 1 tablet (5 mg total) by mouth every 4 (four) hours as needed for severe pain.   pantoprazole 20 MG tablet Commonly known as: PROTONIX Take 1 tablet (20 mg total) by mouth daily.   rifaximin 200 MG tablet Commonly known as: XIFAXAN Take 1 tablet (200 mg total) by mouth 3 (three) times daily for 3 days.   saccharomyces boulardii 250 MG capsule Commonly known as: FLORASTOR Take 1 capsule (250 mg total) by mouth 2 (two) times daily.       Allergies  Allergen Reactions   Hydrocodone Itching   Iodinated Contrast Media Itching and Other (See Comments)    Mild itching after IV contrast on 06/01/2017No urticaria visible, wheezing, or angioedema  Mild itching after IV contrast on 06/01/2017No urticaria visible, wheezing, or angioedema   Cephalexin Hives   Cetirizine & Related Itching and Swelling   Toradol [Ketorolac Tromethamine] Hives and Itching    Flagyl [Metronidazole] Itching and Swelling   Nsaids Rash    The results of significant diagnostics from this hospitalization (including imaging, microbiology, ancillary and laboratory) are listed below for reference.    Significant Diagnostic Studies: CT ABDOMEN PELVIS WO CONTRAST  Result Date: 11/03/2021 CLINICAL DATA:  36 year old female with history of acute onset of nonlocalized abdominal pain radiating to the back. Uncontrolled diarrhea. EXAM: CT ABDOMEN AND PELVIS WITHOUT CONTRAST TECHNIQUE: Multidetector CT imaging of the abdomen and pelvis was performed following the standard protocol without IV contrast. RADIATION DOSE REDUCTION: This exam was performed according to the departmental dose-optimization program which includes automated exposure control, adjustment of the mA and/or kV according to patient size and/or use of iterative reconstruction technique. COMPARISON:  CT the abdomen and pelvis 10/23/2021. FINDINGS: Lower chest: Unremarkable. Hepatobiliary: No suspicious cystic or solid hepatic lesions are confidently identified on today's noncontrast CT examination. Unenhanced appearance of the gallbladder is normal. Pancreas: No definite pancreatic mass or peripancreatic fluid collections or inflammatory changes are confidently identified on today's noncontrast CT examination. Spleen: Unremarkable. Adrenals/Urinary Tract: No calcifications are identified within the collecting system of either kidney, along the course of either ureter, or within the lumen of the urinary bladder. No hydroureteronephrosis. Bilateral kidneys and the left adrenal gland are normal in appearance. 2.2 x 1.2 cm intermediate attenuation (33 HU) right adrenal nodule, similar to recent prior studies, incompletely characterized, but potentially a lipid poor adenoma. Unenhanced appearance of the urinary bladder is unremarkable. Stomach/Bowel: Unenhanced appearance of the stomach is normal. No pathologic dilatation of small  bowel or colon. Previously noted pericolonic inflammatory changes have resolved. The appendix is not confidently identified and may be surgically absent. Regardless, there are no inflammatory changes noted adjacent to the cecum to suggest the presence of an acute appendicitis at this time. Vascular/Lymphatic: No atherosclerotic calcifications are noted in the abdominal aorta or pelvic vasculature. No lymphadenopathy identified in the abdomen or pelvis. Reproductive: IUD present in the uterus. Ovaries are unremarkable in appearance. Other: No significant volume of ascites.  No pneumoperitoneum. Musculoskeletal: There are no aggressive appearing lytic or blastic lesions noted in the visualized portions of the skeleton. IMPRESSION: 1. No acute abnormality in the abdomen or pelvis to account for the patient's symptoms. Previously noted pericolonic inflammatory changes have resolved. 2. Incidental findings, similar to prior studies, as above Electronically Signed   By: Vinnie Langton M.D.   On: 11/03/2021 05:13   CT ABDOMEN PELVIS WO CONTRAST  Result Date: 10/23/2021 CLINICAL DATA:  Left lower quadrant abdominal pain EXAM: CT ABDOMEN AND PELVIS WITHOUT  CONTRAST TECHNIQUE: Multidetector CT imaging of the abdomen and pelvis was performed following the standard protocol without IV contrast. COMPARISON:  10/02/2021, 09/27/2021 FINDINGS: Lower chest: Nodular infiltrate has developed within the a lingula, best seen on image # 5/4, likely infectious or inflammatory. The visualized lung bases are otherwise clear. Visualized heart and pericardium are unremarkable. Hepatobiliary: No focal liver abnormality is seen. No gallstones, gallbladder wall thickening, or biliary dilatation. Pancreas: Unremarkable Spleen: Unremarkable Adrenals/Urinary Tract: Adrenal glands are unremarkable. Kidneys are normal, without renal calculi, focal lesion, or hydronephrosis. Bladder is unremarkable. Stomach/Bowel: Since the prior examination,  there has developed extensive pericolonic inflammatory stranding involving the transverse, descending, and rectosigmoid colon in keeping with an infectious or inflammatory proctocolitis. These changes appear progressive since immediate prior examination and appear more severe than on remote prior examination of 09/19/2021 where this was first identified. No evidence of obstruction. No free intraperitoneal gas or fluid. Stomach and small bowel are unremarkable. The appendix is not clearly identified and may be absent. Vascular/Lymphatic: No significant vascular findings are present. No enlarged abdominal or pelvic lymph nodes. Reproductive: Intrauterine device in expected position within the endometrial cavity. The pelvic organs are otherwise unremarkable. Other: No abdominal wall hernia. Musculoskeletal: No acute bone abnormality. No lytic or blastic bone lesion. IMPRESSION: Recurrent inflammatory changes in keeping with extensive proctocolitis. This appears progressive since immediate prior examination and more severe than remote prior examination of 09/19/2021. Given the history of recent antibiotic therapy, pseudomembranous colitis should be considered. Alternatively, inflammatory conditions such as ulcerative colitis could appear in this fashion. No evidence of obstruction or perforation. Minimal nodular infiltrate within the lingula, nonspecific, possibly infectious or inflammatory. Electronically Signed   By: Fidela Salisbury M.D.   On: 10/23/2021 04:04    Microbiology: Recent Results (from the past 240 hour(s))  Resp Panel by RT-PCR (Flu A&B, Covid) Nasopharyngeal Swab     Status: None   Collection Time: 11/03/21  8:55 AM   Specimen: Nasopharyngeal Swab; Nasopharyngeal(NP) swabs in vial transport medium  Result Value Ref Range Status   SARS Coronavirus 2 by RT PCR NEGATIVE NEGATIVE Final    Comment: (NOTE) SARS-CoV-2 target nucleic acids are NOT DETECTED.  The SARS-CoV-2 RNA is generally detectable  in upper respiratory specimens during the acute phase of infection. The lowest concentration of SARS-CoV-2 viral copies this assay can detect is 138 copies/mL. A negative result does not preclude SARS-Cov-2 infection and should not be used as the sole basis for treatment or other patient management decisions. A negative result may occur with  improper specimen collection/handling, submission of specimen other than nasopharyngeal swab, presence of viral mutation(s) within the areas targeted by this assay, and inadequate number of viral copies(<138 copies/mL). A negative result must be combined with clinical observations, patient history, and epidemiological information. The expected result is Negative.  Fact Sheet for Patients:  EntrepreneurPulse.com.au  Fact Sheet for Healthcare Providers:  IncredibleEmployment.be  This test is no t yet approved or cleared by the [place] FDA and  has been authorized for detection and/or diagnosis of SARS-CoV-2 by FDA under an Emergency Use Authorization (EUA). This EUA will remain  in effect (meaning this test can be used) for the duration of the COVID-19 declaration under Section 564(b)(1) of the Act, 21 U.S.C.section 360bbb-3(b)(1), unless the authorization is terminated  or revoked sooner.       Influenza A by PCR NEGATIVE NEGATIVE Final   Influenza B by PCR NEGATIVE NEGATIVE Final    Comment: (NOTE) The Xpert  Xpress SARS-CoV-2/FLU/RSV plus assay is intended as an aid in the diagnosis of influenza from Nasopharyngeal swab specimens and should not be used as a sole basis for treatment. Nasal washings and aspirates are unacceptable for Xpert Xpress SARS-CoV-2/FLU/RSV testing.  Fact Sheet for Patients: EntrepreneurPulse.com.au  Fact Sheet for Healthcare Providers: IncredibleEmployment.be  This test is not yet approved or cleared by the [place] FDA and has  been authorized for detection and/or diagnosis of SARS-CoV-2 by FDA under an Emergency Use Authorization (EUA). This EUA will remain in effect (meaning this test can be used) for the duration of the COVID-19 declaration under Section 564(b)(1) of the Act, 21 U.S.C. section 360bbb-3(b)(1), unless the authorization is terminated or revoked.  Performed at [facility], [place], [place] [address]   C Difficile Quick Screen w PCR reflex     Status: None   Collection Time: 11/03/21  8:03 PM   Specimen: STOOL  Result Value Ref Range Status   C Diff antigen NEGATIVE NEGATIVE Final   C Diff toxin NEGATIVE NEGATIVE Final   C Diff interpretation No C. difficile detected.  Final    Comment: Performed at [facility], [place] [address], [place], [place] [address]  Gastrointestinal Panel by PCR , Stool     Status: Abnormal   Collection Time: 11/04/21  5:12 PM   Specimen: Stool  Result Value Ref Range Status   Campylobacter species NOT DETECTED NOT DETECTED Final   Plesimonas shigelloides NOT DETECTED NOT DETECTED Final   Salmonella species NOT DETECTED NOT DETECTED Final   Yersinia enterocolitica NOT DETECTED NOT DETECTED Final   Vibrio species NOT DETECTED NOT DETECTED Final   Vibrio cholerae NOT DETECTED NOT DETECTED Final   Enteroaggregative E coli (EAEC) DETECTED (A) NOT DETECTED Final    Comment: RESULT CALLED TO, READ BACK BY AND VERIFIED WITH: CINDY HUGHEY AT 6834 11/05/21.PMF    Enteropathogenic E coli (EPEC) NOT DETECTED NOT DETECTED Final   Enterotoxigenic E coli (ETEC) NOT DETECTED NOT DETECTED Final   Shiga like toxin producing E coli (STEC) NOT DETECTED NOT DETECTED Final   Shigella/Enteroinvasive E coli (EIEC) NOT DETECTED NOT DETECTED Final   Cryptosporidium NOT DETECTED NOT DETECTED Final   Cyclospora cayetanensis NOT DETECTED NOT DETECTED Final   Entamoeba histolytica NOT DETECTED NOT DETECTED Final   Giardia lamblia NOT  DETECTED NOT DETECTED Final   Adenovirus F40/41 NOT DETECTED NOT DETECTED Final   Astrovirus NOT DETECTED NOT DETECTED Final   Norovirus GI/GII NOT DETECTED NOT DETECTED Final   Rotavirus A NOT DETECTED NOT DETECTED Final   Sapovirus (I, II, IV, and V) NOT DETECTED NOT DETECTED Final    Comment: Performed at [facility], [address] [place],  [address]     Labs: Basic Metabolic Panel: Recent Labs  Lab 11/03/21 0200 11/03/21 2015 11/04/21 0829  NA 138  --  139  K 3.0*  --  3.4*  CL 99  --  105  CO2 29  --  30  GLUCOSE 131*  --  100*  BUN 8  --  8  CREATININE 0.65  --  0.60  CALCIUM 9.9  --  8.9  MG  --  2.0  --    Liver Function Tests: Recent Labs  Lab 11/03/21 0200 11/04/21 0829  AST 11* 11*  ALT 11 13  ALKPHOS 65 58  BILITOT 1.0 0.6  PROT 8.5* 7.3  ALBUMIN 4.5 3.6   No results for input(s):  LIPASE, AMYLASE in the last 168 hours. No results for input(s): AMMONIA in the last 168 hours. CBC: Recent Labs  Lab 11/03/21 0200 11/04/21 0829  WBC 7.7 3.4*  NEUTROABS 5.7  --   HGB 13.3 12.3  HCT 41.4 38.8  MCV 84.3 86.6  PLT 352 263   Cardiac Enzymes: No results for input(s): CKTOTAL, CKMB, CKMBINDEX, TROPONINI in the last 168 hours. BNP: BNP (last 3 results) No results for input(s): BNP in the last 8760 hours.  ProBNP (last 3 results) No results for input(s): PROBNP in the last 8760 hours.  CBG: Recent Labs  Lab 11/04/21 1135 11/04/21 1821 11/04/21 2145 11/05/21 0743 11/05/21 1134  GLUCAP 110* 139* 154* 136* 109*    Principal Problem:   Intractable abdominal pain Active Problems:   Type 2 diabetes mellitus, without long-term current use of insulin (HCC)   Chronic pain syndrome   Chronic hypertension   History of cocaine abuse ([place])   Hypokalemia   Diarrhea   Time coordinating discharge: 38 minutes.  Signed:        Keyandre Pileggi, DO [facility]  11/06/2021, 8:13 AM

## 2021-11-08 ENCOUNTER — Encounter (HOSPITAL_COMMUNITY): Payer: Self-pay | Admitting: Emergency Medicine

## 2021-11-08 ENCOUNTER — Emergency Department (HOSPITAL_COMMUNITY): Payer: Medicaid Other

## 2021-11-08 ENCOUNTER — Emergency Department (HOSPITAL_COMMUNITY)
Admission: EM | Admit: 2021-11-08 | Discharge: 2021-11-08 | Disposition: A | Discharge status: Home / Self Care | Payer: Medicaid Other | Attending: Emergency Medicine | Admitting: Emergency Medicine

## 2021-11-08 ENCOUNTER — Other Ambulatory Visit: Payer: Self-pay

## 2021-11-08 DIAGNOSIS — R69 Illness, unspecified: Secondary | ICD-10-CM | POA: Insufficient documentation

## 2021-11-08 DIAGNOSIS — R1084 Generalized abdominal pain: Secondary | ICD-10-CM

## 2021-11-08 DIAGNOSIS — R197 Diarrhea, unspecified: Secondary | ICD-10-CM | POA: Diagnosis not present

## 2021-11-08 DIAGNOSIS — M549 Dorsalgia, unspecified: Secondary | ICD-10-CM | POA: Diagnosis not present

## 2021-11-08 DIAGNOSIS — R3912 Poor urinary stream: Secondary | ICD-10-CM | POA: Diagnosis not present

## 2021-11-08 LAB — URINALYSIS, ROUTINE W REFLEX MICROSCOPIC
Bacteria, UA: NONE SEEN
Bilirubin Urine: NEGATIVE
Glucose, UA: NEGATIVE mg/dL
Hgb urine dipstick: NEGATIVE
Ketones, ur: 5 mg/dL — AB
Leukocytes,Ua: NEGATIVE
Nitrite: NEGATIVE
Protein, ur: 30 mg/dL — AB
Specific Gravity, Urine: 1.017 (ref 1.005–1.030)
pH: 5 (ref 5.0–8.0)

## 2021-11-08 LAB — COMPREHENSIVE METABOLIC PANEL
ALT: 11 U/L (ref 0–44)
AST: 15 U/L (ref 15–41)
Albumin: 4.3 g/dL (ref 3.5–5.0)
Alkaline Phosphatase: 70 U/L (ref 38–126)
Anion gap: 7 (ref 5–15)
BUN: 19 mg/dL (ref 6–20)
CO2: 31 mmol/L (ref 22–32)
Calcium: 9.6 mg/dL (ref 8.9–10.3)
Chloride: 97 mmol/L — ABNORMAL LOW (ref 98–111)
Creatinine, Ser: 0.8 mg/dL (ref 0.44–1.00)
GFR, Estimated: >60 mL/min (ref >60)
Glucose, Bld: 119 mg/dL — ABNORMAL HIGH (ref 70–99)
Potassium: 3.4 mmol/L — ABNORMAL LOW (ref 3.5–5.1)
Sodium: 135 mmol/L (ref 135–145)
Total Bilirubin: 1.2 mg/dL (ref 0.3–1.2)
Total Protein: 8.9 g/dL — ABNORMAL HIGH (ref 6.5–8.1)

## 2021-11-08 LAB — CBC WITH DIFFERENTIAL/PLATELET
Abs Immature Granulocytes: 0.01 10*3/uL (ref 0.00–0.07)
Basophils Absolute: 0 10*3/uL (ref 0.0–0.1)
Basophils Relative: 1 %
Eosinophils Absolute: 0.1 10*3/uL (ref 0.0–0.5)
Eosinophils Relative: 2 %
HCT: 43.7 % (ref 36.0–46.0)
Hemoglobin: 14.2 g/dL (ref 12.0–15.0)
Immature Granulocytes: 0 %
Lymphocytes Relative: 23 %
Lymphs Abs: 1.5 10*3/uL (ref 0.7–4.0)
MCH: 27 pg (ref 26.0–34.0)
MCHC: 32.5 g/dL (ref 30.0–36.0)
MCV: 83.2 fL (ref 80.0–100.0)
Monocytes Absolute: 0.5 10*3/uL (ref 0.1–1.0)
Monocytes Relative: 8 %
Neutro Abs: 4.3 10*3/uL (ref 1.7–7.7)
Neutrophils Relative %: 66 %
Platelets: 370 10*3/uL (ref 150–400)
RBC: 5.25 MIL/uL — ABNORMAL HIGH (ref 3.87–5.11)
RDW: 14 % (ref 11.5–15.5)
WBC: 6.4 10*3/uL (ref 4.0–10.5)
nRBC: 0 % (ref 0.0–0.2)

## 2021-11-08 LAB — RAPID URINE DRUG SCREEN, HOSP PERFORMED
Amphetamines: NOT DETECTED
Barbiturates: NOT DETECTED
Benzodiazepines: NOT DETECTED
Cocaine: POSITIVE — AB
Opiates: NOT DETECTED
Tetrahydrocannabinol: NOT DETECTED

## 2021-11-08 LAB — LIPASE, BLOOD: Lipase: 27 U/L (ref 11–51)

## 2021-11-08 LAB — I-STAT BETA HCG BLOOD, ED (MC, WL, AP ONLY): I-stat hCG, quantitative: <5 m[IU]/mL (ref <5)

## 2021-11-08 MED ORDER — ONDANSETRON HCL 4 MG/2ML IJ SOLN
4.0000 mg | Freq: Once | INTRAMUSCULAR | Status: AC
  Administered 2021-11-08: 4 mg via INTRAVENOUS
  Filled 2021-11-08: qty 2

## 2021-11-08 MED ORDER — LORAZEPAM 2 MG/ML IJ SOLN
0.5000 mg | Freq: Once | INTRAMUSCULAR | Status: AC
  Administered 2021-11-08: 0.5 mg via INTRAVENOUS
  Filled 2021-11-08: qty 1

## 2021-11-08 MED ORDER — HYDROMORPHONE HCL 1 MG/ML IJ SOLN
0.5000 mg | Freq: Once | INTRAMUSCULAR | Status: AC
  Administered 2021-11-08: 0.5 mg via INTRAVENOUS
  Filled 2021-11-08: qty 1

## 2021-11-08 MED ORDER — DIPHENHYDRAMINE HCL 50 MG/ML IJ SOLN
50.0000 mg | Freq: Once | INTRAMUSCULAR | Status: AC
Start: 2021-11-08 — End: 2021-11-08
  Administered 2021-11-08: 50 mg via INTRAVENOUS
  Filled 2021-11-08: qty 1

## 2021-11-08 MED ORDER — SODIUM CHLORIDE 0.9 % IV BOLUS
1000.0000 mL | Freq: Once | INTRAVENOUS | Status: AC
  Administered 2021-11-08: 1000 mL via INTRAVENOUS

## 2021-11-08 MED ORDER — DICYCLOMINE HCL 20 MG PO TABS: 20.0000 mg | ORAL_TABLET | Freq: Two times a day (BID) | ORAL | 0 refills | Status: DC

## 2021-11-08 NOTE — Discharge Instructions (Addendum)
Return for any problem.  ?

## 2021-11-08 NOTE — ED Triage Notes (Signed)
Pt BIB EMS from home, reported E-Coli poisoning for the past month. Reported uncontrolled diarrhea. Passed out this morning.   BP 107/62 P 106 RR 18 SpO2 100 RA CBG 180

## 2021-11-08 NOTE — ED Provider Notes (Signed)
[facility] DEPT Provider Note   CSN: [id number] Arrival date & time: 11/08/21  0856     History  Chief Complaint  Patient presents with   Near Syncope    Jodi Mcgee is a 36 y.o. female.  36 year old female with prior medical history as detailed below presents with complaint of abdominal pain.  Patient reports diarrhea.  Patient reports recent admission with treatment of similar symptoms.  She denies vomiting.  She reports decreased urinary output x 2 days.  Additional history is not forthcoming.  Patient was not cooperative with exam or questioning.  The history is provided by the patient.  Illness Location:  Abdominal pain, back pain, decreased urination Severity:  Moderate Onset quality:  Gradual Duration:  2 days Timing:  Constant Progression:  Waxing and waning Chronicity:  Recurrent     Home Medications Prior to Admission medications   Medication Sig Start Date End Date Taking? Authorizing Provider  amLODipine (NORVASC) 5 MG tablet Take 1 tablet (5 mg total) by mouth daily. 10/28/21   Nita Sells, MD  blood glucose meter kit and supplies KIT Dispense based on patient and insurance preference. Use up to four times daily as directed. (FOR ICD-9 250.00, 250.01). 02/21/19   Oretha Milch D, MD  ciprofloxacin (CIPRO) 250 MG tablet Take 3 tablets (750 mg total) by mouth daily for 3 days. 11/05/21 11/08/21  [name], [name], DO  dicyclomine (BENTYL) 20 MG tablet Take 1 tablet (20 mg total) by mouth 2 (two) times daily. 10/02/21   Azucena Cecil, PA-C  feeding supplement, GLUCERNA SHAKE, (GLUCERNA SHAKE) LIQD Take 237 mLs by mouth 3 (three) times daily between meals. 11/05/21   [name], [name], DO  HUMULIN R 100 UNIT/ML injection Inject 20 Units into the skin 3 (three) times daily before meals. Per sliding scale 03/24/20   [provider]  Levonorgestrel (LILETTA) 19.5 MCG/DAY IUD IUD 1 each by Intrauterine route continuous. Placed  in 2019    [provider]  nicotine (NICODERM CQ - DOSED IN MG/24 HR) 7 mg/24hr patch Place 1 patch (7 mg total) onto the skin daily. 10/28/21   Nita Sells, MD  ondansetron (ZOFRAN ODT) 4 MG disintegrating tablet 91m ODT q4 hours prn nausea/vomit Patient taking differently: Take 4 mg by mouth every 4 (four) hours as needed for nausea or vomiting. 09/04/21   Mesner, JCorene Cornea MD  oxyCODONE (OXY IR/ROXICODONE) 5 MG immediate release tablet Take 1 tablet (5 mg total) by mouth every 4 (four) hours as needed for severe pain. 10/28/21   SNita Sells MD  pantoprazole (PROTONIX) 20 MG tablet Take 1 tablet (20 mg total) by mouth daily. 10/02/21   GAzucena Cecil PA-C  rifaximin (XIFAXAN) 200 MG tablet Take 1 tablet (200 mg total) by mouth 3 (three) times daily for 3 days. 11/05/21 11/08/21  [name], [name], DO  saccharomyces boulardii (FLORASTOR) 250 MG capsule Take 1 capsule (250 mg total) by mouth 2 (two) times daily. 11/05/21   [name], [name], DO      Allergies    Hydrocodone, Iodinated contrast media, Cephalexin, Cetirizine & related, Toradol [ketorolac tromethamine], Flagyl [metronidazole], and Nsaids    Review of Systems   Review of Systems  All other systems reviewed and are negative.  Physical Exam Updated Vital Signs BP 112/76 (BP Location: Left Arm)    Pulse 94    Resp 14    SpO2 100%  Physical Exam Vitals and nursing note reviewed.  Constitutional:  General: She is not in acute distress.    Appearance: Normal appearance. She is well-developed.  HENT:     Head: Normocephalic and atraumatic.  Eyes:     Conjunctiva/sclera: Conjunctivae normal.     Pupils: Pupils are equal, round, and reactive to light.  Cardiovascular:     Rate and Rhythm: Normal rate and regular rhythm.     Heart sounds: Normal heart sounds.  Pulmonary:     Effort: Pulmonary effort is normal. No respiratory distress.     Breath sounds: Normal breath sounds.  Abdominal:     General: There is  no distension.     Palpations: Abdomen is soft.     Tenderness: There is abdominal tenderness.     Comments: Diffuse tenderness all 4 extremities  Musculoskeletal:        General: No deformity. Normal range of motion.     Cervical back: Normal range of motion and neck supple.  Skin:    General: Skin is warm and dry.  Neurological:     General: No focal deficit present.     Mental Status: She is alert and oriented to person, place, and time.    ED Results / Procedures / Treatments   Labs (all labs ordered are listed, but only abnormal results are displayed) Labs Reviewed  COMPREHENSIVE METABOLIC PANEL - Abnormal; Notable for the following components:      Result Value   Potassium 3.4 (*)    Chloride 97 (*)    Glucose, Bld 119 (*)    Total Protein 8.9 (*)    All other components within normal limits  CBC WITH DIFFERENTIAL/PLATELET - Abnormal; Notable for the following components:   RBC 5.25 (*)    All other components within normal limits  LIPASE, BLOOD  URINALYSIS, ROUTINE W REFLEX MICROSCOPIC  RAPID URINE DRUG SCREEN, HOSP PERFORMED  I-STAT BETA HCG BLOOD, ED ([facility], [facility], [facility] ONLY)    EKG EKG Interpretation  Date/Time:  Tuesday November 08 2021 09:39:37 EST Ventricular Rate:  97 PR Interval:  146 QRS Duration: 95 QT Interval:  360 QTC Calculation: 458 R Axis:   79 Text Interpretation: Sinus rhythm Probable left atrial enlargement Confirmed by Dene Gentry [phone]) on 11/08/2021 9:48:07 AM  Radiology CT ABDOMEN PELVIS WO CONTRAST  Result Date: 11/08/2021 CLINICAL DATA:  Abdominal pain, acute, nonlocalized EXAM: CT ABDOMEN AND PELVIS WITHOUT CONTRAST TECHNIQUE: Multidetector CT imaging of the abdomen and pelvis was performed following the standard protocol without IV contrast. RADIATION DOSE REDUCTION: This exam was performed according to the departmental dose-optimization program which includes automated exposure control, adjustment of the mA and/or kV according to patient  size and/or use of iterative reconstruction technique. COMPARISON:  11/03/2021 FINDINGS: Lower chest: No acute abnormality. Hepatobiliary: No focal liver abnormality is seen. No gallstones, gallbladder wall thickening, or biliary dilatation. Pancreas: Unremarkable. Spleen: Unremarkable. 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 Kidneys and left adrenal are unremarkable. Bladder is unremarkable. Stomach/Bowel: Stomach is within normal limits. Bowel is normal in caliber. Appendix is not visualized. No inflammatory changes are present in the area. Vascular/Lymphatic: No significant vascular abnormality on this noncontrast study. No enlarged nodes. Reproductive: IUD is present.  No adnexal mass. Other: No free fluid.  No acute abnormality of the abdominal wall. Musculoskeletal: No acute osseous abnormality. IMPRESSION: No acute abnormality or significant change since recent prior study. Electronically Signed   By: Macy Mis M.D.   On: 11/08/2021 09:38    Procedures Procedures  Medications Ordered in ED Medications  sodium chloride 0.9 % bolus 1,000 mL (1,000 mLs Intravenous New Bag/Given 11/08/21 1039)  HYDROmorphone (DILAUDID) injection 0.5 mg (0.5 mg Intravenous Given 11/08/21 1038)  ondansetron (ZOFRAN) injection 4 mg (4 mg Intravenous Given 11/08/21 1039)    ED Course/ Medical Decision Making/ A&P                           Medical Decision Making Amount and/or Complexity of Data Reviewed Labs: ordered. Radiology: ordered.  Risk Prescription drug management.    Medical Screen Complete  This patient presented to the ED with complaint of abdominal pain.  This complaint involves an extensive number of treatment options. The initial differential diagnosis includes, but is not limited to, intra-abdominal pathology, metabolic abnormality, infectious process  This presentation is: Acute, Chronic, Previously Undiagnosed, Uncertain  Prognosis, Complicated, Systemic Symptoms, and Threat to Life/Bodily Function  Patient is presenting with complaint of abdominal discomfort.  This appears to be recurrent.  Patient with recent admission and treatment of same.  She was seen by GI while hospitalized earlier this month.  Patient's symptoms treated with antiemetics and pain medication.  Screening labs obtained.  Repeat abdominal imaging obtained.  No significant acute pathology found on CT or on labs  Patient does feel significantly improved after treatment here in the ED.  She is taking good p.o.  Pain is controlled.    She does understand need for close follow-up.   Strict return precautions given and understood.     Co morbidities that complicated the patient's evaluation  Prior abdominal pain, recent admission for same   Additional history obtained:  External records from outside sources obtained and reviewed including prior ED visits and prior Inpatient records.    Lab Tests:  I ordered and personally interpreted labs.  The pertinent results include: CBC CMP UA lipase   Imaging Studies ordered:  I ordered imaging studies including CT abdomen pelvis I independently visualized and interpreted obtained imaging which showed no acute I agree with the radiologist interpretation.   Cardiac Monitoring:  The patient was maintained on a cardiac monitor.  I personally viewed and interpreted the cardiac monitor which showed an underlying rhythm of: Sinus rhythm   Medicines ordered:  I ordered medication including Ativan, Benadryl, Zofran, and Dilaudid for abdominal pain and nausea Reevaluation of the patient after these medicines showed that the patient: resolved    Problem List / ED Course:  Abdominal pain   Reevaluation:  After the interventions noted above, I reevaluated the patient and found that they have: resolved    Dispostion:  After consideration of the diagnostic results and the  patients response to treatment, I feel that the patent would benefit from close outpatient follow-up..          Final Clinical Impression(s) / ED Diagnoses Final diagnoses:  Generalized abdominal pain    Rx / DC Orders ED Discharge Orders          Ordered    dicyclomine (BENTYL) 20 MG tablet  2 times daily        11/08/21 1546              Valarie Merino, MD 11/08/21 1550

## 2021-11-24 ENCOUNTER — Emergency Department (HOSPITAL_COMMUNITY)
Admission: EM | Admit: 2021-11-24 | Discharge: 2021-11-24 | Disposition: A | Discharge status: Home / Self Care | Payer: Medicaid Other | Attending: Emergency Medicine | Admitting: Emergency Medicine

## 2021-11-24 ENCOUNTER — Encounter (HOSPITAL_COMMUNITY): Payer: Self-pay

## 2021-11-24 DIAGNOSIS — M5440 Lumbago with sciatica, unspecified side: Secondary | ICD-10-CM | POA: Insufficient documentation

## 2021-11-24 DIAGNOSIS — K921 Melena: Secondary | ICD-10-CM | POA: Diagnosis not present

## 2021-11-24 DIAGNOSIS — K529 Noninfective gastroenteritis and colitis, unspecified: Secondary | ICD-10-CM | POA: Diagnosis not present

## 2021-11-24 DIAGNOSIS — R1084 Generalized abdominal pain: Secondary | ICD-10-CM

## 2021-11-24 DIAGNOSIS — E119 Type 2 diabetes mellitus without complications: Secondary | ICD-10-CM | POA: Diagnosis not present

## 2021-11-24 DIAGNOSIS — G8929 Other chronic pain: Secondary | ICD-10-CM

## 2021-11-24 LAB — BASIC METABOLIC PANEL
Anion gap: 5 (ref 5–15)
BUN: 8 mg/dL (ref 6–20)
CO2: 25 mmol/L (ref 22–32)
Calcium: 8.3 mg/dL — ABNORMAL LOW (ref 8.9–10.3)
Chloride: 106 mmol/L (ref 98–111)
Creatinine, Ser: 0.58 mg/dL (ref 0.44–1.00)
GFR, Estimated: >60 mL/min (ref >60)
Glucose, Bld: 150 mg/dL — ABNORMAL HIGH (ref 70–99)
Potassium: 2.7 mmol/L — CL (ref 3.5–5.1)
Sodium: 136 mmol/L (ref 135–145)

## 2021-11-24 LAB — CBC
HCT: 33.3 % — ABNORMAL LOW (ref 36.0–46.0)
Hemoglobin: 11 g/dL — ABNORMAL LOW (ref 12.0–15.0)
MCH: 27.8 pg (ref 26.0–34.0)
MCHC: 33 g/dL (ref 30.0–36.0)
MCV: 84.1 fL (ref 80.0–100.0)
Platelets: 233 10*3/uL (ref 150–400)
RBC: 3.96 MIL/uL (ref 3.87–5.11)
RDW: 13.6 % (ref 11.5–15.5)
WBC: 4.4 10*3/uL (ref 4.0–10.5)
nRBC: 0 % (ref 0.0–0.2)

## 2021-11-24 LAB — I-STAT BETA HCG BLOOD, ED (MC, WL, AP ONLY): I-stat hCG, quantitative: <5 m[IU]/mL (ref <5)

## 2021-11-24 LAB — POC OCCULT BLOOD, ED: Fecal Occult Bld: NEGATIVE

## 2021-11-24 MED ORDER — LACTATED RINGERS IV BOLUS
1000.0000 mL | Freq: Once | INTRAVENOUS | Status: AC
  Administered 2021-11-24: 1000 mL via INTRAVENOUS

## 2021-11-24 MED ORDER — MORPHINE SULFATE (PF) 4 MG/ML IV SOLN
4.0000 mg | Freq: Once | INTRAVENOUS | Status: AC
  Administered 2021-11-24: 4 mg via INTRAVENOUS
  Filled 2021-11-24: qty 1

## 2021-11-24 MED ORDER — DIPHENHYDRAMINE HCL 50 MG/ML IJ SOLN
12.5000 mg | Freq: Once | INTRAMUSCULAR | Status: AC
Start: 2021-11-24 — End: 2021-11-24
  Administered 2021-11-24: 12.5 mg via INTRAVENOUS
  Filled 2021-11-24: qty 1

## 2021-11-24 MED ORDER — POTASSIUM CHLORIDE CRYS ER 20 MEQ PO TBCR: 40.0000 meq | EXTENDED_RELEASE_TABLET | Freq: Every day | ORAL | 0 refills | Status: DC

## 2021-11-24 MED ORDER — COLESTIPOL HCL 5 G PO GRAN
5.0000 g | GRANULES | Freq: Two times a day (BID) | ORAL | 0 refills | Status: DC
Start: 2021-11-24 — End: 2024-09-02

## 2021-11-24 MED ORDER — ONDANSETRON HCL 4 MG/2ML IJ SOLN
4.0000 mg | Freq: Once | INTRAMUSCULAR | Status: AC
  Administered 2021-11-24: 4 mg via INTRAVENOUS
  Filled 2021-11-24: qty 2

## 2021-11-24 MED ORDER — POTASSIUM CHLORIDE 10 MEQ/100ML IV SOLN
10.0000 meq | INTRAVENOUS | Status: AC
  Administered 2021-11-24 (×2): 10 meq via INTRAVENOUS
  Filled 2021-11-24 (×2): qty 100

## 2021-11-24 MED ORDER — POTASSIUM CHLORIDE CRYS ER 20 MEQ PO TBCR
40.0000 meq | EXTENDED_RELEASE_TABLET | Freq: Once | ORAL | Status: AC
  Administered 2021-11-24: 40 meq via ORAL
  Filled 2021-11-24: qty 2

## 2021-11-24 MED ORDER — OXYCODONE-ACETAMINOPHEN 5-325 MG PO TABS
2.0000 | ORAL_TABLET | Freq: Once | ORAL | Status: AC
  Administered 2021-11-24: 2 via ORAL
  Filled 2021-11-24: qty 2

## 2021-11-24 NOTE — Discharge Instructions (Addendum)
Your evaluated in the emergency room today for back pain and diarrhea.  You are given a couple doses of pain medication in the emergency room with some improvement in your pain.  I have also attached neurosurgery follow-up given you have herniated disc.  Recommend you follow-up with your primary care provider for potential pain management referral.  Continue taking your gabapentin and oxycodone you have on hand.  You did not have any concerning signs or symptoms from back point standpoint.  You mentioned you had bleeding episodes with your diarrhea.  Your hemoglobin today was a little low at 11.0.  I did discuss with your gastroenterologist regarding this who recommends you start a new medication which have sent to your pharmacy.  You can take this medication twice a day.  They will call and check on you tomorrow and schedule an outpatient lab appointment to have your labs checked.  If you have worsening symptoms such as worsening back pain, difficulty walking, difficulty controlling your bladder, or bowel, or fever, or worsening bleeding episodes please return to the emergency room.  You can take your first potassium supplement tonight.
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
## 2021-12-08 ENCOUNTER — Other Ambulatory Visit: Payer: Self-pay

## 2021-12-08 ENCOUNTER — Emergency Department (HOSPITAL_COMMUNITY)
Admission: EM | Admit: 2021-12-08 | Discharge: 2021-12-08 | Disposition: A | Discharge status: Home / Self Care | Payer: Medicaid Other | Attending: Emergency Medicine | Admitting: Emergency Medicine

## 2021-12-08 ENCOUNTER — Encounter (HOSPITAL_COMMUNITY): Payer: Self-pay | Admitting: Emergency Medicine

## 2021-12-08 DIAGNOSIS — E119 Type 2 diabetes mellitus without complications: Secondary | ICD-10-CM | POA: Insufficient documentation

## 2021-12-08 DIAGNOSIS — G8929 Other chronic pain: Secondary | ICD-10-CM | POA: Insufficient documentation

## 2021-12-08 DIAGNOSIS — Z794 Long term (current) use of insulin: Secondary | ICD-10-CM | POA: Insufficient documentation

## 2021-12-08 DIAGNOSIS — M545 Low back pain, unspecified: Secondary | ICD-10-CM | POA: Diagnosis not present

## 2021-12-08 MED ORDER — LIDOCAINE 5 % EX PTCH: 1.0000 | MEDICATED_PATCH | CUTANEOUS | 0 refills | Status: DC

## 2021-12-08 MED ORDER — MORPHINE SULFATE (PF) 4 MG/ML IV SOLN
4.0000 mg | Freq: Once | INTRAVENOUS | Status: AC
  Administered 2021-12-08: 4 mg via INTRAMUSCULAR
  Filled 2021-12-08: qty 1
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
## 2021-12-08 NOTE — Discharge Instructions (Signed)
Your history and exam are consistent with continued acute on chronic low back pain likely related to your bulging disks.  As your symptoms had exacerbated, we agreed to give a dose of intramuscular pain medicine and add some patches to your home regimen and ensure you can see your neurosurgeon tomorrow to develop a further plan.  With the continued GI symptoms, please follow-up with a GI doctor as recommended.  We had a shared decision-making conversation and agreed to hold on new imaging and blood work as your symptoms are similar to prior and you have the plan in place for further management.  If any symptoms are to change or worsen acutely, please return to the nearest emergency department.

## 2021-12-08 NOTE — ED Provider Notes (Signed)
[facility] DEPT Provider Note   CSN: [id number] Arrival date & time: 12/08/21  0906     History  Chief Complaint  Patient presents with   Back Pain    Jodi Mcgee is a 36 y.o. female.  The history is provided by the patient and medical records. No language interpreter was used.  Back Pain Location:  Lumbar spine Quality:  Aching Radiates to:  L posterior upper leg and R posterior upper leg Pain severity:  Severe Pain is:  Unable to specify Onset quality:  Gradual Duration:  2 months Timing:  Constant Progression:  Unchanged Chronicity:  Chronic Relieved by:  Nothing Worsened by:  Palpation and movement Ineffective treatments:  None tried Associated symptoms: leg pain   Associated symptoms: no abdominal pain, no abdominal swelling, no bladder incontinence, no bowel incontinence, no chest pain, no dysuria, no fever, no headaches, no numbness, no perianal numbness, no weakness and no weight loss       Home Medications Prior to Admission medications   Medication Sig Start Date End Date Taking? Authorizing Provider  amLODipine (NORVASC) 5 MG tablet Take 1 tablet (5 mg total) by mouth daily. 10/28/21   Nita Sells, MD  blood glucose meter kit and supplies KIT Dispense based on patient and insurance preference. Use up to four times daily as directed. (FOR ICD-9 250.00, 250.01). 02/21/19   Oretha Milch D, MD  colestipol (COLESTID) 5 g granules Take 5 g by mouth 2 (two) times daily. 11/24/21 12/24/21  Evlyn Courier, PA-C  dicyclomine (BENTYL) 20 MG tablet Take 1 tablet (20 mg total) by mouth 2 (two) times daily. 10/02/21   Azucena Cecil, PA-C  dicyclomine (BENTYL) 20 MG tablet Take 1 tablet (20 mg total) by mouth 2 (two) times daily. 11/08/21   Valarie Merino, MD  feeding supplement, GLUCERNA SHAKE, (GLUCERNA SHAKE) LIQD Take 237 mLs by mouth 3 (three) times daily between meals. 11/05/21   [name], [name], DO  HUMULIN R 100 UNIT/ML  injection Inject 20 Units into the skin 3 (three) times daily before meals. Per sliding scale 03/24/20   [provider]  Levonorgestrel (LILETTA) 19.5 MCG/DAY IUD IUD 1 each by Intrauterine route continuous. Placed in 2019    [provider]  nicotine (NICODERM CQ - DOSED IN MG/24 HR) 7 mg/24hr patch Place 1 patch (7 mg total) onto the skin daily. 10/28/21   Nita Sells, MD  ondansetron (ZOFRAN ODT) 4 MG disintegrating tablet 4m ODT q4 hours prn nausea/vomit Patient taking differently: Take 4 mg by mouth every 4 (four) hours as needed for nausea or vomiting. 09/04/21   Mesner, JCorene Cornea MD  oxyCODONE (OXY IR/ROXICODONE) 5 MG immediate release tablet Take 1 tablet (5 mg total) by mouth every 4 (four) hours as needed for severe pain. 10/28/21   SNita Sells MD  pantoprazole (PROTONIX) 20 MG tablet Take 1 tablet (20 mg total) by mouth daily. 10/02/21   GAzucena Cecil PA-C  potassium chloride SA (KLOR-CON M) 20 MEQ tablet Take 2 tablets (40 mEq total) by mouth daily for 7 days. 11/24/21 12/01/21  AEvlyn Courier PA-C  saccharomyces boulardii (FLORASTOR) 250 MG capsule Take 1 capsule (250 mg total) by mouth 2 (two) times daily. 11/05/21   [name], [name], DO      Allergies    Hydrocodone, Iodinated contrast media, Cephalexin, Cetirizine & related, Toradol [ketorolac tromethamine], Flagyl [metronidazole], and Nsaids    Review of Systems   Review of Systems  Constitutional:  Negative  for chills, diaphoresis, fatigue, fever and weight loss.  HENT:  Negative for congestion.   Respiratory:  Negative for cough, chest tightness, shortness of breath and wheezing.   Cardiovascular:  Negative for chest pain.  Gastrointestinal:  Positive for diarrhea. Negative for abdominal pain, bowel incontinence, constipation, nausea and vomiting.  Genitourinary:  Negative for bladder incontinence, dysuria and flank pain.  Musculoskeletal:  Positive for back pain. Negative for neck pain and neck  stiffness.  Skin:  Negative for rash and wound.  Neurological:  Negative for dizziness, weakness, light-headedness, numbness and headaches.  Psychiatric/Behavioral:  Negative for agitation and confusion.   All other systems reviewed and are negative.  Physical Exam Updated Vital Signs BP 111/76 (BP Location: Left Arm)    Pulse (!) 108    Temp 98 F (36.7 C) (Oral)    Resp 16    SpO2 96%  Physical Exam Vitals and nursing note reviewed.  Constitutional:      General: She is not in acute distress.    Appearance: She is well-developed. She is not ill-appearing, toxic-appearing or diaphoretic.  HENT:     Head: Normocephalic and atraumatic.     Nose: No congestion or rhinorrhea.     Mouth/Throat:     Mouth: Mucous membranes are moist.     Pharynx: No oropharyngeal exudate or posterior oropharyngeal erythema.  Eyes:     Extraocular Movements: Extraocular movements intact.     Conjunctiva/sclera: Conjunctivae normal.     Pupils: Pupils are equal, round, and reactive to light.  Cardiovascular:     Rate and Rhythm: Normal rate and regular rhythm.     Heart sounds: No murmur heard. Pulmonary:     Effort: Pulmonary effort is normal. No respiratory distress.     Breath sounds: Normal breath sounds. No wheezing, rhonchi or rales.  Chest:     Chest wall: No tenderness.  Abdominal:     General: Abdomen is flat.     Palpations: Abdomen is soft.     Tenderness: There is no abdominal tenderness. There is no right CVA tenderness, left CVA tenderness, guarding or rebound.  Musculoskeletal:        General: Tenderness present. No swelling.     Cervical back: Neck supple. No tenderness.     Thoracic back: No tenderness.     Lumbar back: Tenderness present.       Back:     Right lower leg: No edema.     Left lower leg: No edema.     Comments: Tenderness across lower back similar to prior.  No focal numbness, or weakness on lower extremity exam.  Good pulses.  Legs nontender.  Abdomen nontender.   Exam otherwise unremarkable.  Skin:    General: Skin is warm and dry.     Capillary Refill: Capillary refill takes less than 2 seconds.     Findings: No erythema or rash.  Neurological:     General: No focal deficit present.     Mental Status: She is alert.     Sensory: No sensory deficit.     Motor: No weakness.  Psychiatric:        Mood and Affect: Mood normal.    ED Results / Procedures / Treatments   Labs (all labs ordered are listed, but only abnormal results are displayed) Labs Reviewed - No data to display  EKG None  Radiology No results found.  Procedures Procedures    Medications Ordered in ED Medications  morphine (PF) 4 MG/ML  injection 4 mg (4 mg Intramuscular Given 12/08/21 0945)    ED Course/ Medical Decision Making/ A&P                           Medical Decision Making Risk Prescription drug management.    Jodi Mcgee is a 36 y.o. female with a past medical history significant for chronic low back pain with known lumbar disc bulges, previous small bowel obstruction, previous DVT and PE, diabetes, previous DKA, GERD, and previous rectal bleeding who presents with back pain.  She reports that she is scheduled to see her neurosurgeon for her back pain tomorrow but the pain was so bad today she cannot go to work and came here for evaluation.  She denies any new trauma or falls.  Denies any new numbness, weakness, incontinence, or new neurologic complaints.  She reports she chronically been having diarrhea with occasional blood in it that is unchanged.  She is scheduled to see her GI team in the next few weeks.  She reports no new fevers, chills, nausea, vomiting, lightheadedness, fatigue, chest pain, shortness breath, palpitations, or syncope.  He is primarily concerned about her back pain and as it is not improving despite home oxycodone and gabapentin.  She called her PCP 2 days ago who told her to increase her gabapentin which she has done.  It is starting  to do slightly better but she cannot go to work today.  On exam, lungs clear and chest nontender.  Abdomen nontender.  Normal bowel sounds.  Normal sensation and strength in all extremities.  Tenderness in her low back but nontender in her mid and upper back.  No neck tenderness.  Patient resting with some discomfort.  Had a shared decision conversation with patient at work-up.  As she is already had CTs and MRIs and is scheduled see her neurosurgeon tomorrow with no new trauma, she agrees to hold on new imaging today.  As she chronically has diarrhea with occasional blood and is seen GI with colonoscopy in the last few months and is scheduled to see them upcoming, she agrees to hold on further imaging of her abdomen pelvis or any blood work today as this is more chronic.  She is family concerned about getting some more relief from her back pain to let her go to her appointment tomorrow.  After the shared decision conversation, we agreed to give her 1 IM dose of pain medicine and add on some patches for her back and she will go to the neurosurgeon tomorrow.  She will also call to follow-up sooner with her GI team and possible as she had no other red flags for symptomatic anemia or other acute abdominal pathology given her lack of tenderness, normal bowel sounds on exam, and lack of any signs of obstruction.  Patient agrees with plan of care and was given a shot of pain medicine.  She will follow-up with the neurosurgery and GI teams and understood return precautions.  She no other questions or concerns and was discharged in good condition.         Final Clinical Impression(s) / ED Diagnoses Final diagnoses:  Chronic low back pain, unspecified back pain laterality, unspecified whether sciatica present    Rx / DC Orders ED Discharge Orders          Ordered    lidocaine (LIDODERM) 5 %  Every 24 hours        12/08/21 0944  Clinical Impression: 1. Chronic low back pain,  unspecified back pain laterality, unspecified whether sciatica present     Disposition: Discharge  Condition: Good  I have discussed the results, Dx and Tx plan with the pt(& family if present). He/she/they expressed understanding and agree(s) with the plan. Discharge instructions discussed at great length. Strict return precautions discussed and pt &/or family have verbalized understanding of the instructions. No further questions at time of discharge.    Discharge Medication List as of 12/08/2021  9:44 AM     START taking these medications   Details  lidocaine (LIDODERM) 5 % Place 1 patch onto the skin daily. Remove & Discard patch within 12 hours or as directed by MD, Starting Thu 12/08/2021, Print        Follow Up: your neurosurgery appointment tomorrow and your GI appointment coming up     [facility] [place] [id number] [place] [address] [phone]         Jacksen Isip, Gwenyth Allegra, MD 12/08/21 1022

## 2021-12-17 ENCOUNTER — Emergency Department (HOSPITAL_COMMUNITY): Payer: Medicaid Other

## 2021-12-17 ENCOUNTER — Other Ambulatory Visit: Payer: Self-pay

## 2021-12-17 ENCOUNTER — Encounter (HOSPITAL_COMMUNITY): Payer: Self-pay | Admitting: Emergency Medicine

## 2021-12-17 ENCOUNTER — Emergency Department (HOSPITAL_COMMUNITY)
Admission: EM | Admit: 2021-12-17 | Discharge: 2021-12-17 | Disposition: A | Discharge status: Home / Self Care | Payer: Medicaid Other | Attending: Emergency Medicine | Admitting: Emergency Medicine

## 2021-12-17 DIAGNOSIS — N3946 Mixed incontinence: Secondary | ICD-10-CM | POA: Insufficient documentation

## 2021-12-17 DIAGNOSIS — W010XXA Fall on same level from slipping, tripping and stumbling without subsequent striking against object, initial encounter: Secondary | ICD-10-CM | POA: Diagnosis not present

## 2021-12-17 DIAGNOSIS — Z794 Long term (current) use of insulin: Secondary | ICD-10-CM | POA: Insufficient documentation

## 2021-12-17 DIAGNOSIS — G8929 Other chronic pain: Secondary | ICD-10-CM | POA: Insufficient documentation

## 2021-12-17 DIAGNOSIS — M542 Cervicalgia: Secondary | ICD-10-CM | POA: Insufficient documentation

## 2021-12-17 DIAGNOSIS — M545 Low back pain, unspecified: Secondary | ICD-10-CM | POA: Diagnosis present

## 2021-12-17 DIAGNOSIS — M546 Pain in thoracic spine: Secondary | ICD-10-CM | POA: Diagnosis not present

## 2021-12-17 DIAGNOSIS — Y93E5 Activity, floor mopping and cleaning: Secondary | ICD-10-CM | POA: Insufficient documentation

## 2021-12-17 DIAGNOSIS — E119 Type 2 diabetes mellitus without complications: Secondary | ICD-10-CM | POA: Diagnosis not present

## 2021-12-17 DIAGNOSIS — N9489 Other specified conditions associated with female genital organs and menstrual cycle: Secondary | ICD-10-CM | POA: Insufficient documentation

## 2021-12-17 LAB — I-STAT BETA HCG BLOOD, ED (MC, WL, AP ONLY): I-stat hCG, quantitative: <5 m[IU]/mL (ref <5)

## 2021-12-17 MED ORDER — MORPHINE SULFATE (PF) 4 MG/ML IV SOLN
4.0000 mg | Freq: Once | INTRAVENOUS | Status: AC
  Administered 2021-12-17: 4 mg via INTRAVENOUS
  Filled 2021-12-17: qty 1

## 2021-12-17 MED ORDER — ONDANSETRON HCL 4 MG/2ML IJ SOLN
4.0000 mg | Freq: Once | INTRAMUSCULAR | Status: AC
  Administered 2021-12-17: 4 mg via INTRAVENOUS
  Filled 2021-12-17: qty 2

## 2021-12-17 MED ORDER — LIDOCAINE 5 % EX PTCH
1.0000 | MEDICATED_PATCH | CUTANEOUS | Status: DC
  Administered 2021-12-17: 1 via TRANSDERMAL
  Filled 2021-12-17: qty 1

## 2021-12-17 MED ORDER — DIPHENHYDRAMINE HCL 25 MG PO CAPS
25.0000 mg | ORAL_CAPSULE | Freq: Once | ORAL | Status: AC
  Administered 2021-12-17: 25 mg via ORAL
  Filled 2021-12-17: qty 1

## 2021-12-17 MED ORDER — HYDROMORPHONE HCL 1 MG/ML IJ SOLN
1.0000 mg | Freq: Once | INTRAMUSCULAR | Status: AC
  Administered 2021-12-17: 1 mg via INTRAVENOUS
  Filled 2021-12-17: qty 1

## 2021-12-17 NOTE — Discharge Instructions (Addendum)
You came to the emergency department to be evaluated for your back pain after suffering a fall.  The CT scan of your neck, thoracic, and lumbar back showed no acute abnormalities.  Please pick up the prescription for lidocaine patches.  Please continue to take your prescribed opiate pain medication.  Please follow-up with your primary care provider as needed if your pain does not improve.  I have additionally placed a referral to the neurosurgeon due to your disc protrusion on previous MRIs.  You should hear from their office in 2-3 business days about scheduling a follow-up appointment.  Get help right away if: You develop new bowel or bladder control problems. You have unusual weakness or numbness in your arms or legs. You feel faint.

## 2021-12-17 NOTE — ED Provider Notes (Signed)
[facility] DEPT Provider Note   CSN: [id number] Arrival date & time: 12/17/21  1247     History  Chief Complaint  Patient presents with   Back Pain    Jodi Mcgee is a 36 y.o. female with a past medical history of chronic low back pain with known lumbar disc bulges, diabetes mellitus, PE (not on anticoagulation), anxiety, GERD.  Presents to the emergency department for complaint of back pain.  Patient reports that she has been dealing with chronic low back pain for multiple months.  Patient has appointment to see [facility] health neurosurgery April 10.  Patient reports that yesterday she was cleaning her floor when she slipped and fell landing on her left side.  Patient denies any her head or any loss of consciousness.  Patient reports that she has had increased and worsening back pain since this fall.  Pain is located throughout her entire back.  Patient rates pain 10/10 on the pain scale.  Patient reports that she took her gabapentin with no improvement in her pain.  Pain is worse with touch and movement.  Patient denies any numbness or weakness.  Patient endorses difficulty ambulating due to her back pain.  Patient endorses bowel/bladder incontinence.  States that this has been present for multiple months and is unchanged since her fall yesterday.  Patient denies any fever, chills, numbness, weakness, saddle anesthesia, facial asymmetry, dysarthria, headache, IV drug use, history of cancer.   Back Pain Associated symptoms: no abdominal pain, no chest pain, no fever and no headaches       Home Medications Prior to Admission medications   Medication Sig Start Date End Date Taking? Authorizing Provider  amLODipine (NORVASC) 5 MG tablet Take 1 tablet (5 mg total) by mouth daily. 10/28/21   Nita Sells, MD  blood glucose meter kit and supplies KIT Dispense based on patient and insurance preference. Use up to four times daily as directed. (FOR  ICD-9 250.00, 250.01). 02/21/19   Oretha Milch D, MD  colestipol (COLESTID) 5 g granules Take 5 g by mouth 2 (two) times daily. 11/24/21 12/24/21  Evlyn Courier, PA-C  dicyclomine (BENTYL) 20 MG tablet Take 1 tablet (20 mg total) by mouth 2 (two) times daily. 10/02/21   Azucena Cecil, PA-C  dicyclomine (BENTYL) 20 MG tablet Take 1 tablet (20 mg total) by mouth 2 (two) times daily. 11/08/21   Valarie Merino, MD  feeding supplement, GLUCERNA SHAKE, (GLUCERNA SHAKE) LIQD Take 237 mLs by mouth 3 (three) times daily between meals. 11/05/21   [name], [name], DO  HUMULIN R 100 UNIT/ML injection Inject 20 Units into the skin 3 (three) times daily before meals. Per sliding scale 03/24/20   [provider]  Levonorgestrel (LILETTA) 19.5 MCG/DAY IUD IUD 1 each by Intrauterine route continuous. Placed in 2019    [provider]  lidocaine (LIDODERM) 5 % Place 1 patch onto the skin daily. Remove & Discard patch within 12 hours or as directed by MD 12/08/21   Tegeler, Gwenyth Allegra, MD  nicotine (NICODERM CQ - DOSED IN MG/24 HR) 7 mg/24hr patch Place 1 patch (7 mg total) onto the skin daily. 10/28/21   Nita Sells, MD  ondansetron (ZOFRAN ODT) 4 MG disintegrating tablet 4mg  ODT q4 hours prn nausea/vomit Patient taking differently: Take 4 mg by mouth every 4 (four) hours as needed for nausea or vomiting. 09/04/21   Mesner, Corene Cornea, MD  oxyCODONE (OXY IR/ROXICODONE) 5 MG immediate release tablet Take 1 tablet (  5 mg total) by mouth every 4 (four) hours as needed for severe pain. 10/28/21   Nita Sells, MD  pantoprazole (PROTONIX) 20 MG tablet Take 1 tablet (20 mg total) by mouth daily. 10/02/21   Azucena Cecil, PA-C  potassium chloride SA (KLOR-CON M) 20 MEQ tablet Take 2 tablets (40 mEq total) by mouth daily for 7 days. 11/24/21 12/01/21  Evlyn Courier, PA-C  saccharomyces boulardii (FLORASTOR) 250 MG capsule Take 1 capsule (250 mg total) by mouth 2 (two) times daily. 11/05/21   [name], [name], DO       Allergies    Hydrocodone, Iodinated contrast media, Cephalexin, Cetirizine & related, Toradol [ketorolac tromethamine], Flagyl [metronidazole], and Nsaids    Review of Systems   Review of Systems  Constitutional:  Negative for chills and fever.  HENT:  Negative for facial swelling.   Eyes:  Negative for visual disturbance.  Respiratory:  Negative for shortness of breath.   Cardiovascular:  Negative for chest pain.  Gastrointestinal:  Negative for abdominal pain, nausea and vomiting.  Genitourinary:  Negative for difficulty urinating.  Musculoskeletal:  Positive for back pain. Negative for neck pain.  Skin:  Negative for color change and rash.  Neurological:  Negative for dizziness, syncope, light-headedness and headaches.  Psychiatric/Behavioral:  Negative for confusion.    Physical Exam Updated Vital Signs BP (!) 135/92 (BP Location: Left Arm)    Pulse (!) 106    Temp 98 F (36.7 C) (Oral)    Resp 17    Ht $R'5\' 7"'DK$  (1.702 m)    Wt 70.5 kg    SpO2 100%    BMI 24.34 kg/m  Physical Exam Vitals and nursing note reviewed.  Constitutional:      General: She is not in acute distress.    Appearance: She is not ill-appearing, toxic-appearing or diaphoretic.     Comments: Appears uncomfortable due to complaints of pain  HENT:     Head: Normocephalic.  Eyes:     General: No scleral icterus.       Right eye: No discharge.        Left eye: No discharge.  Cardiovascular:     Rate and Rhythm: Normal rate.  Pulmonary:     Effort: Pulmonary effort is normal.  Musculoskeletal:     Cervical back: Tenderness and bony tenderness present. No swelling, edema, deformity, erythema, signs of trauma, lacerations, rigidity, spasms, torticollis or crepitus. Pain with movement present. Normal range of motion.     Thoracic back: Tenderness and bony tenderness present. No swelling, edema, deformity, signs of trauma, lacerations or spasms.     Lumbar back: Tenderness and bony tenderness present. No  swelling, edema, deformity, signs of trauma, lacerations or spasms.     Comments: No deformity or step-off to cervical, thoracic, or lumbar spine.  Patient has diffuse tenderness throughout her entire back.  Patient does have tenderness over midline from cervical to lumbar back.  Increased pain to left lumbar back.  Skin:    General: Skin is warm and dry.  Neurological:     General: No focal deficit present.     Mental Status: She is alert and oriented to person, place, and time.     GCS: GCS eye subscore is 4. GCS verbal subscore is 5. GCS motor subscore is 6.     Cranial Nerves: Cranial nerves 2-12 are intact. No cranial nerve deficit, dysarthria or facial asymmetry.     Sensory: Sensation is intact.     Motor: No  weakness, tremor, seizure activity or pronator drift.     Comments: CN II through XII intact.  +5 Strength to bilateral upper extremities.  Patient able to lift both legs and hold them against gravity in supine position.  +5 strength to bilateral plantar flexion and dorsiflexion.  Sensation to light touch grossly intact to bilateral upper and lower extremities.  Psychiatric:        Behavior: Behavior is cooperative.    ED Results / Procedures / Treatments   Labs (all labs ordered are listed, but only abnormal results are displayed) Labs Reviewed  I-STAT BETA HCG BLOOD, ED ([facility], [facility], [facility] ONLY)    EKG None  Radiology CT Cervical Spine Wo Contrast  Result Date: 12/17/2021 CLINICAL DATA:  Neck trauma, midline tenderness (Age 79-64y) EXAM: CT CERVICAL SPINE WITHOUT CONTRAST TECHNIQUE: Multidetector CT imaging of the cervical spine was performed without intravenous contrast. Multiplanar CT image reconstructions were also generated. RADIATION DOSE REDUCTION: This exam was performed according to the departmental dose-optimization program which includes automated exposure control, adjustment of the mA and/or kV according to patient size and/or use of iterative reconstruction technique.  COMPARISON:  None. FINDINGS: Alignment: Rotatory subluxation of C1 relative to C2. Facet joints are aligned without dislocation or traumatic listhesis. Smooth reversal of the cervical lordosis. Skull base and vertebrae: No acute fracture. No primary bone lesion or focal pathologic process. Soft tissues and spinal canal: No prevertebral fluid or swelling. No visible canal hematoma. Disc levels:  Unremarkable. Upper chest: Negative. Other: Borderline prominent bilateral anterior cervical chain lymph nodes the nonspecific. IMPRESSION: 1. No acute fracture or traumatic listhesis of the cervical spine. 2. Rotatory subluxation of C1 relative to C2, favored positional in the absence of a fixed torticollis. 3. Borderline prominent bilateral anterior cervical chain lymph nodes, nonspecific. Electronically Signed   By: Davina Poke D.O.   On: 12/17/2021 15:54   CT Lumbar Spine Wo Contrast  Result Date: 12/17/2021 CLINICAL DATA:  Back pain EXAM: CT LUMBAR SPINE WITHOUT CONTRAST TECHNIQUE: Multidetector CT imaging of the lumbar spine was performed without intravenous contrast administration. Multiplanar CT image reconstructions were also generated. RADIATION DOSE REDUCTION: This exam was performed according to the departmental dose-optimization program which includes automated exposure control, adjustment of the mA and/or kV according to patient size and/or use of iterative reconstruction technique. COMPARISON:  11/08/2021 FINDINGS: Segmentation: 5 lumbar type vertebrae. Alignment: Normal. Vertebrae: Vertebral body heights are maintained. No fracture. No focal pathologic process. Paraspinal and other soft tissues: Negative. Disc levels: Preserved disc heights. No evidence of focal disc protrusion. Normal facet joints. No evidence of foraminal or canal stenosis within the lumbar spine. IMPRESSION: Unremarkable CT of the lumbar spine. Electronically Signed   By: Davina Poke D.O.   On: 12/17/2021 15:56     Procedures Procedures    Medications Ordered in ED Medications  morphine (PF) 4 MG/ML injection 4 mg (has no administration in time range)  lidocaine (LIDODERM) 5 % 1 patch (has no administration in time range)  HYDROmorphone (DILAUDID) injection 1 mg (1 mg Intravenous Given 12/17/21 1353)  ondansetron (ZOFRAN) injection 4 mg (4 mg Intravenous Given 12/17/21 1352)  diphenhydrAMINE (BENADRYL) capsule 25 mg (25 mg Oral Given 12/17/21 1503)    ED Course/ Medical Decision Making/ A&P                           Medical Decision Making Amount and/or Complexity of Data Reviewed Radiology: ordered.  Risk Prescription  drug management.   Alert 36 year old female no acute stress, nontoxic-appearing.  Presents the emergency department with complaint of generalized back pain after suffering a fall.  Information was obtained from patient.  Past medical records were reviewed including previous provider notes, labs, and imaging.  Patient has past medical history as noted in HPI which complicates her care.  Per chart review patient was prescribed oxycodone on 12/12/21 for her chronic lumbar back pain.  Patient had MRI of the lumbar spine without IV contrast on 11/09/2021 which showed no significant neural foraminal narrowing.  Minimal disc protrusions at L4-L5 and L5-S1 which causes some minimal bilateral lateral recess narrowing.  Patient endorses bowel and bladder incontinence however states that this has been present for multiple months and unchanged since her fall yesterday.  No new numbness, weakness, saddle anesthesia.  Patient has sensation grossly intact to bilateral lower extremities and is able to hold both legs against gravity without difficulty.  MRI was considered however lower suspicion for cauda equina syndrome at this time.  Patient denies any fever, chills, or IV drug use, suspicion for epidural abscess at this time.  Due to midline tenderness from cervical to the lumbar spine after  traumatic injury will obtain CT imaging to evaluate for acute osseous abnormality.  Patient given Dilaudid for pain management.  Patient reports improvement in pain after receiving Dilaudid however pain progressively started to come back.  We will give patient additional dose of morphine as she is allergic to Toradol.  We will add on lidocaine patch as she has increased tenderness to left lumbar back.  CT imaging shows no acute osseous abnormalities.  Plan to ambulate patient in discharge.  Patient able to tolerate ambulation with nursing staff.  Patient has improvement in pain after receiving morphine.  Patient has prescription for lidocaine patch and hydrocodone.  Will place ambulatory referral to neurosurgery.  Patient advised to follow-up with her PCP.  Discussed results, findings, treatment and follow up. Patient advised of return precautions. Patient verbalized understanding and agreed with plan.         Final Clinical Impression(s) / ED Diagnoses Final diagnoses:  Chronic bilateral low back pain, unspecified whether sciatica present  Acute bilateral thoracic back pain  Acute neck pain    Rx / DC Orders ED Discharge Orders     None         Dyann Ruddle 12/17/21 1800    Carmin Muskrat, MD 12/17/21 [phone]

## 2021-12-17 NOTE — ED Notes (Signed)
Patient ambulated in the room with no difficulties

## 2021-12-17 NOTE — ED Notes (Signed)
PT in CT SCAN

## 2021-12-17 NOTE — ED Triage Notes (Signed)
BIB EMS from home, complains of chronic lower back pain, reports mopping yesterday, slipping and falling. Did not hit head, denies neck or back pain.

## 2021-12-29 ENCOUNTER — Other Ambulatory Visit: Payer: Self-pay

## 2021-12-29 ENCOUNTER — Encounter (HOSPITAL_BASED_OUTPATIENT_CLINIC_OR_DEPARTMENT_OTHER): Payer: Self-pay | Admitting: Emergency Medicine

## 2021-12-29 ENCOUNTER — Emergency Department (HOSPITAL_BASED_OUTPATIENT_CLINIC_OR_DEPARTMENT_OTHER)
Admission: EM | Admit: 2021-12-29 | Discharge: 2021-12-29 | Disposition: A | Discharge status: Home / Self Care | Payer: Medicaid Other | Attending: Emergency Medicine | Admitting: Emergency Medicine

## 2021-12-29 DIAGNOSIS — B9689 Other specified bacterial agents as the cause of diseases classified elsewhere: Secondary | ICD-10-CM | POA: Insufficient documentation

## 2021-12-29 DIAGNOSIS — N390 Urinary tract infection, site not specified: Secondary | ICD-10-CM | POA: Insufficient documentation

## 2021-12-29 DIAGNOSIS — Z794 Long term (current) use of insulin: Secondary | ICD-10-CM | POA: Insufficient documentation

## 2021-12-29 DIAGNOSIS — M549 Dorsalgia, unspecified: Secondary | ICD-10-CM | POA: Diagnosis present

## 2021-12-29 LAB — URINALYSIS, ROUTINE W REFLEX MICROSCOPIC
Bilirubin Urine: NEGATIVE
Glucose, UA: NEGATIVE mg/dL
Hgb urine dipstick: NEGATIVE
Nitrite: POSITIVE — AB
Protein, ur: 30 mg/dL — AB
Specific Gravity, Urine: 1.03 (ref 1.005–1.030)
pH: 6 (ref 5.0–8.0)

## 2021-12-29 MED ORDER — NITROFURANTOIN MONOHYD MACRO 100 MG PO CAPS: 100.0000 mg | ORAL_CAPSULE | Freq: Two times a day (BID) | ORAL | 0 refills | Status: DC

## 2021-12-29 MED ORDER — NITROFURANTOIN MONOHYD MACRO 100 MG PO CAPS
100.0000 mg | ORAL_CAPSULE | Freq: Once | ORAL | Status: AC
  Administered 2021-12-29: 100 mg via ORAL
  Filled 2021-12-29: qty 1

## 2021-12-29 NOTE — ED Provider Notes (Signed)
[facility] EMERGENCY DEPT Provider Note   CSN: [id number] Arrival date & time: 12/29/21  1814     History  Chief Complaint  Patient presents with   Back Pain    Jodi Mcgee is a 36 y.o. female seen in the emergency department complaint of back pain.  The patient is sleeping when I walk into the room, will not wake up and have a full conversation with me.  She says "my back still hurts".  When asked her if this is the same back pain she had on her prior ED evaluation a week ago she reports that it is.  She would not tell me what medication she has taken for it at home, or why she is returning to the ER today.  When asked if she has urinary pressure she says that she might.  Later on my reassessment the patient reports that she is still having back pain that has been radiating down her legs, sharp pains.  She takes gabapentin and oxycodone through her PCP and motrin, but it does not help her enough.  HPI     Home Medications Prior to Admission medications   Medication Sig Start Date End Date Taking? Authorizing Provider  nitrofurantoin, macrocrystal-monohydrate, (MACROBID) 100 MG capsule Take 1 capsule (100 mg total) by mouth 2 (two) times daily for 5 days. 12/29/21 01/03/22 Yes Terryn Rosenkranz, Carola Rhine, MD  amLODipine (NORVASC) 5 MG tablet Take 1 tablet (5 mg total) by mouth daily. 10/28/21   Nita Sells, MD  blood glucose meter kit and supplies KIT Dispense based on patient and insurance preference. Use up to four times daily as directed. (FOR ICD-9 250.00, 250.01). 02/21/19   Oretha Milch D, MD  colestipol (COLESTID) 5 g granules Take 5 g by mouth 2 (two) times daily. 11/24/21 12/24/21  Evlyn Courier, PA-C  dicyclomine (BENTYL) 20 MG tablet Take 1 tablet (20 mg total) by mouth 2 (two) times daily. 10/02/21   Azucena Cecil, PA-C  dicyclomine (BENTYL) 20 MG tablet Take 1 tablet (20 mg total) by mouth 2 (two) times daily. 11/08/21   Valarie Merino, MD  feeding  supplement, GLUCERNA SHAKE, (GLUCERNA SHAKE) LIQD Take 237 mLs by mouth 3 (three) times daily between meals. 11/05/21   [name], [name], DO  HUMULIN R 100 UNIT/ML injection Inject 20 Units into the skin 3 (three) times daily before meals. Per sliding scale 03/24/20   [provider]  Levonorgestrel (LILETTA) 19.5 MCG/DAY IUD IUD 1 each by Intrauterine route continuous. Placed in 2019    [provider]  lidocaine (LIDODERM) 5 % Place 1 patch onto the skin daily. Remove & Discard patch within 12 hours or as directed by MD 12/08/21   Tegeler, Gwenyth Allegra, MD  nicotine (NICODERM CQ - DOSED IN MG/24 HR) 7 mg/24hr patch Place 1 patch (7 mg total) onto the skin daily. 10/28/21   Nita Sells, MD  ondansetron (ZOFRAN ODT) 4 MG disintegrating tablet 61m ODT q4 hours prn nausea/vomit Patient taking differently: Take 4 mg by mouth every 4 (four) hours as needed for nausea or vomiting. 09/04/21   Mesner, JCorene Cornea MD  oxyCODONE (OXY IR/ROXICODONE) 5 MG immediate release tablet Take 1 tablet (5 mg total) by mouth every 4 (four) hours as needed for severe pain. 10/28/21   SNita Sells MD  pantoprazole (PROTONIX) 20 MG tablet Take 1 tablet (20 mg total) by mouth daily. 10/02/21   GAzucena Cecil PA-C  potassium chloride SA (KLOR-CON M) 20 MEQ tablet  Take 2 tablets (40 mEq total) by mouth daily for 7 days. 11/24/21 12/01/21  Evlyn Courier, PA-C  saccharomyces boulardii (FLORASTOR) 250 MG capsule Take 1 capsule (250 mg total) by mouth 2 (two) times daily. 11/05/21   [name], [name], DO      Allergies    Hydrocodone, Iodinated contrast media, Cephalexin, Cetirizine & related, Toradol [ketorolac tromethamine], Flagyl [metronidazole], and Nsaids    Review of Systems   Review of Systems  Physical Exam Updated Vital Signs BP (!) 134/94    Pulse (!) 103    Temp 98.4 F (36.9 C)    Resp 18    Ht _0  (1.702 m)    Wt 70.5 kg    SpO2 99%    BMI 24.34 kg/m  Physical Exam Constitutional:       General: She is not in acute distress.    Comments: Sleeping comfortably when I arrived in the room, somnolent  HENT:     Head: Normocephalic and atraumatic.  Eyes:     Conjunctiva/sclera: Conjunctivae normal.     Pupils: Pupils are equal, round, and reactive to light.  Cardiovascular:     Rate and Rhythm: Normal rate and regular rhythm.  Pulmonary:     Effort: Pulmonary effort is normal. No respiratory distress.  Musculoskeletal:     Comments: Reports diffuse trigger point tenderness of the upper and lower back, all muscle groups.  Skin:    General: Skin is warm and dry.  Neurological:     General: No focal deficit present.     Mental Status: She is alert. Mental status is at baseline.    ED Results / Procedures / Treatments   Labs (all labs ordered are listed, but only abnormal results are displayed) Labs Reviewed  URINALYSIS, ROUTINE W REFLEX MICROSCOPIC - Abnormal; Notable for the following components:      Result Value   APPearance HAZY (*)    Ketones, ur TRACE (*)    Protein, ur 30 (*)    Nitrite POSITIVE (*)    Leukocytes,Ua TRACE (*)    Bacteria, UA MANY (*)    All other components within normal limits  URINE CULTURE    EKG None  Radiology No results found.  Procedures Procedures    Medications Ordered in ED Medications  nitrofurantoin (macrocrystal-monohydrate) (MACROBID) capsule 100 mg (100 mg Oral Given 12/29/21 2332)    ED Course/ Medical Decision Making/ A&P Clinical Course as of 12/29/21 2335  Thu Dec 29, 2021  2315 On my reassessment the patient again was sleeping heavily, difficult to arouse.  Subsequently we had a discussion about her ongoing back pain, which at this point she has been seen over 3 months in the ER for and therefore I suspect would be chronic.  She is already taking gabapentin and opioids through her PCP and was upset when I suggested ibuprofen and tylenol.  I told her there is nothing further I can offer emergently for this back  pain through the emergency department.  Advised that she take the antibiotics for UTI as prescribed.  She is stable for discharge [MT]    Clinical Course User Index [MT] Eliannah Hinde, Carola Rhine, MD                           Medical Decision Making Amount and/or Complexity of Data Reviewed Labs: ordered.  Risk Prescription drug management.   Patient is here with complaint of back pain.  Per my  review of external records she has been seen nearly dozen times in our ER another surrounding hospital emergency department complaint of back pain, as well as outpatient visits for chronic back pain.  On her most recent visit to the ED she had CT scan of the spine which not show any acute fractures.  She does have a history of urine infections and I think is reasonable to check a urine if she will provide it.    Unfortunately she will not provide me with much further history.  She was repeatedly dozing when I went back in the room to reassess her twice, did wake up on the second attempt for a conversation, and expressed frustration that she was not getting "answers" and treatment for her back pain.  I explained to her the role and function of the emergency department to rule out medical emergencies, and I am not discounting her pain. She needs to follow-up with her primary care provider for this as she has been seen 15 times in the ED in the past 6 months for pain related complaints.  I do not see evidence of sepsis and do not think that this is altered mental status.  I have a lower suspicion for acute intra-abdominal emergency, but not that she does have a history of colitis which was treated in the past.  Colitis is not consistent with the back pain that she describes, which is most likely radiculopathy.  The patient reports to me "I have bulging disks in my back".  I reviewed her external records including her most recent positive urine cultures which were susceptible to Macrobid.        Final  Clinical Impression(s) / ED Diagnoses Final diagnoses:  Urinary tract infection without hematuria, site unspecified    Rx / DC Orders ED Discharge Orders          Ordered    nitrofurantoin, macrocrystal-monohydrate, (MACROBID) 100 MG capsule  2 times daily        12/29/21 2315              Wyvonnia Dusky, MD 12/29/21 [phone]

## 2021-12-29 NOTE — ED Triage Notes (Signed)
Pt arrives to ED with c/o acute on chronic lower back pain. She reports her pain worsened one week ago, no recent injury to her back. She was seen in ED on 2/25 for same and had extensive workup. Pt describes the pain as shooting/stabbing. No numbness or tingling in legs or feet. Alleviating factors include heating pads.  ?

## 2021-12-29 NOTE — Discharge Instructions (Addendum)
You will need to reach out to your primary care doctor's office about your back pain and how to manage those symptoms. ? ?You were diagnosed with a urinary tract infection today.  I prescribed you 5 days of antibiotics.  It is important that he continue taking his antibiotics as prescribed ? ?

## 2021-12-29 NOTE — ED Notes (Addendum)
Pt sleeping in bed. Was awaken by this RN to get urine sample by shaking her arm. Pt awaken but returned to sleep. Attempted to wake her once again and told her the importance of getting this urine since she has been in the ED for a long time. Pt awake. RN standing at bedside waiting pt to get up. Pt asks why RN is standing there. Pt informed RN was here to help her go to the bathroom at this pt became agitated stating she didn't need help and did not like RN's attitude. RN informed pt it was still important to get urine and another clinician would be asked to assist. Primary RN Caryl Pina and NT Devin at bedside to assist pt.  ?

## 2021-12-30 NOTE — ED Provider Notes (Signed)
Patient defecated on the floor of her room in what appears to be an intentional manner prior to leaving for her discharge.  Future providers should be aware that patient may require supervised police or security escort out of the premises. ?  ?Jodi Dusky, MD ?12/30/21 0002 ? ?

## 2022-01-01 ENCOUNTER — Encounter (HOSPITAL_BASED_OUTPATIENT_CLINIC_OR_DEPARTMENT_OTHER): Payer: Self-pay | Admitting: Emergency Medicine

## 2022-01-01 ENCOUNTER — Other Ambulatory Visit: Payer: Self-pay

## 2022-01-01 ENCOUNTER — Encounter (HOSPITAL_COMMUNITY): Payer: Self-pay | Admitting: *Deleted

## 2022-01-01 ENCOUNTER — Emergency Department (HOSPITAL_COMMUNITY)
Admission: EM | Admit: 2022-01-01 | Discharge: 2022-01-01 | Disposition: A | Discharge status: Home / Self Care | Payer: Medicaid Other | Source: Home / Self Care | Attending: Emergency Medicine | Admitting: Emergency Medicine

## 2022-01-01 ENCOUNTER — Emergency Department (HOSPITAL_BASED_OUTPATIENT_CLINIC_OR_DEPARTMENT_OTHER)
Admission: EM | Admit: 2022-01-01 | Discharge: 2022-01-01 | Disposition: A | Discharge status: Home / Self Care | Payer: Medicaid Other | Attending: Emergency Medicine | Admitting: Emergency Medicine

## 2022-01-01 DIAGNOSIS — M5442 Lumbago with sciatica, left side: Secondary | ICD-10-CM | POA: Diagnosis not present

## 2022-01-01 DIAGNOSIS — Z79899 Other long term (current) drug therapy: Secondary | ICD-10-CM | POA: Insufficient documentation

## 2022-01-01 DIAGNOSIS — M5441 Lumbago with sciatica, right side: Secondary | ICD-10-CM | POA: Diagnosis not present

## 2022-01-01 DIAGNOSIS — Z765 Malingerer [conscious simulation]: Secondary | ICD-10-CM

## 2022-01-01 DIAGNOSIS — M545 Low back pain, unspecified: Secondary | ICD-10-CM | POA: Diagnosis present

## 2022-01-01 DIAGNOSIS — R202 Paresthesia of skin: Secondary | ICD-10-CM | POA: Insufficient documentation

## 2022-01-01 DIAGNOSIS — N390 Urinary tract infection, site not specified: Secondary | ICD-10-CM | POA: Diagnosis not present

## 2022-01-01 DIAGNOSIS — F17210 Nicotine dependence, cigarettes, uncomplicated: Secondary | ICD-10-CM | POA: Diagnosis not present

## 2022-01-01 DIAGNOSIS — G8929 Other chronic pain: Secondary | ICD-10-CM | POA: Insufficient documentation

## 2022-01-01 DIAGNOSIS — E119 Type 2 diabetes mellitus without complications: Secondary | ICD-10-CM | POA: Insufficient documentation

## 2022-01-01 LAB — URINE CULTURE: Culture: >=100000 — AB

## 2022-01-01 MED ORDER — FOSFOMYCIN TROMETHAMINE 3 G PO PACK
3.0000 g | PACK | Freq: Once | ORAL | Status: AC
  Administered 2022-01-01: 3 g via ORAL
  Filled 2022-01-01: qty 3

## 2022-01-01 MED ORDER — HYDROMORPHONE HCL 1 MG/ML IJ SOLN: 2.0000 mg | Freq: Once | INTRAMUSCULAR | Status: DC

## 2022-01-01 NOTE — ED Notes (Signed)
Patient requesting to go home without receiving the dilaudid.  Patient discharged to home.  All discharge instructions reviewed.  Patient verbalized understanding via teachback method.  VS WDL.  Respirations even and unlabored.  Ambulatory out of ED without gait disturbance.  After insisting she did not drive herself to the emergency department (was dropped off), patient found to have parked her car outside a significant distance from main entrance and was able to drive herself home.   ?

## 2022-01-01 NOTE — ED Notes (Signed)
Patient ambulatory to restroom with steady gait and no assistance.  °

## 2022-01-01 NOTE — ED Triage Notes (Signed)
Reports acute on chronic lower back pain "down to my butt", and states she is having trouble controlling her bowels ("two weeks"). She states her PCP has prescribed her gabapentin and "pain pills" but states they arent helping and "my feet are feeling numb." Pt ambulatory to treatment room.  ?

## 2022-01-01 NOTE — ED Notes (Signed)
MD at bedside. 

## 2022-01-01 NOTE — Discharge Instructions (Signed)
You were evaluated in the Emergency Department and after careful evaluation, we did not find any emergent condition requiring admission or further testing in the hospital. ? ?Your exam/testing today was overall reassuring.  Recommend follow-up with your regular prescribers to discuss your pain control.  You have been ordered an outpatient MRI. ? ?Please return to the Emergency Department if you experience any worsening of your condition.  Thank you for allowing [place] to be a part of your care. ? ?

## 2022-01-01 NOTE — ED Notes (Addendum)
Entered patient's room and attempted to discharge her. While attempting to discuss patient's discharge paperwork, patient began talking over this writer, and began to be argumentative about staff's understanding of her situation. This writer attempted to discuss patient's concerns, but she continued to state, "you don't understand," while tearful. This RN told patient I was attempting to understand her situation. Patient ignored question, and started to repeat "I can't walk." Informed patient she is able to walk, multiple staff have seen patient ambulate well. This RN asked patient what she would like from the ED, since she has been evaluated multiple times and continues to seek treatment, although seeing her doctor 2 days prior (per those notes, patient states she has had adequate management of her symptoms). Patient became upset at this statement and began to raise her voice and argue with the nurse. Patient exclaimed, "he didn't even offer a heat pack!" Patient was offered multiple non-pharmacological pain management options and multiple non-narcotic pain medication option, which she declined. Discussed with patient that we could get her a heat pack and wheelchair so she did not have to walk to the door. Patient continued to argue with this RN. Security called to bedside to escort patient out of department.  ?

## 2022-01-01 NOTE — ED Triage Notes (Signed)
Pt arrives independently ambulatory to ED treatment room with c/o mid back pain that goes through her hips. Numbness on the bottom part of her feet. Reports she cannot control her bowels.  ?

## 2022-01-01 NOTE — ED Provider Notes (Signed)
?[place] DEPT ?[facility] Emergency Department ?Provider Note ?MRN:  [id number]  ?Arrival date & time: 01/01/22    ? ?Chief Complaint   ?Back Pain ?  ?History of Present Illness   ?Jodi Mcgee is a 36 y.o. year-old female with a history of diabetes presenting to the ED with chief complaint of back pain. ? ?Back pain for several months, getting worse.  Endorsing bowel incontinence for a few weeks.  Paresthesias to bilateral feet.  Denies fever. ? ?Review of Systems  ?A thorough review of systems was obtained and all systems are negative except as noted in the HPI and PMH.  ? ?Patient's Health History   ? ?Past Medical History:  ?Diagnosis Date  ? Anemia   ? Anxiety   ? Blood transfusion without reported diagnosis   ? both CS  ? Cocaine abuse ([place])   ? Diabetes mellitus without complication ([place])   ? GERD (gastroesophageal reflux disease)   ? has resolved  ? IBS (irritable bowel syndrome)   ? Ovarian cyst   ? Peritonitis, acute generalized ([place])   ? Pulmonary embolism ([place])   ? SBO (small bowel obstruction) ([place])   ? Sepsis ([place])   ?  ?Past Surgical History:  ?Procedure Laterality Date  ? APPENDECTOMY    ? ruptured   ? BIOPSY  10/27/2021  ? Procedure: BIOPSY;  Surgeon: Wilford Corner, MD;  Location: [facility];  Service: Endoscopy;;  ? BUNIONECTOMY    ? CESAREAN SECTION    ? CESAREAN SECTION Bilateral 11/20/2017  ? Procedure: CESAREAN SECTION;  Surgeon: Sloan Leiter, MD;  Location: [place];  Service: Obstetrics;  Laterality: Bilateral;  ? COLONOSCOPY WITH PROPOFOL N/A 10/27/2021  ? Procedure: COLONOSCOPY WITH PROPOFOL;  Surgeon: Wilford Corner, MD;  Location: [facility];  Service: Endoscopy;  Laterality: N/A;  ? OVARIAN CYST DRAINAGE    ? SMALL BOWEL REPAIR    ?  ?Family History  ?Problem Relation Age of Onset  ? Hypertension Mother   ? Anemia Mother   ? Diabetes Father   ?  ?Social History  ? ?Socioeconomic History  ? Marital status: Single  ?  Spouse name: Not on file  ? Number  of children: Not on file  ? Years of education: Not on file  ? Highest education level: Not on file  ?Occupational History  ? Not on file  ?Tobacco Use  ? Smoking status: Every Day  ?  Packs/day: 0.15  ?  Types: Cigarettes  ? Smokeless tobacco: Never  ? Tobacco comments:  ?  social use with tobacco  ?Vaping Use  ? Vaping Use: Former  ?Substance and Sexual Activity  ? Alcohol use: Yes  ?  Comment: occ  ? Drug use: Not Currently  ? Sexual activity: Yes  ?  Birth control/protection: I.U.D.  ?Other Topics Concern  ? Not on file  ?Social History Narrative  ? ** Merged History Encounter **  ?    ? ?Social Determinants of Health  ? ?Financial Resource Strain: Not on file  ?Food Insecurity: Not on file  ?Transportation Needs: Not on file  ?Physical Activity: Not on file  ?Stress: Not on file  ?Social Connections: Not on file  ?Intimate Partner Violence: Not on file  ?  ? ?Physical Exam  ? ?Vitals:  ? 01/01/22 0536  ?BP: (!) 139/108  ?Pulse: (!) 103  ?Resp: 16  ?Temp: 97.8 ?F (36.6 ?C)  ?SpO2: 100%  ?  ?CONSTITUTIONAL: Well-appearing, NAD ?NEURO/PSYCH:  Alert and oriented  x 3, normal and symmetric strength, largely normal gait ?EYES:  eyes equal and reactive ?ENT/NECK:  no LAD, no JVD ?CARDIO: Regular rate, well-perfused, normal S1 and S2 ?PULM:  CTAB no wheezing or rhonchi ?GI/GU:  non-distended, non-tender ?MSK/SPINE:  No gross deformities, no edema ?SKIN:  no rash, atraumatic ? ? ?*Additional and/or pertinent findings included in MDM below ? ?Diagnostic and Interventional Summary  ? ? EKG Interpretation ? ?Date/Time:    ?Ventricular Rate:    ?PR Interval:    ?QRS Duration:   ?QT Interval:    ?QTC Calculation:   ?R Axis:     ?Text Interpretation:   ?  ? ?  ? ?Labs Reviewed - No data to display  ?No orders to display  ?  ?Medications - No data to display  ? ?Procedures  /  Critical Care ?Procedures ? ?ED Course and Medical Decision Making  ?Initial Impression and Ddx ?Chronic back pain with report of bowel incontinence.   Seen by ED provider a few hours ago with high suspicion for drug-seeking behavior.  Patient was seen ambulating without any signs of pain or distress but then when caregivers are around her she expresses severe pain.  Also had a recent ED visit during which she seemingly intentionally defecated on the floor.  I witnessed her ambulate here in the emergency department without any signs of weakness.  Does not make sense for her to be able to walk in this fashion but also have bowel incontinence for weeks.  Highly doubt cauda equina or other emergent process.  Patient has been ordered an outpatient MRI.  She is upset when she is informed that she will not be receiving narcotics.  She is not interested in Tylenol or Motrin or numbing patches or any other form of pain management.  No emergent process, appropriate for discharge. ? ?Past medical/surgical history that increases complexity of ED encounter: None ? ?Interpretation of Diagnostics ?Not applicable ? ?Patient Reassessment and Ultimate Disposition/Management ?Discharge home ? ?Patient management required discussion with the following services or consulting groups:  None ? ?Complexity of Problems Addressed ?Chronic illness with exacerbation ? ?Additional Data Reviewed and Analyzed ?Further history obtained from: ?Prior ED visit notes and Care Everywhere ? ?Additional Factors Impacting ED Encounter Risk ?None ? ?Barth Kirks. Sedonia Small, MD ?[facility] Emergency Medicine ?[email or web address] ? ?Final Clinical Impressions(s) / ED Diagnoses  ? ?  ICD-10-CM   ?1. Chronic low back pain, unspecified back pain laterality, unspecified whether sciatica present  M54.50   ? G89.29   ?  ?  ?ED Discharge Orders   ? ? None  ? ?  ?  ? ?Discharge Instructions Discussed with and Provided to Patient:  ? ? ?Discharge Instructions   ? ?  ?You were evaluated in the Emergency Department and after careful evaluation, we did not find any emergent condition requiring  admission or further testing in the hospital. ? ?Your exam/testing today was overall reassuring.  Recommend follow-up with your regular prescribers to discuss your pain control.  You have been ordered an outpatient MRI. ? ?Please return to the Emergency Department if you experience any worsening of your condition.  Thank you for allowing [place] to be a part of your care. ? ? ? ? ?  ?Maudie Flakes, MD ?01/01/22 [id number] ? ?

## 2022-01-01 NOTE — ED Notes (Signed)
Patient ambulatory to bathroom without gait disturbance.   ?

## 2022-01-01 NOTE — ED Notes (Signed)
Patient lying in bed with snoring respirations.  Arousable to voice, but selective in answering questions asked.  Patient seems to be intermittently ignoring questions asked by this nurse.  No individuals have come to the [facility]center to take the patient home.  When asked if someone is on the way, she states they are still on the way but unable to give an ETA.   ?

## 2022-01-01 NOTE — ED Notes (Addendum)
Patient has order for dilaudid IM.  Per ED provider, she can have the medication if she has a ride to take her home and we can ensure she is not driving.  Patient instructed to call someone for a ride home, which she agrees to do.  Patient insists she did not drive herself to the medcenter, but rather was dropped off. ?

## 2022-01-01 NOTE — ED Notes (Signed)
Received care of patient resting in bed.  0 s/s acute distress.  Respirations even and unlabored.  Chaperoned ED assessment of patient.  Patient noted to smell heavily of marijuana.  ?

## 2022-01-01 NOTE — ED Provider Notes (Addendum)
[place] DEPT [facility] Provider Note: [name], MD, FACEP  CSN: [id number] MRN: [id number] ARRIVAL: 01/01/22 at [place]: [place]  01/01/22 12:40 AM Jodi Mcgee is a 36 y.o. female with chronic back pain already on gabapentin and oxycodone prescribed by her PCP.  She receives 42 oxycodone/APAP 5/325 tablets weekly.  She has a history of frequent visits to the ED for back pain, most recently 12/29/2021.  At that time she was told that she is already on pain management and would not be receiving an additional narcotics.  She did provide a urine specimen which suggested a urinary tract infection and urine culture grew out greater than 100,000 colonies per milliliter of gram-negative rods; identification and susceptibilities are pending.  She was prescribed Macrobid which she did not get filled.  She is here stating that her chronic back pain has worsened over the past 2 weeks and is now radiating down the the back of both buttocks and legs.  She has also had some trouble controlling her bowels for the past 2 weeks.  She denies saddle anesthesia but is having numbness to the bottom of her feet.  She is able to ambulate without difficulty.  She rates her pain as a 10 out of 10, worse with movement.  She states her medications are not working.  She does not take NSAIDs regularly due to stomach upset.  Past Medical History:  Diagnosis Date   Anemia    Anxiety    Blood transfusion without reported diagnosis    both CS   Cocaine abuse ([place])    Diabetes mellitus without complication (H[facility])    GERD (gastroesophageal reflux disease)    has resolved   IBS (irritable bowel syndrome)    Ovarian cyst    Peritonitis, acute generalized ([place])    Pulmonary embolism ([facility])    SBO (small bowel obstruction) ([place])    Sepsis ([place])     Past Surgical History:  Procedure Laterality Date   APPENDECTOMY     ruptured    BIOPSY  10/27/2021    Procedure: BIOPSY;  Surgeon: Wilford Corner, MD;  [facility];  Service: Endoscopy;;   BUNIONECTOMY     CESAREAN SECTION     CESAREAN SECTION Bilateral 11/20/2017   Procedure: CESAREAN SECTION;  Surgeon: Sloan Leiter, MD;  Location: [place];  Service: Obstetrics;  Laterality: Bilateral;   COLONOSCOPY WITH PROPOFOL N/A 10/27/2021   Procedure: COLONOSCOPY WITH PROPOFOL;  Surgeon: Wilford Corner, MD;  [facility];  Service: Endoscopy;  Laterality: N/A;   OVARIAN CYST DRAINAGE     SMALL BOWEL REPAIR      Family History  Problem Relation Age of Onset   Hypertension Mother    Anemia Mother    Diabetes Father     Social History   Tobacco Use   Smoking status: Every Day    Packs/day: 0.15    Types: Cigarettes   Smokeless tobacco: Never   Tobacco comments:    social use with tobacco  Vaping Use   Vaping Use: Former  Substance Use Topics   Alcohol use: Yes    Comment: occ   Drug use: Not Currently    Prior to Admission medications   Medication Sig Start Date End Date Taking? Authorizing Provider  amLODipine (NORVASC) 5 MG tablet Take 1 tablet (5 mg total) by mouth daily. 10/28/21   Nita Sells, MD  blood glucose  meter kit and supplies KIT Dispense based on patient and insurance preference. Use up to four times daily as directed. (FOR ICD-9 250.00, 250.01). 02/21/19   Oretha Milch D, MD  colestipol (COLESTID) 5 g granules Take 5 g by mouth 2 (two) times daily. 11/24/21 12/24/21  Evlyn Courier, PA-C  dicyclomine (BENTYL) 20 MG tablet Take 1 tablet (20 mg total) by mouth 2 (two) times daily. 10/02/21   Azucena Cecil, PA-C  dicyclomine (BENTYL) 20 MG tablet Take 1 tablet (20 mg total) by mouth 2 (two) times daily. 11/08/21   Valarie Merino, MD  feeding supplement, GLUCERNA SHAKE, (GLUCERNA SHAKE) LIQD Take 237 mLs by mouth 3 (three) times daily between meals. 11/05/21   [name], [name], DO  HUMULIN R 100 UNIT/ML injection Inject 20 Units into  the skin 3 (three) times daily before meals. Per sliding scale 03/24/20   [provider]  Levonorgestrel (LILETTA) 19.5 MCG/DAY IUD IUD 1 each by Intrauterine route continuous. Placed in 2019    [provider]  lidocaine (LIDODERM) 5 % Place 1 patch onto the skin daily. Remove & Discard patch within 12 hours or as directed by MD 12/08/21   Tegeler, Gwenyth Allegra, MD  nicotine (NICODERM CQ - DOSED IN MG/24 HR) 7 mg/24hr patch Place 1 patch (7 mg total) onto the skin daily. 10/28/21   Nita Sells, MD  ondansetron (ZOFRAN ODT) 4 MG disintegrating tablet 81m ODT q4 hours prn nausea/vomit Patient taking differently: Take 4 mg by mouth every 4 (four) hours as needed for nausea or vomiting. 09/04/21   Mesner, JCorene Cornea MD  oxyCODONE (OXY IR/ROXICODONE) 5 MG immediate release tablet Take 1 tablet (5 mg total) by mouth every 4 (four) hours as needed for severe pain. 10/28/21   SNita Sells MD  pantoprazole (PROTONIX) 20 MG tablet Take 1 tablet (20 mg total) by mouth daily. 10/02/21   GAzucena Cecil PA-C  potassium chloride SA (KLOR-CON M) 20 MEQ tablet Take 2 tablets (40 mEq total) by mouth daily for 7 days. 11/24/21 12/01/21  AEvlyn Courier PA-C  saccharomyces boulardii (FLORASTOR) 250 MG capsule Take 1 capsule (250 mg total) by mouth 2 (two) times daily. 11/05/21   [name], [name], DO    Allergies Hydrocodone, Iodinated contrast media, Cephalexin, Cetirizine & related, Toradol [ketorolac tromethamine], Flagyl [metronidazole], and Nsaids   REVIEW OF SYSTEMS  Negative except as noted here or in the History of Present Illness.   PHYSICAL EXAMINATION  Initial Vital Signs Blood pressure (!) 125/95, pulse (!) 106, temperature 98.5 F (36.9 C), resp. rate 20, height '5\' 7"'  (1.702 m), weight 67.1 kg, SpO2 100 %.  Examination General: Well-developed, well-nourished female in no acute distress; appearance consistent with age of record HENT: normocephalic; atraumatic Eyes: Normal  appearance Neck: supple Heart: regular rate and rhythm Lungs: clear to auscultation bilaterally Abdomen: soft; nondistended; nontender; bowel sounds present Back: Bilateral paralumbar tenderness; positive straight leg raise bilaterally at 15 degrees Extremities: No deformity; full range of motion; pulses normal Neurologic: Awake, alert and oriented; motor function intact in all extremities and symmetric; sensation intact and symmetric in lower extremities with the exception of decreased sensation of feet; no facial droop; no saddle anesthesia Skin: Warm and dry Psychiatric: Normal mood and affect   RESULTS  Summary of this visit's results, reviewed and interpreted by myself:   EKG Interpretation  Date/Time:    Ventricular Rate:    PR Interval:    QRS Duration:   QT Interval:    QTC  Calculation:   R Axis:     Text Interpretation:         Laboratory Studies: No results found for this or any previous visit (from the past 24 hour(s)). Imaging Studies: No results found.  ED COURSE and MDM  Nursing notes, initial and subsequent vitals signs, including pulse oximetry, reviewed and interpreted by myself.  Vitals:   01/01/22 0036 01/01/22 0039  BP: (!) 125/95   Pulse: (!) 106   Resp: 20   Temp: 98.5 F (36.9 C)   SpO2: 100%   Weight:  67.1 kg  Height:  '5\' 7"'  (1.702 m)   Medications  fosfomycin (MONUROL) packet 3 g (has no administration in time range)  HYDROmorphone (DILAUDID) injection 2 mg (has no administration in time range)    There are no concerning signs on physical exam to suggest a cauda equina syndrome.  Her foot numbness is subacute and her reported bowel difficulties have been going on for several weeks.  I do not believe an emergent MRI is indicated this morning but we will set her up for an outpatient MRI.  Since she has not taken her antibiotic and her urine culture did grow out an organism we will treat her with a dose of fosfomycin in the ED which should  treat her UTI  PROCEDURES  Procedures   ED DIAGNOSES     ICD-10-CM   1. Chronic bilateral low back pain with bilateral sciatica  M54.42    M54.41    G89.29     2. Lower urinary tract infectious disease  N39.0          Raedyn Klinck, MD 01/01/22 0112    Shanon Rosser, MD 01/01/22 8768

## 2022-01-02 ENCOUNTER — Telehealth: Payer: Self-pay

## 2022-01-02 NOTE — Telephone Encounter (Signed)
Post ED Visit - Positive Culture Follow-up ? ?Culture report reviewed by antimicrobial stewardship pharmacist: ?[place] Team ?'[]'$  Elenor Quinones, Pharm.D. ?'[x]'$  Heide Guile, Pharm.D., BCPS AQ-ID ?'[]'$  Parks Neptune, Pharm.D., BCPS ?'[]'$  Alycia Rossetti, Pharm.D., BCPS ?'[]'$  [place], Pharm.D., BCPS, AAHIVP ?'[]'$  Legrand Como, Pharm.D., BCPS, AAHIVP ?'[]'$  Salome Arnt, PharmD, BCPS ?'[]'$  Johnnette Gourd, PharmD, BCPS ?'[]'$  Hughes Better, PharmD, BCPS ?'[]'$  Leeroy Cha, PharmD ?'[]'$  Laqueta Linden, PharmD, BCPS ?'[]'$  Albertina Parr, PharmD ? ?[place] Team ?'[]'$  Leodis Sias, PharmD ?'[]'$  Lindell Spar, PharmD ?'[]'$  Royetta Asal, PharmD ?'[]'$  Graylin Shiver, Rph ?'[]'$  Rema Fendt) Jodi Mac, PharmD ?'[]'$  Arlyn Dunning, PharmD ?'[]'$  Netta Cedars, PharmD ?'[]'$  Dia Sitter, PharmD ?'[]'$  Leone Haven, PharmD ?'[]'$  Gretta Arab, PharmD ?'[]'$  Theodis Shove, PharmD ?'[]'$  Peggyann Juba, PharmD ?'[]'$  Reuel Boom, PharmD ? ? ?Positive urine culture ?Treated with Nitrofurantoin Monohyd Macro, organism sensitive to the same and no further patient follow-up is required at this time. ? ?Jodi Mcgee ?01/02/2022, 8:56 AM ?  ?

## 2022-01-24 ENCOUNTER — Ambulatory Visit: Payer: Medicaid Other | Admitting: Obstetrics & Gynecology

## 2022-04-23 IMAGING — CT CT ABD-PELV W/O CM
2 of 4 series · 16 of 46 positions shown, 18 images · non-contrast
Comparison: 04/01/2016

CLINICAL DATA: Left lower quadrant abdominal pain, nausea,
vomiting, diarrhea. Recent UTI

EXAM:
CT ABDOMEN AND PELVIS WITHOUT CONTRAST
TECHNIQUE: Multidetector CT imaging of the abdomen and pelvis was performed
following the standard protocol without IV contrast.

[Series 2: axial st · axial · 0.77mm/px · z∈[+472,+922]mm · 13 of 98 slices shown, 15 images]
[im 4/98  soft-tissue]
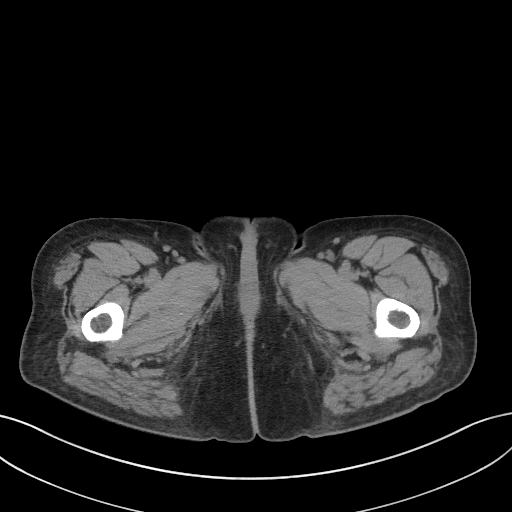
[im 4/98  bone]
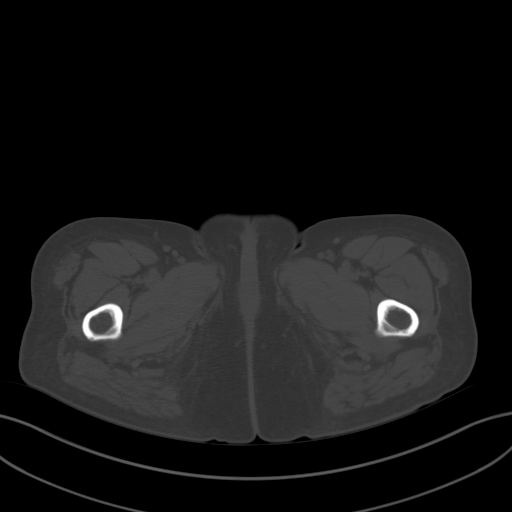
[im 12/98  soft-tissue]
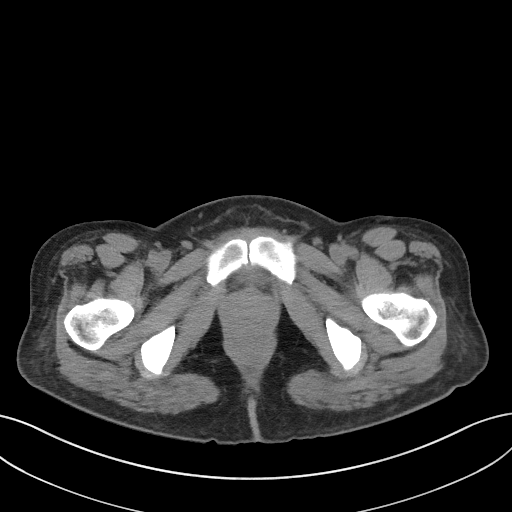
[im 20/98  soft-tissue]
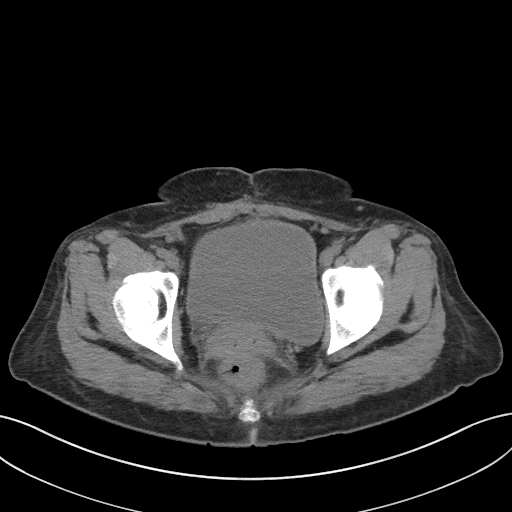
[im 28/98  soft-tissue]
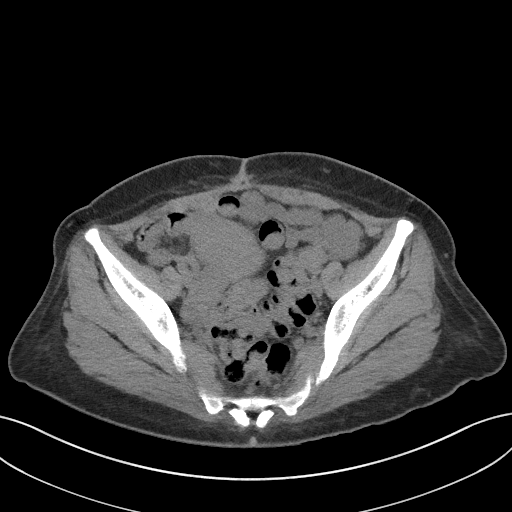
[im 35/98  soft-tissue]
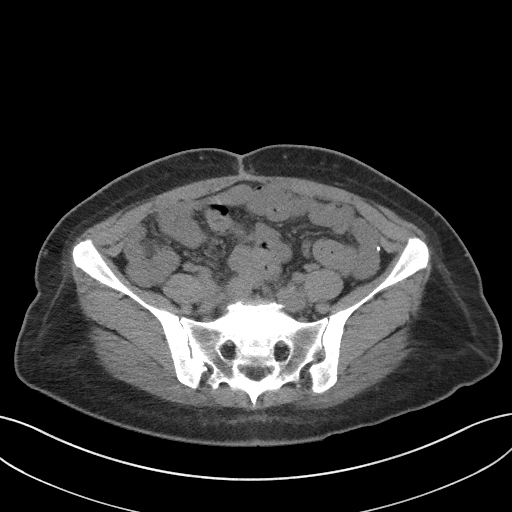
[im 43/98  soft-tissue]
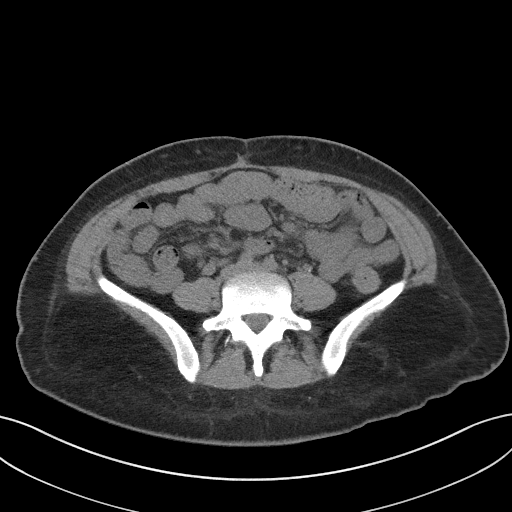
[im 51/98  soft-tissue]
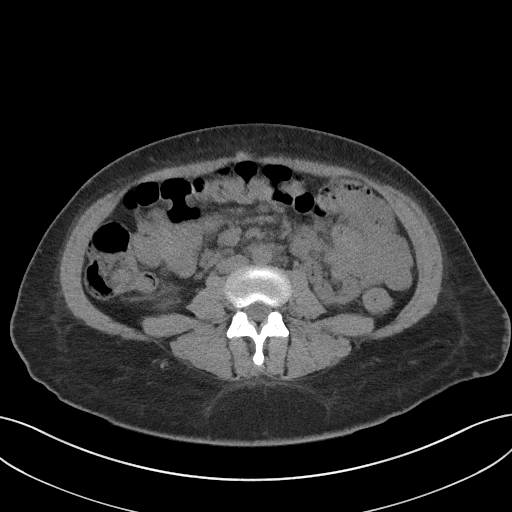
[im 55/98  soft-tissue]
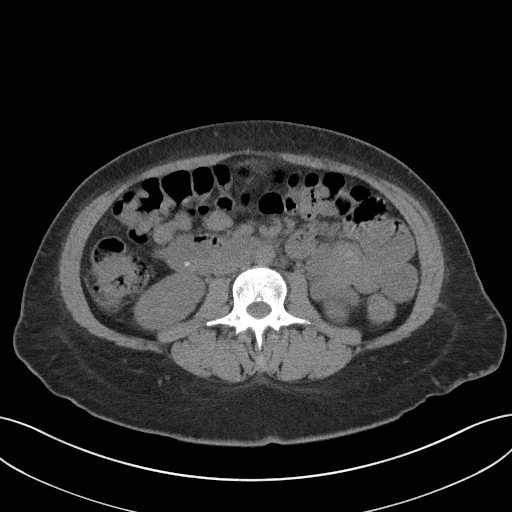
[im 63/98  soft-tissue]
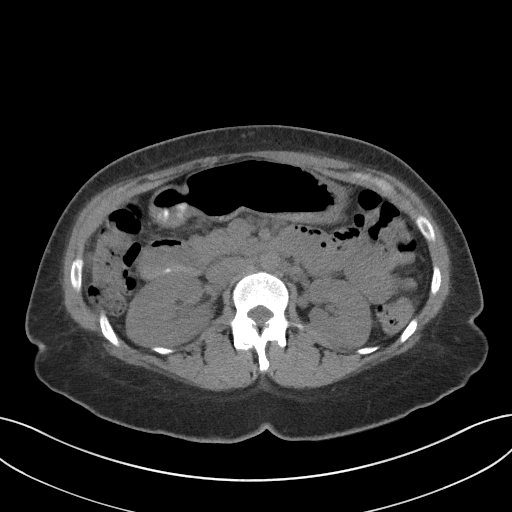
[im 63/98  bone]
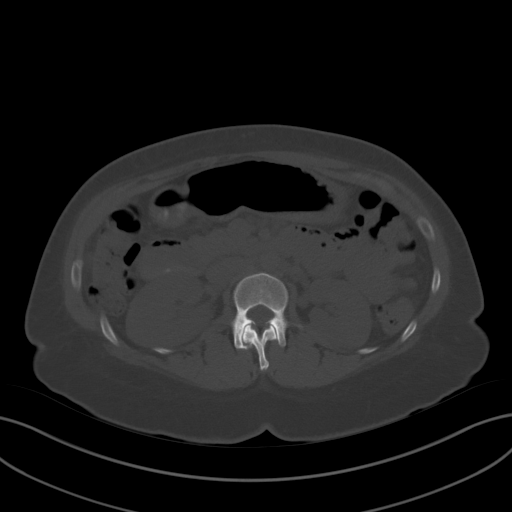
[im 70/98  soft-tissue]
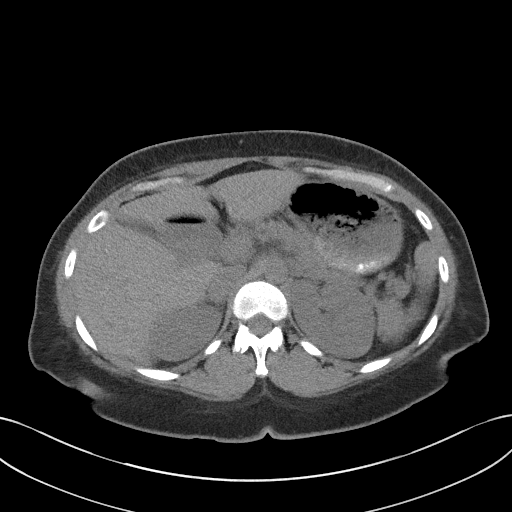
[im 78/98  soft-tissue]
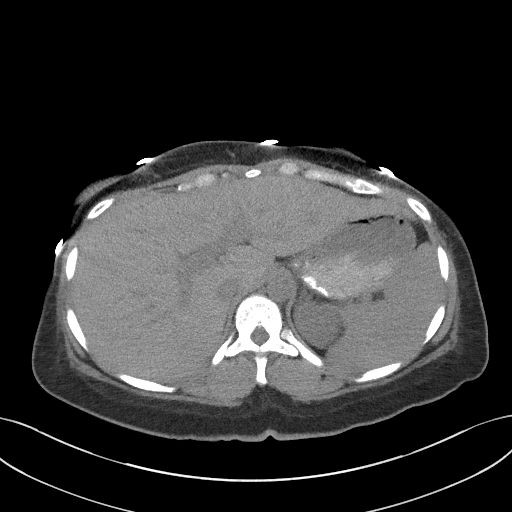
[im 86/98  soft-tissue]
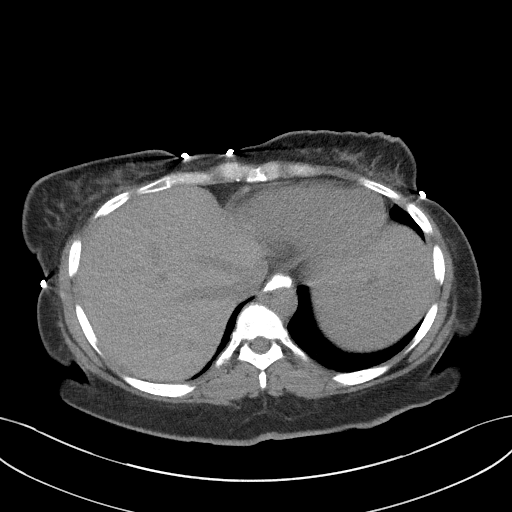
[im 94/98  soft-tissue]
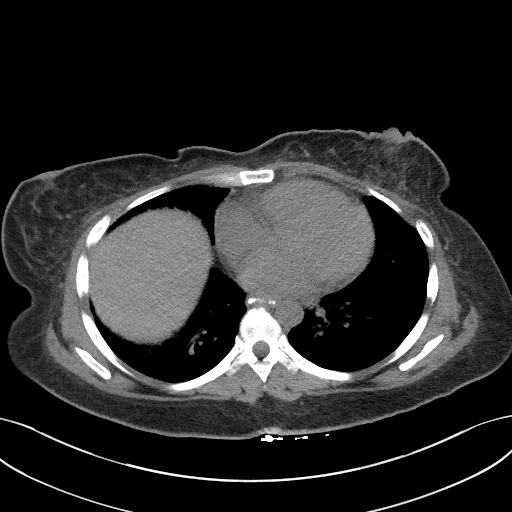

[Series 5: coronal st · coronal · 0.83mm/px · 3 of 91 slices shown]
[im 31/91  soft-tissue]
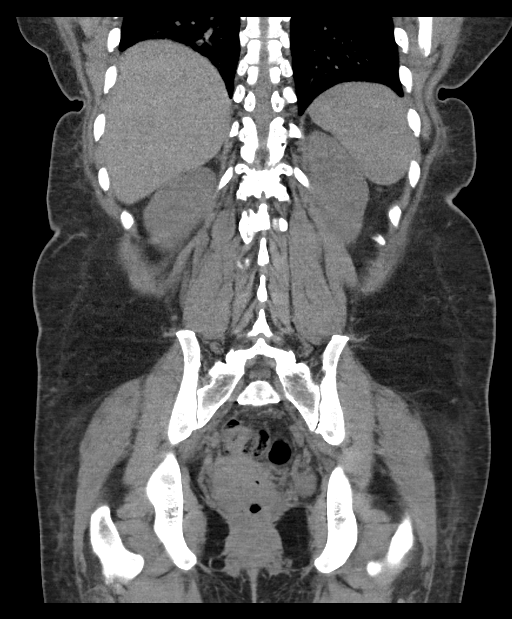
[im 41/91  soft-tissue]
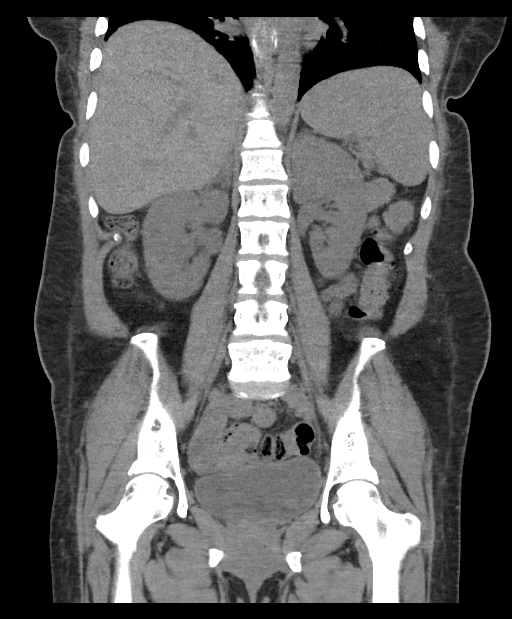
[im 51/91  soft-tissue]
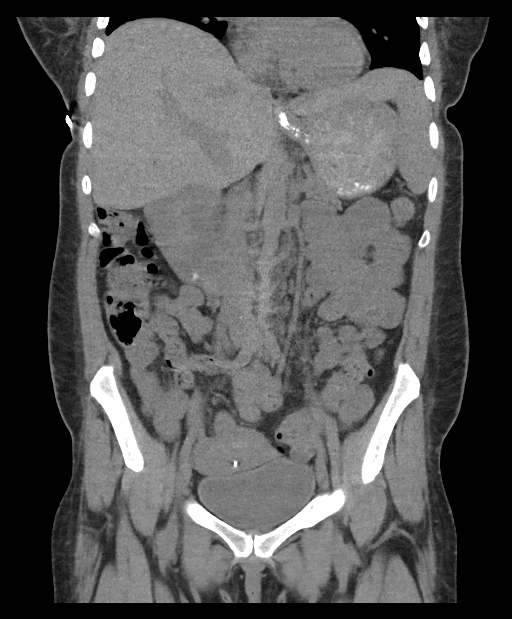

[16 of 46 positions shown; findings below may reference images not displayed]

FINDINGS: Lower chest: No acute abnormality.

Hepatobiliary: Unremarkable unenhanced appearance of the liver. No
focal liver lesion identified. Gallbladder within normal limits. No
hyperdense gallstone. No biliary dilatation.

Pancreas: Unremarkable. No pancreatic ductal dilatation or
surrounding inflammatory changes.

Spleen: Normal in size without focal abnormality.

Adrenals/Urinary Tract: Adrenal glands are unremarkable. Kidneys are
normal, without renal calculi, focal lesion, or hydronephrosis.
Bladder is unremarkable.

Stomach/Bowel: Evaluation of the bowel is limited in the absence of
intravenous or oral contrast. A small amount of high-density
material within the distal esophagus and stomach is noted, which may
reflect a small amount of contrast or bismuth related product.
Stomach within normal limits. No dilated loops of bowel. No focal
bowel wall thickening or inflammatory changes.

Vascular/Lymphatic: No significant vascular findings are present. No
enlarged abdominal or pelvic lymph nodes.

Reproductive: IUD present within the uterus.  No adnexal masses.

Other: No free fluid. No abdominopelvic fluid collection. No
pneumoperitoneum. No abdominal wall hernia.

Musculoskeletal: No acute or significant osseous findings.
IMPRESSION: No acute abdominopelvic findings.

## 2022-05-10 ENCOUNTER — Emergency Department (HOSPITAL_COMMUNITY)
Admission: EM | Admit: 2022-05-10 | Discharge: 2022-05-11 | Disposition: A | Discharge status: Home / Self Care | Payer: Medicaid Other | Attending: Emergency Medicine | Admitting: Emergency Medicine

## 2022-05-10 ENCOUNTER — Other Ambulatory Visit: Payer: Self-pay

## 2022-05-10 DIAGNOSIS — E876 Hypokalemia: Secondary | ICD-10-CM | POA: Diagnosis not present

## 2022-05-10 DIAGNOSIS — R109 Unspecified abdominal pain: Secondary | ICD-10-CM | POA: Diagnosis not present

## 2022-05-10 DIAGNOSIS — R112 Nausea with vomiting, unspecified: Secondary | ICD-10-CM | POA: Insufficient documentation

## 2022-05-10 LAB — CBC WITH DIFFERENTIAL/PLATELET
Abs Immature Granulocytes: 0.01 10*3/uL (ref 0.00–0.07)
Basophils Absolute: 0 10*3/uL (ref 0.0–0.1)
Basophils Relative: 1 %
Eosinophils Absolute: 0.1 10*3/uL (ref 0.0–0.5)
Eosinophils Relative: 2 %
HCT: 37.4 % (ref 36.0–46.0)
Hemoglobin: 12 g/dL (ref 12.0–15.0)
Immature Granulocytes: 0 %
Lymphocytes Relative: 17 %
Lymphs Abs: 0.9 10*3/uL (ref 0.7–4.0)
MCH: 26.9 pg (ref 26.0–34.0)
MCHC: 32.1 g/dL (ref 30.0–36.0)
MCV: 83.9 fL (ref 80.0–100.0)
Monocytes Absolute: 0.2 10*3/uL (ref 0.1–1.0)
Monocytes Relative: 4 %
Neutro Abs: 4.2 10*3/uL (ref 1.7–7.7)
Neutrophils Relative %: 76 %
Platelets: 355 10*3/uL (ref 150–400)
RBC: 4.46 MIL/uL (ref 3.87–5.11)
RDW: 14.2 % (ref 11.5–15.5)
WBC: 5.4 10*3/uL (ref 4.0–10.5)
nRBC: 0 % (ref 0.0–0.2)

## 2022-05-10 LAB — COMPREHENSIVE METABOLIC PANEL
ALT: 6 U/L (ref 0–44)
AST: 11 U/L — ABNORMAL LOW (ref 15–41)
Albumin: 3.5 g/dL (ref 3.5–5.0)
Alkaline Phosphatase: 79 U/L (ref 38–126)
Anion gap: 9 (ref 5–15)
BUN: 6 mg/dL (ref 6–20)
CO2: 25 mmol/L (ref 22–32)
Calcium: 9 mg/dL (ref 8.9–10.3)
Chloride: 108 mmol/L (ref 98–111)
Creatinine, Ser: 0.64 mg/dL (ref 0.44–1.00)
GFR, Estimated: >60 mL/min (ref >60)
Glucose, Bld: 124 mg/dL — ABNORMAL HIGH (ref 70–99)
Potassium: 2.9 mmol/L — ABNORMAL LOW (ref 3.5–5.1)
Sodium: 142 mmol/L (ref 135–145)
Total Bilirubin: 0.9 mg/dL (ref 0.3–1.2)
Total Protein: 7.6 g/dL (ref 6.5–8.1)

## 2022-05-10 LAB — LIPASE, BLOOD: Lipase: 30 U/L (ref 11–51)

## 2022-05-10 LAB — I-STAT BETA HCG BLOOD, ED (MC, WL, AP ONLY): I-stat hCG, quantitative: <5 m[IU]/mL (ref <5)

## 2022-05-10 MED ORDER — OXYCODONE-ACETAMINOPHEN 5-325 MG PO TABS
1.0000 | ORAL_TABLET | Freq: Once | ORAL | Status: AC
  Administered 2022-05-10: 1 via ORAL
  Filled 2022-05-10: qty 1

## 2022-05-10 MED ORDER — LACTATED RINGERS IV BOLUS: 1000.0000 mL | Freq: Once | INTRAVENOUS | Status: DC

## 2022-05-10 MED ORDER — ONDANSETRON 4 MG PO TBDP
4.0000 mg | ORAL_TABLET | Freq: Once | ORAL | Status: AC
  Administered 2022-05-10: 4 mg via ORAL
  Filled 2022-05-10: qty 1

## 2022-05-10 NOTE — ED Provider Triage Note (Signed)
  Emergency Medicine Provider Triage Evaluation Note  MRN:  [id number]  Arrival date & time: 05/10/22    Medically screening exam initiated at 10:09 PM.   CC:   Abdominal Pain and Emesis   HPI:  Jodi Mcgee is a 36 y.o. year-old female presents to the ED with chief complaint of upper abdominal pain.  Hx of pancreatitis.  States this feels similar.  States she was drinking over the weekend and this started a couple of days ago and the worsened today.  Denies fever.  History provided by patient. ROS:  -As included in HPI PE:   Vitals:   05/10/22 2212  BP: (!) 134/104  Pulse: (!) 102  Resp: (!) 22  Temp: 98.4 F (36.9 C)  SpO2: 100%    Non-toxic appearing No respiratory distress Retching MDM:   I've ordered labs in triage to expedite lab/diagnostic workup.  Patient was informed that the remainder of the evaluation will be completed by another provider, this initial triage assessment does not replace that evaluation, and the importance of remaining in the ED until their evaluation is complete.    Montine Circle, PA-C 05/10/22 2212

## 2022-05-10 NOTE — ED Triage Notes (Signed)
Pt here from home via [facility] for abd pain x1 week w/ pain worsening today w/ N/V. Pt states she has had 2 episodes of vomiting. 159/124, 87HR.

## 2022-05-10 NOTE — ED Notes (Signed)
Pt closed triage room door, then proceeded to yell "help me"

## 2022-05-11 ENCOUNTER — Emergency Department (HOSPITAL_COMMUNITY): Payer: Medicaid Other

## 2022-05-11 MED ORDER — SODIUM CHLORIDE 0.9 % IV SOLN
25.0000 mg | INTRAVENOUS | Status: AC
  Administered 2022-05-11: 25 mg via INTRAVENOUS
  Filled 2022-05-11: qty 1

## 2022-05-11 MED ORDER — POTASSIUM CHLORIDE 20 MEQ PO PACK
60.0000 meq | PACK | ORAL | Status: AC
Start: 2022-05-11 — End: 2022-05-11
  Administered 2022-05-11: 60 meq via ORAL
  Filled 2022-05-11: qty 3

## 2022-05-11 MED ORDER — MORPHINE SULFATE (PF) 4 MG/ML IV SOLN
4.0000 mg | Freq: Once | INTRAVENOUS | Status: AC
  Administered 2022-05-11: 4 mg via INTRAVENOUS
  Filled 2022-05-11: qty 1

## 2022-05-11 MED ORDER — PROMETHAZINE HCL 25 MG PO TABS: 25.0000 mg | ORAL_TABLET | Freq: Four times a day (QID) | ORAL | 0 refills | Status: DC | PRN

## 2022-05-11 MED ORDER — ONDANSETRON 4 MG PO TBDP: ORAL_TABLET | ORAL | 0 refills | Status: DC

## 2022-05-11 MED ORDER — POTASSIUM CHLORIDE CRYS ER 20 MEQ PO TBCR: 40.0000 meq | EXTENDED_RELEASE_TABLET | Freq: Every day | ORAL | 0 refills | Status: DC

## 2022-05-11 MED ORDER — LACTATED RINGERS IV BOLUS
1000.0000 mL | Freq: Once | INTRAVENOUS | Status: AC
  Administered 2022-05-11: 1000 mL via INTRAVENOUS

## 2022-05-11 MED ORDER — LORAZEPAM 2 MG/ML IJ SOLN
0.5000 mg | Freq: Once | INTRAMUSCULAR | Status: AC
  Administered 2022-05-11: 0.5 mg via INTRAVENOUS
  Filled 2022-05-11: qty 1

## 2022-05-11 NOTE — Discharge Instructions (Addendum)
Please stay hydrated.  Take Zofran for nausea  Take potassium as prescribed  See your doctor for follow-up.  You were prescribed Percocet by your doctor and you need to get further prescriptions from your doctor  Return to ER if you have worse abdominal pain, vomiting, fever, dehydration

## 2022-05-11 NOTE — ED Provider Notes (Signed)
  Physical Exam  BP (!) 145/104 (BP Location: Left Arm)   Pulse 100   Temp 98 F (36.7 C) (Oral)   Resp 16   SpO2 99%   Physical Exam  Procedures  Procedures  ED Course / MDM   Clinical Course as of 05/11/22 1723  Thu May 11, 2022  1241 Comprehensive metabolic panel(!) C-Met reviewed and evaluated and significant for hypokalemia with potassium of 2.9 otherwise within normal limits Lipase reviewed and evaluated normal CBC reviewed evaluation normal I-STAT beta-hCG is negative [DR]  1520 CT abdomen pelvis reviewed interpreted no evidence of acute abdominal or pelvic findings Radiologist interpretation reviewed and no acute abdominal pelvic findings, no renal ureteral or bladder calculi or mass stable right adrenal gland adenoma noted [DR]    Clinical Course User Index [DR] Pattricia Boss, MD   Medical Decision Making Care assumed at 4 pm. Patient is here with abdominal pain and vomiting. K is 2.9, supplemented.  Signout pending reassessment  5:24 PM Patient has no vomiting in the ED.  Felt better after IV fluids and potassium.  Will discharge home with potassium supplementation and Zofran.  Stable for discharge  Problems Addressed: Hypokalemia: acute illness or injury Nausea and vomiting, unspecified vomiting type: acute illness or injury  Amount and/or Complexity of Data Reviewed Labs: ordered. Decision-making details documented in ED Course. Radiology: ordered and independent interpretation performed. Decision-making details documented in ED Course.  Risk Prescription drug management.          Drenda Freeze, MD 05/11/22 [phone]

## 2022-05-11 NOTE — ED Provider Notes (Signed)
[facility] EMERGENCY DEPARTMENT Provider Note   CSN: [id number] Arrival date & time: 05/10/22  2202     History  Chief Complaint  Patient presents with   Abdominal Pain   Emesis    Jodi Mcgee is a 36 y.o. female.  HPI 36 year old female history of pancreatitis, states she drank alcohol over the weekend and has been having abdominal pain with nausea and vomiting for 2 days.  She states she has been unable to keep anything down.  Feels like her previous pancreatitis.  She states that she now has worsening of her back pain.  She states she has disc problems in her back and that she slept on the couch out in the waiting room and now her back is hurting her more than it was previously     Home Medications Prior to Admission medications   Medication Sig Start Date End Date Taking? Authorizing Provider  promethazine (PHENERGAN) 25 MG tablet Take 1 tablet (25 mg total) by mouth every 6 (six) hours as needed for nausea or vomiting. 05/11/22  Yes Pattricia Boss, MD  amLODipine (NORVASC) 5 MG tablet Take 1 tablet (5 mg total) by mouth daily. 10/28/21   Nita Sells, MD  blood glucose meter kit and supplies KIT Dispense based on patient and insurance preference. Use up to four times daily as directed. (FOR ICD-9 250.00, 250.01). 02/21/19   Oretha Milch D, MD  colestipol (COLESTID) 5 g granules Take 5 g by mouth 2 (two) times daily. 11/24/21 12/24/21  Evlyn Courier, PA-C  dicyclomine (BENTYL) 20 MG tablet Take 1 tablet (20 mg total) by mouth 2 (two) times daily. 10/02/21   Azucena Cecil, PA-C  dicyclomine (BENTYL) 20 MG tablet Take 1 tablet (20 mg total) by mouth 2 (two) times daily. 11/08/21   Valarie Merino, MD  feeding supplement, GLUCERNA SHAKE, (GLUCERNA SHAKE) LIQD Take 237 mLs by mouth 3 (three) times daily between meals. 11/05/21   [name], [name], DO  HUMULIN R 100 UNIT/ML injection Inject 20 Units into the skin 3 (three) times daily before meals. Per sliding  scale 03/24/20   [provider]  Levonorgestrel (LILETTA) 19.5 MCG/DAY IUD IUD 1 each by Intrauterine route continuous. Placed in 2019    [provider]  lidocaine (LIDODERM) 5 % Place 1 patch onto the skin daily. Remove & Discard patch within 12 hours or as directed by MD 12/08/21   Tegeler, Gwenyth Allegra, MD  nicotine (NICODERM CQ - DOSED IN MG/24 HR) 7 mg/24hr patch Place 1 patch (7 mg total) onto the skin daily. 10/28/21   Nita Sells, MD  ondansetron (ZOFRAN ODT) 4 MG disintegrating tablet 66m ODT q4 hours prn nausea/vomit Patient taking differently: Take 4 mg by mouth every 4 (four) hours as needed for nausea or vomiting. 09/04/21   Mesner, JCorene Cornea MD  oxyCODONE (OXY IR/ROXICODONE) 5 MG immediate release tablet Take 1 tablet (5 mg total) by mouth every 4 (four) hours as needed for severe pain. 10/28/21   SNita Sells MD  pantoprazole (PROTONIX) 20 MG tablet Take 1 tablet (20 mg total) by mouth daily. 10/02/21   GAzucena Cecil PA-C  potassium chloride SA (KLOR-CON M) 20 MEQ tablet Take 2 tablets (40 mEq total) by mouth daily for 7 days. 11/24/21 12/01/21  AEvlyn Courier PA-C  saccharomyces boulardii (FLORASTOR) 250 MG capsule Take 1 capsule (250 mg total) by mouth 2 (two) times daily. 11/05/21   [name], [name], DO      Allergies  Hydrocodone, Iodinated contrast media, Cephalexin, Cetirizine & related, Toradol [ketorolac tromethamine], Flagyl [metronidazole], and Nsaids    Review of Systems   Review of Systems  Physical Exam Updated Vital Signs BP (!) 145/104 (BP Location: Left Arm)   Pulse 100   Temp 98 F (36.7 C) (Oral)   Resp 16   SpO2 99%  Physical Exam  ED Results / Procedures / Treatments   Labs (all labs ordered are listed, but only abnormal results are displayed) Labs Reviewed  COMPREHENSIVE METABOLIC PANEL - Abnormal; Notable for the following components:      Result Value   Potassium 2.9 (*)    Glucose, Bld 124 (*)    AST 11 (*)    All  other components within normal limits  LIPASE, BLOOD  CBC WITH DIFFERENTIAL/PLATELET  URINALYSIS, ROUTINE W REFLEX MICROSCOPIC  I-STAT BETA HCG BLOOD, ED ([facility], [facility], [facility] ONLY)    EKG None  Radiology CT ABDOMEN PELVIS WO CONTRAST  Result Date: 05/11/2022 CLINICAL DATA:  One week history abdominal pain with nausea and vomiting. EXAM: CT ABDOMEN AND PELVIS WITHOUT CONTRAST TECHNIQUE: Multidetector CT imaging of the abdomen and pelvis was performed following the standard protocol without IV contrast. RADIATION DOSE REDUCTION: This exam was performed according to the departmental dose-optimization program which includes automated exposure control, adjustment of the mA and/or kV according to patient size and/or use of iterative reconstruction technique. COMPARISON:  11/08/2021 FINDINGS: Lower chest: The lung bases are clear of acute process. No pleural effusion or pulmonary lesions. The heart is normal in size. No pericardial effusion. The distal esophagus and aorta are unremarkable. Hepatobiliary: No focal hepatic lesions or intrahepatic biliary dilatation. The gallbladder is normal. No common bile duct dilatation. Pancreas: No mass, inflammation or ductal dilatation. Spleen: Normal size. No focal lesions. Adrenals/Urinary Tract: Stable 2.0 cm right adrenal gland nodule. This measures 19 Hounsfield units. This is consistent with a benign adenoma. No renal, ureteral or bladder calculi or mass. Stomach/Bowel: The stomach, duodenum, small bowel and colon are grossly normal without oral contrast. No inflammatory changes, mass lesions or obstructive findings. The appendix is normal. Vascular/Lymphatic: Insert aorta noncontrast Reproductive: The uterus and ovaries are unremarkable. An IUD is noted in the endometrial canal. Other: No pelvic mass or adenopathy. No free pelvic fluid collections. No inguinal mass or adenopathy. No abdominal wall hernia or subcutaneous lesions. Musculoskeletal: No significant bony  findings. IMPRESSION: 1. No acute abdominal/pelvic findings, mass lesions or adenopathy. 2. No renal, ureteral or bladder calculi or mass. 3. Stable right adrenal gland adenoma. Electronically Signed   By: Marijo Sanes M.D.   On: 05/11/2022 14:29    Procedures Procedures    Medications Ordered in ED Medications  lactated ringers bolus 1,000 mL (has no administration in time range)  ondansetron (ZOFRAN-ODT) disintegrating tablet 4 mg (4 mg Oral Given 05/10/22 2220)  oxyCODONE-acetaminophen (PERCOCET/ROXICET) 5-325 MG per tablet 1 tablet (1 tablet Oral Given 05/10/22 2220)  potassium chloride (KLOR-CON) packet 60 mEq (60 mEq Oral Given 05/11/22 1235)  lactated ringers bolus 1,000 mL (1,000 mLs Intravenous Bolus 05/11/22 1236)  promethazine (PHENERGAN) 25 mg in sodium chloride 0.9 % 50 mL IVPB (0 mg Intravenous Stopped 05/11/22 1255)  LORazepam (ATIVAN) injection 0.5 mg (0.5 mg Intravenous Given 05/11/22 1300)    ED Course/ Medical Decision Making/ A&P Clinical Course as of 05/11/22 1625  Thu May 11, 2022  1241 Comprehensive metabolic panel(!) C-Met reviewed and evaluated and significant for hypokalemia with potassium of 2.9 otherwise within normal limits  Lipase reviewed and evaluated normal CBC reviewed evaluation normal I-STAT beta-hCG is negative [DR]  1520 CT abdomen pelvis reviewed interpreted no evidence of acute abdominal or pelvic findings Radiologist interpretation reviewed and no acute abdominal pelvic findings, no renal ureteral or bladder calculi or mass stable right adrenal gland adenoma noted [DR]    Clinical Course User Index [DR] Pattricia Boss, MD                           Medical Decision Making 36 year old female presents today with epigastric pain, nausea, vomiting. Patient with known history of alcohol use and pancreatitis Differential diagnosis includes but is not limited to pancreatitis, gastritis, diseases of the hepatobiliary system, small bowel obstruction, patient  is status post appendectomy, pregnancy test is negative. Patient's labs do not show any evidence of acute abnormalities except for hypokalemia Potassium is being repleted in the ED Plan CT to evaluate for other etiologies as noted above Patient also complaining of back pain she states worsened when she was in the waiting room.  She has no acute neurological deficits.  She is treated with Ativan for musculoskeletal pain. Patient resting. And oral fluid trial. Plan discharge afterwards Care discussed with Dr. Darl Householder and he will follow-up as needed  Amount and/or Complexity of Data Reviewed Labs: ordered. Decision-making details documented in ED Course. Radiology: ordered.  Risk Prescription drug management.           Final Clinical Impression(s) / ED Diagnoses Final diagnoses:  Nausea and vomiting, unspecified vomiting type    Rx / DC Orders ED Discharge Orders          Ordered    promethazine (PHENERGAN) 25 MG tablet  Every 6 hours PRN        05/11/22 1625              Pattricia Boss, MD 05/11/22 1625

## 2022-07-09 ENCOUNTER — Emergency Department (HOSPITAL_COMMUNITY): Payer: Medicaid Other

## 2022-07-09 ENCOUNTER — Encounter (HOSPITAL_COMMUNITY): Payer: Self-pay | Admitting: *Deleted

## 2022-07-09 ENCOUNTER — Other Ambulatory Visit: Payer: Self-pay

## 2022-07-09 ENCOUNTER — Emergency Department (HOSPITAL_COMMUNITY)
Admission: EM | Admit: 2022-07-09 | Discharge: 2022-07-09 | Disposition: A | Discharge status: Home / Self Care | Payer: Medicaid Other | Attending: Emergency Medicine | Admitting: Emergency Medicine

## 2022-07-09 DIAGNOSIS — E119 Type 2 diabetes mellitus without complications: Secondary | ICD-10-CM | POA: Diagnosis not present

## 2022-07-09 DIAGNOSIS — K529 Noninfective gastroenteritis and colitis, unspecified: Secondary | ICD-10-CM | POA: Diagnosis not present

## 2022-07-09 DIAGNOSIS — R112 Nausea with vomiting, unspecified: Secondary | ICD-10-CM | POA: Diagnosis not present

## 2022-07-09 DIAGNOSIS — R1013 Epigastric pain: Secondary | ICD-10-CM | POA: Diagnosis present

## 2022-07-09 DIAGNOSIS — R000 Tachycardia, unspecified: Secondary | ICD-10-CM | POA: Insufficient documentation

## 2022-07-09 LAB — CBC WITH DIFFERENTIAL/PLATELET
Abs Immature Granulocytes: 0.01 10*3/uL (ref 0.00–0.07)
Basophils Absolute: 0 10*3/uL (ref 0.0–0.1)
Basophils Relative: 0 %
Eosinophils Absolute: 0 10*3/uL (ref 0.0–0.5)
Eosinophils Relative: 0 %
HCT: 41.8 % (ref 36.0–46.0)
Hemoglobin: 13.3 g/dL (ref 12.0–15.0)
Immature Granulocytes: 0 %
Lymphocytes Relative: 13 %
Lymphs Abs: 0.6 10*3/uL — ABNORMAL LOW (ref 0.7–4.0)
MCH: 27 pg (ref 26.0–34.0)
MCHC: 31.8 g/dL (ref 30.0–36.0)
MCV: 85 fL (ref 80.0–100.0)
Monocytes Absolute: 0.1 10*3/uL (ref 0.1–1.0)
Monocytes Relative: 2 %
Neutro Abs: 4 10*3/uL (ref 1.7–7.7)
Neutrophils Relative %: 85 %
Platelets: 298 10*3/uL (ref 150–400)
RBC: 4.92 MIL/uL (ref 3.87–5.11)
RDW: 14.1 % (ref 11.5–15.5)
WBC: 4.7 10*3/uL (ref 4.0–10.5)
nRBC: 0 % (ref 0.0–0.2)

## 2022-07-09 LAB — COMPREHENSIVE METABOLIC PANEL
ALT: 21 U/L (ref 0–44)
AST: 18 U/L (ref 15–41)
Albumin: 3.7 g/dL (ref 3.5–5.0)
Alkaline Phosphatase: 74 U/L (ref 38–126)
Anion gap: 6 (ref 5–15)
BUN: 9 mg/dL (ref 6–20)
CO2: 25 mmol/L (ref 22–32)
Calcium: 9.5 mg/dL (ref 8.9–10.3)
Chloride: 111 mmol/L (ref 98–111)
Creatinine, Ser: 0.48 mg/dL (ref 0.44–1.00)
GFR, Estimated: >60 mL/min (ref >60)
Glucose, Bld: 129 mg/dL — ABNORMAL HIGH (ref 70–99)
Potassium: 3.2 mmol/L — ABNORMAL LOW (ref 3.5–5.1)
Sodium: 142 mmol/L (ref 135–145)
Total Bilirubin: 1 mg/dL (ref 0.3–1.2)
Total Protein: 8 g/dL (ref 6.5–8.1)

## 2022-07-09 LAB — LIPASE, BLOOD: Lipase: 24 U/L (ref 11–51)

## 2022-07-09 LAB — MAGNESIUM: Magnesium: 1.9 mg/dL (ref 1.7–2.4)

## 2022-07-09 MED ORDER — ONDANSETRON HCL 4 MG/2ML IJ SOLN
4.0000 mg | Freq: Once | INTRAMUSCULAR | Status: AC
  Administered 2022-07-09: 4 mg via INTRAVENOUS
  Filled 2022-07-09: qty 2

## 2022-07-09 MED ORDER — POTASSIUM CHLORIDE CRYS ER 20 MEQ PO TBCR
40.0000 meq | EXTENDED_RELEASE_TABLET | Freq: Once | ORAL | Status: AC
  Administered 2022-07-09: 40 meq via ORAL
  Filled 2022-07-09: qty 2

## 2022-07-09 MED ORDER — MORPHINE SULFATE (PF) 4 MG/ML IV SOLN
4.0000 mg | Freq: Once | INTRAVENOUS | Status: AC
  Administered 2022-07-09: 4 mg via INTRAVENOUS
  Filled 2022-07-09: qty 1

## 2022-07-09 MED ORDER — POTASSIUM CHLORIDE 10 MEQ/100ML IV SOLN
10.0000 meq | INTRAVENOUS | Status: AC
  Administered 2022-07-09: 10 meq via INTRAVENOUS
  Filled 2022-07-09 (×2): qty 100

## 2022-07-09 MED ORDER — LACTATED RINGERS IV BOLUS
2000.0000 mL | Freq: Once | INTRAVENOUS | Status: AC
  Administered 2022-07-09: 2000 mL via INTRAVENOUS

## 2022-07-09 MED ORDER — DICYCLOMINE HCL 20 MG PO TABS: 20.0000 mg | ORAL_TABLET | Freq: Two times a day (BID) | ORAL | 0 refills | Status: DC

## 2022-07-09 MED ORDER — HYDROMORPHONE HCL 1 MG/ML IJ SOLN
1.0000 mg | Freq: Once | INTRAMUSCULAR | Status: AC
  Administered 2022-07-09: 1 mg via INTRAVENOUS
  Filled 2022-07-09: qty 1

## 2022-07-09 MED ORDER — ONDANSETRON 4 MG PO TBDP: 4.0000 mg | ORAL_TABLET | Freq: Three times a day (TID) | ORAL | 0 refills | Status: DC | PRN

## 2022-07-09 NOTE — ED Provider Notes (Signed)
[facility] DEPT Provider Note   CSN: [id number] Arrival date & time: 07/09/22  1636     History  Chief Complaint  Patient presents with   Abdominal Pain   Emesis   Nausea    Jodi Mcgee is a 36 y.o. female.  36 year old female with past medical history that is significant for pancreatitis presents today for evaluation of epigastric abdominal pain with associated nausea and vomiting.  She also endorses as of today having some bright red blood in her emesis.  She denies blood in her stool.  Reports chronic diarrhea.  Denies chest pain, shortness of breath.  Endorses decreased p.o. intake.  Patient does have an emesis bag that is partially filled with emesis.  No evidence of coffee grounds or bright red blood noted.  The history is provided by the patient. No language interpreter was used.       Home Medications Prior to Admission medications   Medication Sig Start Date End Date Taking? Authorizing Provider  amLODipine (NORVASC) 5 MG tablet Take 1 tablet (5 mg total) by mouth daily. 10/28/21   Nita Sells, MD  blood glucose meter kit and supplies KIT Dispense based on patient and insurance preference. Use up to four times daily as directed. (FOR ICD-9 250.00, 250.01). 02/21/19   Oretha Milch D, MD  colestipol (COLESTID) 5 g granules Take 5 g by mouth 2 (two) times daily. 11/24/21 12/24/21  Evlyn Courier, PA-C  dicyclomine (BENTYL) 20 MG tablet Take 1 tablet (20 mg total) by mouth 2 (two) times daily. 10/02/21   Azucena Cecil, PA-C  dicyclomine (BENTYL) 20 MG tablet Take 1 tablet (20 mg total) by mouth 2 (two) times daily. 11/08/21   Valarie Merino, MD  feeding supplement, GLUCERNA SHAKE, (GLUCERNA SHAKE) LIQD Take 237 mLs by mouth 3 (three) times daily between meals. 11/05/21   [name], [name], DO  HUMULIN R 100 UNIT/ML injection Inject 20 Units into the skin 3 (three) times daily before meals. Per sliding scale 03/24/20   [provider]   Levonorgestrel (LILETTA) 19.5 MCG/DAY IUD IUD 1 each by Intrauterine route continuous. Placed in 2019    [provider]  lidocaine (LIDODERM) 5 % Place 1 patch onto the skin daily. Remove & Discard patch within 12 hours or as directed by MD 12/08/21   Tegeler, Gwenyth Allegra, MD  nicotine (NICODERM CQ - DOSED IN MG/24 HR) 7 mg/24hr patch Place 1 patch (7 mg total) onto the skin daily. 10/28/21   Nita Sells, MD  ondansetron (ZOFRAN ODT) 4 MG disintegrating tablet 4mg  ODT q4 hours prn nausea/vomit 05/11/22   Drenda Freeze, MD  oxyCODONE (OXY IR/ROXICODONE) 5 MG immediate release tablet Take 1 tablet (5 mg total) by mouth every 4 (four) hours as needed for severe pain. 10/28/21   Nita Sells, MD  pantoprazole (PROTONIX) 20 MG tablet Take 1 tablet (20 mg total) by mouth daily. 10/02/21   Azucena Cecil, PA-C  potassium chloride SA (KLOR-CON M) 20 MEQ tablet Take 2 tablets (40 mEq total) by mouth daily for 7 days. 05/11/22 05/18/22  Drenda Freeze, MD  promethazine (PHENERGAN) 25 MG tablet Take 1 tablet (25 mg total) by mouth every 6 (six) hours as needed for nausea or vomiting. 05/11/22   Pattricia Boss, MD  saccharomyces boulardii (FLORASTOR) 250 MG capsule Take 1 capsule (250 mg total) by mouth 2 (two) times daily. 11/05/21   [name], [name], DO      Allergies  Hydrocodone, Iodinated contrast media, Cephalexin, Cetirizine & related, Toradol [ketorolac tromethamine], Flagyl [metronidazole], and Nsaids    Review of Systems   Review of Systems  Constitutional:  Negative for chills and fever.  Respiratory:  Negative for shortness of breath.   Cardiovascular:  Negative for chest pain.  Gastrointestinal:  Positive for abdominal pain, diarrhea, nausea and vomiting.  Neurological:  Negative for light-headedness.  All other systems reviewed and are negative.   Physical Exam Updated Vital Signs BP (!) 159/106   Pulse 100   Temp 99.2 F (37.3 C) (Oral)   Resp 15    SpO2 100%  Physical Exam Vitals and nursing note reviewed.  Constitutional:      General: She is not in acute distress.    Appearance: Normal appearance. She is not ill-appearing.     Comments: Uncomfortable appearing on exam  HENT:     Head: Normocephalic and atraumatic.     Nose: Nose normal.  Eyes:     General: No scleral icterus.    Extraocular Movements: Extraocular movements intact.     Conjunctiva/sclera: Conjunctivae normal.  Cardiovascular:     Rate and Rhythm: Regular rhythm. Tachycardia present.     Pulses: Normal pulses.  Pulmonary:     Effort: Pulmonary effort is normal. No respiratory distress.     Breath sounds: Normal breath sounds. No wheezing or rales.  Abdominal:     General: There is no distension.     Tenderness: There is abdominal tenderness. There is no right CVA tenderness, left CVA tenderness or guarding.  Musculoskeletal:        General: Normal range of motion.     Cervical back: Normal range of motion.  Skin:    General: Skin is warm and dry.  Neurological:     General: No focal deficit present.     Mental Status: She is alert. Mental status is at baseline.     ED Results / Procedures / Treatments   Labs (all labs ordered are listed, but only abnormal results are displayed) Labs Reviewed  CBC WITH DIFFERENTIAL/PLATELET  COMPREHENSIVE METABOLIC PANEL  LIPASE, BLOOD  URINALYSIS, ROUTINE W REFLEX MICROSCOPIC  MAGNESIUM    EKG None  Radiology No results found.  Procedures Procedures    Medications Ordered in ED Medications  lactated ringers bolus 2,000 mL (2,000 mLs Intravenous New Bag/Given 07/09/22 1744)  morphine (PF) 4 MG/ML injection 4 mg (4 mg Intravenous Given 07/09/22 1744)  ondansetron (ZOFRAN) injection 4 mg (4 mg Intravenous Given 07/09/22 1743)    ED Course/ Medical Decision Making/ A&P Clinical Course as of 07/09/22 2125  Sun Jul 09, 2022  2010 CBC without leukocytosis or anemia.  CMP with mild hypokalemia of 3.2.   Consistent with history of vomiting.  Hemoglobin stable.  Doubt true hematemesis or significant blood loss.  Vital signs otherwise stable.  Magnesium of 1.9.  Lipase of 24.  CT abdomen pelvis without contrast without acute intra-abdominal process to explain patient's symptoms.  On reevaluation patient does have soft and nontender abdomen.  Still reporting significant nausea.  Will provide additional dose of pain medicine and Zofran and reevaluate. [AA]    Clinical Course User Index [AA] Evlyn Courier, PA-C                           Medical Decision Making Amount and/or Complexity of Data Reviewed Labs: ordered. Radiology: ordered.  Risk Prescription drug management.   Medical Decision Making /  ED Course   This patient presents to the ED for concern of abdominal pain, vomiting, this involves an extensive number of treatment options, and is a complaint that carries with it a high risk of complications and morbidity.  The differential diagnosis includes pancreatitis, gastroenteritis, appendicitis, cholecystitis  MDM: 36 year old female presents with above-mentioned complaints.  Uncomfortable appearing on exam however without acute distress.  Will obtain blood work, CT imaging, and provide IV fluids, pain control. On reevaluation patient reports improvement in symptoms.  CT imaging without acute intra-abdominal finding to explain patient's symptoms.  She does have chronic diarrhea.  Patient's symptoms most consistent with gastroenteritis.  No evidence of pancreatitis.  Zofran and Bentyl prescribed.  Patient voices understanding and is in agreement with plan.  Admission considered however not indicated at this time given patient had symptomatic improvement.  Work-up overall reassuring.  Mild hypokalemia.  Repleted in the emergency department tonight.   Lab Tests: -I ordered, reviewed, and interpreted labs.   The pertinent results include:   Labs Reviewed  CBC WITH DIFFERENTIAL/PLATELET -  Abnormal; Notable for the following components:      Result Value   Lymphs Abs 0.6 (*)    All other components within normal limits  COMPREHENSIVE METABOLIC PANEL - Abnormal; Notable for the following components:   Potassium 3.2 (*)    Glucose, Bld 129 (*)    All other components within normal limits  LIPASE, BLOOD  MAGNESIUM  URINALYSIS, ROUTINE W REFLEX MICROSCOPIC      EKG  EKG Interpretation  Date/Time:    Ventricular Rate:    PR Interval:    QRS Duration:   QT Interval:    QTC Calculation:   R Axis:     Text Interpretation:           Imaging Studies ordered: I ordered imaging studies including CT abdomen pelvis without contrast given contrast allergy I independently visualized and interpreted imaging. I agree with the radiologist interpretation   Medicines ordered and prescription drug management: Meds ordered this encounter  Medications   lactated ringers bolus 2,000 mL   morphine (PF) 4 MG/ML injection 4 mg   ondansetron (ZOFRAN) injection 4 mg   HYDROmorphone (DILAUDID) injection 1 mg   ondansetron (ZOFRAN) injection 4 mg   potassium chloride 10 mEq in 100 mL IVPB    -I have reviewed the patients home medicines and have made adjustments as needed  Cardiac Monitoring: The patient was maintained on a cardiac monitor.  I personally viewed and interpreted the cardiac monitored which showed an underlying rhythm of: Normal sinus rhythm  Reevaluation: After the interventions noted above, I reevaluated the patient and found that they have :improved  Co morbidities that complicate the patient evaluation  Past Medical History:  Diagnosis Date   Anemia    Anxiety    Blood transfusion without reported diagnosis    both CS   Cocaine abuse ([place])    Diabetes mellitus without complication (HCC)    GERD (gastroesophageal reflux disease)    has resolved   IBS (irritable bowel syndrome)    Ovarian cyst    Peritonitis, acute generalized ([place])    Pulmonary  embolism (HCC)    SBO (small bowel obstruction) ([place])    Sepsis ([place])       Dispostion: Patient is appropriate for discharge.  Discharged in stable condition.  Return precautions discussed.  Final Clinical Impression(s) / ED Diagnoses Final diagnoses:  Gastroenteritis    Rx / DC Orders ED Discharge Orders  Ordered    ondansetron (ZOFRAN-ODT) 4 MG disintegrating tablet  Every 8 hours PRN        07/09/22 2132    dicyclomine (BENTYL) 20 MG tablet  2 times daily        07/09/22 2132              Evlyn Courier, PA-C 07/09/22 2140    Godfrey Pick, MD 07/09/22 2150

## 2022-07-09 NOTE — Discharge Instructions (Addendum)
Your work-up today was reassuring.  No concerning cause of your abdominal pain.  You received IV medications in the emergency room with improvement in your symptoms.  You also received IV fluids.  Your potassium was slightly low at 3.2.  You received supplementation in the emergency room.  Follow-up with your primary care doctor to have your chemistry panel rechecked in about 1 week to ensure your potassium remains stable.  If you do not have a primary care doctor have attached information for [facility] for you to establish care with.  I have sent Bentyl and Zofran into the pharmacy for you for symptom management.

## 2022-07-09 NOTE — ED Triage Notes (Signed)
BIB EMS, Abd pain with N/V since last night, ? Blood in emesis.  138/92-100-98%-22 CBG 143

## 2022-08-23 ENCOUNTER — Encounter (HOSPITAL_COMMUNITY): Payer: Self-pay

## 2022-08-23 ENCOUNTER — Emergency Department (HOSPITAL_COMMUNITY)
Admission: EM | Admit: 2022-08-23 | Discharge: 2022-08-24 | Disposition: A | Discharge status: Home / Self Care | Payer: Medicaid Other | Attending: Emergency Medicine | Admitting: Emergency Medicine

## 2022-08-23 DIAGNOSIS — R1010 Upper abdominal pain, unspecified: Secondary | ICD-10-CM | POA: Diagnosis not present

## 2022-08-23 DIAGNOSIS — E119 Type 2 diabetes mellitus without complications: Secondary | ICD-10-CM | POA: Diagnosis not present

## 2022-08-23 DIAGNOSIS — R197 Diarrhea, unspecified: Secondary | ICD-10-CM | POA: Insufficient documentation

## 2022-08-23 DIAGNOSIS — R112 Nausea with vomiting, unspecified: Secondary | ICD-10-CM | POA: Diagnosis present

## 2022-08-23 LAB — CBG MONITORING, ED: Glucose-Capillary: 110 mg/dL — ABNORMAL HIGH (ref 70–99)

## 2022-08-23 LAB — COMPREHENSIVE METABOLIC PANEL
ALT: 15 U/L (ref 0–44)
AST: 14 U/L — ABNORMAL LOW (ref 15–41)
Albumin: 4.2 g/dL (ref 3.5–5.0)
Alkaline Phosphatase: 81 U/L (ref 38–126)
Anion gap: 10 (ref 5–15)
BUN: 18 mg/dL (ref 6–20)
CO2: 25 mmol/L (ref 22–32)
Calcium: 9.2 mg/dL (ref 8.9–10.3)
Chloride: 101 mmol/L (ref 98–111)
Creatinine, Ser: 0.6 mg/dL (ref 0.44–1.00)
GFR, Estimated: >60 mL/min (ref >60)
Glucose, Bld: 113 mg/dL — ABNORMAL HIGH (ref 70–99)
Potassium: 3.1 mmol/L — ABNORMAL LOW (ref 3.5–5.1)
Sodium: 136 mmol/L (ref 135–145)
Total Bilirubin: 1 mg/dL (ref 0.3–1.2)
Total Protein: 8.7 g/dL — ABNORMAL HIGH (ref 6.5–8.1)

## 2022-08-23 LAB — CBC WITH DIFFERENTIAL/PLATELET
Abs Immature Granulocytes: 0.02 10*3/uL (ref 0.00–0.07)
Basophils Absolute: 0.1 10*3/uL (ref 0.0–0.1)
Basophils Relative: 1 %
Eosinophils Absolute: 0.1 10*3/uL (ref 0.0–0.5)
Eosinophils Relative: 1 %
HCT: 46.3 % — ABNORMAL HIGH (ref 36.0–46.0)
Hemoglobin: 14.9 g/dL (ref 12.0–15.0)
Immature Granulocytes: 0 %
Lymphocytes Relative: 14 %
Lymphs Abs: 1.1 10*3/uL (ref 0.7–4.0)
MCH: 27.5 pg (ref 26.0–34.0)
MCHC: 32.2 g/dL (ref 30.0–36.0)
MCV: 85.4 fL (ref 80.0–100.0)
Monocytes Absolute: 0.4 10*3/uL (ref 0.1–1.0)
Monocytes Relative: 5 %
Neutro Abs: 5.8 10*3/uL (ref 1.7–7.7)
Neutrophils Relative %: 79 %
Platelets: 397 10*3/uL (ref 150–400)
RBC: 5.42 MIL/uL — ABNORMAL HIGH (ref 3.87–5.11)
RDW: 13.9 % (ref 11.5–15.5)
WBC: 7.4 10*3/uL (ref 4.0–10.5)
nRBC: 0 % (ref 0.0–0.2)

## 2022-08-23 LAB — LIPASE, BLOOD: Lipase: 35 U/L (ref 11–51)

## 2022-08-23 MED ORDER — DICYCLOMINE HCL 10 MG PO CAPS
10.0000 mg | ORAL_CAPSULE | Freq: Once | ORAL | Status: AC
  Administered 2022-08-23: 10 mg via ORAL
  Filled 2022-08-23: qty 1

## 2022-08-23 MED ORDER — ONDANSETRON 4 MG PO TBDP
4.0000 mg | ORAL_TABLET | Freq: Once | ORAL | Status: AC
  Administered 2022-08-23: 4 mg via ORAL
  Filled 2022-08-23: qty 1

## 2022-08-23 NOTE — ED Provider Triage Note (Signed)
Emergency Medicine Provider Triage Evaluation Note  Jodi Mcgee , a 36 y.o. female  was evaluated in triage.  Pt complains of abdominal pain. States she has had 3 days of constant abdominal pain that she describes as burning and stabbing primarily on the left side. Has had "uncontrollably" vomiting and diarrhea. Denies fever.  Review of Systems  Positive: See above Negative:   Physical Exam  BP (!) 137/98   Pulse (!) 102   Temp 98.2 F (36.8 C) (Oral)   Resp 18   SpO2 100%  Gen:   Awake, no distress   Resp:  Normal effort  MSK:   Moves extremities without difficulty  Other:  Abdomen is soft. TTP of the epigastrium and left side of the abdomen.   Medical Decision Making  Medically screening exam initiated at 6:50 PM.  Appropriate orders placed.  Jodi Mcgee was informed that the remainder of the evaluation will be completed by another provider, this initial triage assessment does not replace that evaluation, and the importance of remaining in the ED until their evaluation is complete.     Jodi Hillier, PA-C 08/23/22 1851

## 2022-08-23 NOTE — ED Triage Notes (Signed)
Pt arrived via EMS, c/o abd pain, n/v. States similar episode in september

## 2022-08-24 MED ORDER — OXYCODONE-ACETAMINOPHEN 5-325 MG PO TABS
1.0000 | ORAL_TABLET | Freq: Once | ORAL | Status: AC
  Administered 2022-08-24: 1 via ORAL
  Filled 2022-08-24: qty 1

## 2022-08-24 MED ORDER — SODIUM CHLORIDE 0.9 % IV BOLUS (SEPSIS)
1000.0000 mL | Freq: Once | INTRAVENOUS | Status: AC
  Administered 2022-08-24: 1000 mL via INTRAVENOUS

## 2022-08-24 MED ORDER — ONDANSETRON HCL 4 MG/2ML IJ SOLN
4.0000 mg | Freq: Once | INTRAMUSCULAR | Status: AC
  Administered 2022-08-24: 4 mg via INTRAVENOUS
  Filled 2022-08-24: qty 2

## 2022-08-24 NOTE — ED Notes (Signed)
PT state she is unable to give urine sample at this time.

## 2022-08-24 NOTE — ED Provider Notes (Signed)
[facility] DEPT Provider Note   CSN: [id number] Arrival date & time: 08/23/22  1731     History  Chief Complaint  Patient presents with   Abdominal Pain    Jodi Mcgee is a 36 y.o. female.  The history is provided by the patient.  Patient with extensive history including previous substance use, VTE, GERD, diabetes presents with presents with abdominal pain vomiting and diarrhea.  She reports she went to church on October 29 and since that time she has had multiple episodes of vomiting and diarrhea.  She has had these episodes previously.  She also ports upper abdominal pain.  The vomit and stool has been nonbloody.  No fevers but she does report chills.  She reports she has chronic pain meds at home, but is unable to keep fluids down.  She also reports having Zofran at home She has an extensive surgical history   Past Medical History:  Diagnosis Date   Anemia    Anxiety    Blood transfusion without reported diagnosis    both CS   Cocaine abuse ([place])    Diabetes mellitus without complication ([place])    GERD (gastroesophageal reflux disease)    has resolved   IBS (irritable bowel syndrome)    Ovarian cyst    Peritonitis, acute generalized ([place])    Pulmonary embolism ([facility])    SBO (small bowel obstruction) ([place])    Sepsis ([place])     Home Medications Prior to Admission medications   Medication Sig Start Date End Date Taking? Authorizing Provider  amLODipine (NORVASC) 5 MG tablet Take 1 tablet (5 mg total) by mouth daily. 10/28/21   Nita Sells, MD  blood glucose meter kit and supplies KIT Dispense based on patient and insurance preference. Use up to four times daily as directed. (FOR ICD-9 250.00, 250.01). 02/21/19   Oretha Milch D, MD  colestipol (COLESTID) 5 g granules Take 5 g by mouth 2 (two) times daily. 11/24/21 12/24/21  Evlyn Courier, PA-C  dicyclomine (BENTYL) 20 MG tablet Take 1 tablet (20 mg total) by mouth 2 (two) times daily.  07/09/22   Evlyn Courier, PA-C  feeding supplement, GLUCERNA SHAKE, (GLUCERNA SHAKE) LIQD Take 237 mLs by mouth 3 (three) times daily between meals. 11/05/21   [name], [name], DO  HUMULIN R 100 UNIT/ML injection Inject 20 Units into the skin 3 (three) times daily before meals. Per sliding scale 03/24/20   [provider]  Levonorgestrel (LILETTA) 19.5 MCG/DAY IUD IUD 1 each by Intrauterine route continuous. Placed in 2019    [provider]  lidocaine (LIDODERM) 5 % Place 1 patch onto the skin daily. Remove & Discard patch within 12 hours or as directed by MD 12/08/21   Tegeler, Gwenyth Allegra, MD  nicotine (NICODERM CQ - DOSED IN MG/24 HR) 7 mg/24hr patch Place 1 patch (7 mg total) onto the skin daily. 10/28/21   Nita Sells, MD  ondansetron (ZOFRAN-ODT) 4 MG disintegrating tablet Take 1 tablet (4 mg total) by mouth every 8 (eight) hours as needed. 07/09/22   Evlyn Courier, PA-C  oxyCODONE (OXY IR/ROXICODONE) 5 MG immediate release tablet Take 1 tablet (5 mg total) by mouth every 4 (four) hours as needed for severe pain. 10/28/21   Nita Sells, MD  pantoprazole (PROTONIX) 20 MG tablet Take 1 tablet (20 mg total) by mouth daily. 10/02/21   Azucena Cecil, PA-C  potassium chloride SA (KLOR-CON M) 20 MEQ tablet Take 2 tablets (40 mEq total) by  mouth daily for 7 days. 05/11/22 05/18/22  Drenda Freeze, MD  promethazine (PHENERGAN) 25 MG tablet Take 1 tablet (25 mg total) by mouth every 6 (six) hours as needed for nausea or vomiting. 05/11/22   Pattricia Boss, MD  saccharomyces boulardii (FLORASTOR) 250 MG capsule Take 1 capsule (250 mg total) by mouth 2 (two) times daily. 11/05/21   [name], [name], DO      Allergies    Hydrocodone, Iodinated contrast media, Cephalexin, Cetirizine & related, Toradol [ketorolac tromethamine], Flagyl [metronidazole], and Nsaids    Review of Systems   Review of Systems  Constitutional:  Positive for chills. Negative for fever.  Respiratory:  Negative  for shortness of breath.   Cardiovascular:  Negative for chest pain.  Gastrointestinal:  Positive for diarrhea and vomiting.    Physical Exam Updated Vital Signs BP 92/70   Pulse 90   Temp 98.2 F (36.8 C) (Oral)   Resp 18   SpO2 100%  Physical Exam CONSTITUTIONAL: Chronically ill-appearing, lying on her left side in a chair HEAD: Normocephalic/atraumatic EYES: EOMI/PERRL, no icterus ENMT: Mucous membranes moist NECK: supple no meningeal signs CV: S1/S2 noted, no murmurs/rubs/gallops noted LUNGS: Lungs are clear to auscultation bilaterally, no apparent distress ABDOMEN: soft, nontender, no rebound or guarding, bowel sounds noted throughout abdomen Well-healed scars are noted NEURO: Pt is awake/alert/appropriate, moves all extremitiesx4.  No facial droop.   EXTREMITIES: pulses normal/equal, full ROM SKIN: warm, color normal PSYCH: no abnormalities of mood noted, alert and oriented to situation  ED Results / Procedures / Treatments   Labs (all labs ordered are listed, but only abnormal results are displayed) Labs Reviewed  COMPREHENSIVE METABOLIC PANEL - Abnormal; Notable for the following components:      Result Value   Potassium 3.1 (*)    Glucose, Bld 113 (*)    Total Protein 8.7 (*)    AST 14 (*)    All other components within normal limits  CBC WITH DIFFERENTIAL/PLATELET - Abnormal; Notable for the following components:   RBC 5.42 (*)    HCT 46.3 (*)    All other components within normal limits  CBG MONITORING, ED - Abnormal; Notable for the following components:   Glucose-Capillary 110 (*)    All other components within normal limits  LIPASE, BLOOD    EKG None  Radiology No results found.  Procedures Procedures    Medications Ordered in ED Medications  ondansetron (ZOFRAN-ODT) disintegrating tablet 4 mg (4 mg Oral Given 08/23/22 1858)  dicyclomine (BENTYL) capsule 10 mg (10 mg Oral Given 08/23/22 1858)  sodium chloride 0.9 % bolus 1,000 mL (1,000 mLs  Intravenous New Bag/Given 08/24/22 0612)  ondansetron (ZOFRAN) injection 4 mg (4 mg Intravenous Given 08/24/22 0612)  oxyCODONE-acetaminophen (PERCOCET/ROXICET) 5-325 MG per tablet 1 tablet (1 tablet Oral Given 08/24/22 0631)    ED Course/ Medical Decision Making/ A&P Clinical Course as of 08/24/22 0704  Thu Aug 24, 2022  0550 Patient presents for recurrent abdominal pain and vomiting and diarrhea.  She has had extensive evaluations for this previously including multiple CT abdomen pelvis scans.  Patient requesting Dilaudid.  Overall labs are reassuring, but did have some tachycardia and a drop in her blood pressure which could be due to vomiting.  Plan to rehydrate and offer antiemetics. [DW]  [phone] Patient resting comfortably.  She reports she is due her home chronic oxycodone.  Will provide 1 dose here in the emergency department.  Will defer IV narcotics. [DW]  [phone] I have  low suspicion for acute abdominal emergency.  Will provide IV fluids after recent vomiting and diarrhea.  Vitals are improved, she will be safe for discharge and she can follow-up with her PCP or GI [DW]  [phone] Patient is improved.  Vital signs improving.  No vomiting in the emergency  department.  Will D/C home [DW]    Clinical Course User Index [DW] Ripley Fraise, MD                           Medical Decision Making Risk Prescription drug management.   This patient presents to the ED for concern of vomiting and diarrhea, this involves an extensive number of treatment options, and is a complaint that carries with it a high risk of complications and morbidity.  The differential diagnosis includes but is not limited to gastroenteritis, appendicitis, bowel obstruction, colitis  Comorbidities that complicate the patient evaluation: Patient's presentation is complicated by their history of chronic pain  Social Determinants of Health: Patient's  previous substance use disorder   increases the complexity of managing their  presentation  Additional history obtained: Records reviewed Care Everywhere/External Records  Lab Tests: I Ordered, and personally interpreted labs.  The pertinent results include: Labs are overall unremarkable   Medicines ordered and prescription drug management: I ordered medication including IV fluids and Zofran for nausea and vomiting Reevaluation of the patient after these medicines showed that the patient    improved  Test Considered: Considered CT abdomen pelvis with patient's had multiple imaging modalities previously and has a history of chronic pain  Critical Interventions:   IV fluids   Reevaluation: After the interventions noted above, I reevaluated the patient and found that they have :improved  Complexity of problems addressed: Patient's presentation is most consistent with  acute presentation with potential threat to life or bodily function  Disposition: After consideration of the diagnostic results and the patient's response to treatment,  I feel that the patent would benefit from discharge   .           Final Clinical Impression(s) / ED Diagnoses Final diagnoses:  Nausea vomiting and diarrhea    Rx / DC Orders ED Discharge Orders     None         Ripley Fraise, MD 08/24/22 [phone]

## 2022-08-24 NOTE — Discharge Instructions (Signed)
Please follow-up with your doctor for your recurrent abdominal pain and vomiting diarrhea

## 2022-11-07 NOTE — Progress Notes (Signed)
 Patient: Jodi Mcgee     DOB: 02-May-1986, age 37 y.o. MRN: [id number]  PCP: Bernita Ned, PA-C   Assessment and Plan  1. Annual physical exam (Primary) Comments: Health maintenance reviewed with patient today. Referral to OBGYN for cervical and breast cancer screening, per patient preference. Orders: -     CBC And Differential; Future; Expected date: 11/07/2022 -     Urinalysis; Future; Expected date: 11/07/2022 2. Type 2 diabetes mellitus with other specified complication, without long-term current use of insulin  (*) Assessment & Plan:  Well controlled per last A1c. Treatment pending results of lab work from today. Advised patient of importance of scheduling eye appointment annually, she understood and said she will schedule one soon.  Orders: -     POCT A1C; Future -     Urinalysis; Future; Expected date: 11/07/2022 -     POCT ACR URINE; Future 3. Type 2 diabetes mellitus with diabetic polyneuropathy, without long-term current use of insulin  (*) Assessment & Plan: Well-controlled. Diabetic foot exam performed today and was normal. Orders: -     POCT A1C; Future -     Urinalysis; Future; Expected date: 11/07/2022 -     POCT ACR URINE; Future 4. Hyperlipidemia associated with type 2 diabetes mellitus (*) Assessment & Plan: Well controlled per last lipid panel a year ago. Will recheck levels today and decide treatment plan based on results.   Orders: -     Lipid Panel With LDL/HDL Ratio; Future; Expected date: 11/14/2022 5. Intractable abdominal pain Assessment & Plan: Referral to Gastroenterology per patient request. Will refill Zofran  and Dicyclomine .  Orders: -     Ambulatory referral to Gastroenterology -     ondansetron  (ZOFRAN -ODT) 8 mg disintegrating tablet; Take one tablet (8 mg dose) by mouth every 8 (eight) hours as needed for Nausea for up to 7 days., Starting Tue 11/07/2022, Until Tue 11/14/2022 at 2359, Normal 6. Colitis Assessment & Plan: Dicyclomine   continues to help colitis symptoms when patient consistently takes medication. Will continue medication. Orders: -     Ambulatory referral to Gastroenterology -     dicyclomine  (BENTYL ) 10 mg capsule; Take ten mg by mouth 4 (four) times daily., Starting Tue 11/07/2022, Normal 7. Chronic low back pain without sciatica, unspecified back pain laterality Assessment & Plan: Patient continues to have chronic low back pain. Currently seeing Orthopedics for the issue, is scheduling another appointment with them soon. Will refill Percocet to help with pain. Patient signed controlled opiate agreement.  Orders: -     oxyCODONE -acetaminophen  (PERCOCET,ENDOCET) 5-325 mg per tablet; Take one tablet by mouth every 4 (four) hours as needed for Pain. Max Daily Amount: 6 tablets, Starting Tue 11/07/2022, Until Wed 11/07/2023 at 2359, Normal 8. Spondylosis of lumbar region without myelopathy or radiculopathy Assessment & Plan: Seeing orthopedics for the issue. Continue current treatment plan.  9. Chronic prescription opiate use Comments: PDMP reviewed. Opiooid prescribing agreement updated today. Orders: -     POCT UDS 12+Bup; Future; Expected date: 11/07/2022 10. Adrenal nodule (*) Assessment & Plan: Follow-up consistent with benign adenoma. No additional evaluation indicated.  11. Screening for cervical cancer Comments: patient prefers referral to OBGYN. 12. Dermatitis Comments: Triamcinolone  continues to help with dermaitis. Refill ointment and continue treatment. Orders: -     triamcinolone  (KENALOG ) 0.1% ointment; Apply topically 2 (two) times daily., Starting Tue 11/07/2022, Until Wed 11/07/2023, Normal 13. Positive urine drug screen Comments: routine annual UDS unexpectedly positive for cocaine. Sent for confirmatory testing. Orders: -  Cocaine and Mtb, ToxAssure, UR; Future Other orders -     Ambulatory referral to Obstetrics / Gynecology     Follow up in about 3 months (around 02/06/2023)  for re-evaluation of diabetes, cholesterol.  Risks, benefits, and alternatives of the medications and treatment plan prescribed today were discussed, and patient expressed understanding. Plan follow-up as discussed or as needed if any worsening symptoms or change in condition.  Patient voiced understanding of the treatment plan and agreed to attempt to comply.      Subjective   This is a 37 y.o. female who presents with:     Patient presents with  . Annual Exam     Chief complaint comments reviewed and discussed with patient in detail.  HPI  This 37 y.o. female presents for Annual Exam. Our last visit together was by telemedicine 04/10/2022. She was seen in the ED 9/17 and 08/23/2022 for GI symptoms.  Cervical Cancer Screening: Last pap 01/2018. History of previous abnormal pap? No. Breast Cancer Screening: Last mammogram Not due, . SBE? Yes. Annual CBE? yes. Colorectal Cancer Screening: Not due Bone Density Testing: Not due HIV Screening: Declined STI Screening: Declined COVID-19 Vaccination: Declined Seasonal Influenza Vaccination: Declined Td/Tdap Vaccination: UTD Pneumococcal Vaccination: Declined Zoster Vaccination: Not due Frequency of Dental evaluation: Q6 months Frequency of Eye evaluation: recommend annually  She has a past medical history significant for chronic lower back pain, colitis and chronic diarrhea. Today she is having low back pain and abdominal pain.  Patient states that her low back has not changed since last visit. She currently sees orthopedics for her back pain. She said she received epidural injection to help with pain but it didn't last long. Denies any new symptoms. She has not followed up since procedure 05/17/2022, and continues oxycodone  Q4 hours prn.  Patient has history of chronic diarrhea and abdominal pain. She says her symptoms have gotten better as she has started to more consistently take the Dicyclomine . Stool remains very soft, but is less  liquid. She still has trouble with urgency of BM. She does report she has been able to eat more recently and has gained 19 pounds since last visit. She reports no new or worsening symptoms. She has not seen GI since 11/2021, per chart review.   Review of Systems  Constitutional:  Negative for fatigue, fever and unexpected weight change.  HENT:  Negative for congestion, ear pain, hearing loss, rhinorrhea and sore throat.   Eyes:  Positive for visual disturbance (noticed wrose vision at night). Negative for photophobia.  Respiratory:  Negative for cough, chest tightness, shortness of breath and wheezing.   Cardiovascular:  Negative for chest pain, palpitations and leg swelling.  Gastrointestinal:  Positive for abdominal pain, diarrhea and nausea. Negative for constipation and vomiting.  Endocrine: Negative.   Genitourinary: Negative.   Musculoskeletal:  Positive for back pain and myalgias. Negative for arthralgias, gait problem, joint swelling, neck pain and neck stiffness.  Neurological:  Negative for dizziness, weakness, light-headedness, numbness and headaches.  Psychiatric/Behavioral:  Negative for dysphoric mood and suicidal ideas. The patient is not nervous/anxious.         Patient Active Problem List  Diagnosis  . Chronic pain syndrome  . Adrenal nodule (*)  . Cysts of both ovaries  . Type 2 diabetes mellitus with other specified complication (*)  . Hyperlipidemia associated with type 2 diabetes mellitus (*)  . Pelvic adhesive disease  . IUD contraception  . Hypokalemia  . Intractable abdominal pain  .  GERD (gastroesophageal reflux disease)  . Chronic low back pain without sciatica  . Type 2 diabetes mellitus with diabetic polyneuropathy, without long-term current use of insulin  (*)  . Diarrhea  . Spondylosis of lumbar region without myelopathy or radiculopathy  . Colitis    Past Medical History:  Diagnosis Date  . Bilateral ovarian cysts   . Blood per rectum 02/20/2019  .  DKA (diabetic ketoacidoses) (*) 02/20/2019  . Hyperosmolar hyperglycemic state (HHS) (*) 11/09/2019  . Hypertension in pregnancy, preeclampsia, severe, delivered/postpartum 09/13/2017  . Irritable bowel syndrome   . Partial small bowel obstruction (*) 05/09/2020  . Peritonitis (*)   . Pyelonephritis 03/21/2021  . Sepsis (*)   . Small bowel obstruction (*)     Allergies  Allergen Reactions  . Cetirizine & Related Itching, Other and Swelling  . Hydrocodone  Itching  . Iodinated Diagnostic Agents Itching and Other    Mild itching after IV contrast on 06/01/2017No urticaria visible, wheezing, or angioedema  . Ketorolac  Tromethamine  Hives, Itching and Other  . Metronidazole  Itching and Swelling  . Cephalexin  Hives  . Nsaids Rash  . Tolmetin Rash    None per gi  . Tramadol  Hives    Past Surgical History:  Procedure Laterality Date  . Appendectomy    . Colon surgery    . Tooth extraction Bilateral 02/04/2020   top and bottom    Family History  Problem Relation Age of Onset  . Colon cancer Neg Hx   . Colon polyps Neg Hx     Social History   Socioeconomic History  . Marital status: Single  Tobacco Use  . Smoking status: Some Days    Packs/day: 0.50    Years: 10.00    Additional pack years: 0.00    Total pack years: 5.00    Types: Cigarettes    Passive exposure: Never  . Smokeless tobacco: Never  Vaping Use  . Vaping Use: Never used  Substance and Sexual Activity  . Alcohol use: Not Currently  . Drug use: No  . Sexual activity: Yes    Partners: Male      Objective   Vitals:   11/07/22 1439  BP: 114/64  Patient Position: Sitting  Pulse: 97  Temp: 97.3 F (36.3 C)  TempSrc: Temporal  Height: 5' 7 (1.702 m)  Weight: 163 lb (73.9 kg)  SpO2: 97%  BMI (Calculated): 25.5  PainSc:   8  PainLoc: Back   Wt Readings from Last 3 Encounters:  11/07/22 163 lb (73.9 kg)  03/28/22 144 lb (65.3 kg)  02/13/22 144 lb (65.3 kg)    Last Resulted Components      Date/Time Component Value Flag Units Reference Range Lab Status   11/21/21 1440 CHOL 119 -- mg/dL 899 - 800 mg/dL Final result   98/69/76 1440 HDL 31* Low mg/dL >60 mg/dL Final result   98/69/76 1440 LDL 69 -- mg/dL 0 - 99 mg/dL Final result   98/69/76 1440 TRIG 102 -- mg/dL 0 - 850 mg/dL Final result   95/75/76 1538 HGBA1C 6.1* Abnormal % 4.8 - 5.6 % Final result   02/13/22 1537 GLUCOSE 75 -- mg/dL 70 - 99 mg/dL Final result     Diabetic foot exam:  Left: Monofilament test: Sensation normal  Pulses: present  Skin: Normal and no erythema, no cyanosis or pallor   Other findings: none Right: Monofilament test: Sensation normal  Pulses: present  Skin: Normal and no erythema, no cyanosis or pallor   Other findings: none  Exam performed with shoes and socks removed.  Physical Exam Constitutional:      General: She is not in acute distress.    Appearance: Normal appearance. She is not ill-appearing.  HENT:     Head: Normocephalic and atraumatic.     Right Ear: Tympanic membrane, ear canal and external ear normal.     Left Ear: Tympanic membrane, ear canal and external ear normal.     Nose: Nose normal.     Mouth/Throat:     Mouth: Mucous membranes are moist.     Pharynx: Oropharynx is clear. No oropharyngeal exudate or posterior oropharyngeal erythema.  Eyes:     Conjunctiva/sclera: Conjunctivae normal.     Pupils: Pupils are equal, round, and reactive to light.  Cardiovascular:     Rate and Rhythm: Normal rate and regular rhythm.     Pulses: Normal pulses.     Heart sounds: Normal heart sounds. No murmur heard.    No friction rub. No gallop.  Musculoskeletal:        General: No swelling. Normal range of motion.     Cervical back: Normal range of motion. No tenderness.  Pulmonary:     Effort: Pulmonary effort is normal.     Breath sounds: Normal breath sounds. No wheezing, rhonchi or rales.  Abdominal:     General: Bowel sounds are normal.     Palpations: Abdomen is soft.  There is no mass.     Tenderness: There is abdominal tenderness (general). There is no guarding.  Lymphadenopathy:     Cervical: No cervical adenopathy.  Neurological:     Mental Status: She is alert and oriented to person, place, and time.  Psychiatric:        Mood and Affect: Mood normal.        Behavior: Behavior normal.      Patient history and PE also performed by clinical preceptor separately.  Any diagnostic tests and/or x-rays performed or reviewed by clinical preceptor personally.  Jodi Ka, PA-S   Patients history and PE also performed by me separately from the student. Documentation reviewed for appropriateness and completeness.  Chart review completed based on requirements for oversight of student.  Any diagnostic tests and/or x-rays performed or reviewed by me personally.  I have reviewed the student's documentation of the Past Medical, Family, and Social History [facility]) and Review of Systems (ROS), exam and/or medical decision making, and based on my exam and medical decision making, agree.    Bernita CANDIE Ned, PA-C      *Some images could not be shown.

## 2022-11-15 ENCOUNTER — Emergency Department (HOSPITAL_BASED_OUTPATIENT_CLINIC_OR_DEPARTMENT_OTHER): Payer: Medicaid Other

## 2022-11-15 ENCOUNTER — Other Ambulatory Visit: Payer: Self-pay

## 2022-11-15 ENCOUNTER — Encounter (HOSPITAL_BASED_OUTPATIENT_CLINIC_OR_DEPARTMENT_OTHER): Payer: Self-pay

## 2022-11-15 ENCOUNTER — Emergency Department (HOSPITAL_BASED_OUTPATIENT_CLINIC_OR_DEPARTMENT_OTHER)
Admission: EM | Admit: 2022-11-15 | Discharge: 2022-11-16 | Disposition: A | Discharge status: Home / Self Care | Payer: Medicaid Other | Attending: Emergency Medicine | Admitting: Emergency Medicine

## 2022-11-15 DIAGNOSIS — E119 Type 2 diabetes mellitus without complications: Secondary | ICD-10-CM | POA: Insufficient documentation

## 2022-11-15 DIAGNOSIS — R109 Unspecified abdominal pain: Secondary | ICD-10-CM | POA: Diagnosis present

## 2022-11-15 DIAGNOSIS — Z794 Long term (current) use of insulin: Secondary | ICD-10-CM | POA: Diagnosis not present

## 2022-11-15 DIAGNOSIS — R1084 Generalized abdominal pain: Secondary | ICD-10-CM | POA: Insufficient documentation

## 2022-11-15 LAB — URINALYSIS, ROUTINE W REFLEX MICROSCOPIC
Bilirubin Urine: NEGATIVE
Glucose, UA: NEGATIVE mg/dL
Hgb urine dipstick: NEGATIVE
Leukocytes,Ua: NEGATIVE
Nitrite: POSITIVE — AB
Protein, ur: 30 mg/dL — AB
Specific Gravity, Urine: 1.031 — ABNORMAL HIGH (ref 1.005–1.030)
pH: 6 (ref 5.0–8.0)

## 2022-11-15 LAB — CBC
HCT: 44.5 % (ref 36.0–46.0)
Hemoglobin: 14.7 g/dL (ref 12.0–15.0)
MCH: 27.9 pg (ref 26.0–34.0)
MCHC: 33 g/dL (ref 30.0–36.0)
MCV: 84.4 fL (ref 80.0–100.0)
Platelets: 408 10*3/uL — ABNORMAL HIGH (ref 150–400)
RBC: 5.27 MIL/uL — ABNORMAL HIGH (ref 3.87–5.11)
RDW: 13.9 % (ref 11.5–15.5)
WBC: 9.5 10*3/uL (ref 4.0–10.5)
nRBC: 0 % (ref 0.0–0.2)

## 2022-11-15 LAB — COMPREHENSIVE METABOLIC PANEL
ALT: 10 U/L (ref 0–44)
AST: 11 U/L — ABNORMAL LOW (ref 15–41)
Albumin: 4.8 g/dL (ref 3.5–5.0)
Alkaline Phosphatase: 76 U/L (ref 38–126)
Anion gap: 11 (ref 5–15)
BUN: 12 mg/dL (ref 6–20)
CO2: 29 mmol/L (ref 22–32)
Calcium: 10.3 mg/dL (ref 8.9–10.3)
Chloride: 96 mmol/L — ABNORMAL LOW (ref 98–111)
Creatinine, Ser: 0.68 mg/dL (ref 0.44–1.00)
GFR, Estimated: >60 mL/min (ref >60)
Glucose, Bld: 94 mg/dL (ref 70–99)
Potassium: 3.3 mmol/L — ABNORMAL LOW (ref 3.5–5.1)
Sodium: 136 mmol/L (ref 135–145)
Total Bilirubin: 0.6 mg/dL (ref 0.3–1.2)
Total Protein: 8.9 g/dL — ABNORMAL HIGH (ref 6.5–8.1)

## 2022-11-15 LAB — LIPASE, BLOOD: Lipase: 15 U/L (ref 11–51)

## 2022-11-15 LAB — PREGNANCY, URINE: Preg Test, Ur: NEGATIVE

## 2022-11-15 MED ORDER — ONDANSETRON 4 MG PO TBDP
8.0000 mg | ORAL_TABLET | Freq: Once | ORAL | Status: AC
  Administered 2022-11-16: 8 mg via ORAL
  Filled 2022-11-15: qty 2

## 2022-11-15 MED ORDER — ONDANSETRON HCL 4 MG/2ML IJ SOLN
4.0000 mg | Freq: Once | INTRAMUSCULAR | Status: DC
  Filled 2022-11-15: qty 2

## 2022-11-15 MED ORDER — MORPHINE SULFATE (PF) 4 MG/ML IV SOLN
4.0000 mg | Freq: Once | INTRAVENOUS | Status: DC
  Filled 2022-11-15: qty 1

## 2022-11-15 MED ORDER — SODIUM CHLORIDE 0.9 % IV BOLUS: 1000.0000 mL | Freq: Once | INTRAVENOUS | Status: DC

## 2022-11-15 MED ORDER — HYDROMORPHONE HCL 1 MG/ML IJ SOLN
2.0000 mg | Freq: Once | INTRAMUSCULAR | Status: AC
  Administered 2022-11-16: 2 mg via INTRAMUSCULAR
  Filled 2022-11-15: qty 2

## 2022-11-15 NOTE — ED Triage Notes (Signed)
Patient here POV from Home.  Endorses ABD Pain that began approximately 6 Days ago. Subsided somewhat but returned worse Sunday.   Some N/V/D. Mid ABD Pain and radiates to R side.   NAD Noted during Triage. A&Ox4. GCS 15. Ambulatory.

## 2022-11-15 NOTE — ED Provider Notes (Signed)
[facility] Provider Note   CSN: [id number] Arrival date & time: 11/15/22  1825     History  Chief Complaint  Patient presents with   Abdominal Pain    Jodi Mcgee is a 37 y.o. female.  Patient is a 37 year old female with history of chronic abdominal pain, type 2 diabetes.  Patient presenting today for evaluation of abdominal pain.  She describes generalized abdominal pain for the past several days.  This began in the absence of any injury or trauma.  She describes constant pain to the mid abdomen that radiates to her right side.  She denies any fevers or chills.  She denies any bowel or bladder complaints.  Pain is worse with movement, palpation, and eating.  She denies any alleviating factors.  Patient well-known to me from previous visits involving unexplained abdominal pain.  She tells me she has been seen by a GI doctor and has had colonoscopy and endoscopy but no cause has been found.  The history is provided by the patient.       Home Medications Prior to Admission medications   Medication Sig Start Date End Date Taking? Authorizing Provider  amLODipine (NORVASC) 5 MG tablet Take 1 tablet (5 mg total) by mouth daily. 10/28/21   Nita Sells, MD  blood glucose meter kit and supplies KIT Dispense based on patient and insurance preference. Use up to four times daily as directed. (FOR ICD-9 250.00, 250.01). 02/21/19   Oretha Milch D, MD  colestipol (COLESTID) 5 g granules Take 5 g by mouth 2 (two) times daily. 11/24/21 12/24/21  Evlyn Courier, PA-C  dicyclomine (BENTYL) 20 MG tablet Take 1 tablet (20 mg total) by mouth 2 (two) times daily. 07/09/22   Evlyn Courier, PA-C  feeding supplement, GLUCERNA SHAKE, (GLUCERNA SHAKE) LIQD Take 237 mLs by mouth 3 (three) times daily between meals. 11/05/21   [name], [name], DO  HUMULIN R 100 UNIT/ML injection Inject 20 Units into the skin 3 (three) times daily before meals. Per sliding scale 03/24/20    [provider]  Levonorgestrel (LILETTA) 19.5 MCG/DAY IUD IUD 1 each by Intrauterine route continuous. Placed in 2019    [provider]  lidocaine (LIDODERM) 5 % Place 1 patch onto the skin daily. Remove & Discard patch within 12 hours or as directed by MD 12/08/21   Tegeler, Gwenyth Allegra, MD  nicotine (NICODERM CQ - DOSED IN MG/24 HR) 7 mg/24hr patch Place 1 patch (7 mg total) onto the skin daily. 10/28/21   Nita Sells, MD  ondansetron (ZOFRAN-ODT) 4 MG disintegrating tablet Take 1 tablet (4 mg total) by mouth every 8 (eight) hours as needed. 07/09/22   Evlyn Courier, PA-C  oxyCODONE (OXY IR/ROXICODONE) 5 MG immediate release tablet Take 1 tablet (5 mg total) by mouth every 4 (four) hours as needed for severe pain. 10/28/21   Nita Sells, MD  pantoprazole (PROTONIX) 20 MG tablet Take 1 tablet (20 mg total) by mouth daily. 10/02/21   Azucena Cecil, PA-C  potassium chloride SA (KLOR-CON M) 20 MEQ tablet Take 2 tablets (40 mEq total) by mouth daily for 7 days. 05/11/22 05/18/22  Drenda Freeze, MD  promethazine (PHENERGAN) 25 MG tablet Take 1 tablet (25 mg total) by mouth every 6 (six) hours as needed for nausea or vomiting. 05/11/22   Pattricia Boss, MD  saccharomyces boulardii (FLORASTOR) 250 MG capsule Take 1 capsule (250 mg total) by mouth 2 (two) times daily. 11/05/21  [name], [name], DO      Allergies    Hydrocodone, Iodinated contrast media, Cephalexin, Cetirizine & related, Toradol [ketorolac tromethamine], Flagyl [metronidazole], and Nsaids    Review of Systems   Review of Systems  All other systems reviewed and are negative.   Physical Exam Updated Vital Signs BP (!) 150/101 (BP Location: Right Arm)   Pulse (!) 106   Temp 97.9 F (36.6 C) (Oral)   Resp 18   Ht '5\' 7"'$  (1.702 m)   Wt 67.1 kg   SpO2 99%   BMI 23.17 kg/m  Physical Exam Vitals and nursing note reviewed.  Constitutional:      General: She is not in acute distress.    Appearance:  She is well-developed. She is not diaphoretic.  HENT:     Head: Normocephalic and atraumatic.  Cardiovascular:     Rate and Rhythm: Normal rate and regular rhythm.     Heart sounds: No murmur heard.    No friction rub. No gallop.  Pulmonary:     Effort: Pulmonary effort is normal. No respiratory distress.     Breath sounds: Normal breath sounds. No wheezing.  Abdominal:     General: Bowel sounds are normal. There is no distension.     Palpations: Abdomen is soft.     Tenderness: There is generalized abdominal tenderness. There is no right CVA tenderness, left CVA tenderness, guarding or rebound.  Musculoskeletal:        General: Normal range of motion.     Cervical back: Normal range of motion and neck supple.  Skin:    General: Skin is warm and dry.  Neurological:     General: No focal deficit present.     Mental Status: She is alert and oriented to person, place, and time.     ED Results / Procedures / Treatments   Labs (all labs ordered are listed, but only abnormal results are displayed) Labs Reviewed  COMPREHENSIVE METABOLIC PANEL - Abnormal; Notable for the following components:      Result Value   Potassium 3.3 (*)    Chloride 96 (*)    Total Protein 8.9 (*)    AST 11 (*)    All other components within normal limits  CBC - Abnormal; Notable for the following components:   RBC 5.27 (*)    Platelets 408 (*)    All other components within normal limits  URINALYSIS, ROUTINE W REFLEX MICROSCOPIC - Abnormal; Notable for the following components:   APPearance HAZY (*)    Specific Gravity, Urine 1.031 (*)    Ketones, ur TRACE (*)    Protein, ur 30 (*)    Nitrite POSITIVE (*)    Bacteria, UA RARE (*)    All other components within normal limits  LIPASE, BLOOD  PREGNANCY, URINE    EKG None  Radiology No results found.  Procedures Procedures  {Document cardiac monitor, telemetry assessment procedure when appropriate:1}  Medications Ordered in ED Medications   sodium chloride 0.9 % bolus 1,000 mL (has no administration in time range)  ondansetron (ZOFRAN) injection 4 mg (has no administration in time range)  morphine (PF) 4 MG/ML injection 4 mg (has no administration in time range)    ED Course/ Medical Decision Making/ A&P   {   Click here for ABCD2, HEART and other calculatorsREFRESH Note before signing :1}  Medical Decision Making Amount and/or Complexity of Data Reviewed Labs: ordered. Radiology: ordered.  Risk Prescription drug management.   ***  {Document critical care time when appropriate:1} {Document review of labs and clinical decision tools ie heart score, Chads2Vasc2 etc:1}  {Document your independent review of radiology images, and any outside records:1} {Document your discussion with family members, caretakers, and with consultants:1} {Document social determinants of health affecting pt's care:1} {Document your decision making why or why not admission, treatments were needed:1} Final Clinical Impression(s) / ED Diagnoses Final diagnoses:  None    Rx / DC Orders ED Discharge Orders     None

## 2022-11-16 NOTE — Discharge Instructions (Signed)
Follow-up with your gastroenterologist to discuss your symptoms.

## 2023-04-03 ENCOUNTER — Emergency Department (HOSPITAL_COMMUNITY): Payer: Medicaid Other

## 2023-04-03 ENCOUNTER — Other Ambulatory Visit: Payer: Self-pay

## 2023-04-03 ENCOUNTER — Emergency Department (HOSPITAL_COMMUNITY)
Admission: EM | Admit: 2023-04-03 | Discharge: 2023-04-04 | Disposition: A | Discharge status: Home / Self Care | Payer: Medicaid Other | Attending: Emergency Medicine | Admitting: Emergency Medicine

## 2023-04-03 ENCOUNTER — Encounter (HOSPITAL_COMMUNITY): Payer: Self-pay

## 2023-04-03 DIAGNOSIS — E119 Type 2 diabetes mellitus without complications: Secondary | ICD-10-CM | POA: Insufficient documentation

## 2023-04-03 DIAGNOSIS — E876 Hypokalemia: Secondary | ICD-10-CM | POA: Insufficient documentation

## 2023-04-03 DIAGNOSIS — K2920 Alcoholic gastritis without bleeding: Secondary | ICD-10-CM

## 2023-04-03 DIAGNOSIS — N83209 Unspecified ovarian cyst, unspecified side: Secondary | ICD-10-CM | POA: Diagnosis not present

## 2023-04-03 DIAGNOSIS — R1084 Generalized abdominal pain: Secondary | ICD-10-CM | POA: Diagnosis present

## 2023-04-03 LAB — CBC WITH DIFFERENTIAL/PLATELET
Abs Immature Granulocytes: 0.02 10*3/uL (ref 0.00–0.07)
Basophils Absolute: 0 10*3/uL (ref 0.0–0.1)
Basophils Relative: 0 %
Eosinophils Absolute: 0 10*3/uL (ref 0.0–0.5)
Eosinophils Relative: 0 %
HCT: 43.4 % (ref 36.0–46.0)
Hemoglobin: 13.5 g/dL (ref 12.0–15.0)
Immature Granulocytes: 0 %
Lymphocytes Relative: 8 %
Lymphs Abs: 0.5 10*3/uL — ABNORMAL LOW (ref 0.7–4.0)
MCH: 27.7 pg (ref 26.0–34.0)
MCHC: 31.1 g/dL (ref 30.0–36.0)
MCV: 88.9 fL (ref 80.0–100.0)
Monocytes Absolute: 0.2 10*3/uL (ref 0.1–1.0)
Monocytes Relative: 2 %
Neutro Abs: 5.5 10*3/uL (ref 1.7–7.7)
Neutrophils Relative %: 90 %
Platelets: 305 10*3/uL (ref 150–400)
RBC: 4.88 MIL/uL (ref 3.87–5.11)
RDW: 13.2 % (ref 11.5–15.5)
WBC: 6.2 10*3/uL (ref 4.0–10.5)
nRBC: 0 % (ref 0.0–0.2)

## 2023-04-03 LAB — URINALYSIS, ROUTINE W REFLEX MICROSCOPIC
Bilirubin Urine: NEGATIVE
Glucose, UA: NEGATIVE mg/dL
Hgb urine dipstick: NEGATIVE
Ketones, ur: 20 mg/dL — AB
Leukocytes,Ua: NEGATIVE
Nitrite: NEGATIVE
Protein, ur: 100 mg/dL — AB
Specific Gravity, Urine: 1.014 (ref 1.005–1.030)
pH: 7 (ref 5.0–8.0)

## 2023-04-03 LAB — COMPREHENSIVE METABOLIC PANEL
ALT: 17 U/L (ref 0–44)
AST: 20 U/L (ref 15–41)
Albumin: 3.8 g/dL (ref 3.5–5.0)
Alkaline Phosphatase: 88 U/L (ref 38–126)
Anion gap: 13 (ref 5–15)
BUN: 7 mg/dL (ref 6–20)
CO2: 22 mmol/L (ref 22–32)
Calcium: 9.1 mg/dL (ref 8.9–10.3)
Chloride: 99 mmol/L (ref 98–111)
Creatinine, Ser: 0.68 mg/dL (ref 0.44–1.00)
GFR, Estimated: >60 mL/min (ref >60)
Glucose, Bld: 128 mg/dL — ABNORMAL HIGH (ref 70–99)
Potassium: 3.4 mmol/L — ABNORMAL LOW (ref 3.5–5.1)
Sodium: 134 mmol/L — ABNORMAL LOW (ref 135–145)
Total Bilirubin: 0.6 mg/dL (ref 0.3–1.2)
Total Protein: 8 g/dL (ref 6.5–8.1)

## 2023-04-03 LAB — PREGNANCY, URINE: Preg Test, Ur: NEGATIVE

## 2023-04-03 LAB — LIPASE, BLOOD: Lipase: 24 U/L (ref 11–51)

## 2023-04-03 MED ORDER — ONDANSETRON HCL 4 MG/2ML IJ SOLN
4.0000 mg | Freq: Once | INTRAMUSCULAR | Status: AC
  Administered 2023-04-03: 4 mg via INTRAVENOUS
  Filled 2023-04-03: qty 2

## 2023-04-03 MED ORDER — HYDROMORPHONE HCL 1 MG/ML IJ SOLN
1.0000 mg | Freq: Once | INTRAMUSCULAR | Status: AC
  Administered 2023-04-03: 1 mg via INTRAVENOUS
  Filled 2023-04-03: qty 1

## 2023-04-03 MED ORDER — SODIUM CHLORIDE 0.9 % IV BOLUS
1000.0000 mL | Freq: Once | INTRAVENOUS | Status: AC
  Administered 2023-04-03: 1000 mL via INTRAVENOUS

## 2023-04-03 MED ORDER — PANTOPRAZOLE SODIUM 20 MG PO TBEC: 20.0000 mg | DELAYED_RELEASE_TABLET | Freq: Every day | ORAL | 0 refills | Status: DC

## 2023-04-03 MED ORDER — ALUM & MAG HYDROXIDE-SIMETH 200-200-20 MG/5ML PO SUSP
30.0000 mL | Freq: Once | ORAL | Status: AC
  Administered 2023-04-03: 30 mL via ORAL
  Filled 2023-04-03: qty 30

## 2023-04-03 MED ORDER — HYDROMORPHONE HCL 1 MG/ML IJ SOLN
0.5000 mg | Freq: Once | INTRAMUSCULAR | Status: AC
  Administered 2023-04-03: 0.5 mg via INTRAVENOUS
  Filled 2023-04-03: qty 1

## 2023-04-03 NOTE — Discharge Instructions (Addendum)
You were seen in the ER for evaluation of your abdominal pain. It was discovered that you have a hemorrhagic ovarian cyst on the right which is likely what is causing your pain. For this, you can try heating pad to your lower abdomen. I recommend Tylenol 1000mg  every 6 hours for pain. I would avoid ibuprofen given your gastritis. For your gastritis, I am going to give you some protonix that you will take daily for the next two weeks. Please make sure you follow up with a gynecologist for re-evaluation of your cyst. Please lower your consumption of alcohol. Please do not drive or operate heavy machinery while on this medication. If you have any concerns, new or worsening symptoms, please return to the nearest ER for re-evaluation.   Contact a doctor if: Your periods: Are late. Are not regular. Stop. Are painful. You have pain in the area between your hip bones, and the pain does not go away. You feel pressure on your bladder. You have trouble peeing. You feel full, or your belly hurts, swells, or bloats. You gain or lose weight without trying, or you are less hungry than normal. You feel pain and pressure in your back. You feel pain and pressure in the area between your hip bones. You think you may be pregnant. Get help right away if: You have pain in your belly that is very bad or gets worse. You have pain in the area between your hip bones, and the pain is very bad or gets worse. You cannot eat or drink without vomiting. You get a fever or chills all of a sudden. Your period is a lot heavier than usual.

## 2023-04-03 NOTE — ED Provider Notes (Signed)
  Physical Exam  BP (!) 126/96   Pulse 95   Temp 98 F (36.7 C) (Oral)   Resp 11   Ht 5\' 7"  (1.702 m)   Wt 70.3 kg   SpO2 96%   BMI 24.28 kg/m   Physical Exam  Procedures  Procedures  ED Course / MDM    Medical Decision Making Amount and/or Complexity of Data Reviewed Labs: ordered. Radiology: ordered.  Risk OTC drugs. Prescription drug management.   ***

## 2023-04-03 NOTE — ED Provider Notes (Signed)
[place] EMERGENCY DEPARTMENT AT [facility] Provider Note   CSN: [id number] Arrival date & time: 04/03/23  1508     History  Chief Complaint  Patient presents with   Abdominal Pain    Jodi Mcgee is a 37 y.o. female.  Reports that she drank a large amount of alcohol yesterday.  Patient reports she has severe abdominal pain and nausea today.  Patient reports she has a past medical history of pancreatitis she is concerned that she could have a again.  Patient has a past medical history of pelvic adhesions and diabetes.  Patient denies any fever or chills she denies any cough or congestion.  Patient has not had any diarrhea  The history is provided by the patient. No language interpreter was used.  Abdominal Pain Pain location:  Generalized Pain quality: gnawing, sharp and stabbing   Pain radiates to:  Does not radiate Onset quality:  Sudden Duration:  1 day Timing:  Constant Progression:  Worsening Chronicity:  New Relieved by:  Nothing Worsened by:  Nothing Ineffective treatments:  None tried Associated symptoms: nausea        Home Medications Prior to Admission medications   Medication Sig Start Date End Date Taking? Authorizing Provider  amLODipine (NORVASC) 5 MG tablet Take 1 tablet (5 mg total) by mouth daily. 10/28/21   Rhetta Mura, MD  blood glucose meter kit and supplies KIT Dispense based on patient and insurance preference. Use up to four times daily as directed. (FOR ICD-9 250.00, 250.01). 02/21/19   Roberto Scales D, MD  colestipol (COLESTID) 5 g granules Take 5 g by mouth 2 (two) times daily. 11/24/21 12/24/21  Marita Kansas, PA-C  dicyclomine (BENTYL) 20 MG tablet Take 1 tablet (20 mg total) by mouth 2 (two) times daily. 07/09/22   Marita Kansas, PA-C  feeding supplement, GLUCERNA SHAKE, (GLUCERNA SHAKE) LIQD Take 237 mLs by mouth 3 (three) times daily between meals. 11/05/21   [name], [name], DO  HUMULIN R 100 UNIT/ML injection Inject 20 Units into  the skin 3 (three) times daily before meals. Per sliding scale 03/24/20   [provider]  Levonorgestrel (LILETTA) 19.5 MCG/DAY IUD IUD 1 each by Intrauterine route continuous. Placed in 2019    [provider]  lidocaine (LIDODERM) 5 % Place 1 patch onto the skin daily. Remove & Discard patch within 12 hours or as directed by MD 12/08/21   Tegeler, Canary Brim, MD  nicotine (NICODERM CQ - DOSED IN MG/24 HR) 7 mg/24hr patch Place 1 patch (7 mg total) onto the skin daily. 10/28/21   Rhetta Mura, MD  ondansetron (ZOFRAN-ODT) 4 MG disintegrating tablet Take 1 tablet (4 mg total) by mouth every 8 (eight) hours as needed. 07/09/22   Marita Kansas, PA-C  oxyCODONE (OXY IR/ROXICODONE) 5 MG immediate release tablet Take 1 tablet (5 mg total) by mouth every 4 (four) hours as needed for severe pain. 10/28/21   Rhetta Mura, MD  pantoprazole (PROTONIX) 20 MG tablet Take 1 tablet (20 mg total) by mouth daily. 10/02/21   Al Decant, PA-C  potassium chloride SA (KLOR-CON M) 20 MEQ tablet Take 2 tablets (40 mEq total) by mouth daily for 7 days. 05/11/22 05/18/22  Charlynne Pander, MD  promethazine (PHENERGAN) 25 MG tablet Take 1 tablet (25 mg total) by mouth every 6 (six) hours as needed for nausea or vomiting. 05/11/22   Margarita Grizzle, MD  saccharomyces boulardii (FLORASTOR) 250 MG capsule Take 1 capsule (250 mg total)  by mouth 2 (two) times daily. 11/05/21   [name], [name], DO      Allergies    Hydrocodone, Iodinated contrast media, Cephalexin, Cetirizine & related, Toradol [ketorolac tromethamine], Flagyl [metronidazole], and Nsaids    Review of Systems   Review of Systems  Gastrointestinal:  Positive for abdominal pain and nausea.  All other systems reviewed and are negative.   Physical Exam Updated Vital Signs BP 111/72   Pulse 96   Temp 98.6 F (37 C) (Oral)   Resp 10   Ht 5\' 7"  (1.702 m)   Wt 70.3 kg   SpO2 100%   BMI 24.28 kg/m  Physical Exam Vitals and  nursing note reviewed.  Constitutional:      Appearance: She is well-developed.  HENT:     Head: Normocephalic.  Cardiovascular:     Rate and Rhythm: Tachycardia present.  Pulmonary:     Effort: Pulmonary effort is normal.  Abdominal:     General: Abdomen is flat. Bowel sounds are normal. There is no distension.     Palpations: Abdomen is soft.     Tenderness: There is generalized abdominal tenderness.  Musculoskeletal:        General: Normal range of motion.     Cervical back: Normal range of motion.  Skin:    General: Skin is warm.  Neurological:     Mental Status: She is alert and oriented to person, place, and time.     ED Results / Procedures / Treatments   Labs (all labs ordered are listed, but only abnormal results are displayed) Labs Reviewed  CBC WITH DIFFERENTIAL/PLATELET - Abnormal; Notable for the following components:      Result Value   Lymphs Abs 0.5 (*)    All other components within normal limits  COMPREHENSIVE METABOLIC PANEL - Abnormal; Notable for the following components:   Sodium 134 (*)    Potassium 3.4 (*)    Glucose, Bld 128 (*)    All other components within normal limits  URINALYSIS, ROUTINE W REFLEX MICROSCOPIC - Abnormal; Notable for the following components:   APPearance HAZY (*)    Ketones, ur 20 (*)    Protein, ur 100 (*)    Bacteria, UA MANY (*)    All other components within normal limits  LIPASE, BLOOD  PREGNANCY, URINE    EKG None  Radiology No results found.  Procedures Procedures    Medications Ordered in ED Medications  sodium chloride 0.9 % bolus 1,000 mL (0 mLs Intravenous Stopped 04/03/23 2008)  HYDROmorphone (DILAUDID) injection 0.5 mg (0.5 mg Intravenous Given 04/03/23 1648)  ondansetron (ZOFRAN) injection 4 mg (4 mg Intravenous Given 04/03/23 1647)  alum & mag hydroxide-simeth (MAALOX/MYLANTA) 200-200-20 MG/5ML suspension 30 mL (30 mLs Oral Given 04/03/23 1832)  HYDROmorphone (DILAUDID) injection 1 mg (1 mg  Intravenous Given 04/03/23 1834)    ED Course/ Medical Decision Making/ A&P                             Medical Decision Making Patient complains of abdominal pain nausea and vomiting.  Patient reports that she drank a large amount of alcohol yesterday  Amount and/or Complexity of Data Reviewed Labs: ordered. Decision-making details documented in ED Course.    Details: Ordered reviewed and interpreted potassium is 3.4 Radiology: ordered and independent interpretation performed. Decision-making details documented in ED Course.    Details: CT abdomen shows a 6 cm right ovarian lesion probable hemorrhagic  cyst.  Radiologist recommends ultrasound.  Risk OTC drugs. Prescription drug management. Risk Details: Patient given IV fluids Zofran and Dilaudid for discomfort. Ultrasound transvaginal ordered Patient's care turned over to Dr. Fredderick Phenix at 10 PM awaiting ultrasound and results.           Final Clinical Impression(s) / ED Diagnoses Final diagnoses:  Generalized abdominal pain  Acute alcoholic gastritis without hemorrhage  Ovarian cyst, right    Rx / DC Orders ED Discharge Orders     None         Osie Cheeks 04/03/23 2231    Rolan Bucco, MD 04/03/23 2303

## 2023-04-03 NOTE — ED Triage Notes (Signed)
Pt BIBA from home. Pt c/o LUQ abd pain after drinking heavily last night. Hx pancreatitis . Pt c/o N/V.  Pt unable to remain still throughout triage.   186 palp  99% 108 HR 126 CBG

## 2023-04-03 NOTE — ED Provider Triage Note (Signed)
Emergency Medicine Provider Triage Evaluation Note  Jodi Mcgee , a 37 y.o. female  was evaluated in triage.  Pt complains of drinking a large amount of alcohol last p.m.  Patient complains of severe abdominal pain today.  Patient has a past medical history of pancreatitis  Review of Systems  Positive: Nausea vomiting and abdominal pain Negative: Over  Physical Exam  BP (!) 152/112   Pulse (!) 120   Temp 97.7 F (36.5 C) (Oral)   Resp 20   Ht 5\' 7"  (1.702 m)   Wt 70.3 kg   SpO2 100%   BMI 24.28 kg/m  Gen:   Awake, no distress   Resp:  Normal effort  MSK:   Moves extremities without difficulty  Other:    Medical Decision Making  Medically screening exam initiated at 3:30 PM.  Appropriate orders placed.  Jodi Mcgee was informed that the remainder of the evaluation will be completed by another provider, this initial triage assessment does not replace that evaluation, and the importance of remaining in the ED until their evaluation is complete.     Elson Areas, [place] 04/03/23 1530

## 2023-04-04 MED ORDER — OXYCODONE-ACETAMINOPHEN 5-325 MG PO TABS
1.0000 | ORAL_TABLET | Freq: Once | ORAL | Status: AC
  Administered 2023-04-04: 1 via ORAL
  Filled 2023-04-04: qty 1
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
## 2023-07-28 ENCOUNTER — Other Ambulatory Visit: Payer: Self-pay

## 2023-07-28 ENCOUNTER — Encounter (HOSPITAL_COMMUNITY): Payer: Self-pay

## 2023-07-28 ENCOUNTER — Emergency Department (HOSPITAL_COMMUNITY)
Admission: EM | Admit: 2023-07-28 | Discharge: 2023-07-29 | Disposition: A | Discharge status: Home / Self Care | Payer: Medicaid Other | Attending: Emergency Medicine | Admitting: Emergency Medicine

## 2023-07-28 DIAGNOSIS — K2900 Acute gastritis without bleeding: Secondary | ICD-10-CM | POA: Insufficient documentation

## 2023-07-28 DIAGNOSIS — Z794 Long term (current) use of insulin: Secondary | ICD-10-CM | POA: Diagnosis not present

## 2023-07-28 DIAGNOSIS — R109 Unspecified abdominal pain: Secondary | ICD-10-CM | POA: Diagnosis present

## 2023-07-28 DIAGNOSIS — E119 Type 2 diabetes mellitus without complications: Secondary | ICD-10-CM | POA: Insufficient documentation

## 2023-07-28 LAB — CBC WITH DIFFERENTIAL/PLATELET
Abs Immature Granulocytes: 0.03 10*3/uL (ref 0.00–0.07)
Basophils Absolute: 0 10*3/uL (ref 0.0–0.1)
Basophils Relative: 0 %
Eosinophils Absolute: 0 10*3/uL (ref 0.0–0.5)
Eosinophils Relative: 0 %
HCT: 42.5 % (ref 36.0–46.0)
Hemoglobin: 13.8 g/dL (ref 12.0–15.0)
Immature Granulocytes: 0 %
Lymphocytes Relative: 15 %
Lymphs Abs: 1.1 10*3/uL (ref 0.7–4.0)
MCH: 28.7 pg (ref 26.0–34.0)
MCHC: 32.5 g/dL (ref 30.0–36.0)
MCV: 88.4 fL (ref 80.0–100.0)
Monocytes Absolute: 0.4 10*3/uL (ref 0.1–1.0)
Monocytes Relative: 6 %
Neutro Abs: 5.6 10*3/uL (ref 1.7–7.7)
Neutrophils Relative %: 79 %
Platelets: 336 10*3/uL (ref 150–400)
RBC: 4.81 MIL/uL (ref 3.87–5.11)
RDW: 13.3 % (ref 11.5–15.5)
WBC: 7.2 10*3/uL (ref 4.0–10.5)
nRBC: 0 % (ref 0.0–0.2)

## 2023-07-28 MED ORDER — DIPHENHYDRAMINE HCL 50 MG/ML IJ SOLN
25.0000 mg | Freq: Once | INTRAMUSCULAR | Status: AC
  Administered 2023-07-29: 25 mg via INTRAVENOUS
  Filled 2023-07-28: qty 1

## 2023-07-28 MED ORDER — SODIUM CHLORIDE 0.9 % IV BOLUS
1000.0000 mL | Freq: Once | INTRAVENOUS | Status: AC
  Administered 2023-07-28: 1000 mL via INTRAVENOUS

## 2023-07-28 MED ORDER — MORPHINE SULFATE (PF) 4 MG/ML IV SOLN
4.0000 mg | Freq: Once | INTRAVENOUS | Status: AC
  Administered 2023-07-28: 4 mg via INTRAVENOUS
  Filled 2023-07-28: qty 1

## 2023-07-28 MED ORDER — ONDANSETRON HCL 4 MG/2ML IJ SOLN
4.0000 mg | Freq: Once | INTRAMUSCULAR | Status: AC
  Administered 2023-07-28: 4 mg via INTRAVENOUS
  Filled 2023-07-28: qty 2

## 2023-07-28 NOTE — ED Triage Notes (Signed)
Per EMS, Pt complaining of abd pain, and N/V/D. Hx of pancreatitis, pt had a drink a couple of days ago, and states this feel like a flare up.

## 2023-07-29 ENCOUNTER — Emergency Department (HOSPITAL_COMMUNITY): Payer: Medicaid Other

## 2023-07-29 LAB — COMPREHENSIVE METABOLIC PANEL
ALT: 18 U/L (ref 0–44)
AST: 17 U/L (ref 15–41)
Albumin: 3.7 g/dL (ref 3.5–5.0)
Alkaline Phosphatase: 75 U/L (ref 38–126)
Anion gap: 9 (ref 5–15)
BUN: 12 mg/dL (ref 6–20)
CO2: 26 mmol/L (ref 22–32)
Calcium: 9 mg/dL (ref 8.9–10.3)
Chloride: 104 mmol/L (ref 98–111)
Creatinine, Ser: 0.66 mg/dL (ref 0.44–1.00)
GFR, Estimated: >60 mL/min (ref >60)
Glucose, Bld: 115 mg/dL — ABNORMAL HIGH (ref 70–99)
Potassium: 3 mmol/L — ABNORMAL LOW (ref 3.5–5.1)
Sodium: 139 mmol/L (ref 135–145)
Total Bilirubin: 1 mg/dL (ref 0.3–1.2)
Total Protein: 8.3 g/dL — ABNORMAL HIGH (ref 6.5–8.1)

## 2023-07-29 LAB — LIPASE, BLOOD: Lipase: 24 U/L (ref 11–51)

## 2023-07-29 MED ORDER — METOCLOPRAMIDE HCL 10 MG PO TABS: 10.0000 mg | ORAL_TABLET | Freq: Four times a day (QID) | ORAL | 0 refills | Status: DC

## 2023-07-29 MED ORDER — METOCLOPRAMIDE HCL 5 MG/ML IJ SOLN
10.0000 mg | Freq: Once | INTRAMUSCULAR | Status: AC
  Administered 2023-07-29: 10 mg via INTRAVENOUS
  Filled 2023-07-29: qty 2

## 2023-07-29 MED ORDER — IOHEXOL 300 MG/ML  SOLN
100.0000 mL | Freq: Once | INTRAMUSCULAR | Status: AC | PRN
  Administered 2023-07-29: 100 mL via INTRAVENOUS

## 2023-07-29 NOTE — ED Provider Notes (Signed)
[facility]ERGENC[facility]PARTMENT AT [facility] Provider Note   CSN: [id number] Arrival date & time: 07/28/23  1824     History  Chief Complaint  Patient presents with   Abdominal Pain         Jodi Mcgee is a 37 y.o. female.  Patient presents to the emergency department via EMS complaining of abdominal pain and nausea which began after drinking alcohol last night.  Patient said she drank 2-3 shots of tequila.  She states she has a history of pancreatitis and that this feels similar to her previous pancreatitis flares.  She states she has not had any alcohol in a few months due to her history of related abdominal pain.  Patient denies chest pain, shortness of breath, fevers, urinary symptoms, headache.  Past medical history significant for type II DM, chronic pain syndrome, history of cocaine abuse, GERD, pancreatitis, SBO   Abdominal Pain      Home Medications Prior to Admission medications   Medication Sig Start Date End Date Taking? Authorizing Provider  amLODipine (NORVASC) 5 MG tablet Take 1 tablet (5 mg total) by mouth daily. 10/28/21  Yes Rhetta Mura, MD  dicyclomine (BENTYL) 20 MG tablet Take 1 tablet (20 mg total) by mouth 2 (two) times daily. 07/09/22  Yes Ali, Amjad, PA-C  feeding supplement, GLUCERNA SHAKE, (GLUCERNA SHAKE) LIQD Take 237 mLs by mouth 3 (three) times daily between meals. 11/05/21  Yes [name], [name], DO  gabapentin (NEURONTIN) 300 MG capsule Take 900 mg by mouth 3 (three) times daily. 07/16/23  Yes [provider]  HUMULIN R 100 UNIT/ML injection Inject 20 Units into the skin 3 (three) times daily before meals. Per sliding scale 03/24/20  Yes [provider]  Levonorgestrel (LILETTA) 19.5 MCG/DAY IUD IUD 1 each by Intrauterine route continuous. Placed in 2019   Yes [provider]  lidocaine (LIDODERM) 5 % Place 1 patch onto the skin daily. Remove & Discard patch within 12 hours or as directed by MD 12/08/21  Yes  Tegeler, Canary Brim, MD  metoCLOPramide (REGLAN) 10 MG tablet Take 1 tablet (10 mg total) by mouth every 6 (six) hours. 07/29/23  Yes Barrie Dunker B, PA-C  ondansetron (ZOFRAN-ODT) 4 MG disintegrating tablet Take 1 tablet (4 mg total) by mouth every 8 (eight) hours as needed. 07/09/22  Yes Ali, Amjad, PA-C  oxyCODONE-acetaminophen (PERCOCET/ROXICET) 5-325 MG tablet Take 1 tablet by mouth every 8 (eight) hours as needed. 07/16/23 07/15/24 Yes [provider]  pantoprazole (PROTONIX) 20 MG tablet Take 1 tablet (20 mg total) by mouth daily. 04/03/23  Yes Achille Rich, PA-C  potassium chloride SA (KLOR-CON M) 20 MEQ tablet Take 2 tablets (40 mEq total) by mouth daily for 7 days. 05/11/22 07/29/23 Yes Charlynne Pander, MD  promethazine (PHENERGAN) 25 MG tablet Take 1 tablet (25 mg total) by mouth every 6 (six) hours as needed for nausea or vomiting. 05/11/22  Yes Margarita Grizzle, MD  triamcinolone ointment (KENALOG) 0.1 % Apply 1 Application topically 2 (two) times daily. 05/22/23 05/21/24 Yes [provider]  Vitamin D, Ergocalciferol, (DRISDOL) 1.25 MG (50000 UNIT) CAPS capsule Take 50,000 Units by mouth every 7 (seven) days. 07/26/23 10/12/23 Yes [provider]  blood glucose meter kit and supplies KIT Dispense based on patient and insurance preference. Use up to four times daily as directed. (FOR ICD-9 250.00, 250.01). 02/21/19   Roberto Scales D, MD  colestipol (COLESTID) 5 g granules Take 5 g by mouth 2 (two)  times daily. Patient not taking: Reported on 07/29/2023 11/24/21 12/24/21  Marita Kansas, PA-C  nicotine (NICODERM CQ - DOSED IN MG/24 HR) 7 mg/24hr patch Place 1 patch (7 mg total) onto the skin daily. 10/28/21   Rhetta Mura, MD  oxyCODONE (OXY IR/ROXICODONE) 5 MG immediate release tablet Take 1 tablet (5 mg total) by mouth every 4 (four) hours as needed for severe pain. 10/28/21   Rhetta Mura, MD  saccharomyces boulardii (FLORASTOR) 250 MG capsule Take 1 capsule (250  mg total) by mouth 2 (two) times daily. Patient not taking: Reported on 07/29/2023 11/05/21   [name], [name], DO      Allergies    Hydrocodone, Iodinated contrast media, Cephalexin, Cetirizine & related, Toradol [ketorolac tromethamine], Flagyl [metronidazole], and Nsaids    Review of Systems   Review of Systems  Gastrointestinal:  Positive for abdominal pain.    Physical Exam Updated Vital Signs BP (!) 139/98   Pulse 92   Temp 98.4 F (36.9 C)   Resp 18   Ht 5\' 6"  (1.676 m)   Wt 72.6 kg   SpO2 100%   BMI 25.82 kg/m  Physical Exam Vitals and nursing note reviewed.  Constitutional:      General: She is not in acute distress.    Appearance: She is well-developed.  HENT:     Head: Normocephalic and atraumatic.  Eyes:     Conjunctiva/sclera: Conjunctivae normal.  Cardiovascular:     Rate and Rhythm: Normal rate and regular rhythm.     Heart sounds: No murmur heard. Pulmonary:     Effort: Pulmonary effort is normal. No respiratory distress.     Breath sounds: Normal breath sounds.  Abdominal:     Palpations: Abdomen is soft.     Tenderness: There is generalized abdominal tenderness. There is no right CVA tenderness or left CVA tenderness.  Musculoskeletal:        General: No swelling.     Cervical back: Neck supple.  Skin:    General: Skin is warm and dry.     Capillary Refill: Capillary refill takes less than 2 seconds.  Neurological:     Mental Status: She is alert.  Psychiatric:        Mood and Affect: Mood normal.     ED Results / Procedures / Treatments   Labs (all labs ordered are listed, but only abnormal results are displayed) Labs Reviewed  COMPREHENSIVE METABOLIC PANEL - Abnormal; Notable for the following components:      Result Value   Potassium 3.0 (*)    Glucose, Bld 115 (*)    Total Protein 8.3 (*)    All other components within normal limits  CBC WITH DIFFERENTIAL/PLATELET  LIPASE, BLOOD  URINALYSIS, ROUTINE W REFLEX MICROSCOPIC     EKG None  Radiology CT ABDOMEN PELVIS W CONTRAST  Result Date: 07/29/2023 CLINICAL DATA:  abd pain, and N/V/D. Hx of pancreatitis, pt had a drink a couple of days ago, and states this feel like a flare up. EXAM: CT ABDOMEN AND PELVIS WITH CONTRAST TECHNIQUE: Multidetector CT imaging of the abdomen and pelvis was performed using the standard protocol following bolus administration of intravenous contrast. RADIATION DOSE REDUCTION: This exam was performed according to the departmental dose-optimization program which includes automated exposure control, adjustment of the mA and/or kV according to patient size and/or use of iterative reconstruction technique. CONTRAST:  OMNIPAQUE IOHEXOL 300 MG/ML  SOLN COMPARISON:  CT abdomen pelvis 11/02/2022, CT abdomen pelvis 09/27/2021, CT abdomen pelvis  09/28/2020, ultrasound pelvis 04/03/2023 FINDINGS: Lower chest: No acute abnormality. Hepatobiliary: No focal liver abnormality. No gallstones, gallbladder wall thickening, or pericholecystic fluid. No biliary dilatation. Pancreas: No focal lesion. Normal pancreatic contour. No surrounding inflammatory changes. No main pancreatic ductal dilatation. Spleen: Normal in size without focal abnormality. Adrenals/Urinary Tract: There is a stable chronic 2 x 1.4 cm right adrenal gland nodule with a density of 108 Hounsfield units. Bilateral kidneys enhance symmetrically. No hydronephrosis. No hydroureter. The urinary bladder is unremarkable. Stomach/Bowel: Stomach is within normal limits. No evidence of bowel wall thickening or dilatation. The appendix is not definitely identified with no inflammatory changes in the right lower quadrant to suggest acute appendicitis. Vascular/Lymphatic: No abdominal aorta or iliac aneurysm. Mild atherosclerotic plaque. No abdominal, pelvic, or inguinal lymphadenopathy. Reproductive: Similar position of T-shaped intrauterine device noted within the endometrium with tip possibly imbedded  within the myometrium anteriorly (9:103). Otherwise uterus and bilateral adnexa are unremarkable. Other: No intraperitoneal free fluid. No intraperitoneal free gas. No organized fluid collection. Musculoskeletal: No abdominal wall hernia or abnormality. No suspicious lytic or blastic osseous lesions. No acute displaced fracture. IMPRESSION: 1. No acute intra-abdominal or intrapelvic abnormality. No CT finding of acute pancreatitis. 2. Similar position of a T-shaped intrauterine device within the endometrium with tip possibly imbedded within the myometrium anteriorly. 3. Stable chronic right adrenal benign adenoma. Since stable for ? 1 year, no further follow-up imaging. JACR 2017 Aug; 14(8):1038-44, JCAT 2016 Mar-Apr; 40(2):194-200, Urol J 2006 Spring; 3(2):71-4. Electronically Signed   By: Tish Frederickson M.D.   On: 07/29/2023 02:33    Procedures Procedures    Medications Ordered in ED Medications  morphine (PF) 4 MG/ML injection 4 mg (4 mg Intravenous Given 07/28/23 2309)  ondansetron (ZOFRAN) injection 4 mg (4 mg Intravenous Given 07/28/23 2309)  sodium chloride 0.9 % bolus 1,000 mL (1,000 mLs Intravenous New Bag/Given 07/28/23 2306)  diphenhydrAMINE (BENADRYL) injection 25 mg (25 mg Intravenous Given 07/29/23 0036)  iohexol (OMNIPAQUE) 300 MG/ML solution 100 mL (100 mLs Intravenous Contrast Given 07/29/23 0208)  metoCLOPramide (REGLAN) injection 10 mg (10 mg Intravenous Given 07/29/23 0407)    ED Course/ Medical Decision Making/ A&P                                 Medical Decision Making Amount and/or Complexity of Data Reviewed Labs: ordered. Radiology: ordered.  Risk Prescription drug management.   This patient presents to the ED for concern of abdominal pain, this involves an extensive number of treatment options, and is a complaint that carries with it a high risk of complications and morbidity.  The differential diagnosis includes gastritis, pancreatitis, appendicitis, colitis,  gastroenteritis, others   Co morbidities that complicate the patient evaluation  Chronic pain syndrome, intractable abdominal pain   Additional history obtained:  Additional history obtained from EMS External records from outside source obtained and reviewed including family medicine notes from September 23   Lab Tests:  I Ordered, and personally interpreted labs.  The pertinent results include: Potassium 3.0, lipase 24, unremarkable CBC   Imaging Studies ordered:  I ordered imaging studies including CT abdomen pelvis with contrast  I independently visualized and interpreted imaging which showed  1. No acute intra-abdominal or intrapelvic abnormality. No CT  finding of acute pancreatitis.  2. Similar position of a T-shaped intrauterine device within the  endometrium with tip possibly imbedded within the myometrium  anteriorly.  3. Stable chronic right  adrenal benign adenoma. Since stable for ? 1  year, no further follow-up imaging   I agree with the radiologist interpretation   Problem List / ED Course / Critical interventions / Medication management   I ordered medication including morphine for pain, Benadryl to premedicate before CTA, Zofran for nausea, Reglan for abdominal pain Reevaluation of the patient after these medicines showed that the patient improved I have reviewed the patients home medicines and have made adjustments as needed   Social Determinants of Health:  Patient has Medicaid for primary health insurance type.   Test / Admission - Considered:  No acute findings on lab work or CT to explain patient's abdominal pain.  With symptoms coinciding with patient's alcohol usage feeling this is likely alcohol related gastritis.  Patient's pain did improve moderately with Reglan.  Plan to discharge home with recommendations for reflux medications and alcohol cessation.  Resources provided.  Return precautions provided.  No indication for  admission.         Final Clinical Impression(s) / ED Diagnoses Final diagnoses:  Acute gastritis, presence of bleeding unspecified, unspecified gastritis type    Rx / DC Orders ED Discharge Orders          Ordered    metoCLOPramide (REGLAN) 10 MG tablet  Every 6 hours        07/29/23 0533              Darrick Grinder, PA-C 07/29/23 0534    Nira Conn, MD 07/29/23 1931

## 2023-07-29 NOTE — Discharge Instructions (Addendum)
Your workup tonight was reassuring.  Your symptoms seem to be related to your alcohol consumption.  Is important that you stop consuming alcohol as you report that this causes abdominal pain. I prescribed a medication to help with the abdominal pain. If you develop any life-threatening symptoms please return to the emergency department.
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
## 2024-01-25 ENCOUNTER — Emergency Department (HOSPITAL_BASED_OUTPATIENT_CLINIC_OR_DEPARTMENT_OTHER)
Admission: EM | Admit: 2024-01-25 | Discharge: 2024-01-26 | Disposition: A | Discharge status: Home / Self Care | Attending: Emergency Medicine | Admitting: Emergency Medicine

## 2024-01-25 DIAGNOSIS — Z794 Long term (current) use of insulin: Secondary | ICD-10-CM | POA: Diagnosis not present

## 2024-01-25 DIAGNOSIS — Z79899 Other long term (current) drug therapy: Secondary | ICD-10-CM | POA: Insufficient documentation

## 2024-01-25 DIAGNOSIS — R112 Nausea with vomiting, unspecified: Secondary | ICD-10-CM | POA: Diagnosis not present

## 2024-01-25 DIAGNOSIS — R1032 Left lower quadrant pain: Secondary | ICD-10-CM | POA: Diagnosis present

## 2024-01-25 NOTE — ED Triage Notes (Signed)
 LLQ abd pain x3 days. Progressively worse. Denies urinary symptoms. Feels different from 'normal' stomach pains she has. Last BM today. +N/+V.

## 2024-01-26 ENCOUNTER — Emergency Department (HOSPITAL_BASED_OUTPATIENT_CLINIC_OR_DEPARTMENT_OTHER)

## 2024-01-26 LAB — COMPREHENSIVE METABOLIC PANEL WITH GFR
ALT: 17 U/L (ref 0–44)
AST: 23 U/L (ref 15–41)
Albumin: 4.5 g/dL (ref 3.5–5.0)
Alkaline Phosphatase: 88 U/L (ref 38–126)
Anion gap: 15 (ref 5–15)
BUN: 18 mg/dL (ref 6–20)
CO2: 16 mmol/L — ABNORMAL LOW (ref 22–32)
Calcium: 9.4 mg/dL (ref 8.9–10.3)
Chloride: 105 mmol/L (ref 98–111)
Creatinine, Ser: 0.67 mg/dL (ref 0.44–1.00)
GFR, Estimated: >60 mL/min (ref >60)
Glucose, Bld: 115 mg/dL — ABNORMAL HIGH (ref 70–99)
Potassium: 4.3 mmol/L (ref 3.5–5.1)
Sodium: 136 mmol/L (ref 135–145)
Total Bilirubin: 0.4 mg/dL (ref 0.0–1.2)
Total Protein: 8.4 g/dL — ABNORMAL HIGH (ref 6.5–8.1)

## 2024-01-26 LAB — URINALYSIS, ROUTINE W REFLEX MICROSCOPIC
Bilirubin Urine: NEGATIVE
Glucose, UA: NEGATIVE mg/dL
Hgb urine dipstick: NEGATIVE
Ketones, ur: 15 mg/dL — AB
Leukocytes,Ua: NEGATIVE
Nitrite: POSITIVE — AB
Protein, ur: 30 mg/dL — AB
Specific Gravity, Urine: 1.038 — ABNORMAL HIGH (ref 1.005–1.030)
pH: 5.5 (ref 5.0–8.0)

## 2024-01-26 LAB — CBC
HCT: 42.6 % (ref 36.0–46.0)
Hemoglobin: 14 g/dL (ref 12.0–15.0)
MCH: 29.5 pg (ref 26.0–34.0)
MCHC: 32.9 g/dL (ref 30.0–36.0)
MCV: 89.7 fL (ref 80.0–100.0)
Platelets: 287 10*3/uL (ref 150–400)
RBC: 4.75 MIL/uL (ref 3.87–5.11)
RDW: 13.5 % (ref 11.5–15.5)
WBC: 6.7 10*3/uL (ref 4.0–10.5)
nRBC: 0 % (ref 0.0–0.2)

## 2024-01-26 LAB — HCG, SERUM, QUALITATIVE: Preg, Serum: NEGATIVE

## 2024-01-26 LAB — LIPASE, BLOOD: Lipase: 16 U/L (ref 11–51)

## 2024-01-26 MED ORDER — HYDROMORPHONE HCL 1 MG/ML IJ SOLN
1.0000 mg | Freq: Once | INTRAMUSCULAR | Status: AC
  Administered 2024-01-26: 1 mg via INTRAVENOUS
  Filled 2024-01-26: qty 1

## 2024-01-26 MED ORDER — HYOSCYAMINE SULFATE 0.125 MG SL SUBL
0.2500 mg | SUBLINGUAL_TABLET | Freq: Once | SUBLINGUAL | Status: AC
  Administered 2024-01-26: 0.25 mg via SUBLINGUAL
  Filled 2024-01-26: qty 2

## 2024-01-26 MED ORDER — ONDANSETRON HCL 4 MG/2ML IJ SOLN
4.0000 mg | Freq: Once | INTRAMUSCULAR | Status: AC
  Administered 2024-01-26: 4 mg via INTRAVENOUS
  Filled 2024-01-26: qty 2

## 2024-01-26 MED ORDER — OXYCODONE-ACETAMINOPHEN 5-325 MG PO TABS
1.0000 | ORAL_TABLET | Freq: Once | ORAL | Status: AC
  Administered 2024-01-26: 1 via ORAL
  Filled 2024-01-26: qty 1

## 2024-01-26 MED ORDER — SODIUM CHLORIDE 0.9 % IV BOLUS
1000.0000 mL | Freq: Once | INTRAVENOUS | Status: AC
  Administered 2024-01-26: 1000 mL via INTRAVENOUS

## 2024-01-26 MED ORDER — HYOSCYAMINE SULFATE 0.125 MG SL SUBL: 0.1250 mg | SUBLINGUAL_TABLET | SUBLINGUAL | 0 refills | Status: AC | PRN

## 2024-01-26 NOTE — ED Provider Notes (Signed)
 [place] EMERGENCY DEPARTMENT AT [facility] Provider Note   CSN: [id number] Arrival date & time: 01/25/24  2318     History  No chief complaint on file.   Jodi Mcgee is a 38 y.o. female.  Patient presents to the emergency department for evaluation of left lower quadrant abdominal pain.  Patient reports severe pain currently.  She does have a history of recurrent abdominal pain.  She reports multiple surgeries and prior small bowel obstruction.  She has had nausea and vomiting.  She is, however, having normal bowel movements.       Home Medications Prior to Admission medications   Medication Sig Start Date End Date Taking? Authorizing Provider  amLODipine (NORVASC) 5 MG tablet Take 1 tablet (5 mg total) by mouth daily. 10/28/21   Rhetta Mura, MD  blood glucose meter kit and supplies KIT Dispense based on patient and insurance preference. Use up to four times daily as directed. (FOR ICD-9 250.00, 250.01). 02/21/19   Roberto Scales D, MD  colestipol (COLESTID) 5 g granules Take 5 g by mouth 2 (two) times daily. Patient not taking: Reported on 07/29/2023 11/24/21 12/24/21  Marita Kansas, PA-C  dicyclomine (BENTYL) 20 MG tablet Take 1 tablet (20 mg total) by mouth 2 (two) times daily. 07/09/22   Marita Kansas, PA-C  feeding supplement, GLUCERNA SHAKE, (GLUCERNA SHAKE) LIQD Take 237 mLs by mouth 3 (three) times daily between meals. 11/05/21   [name], [name], DO  gabapentin (NEURONTIN) 300 MG capsule Take 900 mg by mouth 3 (three) times daily. 07/16/23   [provider]  HUMULIN R 100 UNIT/ML injection Inject 20 Units into the skin 3 (three) times daily before meals. Per sliding scale 03/24/20   [provider]  Levonorgestrel (LILETTA) 19.5 MCG/DAY IUD IUD 1 each by Intrauterine route continuous. Placed in 2019    [provider]  lidocaine (LIDODERM) 5 % Place 1 patch onto the skin daily. Remove & Discard patch within 12 hours or as directed by MD 12/08/21    Tegeler, Canary Brim, MD  metoCLOPramide (REGLAN) 10 MG tablet Take 1 tablet (10 mg total) by mouth every 6 (six) hours. 07/29/23   Darrick Grinder, PA-C  nicotine (NICODERM CQ - DOSED IN MG/24 HR) 7 mg/24hr patch Place 1 patch (7 mg total) onto the skin daily. 10/28/21   Rhetta Mura, MD  ondansetron (ZOFRAN-ODT) 4 MG disintegrating tablet Take 1 tablet (4 mg total) by mouth every 8 (eight) hours as needed. 07/09/22   Marita Kansas, PA-C  oxyCODONE (OXY IR/ROXICODONE) 5 MG immediate release tablet Take 1 tablet (5 mg total) by mouth every 4 (four) hours as needed for severe pain. 10/28/21   Rhetta Mura, MD  oxyCODONE-acetaminophen (PERCOCET/ROXICET) 5-325 MG tablet Take 1 tablet by mouth every 8 (eight) hours as needed. 07/16/23 07/15/24  [provider]  pantoprazole (PROTONIX) 20 MG tablet Take 1 tablet (20 mg total) by mouth daily. 04/03/23   Achille Rich, PA-C  potassium chloride SA (KLOR-CON M) 20 MEQ tablet Take 2 tablets (40 mEq total) by mouth daily for 7 days. 05/11/22 07/29/23  Charlynne Pander, MD  promethazine (PHENERGAN) 25 MG tablet Take 1 tablet (25 mg total) by mouth every 6 (six) hours as needed for nausea or vomiting. 05/11/22   Margarita Grizzle, MD  saccharomyces boulardii (FLORASTOR) 250 MG capsule Take 1 capsule (250 mg total) by mouth 2 (two) times daily. Patient not taking: Reported on 07/29/2023 11/05/21   [name], [name], DO  triamcinolone  ointment (KENALOG) 0.1 % Apply 1 Application topically 2 (two) times daily. 05/22/23 05/21/24  [provider]      Allergies    Hydrocodone, Iodinated contrast media, Cephalexin, Cetirizine & related, Toradol [ketorolac tromethamine], Flagyl [metronidazole], and Nsaids    Review of Systems   Review of Systems  Physical Exam Updated Vital Signs BP (!) 147/106   Pulse (!) 106   Temp 97.7 F (36.5 C)   Resp (!) 22   SpO2 100%  Physical Exam Vitals and nursing note reviewed.  Constitutional:      General: She is  not in acute distress.    Appearance: She is well-developed.  HENT:     Head: Normocephalic and atraumatic.     Mouth/Throat:     Mouth: Mucous membranes are moist.  Eyes:     General: Vision grossly intact. Gaze aligned appropriately.     Extraocular Movements: Extraocular movements intact.     Conjunctiva/sclera: Conjunctivae normal.  Cardiovascular:     Rate and Rhythm: Normal rate and regular rhythm.     Pulses: Normal pulses.     Heart sounds: Normal heart sounds, S1 normal and S2 normal. No murmur heard.    No friction rub. No gallop.  Pulmonary:     Effort: Pulmonary effort is normal. No respiratory distress.     Breath sounds: Normal breath sounds.  Abdominal:     General: Bowel sounds are normal.     Palpations: Abdomen is soft.     Tenderness: There is abdominal tenderness in the left lower quadrant. There is no guarding or rebound.     Hernia: No hernia is present.  Musculoskeletal:        General: No swelling.     Cervical back: Full passive range of motion without pain, normal range of motion and neck supple. No spinous process tenderness or muscular tenderness. Normal range of motion.     Right lower leg: No edema.     Left lower leg: No edema.  Skin:    General: Skin is warm and dry.     Capillary Refill: Capillary refill takes less than 2 seconds.     Findings: No ecchymosis, erythema, rash or wound.  Neurological:     General: No focal deficit present.     Mental Status: She is alert and oriented to person, place, and time.     GCS: GCS eye subscore is 4. GCS verbal subscore is 5. GCS motor subscore is 6.     Cranial Nerves: Cranial nerves 2-12 are intact.     Sensory: Sensation is intact.     Motor: Motor function is intact.     Coordination: Coordination is intact.  Psychiatric:        Attention and Perception: Attention normal.        Mood and Affect: Mood normal.        Speech: Speech normal.        Behavior: Behavior normal.     ED Results /  Procedures / Treatments   Labs (all labs ordered are listed, but only abnormal results are displayed) Labs Reviewed  COMPREHENSIVE METABOLIC PANEL WITH GFR - Abnormal; Notable for the following components:      Result Value   CO2 16 (*)    Glucose, Bld 115 (*)    Total Protein 8.4 (*)    All other components within normal limits  URINALYSIS, ROUTINE W REFLEX MICROSCOPIC - Abnormal; Notable for the following components:   Specific Gravity,  Urine 1.038 (*)    Ketones, ur 15 (*)    Protein, ur 30 (*)    Nitrite POSITIVE (*)    Bacteria, UA MANY (*)    All other components within normal limits  LIPASE, BLOOD  CBC  HCG, SERUM, QUALITATIVE    EKG None  Radiology CT ABDOMEN PELVIS WO CONTRAST Result Date: 01/26/2024 CLINICAL DATA:  Abdominal pain, acute, nonlocalized. Bowel obstruction or diverticulitis suspected. EXAM: CT ABDOMEN AND PELVIS WITHOUT CONTRAST TECHNIQUE: Multidetector CT imaging of the abdomen and pelvis was performed following the standard protocol without IV contrast. RADIATION DOSE REDUCTION: This exam was performed according to the departmental dose-optimization program which includes automated exposure control, adjustment of the mA and/or kV according to patient size and/or use of iterative reconstruction technique. COMPARISON:  07/29/2023. FINDINGS: Lower chest: No acute abnormality. Hepatobiliary: No focal liver abnormality is seen. No gallstones, gallbladder wall thickening, or biliary dilatation. Pancreas: Unremarkable. No pancreatic ductal dilatation or surrounding inflammatory changes. Spleen: Normal in size without focal abnormality. Adrenals/Urinary Tract: There is a right adrenal nodule measuring 2.3 cm with attenuation of 5 Hounsfield units, compatible with adenoma. The left adrenal gland is within normal limits. No renal calculus or obstructive uropathy is seen bilaterally. The bladder is unremarkable. Stomach/Bowel: Stomach is within normal limits. Appendix is not  seen. No evidence of bowel wall thickening, distention, or inflammatory changes. No free air or pneumatosis. A few scattered diverticula are present along the colon without evidence of diverticulitis. Vascular/Lymphatic: No significant vascular findings are present. No enlarged abdominal or pelvic lymph nodes. Reproductive: An IUD is present in the uterus. No adnexal mass is seen. Other: Small amount of free fluid is noted in the cul-de-sac on the right. Musculoskeletal: No acute osseous abnormality. IMPRESSION: 1. No acute intra-abdominal process. 2. Stable right adrenal adenoma. 3. Trace amount of free fluid in the cul-de-sac on the right. Electronically Signed   By: Thornell Sartorius M.D.   On: 01/26/2024 02:46    Procedures Procedures    Medications Ordered in ED Medications  sodium chloride 0.9 % bolus 1,000 mL (0 mLs Intravenous Stopped 01/26/24 0115)  HYDROmorphone (DILAUDID) injection 1 mg (1 mg Intravenous Given 01/26/24 0013)  ondansetron (ZOFRAN) injection 4 mg (4 mg Intravenous Given 01/26/24 0013)    ED Course/ Medical Decision Making/ A&P                                 Medical Decision Making Amount and/or Complexity of Data Reviewed Labs: ordered. Radiology: ordered.  Risk Prescription drug management.   Differential Diagnosis considered includes, but not limited to: Appendicitis; colitis; diverticulitis; bowel obstruction; cystitis; nephrolithiasis; pyelonephritis; ovarian cyst, ovarian torsion, PID, ectopic pregnancy.   Presents to the emergency department for evaluation of lower abdominal pain.  Patient complaining of pain in the left lower quadrant area.  Reviewing her records reveals prior complaints of this.  She reports that this is not her typical pain.  Patient appeared uncomfortable on arrival.  No GU complaints.  Lab work is entirely normal.  CT scan performed does not show any acute abnormality.  No evidence of obstruction.  No evidence of diverticulitis/colitis.  No  adnexal masses or abnormalities.        Final Clinical Impression(s) / ED Diagnoses Final diagnoses:  Left lower quadrant abdominal pain    Rx / DC Orders ED Discharge Orders     None  Gilda Crease, MD 01/26/24 0700

## 2024-03-31 ENCOUNTER — Emergency Department (HOSPITAL_COMMUNITY)
Admission: EM | Admit: 2024-03-31 | Discharge: 2024-03-31 | Disposition: A | Discharge status: Home / Self Care | Attending: Emergency Medicine | Admitting: Emergency Medicine

## 2024-03-31 ENCOUNTER — Encounter (HOSPITAL_COMMUNITY): Payer: Self-pay

## 2024-03-31 ENCOUNTER — Emergency Department (HOSPITAL_COMMUNITY)

## 2024-03-31 ENCOUNTER — Other Ambulatory Visit: Payer: Self-pay

## 2024-03-31 DIAGNOSIS — K5900 Constipation, unspecified: Secondary | ICD-10-CM | POA: Diagnosis not present

## 2024-03-31 DIAGNOSIS — E119 Type 2 diabetes mellitus without complications: Secondary | ICD-10-CM | POA: Insufficient documentation

## 2024-03-31 DIAGNOSIS — Z794 Long term (current) use of insulin: Secondary | ICD-10-CM | POA: Insufficient documentation

## 2024-03-31 DIAGNOSIS — R112 Nausea with vomiting, unspecified: Secondary | ICD-10-CM

## 2024-03-31 DIAGNOSIS — R1084 Generalized abdominal pain: Secondary | ICD-10-CM

## 2024-03-31 LAB — URINALYSIS, ROUTINE W REFLEX MICROSCOPIC
Bilirubin Urine: NEGATIVE
Glucose, UA: NEGATIVE mg/dL
Hgb urine dipstick: NEGATIVE
Ketones, ur: 20 mg/dL — AB
Leukocytes,Ua: NEGATIVE
Nitrite: NEGATIVE
Protein, ur: 30 mg/dL — AB
Specific Gravity, Urine: 1.027 (ref 1.005–1.030)
pH: 5 (ref 5.0–8.0)

## 2024-03-31 LAB — CBC
HCT: 45.3 % (ref 36.0–46.0)
Hemoglobin: 14.6 g/dL (ref 12.0–15.0)
MCH: 29.2 pg (ref 26.0–34.0)
MCHC: 32.2 g/dL (ref 30.0–36.0)
MCV: 90.6 fL (ref 80.0–100.0)
Platelets: 353 10*3/uL (ref 150–400)
RBC: 5 MIL/uL (ref 3.87–5.11)
RDW: 14.3 % (ref 11.5–15.5)
WBC: 6.9 10*3/uL (ref 4.0–10.5)
nRBC: 0 % (ref 0.0–0.2)

## 2024-03-31 LAB — COMPREHENSIVE METABOLIC PANEL WITH GFR
ALT: 14 U/L (ref 0–44)
AST: 21 U/L (ref 15–41)
Albumin: 3.8 g/dL (ref 3.5–5.0)
Alkaline Phosphatase: 66 U/L (ref 38–126)
Anion gap: 12 (ref 5–15)
BUN: 13 mg/dL (ref 6–20)
CO2: 23 mmol/L (ref 22–32)
Calcium: 8.9 mg/dL (ref 8.9–10.3)
Chloride: 100 mmol/L (ref 98–111)
Creatinine, Ser: 0.78 mg/dL (ref 0.44–1.00)
GFR, Estimated: >60 mL/min (ref >60)
Glucose, Bld: 105 mg/dL — ABNORMAL HIGH (ref 70–99)
Potassium: 3.5 mmol/L (ref 3.5–5.1)
Sodium: 135 mmol/L (ref 135–145)
Total Bilirubin: 1.2 mg/dL (ref 0.0–1.2)
Total Protein: 7.8 g/dL (ref 6.5–8.1)

## 2024-03-31 LAB — HCG, SERUM, QUALITATIVE: Preg, Serum: NEGATIVE

## 2024-03-31 LAB — LIPASE, BLOOD: Lipase: 23 U/L (ref 11–51)

## 2024-03-31 MED ORDER — HYDROMORPHONE HCL 1 MG/ML IJ SOLN
1.0000 mg | Freq: Once | INTRAMUSCULAR | Status: AC
  Administered 2024-03-31: 1 mg via INTRAMUSCULAR

## 2024-03-31 MED ORDER — HYDROMORPHONE HCL 1 MG/ML IJ SOLN
1.0000 mg | Freq: Once | INTRAMUSCULAR | Status: AC
  Administered 2024-03-31: 1 mg via INTRAVENOUS
  Filled 2024-03-31: qty 1

## 2024-03-31 MED ORDER — SODIUM CHLORIDE 0.9 % IV BOLUS
1000.0000 mL | Freq: Once | INTRAVENOUS | Status: AC
  Administered 2024-03-31: 1000 mL via INTRAVENOUS

## 2024-03-31 MED ORDER — ONDANSETRON HCL 4 MG/2ML IJ SOLN
4.0000 mg | Freq: Once | INTRAMUSCULAR | Status: AC
  Administered 2024-03-31: 4 mg via INTRAVENOUS
  Filled 2024-03-31: qty 2

## 2024-03-31 MED ORDER — ONDANSETRON 4 MG PO TBDP: 4.0000 mg | ORAL_TABLET | Freq: Three times a day (TID) | ORAL | 0 refills | Status: DC | PRN

## 2024-03-31 MED ORDER — OXYCODONE-ACETAMINOPHEN 5-325 MG PO TABS: 1.0000 | ORAL_TABLET | ORAL | 0 refills | Status: DC | PRN

## 2024-03-31 MED ORDER — HYDROMORPHONE HCL 1 MG/ML IJ SOLN
1.0000 mg | Freq: Once | INTRAMUSCULAR | Status: DC
  Filled 2024-03-31: qty 1

## 2024-03-31 NOTE — ED Triage Notes (Signed)
 Pt to ED via [facility] from home. Pt has had abdominal pain, vomiting and constipation x 3 days. Pt states she also has pressure when she urinates and it is orange in color.

## 2024-03-31 NOTE — ED Provider Notes (Addendum)
 [place] EMERGENCY DEPARTMENT AT [facility] Provider Note   CSN: [id number] Arrival date & time: 03/31/24  4098     History  Chief Complaint  Patient presents with   Abdominal Pain    Jodi Mcgee is a 38 y.o. female.  The history is provided by the patient and medical records. No language interpreter was used.  Abdominal Pain Pain location:  Generalized Pain quality: aching, bloating, dull and pressure   Pain radiates to:  Does not radiate Pain severity:  Severe Onset quality:  Gradual Duration:  4 days Timing:  Constant Progression:  Worsening Chronicity:  Recurrent Relieved by:  Nothing Worsened by:  Nothing Ineffective treatments:  None tried Associated symptoms: constipation, fatigue, nausea and vomiting   Associated symptoms: no chest pain, no chills, no cough, no diarrhea, no dysuria, no fever, no shortness of breath, no vaginal bleeding and no vaginal discharge   Risk factors: multiple surgeries        Home Medications Prior to Admission medications   Medication Sig Start Date End Date Taking? Authorizing Provider  amLODipine  (NORVASC ) 5 MG tablet Take 1 tablet (5 mg total) by mouth daily. 10/28/21   Samtani, Jai-Gurmukh, MD  blood glucose meter kit and supplies KIT Dispense based on patient and insurance preference. Use up to four times daily as directed. (FOR ICD-9 250.00, 250.01). 02/21/19   [name], MD  colestipol  (COLESTID ) 5 g granules Take 5 g by mouth 2 (two) times daily. Patient not taking: Reported on 07/29/2023 11/24/21 12/24/21  [name], PA-C  dicyclomine  (BENTYL ) 20 MG tablet Take 1 tablet (20 mg total) by mouth 2 (two) times daily. 07/09/22   [name], PA-C  feeding supplement, GLUCERNA SHAKE, (GLUCERNA SHAKE) LIQD Take 237 mLs by mouth 3 (three) times daily between meals. 11/05/21   [name], [name], DO  gabapentin (NEURONTIN) 300 MG capsule Take 900 mg by mouth 3 (three) times daily. 07/16/23   [provider]  HUMULIN  R  100 UNIT/ML injection Inject 20 Units into the skin 3 (three) times daily before meals. Per sliding scale 03/24/20   [provider]  hyoscyamine  (LEVSIN SL) 0.125 MG SL tablet Place 1 tablet (0.125 mg total) under the tongue every 4 (four) hours as needed for cramping (abdominal pain). 01/26/24   Ballard Bongo, MD  Levonorgestrel  (LILETTA ) 19.5 MCG/DAY IUD IUD 1 each by Intrauterine route continuous. Placed in 2019    [provider]  lidocaine  (LIDODERM ) 5 % Place 1 patch onto the skin daily. Remove & Discard patch within 12 hours or as directed by MD 12/08/21   Shateria Paternostro, Marine Sia, MD  metoCLOPramide  (REGLAN ) 10 MG tablet Take 1 tablet (10 mg total) by mouth every 6 (six) hours. 07/29/23   [name], PA-C  nicotine  (NICODERM CQ  - DOSED IN MG/24 HR) 7 mg/24hr patch Place 1 patch (7 mg total) onto the skin daily. 10/28/21   Samtani, Jai-Gurmukh, MD  ondansetron  (ZOFRAN -ODT) 4 MG disintegrating tablet Take 1 tablet (4 mg total) by mouth every 8 (eight) hours as needed. 07/09/22   [name], PA-C  oxyCODONE  (OXY IR/ROXICODONE ) 5 MG immediate release tablet Take 1 tablet (5 mg total) by mouth every 4 (four) hours as needed for severe pain. 10/28/21   Samtani, Jai-Gurmukh, MD  oxyCODONE -acetaminophen  (PERCOCET/ROXICET) 5-325 MG tablet Take 1 tablet by mouth every 8 (eight) hours as needed. 07/16/23 07/15/24  [provider]  pantoprazole  (PROTONIX ) 20 MG tablet Take 1 tablet (20 mg total)  by mouth daily. 04/03/23   Spence Dux, PA-C  potassium chloride  SA (KLOR-CON  M) 20 MEQ tablet Take 2 tablets (40 mEq total) by mouth daily for 7 days. 05/11/22 07/29/23  Dalene Duck, MD  promethazine  (PHENERGAN ) 25 MG tablet Take 1 tablet (25 mg total) by mouth every 6 (six) hours as needed for nausea or vomiting. 05/11/22   [name], MD  saccharomyces boulardii (FLORASTOR) 250 MG capsule Take 1 capsule (250 mg total) by mouth 2 (two) times daily. Patient not taking: Reported  on 07/29/2023 11/05/21   [name], [name], DO  triamcinolone  ointment (KENALOG ) 0.1 % Apply 1 Application topically 2 (two) times daily. 05/22/23 05/21/24  [provider]      Allergies    Hydrocodone , Iodinated contrast media, Cephalexin , Cetirizine & related, Toradol  [ketorolac  tromethamine ], Flagyl  [metronidazole ], and Nsaids    Review of Systems   Review of Systems  Constitutional:  Positive for fatigue. Negative for chills and fever.  HENT:  Negative for congestion.   Respiratory:  Negative for cough, chest tightness, shortness of breath and wheezing.   Cardiovascular:  Negative for chest pain, palpitations and leg swelling.  Gastrointestinal:  Positive for abdominal distention, abdominal pain, constipation, nausea and vomiting. Negative for diarrhea.  Genitourinary:  Positive for decreased urine volume (darker). Negative for dysuria, flank pain, vaginal bleeding and vaginal discharge.  Musculoskeletal:  Positive for back pain. Negative for neck pain and neck stiffness.  Skin:  Negative for rash.  Neurological:  Negative for headaches.  Psychiatric/Behavioral:  Negative for agitation.   All other systems reviewed and are negative.   Physical Exam Updated Vital Signs BP 123/74 (BP Location: Right Arm)   Pulse 97   Temp 97.9 F (36.6 C)   Resp 19   Ht 5\' 7"  (1.702 m)   Wt 73 kg   SpO2 97%   BMI 25.21 kg/m  Physical Exam Vitals and nursing note reviewed.  Constitutional:      General: She is not in acute distress.    Appearance: She is well-developed. She is not ill-appearing, toxic-appearing or diaphoretic.  HENT:     Head: Normocephalic and atraumatic.  Eyes:     Conjunctiva/sclera: Conjunctivae normal.  Cardiovascular:     Rate and Rhythm: Normal rate and regular rhythm.     Heart sounds: No murmur heard. Pulmonary:     Effort: Pulmonary effort is normal. No respiratory distress.     Breath sounds: Normal breath sounds.  Abdominal:     General: There is  distension.     Palpations: Abdomen is soft.     Tenderness: There is generalized abdominal tenderness. There is no right CVA tenderness, left CVA tenderness, guarding or rebound.  Musculoskeletal:        General: No swelling.     Cervical back: Neck supple.  Skin:    General: Skin is warm and dry.     Capillary Refill: Capillary refill takes less than 2 seconds.     Coloration: Skin is not pale.     Findings: No rash.  Neurological:     General: No focal deficit present.     Mental Status: She is alert.  Psychiatric:        Mood and Affect: Mood normal.     ED Results / Procedures / Treatments   Labs (all labs ordered are listed, but only abnormal results are displayed) Labs Reviewed  COMPREHENSIVE METABOLIC PANEL WITH GFR - Abnormal; Notable for the following components:  Result Value   Glucose, Bld 105 (*)    All other components within normal limits  URINALYSIS, ROUTINE W REFLEX MICROSCOPIC - Abnormal; Notable for the following components:   Color, Urine AMBER (*)    APPearance HAZY (*)    Ketones, ur 20 (*)    Protein, ur 30 (*)    Bacteria, UA RARE (*)    All other components within normal limits  LIPASE, BLOOD  CBC  HCG, SERUM, QUALITATIVE  I-STAT CG4 LACTIC ACID, ED  I-STAT CG4 LACTIC ACID, ED    EKG None  Radiology CT ABDOMEN PELVIS WO CONTRAST Result Date: 03/31/2024 CLINICAL DATA:  Bowel obstruction suspected Prior bowel obstruction requiring surgery, 4 days of no bowel movement, no flatus, abdominal distention, severe pain, and vomiting. Feels like prior bowel obstruction. Has a contrast allergy so we will avoid contrast. EXAM: CT ABDOMEN AND PELVIS WITHOUT CONTRAST TECHNIQUE: Multidetector CT imaging of the abdomen and pelvis was performed following the standard protocol without IV contrast. RADIATION DOSE REDUCTION: This exam was performed according to the departmental dose-optimization program which includes automated exposure control, adjustment of the  mA and/or kV according to patient size and/or use of iterative reconstruction technique. COMPARISON:  None Available. FINDINGS: Lower chest: No acute abnormality. Hepatobiliary: No focal liver abnormality. No gallstones, gallbladder wall thickening, or pericholecystic fluid. No biliary dilatation. Pancreas: No focal lesion. Normal pancreatic contour. No surrounding inflammatory changes. No main pancreatic ductal dilatation. Spleen: Normal in size without focal abnormality. Adrenals/Urinary Tract: There is a stable 2.9 x 2 cm right adrenal gland nodule with a density of 19 Hounsfield units-finding likely represents an adrenal adenoma, no further follow-up indicated. No left adrenal gland nodule. No nephrolithiasis and no hydronephrosis. No definite contour-deforming renal mass. No ureterolithiasis or hydroureter. The urinary bladder is unremarkable. Stomach/Bowel: Stomach is within normal limits. No evidence of bowel wall thickening or dilatation. The appendix is not definitely identified with no inflammatory changes in the right lower quadrant to suggest acute appendicitis. Vascular/Lymphatic: No abdominal aorta or iliac aneurysm. No abdominal, pelvic, or inguinal lymphadenopathy. Reproductive: T-shaped intrauterine vice within the uterus in grossly appropriate position. Uterus and bilateral adnexa are unremarkable. Other: No intraperitoneal free fluid. No intraperitoneal free gas. No organized fluid collection. Musculoskeletal: No abdominal wall hernia or abnormality. No suspicious lytic or blastic osseous lesions. No acute displaced fracture. IMPRESSION: No acute intra-abdominal or intrapelvic abnormality with limited evaluation on this noncontrast study. Electronically Signed   By: Morgane  Naveau M.D.   On: 03/31/2024 15:18    Procedures Procedures    Medications Ordered in ED Medications  sodium chloride  0.9 % bolus 1,000 mL (0 mLs Intravenous Stopped 03/31/24 1538)  ondansetron  (ZOFRAN ) injection 4 mg  (4 mg Intravenous Given 03/31/24 1341)  HYDROmorphone  (DILAUDID ) injection 1 mg (1 mg Intramuscular Given 03/31/24 1230)  HYDROmorphone  (DILAUDID ) injection 1 mg (1 mg Intravenous Given 03/31/24 1342)    ED Course/ Medical Decision Making/ A&P                                 Medical Decision Making Amount and/or Complexity of Data Reviewed Labs: ordered. Radiology: ordered.  Risk Prescription drug management.    Saleena JOLIYAH LIPPENS is a 38 y.o. female with a past medical history significant for diabetes, previous pulmonary embolism, GERD, and previous small bowel obstruction status post surgery who presents with worsening abdominal pain, nausea, vomiting, absent flatus, decreased bowel movement.  According to patient, for the last 4 days, patient has had no bowel movements and has not passed any gas.  She reports that she is having a 10 out of 10 pain across her abdomen that feels the same as when she had a previous bowel obstruction and had to have surgery.  She reports nausea and vomiting and anything she eats comes back up.  She reports feeling fatigued and tired.  She denies any chest pain or shortness of breath.  She denies any dysuria but does report her urine is darker.  She denies any extremity pains and denies any trauma.  Denies any sick contacts.  On exam, lungs clear.  Chest nontender.  Abdomen is diffusely tender and she reports it is distended.  I did hear some bowel sounds.  Back slightly tender in the CVA areas but midline nontender.  No focal neurologic deficits.  Patient has dry mucous membranes.  Given the patient's history of previous surgery for bowel obstruction and her symptoms I am concerned about ruling out recurrent bowel obstruction.  We will get CT abdomen pelvis with contrast and will get labs.  Will get urinalysis given the urinary changes.  Will give her pain medicine, nausea, and fluids.  Anticipate reassessment after workup to determine disposition.     Workup  returned surprisingly reassuring.  Patient was somewhat dehydrated with ketones in the urine but no convincing evidence of UTI with no nitrites or leukocytes.  Her CBC and CMP were reassuring.  Lipase not elevated.  She is not pregnant.  Her CT scan returned without any clear evidence of obstruction.  Patient reassessed and her symptoms are beginning to improved.  We discussed that I even called and spoke with radiology who reviewed the case a second time and did not feel there was any obstruction or partial obstruction whatsoever.  They feel that if she is having persistent or worsened symptoms she would need to do a CT with contrast and do the pretreatment for contrast but patient said she did not want to do that.  Given her improvement now, will send home with physician for pain medicine and nausea medicine.  This could be a viral gastroenteritis that on her already sensitive previously surgical abdomen could have caused the discomfort.  Patient agrees with this plan.  She will rest and stay hydrated and use the medicines and follow-up with her primary doctor.  She had no other questions or concerns and will be discharged for outpatient follow-up.         Final Clinical Impression(s) / ED Diagnoses Final diagnoses:  Generalized abdominal pain  Nausea and vomiting, unspecified vomiting type  Constipation, unspecified constipation type    Rx / DC Orders ED Discharge Orders          Ordered    oxyCODONE -acetaminophen  (PERCOCET/ROXICET) 5-325 MG tablet  Every 4 hours PRN        03/31/24 1619    ondansetron  (ZOFRAN -ODT) 4 MG disintegrating tablet  Every 8 hours PRN        03/31/24 1619            Clinical Impression: 1. Generalized abdominal pain   2. Nausea and vomiting, unspecified vomiting type   3. Constipation, unspecified constipation type     Disposition: Discharge  Condition: Good  I have discussed the results, Dx and Tx plan with the pt(& family if present).  He/she/they expressed understanding and agree(s) with the plan. Discharge instructions discussed at great length. Strict return precautions  discussed and pt &/or family have verbalized understanding of the instructions. No further questions at time of discharge.    New Prescriptions   ONDANSETRON  (ZOFRAN -ODT) 4 MG DISINTEGRATING TABLET    Take 1 tablet (4 mg total) by mouth every 8 (eight) hours as needed for nausea or vomiting.   OXYCODONE -ACETAMINOPHEN  (PERCOCET/ROXICET) 5-325 MG TABLET    Take 1 tablet by mouth every 4 (four) hours as needed for severe pain (pain score 7-10).    Follow Up: No follow-up provider specified.     Benyamin Jeff, Marine Sia, MD 03/31/24 1622    Zlata Alcaide, Marine Sia, MD 03/31/24 [phone]

## 2024-03-31 NOTE — Discharge Instructions (Signed)
 Your history, exam, workup today did not show clear evidence of bowel obstruction.  Based on your story we were worried about this and the CT image was reassuring.  I spoke with radiology myself about it.  Your labs are overall reassuring and we feel you are safe for discharge home.  There was no evidence of urinary tract infection at this time either.  Please rest and stay hydrated and use the medicines to help with your symptoms.  Please follow-up with your primary doctor.  If any symptoms change or worsen acutely, please turn to the nearest emergency department.

## 2024-05-24 ENCOUNTER — Encounter (HOSPITAL_COMMUNITY): Payer: Self-pay

## 2024-05-24 ENCOUNTER — Emergency Department (HOSPITAL_COMMUNITY)
Admission: EM | Admit: 2024-05-24 | Discharge: 2024-05-24 | Disposition: A | Discharge status: Home / Self Care | Attending: Emergency Medicine | Admitting: Emergency Medicine

## 2024-05-24 ENCOUNTER — Emergency Department (HOSPITAL_COMMUNITY)

## 2024-05-24 ENCOUNTER — Other Ambulatory Visit: Payer: Self-pay

## 2024-05-24 DIAGNOSIS — Z7984 Long term (current) use of oral hypoglycemic drugs: Secondary | ICD-10-CM | POA: Insufficient documentation

## 2024-05-24 DIAGNOSIS — R1013 Epigastric pain: Secondary | ICD-10-CM | POA: Diagnosis present

## 2024-05-24 DIAGNOSIS — E119 Type 2 diabetes mellitus without complications: Secondary | ICD-10-CM | POA: Diagnosis not present

## 2024-05-24 DIAGNOSIS — K2900 Acute gastritis without bleeding: Secondary | ICD-10-CM | POA: Insufficient documentation

## 2024-05-24 LAB — COMPREHENSIVE METABOLIC PANEL WITH GFR
ALT: 23 U/L (ref 0–44)
AST: 26 U/L (ref 15–41)
Albumin: 3.8 g/dL (ref 3.5–5.0)
Alkaline Phosphatase: 70 U/L (ref 38–126)
Anion gap: 11 (ref 5–15)
BUN: 10 mg/dL (ref 6–20)
CO2: 25 mmol/L (ref 22–32)
Calcium: 9.3 mg/dL (ref 8.9–10.3)
Chloride: 104 mmol/L (ref 98–111)
Creatinine, Ser: 0.69 mg/dL (ref 0.44–1.00)
GFR, Estimated: >60 mL/min (ref >60)
Glucose, Bld: 165 mg/dL — ABNORMAL HIGH (ref 70–99)
Potassium: 3 mmol/L — ABNORMAL LOW (ref 3.5–5.1)
Sodium: 140 mmol/L (ref 135–145)
Total Bilirubin: 0.7 mg/dL (ref 0.0–1.2)
Total Protein: 8 g/dL (ref 6.5–8.1)

## 2024-05-24 LAB — CBC WITH DIFFERENTIAL/PLATELET
Abs Immature Granulocytes: 0.01 K/uL (ref 0.00–0.07)
Basophils Absolute: 0 K/uL (ref 0.0–0.1)
Basophils Relative: 0 %
Eosinophils Absolute: 0 K/uL (ref 0.0–0.5)
Eosinophils Relative: 0 %
HCT: 42.1 % (ref 36.0–46.0)
Hemoglobin: 14.1 g/dL (ref 12.0–15.0)
Immature Granulocytes: 0 %
Lymphocytes Relative: 10 %
Lymphs Abs: 0.7 K/uL (ref 0.7–4.0)
MCH: 29.6 pg (ref 26.0–34.0)
MCHC: 33.5 g/dL (ref 30.0–36.0)
MCV: 88.3 fL (ref 80.0–100.0)
Monocytes Absolute: 0.2 K/uL (ref 0.1–1.0)
Monocytes Relative: 3 %
Neutro Abs: 5.8 K/uL (ref 1.7–7.7)
Neutrophils Relative %: 87 %
Platelets: 297 K/uL (ref 150–400)
RBC: 4.77 MIL/uL (ref 3.87–5.11)
RDW: 13 % (ref 11.5–15.5)
WBC: 6.8 K/uL (ref 4.0–10.5)
nRBC: 0 % (ref 0.0–0.2)

## 2024-05-24 LAB — URINALYSIS, ROUTINE W REFLEX MICROSCOPIC
Bilirubin Urine: NEGATIVE
Glucose, UA: NEGATIVE mg/dL
Hgb urine dipstick: NEGATIVE
Ketones, ur: 5 mg/dL — AB
Leukocytes,Ua: NEGATIVE
Nitrite: NEGATIVE
Protein, ur: 100 mg/dL — AB
Specific Gravity, Urine: 1.02 (ref 1.005–1.030)
pH: 7 (ref 5.0–8.0)

## 2024-05-24 LAB — HCG, SERUM, QUALITATIVE: Preg, Serum: NEGATIVE

## 2024-05-24 LAB — LIPASE, BLOOD: Lipase: 24 U/L (ref 11–51)

## 2024-05-24 LAB — MAGNESIUM: Magnesium: 1.7 mg/dL (ref 1.7–2.4)

## 2024-05-24 LAB — ETHANOL: Alcohol, Ethyl (B): <15 mg/dL (ref <15)

## 2024-05-24 MED ORDER — HYOSCYAMINE SULFATE 0.125 MG SL SUBL
0.2500 mg | SUBLINGUAL_TABLET | Freq: Once | SUBLINGUAL | Status: AC
  Administered 2024-05-24: 0.25 mg via SUBLINGUAL
  Filled 2024-05-24: qty 2

## 2024-05-24 MED ORDER — FENTANYL CITRATE PF 50 MCG/ML IJ SOSY
50.0000 ug | PREFILLED_SYRINGE | Freq: Once | INTRAMUSCULAR | Status: AC
  Administered 2024-05-24: 50 ug via INTRAMUSCULAR
  Filled 2024-05-24: qty 1

## 2024-05-24 MED ORDER — PANTOPRAZOLE SODIUM 40 MG IV SOLR
40.0000 mg | Freq: Once | INTRAVENOUS | Status: AC
  Administered 2024-05-24: 40 mg via INTRAVENOUS
  Filled 2024-05-24: qty 10

## 2024-05-24 MED ORDER — ONDANSETRON HCL 4 MG/2ML IJ SOLN
4.0000 mg | Freq: Once | INTRAMUSCULAR | Status: AC
  Administered 2024-05-24: 4 mg via INTRAVENOUS
  Filled 2024-05-24: qty 2

## 2024-05-24 MED ORDER — ONDANSETRON 4 MG PO TBDP: 4.0000 mg | ORAL_TABLET | Freq: Three times a day (TID) | ORAL | 0 refills | Status: DC | PRN

## 2024-05-24 MED ORDER — OXYCODONE-ACETAMINOPHEN 5-325 MG PO TABS: 1.0000 | ORAL_TABLET | Freq: Three times a day (TID) | ORAL | 0 refills | Status: AC | PRN

## 2024-05-24 MED ORDER — POTASSIUM CHLORIDE CRYS ER 20 MEQ PO TBCR
40.0000 meq | EXTENDED_RELEASE_TABLET | Freq: Once | ORAL | Status: AC
Start: 2024-05-24 — End: 2024-05-24
  Administered 2024-05-24: 40 meq via ORAL
  Filled 2024-05-24: qty 2

## 2024-05-24 MED ORDER — ALUM & MAG HYDROXIDE-SIMETH 200-200-20 MG/5ML PO SUSP
30.0000 mL | Freq: Once | ORAL | Status: AC
  Administered 2024-05-24: 30 mL via ORAL
  Filled 2024-05-24: qty 30

## 2024-05-24 MED ORDER — OXYCODONE-ACETAMINOPHEN 5-325 MG PO TABS
1.0000 | ORAL_TABLET | Freq: Once | ORAL | Status: AC
  Administered 2024-05-24: 1 via ORAL
  Filled 2024-05-24: qty 1

## 2024-05-24 MED ORDER — SODIUM CHLORIDE 0.9 % IV BOLUS
1000.0000 mL | Freq: Once | INTRAVENOUS | Status: AC
  Administered 2024-05-24: 1000 mL via INTRAVENOUS

## 2024-05-24 MED ORDER — HYDROMORPHONE HCL 1 MG/ML IJ SOLN
1.0000 mg | Freq: Once | INTRAMUSCULAR | Status: AC
  Administered 2024-05-24: 1 mg via INTRAVENOUS
  Filled 2024-05-24: qty 1

## 2024-05-24 NOTE — ED Notes (Signed)
 Was advised by lab staff that she needed a recollect. Phlebotomy went to obtain blood but patient refused to be stuck.

## 2024-05-24 NOTE — ED Notes (Signed)
 Went to attempt IV, patient rude and yelling at me while attempting to do so. Patient asked not to yell, to which she responded I am in pain. Patient advised being in pain is not a reason to be rude to staff. IV team order placed

## 2024-05-24 NOTE — ED Triage Notes (Signed)
 BIB EMS from home for abdominal pain that started yesterday and worsened this AM at 0330. Hx of pancreatitis and alcohol abuse.

## 2024-05-24 NOTE — ED Provider Notes (Signed)
 [place] [facility]GENCY DEPARTMENT AT [facility] Provider Note   CSN: [id number] Arrival date & time: 05/24/24  9356     Patient presents with: Abdominal Pain   Jodi Mcgee is a 38 y.o. female.  Patient with past medical history of type 2 diabetes, history of pulmonary embolism during puerperium, pancreatitis, small bowel obstruction presents emergency room with complaint of abdominal pain.  Patient reports this been going on for 2 or 3 days, significantly worse last night about 4 hours ago.  She localizes pain to the entire upper abdomen.  This started after eating a hamburger at [facility].  Does not seem to radiate up to her chest or back.  She denies chest pain shortness of breath cough or fever.  Is having nausea and vomiting.  Denies any diarrhea or constipation, is passing gas.    Abdominal Pain      Prior to Admission medications   Medication Sig Start Date End Date Taking? Authorizing Provider  amLODipine  (NORVASC ) 5 MG tablet Take 1 tablet (5 mg total) by mouth daily. 10/28/21   Samtani, Jai-Gurmukh, MD  blood glucose meter kit and supplies KIT Dispense based on patient and insurance preference. Use up to four times daily as directed. (FOR ICD-9 250.00, 250.01). 02/21/19   Briana Avena D, MD  colestipol  (COLESTID ) 5 g granules Take 5 g by mouth 2 (two) times daily. Patient not taking: Reported on 07/29/2023 11/24/21 12/24/21  Hildegard Loge, PA-C  dicyclomine  (BENTYL ) 20 MG tablet Take 1 tablet (20 mg total) by mouth 2 (two) times daily. 07/09/22   Hildegard Loge, PA-C  feeding supplement, GLUCERNA SHAKE, (GLUCERNA SHAKE) LIQD Take 237 mLs by mouth 3 (three) times daily between meals. 11/05/21   [name], [name], DO  gabapentin (NEURONTIN) 300 MG capsule Take 900 mg by mouth 3 (three) times daily. 07/16/23   [provider]  HUMULIN  R 100 UNIT/ML injection Inject 20 Units into the skin 3 (three) times daily before meals. Per sliding scale 03/24/20   [provider]   hyoscyamine  (LEVSIN  SL) 0.125 MG SL tablet Place 1 tablet (0.125 mg total) under the tongue every 4 (four) hours as needed for cramping (abdominal pain). 01/26/24   Haze Lonni PARAS, MD  Levonorgestrel  (LILETTA ) 19.5 MCG/DAY IUD IUD 1 each by Intrauterine route continuous. Placed in 2019    [provider]  lidocaine  (LIDODERM ) 5 % Place 1 patch onto the skin daily. Remove & Discard patch within 12 hours or as directed by MD 12/08/21   Tegeler, Lonni PARAS, MD  metoCLOPramide  (REGLAN ) 10 MG tablet Take 1 tablet (10 mg total) by mouth every 6 (six) hours. 07/29/23   Logan Ubaldo NOVAK, PA-C  nicotine  (NICODERM CQ  - DOSED IN MG/24 HR) 7 mg/24hr patch Place 1 patch (7 mg total) onto the skin daily. 10/28/21   Samtani, Jai-Gurmukh, MD  ondansetron  (ZOFRAN -ODT) 4 MG disintegrating tablet Take 1 tablet (4 mg total) by mouth every 8 (eight) hours as needed. 07/09/22   Hildegard, Amjad, PA-C  ondansetron  (ZOFRAN -ODT) 4 MG disintegrating tablet Take 1 tablet (4 mg total) by mouth every 8 (eight) hours as needed for nausea or vomiting. 03/31/24   Tegeler, Lonni PARAS, MD  oxyCODONE  (OXY IR/ROXICODONE ) 5 MG immediate release tablet Take 1 tablet (5 mg total) by mouth every 4 (four) hours as needed for severe pain. 10/28/21   Samtani, Jai-Gurmukh, MD  oxyCODONE -acetaminophen  (PERCOCET/ROXICET) 5-325 MG tablet Take 1 tablet by mouth every 8 (eight) hours as needed. 07/16/23 07/15/24  [provider]  oxyCODONE -acetaminophen  (PERCOCET/ROXICET) 5-325 MG tablet Take 1 tablet by mouth every 4 (four) hours as needed for severe pain (pain score 7-10). 03/31/24   Tegeler, Lonni PARAS, MD  pantoprazole  (PROTONIX ) 20 MG tablet Take 1 tablet (20 mg total) by mouth daily. 04/03/23   Bernis Ernst, PA-C  potassium chloride  SA (KLOR-CON  M) 20 MEQ tablet Take 2 tablets (40 mEq total) by mouth daily for 7 days. 05/11/22 07/29/23  Patt Alm Macho, MD  promethazine  (PHENERGAN ) 25 MG tablet Take 1 tablet (25 mg total) by  mouth every 6 (six) hours as needed for nausea or vomiting. 05/11/22   Levander Houston, MD  saccharomyces boulardii (FLORASTOR) 250 MG capsule Take 1 capsule (250 mg total) by mouth 2 (two) times daily. Patient not taking: Reported on 07/29/2023 11/05/21   [name], [name], DO    Allergies: Hydrocodone , Iodinated contrast media, Cephalexin , Cetirizine & related, Toradol  [ketorolac  tromethamine ], Flagyl  [metronidazole ], and Nsaids    Review of Systems  Gastrointestinal:  Positive for abdominal pain.    Updated Vital Signs BP (!) 161/149   Pulse (!) 104   Temp 97.8 F (36.6 C) (Oral) Comment (Src): Simultaneous filing. User may not have seen previous data.  Resp (!) 24   Ht 5' 7 (1.702 m)   Wt 73 kg   SpO2 100%   BMI 25.21 kg/m   Physical Exam Vitals and nursing note reviewed.  Constitutional:      General: She is not in acute distress.    Appearance: She is not toxic-appearing.  HENT:     Head: Normocephalic and atraumatic.  Eyes:     General: No scleral icterus.    Conjunctiva/sclera: Conjunctivae normal.  Cardiovascular:     Rate and Rhythm: Normal rate and regular rhythm.     Pulses: Normal pulses.     Heart sounds: Normal heart sounds.  Pulmonary:     Effort: Pulmonary effort is normal. No respiratory distress.     Breath sounds: Normal breath sounds.  Abdominal:     General: Abdomen is flat. Bowel sounds are normal.     Palpations: Abdomen is soft.     Tenderness: There is abdominal tenderness in the epigastric area.  Skin:    General: Skin is warm and dry.     Findings: No lesion.  Neurological:     General: No focal deficit present.     Mental Status: She is alert and oriented to person, place, and time. Mental status is at baseline.  Psychiatric:        Mood and Affect: Mood is anxious.     Comments: Tearful     (all labs ordered are listed, but only abnormal results are displayed) Labs Reviewed  URINALYSIS, ROUTINE W REFLEX MICROSCOPIC - Abnormal; Notable for  the following components:      Result Value   Color, Urine AMBER (*)    APPearance CLOUDY (*)    Ketones, ur 5 (*)    Protein, ur 100 (*)    Bacteria, UA RARE (*)    All other components within normal limits  COMPREHENSIVE METABOLIC PANEL WITH GFR - Abnormal; Notable for the following components:   Potassium 3.0 (*)    Glucose, Bld 165 (*)    All other components within normal limits  ETHANOL  HCG, SERUM, QUALITATIVE  CBC WITH DIFFERENTIAL/PLATELET  LIPASE, BLOOD  MAGNESIUM   CBC WITH DIFFERENTIAL/PLATELET    EKG: None  Radiology: CT ABDOMEN PELVIS WO CONTRAST Result Date: 05/24/2024 CLINICAL DATA:  38 year old female with acute abdominal pain. EXAM: CT ABDOMEN AND PELVIS WITHOUT CONTRAST TECHNIQUE: Multidetector CT imaging of the abdomen and pelvis was performed following the standard protocol without IV contrast. RADIATION DOSE REDUCTION: This exam was performed according to the departmental dose-optimization program which includes automated exposure control, adjustment of the mA and/or kV according to patient size and/or use of iterative reconstruction technique. COMPARISON:  Noncontrast CT Abdomen and Pelvis 03/31/2024 and earlier. FINDINGS: Lower chest: Negative. Normal heart size and no pericardial or pleural effusion. Hepatobiliary: Negative noncontrast liver and gallbladder. Pancreas: Negative. Spleen: Negative. Adrenals/Urinary Tract: Chronic benign right adrenal adenoma (no follow-up imaging recommended). Negative left adrenal gland. Noncontrast kidneys appear stable and negative. No hydronephrosis or nephrolithiasis. Diminutive ureters. Diminutive and unremarkable urinary bladder. Stomach/Bowel: Small surgical clips along the distal descending colon is stable. Nondilated large bowel with negative noncontrast appearance. Diminutive or absent appendix. Nondilated small bowel. Chronic postoperative changes to the ventral abdominal wall are stable. Small volume retained fluid in the  stomach. Negative duodenum. No pneumoperitoneum. No free fluid or inflammatory stranding identified. Vascular/Lymphatic: Normal caliber abdominal aorta. Minimal calcified iliac artery atherosclerosis. Vascular patency is not evaluated in the absence of IV contrast. No lymphadenopathy identified. Reproductive: Stable IUD, no adverse features. Noncontrast appearance otherwise within normal limits. Other: No pelvis free fluid. Musculoskeletal: Stable and negative. IMPRESSION: No acute or inflammatory process identified in the noncontrast abdomen or pelvis. No evidence of urinary calculi. Electronically Signed   By: VEAR Hurst M.D.   On: 05/24/2024 10:59     Procedures   Medications Ordered in the ED  sodium chloride  0.9 % bolus 1,000 mL (has no administration in time range)  pantoprazole  (PROTONIX ) injection 40 mg (has no administration in time range)  HYDROmorphone  (DILAUDID ) injection 1 mg (has no administration in time range)  ondansetron  (ZOFRAN ) injection 4 mg (has no administration in time range)    Clinical Course as of 05/24/24 1159  Sat May 24, 2024  0834 Apparently refusing labs.  Agreeable to the IV team start IV get labs then.  [JB]  1158 Patient tolerating oral intake.  Expresses she is feeling much better and is requesting discharge. [JB]    Clinical Course User Index [JB] Dayson Aboud, Warren SAILOR, PA-C                                 Medical Decision Making Amount and/or Complexity of Data Reviewed Labs: ordered. Radiology: ordered.  Risk OTC drugs. Prescription drug management.   This patient presents to the ED for concern of abdominal pain, this involves an extensive number of treatment options, and is a complaint that carries with it a high risk of complications and morbidity.  The differential diagnosis includes gastritis, PUD, pancreatitis, SBO, cholecystitis   Co morbidities that complicate the patient evaluation  Type 2 diabetes, history of pulmonary embolism during  puerperium, pancreatitis, small bowel obstruction    Additional history obtained:  Additional history obtained from ED visit 03/31/24 for similar complaint.    Lab Tests:  I personally interpreted labs.  The pertinent results include:   CBC without leukocytosis.  No anemia.  Potassium is 3.0 which was supplemented orally.  Magnesium  1.7.  Lipase and EtOH negative.   Imaging Studies ordered:  Prior to ordering imaging we discussed symptomatic management instead of getting CT scan due to numerous CT scans in the past and clinically seems like gastritis/ GI bug. Patient expressed that  this was very severe pain and decided to proceed with CT scan. I ordered imaging studies including CT scan of abdomen and pelvis I independently visualized and interpreted imaging which showed no acute findings I agree with the radiologist interpretation   Cardiac Monitoring: / EKG:  The patient was maintained on a cardiac monitor.     Problem List / ED Course / Critical interventions / Medication management  Patient presents to emergency room with complaint of abdominal pain nausea and vomiting.  Ongoing for 3 days and started after eating suspicious food at [facility].  On arrival she is hemodynamically stable and well-appearing.  On my exam she has no focal area of abdominal tenderness to palpation.  She does localize her pain to epigastric and left upper quadrant.  We discussed symptomatic management and reassess before ordering CT scan. Patient's labs are overall reassuring.  She does have potassium of 3.0 which was supplemented orally.  I did check magnesium  which was normal.  She was given pain medicine, Protonix , Zofran  and 1 L without significant improvement in symptoms.  Thus given GI cocktail and patient decided she would like to proceed with imaging.  CT scan negative for acute abdominal pathology.  At this point patient is feeling better tolerating oral intake.  Will give her GI follow-up and send  her home with symptomatic management. I ordered medication including NS, zofran , Protonix   Reevaluation of the patient after these medicines showed that the patient improved I have reviewed the patients home medicines and have made adjustments as needed Patient stable.  Discharged in good condition.      Final diagnoses:  Acute gastritis without hemorrhage, unspecified gastritis type    ED Discharge Orders     None          Shermon Warren LOISE DEVONNA 05/24/24 1202    Melvenia Motto, MD 05/24/24 1259

## 2024-05-24 NOTE — ED Notes (Signed)
 Patient transported to CT

## 2024-05-24 NOTE — Discharge Instructions (Signed)
 Your CT scan and labs are overall reassuring today.  I would recommend Zofran  as needed for nausea vomiting.  Try bland foods.  Make sure staying well-hydrated with water he can alternate Pedialyte or Gatorade.  Or ibuprofen .  Take Norco for breakthrough pain.  Return to emergency room with new or worsening symptoms

## 2024-09-02 ENCOUNTER — Encounter (HOSPITAL_COMMUNITY): Payer: Self-pay

## 2024-09-02 ENCOUNTER — Emergency Department (HOSPITAL_COMMUNITY)

## 2024-09-02 ENCOUNTER — Emergency Department (HOSPITAL_COMMUNITY)
Admission: EM | Admit: 2024-09-02 | Discharge: 2024-09-02 | Disposition: A | Discharge status: Home / Self Care | Attending: Emergency Medicine | Admitting: Emergency Medicine

## 2024-09-02 ENCOUNTER — Other Ambulatory Visit: Payer: Self-pay

## 2024-09-02 DIAGNOSIS — E119 Type 2 diabetes mellitus without complications: Secondary | ICD-10-CM | POA: Diagnosis not present

## 2024-09-02 DIAGNOSIS — N83201 Unspecified ovarian cyst, right side: Secondary | ICD-10-CM | POA: Insufficient documentation

## 2024-09-02 DIAGNOSIS — R112 Nausea with vomiting, unspecified: Secondary | ICD-10-CM | POA: Diagnosis present

## 2024-09-02 DIAGNOSIS — I10 Essential (primary) hypertension: Secondary | ICD-10-CM | POA: Insufficient documentation

## 2024-09-02 DIAGNOSIS — Z79899 Other long term (current) drug therapy: Secondary | ICD-10-CM | POA: Diagnosis not present

## 2024-09-02 DIAGNOSIS — E876 Hypokalemia: Secondary | ICD-10-CM | POA: Insufficient documentation

## 2024-09-02 DIAGNOSIS — R1013 Epigastric pain: Secondary | ICD-10-CM

## 2024-09-02 LAB — CBC WITH DIFFERENTIAL/PLATELET
Abs Immature Granulocytes: 0.01 K/uL (ref 0.00–0.07)
Basophils Absolute: 0 K/uL (ref 0.0–0.1)
Basophils Relative: 0 %
Eosinophils Absolute: 0 K/uL (ref 0.0–0.5)
Eosinophils Relative: 1 %
HCT: 42.4 % (ref 36.0–46.0)
Hemoglobin: 13.8 g/dL (ref 12.0–15.0)
Immature Granulocytes: 0 %
Lymphocytes Relative: 20 %
Lymphs Abs: 1.1 K/uL (ref 0.7–4.0)
MCH: 28.8 pg (ref 26.0–34.0)
MCHC: 32.5 g/dL (ref 30.0–36.0)
MCV: 88.3 fL (ref 80.0–100.0)
Monocytes Absolute: 0.3 K/uL (ref 0.1–1.0)
Monocytes Relative: 5 %
Neutro Abs: 4.1 K/uL (ref 1.7–7.7)
Neutrophils Relative %: 74 %
Platelets: 312 K/uL (ref 150–400)
RBC: 4.8 MIL/uL (ref 3.87–5.11)
RDW: 13 % (ref 11.5–15.5)
WBC: 5.5 K/uL (ref 4.0–10.5)
nRBC: 0 % (ref 0.0–0.2)

## 2024-09-02 LAB — COMPREHENSIVE METABOLIC PANEL WITH GFR
ALT: 16 U/L (ref 0–44)
AST: 17 U/L (ref 15–41)
Albumin: 3.6 g/dL (ref 3.5–5.0)
Alkaline Phosphatase: 75 U/L (ref 38–126)
Anion gap: 11 (ref 5–15)
BUN: 11 mg/dL (ref 6–20)
CO2: 23 mmol/L (ref 22–32)
Calcium: 8.8 mg/dL — ABNORMAL LOW (ref 8.9–10.3)
Chloride: 101 mmol/L (ref 98–111)
Creatinine, Ser: 0.77 mg/dL (ref 0.44–1.00)
GFR, Estimated: >60 mL/min (ref >60)
Glucose, Bld: 114 mg/dL — ABNORMAL HIGH (ref 70–99)
Potassium: 3 mmol/L — ABNORMAL LOW (ref 3.5–5.1)
Sodium: 135 mmol/L (ref 135–145)
Total Bilirubin: 1.3 mg/dL — ABNORMAL HIGH (ref 0.0–1.2)
Total Protein: 7.5 g/dL (ref 6.5–8.1)

## 2024-09-02 LAB — TYPE AND SCREEN
ABO/RH(D): O POS
Antibody Screen: NEGATIVE

## 2024-09-02 LAB — LIPASE, BLOOD: Lipase: 25 U/L (ref 11–51)

## 2024-09-02 MED ORDER — HYDROMORPHONE HCL 1 MG/ML IJ SOLN
1.0000 mg | Freq: Once | INTRAMUSCULAR | Status: AC
  Administered 2024-09-02: 1 mg via INTRAVENOUS
  Filled 2024-09-02: qty 1

## 2024-09-02 MED ORDER — LACTATED RINGERS IV BOLUS
1000.0000 mL | Freq: Once | INTRAVENOUS | Status: AC
  Administered 2024-09-02: 1000 mL via INTRAVENOUS

## 2024-09-02 MED ORDER — ONDANSETRON HCL 4 MG/2ML IJ SOLN
4.0000 mg | Freq: Once | INTRAMUSCULAR | Status: AC
  Administered 2024-09-02: 4 mg via INTRAVENOUS
  Filled 2024-09-02: qty 2

## 2024-09-02 MED ORDER — PANTOPRAZOLE SODIUM 40 MG IV SOLR
40.0000 mg | Freq: Once | INTRAVENOUS | Status: AC
  Administered 2024-09-02: 40 mg via INTRAVENOUS
  Filled 2024-09-02: qty 10

## 2024-09-02 MED ORDER — IOHEXOL 350 MG/ML SOLN
75.0000 mL | Freq: Once | INTRAVENOUS | Status: AC | PRN
  Administered 2024-09-02: 75 mL via INTRAVENOUS

## 2024-09-02 MED ORDER — DIPHENHYDRAMINE HCL 50 MG/ML IJ SOLN
25.0000 mg | Freq: Once | INTRAMUSCULAR | Status: AC
  Administered 2024-09-02: 25 mg via INTRAVENOUS
  Filled 2024-09-02: qty 1

## 2024-09-02 MED ORDER — ONDANSETRON HCL 4 MG/2ML IJ SOLN
INTRAMUSCULAR | Status: AC
  Filled 2024-09-02: qty 2

## 2024-09-02 MED ORDER — MORPHINE SULFATE (PF) 4 MG/ML IV SOLN
4.0000 mg | Freq: Once | INTRAVENOUS | Status: AC
  Administered 2024-09-02: 4 mg via INTRAVENOUS
  Filled 2024-09-02: qty 1

## 2024-09-02 MED ORDER — ONDANSETRON HCL 4 MG/2ML IJ SOLN
4.0000 mg | Freq: Once | INTRAMUSCULAR | Status: AC
  Administered 2024-09-02: 4 mg via INTRAVENOUS

## 2024-09-02 MED ORDER — ONDANSETRON 4 MG PO TBDP: 4.0000 mg | ORAL_TABLET | Freq: Three times a day (TID) | ORAL | 0 refills | Status: AC | PRN

## 2024-09-02 MED ORDER — DICYCLOMINE HCL 20 MG PO TABS: 20.0000 mg | ORAL_TABLET | Freq: Two times a day (BID) | ORAL | 0 refills | Status: AC

## 2024-09-02 MED ORDER — POTASSIUM CHLORIDE CRYS ER 20 MEQ PO TBCR
40.0000 meq | EXTENDED_RELEASE_TABLET | Freq: Once | ORAL | Status: AC
  Administered 2024-09-02: 40 meq via ORAL
  Filled 2024-09-02: qty 2

## 2024-09-02 MED ORDER — SODIUM CHLORIDE 0.9 % IV BOLUS
1000.0000 mL | Freq: Once | INTRAVENOUS | Status: AC
  Administered 2024-09-02: 1000 mL via INTRAVENOUS

## 2024-09-02 NOTE — ED Provider Notes (Signed)
 Past Medical History:  Diagnosis Date   Anemia    Anxiety    Blood transfusion without reported diagnosis    both CS   Cocaine abuse (HCC)    Diabetes mellitus without complication (HCC)    GERD (gastroesophageal reflux disease)    has resolved   IBS (irritable bowel syndrome)    Ovarian cyst    Peritonitis, acute generalized (HCC)    Pulmonary embolism (HCC)    SBO (small bowel obstruction) (HCC)    Sepsis (HCC)     Physical Exam  BP (!) 177/125 (BP Location: Right Arm)   Pulse 95   Temp 98.3 F (36.8 C) (Oral)   Resp 18   Ht 5' 7 (1.702 m)   SpO2 100%   BMI 25.21 kg/m   Physical Exam  Procedures  Procedures  ED Course / MDM   Clinical Course as of 09/03/24 1357  Tue Sep 02, 2024  1630 Laboratory workup shows no leukocytosis, elevation in LFTs or elevation lipase.  No drop in H&H or elevation in BUN.  That would be consistent with significant GI bleeding.  Hypokalemia of 3.0.  CT abdomen pelvis shows no acute findings.  Made patient aware of right ovarian cyst and right adrenal cyst but may require follow-up at a later time.  She states she is still having significant abdominal pain and analgesic effect of morphine  did not last for too long.  LILLETTE Ozell Marine DO, am transitioning care of this patient to the oncoming provider pending reevaluation of pain after dose of Dilaudid  and disposition [MP]    Clinical Course User Index [MP] Marine Ozell LABOR, DO   Medical Decision Making Amount and/or Complexity of Data Reviewed Labs: ordered. Radiology: ordered.  Risk Prescription drug management.   Please see Dr. Rik note for prior history, physical and care. Presents with abdominal pain, nausea and vomiting.  CT with ovarian cyst, adrenal mass likely adenoma-needs outpt follow up. Given some RLQ tenderness on exam, performed TVUS to evaluate for torsion. No sign of torsion on US , does have cyst for which recommend outpatient follow up.  Suspect other gastritis,  possible viral infection.  Given rx for bentyl , zofran . Is tolerating po. Patient discharged in stable condition with understanding of reasons to return.        Dreama Longs, MD 09/03/24 1410

## 2024-09-02 NOTE — ED Notes (Signed)
 Pt provided with crackers, peanut butter and ginger ale per pt request and MD verbal order for PO challenge.

## 2024-09-02 NOTE — ED Notes (Signed)
 Pt to [place].

## 2024-09-02 NOTE — ED Provider Notes (Signed)
 This patient had urticaria and pruritus from IV contrast once in 2017.  This resolved after Benadryl .  No other symptoms of severe allergy or anaphylaxis.  We will deviate from the standard premedication IV contrast protocol for contrast allergy.  Will give IV Benadryl  and then IV contrast for CT.  Patient is in agreement   Pamella Ozell LABOR, DO 09/02/24 1228

## 2024-09-02 NOTE — ED Notes (Signed)
 Pt provided w/ d/c and f/u instructions. Pt verbalized understanding. LDA removed, VSS, pt in NAD. Pt ambulatory out of ED w/ all paperwork and belongings, states she is being picked up by her son.

## 2024-09-02 NOTE — ED Provider Notes (Signed)
 [place] [facility]ERGENC[facility] DEPARTMENT AT [facility] Provider Note   CSN: [id number] Arrival date & time: 09/02/24  1138     Patient presents with: Hematemesis   Jodi Mcgee is a 38 y.o. female.With a history of hypertension, type 2 diabetes, DKA, cocaine use and partial SBO who presents to the ED for hematemesis.  2 days of upper abdominal pain with nausea and vomiting.  Reports dark brown-colored blood within emesis today.  Pain localized over entirety of upper abdomen with radiation to the back.  Last bowel movement over a week ago.  Has IUD in place does not have regular menstrual periods.  No vaginal bleeding dysuria or pelvic pain.  Denies fevers and chills  HPI     Prior to Admission medications   Medication Sig Start Date End Date Taking? Authorizing Provider  amLODipine  (NORVASC ) 5 MG tablet Take 1 tablet (5 mg total) by mouth daily. 10/28/21   Samtani, Jai-Gurmukh, MD  blood glucose meter kit and supplies KIT Dispense based on patient and insurance preference. Use up to four times daily as directed. (FOR ICD-9 250.00, 250.01). 02/21/19   Briana Avena D, MD  colestipol  (COLESTID ) 5 g granules Take 5 g by mouth 2 (two) times daily. Patient not taking: Reported on 07/29/2023 11/24/21 12/24/21  Hildegard Loge, PA-C  dicyclomine  (BENTYL ) 20 MG tablet Take 1 tablet (20 mg total) by mouth 2 (two) times daily. 07/09/22   Hildegard Loge, PA-C  feeding supplement, GLUCERNA SHAKE, (GLUCERNA SHAKE) LIQD Take 237 mLs by mouth 3 (three) times daily between meals. 11/05/21   [name], [name], DO  gabapentin (NEURONTIN) 300 MG capsule Take 900 mg by mouth 3 (three) times daily. 07/16/23   [provider]  HUMULIN  R 100 UNIT/ML injection Inject 20 Units into the skin 3 (three) times daily before meals. Per sliding scale 03/24/20   [provider]  hyoscyamine  (LEVSIN  SL) 0.125 MG SL tablet Place 1 tablet (0.125 mg total) under the tongue every 4 (four) hours as needed for cramping (abdominal  pain). 01/26/24   Haze Lonni PARAS, MD  Levonorgestrel  (LILETTA ) 19.5 MCG/DAY IUD IUD 1 each by Intrauterine route continuous. Placed in 2019    [provider]  lidocaine  (LIDODERM ) 5 % Place 1 patch onto the skin daily. Remove & Discard patch within 12 hours or as directed by MD 12/08/21   Tegeler, Lonni PARAS, MD  metoCLOPramide  (REGLAN ) 10 MG tablet Take 1 tablet (10 mg total) by mouth every 6 (six) hours. 07/29/23   Logan Ubaldo NOVAK, PA-C  nicotine  (NICODERM CQ  - DOSED IN MG/24 HR) 7 mg/24hr patch Place 1 patch (7 mg total) onto the skin daily. 10/28/21   Samtani, Jai-Gurmukh, MD  ondansetron  (ZOFRAN -ODT) 4 MG disintegrating tablet Take 1 tablet (4 mg total) by mouth every 8 (eight) hours as needed for nausea or vomiting. 05/24/24   Barrett, Warren SAILOR, PA-C  oxyCODONE  (OXY IR/ROXICODONE ) 5 MG immediate release tablet Take 1 tablet (5 mg total) by mouth every 4 (four) hours as needed for severe pain. 10/28/21   Samtani, Jai-Gurmukh, MD  oxyCODONE -acetaminophen  (PERCOCET/ROXICET) 5-325 MG tablet Take 1 tablet by mouth every 8 (eight) hours as needed for severe pain (pain score 7-10). 05/24/24   Barrett, Jamie N, PA-C  pantoprazole  (PROTONIX ) 20 MG tablet Take 1 tablet (20 mg total) by mouth daily. 04/03/23   Bernis Ernst, PA-C  potassium chloride  SA (KLOR-CON  M) 20 MEQ tablet Take 2 tablets (40 mEq total) by mouth daily for 7 days.  05/11/22 07/29/23  Patt Alm Macho, MD  promethazine  (PHENERGAN ) 25 MG tablet Take 1 tablet (25 mg total) by mouth every 6 (six) hours as needed for nausea or vomiting. 05/11/22   Levander Houston, MD  saccharomyces boulardii (FLORASTOR) 250 MG capsule Take 1 capsule (250 mg total) by mouth 2 (two) times daily. Patient not taking: Reported on 07/29/2023 11/05/21   [name], [name], DO    Allergies: Hydrocodone , Iodinated contrast media, Cephalexin , Cetirizine & related, Toradol  [ketorolac  tromethamine ], Flagyl  [metronidazole ], and Nsaids    Review of Systems  Updated Vital  Signs BP (!) 171/109   Pulse 95   Temp 98.3 F (36.8 C) (Oral)   Resp 12   Ht 5' 7 (1.702 m)   SpO2 100%   BMI 25.21 kg/m   Physical Exam Vitals and nursing note reviewed.  HENT:     Head: Normocephalic and atraumatic.  Eyes:     Pupils: Pupils are equal, round, and reactive to light.  Cardiovascular:     Rate and Rhythm: Normal rate and regular rhythm.  Pulmonary:     Effort: Pulmonary effort is normal.     Breath sounds: Normal breath sounds.  Abdominal:     Palpations: Abdomen is soft.     Tenderness: There is abdominal tenderness. There is no guarding or rebound.     Comments: Mild tenderness over right upper quadrant left upper quadrant and epigastrium without rebound rigidity guarding  Skin:    General: Skin is warm and dry.  Neurological:     Mental Status: She is alert.  Psychiatric:        Mood and Affect: Mood normal.     (all labs ordered are listed, but only abnormal results are displayed) Labs Reviewed  COMPREHENSIVE METABOLIC PANEL WITH GFR - Abnormal; Notable for the following components:      Result Value   Potassium 3.0 (*)    Glucose, Bld 114 (*)    Calcium  8.8 (*)    Total Bilirubin 1.3 (*)    All other components within normal limits  LIPASE, BLOOD  CBC WITH DIFFERENTIAL/PLATELET  CBC WITH DIFFERENTIAL/PLATELET  TYPE AND SCREEN    EKG: EKG Interpretation Date/Time:  Tuesday September 02 2024 15:39:11 EST Ventricular Rate:  91 PR Interval:  162 QRS Duration:  86 QT Interval:  375 QTC Calculation: 462 R Axis:   81  Text Interpretation: Sinus rhythm Probable left atrial enlargement Abnormal R-wave progression, early transition No significant change since last tracing Confirmed by Dreama Longs ([id number]) on 09/02/2024 4:35:53 PM  Radiology: CT ABDOMEN PELVIS W CONTRAST Result Date: 09/02/2024 CLINICAL DATA:  Possible bowel obstruction. EXAM: CT ABDOMEN AND PELVIS WITH CONTRAST TECHNIQUE: Multidetector CT imaging of the abdomen and  pelvis was performed using the standard protocol following bolus administration of intravenous contrast. RADIATION DOSE REDUCTION: This exam was performed according to the departmental dose-optimization program which includes automated exposure control, adjustment of the mA and/or kV according to patient size and/or use of iterative reconstruction technique. CONTRAST:  75mL OMNIPAQUE  IOHEXOL  350 MG/ML SOLN COMPARISON:  05/24/2024, 09/11/2021, pelvic ultrasound 04/03/2023 FINDINGS: Lower chest: Heart is normal size.  Visualized lung bases are clear. Hepatobiliary: Gallbladder, liver and biliary tree are normal. Pancreas: Normal. Spleen: Normal. Adrenals/Urinary Tract: Left adrenal gland is normal. 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 Kidneys are normal size without hydronephrosis or nephrolithiasis. No focal renal mass. Ureters and bladder are normal. Stomach/Bowel: Stomach and small bowel are normal. Prior appendectomy. Minimal diverticulosis of the  colon without active inflammation. Vascular/Lymphatic: Abdominal aorta is normal in caliber. Remaining vascular structures are unremarkable. Few small periaortic lymph nodes are present. Reproductive: IUD in adequate position. Uterus otherwise unremarkable. Adnexal regions unremarkable. 4.7 cm right ovarian cyst previously evaluated by ultrasound and shown to represent a hemorrhagic cyst. Other: No free peritoneal fluid or focal inflammatory change. Musculoskeletal: No focal abnormality. IMPRESSION: 1. No acute findings in the abdomen/pelvis. 2. Minimal colonic diverticulosis without active inflammation. 3. 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 4. Stable 4.7 cm right ovarian cyst. Electronically Signed   By: Toribio Agreste M.D.   On: 09/02/2024 14:17     Procedures   Medications Ordered in the ED  ondansetron  (ZOFRAN ) 4 MG/2ML injection (  Not Given 09/02/24 1627)  sodium chloride  0.9 % bolus 1,000 mL (1,000 mLs Intravenous New  Bag/Given 09/02/24 1251)  ondansetron  (ZOFRAN ) injection 4 mg (4 mg Intravenous Given 09/02/24 1244)  pantoprazole  (PROTONIX ) injection 40 mg (40 mg Intravenous Given 09/02/24 1241)  morphine  (PF) 4 MG/ML injection 4 mg (4 mg Intravenous Given 09/02/24 1245)  diphenhydrAMINE  (BENADRYL ) injection 25 mg (25 mg Intravenous Given 09/02/24 1248)  iohexol  (OMNIPAQUE ) 350 MG/ML injection 75 mL (75 mLs Intravenous Contrast Given 09/02/24 1401)  potassium chloride  SA (KLOR-CON  M) CR tablet 40 mEq (40 mEq Oral Given 09/02/24 1613)  morphine  (PF) 4 MG/ML injection 4 mg (4 mg Intravenous Given 09/02/24 1613)  ondansetron  (ZOFRAN ) injection 4 mg (4 mg Intravenous Given 09/02/24 1625)  HYDROmorphone  (DILAUDID ) injection 1 mg (1 mg Intravenous Given 09/02/24 1700)    Clinical Course as of 09/02/24 1709  Tue Sep 02, 2024  1630 Laboratory workup shows no leukocytosis, elevation in LFTs or elevation lipase.  No drop in H&H or elevation in BUN.  That would be consistent with significant GI bleeding.  Hypokalemia of 3.0.  CT abdomen pelvis shows no acute findings.  Made patient aware of right ovarian cyst and right adrenal cyst but may require follow-up at a later time.  She states she is still having significant abdominal pain and analgesic effect of morphine  did not last for too long.  LILLETTE Ozell Marine DO, am transitioning care of this patient to the oncoming provider pending reevaluation of pain after dose of Dilaudid  and disposition [MP]    Clinical Course User Index [MP] Marine Ozell LABOR, DO                                 Medical Decision Making 38 year old female with history as above presented to the ED for concern of abdominal pain nausea vomiting and reported hematemesis.  2 days of upper abdominal pain reported hematemesis with dark brown emesis.  Drinks alcohol heavily at times but last drink was over a week ago.  No recent cocaine or other drug use.    Differential diagnosis  includes: Mallory-Weiss tear Upper GI bleeding Acute intra-abdominal infectious/inflammatory process such as appendicitis, diverticulitis, pancreatitis and cholecystitis Recurrent bowel obstruction Urinary tract infection Atypical presentation for pneumonia Viral gastroenteritis Lower suspicion for DKA as initial blood glucose in 130s but will continue to monitor sugars closely  Will obtain laboratory workup including CBC with differential, metabolic panel, lipase and urinalysis along with CT abdomen pelvis Will give Protonix  Zofran  fluids and morphine  for pain control  Amount and/or Complexity of Data Reviewed Labs: ordered. Radiology: ordered.  Risk Prescription drug management.        Final diagnoses:  Epigastric pain  Nausea and vomiting, unspecified vomiting type    ED Discharge Orders     None          Jodi Ozell LABOR, DO 09/02/24 1710

## 2024-09-02 NOTE — ED Notes (Signed)
Pt requesting to be discharged at this time. MD made aware.

## 2024-09-02 NOTE — ED Triage Notes (Signed)
 Pt bib [facility] coming from home with CC of hematemesis x2 days. Pt reports RUQ, LUQ pain that radiates to back. GCS 15.   EMS VS: 118/80 84 HR 98% 134cbg

## 2024-09-02 NOTE — ED Notes (Signed)
 IV team at bedside

## 2024-09-02 NOTE — ED Notes (Addendum)
 Pt reporting she feels itchy, states she had hx of contrast dye allergy 12 years ago- CT scan was completed 2 hours ago. Pt requesting benadryl  at this time. No redness, rash, tongue or facial swelling noted. MD made aware, awaiting further orders. Pt remains on continuous cardiac monitoring. VSS.

## 2024-09-29 ENCOUNTER — Other Ambulatory Visit: Payer: Self-pay

## 2024-09-29 ENCOUNTER — Emergency Department (HOSPITAL_COMMUNITY)

## 2024-09-29 ENCOUNTER — Emergency Department (HOSPITAL_COMMUNITY)
Admission: EM | Admit: 2024-09-29 | Discharge: 2024-09-29 | Disposition: A | Discharge status: Home / Self Care | Attending: Emergency Medicine | Admitting: Emergency Medicine

## 2024-09-29 ENCOUNTER — Encounter (HOSPITAL_COMMUNITY): Payer: Self-pay

## 2024-09-29 DIAGNOSIS — N3000 Acute cystitis without hematuria: Secondary | ICD-10-CM

## 2024-09-29 DIAGNOSIS — M545 Low back pain, unspecified: Secondary | ICD-10-CM

## 2024-09-29 DIAGNOSIS — W19XXXA Unspecified fall, initial encounter: Secondary | ICD-10-CM

## 2024-09-29 LAB — URINALYSIS, ROUTINE W REFLEX MICROSCOPIC
Bilirubin Urine: NEGATIVE
Glucose, UA: NEGATIVE mg/dL
Hgb urine dipstick: NEGATIVE
Ketones, ur: 5 mg/dL — AB
Nitrite: NEGATIVE
Protein, ur: 100 mg/dL — AB
Specific Gravity, Urine: 1.027 (ref 1.005–1.030)
pH: 5 (ref 5.0–8.0)

## 2024-09-29 LAB — POC URINE PREG, ED: Preg Test, Ur: NEGATIVE

## 2024-09-29 MED ORDER — METHOCARBAMOL 500 MG PO TABS
500.0000 mg | ORAL_TABLET | Freq: Once | ORAL | Status: AC
  Administered 2024-09-29: 500 mg via ORAL
  Filled 2024-09-29: qty 1

## 2024-09-29 MED ORDER — HYDROMORPHONE HCL 1 MG/ML IJ SOLN
1.0000 mg | Freq: Once | INTRAMUSCULAR | Status: AC
  Administered 2024-09-29: 1 mg via INTRAMUSCULAR
  Filled 2024-09-29: qty 1

## 2024-09-29 MED ORDER — PREDNISONE 50 MG PO TABS: ORAL_TABLET | ORAL | 0 refills | Status: AC

## 2024-09-29 MED ORDER — METHOCARBAMOL 500 MG PO TABS: 500.0000 mg | ORAL_TABLET | Freq: Four times a day (QID) | ORAL | 0 refills | Status: AC

## 2024-09-29 MED ORDER — METHYLPREDNISOLONE SODIUM SUCC 125 MG IJ SOLR
125.0000 mg | Freq: Once | INTRAMUSCULAR | Status: AC
  Administered 2024-09-29: 125 mg via INTRAMUSCULAR
  Filled 2024-09-29: qty 2

## 2024-09-29 MED ORDER — SULFAMETHOXAZOLE-TRIMETHOPRIM 800-160 MG PO TABS: 1.0000 | ORAL_TABLET | Freq: Two times a day (BID) | ORAL | 0 refills | Status: AC
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
## 2024-09-29 NOTE — ED Notes (Signed)
 Pregnancy urine POC resulted and lab said it was not crossing over but the results are negative

## 2024-09-29 NOTE — ED Notes (Signed)
 POC Urine Pregnancy negative. KIT

## 2024-09-29 NOTE — Discharge Instructions (Signed)
 Follow up with your Physician for recheck
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
## 2024-10-21 ENCOUNTER — Emergency Department (HOSPITAL_COMMUNITY)

## 2024-10-21 ENCOUNTER — Emergency Department (HOSPITAL_COMMUNITY)
Admission: EM | Admit: 2024-10-21 | Discharge: 2024-10-21 | Disposition: A | Discharge status: Home / Self Care | Attending: Emergency Medicine | Admitting: Emergency Medicine

## 2024-10-21 DIAGNOSIS — R0600 Dyspnea, unspecified: Secondary | ICD-10-CM | POA: Diagnosis not present

## 2024-10-21 DIAGNOSIS — R109 Unspecified abdominal pain: Secondary | ICD-10-CM | POA: Diagnosis present

## 2024-10-21 DIAGNOSIS — Z79899 Other long term (current) drug therapy: Secondary | ICD-10-CM | POA: Diagnosis not present

## 2024-10-21 DIAGNOSIS — E876 Hypokalemia: Secondary | ICD-10-CM | POA: Diagnosis not present

## 2024-10-21 DIAGNOSIS — R509 Fever, unspecified: Secondary | ICD-10-CM | POA: Diagnosis not present

## 2024-10-21 DIAGNOSIS — F17210 Nicotine dependence, cigarettes, uncomplicated: Secondary | ICD-10-CM | POA: Insufficient documentation

## 2024-10-21 DIAGNOSIS — R000 Tachycardia, unspecified: Secondary | ICD-10-CM | POA: Insufficient documentation

## 2024-10-21 DIAGNOSIS — N179 Acute kidney failure, unspecified: Secondary | ICD-10-CM | POA: Diagnosis not present

## 2024-10-21 DIAGNOSIS — E1110 Type 2 diabetes mellitus with ketoacidosis without coma: Secondary | ICD-10-CM | POA: Diagnosis not present

## 2024-10-21 DIAGNOSIS — R1084 Generalized abdominal pain: Secondary | ICD-10-CM

## 2024-10-21 DIAGNOSIS — E1165 Type 2 diabetes mellitus with hyperglycemia: Secondary | ICD-10-CM | POA: Insufficient documentation

## 2024-10-21 LAB — URINALYSIS, ROUTINE W REFLEX MICROSCOPIC
Bilirubin Urine: NEGATIVE
Glucose, UA: 50 mg/dL — AB
Hgb urine dipstick: NEGATIVE
Ketones, ur: 5 mg/dL — AB
Leukocytes,Ua: NEGATIVE
Nitrite: NEGATIVE
Protein, ur: 100 mg/dL — AB
Specific Gravity, Urine: 1.028 (ref 1.005–1.030)
pH: 5 (ref 5.0–8.0)

## 2024-10-21 LAB — COMPREHENSIVE METABOLIC PANEL WITH GFR
ALT: 20 U/L (ref 0–44)
AST: 24 U/L (ref 15–41)
Albumin: 4.5 g/dL (ref 3.5–5.0)
Alkaline Phosphatase: 91 U/L (ref 38–126)
Anion gap: 16 — ABNORMAL HIGH (ref 5–15)
BUN: 18 mg/dL (ref 6–20)
CO2: 23 mmol/L (ref 22–32)
Calcium: 9.5 mg/dL (ref 8.9–10.3)
Chloride: 100 mmol/L (ref 98–111)
Creatinine, Ser: 1.18 mg/dL — ABNORMAL HIGH (ref 0.44–1.00)
GFR, Estimated: 60 mL/min — ABNORMAL LOW
Glucose, Bld: 290 mg/dL — ABNORMAL HIGH (ref 70–99)
Potassium: 3.1 mmol/L — ABNORMAL LOW (ref 3.5–5.1)
Sodium: 138 mmol/L (ref 135–145)
Total Bilirubin: 0.9 mg/dL (ref 0.0–1.2)
Total Protein: 8 g/dL (ref 6.5–8.1)

## 2024-10-21 LAB — I-STAT VENOUS BLOOD GAS, ED
Acid-Base Excess: 1 mmol/L (ref 0.0–2.0)
Bicarbonate: 26.3 mmol/L (ref 20.0–28.0)
Calcium, Ion: 1.06 mmol/L — ABNORMAL LOW (ref 1.15–1.40)
HCT: 39 % (ref 36.0–46.0)
Hemoglobin: 13.3 g/dL (ref 12.0–15.0)
O2 Saturation: 99 %
Potassium: 3.1 mmol/L — ABNORMAL LOW (ref 3.5–5.1)
Sodium: 140 mmol/L (ref 135–145)
TCO2: 28 mmol/L (ref 22–32)
pCO2, Ven: 41.9 mmHg — ABNORMAL LOW (ref 44–60)
pH, Ven: 7.405 (ref 7.25–7.43)
pO2, Ven: 127 mmHg — ABNORMAL HIGH (ref 32–45)

## 2024-10-21 LAB — RESP PANEL BY RT-PCR (RSV, FLU A&B, COVID)  RVPGX2
Influenza A by PCR: NEGATIVE
Influenza B by PCR: NEGATIVE
Resp Syncytial Virus by PCR: NEGATIVE
SARS Coronavirus 2 by RT PCR: NEGATIVE

## 2024-10-21 LAB — CBC
HCT: 43.9 % (ref 36.0–46.0)
Hemoglobin: 14.7 g/dL (ref 12.0–15.0)
MCH: 29.8 pg (ref 26.0–34.0)
MCHC: 33.5 g/dL (ref 30.0–36.0)
MCV: 89 fL (ref 80.0–100.0)
Platelets: 398 K/uL (ref 150–400)
RBC: 4.93 MIL/uL (ref 3.87–5.11)
RDW: 13.5 % (ref 11.5–15.5)
WBC: 6.4 K/uL (ref 4.0–10.5)
nRBC: 0 % (ref 0.0–0.2)

## 2024-10-21 LAB — URINE DRUG SCREEN
Amphetamines: NEGATIVE
Barbiturates: NEGATIVE
Benzodiazepines: NEGATIVE
Cocaine: POSITIVE — AB
Fentanyl: NEGATIVE
Methadone Scn, Ur: NEGATIVE
Opiates: POSITIVE — AB
Tetrahydrocannabinol: NEGATIVE

## 2024-10-21 LAB — LIPASE, BLOOD: Lipase: 16 U/L (ref 11–51)

## 2024-10-21 LAB — I-STAT CG4 LACTIC ACID, ED: Lactic Acid, Venous: 1.8 mmol/L (ref 0.5–1.9)

## 2024-10-21 LAB — HCG, SERUM, QUALITATIVE: Preg, Serum: NEGATIVE

## 2024-10-21 LAB — BETA-HYDROXYBUTYRIC ACID: Beta-Hydroxybutyric Acid: 0.19 mmol/L (ref 0.05–0.27)

## 2024-10-21 MED ORDER — DICYCLOMINE HCL 10 MG PO CAPS
10.0000 mg | ORAL_CAPSULE | Freq: Once | ORAL | Status: AC
  Administered 2024-10-21: 10 mg via ORAL
  Filled 2024-10-21: qty 1

## 2024-10-21 MED ORDER — PANTOPRAZOLE SODIUM 40 MG IV SOLR
40.0000 mg | Freq: Once | INTRAVENOUS | Status: AC
  Administered 2024-10-21: 40 mg via INTRAVENOUS
  Filled 2024-10-21: qty 10

## 2024-10-21 MED ORDER — POTASSIUM CHLORIDE 10 MEQ/100ML IV SOLN
10.0000 meq | INTRAVENOUS | Status: AC
  Administered 2024-10-21 (×4): 10 meq via INTRAVENOUS
  Filled 2024-10-21 (×4): qty 100

## 2024-10-21 MED ORDER — OXYCODONE-ACETAMINOPHEN 5-325 MG PO TABS
1.0000 | ORAL_TABLET | Freq: Once | ORAL | Status: AC
  Administered 2024-10-21: 1 via ORAL
  Filled 2024-10-21: qty 1

## 2024-10-21 MED ORDER — ONDANSETRON HCL 4 MG PO TABS: 4.0000 mg | ORAL_TABLET | Freq: Four times a day (QID) | ORAL | 0 refills | Status: AC

## 2024-10-21 MED ORDER — PROCHLORPERAZINE EDISYLATE 10 MG/2ML IJ SOLN: 10.0000 mg | Freq: Once | INTRAMUSCULAR | Status: DC

## 2024-10-21 MED ORDER — MORPHINE SULFATE (PF) 2 MG/ML IV SOLN
2.0000 mg | Freq: Once | INTRAVENOUS | Status: AC
  Administered 2024-10-21: 2 mg via INTRAVENOUS
  Filled 2024-10-21: qty 1

## 2024-10-21 MED ORDER — OXYCODONE HCL 5 MG PO TABS
5.0000 mg | ORAL_TABLET | ORAL | Status: AC
  Administered 2024-10-21: 5 mg via ORAL
  Filled 2024-10-21: qty 1

## 2024-10-21 MED ORDER — HYDROMORPHONE HCL 1 MG/ML IJ SOLN
0.5000 mg | Freq: Once | INTRAMUSCULAR | Status: AC
  Administered 2024-10-21: 0.5 mg via INTRAVENOUS
  Filled 2024-10-21: qty 1

## 2024-10-21 MED ORDER — FAMOTIDINE 20 MG PO TABS
40.0000 mg | ORAL_TABLET | Freq: Once | ORAL | Status: AC
  Administered 2024-10-21: 40 mg via ORAL
  Filled 2024-10-21: qty 2

## 2024-10-21 MED ORDER — METOCLOPRAMIDE HCL 5 MG/ML IJ SOLN
10.0000 mg | Freq: Once | INTRAMUSCULAR | Status: AC
  Administered 2024-10-21: 10 mg via INTRAVENOUS
  Filled 2024-10-21: qty 2

## 2024-10-21 MED ORDER — LACTATED RINGERS IV BOLUS
1000.0000 mL | Freq: Once | INTRAVENOUS | Status: AC
  Administered 2024-10-21: 1000 mL via INTRAVENOUS

## 2024-10-21 MED ORDER — ALUM & MAG HYDROXIDE-SIMETH 200-200-20 MG/5ML PO SUSP
30.0000 mL | Freq: Once | ORAL | Status: AC
  Administered 2024-10-21: 30 mL via ORAL
  Filled 2024-10-21: qty 30

## 2024-10-21 MED ORDER — ONDANSETRON HCL 4 MG/2ML IJ SOLN
4.0000 mg | Freq: Once | INTRAMUSCULAR | Status: AC
  Administered 2024-10-21: 4 mg via INTRAVENOUS
  Filled 2024-10-21: qty 2

## 2024-10-21 NOTE — ED Provider Triage Note (Signed)
 Emergency Medicine Provider Triage Evaluation Note  Jodi Mcgee , a 38 y.o. female  was evaluated in triage.  Pt complains of generalized abdominal pain with nausea.  She believes it may be a flare of her underlying Crohn's disease  Review of Systems  Positive:  Negative:   Physical Exam  BP 97/65 (BP Location: Right Arm)   Pulse 100   Temp 98.2 F (36.8 C) (Oral)   Resp 18   SpO2 100%  Gen:   Awake, no distress   Resp:  Normal effort  MSK:   Moves extremities without difficulty  Other:    Medical Decision Making  Medically screening exam initiated at 1:19 AM.  Appropriate orders placed.  Jodi Mcgee was informed that the remainder of the evaluation will be completed by another provider, this initial triage assessment does not replace that evaluation, and the importance of remaining in the ED until their evaluation is complete.     Logan Ubaldo NOVAK, PA-C 10/21/24 0120

## 2024-10-21 NOTE — ED Provider Notes (Cosign Needed Addendum)
 " [facility] Provider Note   HPI/ROS    History obtained from patient.  Jodi Mcgee is a 38 y.o. female who presents for Abdominal Pain and who  has a past medical history of Anemia, Anxiety, Blood transfusion without reported diagnosis, Cocaine abuse (HCC), Diabetes mellitus without complication (HCC), GERD (gastroesophageal reflux disease), IBS (irritable bowel syndrome), Ovarian cyst, Peritonitis, acute generalized (HCC), Pulmonary embolism (HCC), SBO (small bowel obstruction) (HCC), and Sepsis (HCC).  Patient presents today for 2 days of abdominal pain and recurrent emesis at home.  Patient states that he feels like a Crohn's flare.  States she has been without her Crohn's medications at home for the last 4 to 5 days because the pharmacy has been closed.  She thinks that it may be a Crohn's flare because she is not supposed to eat red meat and had steak 2 days ago.  Has also been drinking, which she says can mess with her Crohn's.  Endorses some blood streaks in her vomiting that started after having many episodes of vomiting.  Denies any gross hematemesis.  Denies any hematochezia or melena.  Does endorse some hematuria, and states she never finished her antibiotics for her recent UTI because she was drinking and did not want them to interact.  States she has been off of her diabetes medication for some time, but endorses a history of DKA.  Currently denying chest pain, but endorsing fevers and chills as well as some shortness of breath after vomiting.  MDM   I have reviewed the nursing documentation, vital signs, as well as the past medical history, surgical history, family history, and social history.  Initial Assessment:  Patient hemodynamically stable on initial evaluation.  Mildly tachycardic and does have some signs of kussmaul breathing.  Unsure whether this is just anxiety secondary to recurrent emesis and nausea or signs of DKA.  Labs  obtained in first look with no significant leukocytosis or anemia, no thrombocytopenia.  CMP with mild hypokalemia with mild AKI as well.  Mild hyperglycemia to 298 and an elevated anion gap to 16, which is likely secondary to hypokalemia.  Otherwise no significantly elevated BUN which would make up this.  Bicarb within normal limits.  Pregnancy test negative.  Will obtain beta hydroxybutyrate, VBG, UDS, COVID flu, lactic, and try to get the patient to complete UA.  Does have tenderness over the flank without signs of pyelonephritis on CT imaging.  It was a noncontrasted CT though so limited evaluation for this.    Will also add on chest x-ray and EKG given patient is mildly tachycardic here endorsing some dyspnea.  No clinical signs of DVT on exam.  No prior history of ACS.  Will also give patient several runs potassium for hypokalemia, Reglan  for nausea, Dilaudid  for pain, and LR bolus for AKI.  Feel that AKI is likely prerenal.  Patient's abdominal pain could also be gastritis/gastroenteritis.  Will give Maalox, Pepcid , and Protonix  given patient had questionable GI bleed.  Hemoglobin stable and hemodynamically stable, so doubt significant GI bleed and no history of varices.  Likely just has Mallory-Weiss tear secondary to recurrent emesis.  On my reevaluation patient sleeping comfortably in bed.  Patient now tolerating p.o. after medications here.  Instructed to use her home oxycodone  at home for recurrent abdominal pain.  Given unremarkable workup thus far feel patient stable for discharge.  Unclear etiology of abdominal pain at this time, but did instruct patient to stop  using substances at home likely cocaine.  Was also instructed to try to follow a more strict diet regimen as this could have been a Crohn's flare.  Patient discharged home in hemodynamically stable condition with strict return precautions.  She was given a prescription for Zofran  at discharge.   Disposition:  I discussed the plan for  discharge with the patient and/or their surrogate at bedside prior to discharge and they were in agreement with the plan and verbalized understanding of the return precautions provided. All questions answered to the best of my ability. Ultimately, the patient was discharged in stable condition with stable vital signs. I am reassured that they are capable of close follow up and good social support at home.     This patient was staffed with Dr. Laurice who supervised the visit and agreed with the plan of care.   Due to the patients current presenting symptoms, physical exam findings, and the workup stated above, it is thought that the etiology of the patients current presentation is: No diagnosis found.   Clinical Complexity A medically appropriate history, review of systems, and physical exam was performed.  Factors that affect the complexity of this encounter: assessment of correct protocol, laboratory work from this visit, and review of echocardiogram/EKG results  My independent interpretations of diagnostic studies are documented in the ED course above.   If decision rules were used in this patient's evaluation, they are listed below.   Click here for ABCD2, HEART and other calculators  Patient's presentation is most consistent with acute complicated illness / injury requiring diagnostic workup.  MDM generated using voice dictation software and may contain dictation errors. Please contact me for any clarification or with any questions.    Physical Exam, PMH, PSH, Family History, and Social Hsitory   Vitals:   10/21/24 0016 10/21/24 0150 10/21/24 0419 10/21/24 0822  BP: 97/65 91/65 (!) 134/111 108/84  Pulse: 100 77 86 94  Resp: 18  20 17   Temp: 98.2 F (36.8 C)  98.5 F (36.9 C) 98.1 F (36.7 C)  TempSrc: Oral  Oral   SpO2: 100%  99% 100%    Physical Exam Constitutional:      Appearance: She is well-developed.     Comments: Anxious and tachypneic, appears to be kussmaul  breathing  HENT:     Head: Normocephalic and atraumatic.  Cardiovascular:     Rate and Rhythm: Regular rhythm. Tachycardia present.  Pulmonary:     Effort: Tachypnea present.     Breath sounds: No decreased breath sounds, wheezing or rhonchi.  Abdominal:     General: Abdomen is flat. There is no distension.     Palpations: Abdomen is soft.     Tenderness: There is generalized abdominal tenderness. There is left CVA tenderness. There is no right CVA tenderness, guarding or rebound.  Skin:    General: Skin is warm and dry.  Neurological:     General: No focal deficit present.     Mental Status: She is alert and oriented to person, place, and time.     GCS: GCS eye subscore is 4. GCS verbal subscore is 5. GCS motor subscore is 6.     Comments: Extremity spontaneously     Past Medical History:  Diagnosis Date   Anemia    Anxiety    Blood transfusion without reported diagnosis    both CS   Cocaine abuse (HCC)    Diabetes mellitus without complication (HCC)    GERD (gastroesophageal reflux disease)  has resolved   IBS (irritable bowel syndrome)    Ovarian cyst    Peritonitis, acute generalized (HCC)    Pulmonary embolism (HCC)    SBO (small bowel obstruction) (HCC)    Sepsis (HCC)      Past Surgical History:  Procedure Laterality Date   APPENDECTOMY     ruptured    BIOPSY  10/27/2021   Procedure: BIOPSY;  Surgeon: Dianna Specking, MD;  Location: [facility];  Service: Endoscopy;;   BUNIONECTOMY     CESAREAN SECTION     CESAREAN SECTION Bilateral 11/20/2017   Procedure: CESAREAN SECTION;  Surgeon: Nicholaus Burnard HERO, MD;  Location: [facility];  Service: Obstetrics;  Laterality: Bilateral;   COLONOSCOPY WITH PROPOFOL  N/A 10/27/2021   Procedure: COLONOSCOPY WITH PROPOFOL ;  Surgeon: Dianna Specking, MD;  Location: [facility];  Service: Endoscopy;  Laterality: N/A;   OVARIAN CYST DRAINAGE     SMALL BOWEL REPAIR       Family History  Problem Relation Age of Onset    Hypertension Mother    Anemia Mother    Diabetes Father     Social History   Tobacco Use   Smoking status: Every Day    Current packs/day: 0.15    Types: Cigarettes   Smokeless tobacco: Never   Tobacco comments:    social use with tobacco  Substance Use Topics   Alcohol use: Yes    Comment: occ     Procedures   If procedures were preformed on this patient, they are listed below:  .Ultrasound ED Peripheral IV (Provider)  Date/Time: 10/21/2024 5:39 PM  Performed by: Guillermina Hamilton, MD Authorized by: Laurice Maude BROCKS, MD   Procedure details:    Indications: multiple failed IV attempts     Skin Prep: chlorhexidine  gluconate     Location: R brachial vein.   Angiocath:  18 G   Bedside Ultrasound Guided: Yes     Images: not archived     Patient tolerated procedure without complications: Yes     Dressing applied: Yes      Electronically signed by:   Hamilton Carlin Guillermina, M.D. PGY-2, Emergency Medicine   Please note that this documentation was produced with the assistance of voice-to-text technology and may contain errors.    Guillermina Hamilton, MD 10/21/24 7944    Guillermina Hamilton, MD 10/21/24 2108  "

## 2024-10-21 NOTE — Discharge Instructions (Addendum)
 You were seen today for abdominal pain. While you were here we monitored your vitals, preformed a physical exam, and gave medications. These were all reassuring and there is no indication for any further testing or intervention in the emergency department at this time.   Things to do:  - Follow up with your primary care provider within the next 1-2 weeks - We wrote your prescription for Zofran , you can use it every 4-6 hours as needed for ongoing nausea/vomiting  Return to the emergency department if you have any new or worsening symptoms including recurrent abdominal pain, worsening nausea and vomiting, inability tolerate p.o., or if you have any other concerns.

## 2024-10-21 NOTE — ED Triage Notes (Signed)
 Brought by [facility], history of Crohn's, having a flare up since three days ago after eating steak.   Pt had also a UTI and didn't finished the antibiotics. Complaints of hematuria and dysuria.   bp 134 /90 98SPO2 102 HR CBG 232   Pt report nausea, hematemesis and vomiting. Aox4

## 2024-11-06 ENCOUNTER — Encounter (HOSPITAL_BASED_OUTPATIENT_CLINIC_OR_DEPARTMENT_OTHER): Payer: Self-pay

## 2024-11-06 ENCOUNTER — Emergency Department (HOSPITAL_BASED_OUTPATIENT_CLINIC_OR_DEPARTMENT_OTHER)

## 2024-11-06 ENCOUNTER — Other Ambulatory Visit: Payer: Self-pay

## 2024-11-06 ENCOUNTER — Emergency Department (HOSPITAL_BASED_OUTPATIENT_CLINIC_OR_DEPARTMENT_OTHER)
Admission: EM | Admit: 2024-11-06 | Discharge: 2024-11-06 | Disposition: A | Discharge status: Home / Self Care | Attending: Emergency Medicine | Admitting: Emergency Medicine

## 2024-11-06 DIAGNOSIS — R1023 Pelvic and perineal pain bilateral: Secondary | ICD-10-CM | POA: Insufficient documentation

## 2024-11-06 DIAGNOSIS — A599 Trichomoniasis, unspecified: Secondary | ICD-10-CM | POA: Insufficient documentation

## 2024-11-06 DIAGNOSIS — R102 Pelvic and perineal pain unspecified side: Secondary | ICD-10-CM

## 2024-11-06 LAB — CBC
HCT: 40.3 % (ref 36.0–46.0)
Hemoglobin: 13.1 g/dL (ref 12.0–15.0)
MCH: 29.5 pg (ref 26.0–34.0)
MCHC: 32.5 g/dL (ref 30.0–36.0)
MCV: 90.8 fL (ref 80.0–100.0)
Platelets: 250 K/uL (ref 150–400)
RBC: 4.44 MIL/uL (ref 3.87–5.11)
RDW: 14.4 % (ref 11.5–15.5)
WBC: 7.1 K/uL (ref 4.0–10.5)
nRBC: 0 % (ref 0.0–0.2)

## 2024-11-06 LAB — COMPREHENSIVE METABOLIC PANEL WITH GFR
ALT: 16 U/L (ref 0–44)
AST: 19 U/L (ref 15–41)
Albumin: 4.6 g/dL (ref 3.5–5.0)
Alkaline Phosphatase: 88 U/L (ref 38–126)
Anion gap: 13 (ref 5–15)
BUN: 13 mg/dL (ref 6–20)
CO2: 24 mmol/L (ref 22–32)
Calcium: 9.1 mg/dL (ref 8.9–10.3)
Chloride: 105 mmol/L (ref 98–111)
Creatinine, Ser: 0.87 mg/dL (ref 0.44–1.00)
GFR, Estimated: >60 mL/min
Glucose, Bld: 107 mg/dL — ABNORMAL HIGH (ref 70–99)
Potassium: 3.7 mmol/L (ref 3.5–5.1)
Sodium: 142 mmol/L (ref 135–145)
Total Bilirubin: 0.5 mg/dL (ref 0.0–1.2)
Total Protein: 7.9 g/dL (ref 6.5–8.1)

## 2024-11-06 LAB — WET PREP, GENITAL
Clue Cells Wet Prep HPF POC: NONE SEEN
Sperm: NONE SEEN
WBC, Wet Prep HPF POC: <10
Yeast Wet Prep HPF POC: NONE SEEN

## 2024-11-06 LAB — URINALYSIS, ROUTINE W REFLEX MICROSCOPIC
Bacteria, UA: NONE SEEN
Bilirubin Urine: NEGATIVE
Glucose, UA: NEGATIVE mg/dL
Hgb urine dipstick: NEGATIVE
Ketones, ur: NEGATIVE mg/dL
Leukocytes,Ua: NEGATIVE
Nitrite: NEGATIVE
Specific Gravity, Urine: 1.03 (ref 1.005–1.030)
pH: 6 (ref 5.0–8.0)

## 2024-11-06 LAB — HCG, SERUM, QUALITATIVE: Preg, Serum: NEGATIVE

## 2024-11-06 LAB — LIPASE, BLOOD: Lipase: 26 U/L (ref 11–51)

## 2024-11-06 MED ORDER — ONDANSETRON HCL 4 MG/2ML IJ SOLN
4.0000 mg | Freq: Once | INTRAMUSCULAR | Status: AC
  Administered 2024-11-06: 4 mg via INTRAVENOUS
  Filled 2024-11-06: qty 2

## 2024-11-06 MED ORDER — METRONIDAZOLE 500 MG PO TABS
500.0000 mg | ORAL_TABLET | Freq: Once | ORAL | Status: AC
  Administered 2024-11-06: 500 mg via ORAL
  Filled 2024-11-06: qty 1

## 2024-11-06 MED ORDER — MORPHINE SULFATE (PF) 4 MG/ML IV SOLN
4.0000 mg | Freq: Once | INTRAVENOUS | Status: AC
  Administered 2024-11-06: 4 mg via INTRAVENOUS
  Filled 2024-11-06: qty 1

## 2024-11-06 MED ORDER — METRONIDAZOLE 500 MG PO TABS: 500.0000 mg | ORAL_TABLET | Freq: Two times a day (BID) | ORAL | 0 refills | Status: AC

## 2024-11-06 MED ORDER — DIPHENHYDRAMINE HCL 50 MG/ML IJ SOLN
25.0000 mg | Freq: Once | INTRAMUSCULAR | Status: AC
  Administered 2024-11-06: 25 mg via INTRAVENOUS
  Filled 2024-11-06: qty 1

## 2024-11-06 NOTE — ED Notes (Signed)
 ED Provider at bedside.

## 2024-11-06 NOTE — Discharge Instructions (Addendum)
 You tested positive for trichomonas.  I have sent you a prescription for metronidazole , which is an antibiotic that will help with the symptoms.  Take 500 mg 2 times per day for 1 week.  Follow-up with your primary care doctor.  You will be contacted if your gonorrhea and Chlamydia tests are positive.

## 2024-11-06 NOTE — ED Triage Notes (Signed)
 Pt presents via POV c/o lower back pain, bilateral flank pain, and pelvic pressure. Reports hx of ovarian cyst.

## 2024-11-06 NOTE — ED Provider Notes (Signed)
 "  [facility]  Provider Note  CSN: [id number] Arrival date & time: 11/06/24 0419  History Chief Complaint  Patient presents with   Pelvic Pain    Jodi Mcgee is a 39 y.o. female with history of chronic back and abdominal pain on oxycodone  TID here for about a week of pelvic pain/pressure, radiating into her lower back/flank bilaterally. Some urinary hesitancy. No hematuria, no vaginal bleeding. No fever. No vomiting. Pain not controlled with home oxycodone  and she reports this pain is different from her chronic pain.    Home Medications Prior to Admission medications  Medication Sig Start Date End Date Taking? Authorizing Provider  acetaminophen  (TYLENOL ) 500 MG tablet Take 1,000 mg by mouth every 6 (six) hours as needed for moderate pain (pain score 4-6).    [provider]  Aspirin -Acetaminophen -Caffeine  (GOODY HEADACHE PO) Take 1 packet by mouth once.    [provider]  blood glucose meter kit and supplies KIT Dispense based on patient and insurance preference. Use up to four times daily as directed. (FOR ICD-9 250.00, 250.01). 02/21/19   Briana Avena D, MD  dicyclomine  (BENTYL ) 20 MG tablet Take 1 tablet (20 mg total) by mouth 2 (two) times daily. 09/02/24   Dreama Longs, MD  hyoscyamine  (LEVSIN  SL) 0.125 MG SL tablet Place 1 tablet (0.125 mg total) under the tongue every 4 (four) hours as needed for cramping (abdominal pain). Patient not taking: Reported on 09/02/2024 01/26/24   Haze Lonni PARAS, MD  Levonorgestrel  (LILETTA ) 19.5 MCG/DAY IUD IUD 1 each by Intrauterine route continuous. Placed in 2019    [provider]  methocarbamol  (ROBAXIN ) 500 MG tablet Take 1 tablet (500 mg total) by mouth 4 (four) times daily. 09/29/24   Sofia, Leslie K, PA-C  ondansetron  (ZOFRAN ) 4 MG tablet Take 1 tablet (4 mg total) by mouth every 6 (six) hours. 10/21/24   Guillermina Hamilton, MD  ondansetron  (ZOFRAN -ODT) 4 MG  disintegrating tablet Take 1 tablet (4 mg total) by mouth every 8 (eight) hours as needed for nausea or vomiting. 09/02/24   Dreama Longs, MD  oxyCODONE -acetaminophen  (PERCOCET/ROXICET) 5-325 MG tablet Take 1 tablet by mouth every 8 (eight) hours as needed for severe pain (pain score 7-10). 05/24/24   Barrett, Jamie N, PA-C  Phenylephrine -Pheniramine-DM (THERAFLU COLD & COUGH PO) Take 1 packet by mouth See admin instructions. In 8 oz in hot water    [provider]  predniSONE  (DELTASONE ) 50 MG tablet One tablet a day for 5 days 09/29/24   Sofia, Leslie K, PA-C  pregabalin (LYRICA) 225 MG capsule Take 225 mg by mouth 2 (two) times daily. 08/12/24   [provider]  triamcinolone  ointment (KENALOG ) 0.1 % Apply 1 Application topically 2 (two) times daily. 07/11/24 07/11/25  [provider]     Allergies    Iodinated contrast media, Cephalexin , Cetirizine & related, Toradol  [ketorolac  tromethamine ], and Nsaids   Review of Systems   Review of Systems Please see HPI for pertinent positives and negatives  Physical Exam BP (!) 175/100   Pulse (!) 113   Temp 98.2 F (36.8 C) (Oral)   Resp 18   SpO2 100%   Physical Exam Vitals and nursing note reviewed.  Constitutional:      Appearance: Normal appearance.  HENT:     Head: Normocephalic and atraumatic.     Nose: Nose normal.     Mouth/Throat:     Mouth: Mucous membranes are moist.  Eyes:  Extraocular Movements: Extraocular movements intact.     Conjunctiva/sclera: Conjunctivae normal.  Cardiovascular:     Rate and Rhythm: Normal rate.  Pulmonary:     Effort: Pulmonary effort is normal.     Breath sounds: Normal breath sounds.  Abdominal:     General: Abdomen is flat.     Palpations: Abdomen is soft.     Tenderness: There is no abdominal tenderness. There is no guarding.  Musculoskeletal:        General: No swelling. Normal range of motion.     Cervical back: Neck supple.  Skin:    General: Skin is  warm and dry.  Neurological:     General: No focal deficit present.     Mental Status: She is alert.  Psychiatric:        Mood and Affect: Mood normal.     ED Results / Procedures / Treatments   EKG None  Procedures Procedures  Medications Ordered in the ED Medications  morphine  (PF) 4 MG/ML injection 4 mg (has no administration in time range)  morphine  (PF) 4 MG/ML injection 4 mg (4 mg Intravenous Given 11/06/24 0509)  ondansetron  (ZOFRAN ) injection 4 mg (4 mg Intravenous Given 11/06/24 0508)    Initial Impression and Plan  Patient here with pelvic pain, has a known history of ovarian cysts. Abdominal exam is benign. She has had numerous previous ED visits for abdominal pain which are different from her symptoms this visit. She had CT abdomen/pelvis x 5 in the last year. Will check labs and send for US  to eval torsion/hemorrhagic cyst. Pain/nausea meds for comfort.   ED Course   Clinical Course as of 11/06/24 0706  Thu Nov 06, 2024  0518 CBC is normal. [CS]  0541 CMP, lipase and HCG neg. [CS]  9394 UA neg for infection. Some crystals but CT from 2 weeks ago neg for stones, doubt renal colic as cause of her symptoms.  [CS]  [id number] I personally viewed the images from radiology studies and agree with radiologist interpretation: US  without signs of torsion, R ovarian cyst similar to previous.  [CS]  [id number] Patient now requesting STI screening. Denies vaginal symptoms but 'just wants to be sure'. Will add self-swab samples and anticipate discharge when wet prep is resulted.  [CS]  [id number] Care signed out pending wet prep.  [CS]    Clinical Course User Index [CS] Roselyn Carlin NOVAK, MD     MDM Rules/Calculators/A&P Medical Decision Making Problems Addressed: Pelvic pain: acute illness or injury  Amount and/or Complexity of Data Reviewed Labs: ordered. Decision-making details documented in ED Course. Radiology: ordered and independent interpretation performed. Decision-making  details documented in ED Course.  Risk Prescription drug management. Parenteral controlled substances.     Final Clinical Impression(s) / ED Diagnoses Final diagnoses:  Pelvic pain    Rx / DC Orders ED Discharge Orders     None        Roselyn Carlin NOVAK, MD 11/06/24 [phone]  "

## 2024-11-06 NOTE — ED Provider Notes (Signed)
 Received patient at signout, signed out pending wet prep.  Wet prep did show trichomonas.  Discussed results with patient at bedside.  Sent prescription for metronidazole .  Informed her we would contact her if additional test results were positive.   Mannie Pac T, DO 11/06/24 [phone]

## 2024-11-07 LAB — GC/CHLAMYDIA PROBE AMP (~~LOC~~) NOT AT ARMC
Chlamydia: NEGATIVE
Comment: NEGATIVE
Comment: NORMAL
Neisseria Gonorrhea: NEGATIVE
